# Patient Record
Sex: Female | Born: 1935 | Race: White | Hispanic: No | Smoking: Never smoker
Health system: Southern US, Community
[De-identification: ages and names within clinical notes are randomized; demographics above are authoritative.]

## PROBLEM LIST (undated history)

## (undated) DIAGNOSIS — M1990 Unspecified osteoarthritis, unspecified site: Secondary | ICD-10-CM

## (undated) DIAGNOSIS — K219 Gastro-esophageal reflux disease without esophagitis: Secondary | ICD-10-CM

## (undated) DIAGNOSIS — H3530 Unspecified macular degeneration: Secondary | ICD-10-CM

## (undated) DIAGNOSIS — G709 Myoneural disorder, unspecified: Secondary | ICD-10-CM

## (undated) DIAGNOSIS — N816 Rectocele: Secondary | ICD-10-CM

## (undated) DIAGNOSIS — R011 Cardiac murmur, unspecified: Secondary | ICD-10-CM

## (undated) DIAGNOSIS — N814 Uterovaginal prolapse, unspecified: Secondary | ICD-10-CM

## (undated) DIAGNOSIS — N3010 Interstitial cystitis (chronic) without hematuria: Secondary | ICD-10-CM

## (undated) DIAGNOSIS — M419 Scoliosis, unspecified: Secondary | ICD-10-CM

## (undated) DIAGNOSIS — IMO0002 Reserved for concepts with insufficient information to code with codable children: Secondary | ICD-10-CM

## (undated) DIAGNOSIS — E7800 Pure hypercholesterolemia, unspecified: Secondary | ICD-10-CM

## (undated) DIAGNOSIS — Z96641 Presence of right artificial hip joint: Secondary | ICD-10-CM

## (undated) DIAGNOSIS — I2510 Atherosclerotic heart disease of native coronary artery without angina pectoris: Secondary | ICD-10-CM

## (undated) DIAGNOSIS — E079 Disorder of thyroid, unspecified: Secondary | ICD-10-CM

## (undated) DIAGNOSIS — H35039 Hypertensive retinopathy, unspecified eye: Secondary | ICD-10-CM

## (undated) DIAGNOSIS — R001 Bradycardia, unspecified: Secondary | ICD-10-CM

## (undated) DIAGNOSIS — E039 Hypothyroidism, unspecified: Secondary | ICD-10-CM

## (undated) DIAGNOSIS — I214 Non-ST elevation (NSTEMI) myocardial infarction: Secondary | ICD-10-CM

## (undated) HISTORY — DX: Gastro-esophageal reflux disease without esophagitis: K21.9

## (undated) HISTORY — DX: Hypertensive retinopathy, unspecified eye: H35.039

## (undated) HISTORY — DX: Pure hypercholesterolemia, unspecified: E78.00

## (undated) HISTORY — PX: EYE SURGERY: SHX253

## (undated) HISTORY — DX: Disorder of thyroid, unspecified: E07.9

## (undated) HISTORY — DX: Non-ST elevation (NSTEMI) myocardial infarction: I21.4

## (undated) HISTORY — DX: Interstitial cystitis (chronic) without hematuria: N30.10

## (undated) HISTORY — DX: Uterovaginal prolapse, unspecified: N81.4

## (undated) HISTORY — DX: Unspecified macular degeneration: H35.30

## (undated) HISTORY — DX: Rectocele: N81.6

## (undated) HISTORY — DX: Presence of right artificial hip joint: Z96.641

## (undated) HISTORY — DX: Unspecified osteoarthritis, unspecified site: M19.90

## (undated) HISTORY — DX: Atherosclerotic heart disease of native coronary artery without angina pectoris: I25.10

## (undated) HISTORY — DX: Bradycardia, unspecified: R00.1

## (undated) HISTORY — DX: Scoliosis, unspecified: M41.9

## (undated) HISTORY — DX: Reserved for concepts with insufficient information to code with codable children: IMO0002

---

## 1998-10-22 ENCOUNTER — Other Ambulatory Visit: Admission: RE | Admit: 1998-10-22 | Discharge: 1998-10-22 | Payer: Self-pay | Admitting: Obstetrics and Gynecology

## 1999-10-22 ENCOUNTER — Other Ambulatory Visit: Admission: RE | Admit: 1999-10-22 | Discharge: 1999-10-22 | Payer: Self-pay | Admitting: Obstetrics and Gynecology

## 2000-04-04 ENCOUNTER — Emergency Department (HOSPITAL_COMMUNITY): Admission: EM | Admit: 2000-04-04 | Discharge: 2000-04-04 | Payer: Self-pay | Admitting: Emergency Medicine

## 2000-04-06 ENCOUNTER — Other Ambulatory Visit: Admission: RE | Admit: 2000-04-06 | Discharge: 2000-04-06 | Payer: Self-pay | Admitting: Obstetrics and Gynecology

## 2000-04-15 ENCOUNTER — Emergency Department (HOSPITAL_COMMUNITY): Admission: EM | Admit: 2000-04-15 | Discharge: 2000-04-15 | Payer: Self-pay | Admitting: Emergency Medicine

## 2000-11-04 ENCOUNTER — Other Ambulatory Visit: Admission: RE | Admit: 2000-11-04 | Discharge: 2000-11-04 | Payer: Self-pay | Admitting: Obstetrics and Gynecology

## 2001-06-23 ENCOUNTER — Encounter: Admission: RE | Admit: 2001-06-23 | Discharge: 2001-07-15 | Payer: Self-pay | Admitting: Internal Medicine

## 2001-11-04 ENCOUNTER — Other Ambulatory Visit: Admission: RE | Admit: 2001-11-04 | Discharge: 2001-11-04 | Payer: Self-pay | Admitting: Obstetrics and Gynecology

## 2001-11-07 ENCOUNTER — Other Ambulatory Visit: Admission: RE | Admit: 2001-11-07 | Discharge: 2001-11-07 | Payer: Self-pay | Admitting: Obstetrics and Gynecology

## 2001-11-08 ENCOUNTER — Encounter: Payer: Self-pay | Admitting: Family Medicine

## 2002-12-11 ENCOUNTER — Other Ambulatory Visit: Admission: RE | Admit: 2002-12-11 | Discharge: 2002-12-11 | Payer: Self-pay | Admitting: Obstetrics and Gynecology

## 2003-06-13 ENCOUNTER — Encounter: Admission: RE | Admit: 2003-06-13 | Discharge: 2003-06-13 | Payer: Self-pay | Admitting: Family Medicine

## 2003-07-23 ENCOUNTER — Encounter: Payer: Self-pay | Admitting: Internal Medicine

## 2004-10-31 ENCOUNTER — Ambulatory Visit: Payer: Self-pay | Admitting: Internal Medicine

## 2005-01-03 ENCOUNTER — Emergency Department (HOSPITAL_COMMUNITY): Admission: EM | Admit: 2005-01-03 | Discharge: 2005-01-03 | Payer: Self-pay | Admitting: *Deleted

## 2005-03-12 ENCOUNTER — Other Ambulatory Visit: Admission: RE | Admit: 2005-03-12 | Discharge: 2005-03-12 | Payer: Self-pay | Admitting: Obstetrics and Gynecology

## 2005-05-18 ENCOUNTER — Ambulatory Visit: Payer: Self-pay | Admitting: Internal Medicine

## 2005-06-01 ENCOUNTER — Ambulatory Visit: Payer: Self-pay | Admitting: Pulmonary Disease

## 2005-06-02 ENCOUNTER — Ambulatory Visit: Payer: Self-pay | Admitting: Pulmonary Disease

## 2005-09-02 ENCOUNTER — Ambulatory Visit: Payer: Self-pay | Admitting: Pulmonary Disease

## 2005-12-31 ENCOUNTER — Ambulatory Visit: Payer: Self-pay | Admitting: Internal Medicine

## 2006-01-28 ENCOUNTER — Encounter: Payer: Self-pay | Admitting: Internal Medicine

## 2006-04-15 ENCOUNTER — Ambulatory Visit: Payer: Self-pay | Admitting: Gastroenterology

## 2006-04-26 ENCOUNTER — Encounter: Payer: Self-pay | Admitting: Internal Medicine

## 2006-04-26 ENCOUNTER — Ambulatory Visit: Payer: Self-pay | Admitting: Gastroenterology

## 2006-05-03 ENCOUNTER — Ambulatory Visit: Payer: Self-pay | Admitting: Internal Medicine

## 2006-05-05 ENCOUNTER — Other Ambulatory Visit: Admission: RE | Admit: 2006-05-05 | Discharge: 2006-05-05 | Payer: Self-pay | Admitting: Obstetrics and Gynecology

## 2006-06-09 ENCOUNTER — Ambulatory Visit: Payer: Self-pay | Admitting: Internal Medicine

## 2007-01-14 ENCOUNTER — Encounter: Payer: Self-pay | Admitting: Internal Medicine

## 2007-01-19 ENCOUNTER — Ambulatory Visit: Payer: Self-pay | Admitting: Internal Medicine

## 2007-01-19 LAB — CONVERTED CEMR LAB: TSH: 2.35 microintl units/mL (ref 0.35–5.50)

## 2007-01-24 ENCOUNTER — Encounter: Payer: Self-pay | Admitting: Internal Medicine

## 2007-05-04 ENCOUNTER — Ambulatory Visit: Payer: Self-pay | Admitting: Internal Medicine

## 2007-05-04 DIAGNOSIS — L258 Unspecified contact dermatitis due to other agents: Secondary | ICD-10-CM

## 2007-06-08 DIAGNOSIS — E039 Hypothyroidism, unspecified: Secondary | ICD-10-CM | POA: Insufficient documentation

## 2007-06-08 DIAGNOSIS — I059 Rheumatic mitral valve disease, unspecified: Secondary | ICD-10-CM | POA: Insufficient documentation

## 2007-08-11 ENCOUNTER — Ambulatory Visit: Payer: Self-pay | Admitting: Internal Medicine

## 2007-08-11 DIAGNOSIS — R51 Headache: Secondary | ICD-10-CM

## 2007-08-11 DIAGNOSIS — R519 Headache, unspecified: Secondary | ICD-10-CM | POA: Insufficient documentation

## 2007-08-11 DIAGNOSIS — J309 Allergic rhinitis, unspecified: Secondary | ICD-10-CM | POA: Insufficient documentation

## 2007-08-11 DIAGNOSIS — I10 Essential (primary) hypertension: Secondary | ICD-10-CM | POA: Insufficient documentation

## 2007-08-11 LAB — CONVERTED CEMR LAB
CRP, High Sensitivity: 2 (ref 0.00–5.00)
TSH: 2.02 microintl units/mL (ref 0.35–5.50)

## 2007-09-13 ENCOUNTER — Ambulatory Visit: Payer: Self-pay | Admitting: Internal Medicine

## 2007-11-14 ENCOUNTER — Ambulatory Visit: Payer: Self-pay | Admitting: Internal Medicine

## 2007-12-19 ENCOUNTER — Telehealth: Payer: Self-pay | Admitting: Internal Medicine

## 2008-02-10 ENCOUNTER — Telehealth: Payer: Self-pay | Admitting: Internal Medicine

## 2008-03-15 ENCOUNTER — Ambulatory Visit: Payer: Self-pay | Admitting: Internal Medicine

## 2008-03-15 LAB — CONVERTED CEMR LAB
ALT: 15 units/L (ref 0–35)
AST: 19 units/L (ref 0–37)
Albumin: 3.7 g/dL (ref 3.5–5.2)
Alkaline Phosphatase: 67 units/L (ref 39–117)
BUN: 16 mg/dL (ref 6–23)
Basophils Absolute: 0 10*3/uL (ref 0.0–0.1)
Basophils Relative: 0.6 % (ref 0.0–3.0)
Bilirubin, Direct: 0.1 mg/dL (ref 0.0–0.3)
CO2: 32 meq/L (ref 19–32)
Calcium: 8.9 mg/dL (ref 8.4–10.5)
Chloride: 107 meq/L (ref 96–112)
Cholesterol: 197 mg/dL (ref 0–200)
Creatinine, Ser: 0.7 mg/dL (ref 0.4–1.2)
Eosinophils Absolute: 0.1 10*3/uL (ref 0.0–0.7)
Eosinophils Relative: 1.6 % (ref 0.0–5.0)
GFR calc Af Amer: 106 mL/min
GFR calc non Af Amer: 88 mL/min
Glucose, Bld: 94 mg/dL (ref 70–99)
HCT: 42.1 % (ref 36.0–46.0)
HDL: 44.9 mg/dL (ref >39.0)
Hemoglobin: 14.3 g/dL (ref 12.0–15.0)
LDL Cholesterol: 123 mg/dL — ABNORMAL HIGH (ref 0–99)
Lymphocytes Relative: 24.4 % (ref 12.0–46.0)
MCHC: 34 g/dL (ref 30.0–36.0)
MCV: 91.2 fL (ref 78.0–100.0)
Monocytes Absolute: 0.4 10*3/uL (ref 0.1–1.0)
Monocytes Relative: 9.4 % (ref 3.0–12.0)
Neutro Abs: 2.4 10*3/uL (ref 1.4–7.7)
Neutrophils Relative %: 64 % (ref 43.0–77.0)
Platelets: 247 10*3/uL (ref 150–400)
Potassium: 3.8 meq/L (ref 3.5–5.1)
RBC: 4.62 M/uL (ref 3.87–5.11)
RDW: 11.8 % (ref 11.5–14.6)
Sodium: 143 meq/L (ref 135–145)
TSH: 1.27 microintl units/mL (ref 0.35–5.50)
Total Bilirubin: 0.7 mg/dL (ref 0.3–1.2)
Total CHOL/HDL Ratio: 4.4
Total Protein: 6.6 g/dL (ref 6.0–8.3)
Triglycerides: 144 mg/dL (ref 0–149)
VLDL: 29 mg/dL (ref 0–40)
WBC: 3.9 10*3/uL — ABNORMAL LOW (ref 4.5–10.5)

## 2008-04-03 ENCOUNTER — Telehealth: Payer: Self-pay | Admitting: Internal Medicine

## 2008-04-04 ENCOUNTER — Ambulatory Visit: Payer: Self-pay | Admitting: Internal Medicine

## 2008-04-04 DIAGNOSIS — M19049 Primary osteoarthritis, unspecified hand: Secondary | ICD-10-CM | POA: Insufficient documentation

## 2008-04-19 ENCOUNTER — Telehealth: Payer: Self-pay | Admitting: Internal Medicine

## 2008-05-02 ENCOUNTER — Encounter: Payer: Self-pay | Admitting: Internal Medicine

## 2008-05-09 ENCOUNTER — Ambulatory Visit: Payer: Self-pay | Admitting: Obstetrics and Gynecology

## 2008-05-09 ENCOUNTER — Other Ambulatory Visit: Admission: RE | Admit: 2008-05-09 | Discharge: 2008-05-09 | Payer: Self-pay | Admitting: Obstetrics and Gynecology

## 2008-05-09 ENCOUNTER — Encounter: Payer: Self-pay | Admitting: Obstetrics and Gynecology

## 2008-06-06 ENCOUNTER — Ambulatory Visit: Payer: Self-pay | Admitting: Obstetrics and Gynecology

## 2008-06-12 ENCOUNTER — Encounter: Payer: Self-pay | Admitting: Obstetrics and Gynecology

## 2008-06-12 ENCOUNTER — Ambulatory Visit: Payer: Self-pay | Admitting: Obstetrics and Gynecology

## 2008-06-12 ENCOUNTER — Ambulatory Visit (HOSPITAL_BASED_OUTPATIENT_CLINIC_OR_DEPARTMENT_OTHER): Admission: RE | Admit: 2008-06-12 | Discharge: 2008-06-13 | Payer: Self-pay | Admitting: Obstetrics and Gynecology

## 2008-06-26 ENCOUNTER — Ambulatory Visit: Payer: Self-pay | Admitting: Obstetrics and Gynecology

## 2008-07-23 ENCOUNTER — Ambulatory Visit: Payer: Self-pay | Admitting: Obstetrics and Gynecology

## 2008-10-25 ENCOUNTER — Ambulatory Visit: Payer: Self-pay | Admitting: Internal Medicine

## 2008-10-25 DIAGNOSIS — J321 Chronic frontal sinusitis: Secondary | ICD-10-CM

## 2008-10-25 LAB — CONVERTED CEMR LAB
BUN: 17 mg/dL (ref 6–23)
Basophils Absolute: 0 10*3/uL (ref 0.0–0.1)
Basophils Relative: 0.9 % (ref 0.0–3.0)
CO2: 31 meq/L (ref 19–32)
Calcium: 9.4 mg/dL (ref 8.4–10.5)
Chloride: 106 meq/L (ref 96–112)
Creatinine, Ser: 0.6 mg/dL (ref 0.4–1.2)
Eosinophils Absolute: 0.1 10*3/uL (ref 0.0–0.7)
Eosinophils Relative: 1.7 % (ref 0.0–5.0)
GFR calc non Af Amer: 104.57 mL/min (ref >60)
Glucose, Bld: 99 mg/dL (ref 70–99)
HCT: 41.7 % (ref 36.0–46.0)
Hemoglobin: 14.2 g/dL (ref 12.0–15.0)
Lymphocytes Relative: 23.2 % (ref 12.0–46.0)
Lymphs Abs: 0.9 10*3/uL (ref 0.7–4.0)
MCHC: 34.1 g/dL (ref 30.0–36.0)
MCV: 91 fL (ref 78.0–100.0)
Monocytes Absolute: 0.3 10*3/uL (ref 0.1–1.0)
Monocytes Relative: 8.4 % (ref 3.0–12.0)
Neutro Abs: 2.6 10*3/uL (ref 1.4–7.7)
Neutrophils Relative %: 65.8 % (ref 43.0–77.0)
Platelets: 227 10*3/uL (ref 150.0–400.0)
Potassium: 4.5 meq/L (ref 3.5–5.1)
RBC: 4.58 M/uL (ref 3.87–5.11)
RDW: 12.3 % (ref 11.5–14.6)
Sodium: 142 meq/L (ref 135–145)
TSH: 1.69 microintl units/mL (ref 0.35–5.50)
WBC: 3.9 10*3/uL — ABNORMAL LOW (ref 4.5–10.5)

## 2008-10-26 ENCOUNTER — Ambulatory Visit: Payer: Self-pay | Admitting: Cardiology

## 2009-01-09 ENCOUNTER — Ambulatory Visit: Payer: Self-pay | Admitting: Obstetrics and Gynecology

## 2009-01-23 ENCOUNTER — Ambulatory Visit: Payer: Self-pay | Admitting: Internal Medicine

## 2009-01-24 ENCOUNTER — Ambulatory Visit: Payer: Self-pay | Admitting: Internal Medicine

## 2009-01-25 ENCOUNTER — Telehealth: Payer: Self-pay | Admitting: Internal Medicine

## 2009-02-10 ENCOUNTER — Emergency Department (HOSPITAL_COMMUNITY): Admission: EM | Admit: 2009-02-10 | Discharge: 2009-02-11 | Payer: Self-pay | Admitting: Emergency Medicine

## 2009-02-11 ENCOUNTER — Telehealth: Payer: Self-pay | Admitting: Internal Medicine

## 2009-03-21 ENCOUNTER — Ambulatory Visit: Payer: Self-pay | Admitting: Women's Health

## 2009-05-14 ENCOUNTER — Ambulatory Visit: Payer: Self-pay | Admitting: Obstetrics and Gynecology

## 2009-07-23 ENCOUNTER — Ambulatory Visit: Payer: Self-pay | Admitting: Internal Medicine

## 2009-07-30 ENCOUNTER — Encounter: Admission: RE | Admit: 2009-07-30 | Discharge: 2009-07-30 | Payer: Self-pay | Admitting: Internal Medicine

## 2009-08-03 HISTORY — PX: OOPHORECTOMY: SHX86

## 2009-08-03 HISTORY — PX: BLADDER SUSPENSION: SHX72

## 2009-08-03 HISTORY — PX: VAGINAL HYSTERECTOMY: SUR661

## 2009-08-20 ENCOUNTER — Telehealth: Payer: Self-pay | Admitting: Internal Medicine

## 2009-08-30 ENCOUNTER — Encounter: Payer: Self-pay | Admitting: Internal Medicine

## 2010-01-10 ENCOUNTER — Encounter: Payer: Self-pay | Admitting: Internal Medicine

## 2010-03-20 ENCOUNTER — Observation Stay (HOSPITAL_COMMUNITY): Admission: EM | Admit: 2010-03-20 | Discharge: 2010-03-21 | Payer: Self-pay | Admitting: Emergency Medicine

## 2010-03-20 ENCOUNTER — Encounter (INDEPENDENT_AMBULATORY_CARE_PROVIDER_SITE_OTHER): Payer: Self-pay | Admitting: Internal Medicine

## 2010-04-02 ENCOUNTER — Telehealth (INDEPENDENT_AMBULATORY_CARE_PROVIDER_SITE_OTHER): Payer: Self-pay | Admitting: *Deleted

## 2010-04-02 ENCOUNTER — Encounter: Payer: Self-pay | Admitting: Internal Medicine

## 2010-04-03 ENCOUNTER — Ambulatory Visit: Payer: Self-pay

## 2010-04-03 ENCOUNTER — Encounter: Payer: Self-pay | Admitting: Cardiology

## 2010-04-03 ENCOUNTER — Ambulatory Visit (HOSPITAL_COMMUNITY): Admission: RE | Admit: 2010-04-03 | Discharge: 2010-04-03 | Payer: Self-pay | Admitting: Cardiology

## 2010-04-03 ENCOUNTER — Ambulatory Visit: Payer: Self-pay | Admitting: Cardiology

## 2010-04-15 ENCOUNTER — Encounter: Payer: Self-pay | Admitting: Internal Medicine

## 2010-04-18 ENCOUNTER — Ambulatory Visit: Payer: Self-pay

## 2010-04-18 ENCOUNTER — Ambulatory Visit: Payer: Self-pay | Admitting: Cardiology

## 2010-04-18 DIAGNOSIS — R9431 Abnormal electrocardiogram [ECG] [EKG]: Secondary | ICD-10-CM | POA: Insufficient documentation

## 2010-04-18 LAB — CONVERTED CEMR LAB
BUN: 16 mg/dL (ref 6–23)
Basophils Absolute: 0 10*3/uL (ref 0.0–0.1)
Basophils Relative: 0 % (ref 0–1)
CO2: 27 meq/L (ref 19–32)
Calcium: 9.1 mg/dL (ref 8.4–10.5)
Chloride: 104 meq/L (ref 96–112)
Creatinine, Ser: 0.67 mg/dL (ref 0.40–1.20)
Eosinophils Absolute: 0.1 10*3/uL (ref 0.0–0.7)
Eosinophils Relative: 1 % (ref 0–5)
Glucose, Bld: 137 mg/dL — ABNORMAL HIGH (ref 70–99)
HCT: 39.5 % (ref 36.0–46.0)
Hemoglobin: 13.1 g/dL (ref 12.0–15.0)
INR: 0.88 (ref <1.50)
Lymphocytes Relative: 24 % (ref 12–46)
Lymphs Abs: 1.1 10*3/uL (ref 0.7–4.0)
MCHC: 33.2 g/dL (ref 30.0–36.0)
MCV: 91.9 fL (ref 78.0–100.0)
Monocytes Absolute: 0.3 10*3/uL (ref 0.1–1.0)
Monocytes Relative: 8 % (ref 3–12)
Neutro Abs: 3 10*3/uL (ref 1.7–7.7)
Neutrophils Relative %: 67 % (ref 43–77)
Platelets: 245 10*3/uL (ref 150–400)
Potassium: 4.2 meq/L (ref 3.5–5.3)
Prothrombin Time: 12.1 s (ref 11.6–15.2)
RBC: 4.3 M/uL (ref 3.87–5.11)
RDW: 12.2 % (ref 11.5–15.5)
Sodium: 138 meq/L (ref 135–145)
WBC: 4.5 10*3/uL (ref 4.0–10.5)
aPTT: 29 s (ref 24–37)

## 2010-04-21 ENCOUNTER — Ambulatory Visit: Payer: Self-pay | Admitting: Cardiology

## 2010-04-21 ENCOUNTER — Inpatient Hospital Stay (HOSPITAL_BASED_OUTPATIENT_CLINIC_OR_DEPARTMENT_OTHER): Admission: RE | Admit: 2010-04-21 | Discharge: 2010-04-21 | Payer: Self-pay | Admitting: Cardiology

## 2010-04-21 HISTORY — PX: CORONARY ANGIOPLASTY WITH STENT PLACEMENT: SHX49

## 2010-04-25 ENCOUNTER — Ambulatory Visit: Payer: Self-pay | Admitting: Cardiology

## 2010-04-25 ENCOUNTER — Ambulatory Visit (HOSPITAL_COMMUNITY): Admission: RE | Admit: 2010-04-25 | Discharge: 2010-04-26 | Payer: Self-pay | Admitting: Cardiology

## 2010-04-26 ENCOUNTER — Encounter: Payer: Self-pay | Admitting: Cardiology

## 2010-04-26 ENCOUNTER — Ambulatory Visit: Payer: Self-pay | Admitting: Vascular Surgery

## 2010-04-29 ENCOUNTER — Telehealth: Payer: Self-pay | Admitting: Cardiology

## 2010-05-01 ENCOUNTER — Telehealth: Payer: Self-pay | Admitting: Cardiology

## 2010-05-01 DIAGNOSIS — M79609 Pain in unspecified limb: Secondary | ICD-10-CM | POA: Insufficient documentation

## 2010-05-02 ENCOUNTER — Encounter: Payer: Self-pay | Admitting: Cardiology

## 2010-05-02 ENCOUNTER — Ambulatory Visit: Payer: Self-pay

## 2010-05-05 ENCOUNTER — Encounter: Admission: RE | Admit: 2010-05-05 | Discharge: 2010-05-05 | Payer: Self-pay | Admitting: Cardiology

## 2010-05-09 ENCOUNTER — Ambulatory Visit: Payer: Self-pay | Admitting: Cardiology

## 2010-05-12 ENCOUNTER — Ambulatory Visit: Payer: Self-pay | Admitting: Cardiology

## 2010-05-13 ENCOUNTER — Telehealth: Payer: Self-pay | Admitting: Cardiology

## 2010-05-15 ENCOUNTER — Encounter (HOSPITAL_COMMUNITY)
Admission: RE | Admit: 2010-05-15 | Discharge: 2010-08-13 | Payer: Self-pay | Source: Home / Self Care | Attending: Cardiology | Admitting: Cardiology

## 2010-05-15 LAB — CONVERTED CEMR LAB
ALT: 26 units/L (ref 0–35)
AST: 25 units/L (ref 0–37)
Albumin: 3.8 g/dL (ref 3.5–5.2)
Alkaline Phosphatase: 73 units/L (ref 39–117)
Bilirubin, Direct: 0.1 mg/dL (ref 0.0–0.3)
Cholesterol: 137 mg/dL (ref 0–200)
HDL: 36.4 mg/dL — ABNORMAL LOW (ref >39.00)
LDL Cholesterol: 74 mg/dL (ref 0–99)
Total Bilirubin: 0.5 mg/dL (ref 0.3–1.2)
Total CHOL/HDL Ratio: 4
Total Protein: 6.2 g/dL (ref 6.0–8.3)
Triglycerides: 133 mg/dL (ref 0.0–149.0)
VLDL: 26.6 mg/dL (ref 0.0–40.0)

## 2010-05-23 ENCOUNTER — Encounter: Payer: Self-pay | Admitting: Cardiology

## 2010-05-24 ENCOUNTER — Encounter: Payer: Self-pay | Admitting: Cardiology

## 2010-05-26 ENCOUNTER — Ambulatory Visit: Payer: Self-pay | Admitting: Cardiology

## 2010-05-26 ENCOUNTER — Encounter: Payer: Self-pay | Admitting: Cardiology

## 2010-06-03 ENCOUNTER — Telehealth: Payer: Self-pay | Admitting: Internal Medicine

## 2010-06-17 ENCOUNTER — Other Ambulatory Visit: Admission: RE | Admit: 2010-06-17 | Discharge: 2010-06-17 | Payer: Self-pay | Admitting: Obstetrics and Gynecology

## 2010-06-17 ENCOUNTER — Ambulatory Visit: Payer: Self-pay | Admitting: Obstetrics and Gynecology

## 2010-06-23 ENCOUNTER — Telehealth: Payer: Self-pay | Admitting: Internal Medicine

## 2010-06-23 ENCOUNTER — Ambulatory Visit: Payer: Self-pay | Admitting: Internal Medicine

## 2010-06-23 ENCOUNTER — Ambulatory Visit: Payer: Self-pay | Admitting: Cardiology

## 2010-07-01 ENCOUNTER — Ambulatory Visit: Payer: Self-pay | Admitting: Internal Medicine

## 2010-07-01 LAB — CONVERTED CEMR LAB
Calcium, Total (PTH): 9 mg/dL (ref 8.4–10.5)
HDL goal, serum: 40 mg/dL
PTH: 46 pg/mL (ref 14.0–72.0)
T3, Free: 2.8 pg/mL (ref 2.3–4.2)
T4, Total: 8.7 ug/dL (ref 5.0–12.5)
Vit D, 25-Hydroxy: 45 ng/mL (ref 30–89)

## 2010-07-04 LAB — CONVERTED CEMR LAB: TSH: 1.04 microintl units/mL (ref 0.35–5.50)

## 2010-07-07 LAB — CONVERTED CEMR LAB
ALT: 17 units/L (ref 0–35)
AST: 24 units/L (ref 0–37)
Albumin: 4 g/dL (ref 3.5–5.2)
Alkaline Phosphatase: 67 units/L (ref 39–117)
Bilirubin, Direct: 0.1 mg/dL (ref 0.0–0.3)
Calcium: 9.1 mg/dL (ref 8.4–10.5)
Cholesterol: 140 mg/dL (ref 0–200)
HDL: 46.7 mg/dL (ref >39.00)
LDL Cholesterol: 72 mg/dL (ref 0–99)
Total Bilirubin: 0.5 mg/dL (ref 0.3–1.2)
Total CHOL/HDL Ratio: 3
Total Protein: 6.1 g/dL (ref 6.0–8.3)
Triglycerides: 107 mg/dL (ref 0.0–149.0)
VLDL: 21.4 mg/dL (ref 0.0–40.0)

## 2010-07-09 ENCOUNTER — Encounter: Payer: Self-pay | Admitting: Cardiology

## 2010-07-15 ENCOUNTER — Ambulatory Visit: Payer: Self-pay | Admitting: Internal Medicine

## 2010-08-12 ENCOUNTER — Ambulatory Visit
Admission: RE | Admit: 2010-08-12 | Discharge: 2010-08-12 | Payer: Self-pay | Source: Home / Self Care | Attending: Cardiology | Admitting: Cardiology

## 2010-08-14 ENCOUNTER — Encounter (HOSPITAL_COMMUNITY)
Admission: RE | Admit: 2010-08-14 | Discharge: 2010-09-02 | Payer: Self-pay | Source: Home / Self Care | Attending: Cardiology | Admitting: Cardiology

## 2010-08-26 ENCOUNTER — Telehealth: Payer: Self-pay | Admitting: Internal Medicine

## 2010-08-29 ENCOUNTER — Ambulatory Visit (INDEPENDENT_AMBULATORY_CARE_PROVIDER_SITE_OTHER)
Admission: RE | Admit: 2010-08-29 | Discharge: 2010-08-29 | Payer: Self-pay | Source: Home / Self Care | Attending: Internal Medicine | Admitting: Internal Medicine

## 2010-08-29 DIAGNOSIS — I10 Essential (primary) hypertension: Secondary | ICD-10-CM

## 2010-09-03 ENCOUNTER — Encounter (HOSPITAL_COMMUNITY): Payer: Medicare Other | Attending: Cardiology

## 2010-09-03 DIAGNOSIS — I209 Angina pectoris, unspecified: Secondary | ICD-10-CM | POA: Insufficient documentation

## 2010-09-03 DIAGNOSIS — E039 Hypothyroidism, unspecified: Secondary | ICD-10-CM | POA: Insufficient documentation

## 2010-09-03 DIAGNOSIS — Z7982 Long term (current) use of aspirin: Secondary | ICD-10-CM | POA: Insufficient documentation

## 2010-09-03 DIAGNOSIS — Z9861 Coronary angioplasty status: Secondary | ICD-10-CM | POA: Insufficient documentation

## 2010-09-03 DIAGNOSIS — Z5189 Encounter for other specified aftercare: Secondary | ICD-10-CM | POA: Insufficient documentation

## 2010-09-03 DIAGNOSIS — I2510 Atherosclerotic heart disease of native coronary artery without angina pectoris: Secondary | ICD-10-CM | POA: Insufficient documentation

## 2010-09-04 NOTE — Assessment & Plan Note (Signed)
Summary: per check out/sf   Visit Type:  Follow-up Primary Provider:  Stacie Glaze MD  CC:  CAD.  History of Present Illness: The patient presents for  followup of her coronary disease. Since I last I last saw her she has had no new cardiovascular complaints. She denies chest pressure, neck or arm discomfort. She has had no palpitations, presyncope or syncope. She has had no PND or orthopnea. She continues to participate in cardiac rehabilitation.  She did have one episode of a sharp discomfort one day after being active all day long and drinking heavily caffeinated beverage. She dictated nitroglycerin but she's had no recurrence of this.  Current Medications (verified): 1)  Synthroid 50 Mcg  Tabs (Levothyroxine Sodium) .Marland Kitchen.. 1 Once Daily 2)  Aspirin 81 Mg  Tabs (Aspirin) .Marland Kitchen.. 1 By Mouth Daily 3)  Eq Saline Nasal Spray 0.65 % Soln (Saline) .... 4-5 Times Per Day 4)  Hydrocodone-Acetaminophen 7.5-500 Mg Tabs (Hydrocodone-Acetaminophen) .... Take 1 Tab Every 4-6 Hours As Necessary For Pain 5)  Plavix 75 Mg Tabs (Clopidogrel Bisulfate) .Marland Kitchen.. 1 By Mouth Daily 6)  Simvastatin 40 Mg Tabs (Simvastatin) .Marland Kitchen.. 1 By Mouth Daily  Allergies (verified): No Known Drug Allergies  Past History:  Past Medical History: Reviewed history from 05/26/2010 and no changes required. Allergic rhinitis Hypertension (She reports it is borderline and it is not currently treated) Hypothyroidism  CAD (April 25, 2010, and underwent successful PCI and stenting of the LAD with placement of a 2.5 x 18 mm Promus drug-eluting stent.)  Past Surgical History: Reviewed history from 04/18/2010 and no changes required. Hysterectomy.  Pelvic floor repair for cystocele and rectocele.      Review of Systems       As stated in the HPI and negative for all other systems.   Vital Signs:  Patient profile:   76 year old female Height:      65 inches Weight:      128 pounds BMI:     21.38 Pulse rate:   64 /  minute Resp:     16 per minute BP sitting:   149 / 69  (right arm)  Vitals Entered By: Marrion Coy, CNA (August 12, 2010 10:10 AM)  Physical Exam  General:  Well developed, well nourished, in no acute distress. Head:  normocephalic and atraumatic Mouth:  Teeth, gums and palate normal. Oral mucosa normal. Neck:  Neck supple, no JVD. No masses, thyromegaly or abnormal cervical nodes. Chest Wall:  no deformities or breast masses noted Lungs:  Clear bilaterally to auscultation and percussion. Abdomen:  soft and non-tender.   Msk:  Lordosis Extremities:  No clubbing or cyanosis. Neurologic:  Alert and oriented x 3. Skin:  Intact without lesions or rashes. Cervical Nodes:  no significant adenopathy Inguinal Nodes:  no significant adenopathy Psych:  Normal affect.   Detailed Cardiovascular Exam  Neck    Carotids: Carotids full and equal bilaterally without bruits.      Neck Veins: Normal, no JVD.    Heart    Inspection: no deformities or lifts noted.      Palpation: normal PMI with no thrills palpable.      Auscultation: regular rate and rhythm, S1, S2 without murmurs, rubs, gallops, or clicks.    Vascular    Abdominal Aorta: no palpable masses, pulsations, or audible bruits.      Femoral Pulses: normal femoral pulses bilaterally.      Pedal Pulses: normal pedal pulses bilaterally.  Radial Pulses: normal radial pulses bilaterally.      Peripheral Circulation: no clubbing, cyanosis, or edema noted with normal capillary refill.     Impression & Recommendations:  Problem # 1:  CAD, NATIVE VESSEL (ICD-414.01) The patient is having no new cardiovascular complaints. She is participating aggressively and risk reduction. No change in therapy is indicated.  Problem # 2:  MIXED HYPERLIPIDEMIA (ICD-272.2) She had excellent lipid profile in November and she will continue on the meds as listed.  Problem # 3:  HYPERTENSION (ICD-401.9) Her blood pressure is very slightly elevated  but this is unusual. She will continue the meds as listed.  Patient Instructions: 1)  Your physician recommends that you schedule a follow-up appointment in: 1 YEAR WITH DR [facility] 2)  Your physician recommends that you continue on your current medications as directed. Please refer to the Current Medication list given to you today.

## 2010-09-04 NOTE — Progress Notes (Signed)
Summary: Nuc Pre-Procedure  Phone Note Outgoing Call Call back at [facility] Phone [phone]   Call placed by: Antionette Char RN,  April 02, 2010 3:01 PM Call placed to: Patient Reason for Call: Confirm/change Appt Summary of Call: Reviewed information on [employer] Information Sheet (see scanned document for further details).  Spoke with patient's husband.     Nuclear Med Background Indications for Stress Test: Evaluation for Ischemia, Post Hospital  Indications Comments: 03/21/10 Hospital- CP, R/o MI  History: Abnormal EKG, Myocardial Perfusion Study  History Comments: 03 MPS-Normal  Symptoms: Chest Pressure, SOB    Nuclear Pre-Procedure Cardiac Risk Factors: Hypertension Height (in): 65

## 2010-09-04 NOTE — Assessment & Plan Note (Signed)
Summary: REVIEW LABWORK, F/U ON THYROID // RS   Vital Signs:  Patient profile:   76 year old female Height:      65 inches Weight:      127 pounds BMI:     21.21 Temp:     98.2 degrees F oral Pulse rate:   68 / minute Resp:     14 per minute BP sitting:   134 / 80  (left arm)  Vitals Entered By: Willy Eddy, LPN (July 01, 2010 2:31 PM) CC: wants thyroid checked, Hypertension Management, Lipid Management Is Patient Diabetic? No   Primary Care Provider:  Stacie Glaze MD  CC:  wants thyroid checked, Hypertension Management, and Lipid Management.  History of Present Illness: at all cholesterol goals never smoked but has second hand smoke from husband for years blood presure was controlled due thyroid check, monitering of HTN  Hypertension History:      She denies headache, chest pain, palpitations, dyspnea with exertion, orthopnea, PND, peripheral edema, visual symptoms, neurologic problems, syncope, and side effects from treatment.        Positive major cardiovascular risk factors include female age 66 years old or older, hyperlipidemia, and hypertension.  Negative major cardiovascular risk factors include non-tobacco-user status.        Positive history for target organ damage include ASHD (either angina/prior MI/prior CABG).  Further assessment for target organ damage reveals no history of cardiac end-organ damage (CHF/LVH), stroke/TIA, peripheral vascular disease, renal insufficiency, or hypertensive retinopathy.    Lipid Management History:      Positive NCEP/ATP III risk factors include female age 12 years old or older, hypertension, and ASHD (either angina/prior MI/prior CABG).  Negative NCEP/ATP III risk factors include non-tobacco-user status, no prior stroke/TIA, no peripheral vascular disease, and no history of aortic aneurysm.      Preventive Screening-Counseling & Management  Alcohol-Tobacco     Smoking Status: never     Tobacco Counseling: not  indicated; no tobacco use  Problems Prior to Update: 1)  Osteoporosis  (ICD-733.00) 2)  Mixed Hyperlipidemia  (ICD-272.2) 3)  Cad, Native Vessel  (ICD-414.01) 4)  Arm Pain  (ICD-729.5) 5)  Abnormal Stress Electrocardiogram  (ICD-794.31) 6)  Back Pain, Lumbosacral, Chronic  (ICD-724.5) 7)  Chronic Frontal Sinusitis  (ICD-473.1) 8)  Loc Osteoarthros Not Spec Whether Prim/sec Hand  (ICD-715.34) 9)  Preventive Health Care  (ICD-V70.0) 10)  Headache  (ICD-784.0) 11)  Hypertension  (ICD-401.9) 12)  Allergic Rhinitis  (ICD-477.9) 13)  Hypothyroidism  (ICD-244.9) 14)  Mitral Valve Prolapse  (ICD-424.0) 15)  Dermatitis, Contact, Nec  (ICD-692.89)  Current Problems (verified): 1)  Mixed Hyperlipidemia  (ICD-272.2) 2)  Cad, Native Vessel  (ICD-414.01) 3)  Arm Pain  (ICD-729.5) 4)  Abnormal Stress Electrocardiogram  (ICD-794.31) 5)  Back Pain, Lumbosacral, Chronic  (ICD-724.5) 6)  Chronic Frontal Sinusitis  (ICD-473.1) 7)  Loc Osteoarthros Not Spec Whether Prim/sec Hand  (ICD-715.34) 8)  Preventive Health Care  (ICD-V70.0) 9)  Headache  (ICD-784.0) 10)  Hypertension  (ICD-401.9) 11)  Allergic Rhinitis  (ICD-477.9) 12)  Hypothyroidism  (ICD-244.9) 13)  Mitral Valve Prolapse  (ICD-424.0) 14)  Dermatitis, Contact, Nec  (ICD-692.89)  Medications Prior to Update: 1)  Synthroid 50 Mcg  Tabs (Levothyroxine Sodium) .Marland Kitchen.. 1 Once Daily 2)  Advil 200 Mg  Tabs (Ibuprofen) 3)  Buffered Aspirin 325 Mg  Tabs (Aspirin Buf(Cacarb-Mgcarb-Mgo)) .Marland Kitchen.. 1 By Mouth Daily 4)  Eq Saline Nasal Spray 0.65 % Soln (Saline) .... 4-5 Times Per Day  5)  Hydrocodone-Acetaminophen 7.5-500 Mg Tabs (Hydrocodone-Acetaminophen) .... Take 1 Tab Every 4-6 Hours As Necessary For Pain 6)  Plavix 75 Mg Tabs (Clopidogrel Bisulfate) .Marland Kitchen.. 1 By Mouth Daily 7)  Simvastatin 40 Mg Tabs (Simvastatin) .Marland Kitchen.. 1 By Mouth Daily  Current Medications (verified): 1)  Synthroid 50 Mcg  Tabs (Levothyroxine Sodium) .Marland Kitchen.. 1 Once Daily 2)  Advil 200  Mg  Tabs (Ibuprofen) 3)  Buffered Aspirin 325 Mg  Tabs (Aspirin Buf(Cacarb-Mgcarb-Mgo)) .Marland Kitchen.. 1 By Mouth Daily 4)  Eq Saline Nasal Spray 0.65 % Soln (Saline) .... 4-5 Times Per Day 5)  Hydrocodone-Acetaminophen 7.5-500 Mg Tabs (Hydrocodone-Acetaminophen) .... Take 1 Tab Every 4-6 Hours As Necessary For Pain 6)  Plavix 75 Mg Tabs (Clopidogrel Bisulfate) .Marland Kitchen.. 1 By Mouth Daily 7)  Simvastatin 40 Mg Tabs (Simvastatin) .Marland Kitchen.. 1 By Mouth Daily  Allergies (verified): No Known Drug Allergies  Past History:  Family History: Last updated: 04/22/10 Mother died at the age of 58 from arrhythmia.   She had a sister with stenting in her 96s.  Social History: Last updated: 04-22-2010 Married with 2 children Never Smoked  Risk Factors: Smoking Status: never (07/01/2010)  Past medical, surgical, family and social histories (including risk factors) reviewed, and no changes noted (except as noted below).  Past Medical History: Reviewed history from 05/26/2010 and no changes required. Allergic rhinitis Hypertension (She reports it is borderline and it is not currently treated) Hypothyroidism  CAD (April 25, 2010, and underwent successful PCI and stenting of the LAD with placement of a 2.5 x 18 mm Promus drug-eluting stent.)  Past Surgical History: Reviewed history from April 22, 2010 and no changes required. Hysterectomy.  Pelvic floor repair for cystocele and rectocele.      Family History: Reviewed history from 2010/04/22 and no changes required. Mother died at the age of 60 from arrhythmia.   She had a sister with stenting in her 69s.  Social History: Reviewed history from April 22, 2010 and no changes required. Married with 2 children Never Smoked  Review of Systems  The patient denies anorexia, fever, weight loss, weight gain, vision loss, decreased hearing, hoarseness, chest pain, syncope, dyspnea on exertion, peripheral edema, prolonged cough, headaches, hemoptysis, abdominal pain,  melena, hematochezia, severe indigestion/heartburn, hematuria, incontinence, genital sores, muscle weakness, suspicious skin lesions, transient blindness, difficulty walking, depression, unusual weight change, abnormal bleeding, enlarged lymph nodes, angioedema, and breast masses.    Physical Exam  General:  Well-developed,well-nourished,in no acute distress; alert,appropriate and cooperative throughout examination Head:  Normocephalic and atraumatic without obvious abnormalities. No apparent alopecia or balding. Eyes:  pupils equal and pupils round.   Ears:  R ear normal and L ear normal.   Neck:  No deformities, masses, or tenderness noted. Lungs:  normal respiratory effort.   Heart:  normal rate and regular rhythm.   Abdomen:  soft and non-tender.   Msk:  lumbar lordosis and SI joint tenderness.   Neurologic:  alert & oriented X3, gait normal, and DTRs symmetrical and normal.     Impression & Recommendations:  Problem # 1:  OSTEOPOROSIS (ICD-733.00) Assessment Unchanged  Orders: T-Parathyroid Hormone, Intact w/ Calcium (04540-98119) T-Vitamin D (25-Hydroxy) (14782-95621) Specimen Handling (30865)  Discussed medication use, applications of heat or ice, and exercises.   Problem # 2:  LOC OSTEOARTHROS NOT SPEC WHETHER PRIM/SEC HAND (ICD-715.34) Assessment: Deteriorated  persitnat hand pain Her updated medication list for this problem includes:    Advil 200 Mg Tabs (Ibuprofen)    Buffered Aspirin 325 Mg Tabs (Aspirin buf(cacarb-mgcarb-mgo)) .Marland KitchenMarland KitchenMarland KitchenMarland Kitchen 1  by mouth daily    Hydrocodone-acetaminophen 7.5-500 Mg Tabs (Hydrocodone-acetaminophen) .Marland Kitchen... Take 1 tab every 4-6 hours as necessary for pain  Discussed use of medications, application of heat or cold, and exercises.   Problem # 3:  HYPERTENSION (ICD-401.9)  BP today: 134/80 Prior BP: 122/82 (05/26/2010)  10 Yr Risk Heart Disease: N/A Prior 10 Yr Risk Heart Disease: 17 % (04/18/2010)  Labs Reviewed: K+: 4.2  (04/18/2010) Creat: : 0.67 (04/18/2010)   Chol: 140 (06/23/2010)   HDL: 46.70 (06/23/2010)   LDL: 72 (06/23/2010)   TG: 107.0 (06/23/2010)  Problem # 4:  ALLERGIC RHINITIS (ICD-477.9)  BP today: 134/80 Prior BP: 122/82 (05/26/2010)  10 Yr Risk Heart Disease: N/A Prior 10 Yr Risk Heart Disease: 17 % (04/18/2010)  Labs Reviewed: K+: 4.2 (04/18/2010) Creat: : 0.67 (04/18/2010)   Chol: 140 (06/23/2010)   HDL: 46.70 (06/23/2010)   LDL: 72 (06/23/2010)   TG: 107.0 (06/23/2010)  Her updated medication list for this problem includes:    Eq Saline Nasal Spray 0.65 % Soln (Saline) .Marland KitchenMarland KitchenMarland KitchenMarland Kitchen 4-5 times per day  Discussed use of allergy medications and environmental measures.   Complete Medication List: 1)  Synthroid 50 Mcg Tabs (Levothyroxine sodium) .Marland Kitchen.. 1 once daily 2)  Advil 200 Mg Tabs (Ibuprofen) 3)  Buffered Aspirin 325 Mg Tabs (Aspirin buf(cacarb-mgcarb-mgo)) .Marland Kitchen.. 1 by mouth daily 4)  Eq Saline Nasal Spray 0.65 % Soln (Saline) .... 4-5 times per day 5)  Hydrocodone-acetaminophen 7.5-500 Mg Tabs (Hydrocodone-acetaminophen) .... Take 1 tab every 4-6 hours as necessary for pain 6)  Plavix 75 Mg Tabs (Clopidogrel bisulfate) .Marland Kitchen.. 1 by mouth daily 7)  Simvastatin 40 Mg Tabs (Simvastatin) .Marland Kitchen.. 1 by mouth daily  Other Orders: Venipuncture (16109) T-T3, Free [phone]) T-T4, Thyroxine; Total [phone]) TLB-TSH (Thyroid Stimulating Hormone) (84443-TSH)  Hypertension Assessment/Plan:      The patient's hypertensive risk group is category C: Target organ damage and/or diabetes.  Today's blood pressure is 134/80.  Her blood pressure goal is < 140/90.  Lipid Assessment/Plan:      Based on NCEP/ATP III, the patient's risk factor category is "history of coronary disease, peripheral vascular disease, cerebrovascular disease, or aortic aneurysm".  The patient's lipid goals are as follows: Total cholesterol goal is 200; LDL cholesterol goal is 100; HDL cholesterol goal is 40; Triglyceride goal is  150.  Her LDL cholesterol goal has been met.    Patient Instructions: 1)  Please schedule a follow-up appointment in 6 months. 2)  welness exam 3)  if the prolia is cost prohibitive then we recommend reclast   Orders Added: 1)  Venipuncture [13086] 2)  T-T3, Free [57846-96295] 3)  T-T4, Thyroxine; Total [84436-23265] 4)  T-Parathyroid Hormone, Intact w/ Calcium [28413-24401] 5)  T-Vitamin D (25-Hydroxy) [02725-36644] 6)  Est. Patient Level IV [03474] 7)  Specimen Handling [99000] 8)  TLB-TSH (Thyroid Stimulating Hormone) [25956-LOV]

## 2010-09-04 NOTE — Progress Notes (Signed)
Summary: TEST RESULTS  Phone Note Call from Patient Call back at Home Phone [phone]   Caller: Patient Reason for Call: Talk to Nurse, Lab or Test Results Summary of Call: PT WANTS TO KNOW BLOOD TEST RESULTS Initial call taken by: Roe Coombs,  May 13, 2010 3:40 PM  Follow-up for Phone Call        caled pt and left message on voicemail - we have the results of her l/l back but Dr Antoine Poche needs to review them.  He is not in the office today to do so.  Instructed pt I will call back once they have been reviewed. Follow-up by: Charolotte Capuchin, RN,  May 13, 2010 4:46 PM

## 2010-09-04 NOTE — Miscellaneous (Signed)
Summary: Appointment Canceled  Appointment status changed to canceled by [employer] on 04/02/2010 3:52 PM.  Cancellation Comments --------------------- crs/dx:chest pain/wt:125/ins:aetna mcr/jml  Appointment Information ----------------------- Appt Type:  CARDIOLOGY NUCLEAR TESTING      Date:  Thursday, April 03, 2010      Time:  11:45 AM for 15 min   Urgency:  Routine   Made By:  Pearson Grippe  To Visit:  [id number]    Reason:  crs/dx:chest pain/wt:125/ins:aetna mcr/jml  Appt Comments ------------- -- 04/02/10 15:52: ([facility]) CANCELED -- crs/dx:chest pain/wt:125/ins:aetna mcr/jml -- 03/21/10 9:34: ([facility]) BOOKED -- Routine CARDIOLOGY NUCLEAR TESTING at 04/03/2010 11:45 AM for 15 min crs/dx:chest pain/wt:125/ins:aetna mcr/jml

## 2010-09-04 NOTE — Consult Note (Signed)
Summary: [facility]  [facility]   Imported By: Maryln Gottron 09/18/2009 11:30:24  _____________________________________________________________________  External Attachment:    Type:   Image     Comment:   External Document

## 2010-09-04 NOTE — Progress Notes (Signed)
Summary: wants additional labs ordered  Phone Note Call from Patient Call back at Home Phone [phone]   Caller: Patient----triage vm Summary of Call: she had labs done at [facility] this morning.  She wants to add to that lab her thyroid and Serium calcium tests. Initial call taken by: Warnell Forester,  June 23, 2010 9:12 AM  Follow-up for Phone Call        added tsh and calcium given to nana to be added on- pt informed doesnt have to come today for labs- will call her if we need to get any more  Follow-up by: Willy Eddy, LPN,  June 23, 2010 9:29 AM

## 2010-09-04 NOTE — Assessment & Plan Note (Signed)
Summary: Z6X   Primary Provider:  Stacie Glaze MD   History of Present Illness: Kaitlyn Parks is 76 years old and came in today for followup on arm pain thought possibly related to a recent catheterization procedure. She underwent catheterization for exertional angina by Dr. Antoine Poche and was found to have a tight ostial LAD lesion which was stented with a drug-eluting stent. She later developed pain in her wrist and pain in her arm. She had a duplex of her radial artery which was okay and she had an x-ray of her neck which showed significant cervical spine disease. We got her symptoms may be related to cervical nerve root compression.  She has had no symptoms with her heart and she is scheduled into rehabilitation shortly.  Current Medications (verified): 1)  Synthroid 50 Mcg  Tabs (Levothyroxine Sodium) .Marland Kitchen.. 1 Once Daily 2)  Advil 200 Mg  Tabs (Ibuprofen) 3)  Buffered Aspirin 325 Mg  Tabs (Aspirin Buf(Cacarb-Mgcarb-Mgo)) .Marland Kitchen.. 1 By Mouth Daily 4)  Eq Saline Nasal Spray 0.65 % Soln (Saline) .... 4-5 Times Per Day 5)  Hydrocodone-Acetaminophen 7.5-500 Mg Tabs (Hydrocodone-Acetaminophen) .... Take 1 Tab Every 4-6 Hours As Necessary For Pain 6)  Plavix 75 Mg Tabs (Clopidogrel Bisulfate) .Marland Kitchen.. 1 By Mouth Daily 7)  Simvastatin 40 Mg Tabs (Simvastatin) .Marland Kitchen.. 1 By Mouth Daily  Allergies (verified): No Known Drug Allergies  Past History:  Past Medical History: Reviewed history from 04/18/2010 and no changes required. Allergic rhinitis Hypertension (She reports it is borderline and it is not currently treated) Hypothyroidism   Review of Systems       ROS is negative except as outlined in HPI.   Vital Signs:  Patient profile:   76 year old female Height:      65 inches Weight:      126 pounds BMI:     21.04 Pulse rate:   68 / minute Resp:     16 per minute BP sitting:   160 / 85  (left arm)  Vitals Entered By: Marrion Coy, CNA (May 09, 2010 9:36 AM)  Physical Exam  Additional  Exam:  Gen. Well-nourished, in no distress   Neck: No JVD, thyroid not enlarged, no carotid bruits Lungs: No tachypnea, clear without rales, rhonchi or wheezes Cardiovascular: Rhythm regular, PMI not displaced,  heart sounds  normal, no murmurs or gallops, no peripheral edema, pulses normal in all 4 extremities. Abdomen: BS normal, abdomen soft and non-tender without masses or organomegaly, no hepatosplenomegaly. MS: No deformities, no cyanosis or clubbing   Neuro:  No focal sns   Skin:  no lesions    Impression & Recommendations:  Problem # 1:  ARM PAIN (ICD-729.5) The pain at the radial side has resolved. Her radial pulses good. I think her arm pain is related to cervical nerve root irritation from cervical disc disease. She is still having some symptoms at night from this although they are much better. Dr. Lovell Sheehan has arranged for her to have an MRI.  Problem # 2:  HYPERTENSION (ICD-401.9) Or blood pressure is elevated today. She will take her blood pressure readings at home and if they are over 130/80 we asked her to consult with Dr. Lovell Sheehan about therapy. Her updated medication list for this problem includes:    Buffered Aspirin 325 Mg Tabs (Aspirin buf(cacarb-mgcarb-mgo)) .Marland Kitchen... 1 by mouth daily  Problem # 3:  CAD, NATIVE VESSEL (ICD-414.01) She had a DESt to the ostium of the LAD on September 11. She's had  no recurrent chest pain and is Elijah Birk appears stable. I emphasized the importance of interrupted Plavix. Her updated medication list for this problem includes:    Buffered Aspirin 325 Mg Tabs (Aspirin buf(cacarb-mgcarb-mgo)) .Marland Kitchen... 1 by mouth daily    Plavix 75 Mg Tabs (Clopidogrel bisulfate) .Marland Kitchen... 1 by mouth daily  Patient Instructions: 1)  Your physician recommends that you return for FASTING lab work prior to your next appointment with Dr. Antoine Poche on 10/24: lipid/liver (272.2) 2)  Please monitor you blood pressure readings at home. A goal for your blood pressure is 130/80 or  below. Please notify Dr. Antoine Poche or Dr. Lovell Sheehan if your readings are consistently higher than this.  3)  Your physician recommends that you continue on your current medications as directed. Please refer to the Current Medication list given to you today.

## 2010-09-04 NOTE — Progress Notes (Signed)
Summary: refill meds.......severe back pain.  Phone Note Call from Patient   Caller: Patient Call For: Stacie Glaze MD Reason for Call: Acute Illness Summary of Call: Pt needs a refill on Medrol Dose Pack and Methocarbamol. [place]  [phone] Initial call taken by: Lynann Beaver CMA,  August 20, 2009 1:24 PM  Follow-up for Phone Call        willl refill robaxin - she is only taking at bedtime ,so I suggested may to take 1/2 two times a day and 1 at bedtime- has had 3 rounds of medrol dose pack and if surgery is indicated ,may not need to be on so much medrol Follow-up by: Willy Eddy, LPN,  August 20, 2009 2:46 PM    Prescriptions: METHOCARBAMOL 500 MG TABS (METHOCARBAMOL) one by mouth q HS  #30 x 1   Entered by:   Willy Eddy, LPN   Authorized by:   Stacie Glaze MD   Signed by:   Willy Eddy, LPN on 45/40/9811   Method used:   Electronically to        [facility]* (retail)       [address]       [place], [place]  [id number]       Ph: [phone]       Fax: [phone]   RxID:   [id number]

## 2010-09-04 NOTE — Medication Information (Signed)
Summary: Physician's Orders   Physician's Orders   Imported By: Roderic Ovens 05/14/2010 11:44:00  _____________________________________________________________________  External Attachment:    Type:   Image     Comment:   External Document

## 2010-09-04 NOTE — Letter (Signed)
Summary: [facility]  [facility]   Imported By: Maryln Gottron 01/16/2010 09:04:47  _____________________________________________________________________  External Attachment:    Type:   Image     Comment:   External Document

## 2010-09-04 NOTE — Assessment & Plan Note (Signed)
Summary: np6/cp/f/u from stress test & echo/jml   Visit Type:  Initial Consult Primary Provider:  Stacie Glaze MD  CC:  Abnormal ETT.  History of Present Illness: The patient was hospitalized recently for evaluation of atypical chest pain. She ruled out for myocardial infarction and came today for an exercise treadmill test. We had not seen her in consultation. The stress test is revealed below. She reached her target heart rate very quickly in Bruce protocol. She did develop horizontal ST depression in the inferolateral leads as described. This was in early stage II. We terminated exercise after 4 minutes and 15 seconds. She did have some throat discomfort this resolved immediately after stopping exercise. Her EKG went back to baseline. Her blood pressure response was reasonable with a peak of 175/81. She reached 142 beats per minute which was 95% of predicted. In retrospect the patient has had some throat discomfort with activity such as carrying a heavy pale of water in her garden. She might notice it climbing an incline. She's never had any resting symptoms. She doesn't have any shortness of breath, associated nausea vomiting or diaphoresis. She doesn't have any substernal chest discomfort or radiation down her arms. This was not like the pain that prompted the hospitalization which was an abdominal discomfort that started after she woke up and had something to drink. She's not had any of this discomfort since then.  Allergies: No Known Drug Allergies  Past History:  Past Medical History: Allergic rhinitis Hypertension (She reports it is borderline and it is not currently treated) Hypothyroidism   Past Surgical History: Hysterectomy.  Pelvic floor repair for cystocele and rectocele.      Family History: Mother died at the age of 25 from arrhythmia.   She had a sister with stenting in her 84s.  Social History: Married with 2 children Never Smoked  Review of Systems   Positive for rhinitis. Otherwise as stated in the history of present illness negative for all other systems.  Physical Exam  General:  Well developed, well nourished, in no acute distress. Head:  normocephalic and atraumatic Eyes:  PERRLA/EOM intact; conjunctiva and lids normal. Mouth:  Teeth, gums and palate normal. Oral mucosa normal. Neck:  Neck supple, no JVD. No masses, thyromegaly or abnormal cervical nodes. Chest Wall:  no deformities or breast masses noted Lungs:  Clear bilaterally to auscultation and percussion. Abdomen:  Bowel sounds positive; abdomen soft and non-tender without masses, organomegaly, or hernias noted. No hepatosplenomegaly. Msk:  Back normal, normal gait. Muscle strength and tone normal. Extremities:  No clubbing or cyanosis. Neurologic:  Alert and oriented x 3. Skin:  Intact without lesions or rashes. Cervical Nodes:  no significant adenopathy Inguinal Nodes:  no significant adenopathy Psych:  Normal affect.   Detailed Cardiovascular Exam  Neck    Carotids: Carotids full and equal bilaterally without bruits.      Neck Veins: Normal, no JVD.    Heart    Inspection: no deformities or lifts noted.      Palpation: normal PMI with no thrills palpable.      Auscultation: regular rate and rhythm, S1, S2 without murmurs, rubs, gallops, or clicks.    Vascular    Abdominal Aorta: no palpable masses, pulsations, or audible bruits.      Femoral Pulses: normal femoral pulses bilaterally.      Pedal Pulses: normal pedal pulses bilaterally.      Radial Pulses: normal radial pulses bilaterally.      Peripheral Circulation:  no clubbing, cyanosis, or edema noted with normal capillary refill.     EKG  Procedure date:  04/18/2010  Findings:      Sinus rhythm, rate 71, axis within normal limits, intervals within normal limits, ventricular hypertrophy by voltage criteria, poor anterior R-wave progression  Impression & Recommendations:  Problem # 1:  ABNORMAL  STRESS ELECTROCARDIOGRAM (ICD-794.31) The patient had an abnormal stress test. Sounds like she might be having exertional angina. I think the posttest probability of obstructive coronary disease is high. Therefore, she should have cardiac catheterization. She is currently pain free and is not having any resting symptoms. She had resolution of her EKG changes completely. She will come for cardiac catheterization Monday morning. Should take her aspirin. She knows if she has any resting chest discomfort she should present to the emergency room. Orders: T-Basic Metabolic Panel [id number]) T-CBC No Diff ([id number]) T-PTT ([id number]) T-Protime, Auto ([id number])  Problem # 2:  HYPERTENSION (ICD-401.9) Her blood pressure was slightly elevated today but she says it's well-controlled at home. I suspect that side and she knows and has suggested a blood pressure diary. Certainly if we find coronary disease we will treat her with beta blockers and very aggressive with risk reduction.  Problem # 3:  PREVENTIVE HEALTH CARE (ICD-V70.0) I did review her cholesterol with an LDL of 120 and HDL of 47. She is currently not on therapy for this. Certainly the goals will be adjusted if she is found to have coronary disease. I will review this at the catheterization.  Patient Instructions: 1)  Your physician recommends that you schedule a follow-up appointment after cath 2)  Your physician recommends that you continue on your current medications as directed. Please refer to the Current Medication list given to you today.

## 2010-09-04 NOTE — Progress Notes (Signed)
Summary: pt had cath and now is having pain in right arm  Phone Note Call from Patient Call back at Home Phone [phone]   Caller: pt Reason for Call: Talk to Nurse Summary of Call: pt had a cath recently and is not having shooting pain down right arm to the point she can not sleep. Initial call taken by: Faythe Ghee,  April 29, 2010 8:09 AM  Follow-up for Phone Call        I talked with pt by telephone--she states since she left the hospital on Saturday 04/26/10 she has had pain in her right arm--at time of cath 04/21/10 pt states Dr Juanda Chance attempted right radial approach  but he was unable to access the right radial artery--pt states she is having tingling in her right arm from her fingertips to her shoulder with some bruising and swelling--she does state she has some numbness between her thumb and first finger on her right hand-this has been almost constant since Saturday but she does get some relief from Aleve--I will  review with Dr Tia Masker, RN, BSN  April 29, 2010 9:17 AM   reviewed with Dr Michael Boston to come to office for Allen's   test today  at 12:30 Luana Shu   Additional Follow-up for Phone Call Additional follow up Details #1::        pt to office for Allen's  test on right arm and hand--Allen's  test performed by Tawnya Crook and it was negative(pt had flushing  in her fingers after the ulnar artery was released)--pt has good radial and brachial pulse in right arm and hand--Dr Hochrien looked at pt's  right hand and arm and talked with pt--pt will continue to use Aleve as needed for pain right now--B/P 114/68 taken at pt's request--appt made for pt with Dr Antoine Poche 05/14/10--pt knows to call if symptoms do not improve Luana Shu

## 2010-09-04 NOTE — Letter (Signed)
Summary: [employer] at [employer]. [address]   [place], [place] [address]   Phone: [phone]  Fax: [phone]         July 09, 2010 MRN: [id number]   Kaitlyn Parks [address] [place], [place]  [address]   Dear Ms. Andrey Campanile,  We have reviewed your cholesterol results.  They are as follows:     Total Cholesterol:    140 (Desirable: less than 200)       HDL  Cholesterol:     46.70  (Desirable: greater than 40 for men and 50 for women)       LDL Cholesterol:       72  (Desirable: less than 100 for low risk and less than 70 for moderate to high risk)       Triglycerides:       107.0  (Desirable: less than 150)  Our recommendations include:  No changes, continue current treatment.  Call our office at the number listed above if you have any questions.  Lowering your LDL cholesterol is important, but it is only one of a large number of "risk factors" that may indicate that you are at risk for heart disease, stroke or other complications of hardening of the arteries.  Other risk factors include:   A.  Cigarette Smoking* B.  High Blood Pressure* C.  Obesity* D.   Low HDL Cholesterol (see yours above)* E.   Diabetes Mellitus (higher risk if your is uncontrolled) F.  Family history of premature heart disease G.  Previous history of stroke or cardiovascular disease    *These are risk factors YOU HAVE CONTROL OVER.  For more information, visit .        There is now evidence that lowering the TOTAL CHOLESTEROL AND LDL CHOLESTEROL can reduce the risk of heart disease.  The American Heart Association recommends the following guidelines for the treatment of elevated cholesterol:  1.  If there is now current heart disease and less than two risk factors, TOTAL CHOLESTEROL should be less than 200 and LDL CHOLESTEROL should be less than 100. 2.  If there is current heart disease or two or more risk factors, TOTAL CHOLESTEROL should be less than 200  and LDL CHOLESTEROL should be less than 70.  A diet low in cholesterol, saturated fat, and calories is the cornerstone of treatment for elevated cholesterol.  Cessation of smoking and exercise are also important in the management of elevated cholesterol and preventing vascular disease.  Studies have shown that 30 to 60 minutes of physical activity most days can help lower blood pressure, lower cholesterol, and keep your weight at a healthy level.  Drug therapy is used when cholesterol levels do not respond to therapeutic lifestyle changes (smoking cessation, diet, and exercise) and remains unacceptably high.  If medication is started, it is important to have you levels checked periodically to evaluate the need for further treatment options.      Thank you,     Sander Nephew, RN for Dr Rollene Rotunda [facility] Team

## 2010-09-04 NOTE — Assessment & Plan Note (Signed)
Summary: [facility]   Visit Type:  Follow-up Primary Provider:  Stacie Glaze MD  CC:  CAD.  History of Present Illness: The patient presents for followup after hospitalization and percutaneous revascularization of an LAD lesion. She had problems with right arm pain following this but ultrasonography demonstrated no abnormalities. This has resolved. She has done one session of cardiac rehabilitation. With this she has had no chest pressure, neck or arm discomfort. She has had no palpitations, presyncope or syncope. She has had no shortness of breath, PND or orthopnea. Of note she did bruise her left arm and has discomfort related to this.  Current Medications (verified): 1)  Synthroid 50 Mcg  Tabs (Levothyroxine Sodium) .Marland Kitchen.. 1 Once Daily 2)  Advil 200 Mg  Tabs (Ibuprofen) 3)  Buffered Aspirin 325 Mg  Tabs (Aspirin Buf(Cacarb-Mgcarb-Mgo)) .Marland Kitchen.. 1 By Mouth Daily 4)  Eq Saline Nasal Spray 0.65 % Soln (Saline) .... 4-5 Times Per Day 5)  Hydrocodone-Acetaminophen 7.5-500 Mg Tabs (Hydrocodone-Acetaminophen) .... Take 1 Tab Every 4-6 Hours As Necessary For Pain 6)  Plavix 75 Mg Tabs (Clopidogrel Bisulfate) .Marland Kitchen.. 1 By Mouth Daily 7)  Simvastatin 40 Mg Tabs (Simvastatin) .Marland Kitchen.. 1 By Mouth Daily  Allergies (verified): No Known Drug Allergies  Past History:  Past Medical History: Allergic rhinitis Hypertension (She reports it is borderline and it is not currently treated) Hypothyroidism  CAD (April 25, 2010, and underwent successful PCI and stenting of the LAD with placement of a 2.5 x 18 mm Promus drug-eluting stent.)  Vital Signs:  Patient profile:   76 year old female Height:      65 inches Weight:      126 pounds BMI:     21.04 Pulse rate:   69 / minute Resp:     16 per minute BP sitting:   122 / 82  (right arm)  Vitals Entered By: Marrion Coy, CNA (May 26, 2010 3:21 PM)  Physical Exam  General:  Well developed, well nourished, in no acute distress. Head:   normocephalic and atraumatic Eyes:  PERRLA/EOM intact; conjunctiva and lids normal. Mouth:  Teeth, gums and palate normal. Oral mucosa normal. Neck:  Neck supple, no JVD. No masses, thyromegaly or abnormal cervical nodes. Chest Wall:  no deformities or breast masses noted Lungs:  Clear bilaterally to auscultation and percussion. Abdomen:  Bowel sounds positive; abdomen soft and non-tender without masses, organomegaly, or hernias noted. No hepatosplenomegaly. Msk:  Back normal, normal gait. Muscle strength and tone normal. Extremities:  left arm bruising or slight hematoma, otherwise unremarkable Neurologic:  Alert and oriented x 3. Skin:  Intact without lesions or rashes. Cervical Nodes:  no significant adenopathy Axillary Nodes:  no significant adenopathy Inguinal Nodes:  no significant adenopathy Psych:  Normal affect.   Detailed Cardiovascular Exam  Neck    Carotids: Carotids full and equal bilaterally without bruits.      Neck Veins: Normal, no JVD.    Heart    Inspection: no deformities or lifts noted.      Palpation: normal PMI with no thrills palpable.      Auscultation: regular rate and rhythm, S1, S2 without murmurs, rubs, gallops, or clicks.    Vascular    Abdominal Aorta: no palpable masses, pulsations, or audible bruits.      Femoral Pulses: normal femoral pulses bilaterally.      Pedal Pulses: normal pedal pulses bilaterally.      Radial Pulses: normal radial pulses bilaterally.      Peripheral Circulation:  no clubbing, cyanosis, or edema noted with normal capillary refill.     Impression & Recommendations:  Problem # 1:  CAD, NATIVE VESSEL (ICD-414.01) She is doing well with this. Further cardiovascular testing is suggested. She will continue with risk reduction. Orders: EKG w/ Interpretation (93000)  Problem # 2:  MIXED HYPERLIPIDEMIA (ICD-272.2) I will check a lipid profile in one weeks with a goal LDL less than 100 HDL greater then 50.  Problem # 3:   HYPERTENSION (ICD-401.9) Her blood pressure is controlled. She will continue the meds as listed.  Patient Instructions: 1)  Your physician recommends that you schedule a follow-up appointment in: 3 months with Dr Antoine Poche 2)  Your physician recommends that you return for a FASTING lipid and liver profile: in 4 weeks 272.4  v58.69 3)  Your physician recommends that you continue on your current medications as directed. Please refer to the Current Medication list given to you today.

## 2010-09-04 NOTE — Progress Notes (Signed)
Summary: Pt req to get Prolia Inj.   Phone Note Call from Patient Call back at Home Phone [phone]   Caller: Patient Reason for Call: Acute Illness Summary of Call: Pt called and is req to get Prolia inj and wanted to know if this med was available and when she could sch?  Initial call taken by: Lucy Antigua,  August 26, 2010 2:40 PM  Follow-up for Phone Call        prolia ordered and ov given for friday at 9:30 Follow-up by: Willy Eddy, LPN,  August 28, 2010 8:28 AM

## 2010-09-04 NOTE — Miscellaneous (Signed)
Summary: Orders Update  Clinical Lists Changes  Orders: Added new Test order of TLB-TSH (Thyroid Stimulating Hormone) ([id number]) - Signed 

## 2010-09-04 NOTE — Progress Notes (Signed)
Summary: please advise pt  Phone Note Call from Patient Call back at Home Phone [phone]   Caller: Patient--triage vm Reason for Call: Talk to Doctor Summary of Call: MRI was ordered on her neck. her condition has approved. She would like to just do some movements and not do the MRI at this time. requesting to have thyroid labs. please return call. Initial call taken by: Warnell Forester,  June 03, 2010 3:15 PM  Follow-up for Phone Call        tell pt we will cancel mri and she can have tsh (244.9) but she needs office visit with dr Lovell Sheehan in about 1 -2 weeks after tsh Follow-up by: Willy Eddy, LPN,  June 03, 2010 3:24 PM  Additional Follow-up for Phone Call Additional follow up Details #1::        lmoam to return call. Additional Follow-up by: Warnell Forester,  June 03, 2010 3:35 PM    Additional Follow-up for Phone Call Additional follow up Details #2::    Pt called in and scheduled appt for labwork (tsh) on 11/21  and  f/u with Dr Lovell Sheehan on  11/29.  Follow-up by: Debbra Riding,  June 04, 2010 11:09 AM

## 2010-09-04 NOTE — Letter (Signed)
Summary: Cardiac Catheterization Instructions- [facility]  [facility], Main Office  [address]   [place], [place] [address]   Phone: [phone]  Fax: [phone]     04/18/2010 MRN: [id number]  Kaitlyn Parks [address] [place], [place]  [address]  Dear Ms. STEHR,   You are scheduled for a Cardiac Catheterization on Monday April 21, 2010 with Dr. Rollene Rotunda Please arrive to the 1st floor of the Heart and Vascular Center at [facility] at 6:30 am  on the day of your procedure. Please do not arrive before 6:30 a.m. Call the Heart and Vascular Center at [phone] if you are unable to make your appointmnet. The Code to get into the parking garage under the building is 0020. Take the elevators to the 1st floor. You must have someone to drive you home. Someone must be with you for the first 24 hours after you arrive home. Please wear clothes that are easy to get on and off and wear slip-on shoes. Do not eat or drink after midnight except water with your medications that morning. Bring all your medications and current insurance cards with you.   ___ You may take ALL of your medications with water that morning. ________________________________________________________________________________________________________________________________  ___ DO NOT take ANY medications before your procedure.   The usual length of stay after your procedure is 2 to 3 hours. This can vary.  If you have any questions, please call the office at the number listed above.   Charolotte Capuchin, RN for Dr Rollene Rotunda

## 2010-09-04 NOTE — Miscellaneous (Signed)
Summary: [facility]   [facility]   Imported By: Roderic Ovens 06/16/2010 14:57:33  _____________________________________________________________________  External Attachment:    Type:   Image     Comment:   External Document

## 2010-09-04 NOTE — Miscellaneous (Signed)
Summary: Orders Update  Clinical Lists Changes  Problems: Added new problem of ARM PAIN (ICD-729.5) Orders: Added new Test order of Arterial Duplex Upper Extremity (Arterial Duplex Up ) - Signed

## 2010-09-04 NOTE — Progress Notes (Signed)
Summary: pt having very bad pain in right arm.  Phone Note Call from Patient Call back at Home Phone [phone]   Caller: pt Reason for Call: Talk to Nurse Summary of Call: pt is having very bad arm pain that started after cath was done. She has already called about this but it is still causing a problume. She can not sleep or function. She states that it is much worse then the pain that she was having in her chest that lead to the cath. Pt has been taking aleve to go to bed but is back up again at 2 am.  Initial call taken by: Faythe Ghee,  May 01, 2010 10:58 AM  Follow-up for Phone Call        Spoke with pt. Patient states she is having a  horrific pain on right arm. Pain score now is a "6" from the tip of her fingers  with tingling sensation  to the shoulder. Pt. states during the night the pain is about a "11".  Pt. states she is unable to open her right hand. She has to force it open. Pt. had called  with  the same problem on 04/29/10 spoke with Katina Dung RN. a Allen's test was done by [facility]. Pt. states the pain is worse now. Pt. would like to have a test to find out if she has nerve damages. Pt. is taken Alive for pain. Ollen Gross, RN, BSN  May 01, 2010 11:28 AM   Additional Follow-up for Phone Call Additional follow up Details #1::        Dr Antoine Poche has ordered an arterial ultrasound to observe the blood flow in the radial artery where stick was done for the cath. pt scheduled for a ultrasound tomorrow at 12N.  Pt states that she is still in quite a bit of pain and she is not sleeping because of the discomfort.  She doesn't want an rx for her pain at this time but will let us know if she has another bad night. Additional Follow-up by: Charolotte Capuchin, RN,  May 01, 2010 5:18 PM

## 2010-09-04 NOTE — Letter (Signed)
Summary: [facility]  [facility]   Imported By: Maryln Gottron 04/22/2010 10:40:42  _____________________________________________________________________  External Attachment:    Type:   Image     Comment:   External Document

## 2010-09-04 NOTE — Miscellaneous (Signed)
Clinical Lists Changes  Observations: Added new observation of LEA DUPLEX: NO evidence of right upper extremity arterial occlusive disease, pseudoaneurysm, or AV fistula. (05/02/2010 10:34) Added new observation of CARDCATHFIND: RESULTS:  Initially stenosis in the proximal LAD was 90% in its most severe point, but there was disease all the way from the ostium to the bifurcation with a first diagonal branch.  Following stenting, this improved to 0%.   CONCLUSION:  Successful IVUS guided PCI of the lesion in the proximal LAD using a Promus drug-eluting stent with improvement in center narrowing from 90% to 0%.   DISPOSITION:  The patient returned to post angio room for further observation. (04/26/2010 10:35) Added new observation of CARDCATHFIND: CONCLUSION:  Coronary artery disease with 80% narrowing in the proximal left anterior descending, no significant obstruction in the circumflex artery, 30% narrowing in the mid right coronary artery, and normal left ventricular function.   RECOMMENDATIONS:  I believe that the LAD lesion does appear to be significant and I think this is likely flow-limiting.  Combined with exercise-induced throat pain and abnormal stress test, she does have ischemia related to this lesion and we have recommended percutaneous coronary intervention. We will plan to do this later this week.  The intervention may office some technical challenges in finding a good view to lay out the ostium of the LAD.  It appears like the stent will have to go all the way back to the ostium.   (04/21/2010 10:35) Added new observation of ETTFINDING:    Quality of ETT:   diagnostic    ETT Interpretation:   abnormal-evidence of ST depression consistent with ischemia    Comments:     The patient had a positive exercise test with inferior and lateral ST depression and throat discomfort in early Stage II    Recommendations:   Cardiac cath.   (04/18/2010 10:33)      Exercise Stress  Test  Procedure date:  04/18/2010  Findings:         Quality of ETT:   diagnostic    ETT Interpretation:   abnormal-evidence of ST depression consistent with ischemia    Comments:     The patient had a positive exercise test with inferior and lateral ST depression and throat discomfort in early Stage II    Recommendations:   Cardiac cath.    Arterial Doppler  Procedure date:  05/02/2010  Findings:      NO evidence of right upper extremity arterial occlusive disease, pseudoaneurysm, or AV fistula.  Cardiac Cath  Procedure date:  04/26/2010  Findings:      RESULTS:  Initially stenosis in the proximal LAD was 90% in its most severe point, but there was disease all the way from the ostium to the bifurcation with a first diagonal branch.  Following stenting, this improved to 0%.   CONCLUSION:  Successful IVUS guided PCI of the lesion in the proximal LAD using a Promus drug-eluting stent with improvement in center narrowing from 90% to 0%.   DISPOSITION:  The patient returned to post angio room for further observation.  Cardiac Cath  Procedure date:  04/21/2010  Findings:      CONCLUSION:  Coronary artery disease with 80% narrowing in the proximal left anterior descending, no significant obstruction in the circumflex artery, 30% narrowing in the mid right coronary artery, and normal left ventricular function.   RECOMMENDATIONS:  I believe that the LAD lesion does appear to be significant and I think this is likely flow-limiting.  Combined with exercise-induced throat pain and abnormal stress test, she does have ischemia related to this lesion and we have recommended percutaneous coronary intervention. We will plan to do this later this week.  The intervention may office some technical challenges in finding a good view to lay out the ostium of the LAD.  It appears like the stent will have to go all the way back to the ostium.

## 2010-09-05 ENCOUNTER — Encounter (HOSPITAL_COMMUNITY): Payer: Medicare Other

## 2010-09-08 ENCOUNTER — Encounter (HOSPITAL_COMMUNITY): Payer: Medicare Other

## 2010-09-10 ENCOUNTER — Encounter (HOSPITAL_COMMUNITY): Payer: Medicare Other

## 2010-09-12 ENCOUNTER — Encounter (HOSPITAL_COMMUNITY): Payer: Medicare Other

## 2010-09-15 ENCOUNTER — Encounter (HOSPITAL_COMMUNITY): Payer: Medicare Other

## 2010-09-17 ENCOUNTER — Encounter (HOSPITAL_COMMUNITY): Payer: Medicare Other

## 2010-09-18 NOTE — Assessment & Plan Note (Signed)
Summary: prolia injection/bmw   Vital Signs:  Patient profile:   76 year old female Height:      65 inches Weight:      125 pounds BMI:     20.88 Temp:     98.2 degrees F oral Pulse rate:   68 / minute Resp:     14 per minute BP sitting:   124 / 80  (left arm)  Vitals Entered By: Willy Eddy, LPN (August 29, 2010 10:24 AM) CC: DISCUSS PROLIA INJECTIONS, Hypertension Management Is Patient Diabetic? No   Primary Care Provider:  Stacie Glaze MD  CC:  DISCUSS PROLIA INJECTIONS and Hypertension Management.  History of Present Illness: has been through CV rehabwith good blood pressure and pulses... feels well. Need to keep up with three times a week exercize. Here to discuss prolia.  Hypertension History:      She denies headache, chest pain, palpitations, dyspnea with exertion, orthopnea, PND, peripheral edema, visual symptoms, neurologic problems, syncope, and side effects from treatment.        Positive major cardiovascular risk factors include female age 106 years old or older, hyperlipidemia, and hypertension.  Negative major cardiovascular risk factors include non-tobacco-user status.        Positive history for target organ damage include ASHD (either angina/prior MI/prior CABG).  Further assessment for target organ damage reveals no history of cardiac end-organ damage (CHF/LVH), stroke/TIA, peripheral vascular disease, renal insufficiency, or hypertensive retinopathy.     Preventive Screening-Counseling & Management  Alcohol-Tobacco     Smoking Status: never     Tobacco Counseling: not indicated; no tobacco use  Problems Prior to Update: 1)  Osteoporosis  (ICD-733.00) 2)  Mixed Hyperlipidemia  (ICD-272.2) 3)  Cad, Native Vessel  (ICD-414.01) 4)  Arm Pain  (ICD-729.5) 5)  Abnormal Stress Electrocardiogram  (ICD-794.31) 6)  Back Pain, Lumbosacral, Chronic  (ICD-724.5) 7)  Chronic Frontal Sinusitis  (ICD-473.1) 8)  Loc Osteoarthros Not Spec Whether Prim/sec Hand   (ICD-715.34) 9)  Preventive Health Care  (ICD-V70.0) 10)  Headache  (ICD-784.0) 11)  Hypertension  (ICD-401.9) 12)  Allergic Rhinitis  (ICD-477.9) 13)  Hypothyroidism  (ICD-244.9) 14)  Mitral Valve Prolapse  (ICD-424.0) 15)  Dermatitis, Contact, Nec  (ICD-692.89)  Current Problems (verified): 1)  Osteoporosis  (ICD-733.00) 2)  Mixed Hyperlipidemia  (ICD-272.2) 3)  Cad, Native Vessel  (ICD-414.01) 4)  Arm Pain  (ICD-729.5) 5)  Abnormal Stress Electrocardiogram  (ICD-794.31) 6)  Back Pain, Lumbosacral, Chronic  (ICD-724.5) 7)  Chronic Frontal Sinusitis  (ICD-473.1) 8)  Loc Osteoarthros Not Spec Whether Prim/sec Hand  (ICD-715.34) 9)  Preventive Health Care  (ICD-V70.0) 10)  Headache  (ICD-784.0) 11)  Hypertension  (ICD-401.9) 12)  Allergic Rhinitis  (ICD-477.9) 13)  Hypothyroidism  (ICD-244.9) 14)  Mitral Valve Prolapse  (ICD-424.0) 15)  Dermatitis, Contact, Nec  (ICD-692.89)  Medications Prior to Update: 1)  Synthroid 50 Mcg  Tabs (Levothyroxine Sodium) .Marland Kitchen.. 1 Once Daily 2)  Aspirin 81 Mg  Tabs (Aspirin) .Marland Kitchen.. 1 By Mouth Daily 3)  Eq Saline Nasal Spray 0.65 % Soln (Saline) .... 4-5 Times Per Day 4)  Hydrocodone-Acetaminophen 7.5-500 Mg Tabs (Hydrocodone-Acetaminophen) .... Take 1 Tab Every 4-6 Hours As Necessary For Pain 5)  Plavix 75 Mg Tabs (Clopidogrel Bisulfate) .Marland Kitchen.. 1 By Mouth Daily 6)  Simvastatin 40 Mg Tabs (Simvastatin) .Marland Kitchen.. 1 By Mouth Daily  Current Medications (verified): 1)  Synthroid 50 Mcg  Tabs (Levothyroxine Sodium) .Marland Kitchen.. 1 Once Daily 2)  Aspirin 81 Mg  Tabs (  Aspirin) .Marland Kitchen.. 1 By Mouth Daily 3)  Eq Saline Nasal Spray 0.65 % Soln (Saline) .... 4-5 Times Per Day 4)  Hydrocodone-Acetaminophen 7.5-500 Mg Tabs (Hydrocodone-Acetaminophen) .... Take 1 Tab Every 4-6 Hours As Necessary For Pain 5)  Plavix 75 Mg Tabs (Clopidogrel Bisulfate) .Marland Kitchen.. 1 By Mouth Daily 6)  Simvastatin 40 Mg Tabs (Simvastatin) .Marland Kitchen.. 1 By Mouth Daily  Allergies (verified): No Known Drug  Allergies  Past History:  Family History: Last updated: 04/29/10 Mother died at the age of 54 from arrhythmia.   She had a sister with stenting in her 4s.  Social History: Last updated: 04/29/10 Married with 2 children Never Smoked  Risk Factors: Smoking Status: never (08/29/2010)  Past medical, surgical, family and social histories (including risk factors) reviewed, and no changes noted (except as noted below).  Past Medical History: Reviewed history from 05/26/2010 and no changes required. Allergic rhinitis Hypertension (She reports it is borderline and it is not currently treated) Hypothyroidism  CAD (April 25, 2010, and underwent successful PCI and stenting of the LAD with placement of a 2.5 x 18 mm Promus drug-eluting stent.)  Past Surgical History: Reviewed history from 04/29/10 and no changes required. Hysterectomy.  Pelvic floor repair for cystocele and rectocele.      Family History: Reviewed history from 04-29-2010 and no changes required. Mother died at the age of 80 from arrhythmia.   She had a sister with stenting in her 93s.  Social History: Reviewed history from Apr 29, 2010 and no changes required. Married with 2 children Never Smoked  Review of Systems  The patient denies anorexia, fever, weight loss, weight gain, vision loss, decreased hearing, hoarseness, chest pain, syncope, dyspnea on exertion, peripheral edema, prolonged cough, headaches, hemoptysis, abdominal pain, melena, hematochezia, severe indigestion/heartburn, hematuria, incontinence, genital sores, muscle weakness, suspicious skin lesions, transient blindness, difficulty walking, depression, unusual weight change, abnormal bleeding, enlarged lymph nodes, angioedema, and breast masses.    Physical Exam  Head:  Normocephalic and atraumatic without obvious abnormalities. No apparent alopecia or balding. Eyes:  pupils equal and pupils round.   Ears:  R ear normal and L ear normal.    Nose:  no external deformity and no nasal discharge.   Neck:  No deformities, masses, or tenderness noted. Lungs:  normal respiratory effort.   Heart:  normal rate and regular rhythm.     Impression & Recommendations:  Problem # 1:  OSTEOPOROSIS (ICD-733.00)  Discussed medication use, applications of heat or ice, and exercises.   Orders: Prolia 60mg  (J3590) Admin of Therapeutic Inj  intramuscular or subcutaneous (16109)  Problem # 2:  HYPERTENSION (ICD-401.9) Assessment: Improved  BP today: 124/80 Prior BP: 149/69 (08/12/2010)  Prior 10 Yr Risk Heart Disease: N/A (07/01/2010)  Labs Reviewed: K+: 4.2 (04-29-2010) Creat: : 0.67 (Apr 29, 2010)   Chol: 140 (06/23/2010)   HDL: 46.70 (06/23/2010)   LDL: 72 (06/23/2010)   TG: 107.0 (06/23/2010)  Complete Medication List: 1)  Synthroid 50 Mcg Tabs (Levothyroxine sodium) .Marland Kitchen.. 1 once daily 2)  Aspirin 81 Mg Tabs (Aspirin) .Marland Kitchen.. 1 by mouth daily 3)  Eq Saline Nasal Spray 0.65 % Soln (Saline) .... 4-5 times per day 4)  Hydrocodone-acetaminophen 7.5-500 Mg Tabs (Hydrocodone-acetaminophen) .... Take 1 tab every 4-6 hours as necessary for pain 5)  Plavix 75 Mg Tabs (Clopidogrel bisulfate) .Marland Kitchen.. 1 by mouth daily 6)  Simvastatin 40 Mg Tabs (Simvastatin) .Marland Kitchen.. 1 by mouth daily  Hypertension Assessment/Plan:      The patient's hypertensive risk group is category C: Target  organ damage and/or diabetes.  Today's blood pressure is 124/80.  Her blood pressure goal is < 140/90.  Patient Instructions: 1)  Please schedule a follow-up appointment in 6 months. for second prolia and CPX   Medication Administration  Injection # 1:    Medication: Prolia 60mg     Diagnosis: OSTEOPOROSIS (ICD-733.00)    Route: SQ    Site: L deltoid    Exp Date: 09/03/2011    Lot #: [id number]    Mfr: [id number]    Patient tolerated injection without complications    Given by: Willy Eddy, LPN (August 29, 2010 12:16 PM)  Orders Added: 1)  Prolia 60mg  [J3590] 2)   Admin of Therapeutic Inj  intramuscular or subcutaneous [96372] 3)  Est. Patient Level III [11914]

## 2010-09-19 ENCOUNTER — Encounter (HOSPITAL_COMMUNITY): Payer: Medicare Other

## 2010-09-22 ENCOUNTER — Encounter (HOSPITAL_COMMUNITY): Payer: Medicare Other

## 2010-09-24 ENCOUNTER — Encounter (HOSPITAL_COMMUNITY): Payer: Medicare Other

## 2010-09-26 ENCOUNTER — Encounter (HOSPITAL_COMMUNITY): Payer: Medicare Other

## 2010-09-29 ENCOUNTER — Encounter (HOSPITAL_COMMUNITY): Payer: Medicare Other

## 2010-10-01 ENCOUNTER — Encounter (HOSPITAL_COMMUNITY): Payer: Medicare Other

## 2010-10-03 ENCOUNTER — Encounter (HOSPITAL_COMMUNITY): Payer: 59

## 2010-10-06 ENCOUNTER — Encounter (HOSPITAL_COMMUNITY): Payer: 59

## 2010-10-08 ENCOUNTER — Encounter (HOSPITAL_COMMUNITY): Payer: 59

## 2010-10-10 ENCOUNTER — Encounter (HOSPITAL_COMMUNITY): Payer: 59

## 2010-10-13 ENCOUNTER — Encounter (HOSPITAL_COMMUNITY): Payer: 59

## 2010-10-15 ENCOUNTER — Encounter (HOSPITAL_COMMUNITY): Payer: 59

## 2010-10-16 LAB — CBC
HCT: 38.4 % (ref 36.0–46.0)
Hemoglobin: 12.6 g/dL (ref 12.0–15.0)
MCH: 30.3 pg (ref 26.0–34.0)
MCV: 91.9 fL (ref 78.0–100.0)
MCV: 91.9 fL (ref 78.0–100.0)
Platelets: 202 10*3/uL (ref 150–400)
RBC: 4.06 MIL/uL (ref 3.87–5.11)
RBC: 4.18 MIL/uL (ref 3.87–5.11)
RDW: 12.3 % (ref 11.5–15.5)
WBC: 3.8 10*3/uL — ABNORMAL LOW (ref 4.0–10.5)
WBC: 4.3 10*3/uL (ref 4.0–10.5)

## 2010-10-16 LAB — BASIC METABOLIC PANEL
BUN: 19 mg/dL (ref 6–23)
CO2: 26 mEq/L (ref 19–32)
Chloride: 105 mEq/L (ref 96–112)
Chloride: 107 mEq/L (ref 96–112)
GFR calc Af Amer: >60 mL/min (ref >60)
GFR calc Af Amer: >60 mL/min (ref >60)
Potassium: 3.9 mEq/L (ref 3.5–5.1)
Potassium: 4.7 mEq/L (ref 3.5–5.1)
Sodium: 139 mEq/L (ref 135–145)
Sodium: 139 mEq/L (ref 135–145)

## 2010-10-16 LAB — PROTIME-INR: INR: 0.89 (ref 0.00–1.49)

## 2010-10-16 LAB — PLATELET INHIBITION P2Y12: P2Y12 % Inhibition: 61 %

## 2010-10-17 ENCOUNTER — Encounter (HOSPITAL_COMMUNITY): Payer: 59

## 2010-10-17 LAB — LIPID PANEL
HDL: 47 mg/dL (ref >39)
Triglycerides: 97 mg/dL (ref <150)
VLDL: 19 mg/dL (ref 0–40)

## 2010-10-17 LAB — COMPREHENSIVE METABOLIC PANEL
ALT: 13 U/L (ref 0–35)
Albumin: 3.3 g/dL — ABNORMAL LOW (ref 3.5–5.2)
Alkaline Phosphatase: 63 U/L (ref 39–117)
Alkaline Phosphatase: 68 U/L (ref 39–117)
BUN: 17 mg/dL (ref 6–23)
CO2: 29 mEq/L (ref 19–32)
CO2: 30 mEq/L (ref 19–32)
Calcium: 8.9 mg/dL (ref 8.4–10.5)
Chloride: 104 mEq/L (ref 96–112)
Chloride: 106 mEq/L (ref 96–112)
Creatinine, Ser: 0.79 mg/dL (ref 0.4–1.2)
GFR calc Af Amer: >60 mL/min (ref >60)
GFR calc non Af Amer: >60 mL/min (ref >60)
Glucose, Bld: 113 mg/dL — ABNORMAL HIGH (ref 70–99)
Potassium: 3.8 mEq/L (ref 3.5–5.1)
Sodium: 140 mEq/L (ref 135–145)
Sodium: 141 mEq/L (ref 135–145)
Total Bilirubin: 0.5 mg/dL (ref 0.3–1.2)
Total Protein: 6.1 g/dL (ref 6.0–8.3)

## 2010-10-17 LAB — CBC
HCT: 39.5 % (ref 36.0–46.0)
Hemoglobin: 12.9 g/dL (ref 12.0–15.0)
Hemoglobin: 13.2 g/dL (ref 12.0–15.0)
MCV: 90.2 fL (ref 78.0–100.0)
RBC: 4.33 MIL/uL (ref 3.87–5.11)
WBC: 4.3 10*3/uL (ref 4.0–10.5)

## 2010-10-17 LAB — POCT CARDIAC MARKERS

## 2010-10-17 LAB — TROPONIN I: Troponin I: 0.01 ng/mL (ref 0.00–0.06)

## 2010-10-17 LAB — HEMOGLOBIN A1C: Hgb A1c MFr Bld: 5.4 % (ref <5.7)

## 2010-10-17 LAB — CARDIAC PANEL(CRET KIN+CKTOT+MB+TROPI)
CK, MB: 1.7 ng/mL (ref 0.3–4.0)
CK, MB: 1.9 ng/mL (ref 0.3–4.0)
Total CK: 67 U/L (ref 7–177)
Total CK: 71 U/L (ref 7–177)

## 2010-10-17 LAB — DIFFERENTIAL
Basophils Absolute: 0 10*3/uL (ref 0.0–0.1)
Basophils Relative: 1 % (ref 0–1)
Basophils Relative: 1 % (ref 0–1)
Eosinophils Absolute: 0.2 10*3/uL (ref 0.0–0.7)
Lymphocytes Relative: 33 % (ref 12–46)
Lymphs Abs: 1.3 10*3/uL (ref 0.7–4.0)
Monocytes Absolute: 0.5 10*3/uL (ref 0.1–1.0)
Monocytes Relative: 11 % (ref 3–12)
Monocytes Relative: 9 % (ref 3–12)
Neutro Abs: 2 10*3/uL (ref 1.7–7.7)
Neutrophils Relative %: 50 % (ref 43–77)
Neutrophils Relative %: 51 % (ref 43–77)

## 2010-10-17 LAB — T4, FREE: Free T4: 1 ng/dL (ref 0.80–1.80)

## 2010-10-17 LAB — T3, FREE: T3, Free: 2.3 pg/mL (ref 2.3–4.2)

## 2010-10-17 LAB — MAGNESIUM: Magnesium: 2.3 mg/dL (ref 1.5–2.5)

## 2010-10-17 LAB — CK TOTAL AND CKMB (NOT AT ARMC): Relative Index: INVALID (ref 0.0–2.5)

## 2010-10-20 ENCOUNTER — Encounter (HOSPITAL_COMMUNITY): Payer: 59

## 2010-10-22 ENCOUNTER — Other Ambulatory Visit: Payer: Self-pay | Admitting: Cardiology

## 2010-10-22 ENCOUNTER — Encounter (HOSPITAL_COMMUNITY): Payer: 59

## 2010-10-24 ENCOUNTER — Encounter (HOSPITAL_COMMUNITY): Payer: 59

## 2010-10-27 ENCOUNTER — Encounter (HOSPITAL_COMMUNITY): Payer: 59

## 2010-10-28 ENCOUNTER — Other Ambulatory Visit: Payer: Self-pay

## 2010-10-28 DIAGNOSIS — E782 Mixed hyperlipidemia: Secondary | ICD-10-CM

## 2010-10-28 MED ORDER — SIMVASTATIN 40 MG PO TABS: 40.0000 mg | ORAL_TABLET | Freq: Every day | ORAL | Status: DC

## 2010-10-29 ENCOUNTER — Encounter (HOSPITAL_COMMUNITY): Payer: 59

## 2010-10-31 ENCOUNTER — Encounter (HOSPITAL_COMMUNITY): Payer: 59

## 2010-11-03 ENCOUNTER — Encounter (HOSPITAL_COMMUNITY): Payer: 59

## 2010-11-05 ENCOUNTER — Encounter (HOSPITAL_COMMUNITY): Payer: 59

## 2010-11-07 ENCOUNTER — Encounter (HOSPITAL_COMMUNITY): Payer: 59

## 2010-11-09 LAB — DIFFERENTIAL
Basophils Relative: 0 % (ref 0–1)
Eosinophils Absolute: 0 10*3/uL (ref 0.0–0.7)
Lymphs Abs: 1.2 10*3/uL (ref 0.7–4.0)
Monocytes Absolute: 0.5 10*3/uL (ref 0.1–1.0)
Monocytes Relative: 8 % (ref 3–12)
Neutrophils Relative %: 73 % (ref 43–77)

## 2010-11-09 LAB — URINALYSIS, ROUTINE W REFLEX MICROSCOPIC
Glucose, UA: NEGATIVE mg/dL
Hgb urine dipstick: NEGATIVE
Specific Gravity, Urine: 1.005 (ref 1.005–1.030)

## 2010-11-09 LAB — POCT I-STAT, CHEM 8
BUN: 15 mg/dL (ref 6–23)
Creatinine, Ser: 0.8 mg/dL (ref 0.4–1.2)
Hemoglobin: 14.6 g/dL (ref 12.0–15.0)
Potassium: 3.7 mEq/L (ref 3.5–5.1)
Sodium: 136 mEq/L (ref 135–145)

## 2010-11-09 LAB — CBC
Hemoglobin: 13.9 g/dL (ref 12.0–15.0)
MCHC: 33.6 g/dL (ref 30.0–36.0)
MCV: 90.7 fL (ref 78.0–100.0)
RBC: 4.58 MIL/uL (ref 3.87–5.11)
WBC: 6.3 10*3/uL (ref 4.0–10.5)

## 2010-11-09 LAB — POCT CARDIAC MARKERS
CKMB, poc: 1.3 ng/mL (ref 1.0–8.0)
Myoglobin, poc: 68.2 ng/mL (ref 12–200)
Myoglobin, poc: 82.4 ng/mL (ref 12–200)

## 2010-11-10 ENCOUNTER — Encounter (HOSPITAL_COMMUNITY): Payer: 59

## 2010-11-12 ENCOUNTER — Encounter (HOSPITAL_COMMUNITY): Payer: 59

## 2010-11-14 ENCOUNTER — Encounter (HOSPITAL_COMMUNITY): Payer: 59

## 2010-11-17 ENCOUNTER — Encounter (HOSPITAL_COMMUNITY): Payer: 59

## 2010-11-19 ENCOUNTER — Encounter (HOSPITAL_COMMUNITY): Payer: 59

## 2010-11-21 ENCOUNTER — Encounter (HOSPITAL_COMMUNITY): Payer: 59

## 2010-11-24 ENCOUNTER — Encounter (HOSPITAL_COMMUNITY): Payer: 59

## 2010-12-16 NOTE — Op Note (Signed)
NAME:  MARJARIE, IRION                ACCOUNT NO.:  [id number]   MEDICAL RECORD NO.:  [id number]          PATIENT TYPE:  AMB   LOCATION:  [facility]                         FACILITY:  [facility]   PHYSICIAN:  Excell Seltzer. [name], M.D.    DATE OF BIRTH:  06/15/1935   DATE OF PROCEDURE:  06/12/2008  DATE OF DISCHARGE:  06/13/2008                               OPERATIVE REPORT   PROCEDURE:  SPARC sling.   POSTOPERATIVE DIAGNOSIS:  Stress incontinence.   POSTOPERATIVE DIAGNOSIS:  Stress incontinence.   SURGEON:  Excell Seltzer. [name], M.D.   ASSISTANT:  Rande Brunt. Eda Paschal, M.D.   ANESTHESIA:  General.   DRAINS:  Foley.   COMPLICATIONS:  None.   INDICATIONS:  Ms. Hostetter is a 76 year old white female with stress  incontinence, who is to undergo a sling in conjunction with a  hysterectomy and anterior repair.   FINDINGS/PROCEDURE:  The patient had been given a general anesthetic,  placed in the lithotomy position, and given antibiotic, per Dr.  Eda Paschal.  She had undergone a laparoscopic-assisted vaginal  hysterectomy, and the anterior vaginal vault had been opened for a  cystocele repair.  At this point, I came into the procedure and placed a  SPARC sling.  Two small incisions were made just above the pubis, 1-2 cm  to the right, and 1-2 cm to the left of midline.  The [facility] trocars were  passed through these incisions beneath the pubis, hugging the underside  of the pubis. We then brought out into the vaginal incision under finger  guidance, a Foley was placed in the urethra to aid identification and to  allow avoidance of the trocars.  Once both trocars had been passed,  cystoscopy was performed, and no evidence of bladder wall injury was  identified.  Blue urine was noted in the bladder following indigo  carmine injection by Dr. Eda Paschal.  Once the trocar position had been  confirmed, the [facility] mesh was snapped to the trocars and pulled into the  proper position.  Repeat cystoscopy once again  revealed no evidence of  bladder wall injury.  The sheet was removed once the sling had been  appropriately tensioned, and the abdominal ends were trimmed, allowing  them to fall back into the subcutaneous tissue.   The Foley catheter then reinserted, and the tensin of the graft was  checked one more time.  Dr. Eda Paschal then completed the cystocele  repair and closed the vaginal vault.  The inferior abdominal wall  incisions were closed with  Dermabond.  A vaginal pack was placed.  Foley was placed to straight  drains.  The patient was taken down from the lithotomy position.  Her  anesthetic was reversed.  She was moved to the recovery room in stable  condition.  There were no complications during my portion of the  procedure.      Excell Seltzer. [name], M.D.  Electronically Signed     JJW/MEDQ  D:  06/13/2008  T:  06/13/2008  Job:  [id number]

## 2010-12-16 NOTE — Op Note (Signed)
NAME:  Kaitlyn Parks, Kaitlyn Parks                ACCOUNT NO.:  [id number]   MEDICAL RECORD NO.:  [id number]          PATIENT TYPE:  AMB   LOCATION:  [facility]                         FACILITY[facility]   PHYSICIAN:  Daniel L. Gottsegen, M.D.DATE OF BIRTH:  10/15/1934   DATE OF PROCEDURE:  06/12/2008  DATE OF DISCHARGE:                               OPERATIVE REPORT   PREOPERATIVE DIAGNOSES:  1. Uterine prolapse.  2. Cystocele.  3. Rectocele.  4. Urinary stress incontinence.   POSTOPERATIVE DIAGNOSES:  1. Uterine prolapse.  2. Cystocele.  3. Slight suggestion of her rectocele.  4. Urinary stress incontinence.   OPERATIONS:  1. Laparoscopic-assisted vaginal hysterectomy.  2. Bilateral salpingo-oophorectomy.  3. Anterior repair.  4. SPARC retropubic sling procedure.   SURGEONS:  1. Daniel L. Eda Paschal, M.D.  2. Excell Seltzer. [name], M.D.   FIRST ASSISTANT:  Juan H. Lily Peer, M.D.   FINDINGS AT SURGERY:  The patient had second-degree uterine prolapse,  3rd-degree cystocele, patient had an extremely small vaginal opening and  depth and although there might be a slight suggestion of weakness of the  posterior wall with a slight rectocele, once the other parts of the  repair was done, it was minimal and it was not felt that surgery would  do anything other than make her vagina so small that intercourse would  be uncomfortable.  She did have fairly poor sphincter tone.   INDICATIONS:  The patient is a 76 year old female who had presented to  the office with symptomatic cystocele.  Urodynamics had shown that she  did have stress incontinence with a low leak point pressure.  She also  had symptoms of uterine prolapse.  It was elected to bring her to the  operating room for correction of this, the patient has considered this  over a long period of time and her symptoms have continued to worsen.  She will undergo a laparoscopic-assisted vaginal hysterectomy, bilateral  salpingo-oophorectomy, anterior and  posterior repair and a sling  procedure.   PROCEDURE:  After adequate general endotracheal anesthesia, the patient  was placed in the dorsal supine position, prepped and draped in the  usual sterile manner.  Foley catheter was inserted into her bladder and  a Hulka catheter was inserted into her uterus.  Because we wanted to  remove her ovaries and because of her age, we were concerned that they  would be exceedingly lateral and difficult to get vaginally.  We elected  to laparoscope her first using an OptiVu and a 10-mm straight scope.  The perineal cavity was entered without trauma.  Pneumoperitoneum  created.  Two 5 mm ports were placed in the pelvis and the right and  left lower quadrant.  Extreme difficulty was obtained trying to get a  good picture.  There was moisture on the scope.  It was difficult  focusing it.  The scope appeared to be dirty.  We went through 3  different laparoscopes and 2 cameras before we finally found a  combination that allowed [place] to see properly.  At this point, the right  ovary which was exceedingly lateral and  small was elevated.  The ureter  was identified.  The tripolar instrument was used and the IP ligament  was bipolared and cut.  This was extended until the ovaries were  completely free from the broad ligament and the round ligaments were  also sacrificed using bipolar.  Attention was turned to the left side.  Once again, the ovaries were small and lateral.  It was the surgeon and  first assist's opinion that this probably could not have been done  vaginally because of the most lateral position of her ovaries.  The  ureter was identified on the left.  The IP ligament was bipolared and  cut.  The broad ligament was separated from the ovaries bipolared and  cut as was the round ligament.  Attention was next turned vaginally.  The surgeons regloved.  A 1:200,000 solution of epinephrine and 50%  Xylocaine was injected around the cervix.  A 360-degree  incision was  made around the bladder and the cervix and the posterior lip.  The  posterior peritoneum and the vesicouterine fold of peritoneum were  sharply dissected free and both the vesicouterine fold of peritoneum and  the posterior peritoneum were entered with sharp dissection.  The  uterosacral ligaments were clamped, cut and suture ligated to the vault  laterally for good vault support.  The balance of the broad ligament  including the uterine arteries were clamped, cut and suture ligated as  well.  Suture material for this part of procedure was #1 chromic catgut.  Uterus, ovaries and fallopian tubes were delivered and sent to pathology  for tissue diagnosis.  The vaginal cuff was whip stitched to the  posterior peritoneum with a running locking 0 Vicryl.  Attention was  next turned to the anterior repair.  The anterior vaginal mucosa was  undermined from the top of the cuff all the way to the urethra.  At this  point, Dr. [name] was called to do the sling.  While we were waiting for  him to come, she was relaparoscoped after changing gloves and there was  absolutely no bleeding.  Copious irrigation was done again with sterile  saline and all cul-de-sac fluid was removed.  The subumbilical fascial  incision was closed with 0 Vicryl and the 3 skin incisions were closed  with 3-0 Monocryl.  Dr. [name] did his [facility] which is dictated under  separate operative note.  The cystocele was then reduced with  approximately 8 sutures of 2-0 Vicryl.  It was noted that her fascia was  exceedingly thin and in spite of the use of estrogen, did not appear to  be of the highest quality.  Redundant vaginal mucosa was trimmed away  and then the anterior vaginal mucosa was closed, also picking up the  fascia both to eliminate dead space and to reinforce the repair.  The  cuff was then closed with figure-of-eights of 0 chromic.  A Moschcowitz-  type enterocele suture was placed which completely brought  the  uterosacral ligaments together. Two sponge, needle and instrument counts  were correct.  The patient had been given some IV carmine to be sure  that her ureters were patent and they were now draining clear urine as  the blue dye had cleared.  Dr. Lily Peer and Dr. Eda Paschal carefully  looked at the posterior wall.  The rectocele really was almost  nonexistent.  She had an extremely small vaginal canal and we both had  concerns that if we tried to repair the  slight weakness, it would make  intercourse very difficult for her at age 61, although her sphincter was  weak.  We also had some concern that if we did an inverted V incision  and tried to pull her sphincters tighter, we would also reproduce the  same issues with dyspareunia, so we elected to stop at this point and  just see if she has problems postoperatively and obviously that could be  addressed later.  Blood loss for the entire procedure was 150 mL with  none replaced.  The patient tolerated the procedure well and left the  operating room in satisfactory condition.      Daniel L. Eda Paschal, M.D.  Electronically Signed     DLG/MEDQ  D:  06/12/2008  T:  06/12/2008  Job:  [id number]

## 2010-12-24 ENCOUNTER — Other Ambulatory Visit: Payer: Self-pay | Admitting: Internal Medicine

## 2011-02-06 ENCOUNTER — Ambulatory Visit (INDEPENDENT_AMBULATORY_CARE_PROVIDER_SITE_OTHER): Payer: 59 | Admitting: Internal Medicine

## 2011-02-06 DIAGNOSIS — M810 Age-related osteoporosis without current pathological fracture: Secondary | ICD-10-CM

## 2011-02-06 MED ORDER — DENOSUMAB 60 MG/ML ~~LOC~~ SOLN
60.0000 mg | Freq: Once | SUBCUTANEOUS | Status: AC
  Administered 2011-02-06: 60 mg via SUBCUTANEOUS

## 2011-03-24 ENCOUNTER — Ambulatory Visit: Payer: Self-pay | Admitting: Internal Medicine

## 2011-04-13 ENCOUNTER — Encounter: Payer: Self-pay | Admitting: Internal Medicine

## 2011-04-13 ENCOUNTER — Ambulatory Visit (INDEPENDENT_AMBULATORY_CARE_PROVIDER_SITE_OTHER): Payer: 59 | Admitting: Internal Medicine

## 2011-04-13 VITALS — BP 132/80 | Temp 98.1°F | Ht 64.0 in | Wt 128.0 lb

## 2011-04-13 DIAGNOSIS — T887XXA Unspecified adverse effect of drug or medicament, initial encounter: Secondary | ICD-10-CM

## 2011-04-13 DIAGNOSIS — E039 Hypothyroidism, unspecified: Secondary | ICD-10-CM

## 2011-04-13 DIAGNOSIS — E785 Hyperlipidemia, unspecified: Secondary | ICD-10-CM

## 2011-04-14 LAB — LIPID PANEL
Cholesterol: 141 mg/dL (ref 0–200)
HDL: 53.3 mg/dL (ref >39.00)
Total CHOL/HDL Ratio: 3
Triglycerides: 185 mg/dL — ABNORMAL HIGH (ref 0.0–149.0)

## 2011-04-14 LAB — T3, FREE: T3, Free: 2.3 pg/mL (ref 2.3–4.2)

## 2011-04-28 ENCOUNTER — Telehealth: Payer: Self-pay | Admitting: *Deleted

## 2011-04-28 NOTE — Telephone Encounter (Signed)
Pt informed - all normal except for elevated triglycerides- told to watch carbohydrates and sweets, but all other lipids were great!

## 2011-04-28 NOTE — Telephone Encounter (Signed)
Pt would like lab results.  

## 2011-05-03 ENCOUNTER — Encounter: Payer: Self-pay | Admitting: Internal Medicine

## 2011-05-03 NOTE — Progress Notes (Signed)
  Subjective:    Patient ID: Kaitlyn Parks, female    DOB: 1934/08/12, 76 y.o.   MRN: [id number]  HPI  Patient is a 76 year old white female who presents for followup of her postoperative visit on hyperlipidemia and hypertension with a history of coronary artery disease followed by cardiology.  She denies any current chest pain she does state that she has a sense of weakness and has not been exercising.  She denies any temperature intolerance she has noted mild dry skin her nails appear to be normal and she has noticed thinning of her hair  Review of Systems  Constitutional: Negative for activity change, appetite change and fatigue.  HENT: Negative for ear pain, congestion, neck pain, postnasal drip and sinus pressure.   Eyes: Negative for redness and visual disturbance.  Respiratory: Negative for cough, shortness of breath and wheezing.   Gastrointestinal: Negative for abdominal pain and abdominal distention.  Genitourinary: Negative for dysuria, frequency and menstrual problem.  Musculoskeletal: Negative for myalgias, joint swelling and arthralgias.  Skin: Negative for rash and wound.  Neurological: Negative for dizziness, weakness and headaches.  Hematological: Negative for adenopathy. Does not bruise/bleed easily.  Psychiatric/Behavioral: Negative for sleep disturbance and decreased concentration.    Objective:   Physical Exam  Vitals reviewed. Constitutional: She is oriented to person, place, and time. She appears well-developed and well-nourished. No distress.  HENT:  Head: Normocephalic and atraumatic.  Right Ear: External ear normal.  Left Ear: External ear normal.  Nose: Nose normal.  Mouth/Throat: Oropharynx is clear and moist.  Eyes: Conjunctivae and EOM are normal. Pupils are equal, round, and reactive to light.  Neck: Normal range of motion. Neck supple. No JVD present. No tracheal deviation present. No thyromegaly present.  Cardiovascular: Normal rate, regular  rhythm, normal heart sounds and intact distal pulses.   No murmur heard. Pulmonary/Chest: Effort normal and breath sounds normal. She has no wheezes. She exhibits no tenderness.  Abdominal: Soft. Bowel sounds are normal.  Musculoskeletal: Normal range of motion. She exhibits no edema and no tenderness.  Lymphadenopathy:    She has no cervical adenopathy.  Neurological: She is alert and oriented to person, place, and time. She has normal reflexes. No cranial nerve deficit.  Skin: Skin is warm and dry. She is not diaphoretic.  Psychiatric: She has a normal mood and affect. Her behavior is normal.          Assessment & Plan:  Monitoring TSH T4 free and T3 free today and adjusting her medication based upon the laboratory results she does have some signs and symptoms that indicate that she may have some mild to moderate progressive hypothyroidism. We'll monitor lipid profile given her CAD history her target LDL C. is 70 She is on Plavix and an aspirin and will monitor CBC with differential

## 2011-07-02 ENCOUNTER — Encounter: Payer: Self-pay | Admitting: Obstetrics and Gynecology

## 2011-07-24 ENCOUNTER — Encounter: Payer: Self-pay | Admitting: Gynecology

## 2011-07-24 DIAGNOSIS — N816 Rectocele: Secondary | ICD-10-CM | POA: Insufficient documentation

## 2011-07-24 DIAGNOSIS — IMO0002 Reserved for concepts with insufficient information to code with codable children: Secondary | ICD-10-CM | POA: Insufficient documentation

## 2011-07-24 DIAGNOSIS — R32 Unspecified urinary incontinence: Secondary | ICD-10-CM | POA: Insufficient documentation

## 2011-07-24 DIAGNOSIS — N814 Uterovaginal prolapse, unspecified: Secondary | ICD-10-CM | POA: Insufficient documentation

## 2011-08-05 ENCOUNTER — Ambulatory Visit (INDEPENDENT_AMBULATORY_CARE_PROVIDER_SITE_OTHER): Payer: Medicare Other | Admitting: Obstetrics and Gynecology

## 2011-08-05 ENCOUNTER — Encounter: Payer: Self-pay | Admitting: Obstetrics and Gynecology

## 2011-08-05 VITALS — BP 124/74 | Ht 64.0 in | Wt 126.0 lb

## 2011-08-05 DIAGNOSIS — E7800 Pure hypercholesterolemia, unspecified: Secondary | ICD-10-CM | POA: Insufficient documentation

## 2011-08-05 DIAGNOSIS — M810 Age-related osteoporosis without current pathological fracture: Secondary | ICD-10-CM

## 2011-08-05 DIAGNOSIS — N393 Stress incontinence (female) (male): Secondary | ICD-10-CM

## 2011-08-05 DIAGNOSIS — E079 Disorder of thyroid, unspecified: Secondary | ICD-10-CM | POA: Insufficient documentation

## 2011-08-05 DIAGNOSIS — N390 Urinary tract infection, site not specified: Secondary | ICD-10-CM

## 2011-08-05 DIAGNOSIS — N952 Postmenopausal atrophic vaginitis: Secondary | ICD-10-CM

## 2011-08-05 NOTE — Progress Notes (Signed)
Patient came back to see me today for further followup. The first thing we discussed is her osteoporosis. She has had 2 injections now of Prolia. She has had no side effects from them. She takes calcium and vitamin D. She has had no fractures. She is due for followup bone density. She is up-to-date on mammograms. Her symptomatic pelvic relaxation has been much better since her surgery. She still has some residual stress incontinence in spite of having a sling done as well. It is not offen. It seems to be triggered by certain foods such as caffeine and wine. She does use estrogen for her vaginal dryness with good results. She is having no vaginal bleeding. She is having no pelvic pain. She does get frequent urinary tract infections. She was just recently treated. We will recheck her urine today. She currently does not have dysuria, frequency, or urgency.  ROS: 12 system review done. See Pertinent positives above. Other positives include hypothyroidism, hypertension, hyperlipidemia, degenerative disease of L4 and L5,osteoarthritis,and mitral valve prolapse.  HEENT: Within normal limits.  Kennon Portela present. Neck: No masses. Supraclavicular lymph nodes: Not enlarged. Breasts: Examined in both sitting and lying position. Symmetrical without skin changes or masses. Abdomen: Soft no masses guarding or rebound. No hernias. Pelvic: External within normal limits. BUS within normal limits. Vaginal examination shows good estrogen effect, no cystocele enterocele or rectocele. Cervix and uterus absent. Adnexa within normal limits. Rectovaginal confirmatory. Extremities within normal limits.  Assessment: #1. Osteoporosis #2. Urinary stress incontinence #3. Atrophic vaginitis #4. Urinary tract infections  Plan: Continue Prolia. Continue yearly mammograms. Continue vaginal estrogen. Discussed other treatment for stress incontinence with Dr. [name].

## 2011-08-06 ENCOUNTER — Encounter: Payer: Self-pay | Admitting: *Deleted

## 2011-08-06 NOTE — Progress Notes (Signed)
Patient ID: Kaitlyn Parks, female   DOB: 1935/05/11, 77 y.o.   MRN: [id number] Pt called stating she needs order for bone density at [facility], pt was told that she should contact [facility] and they will fax order to office.

## 2011-08-10 ENCOUNTER — Ambulatory Visit: Payer: Medicare Other | Admitting: Cardiology

## 2011-08-13 ENCOUNTER — Ambulatory Visit (INDEPENDENT_AMBULATORY_CARE_PROVIDER_SITE_OTHER): Payer: Medicare Other | Admitting: Internal Medicine

## 2011-08-13 DIAGNOSIS — M810 Age-related osteoporosis without current pathological fracture: Secondary | ICD-10-CM

## 2011-08-13 MED ORDER — DENOSUMAB 60 MG/ML ~~LOC~~ SOLN
60.0000 mg | Freq: Once | SUBCUTANEOUS | Status: AC
  Administered 2011-08-13: 60 mg via SUBCUTANEOUS

## 2011-08-19 ENCOUNTER — Telehealth: Payer: Self-pay | Admitting: *Deleted

## 2011-08-19 NOTE — Telephone Encounter (Signed)
Per dr Lovell Sheehan- she needs to wait until sx are gone

## 2011-08-19 NOTE — Telephone Encounter (Signed)
Notified pt. 

## 2011-08-19 NOTE — Telephone Encounter (Signed)
Pt states she is having stomach cramps, headache, and nausea.  No fever. Is asking if she can have a flu shot?

## 2011-08-20 ENCOUNTER — Ambulatory Visit (INDEPENDENT_AMBULATORY_CARE_PROVIDER_SITE_OTHER): Payer: Medicare Other | Admitting: Cardiology

## 2011-08-20 ENCOUNTER — Encounter: Payer: Self-pay | Admitting: Cardiology

## 2011-08-20 VITALS — BP 135/80 | HR 63 | Ht 64.0 in | Wt 128.0 lb

## 2011-08-20 DIAGNOSIS — E7800 Pure hypercholesterolemia, unspecified: Secondary | ICD-10-CM

## 2011-08-20 DIAGNOSIS — I2510 Atherosclerotic heart disease of native coronary artery without angina pectoris: Secondary | ICD-10-CM

## 2011-08-20 DIAGNOSIS — I10 Essential (primary) hypertension: Secondary | ICD-10-CM

## 2011-08-20 NOTE — Progress Notes (Signed)
   HPI The patient presents for one year follow up.  Since I last saw her she has done well.  The patient denies any new symptoms such as chest discomfort, neck or arm discomfort. There has been no new shortness of breath, PND or orthopnea. There have been no reported palpitations, presyncope or syncope.  She is active but not exercising.  No Known Allergies  Current Outpatient Prescriptions  Medication Sig Dispense Refill  . aspirin 81 MG tablet Take 81 mg by mouth daily.        Marland Kitchen ESTRADIOL [facility] vaginally.        Marland Kitchen PLAVIX 75 MG tablet TAKE 1 TABLET ONCE DAILY WITH FOOD.  30 each  6  . simvastatin (ZOCOR) 40 MG tablet Take 1 tablet (40 mg total) by mouth at bedtime.  90 tablet  3  . SYNTHROID 50 MCG tablet TAKE 1 TABLET EACH DAY.  90 each  3    Past Medical History  Diagnosis Date  . CAD (coronary artery disease)   . Cystocele   . Rectocele   . Urinary incontinence     USI  . Osteoporosis   . Uterine prolapse   . Elevated cholesterol   . Thyroid disease     Hypothyroid  . Scoliosis     Past Surgical History  Procedure Date  . Vaginal hysterectomy     LAVH BSO  . Oophorectomy     BSO  . Bladder suspension   . Coronary angioplasty with stent placement     ROS:  Back pain.  Otherwise as stated in the HPI and negative for all other systems.  PHYSICAL EXAM BP 135/80  Pulse 63  Ht 5\' 4"  (1.626 m)  Wt 128 lb (58.06 kg)  BMI 21.97 kg/m2 GENERAL:  Well appearing HEENT:  Pupils equal round and reactive, fundi not visualized, oral mucosa unremarkable NECK:  No jugular venous distention, waveform within normal limits, carotid upstroke brisk and symmetric, no bruits, no thyromegaly LYMPHATICS:  No cervical, inguinal adenopathy LUNGS:  Clear to auscultation bilaterally BACK:  No CVA tenderness CHEST:  Unremarkable HEART:  PMI not displaced or sustained,S1 and S2 within normal limits, no S3, no S4, no clicks, no rubs, no murmurs ABD:  Flat, positive bowel sounds normal in  frequency in pitch, no bruits, no rebound, no guarding, no midline pulsatile mass, no hepatomegaly, no splenomegaly EXT:  2 plus pulses throughout, no edema, no cyanosis no clubbing SKIN:  No rashes no nodules NEURO:  Cranial nerves II through XII grossly intact, motor grossly intact throughout PSYCH:  Cognitively intact, oriented to person place and time  EKG:  Sinus rhythm, rate 63, axis within normal limits, intervals within normal limits, no acute ST-T wave changes.   ASSESSMENT AND PLAN

## 2011-08-20 NOTE — Assessment & Plan Note (Signed)
The blood pressure is at target. No change in medications is indicated. We will continue with therapeutic lifestyle changes (TLC).  

## 2011-08-20 NOTE — Patient Instructions (Signed)
Continue current medications as listed.  Follow up in 1 year with Dr Hochrein.  You will receive a letter in the mail 2 months before you are due.  Please call us when you receive this letter to schedule your follow up appointment.  

## 2011-08-20 NOTE — Assessment & Plan Note (Signed)
Her lipid profile was excellent.  She will continue the meds as listed.

## 2011-08-20 NOTE — Assessment & Plan Note (Signed)
The patient has no new sypmtoms.  No further cardiovascular testing is indicated.  We will continue with aggressive risk reduction.  She can stop her Plavix

## 2011-09-20 ENCOUNTER — Other Ambulatory Visit: Payer: Self-pay

## 2011-09-20 ENCOUNTER — Encounter (HOSPITAL_COMMUNITY): Payer: Self-pay

## 2011-09-20 ENCOUNTER — Emergency Department (HOSPITAL_COMMUNITY)
Admission: EM | Admit: 2011-09-20 | Discharge: 2011-09-20 | Disposition: A | Discharge status: Home / Self Care | Payer: Medicare Other | Source: Home / Self Care | Attending: Emergency Medicine | Admitting: Emergency Medicine

## 2011-09-20 ENCOUNTER — Emergency Department (INDEPENDENT_AMBULATORY_CARE_PROVIDER_SITE_OTHER): Payer: Medicare Other

## 2011-09-20 DIAGNOSIS — M546 Pain in thoracic spine: Secondary | ICD-10-CM

## 2011-09-20 MED ORDER — HYDROCODONE-ACETAMINOPHEN 5-325 MG PO TABS: 1.0000 | ORAL_TABLET | Freq: Four times a day (QID) | ORAL | Status: AC | PRN

## 2011-09-20 NOTE — ED Notes (Signed)
Started having dizziness today.  Her blood pressure is elevated today, and the pt. states it has never been this high.  Has had a cold past week.  Took cold medicine last week.

## 2011-09-20 NOTE — ED Provider Notes (Addendum)
History     CSN: [id number]  Arrival date & time 09/20/11  1645   First MD Initiated Contact with Patient 09/20/11 1702      Chief Complaint  Patient presents with  . Dizziness    Started having dizziness today.  Her blood pressure is elevated today, and the pt. states it has never been this high.  Has had a cold past week.  Took cold medicine last week.      (Consider location/radiation/quality/duration/timing/severity/associated sxs/prior treatment) HPI Comments: Patient presents to urgent care complaining of feeling dizzy today feels better now also wondering why she still coughing after 2 weeks. No fevers no shortness of breath been taking Tylenol up congestion has improved.  Patient fell about 3 days ago she slipped left side had been sore on her left upper back. Patient continues to cough mainly at night. No fevers and no shortness of breath no further symptoms.  Patient also describes in the morning felt somewhat dizzy and took her blood pressure monitor blood pressure was 160s over 90s was concern decided to come in for recheck of. Patient denies any headaches paresthesias or sudden weakness of any extremities no nausea no vomiting no visual changes  The history is provided by the patient and the spouse.    Past Medical History  Diagnosis Date  . CAD (coronary artery disease)   . Cystocele   . Rectocele   . Urinary incontinence     USI  . Osteoporosis   . Uterine prolapse   . Elevated cholesterol   . Thyroid disease     Hypothyroid  . Scoliosis     Past Surgical History  Procedure Date  . Vaginal hysterectomy     LAVH BSO  . Oophorectomy     BSO  . Bladder suspension   . Coronary angioplasty with stent placement     Family History  Problem Relation Age of Onset  . Hypertension Mother   . Heart disease Mother   . Heart disease Father   . Heart disease Sister   . Colon cancer Paternal Aunt   . Breast cancer Paternal Aunt   . Diabetes Sister   . Breast  cancer Cousin     Maternal 1st cousins    History  Substance Use Topics  . Smoking status: Never Smoker   . Smokeless tobacco: Never Used  . Alcohol Use: 1.2 oz/week    2 Glasses of wine per week    OB History    Grav Para Term Preterm Abortions TAB SAB Ect Mult Living   2 2 2       2       Review of Systems  Constitutional: Negative for chills, appetite change and fatigue.  HENT: Positive for congestion. Negative for ear pain.   Respiratory: Positive for cough. Negative for shortness of breath.   Cardiovascular: Negative for chest pain.  Neurological: Positive for dizziness. Negative for light-headedness and headaches.    Allergies  Review of patient's allergies indicates no known allergies.  Home Medications   Current Outpatient Rx  Name Route Sig Dispense Refill  . ASPIRIN 81 MG PO TABS Oral Take 81 mg by mouth daily.      Marland Kitchen ESTRADIOL [facility] vaginally.      Marland Kitchen SIMVASTATIN 40 MG PO TABS Oral Take 1 tablet (40 mg total) by mouth at bedtime. 90 tablet 3  . SYNTHROID 50 MCG PO TABS  TAKE 1 TABLET EACH DAY. 90 each 3  . HYDROCODONE-ACETAMINOPHEN  5-325 MG PO TABS Oral Take 1 tablet by mouth every 6 (six) hours as needed for pain. 10 tablet 0    BP 173/77  Pulse 76  Temp(Src) 98.4 F (36.9 C) (Oral)  Resp 20  SpO2 100%  Physical Exam  Nursing note and vitals reviewed. Constitutional: She appears well-developed and well-nourished. No distress.  HENT:  Head: Normocephalic.  Eyes: Conjunctivae are normal.  Neck: Neck supple. No JVD present. No thyromegaly present.  Cardiovascular: Normal rate and regular rhythm.  Exam reveals no gallop and no friction rub.   No murmur heard. Pulmonary/Chest: Effort normal. No respiratory distress. She has no decreased breath sounds. She has no wheezes. She has no rales.   She exhibits no tenderness.  Lymphadenopathy:    She has no cervical adenopathy.  Skin: Skin is warm. No abrasion, no bruising, no ecchymosis and no  rash noted. No erythema.       ED Course  Procedures (including critical care time)  Labs Reviewed - No data to display Dg Chest 2 View  09/20/2011  *RADIOLOGY REPORT*  Clinical Data: Dizziness.  Productive cough for 2 weeks.  CHEST - 2 VIEW  Comparison: 03/20/2010.  Findings: Emphysema with bilateral pleural apical scarring. Cardiopericardial silhouette within normal limits.  No airspace disease.  No effusion.  Scoliosis is unchanged.  IMPRESSION: Emphysema without acute cardiopulmonary disease.  Original Report Authenticated By: Andreas Newport, M.D.     1. Thoracic back pain       MDM  Patient most symptomatic complaining of sudden onset of dizziness no results 100 coexistent elevated blood pressure also with left-sided posterior upper back pain reproducible with physical maneuvers. Ongoing cough has paresis, cold symptoms.. Patient looks comfortable EKG and x-rays were unremarkable for acute conditions such as coronary events pneumothorax pneumomediastinum.        Jimmie Molly, MD 09/20/11 2007  Jimmie Molly, MD 09/20/11 2007

## 2011-09-29 ENCOUNTER — Telehealth: Payer: Self-pay | Admitting: *Deleted

## 2011-09-29 MED ORDER — AZITHROMYCIN 250 MG PO TABS: ORAL_TABLET | ORAL | Status: AC

## 2011-09-29 NOTE — Telephone Encounter (Signed)
Pt.notified

## 2011-09-29 NOTE — Telephone Encounter (Signed)
Per dr jenkins- may have a zpack 

## 2011-09-29 NOTE — Telephone Encounter (Signed)
Pt. Has had a sore throat for 3 weeks.  It started with URI, and cough, but that is better.  She went to Urgent Care once and they only gave her pain meds, which she did not get it.  She would like to have an antibiotic, and does not want to see anyone if they are not going to give her one.  No fever.

## 2011-12-21 ENCOUNTER — Other Ambulatory Visit: Payer: Self-pay | Admitting: Internal Medicine

## 2012-01-07 ENCOUNTER — Other Ambulatory Visit: Payer: Self-pay | Admitting: Cardiology

## 2012-01-07 NOTE — Telephone Encounter (Signed)
..   Requested Prescriptions   Pending Prescriptions Disp Refills  . simvastatin (ZOCOR) 40 MG tablet [Pharmacy Med Name: SIMVASTATIN 40 MG TABLET] 90 tablet 3    Sig: TAKE ONE TABLET AT BEDTIME.

## 2012-01-26 ENCOUNTER — Ambulatory Visit (INDEPENDENT_AMBULATORY_CARE_PROVIDER_SITE_OTHER): Payer: Medicare Other | Admitting: Cardiology

## 2012-01-26 ENCOUNTER — Encounter: Payer: Self-pay | Admitting: Cardiology

## 2012-01-26 VITALS — BP 142/70 | HR 62 | Ht 64.0 in | Wt 127.0 lb

## 2012-01-26 DIAGNOSIS — R079 Chest pain, unspecified: Secondary | ICD-10-CM

## 2012-01-26 DIAGNOSIS — I10 Essential (primary) hypertension: Secondary | ICD-10-CM

## 2012-01-26 DIAGNOSIS — I2510 Atherosclerotic heart disease of native coronary artery without angina pectoris: Secondary | ICD-10-CM

## 2012-01-26 NOTE — Patient Instructions (Addendum)
The current medical regimen is effective;  continue present plan and medications.  Your physician has requested that you have an exercise tolerance test. For further information please visit [email or web address]. Please also follow instruction sheet, as given.   

## 2012-01-26 NOTE — Assessment & Plan Note (Signed)
Her blood pressures very slightly elevated. However, this is unusual for her. No change in therapy is indicated.

## 2012-01-26 NOTE — Progress Notes (Signed)
   HPI The patient presents for followup up of chest pain.  She called to be added to the schedule because she had some chest discomfort about 8 days ago. This happened after she does water aerobics and then golf outside for 15 holes. It was very warm day. Later that evening she had some chest heaviness.  This was somewhat mild. He was somewhat similar to previous angina but not nearly as intense. It happened for possibly an hour before going away on its own. There were no associated symptoms.  No his no nausea or vomiting. She had no diaphoresis. She since not had any of this discomfort and she's been back to doing some of her exercise. She was concerned because she[place] getting [place].  No Known Allergies  Current Outpatient Prescriptions  Medication Sig Dispense Refill  . aspirin 81 MG tablet Take 81 mg by mouth daily.        Marland Kitchen ESTRADIOL [facility] vaginally as needed.       . simvastatin (ZOCOR) 40 MG tablet TAKE ONE TABLET AT BEDTIME.  90 tablet  3  . SYNTHROID 50 MCG tablet TAKE 1 TABLET EACH DAY.  90 each  1    Past Medical History  Diagnosis Date  . CAD (coronary artery disease)   . Cystocele   . Rectocele   . Urinary incontinence     USI  . Osteoporosis   . Uterine prolapse   . Elevated cholesterol   . Thyroid disease     Hypothyroid  . Scoliosis     Past Surgical History  Procedure Date  . Vaginal hysterectomy     LAVH BSO  . Oophorectomy     BSO  . Bladder suspension   . Coronary angioplasty with stent placement     ROS:  Back pain.  Otherwise as stated in the HPI and negative for all other systems.  PHYSICAL EXAM BP 142/70  Pulse 62  Ht 5\' 4"  (1.626 m)  Wt 127 lb (57.607 kg)  BMI 21.80 kg/m2 GENERAL:  Well appearing HEENT:  Pupils equal round and reactive, fundi not visualized, oral mucosa unremarkable NECK:  No jugular venous distention, waveform within normal limits, carotid upstroke brisk and symmetric, no bruits, no  thyromegaly LYMPHATICS:  No cervical, inguinal adenopathy LUNGS:  Clear to auscultation bilaterally BACK:  No CVA tenderness CHEST:  Unremarkable HEART:  PMI not displaced or sustained,S1 and S2 within normal limits, no S3, no S4, no clicks, no rubs, no murmurs ABD:  Flat, positive bowel sounds normal in frequency in pitch, no bruits, no rebound, no guarding, no midline pulsatile mass, no hepatomegaly, no splenomegaly EXT:  2 plus pulses throughout, no edema, no cyanosis no clubbing   EKG:  Sinus rhythm, rate 63, axis within normal limits, intervals within normal limits, no acute ST-T wave changes.   ASSESSMENT AND PLAN

## 2012-01-26 NOTE — Assessment & Plan Note (Signed)
Her pain was somewhat atypical. However, I would like to perform an exercise treadmill test prior to her travel to make sure there are no high-risk findings.

## 2012-01-29 ENCOUNTER — Encounter: Payer: Self-pay | Admitting: Physician Assistant

## 2012-01-29 ENCOUNTER — Ambulatory Visit (INDEPENDENT_AMBULATORY_CARE_PROVIDER_SITE_OTHER): Payer: Medicare Other | Admitting: Physician Assistant

## 2012-01-29 DIAGNOSIS — R079 Chest pain, unspecified: Secondary | ICD-10-CM

## 2012-01-29 NOTE — Procedures (Signed)
Exercise Treadmill Test  Pre-Exercise Testing Evaluation Rhythm: normal sinus  Rate: 69   PR:  .19 QRS:  .09  QT:  .43 QTc: .46     Test  Exercise Tolerance Test Ordering MD: Angelina Sheriff, MD  Interpreting MD: Tereso Newcomer PA-C  Unique Test No: 1  Treadmill:  1  Indication for ETT: chest pain - rule out ischemia  Contraindication to ETT: No   Stress Modality: exercise - treadmill  Cardiac Imaging Performed: non   Protocol: standard Bruce - maximal  Max BP:  213/74  Max MPHR (bpm):  146 85% MPR (bpm):  124  MPHR obtained (bpm):  139 % MPHR obtained:  94%  Reached 85% MPHR (min:sec):  3:45 Total Exercise Time (min-sec):  6:00  Workload in METS:  7.0 Borg Scale: 15  Reason ETT Terminated:  patient's desire to stop    ST Segment Analysis At Rest: normal ST segments - no evidence of significant ST depression With Exercise: non-specific ST changes  Other Information Arrhythmia:  No Angina during ETT:  absent (0) Quality of ETT:  diagnostic  ETT Interpretation:  normal - no evidence of ischemia by ST analysis  Comments: Fair exercise tolerance. No chest pain. Normal BP response to exercise. No ST-T changes to suggest ischemia.   Recommendations: Follow up with Dr. Rollene Rotunda as directed. Tereso Newcomer, PA-C  3:24 PM 01/29/2012

## 2012-02-22 ENCOUNTER — Ambulatory Visit (INDEPENDENT_AMBULATORY_CARE_PROVIDER_SITE_OTHER): Payer: Medicare Other | Admitting: Internal Medicine

## 2012-02-22 DIAGNOSIS — M810 Age-related osteoporosis without current pathological fracture: Secondary | ICD-10-CM

## 2012-02-22 MED ORDER — DENOSUMAB 60 MG/ML ~~LOC~~ SOLN
60.0000 mg | Freq: Once | SUBCUTANEOUS | Status: AC
  Administered 2012-02-22: 60 mg via SUBCUTANEOUS

## 2012-03-31 ENCOUNTER — Encounter: Payer: Self-pay | Admitting: Cardiology

## 2012-03-31 ENCOUNTER — Ambulatory Visit (INDEPENDENT_AMBULATORY_CARE_PROVIDER_SITE_OTHER): Payer: Medicare Other | Admitting: Cardiology

## 2012-03-31 VITALS — BP 125/70 | HR 67 | Ht 64.0 in | Wt 126.1 lb

## 2012-03-31 DIAGNOSIS — I2510 Atherosclerotic heart disease of native coronary artery without angina pectoris: Secondary | ICD-10-CM

## 2012-03-31 DIAGNOSIS — E7800 Pure hypercholesterolemia, unspecified: Secondary | ICD-10-CM

## 2012-03-31 MED ORDER — PRAVASTATIN SODIUM 80 MG PO TABS: 80.0000 mg | ORAL_TABLET | Freq: Every evening | ORAL | Status: DC

## 2012-03-31 NOTE — Patient Instructions (Addendum)
Please start Pravastatin 80 mg a day Continue all other medications as listed.  Please have blood work in 8 weeks (fasting lipid profile)  Follow up in 6 months with Dr Antoine Poche.  You will receive a letter in the mail 2 months before you are due.  Please call us when you receive this letter to schedule your follow up appointment.

## 2012-03-31 NOTE — Progress Notes (Signed)
   HPI The patient presents for followup up of CAD.  When I last saw her she had some atypical chest and this was followed with a treadmill test which was normal. Following this she went on a cruise. She forgot to take her simvastatin and noticed that she had none of her usual symptoms consistent with interstitial cystitis. When she came home she went back on the medicine and her symptoms returned. She came off with resolution again and recurrence when she started it yet again. She comes today to discuss switching to a different medication. She's actually otherwise done well. She's not been having any chest pressure, neck or arm discomfort. She's not been having any palpitations, presyncope or syncope. She's had no PND or orthopnea.   No Known Allergies  Current Outpatient Prescriptions  Medication Sig Dispense Refill  . aspirin 81 MG tablet Take 81 mg by mouth daily.        Marland Kitchen ESTRADIOL [facility] vaginally as needed.       Marland Kitchen SYNTHROID 50 MCG tablet TAKE 1 TABLET EACH DAY.  90 each  1    Past Medical History  Diagnosis Date  . CAD (coronary artery disease)     LAD stenting of a 90% lesion 2000 the left  . Cystocele   . Rectocele   . Urinary incontinence     USI  . Osteoporosis   . Uterine prolapse   . Elevated cholesterol   . Thyroid disease     Hypothyroid  . Scoliosis     Past Surgical History  Procedure Date  . Vaginal hysterectomy     LAVH BSO  . Oophorectomy     BSO  . Bladder suspension   . Coronary angioplasty with stent placement     ROS:  As stated in the HPI and negative for all other systems.  PHYSICAL EXAM BP 125/70  Pulse 67  Ht 5\' 4"  (1.626 m)  Wt 126 lb 1.9 oz (57.208 kg)  BMI 21.65 kg/m2 GENERAL:  Well appearing HEENT:  Pupils equal round and reactive, fundi not visualized, oral mucosa unremarkable NECK:  No jugular venous distention, waveform within normal limits, carotid upstroke brisk and symmetric, no bruits, no thyromegaly LYMPHATICS:  No cervical,  inguinal adenopathy LUNGS:  Clear to auscultation bilaterally BACK:  No CVA tenderness CHEST:  Unremarkable HEART:  PMI not displaced or sustained,S1 and S2 within normal limits, no S3, no S4, no clicks, no rubs, no murmurs ABD:  Flat, positive bowel sounds normal in frequency in pitch, no bruits, no rebound, no guarding, no midline pulsatile mass, no hepatomegaly, no splenomegaly EXT:  2 plus pulses throughout, no edema, no cyanosis no clubbing   ASSESSMENT AND PLAN   CAD, NATIVE VESSEL -  The patient has had no new symptoms. Her treadmill test was negative. No change in therapy is indicated.  HYPERTENSION -  The blood pressure is at target. No change in medications is indicated. We will continue with therapeutic lifestyle changes (TLC).  Hyperlipidemia - I will that the simvastatin was related to her symptoms. She should stay off of this medication and she agrees to try pravastatin 80 mg. Check a lipid profile in 8 weeks.

## 2012-05-25 ENCOUNTER — Other Ambulatory Visit (INDEPENDENT_AMBULATORY_CARE_PROVIDER_SITE_OTHER): Payer: Medicare Other

## 2012-05-25 DIAGNOSIS — E7800 Pure hypercholesterolemia, unspecified: Secondary | ICD-10-CM

## 2012-05-25 LAB — LIPID PANEL
HDL: 49.4 mg/dL (ref >39.00)
VLDL: 13.4 mg/dL (ref 0.0–40.0)

## 2012-05-25 LAB — LDL CHOLESTEROL, DIRECT: Direct LDL: 140.6 mg/dL

## 2012-05-30 ENCOUNTER — Telehealth: Payer: Self-pay | Admitting: Cardiology

## 2012-05-30 DIAGNOSIS — I2510 Atherosclerotic heart disease of native coronary artery without angina pectoris: Secondary | ICD-10-CM

## 2012-05-30 DIAGNOSIS — E7800 Pure hypercholesterolemia, unspecified: Secondary | ICD-10-CM

## 2012-05-30 MED ORDER — ROSUVASTATIN CALCIUM 20 MG PO TABS: 20.0000 mg | ORAL_TABLET | Freq: Every day | ORAL | Status: DC

## 2012-05-30 NOTE — Telephone Encounter (Signed)
Spoke with pt. Pt states she has not been able to tolerate pravachol or zocor due to problems with cystitis. She agreed to begin crestor 20mg  daily. She will return for fasting lipid/liver profile 07/25/12.

## 2012-05-30 NOTE — Telephone Encounter (Signed)
Pt calling for results of cholesterol form blood test last week

## 2012-06-04 ENCOUNTER — Other Ambulatory Visit: Payer: Self-pay | Admitting: Internal Medicine

## 2012-06-22 ENCOUNTER — Telehealth: Payer: Self-pay | Admitting: Internal Medicine

## 2012-06-22 NOTE — Telephone Encounter (Signed)
Caller: Kaitlyn Parks/Patient; Phone: [phone]; Reason for Call: Patient and her husband are patients of Dr.  Lovell Sheehan.  They are interested in getting immunized for the shingles.  She called their insurance-[employer] and the person she spoke to told her that what she was asking about was not specific enough for them to give her cost.  She would like someone to call her back regarding the cost of the vaccine or their co-pay.  Call back number is [phone]

## 2012-06-22 NOTE — Telephone Encounter (Signed)
Returned call to pt, spoke with her husband and advised shingles vaccine coverage available under part D prescription coverage.  Ckecked coverage via [employer] and the current cost for pt is $40.  Advised this can and may change between now and the time the patient comes in for vaccine.

## 2012-07-05 ENCOUNTER — Encounter: Payer: Self-pay | Admitting: Obstetrics and Gynecology

## 2012-07-25 ENCOUNTER — Other Ambulatory Visit (INDEPENDENT_AMBULATORY_CARE_PROVIDER_SITE_OTHER): Payer: Medicare Other

## 2012-07-25 DIAGNOSIS — E7800 Pure hypercholesterolemia, unspecified: Secondary | ICD-10-CM

## 2012-07-25 DIAGNOSIS — I2510 Atherosclerotic heart disease of native coronary artery without angina pectoris: Secondary | ICD-10-CM

## 2012-07-25 LAB — HEPATIC FUNCTION PANEL
ALT: 17 U/L (ref 0–35)
Bilirubin, Direct: 0 mg/dL (ref 0.0–0.3)
Total Bilirubin: 0.4 mg/dL (ref 0.3–1.2)

## 2012-07-25 LAB — LIPID PANEL
Cholesterol: 127 mg/dL (ref 0–200)
LDL Cholesterol: 57 mg/dL (ref 0–99)
Triglycerides: 57 mg/dL (ref 0.0–149.0)

## 2012-08-01 ENCOUNTER — Encounter: Payer: Self-pay | Admitting: *Deleted

## 2012-08-10 ENCOUNTER — Other Ambulatory Visit: Payer: Self-pay | Admitting: *Deleted

## 2012-08-10 ENCOUNTER — Ambulatory Visit (INDEPENDENT_AMBULATORY_CARE_PROVIDER_SITE_OTHER): Payer: Medicare Other | Admitting: Women's Health

## 2012-08-10 ENCOUNTER — Encounter: Payer: Self-pay | Admitting: Women's Health

## 2012-08-10 VITALS — BP 126/80 | Ht 63.0 in | Wt 127.0 lb

## 2012-08-10 DIAGNOSIS — N952 Postmenopausal atrophic vaginitis: Secondary | ICD-10-CM

## 2012-08-10 NOTE — Patient Instructions (Addendum)
Pnuemovac, and zostavac vaccines Vit D 2000 daily Health Recommendations for Postmenopausal Women Based on the Results of the Women's Health Initiative [facility]) and Other Studies The WHI is a major 15-year research program to address the most common causes of death, disability and poor quality of life in postmenopausal women. Some of these causes are heart disease, cancer, bone loss (osteoporosis) and others. Taking into account all of the findings from [facility] and other studies, here are bottom-line health recommendations for women: CARDIOVASCULAR DISEASE Heart Disease: A heart attack is a medical emergency. Know the signs and symptoms of a heart attack. Hormone therapy should not be used to prevent heart disease. In women with heart disease, hormone therapy should not be used to prevent further disease. Hormone therapy increases the risk of blood clots. Below are things women can do to reduce their risk for heart disease.   Do not smoke. If you smoke, quit. Women who smoke are 2 to 6 times more likely to suffer a heart attack than non-smoking women.  Aim for a healthy weight. Being overweight causes many preventable deaths. Eat a healthy and balanced diet and drink an adequate amount of liquids.  Get moving. Make a commitment to be more physically active. Aim for 30 minutes of activity on most, if not all days of the week.  Eat for heart health. Choose a diet that is low in saturated fat, trans fat, and cholesterol. Include whole grains, vegetables, and fruits. Read the labels on the food container before buying it.  Know your numbers. Ask your caregiver to check your blood pressure, cholesterol (total, HDL, LDL, triglycerides) and blood glucose. Work with your caregiver to improve any numbers that are not normal.  High blood pressure. Limit or stop your table salt intake (try salt substitute and food seasonings), avoid salty foods and drinks. Read the labels on the food container before buying it.  Avoid becoming overweight by eating well and exercising. STROKE  Stroke is a medical emergency. Stroke can be the result of a blood clot in the blood vessel in the brain or by a brain hemorrhage (bleeding). Know the signs and symptoms of a stroke. To lower the risk of developing a stroke:  Avoid fatty foods.  Quit smoking.  Control your diabetes, blood pressure, and irregular heart rate. THROMBOPHLIBITIS (BLOOD CLOT) OF THE LEG  Hormone treatment is a big cause of developing blood clots in the leg. Becoming overweight and leading a stationary lifestyle also may contribute to developing blood clots. Controlling your diet and exercising will help lower the risk of developing blood clots. CANCER SCREENING  Breast Cancer: Women should take steps to reduce their risk of breast cancer. This includes having regular mammograms, monthly self breast exams and regular breast exams by your caregiver. Have a mammogram every one to two years if you are 63 to 76 years old. Have a mammogram annually if you are 56 years old or older depending on your risk factors. Women who are high risk for breast cancer may need more frequent mammograms. There are tests available (testing the genes in your body) if you have family history of breast cancer called BRCA 1 and 2. These tests can help determine the risks of developing breast cancer.  Intestinal or Stomach Cancer: Women should talk to their caregiver about when to start screening, what tests and how often they should be done, and the benefits and risks of doing these tests. Tests to consider are a rectal exam, fecal occult blood,  sigmoidoscopy, colononoscoby, barium enema and upper GI series of the stomach. Depending on the age, you may want to get a medical and family history of colon cancer. Women who are high risk may need to be screened at an earlier age and more often.  Cervical Cancer: A Pap test of the cervix should be done every year and every 3 years when there  has been three straight years of a normal Pap test. Women with an abnormal Pap test should be screened more often or have a cervical biopsy depending on your caregiver's recommendation.  Uterine Cancer: If you have vaginal bleeding after you are in the menopause, it should be evaluated by your caregiver.  Ovarian cancer: There are no reliable tests available to screen for ovarian cancer at this time except for yearly pelvic exams.  Lung Cancer: Yearly chest X-rays can detect lung cancer and should be done on high risk women, such as cigarette smokers and women with chronic lung disease (emphysemia).  Skin Cancer: A complete body skin exam should be done at your yearly examination. Avoid overexposure to the sun and ultraviolet light lamps. Use a strong sun block cream when in the sun. All of these things are important in lowering the risk of skin cancer. MENOPAUSE Menopause Symptoms: Hormone therapy products are effective for treating symptoms associated with menopause:  Moderate to severe hot flashes.  Night sweats.  Mood swings.  Headaches.  Tiredness.  Loss of sex drive.  Insomnia.  Other symptoms. However, hormone therapy products carry serious risks, especially in older women. Women who use or are thinking about using estrogen or estrogen with progestin treatments should discuss that with their caregiver. Your caregiver will know if the benefits outweigh the risks. The Food and Drug Administration (FDA) has concluded that hormone therapy should be used only at the lowest doses and for the shortest amount of time to reach treatment goals. It is not known at what doses there may be less risk of serious side effects. There are other treatments such as herbal medication (not controlled or regulated by the FDA), group therapy, counseling and acupuncture that may be helpful. OSTEOPOROSIS Protecting Against Bone Loss and Preventing Fracture: If hormone therapy is used for prevention of bone  loss (osteoporosis), the risks for bone loss must outweigh the risk of the therapy. Women considering taking hormone therapy for bone loss should ask their health care providers about other medications (fosamax and boniva) that are considered safe and effective for preventing bone loss and bone fractures. To guard against bone loss or fractures, it is recommended that women should take at least 1000-1500 mg of calcium and 400-800 IU of vitamin D daily in divided doses. Smoking and excessive alcohol intake increases the risk of osteoporosis. Eat foods rich in calcium and vitamin D and do weight bearing exercises several times a week as your caregiver suggests. DIABETES Diabetes Melitus: Women with Type I or Type 2 diabetes should keep their diabetes in control with diet, exercise and medication. Avoid too many sweets, starchy and fatty foods. Being overweight can affect your diabetes. COGNITION AND MEMORY Cognition and Memory: Menopausal hormone therapy is not recommended for the prevention of cognitive disorders such as Alzheimer's disease or memory loss. WHI found that women treated with hormone therapy have a greater risk of developing dementia.  DEPRESSION  Depression may occur at any age, but is common in elderly women. The reasons may be because of physical, medical, social (loneliness), financial and/or economic problems and needs.  Becoming involved with church, volunteer or social groups, seeking treatment for any physical or medical problems is recommended. Also, look into getting professional advice for any economic or financial problems. ACCIDENTS  Accidents are common and can be serious in the elderly woman. Prepare your house to prevent accidents. Eliminate throw rugs, use hip protectors, place hand bars in the bath, shower and toilet areas. Avoid wearing high heel shoes and walking on wet, snowy and icy areas. Stop driving if you have vision, hearing problems or are unsteady with you movements  and reflexes. RHEUMATOID ARTHRITIS Rheumatoid arthritis causes pain, swelling and stiffness of your bone joints. It can limit many of your activities. Over-the-counter medications may help, but prescription medications may be necessary. Talk with your caregiver about this. Exercise (walking, water aerobics), good posture, using splints on painful joints, warm baths or applying warm compresses to stiff joints and cold compresses to painful joints may be helpful. Smoking and excessive drinking may worsen the symptoms of arthritis. Seek help from a physical therapist if the arthritis is becoming a problem with your daily activities. IMMUNIZATIONS  Several immunizations are important to have during your senior years, including:   Tetanus and a diptheria shot booster every 10 years.  Influenza every year before the flu season begins.  Pneumonia vaccine.  Shingles vaccine.  Others as indicated (example: H1N1 vaccine). Document Released: 09/11/2005 Document Revised: 10/12/2011 Document Reviewed: 05/07/2008 [facility] Patient Information 2013 [place], [place].

## 2012-08-10 NOTE — Progress Notes (Signed)
Kaitlyn Parks February 14, 1935 [id number]    History:    The patient presents for breast and pelvic exam. History of  LAVH with BSO with sling (Dr. [name]) and anterior repair for uterine prolapse with cystocele/rectocele.  History of normal Paps and mammograms. Colonoscopy normal approximately 10 years ago done at [facility]. Has had the Pneumovax vaccine approximately 10 years ago and does not think she had zostavac. DEXA, osteoporosis managed at primary care with Prolia. History of coronary artery disease diagnosed in 2011 and had angioplasty. Dr. Caprice Red cardiologist at [facility]. Hyperthyroid/hypercholesterolemia labs and meds Dr. Lovell Sheehan.    Past medical history, past surgical history, family history and social history were all reviewed and documented in the EPIC chart. Kaitlyn Parks, plays golf. 2 daughters 1 lives in [place] and one in [place], 3 grandchildren, all doing well.   Exam:  Filed Vitals:   08/10/12 1452  BP: 126/80    General appearance:  Normal Head/Neck:  Normal, without cervical or supraclavicular adenopathy. Thyroid:  Symmetrical, normal in size, without palpable masses or nodularity. Respiratory  Effort:  Normal  Auscultation:  Clear without wheezing or rhonchi Cardiovascular  Auscultation:  Regular rate, without rubs, murmurs or gallops  Edema/varicosities:  Not grossly evident Abdominal  Soft,nontender, without masses, guarding or rebound.  Liver/spleen:  No organomegaly noted  Hernia:  None appreciated  Skin  Inspection:  Grossly normal  Palpation:  Grossly normal Neurologic/psychiatric  Orientation:  Normal with appropriate conversation.  Mood/affect:  Normal  Genitourinary    Breasts: Examined lying and sitting.     Right: Without masses, retractions, discharge or axillary adenopathy.     Left: Without masses, retractions, discharge or axillary adenopathy.   Inguinal/mons:  Normal without inguinal adenopathy  External genitalia:   Normal  BUS/Urethra/Skene's glands:  Normal  Bladder:  Normal  Vagina:  Slightly atrophic/ good pelvic support  Cervix: Absent Uterus: Absent  Adnexa/parametria:     Rt: Without masses or tenderness.   Lt: Without masses or tenderness.  Anus and perineum: Normal  Digital rectal exam: Normal sphincter tone without palpated masses or tenderness  Assessment/Plan:  78 y.o. M. WF G2 P2 for breast and pelvic exam with no complaints.  Coronary artery disease-cardiologist Dr Caprice Red  Hypothyroid/hypercholesteremia/osteoporosis-primary care labs and meds TAH  BSO with sling and anterior repair/ 2009 with good relief of urinary stress incontinence symptoms  Plan: SBE's, continue annual mammogram, calcium rich diet, vitamin D 2000 daily encouraged. Home safety and fall prevention discussed. Will update vaccines as needed at primary care, repeat colonoscopy this year. Continue healthy lifestyle. Vaginal lubricants as needed.   Harrington Challenger [facility], 3:39 PM 08/10/2012

## 2012-08-15 ENCOUNTER — Ambulatory Visit: Payer: Medicare Other | Admitting: Internal Medicine

## 2012-08-22 ENCOUNTER — Ambulatory Visit (INDEPENDENT_AMBULATORY_CARE_PROVIDER_SITE_OTHER): Payer: Medicare Other | Admitting: Cardiology

## 2012-08-22 ENCOUNTER — Encounter: Payer: Self-pay | Admitting: Obstetrics and Gynecology

## 2012-08-22 ENCOUNTER — Encounter: Payer: Self-pay | Admitting: Cardiology

## 2012-08-22 VITALS — BP 128/70 | HR 73 | Ht 63.0 in

## 2012-08-22 DIAGNOSIS — I2510 Atherosclerotic heart disease of native coronary artery without angina pectoris: Secondary | ICD-10-CM

## 2012-08-22 DIAGNOSIS — I059 Rheumatic mitral valve disease, unspecified: Secondary | ICD-10-CM

## 2012-08-22 DIAGNOSIS — I10 Essential (primary) hypertension: Secondary | ICD-10-CM

## 2012-08-22 NOTE — Patient Instructions (Addendum)
The current medical regimen is effective;  continue present plan and medications.  Follow up in 1 year with Dr Hochrein.  You will receive a letter in the mail 2 months before you are due.  Please call us when you receive this letter to schedule your follow up appointment.  

## 2012-08-22 NOTE — Progress Notes (Signed)
HPI The patient presents for followup up of CAD.  Since last saw her she has had no cardiovascular symptoms. She traveled on a cruise and did quite a bit of walking. With this she denies any symptoms such as chest pressure, neck or arm discomfort. She has had no palpitations, presyncope or syncope. She has had no weight gain or edema. She seems to tolerate the lower dose of Crestor. She reduced the dose herself. She had some symptoms of cystitis on a higher dose.  Allergies  Allergen Reactions  . Pravastatin Sodium Other (See Comments)    cystitis  . Zocor (Simvastatin) Other (See Comments)    cystitis    Current Outpatient Prescriptions  Medication Sig Dispense Refill  . aspirin 81 MG tablet Take 81 mg by mouth daily.        . Calcium Carbonate-Vitamin D (CALCIUM + D PO) Take by mouth daily.      Marland Kitchen denosumab (PROLIA) 60 MG/ML SOLN injection Inject 60 mg into the skin every 6 (six) months. Administer in upper arm, thigh, or abdomen      . ESTRADIOL VA Place vaginally as needed.       . Multiple Vitamins-Calcium (ONE-A-DAY WOMENS PO) Take by mouth daily.      . Omega-3 Fatty Acids (OMEGA 3 PO) Take 2 capsules by mouth daily.       . rosuvastatin (CRESTOR) 10 MG tablet Take 10 mg by mouth daily.      Marland Kitchen SYNTHROID 50 MCG tablet TAKE 1 TABLET EACH DAY.  90 tablet  3    Past Medical History  Diagnosis Date  . CAD (coronary artery disease)     LAD stenting of a 90% lesion 2000 the left  . Cystocele   . Rectocele   . Urinary incontinence     USI  . Osteoporosis   . Uterine prolapse   . Elevated cholesterol   . Thyroid disease     Hypothyroid  . Scoliosis     Past Surgical History  Procedure Date  . Vaginal hysterectomy     LAVH BSO  . Oophorectomy     BSO  . Bladder suspension   . Coronary angioplasty with stent placement     ROS:  As stated in the HPI and negative for all other systems.  PHYSICAL EXAM BP 128/70  Pulse 73  Ht 5\' 3"  (1.6 m) GENERAL:  Well  appearing NECK:  No jugular venous distention, waveform within normal limits, carotid upstroke brisk and symmetric, no bruits, no thyromegaly LUNGS:  Clear to auscultation bilaterally BACK:  No CVA tenderness CHEST:  Unremarkable HEART:  PMI not displaced or sustained,S1 and S2 within normal limits, no S3, no S4, no clicks, no rubs, no murmurs ABD:  Flat, positive bowel sounds normal in frequency in pitch, no bruits, no rebound, no guarding, no midline pulsatile mass, no hepatomegaly, no splenomegaly EXT:  2 plus pulses throughout, no edema, no cyanosis no clubbing  EKG:  Sinus rhythm, rate 73, ALT of criteria for left ventricular hypertrophy, no acute ST-T wave changes.  08/22/2012  ASSESSMENT AND PLAN   CAD, NATIVE VESSEL -  The patient has had no new symptoms. She has had no symptoms since her last stress test. No further cardiovascular testing is suggested.  HYPERTENSION -  The blood pressure is at target. No change in medications is indicated. We will continue with therapeutic lifestyle changes (TLC).  Hyperlipidemia - She's tolerating the current low dose of Crestor with excellent  lipids. I think it is reasonable that she stay on this dose. No change in therapy is indicated.

## 2012-08-30 ENCOUNTER — Ambulatory Visit (INDEPENDENT_AMBULATORY_CARE_PROVIDER_SITE_OTHER): Payer: Medicare Other | Admitting: *Deleted

## 2012-08-30 DIAGNOSIS — M810 Age-related osteoporosis without current pathological fracture: Secondary | ICD-10-CM

## 2012-08-30 MED ORDER — DENOSUMAB 60 MG/ML ~~LOC~~ SOLN
60.0000 mg | Freq: Once | SUBCUTANEOUS | Status: AC
  Administered 2012-08-30: 60 mg via SUBCUTANEOUS

## 2012-12-27 ENCOUNTER — Other Ambulatory Visit: Payer: Medicare Other

## 2012-12-28 ENCOUNTER — Other Ambulatory Visit (INDEPENDENT_AMBULATORY_CARE_PROVIDER_SITE_OTHER): Payer: Medicare Other

## 2012-12-28 DIAGNOSIS — E785 Hyperlipidemia, unspecified: Secondary | ICD-10-CM

## 2012-12-28 DIAGNOSIS — Z0000 Encounter for general adult medical examination without abnormal findings: Secondary | ICD-10-CM

## 2012-12-28 LAB — POCT URINALYSIS DIPSTICK
Bilirubin, UA: NEGATIVE
Glucose, UA: NEGATIVE
Ketones, UA: NEGATIVE
Spec Grav, UA: 1.015

## 2012-12-28 LAB — BASIC METABOLIC PANEL
BUN: 15 mg/dL (ref 6–23)
CO2: 29 mEq/L (ref 19–32)
Chloride: 107 mEq/L (ref 96–112)
Glucose, Bld: 90 mg/dL (ref 70–99)
Potassium: 3.8 mEq/L (ref 3.5–5.1)

## 2012-12-28 LAB — LIPID PANEL
Cholesterol: 151 mg/dL (ref 0–200)
HDL: 63.9 mg/dL (ref >39.00)
LDL Cholesterol: 76 mg/dL (ref 0–99)
Triglycerides: 57 mg/dL (ref 0.0–149.0)
VLDL: 11.4 mg/dL (ref 0.0–40.0)

## 2012-12-28 LAB — CBC WITH DIFFERENTIAL/PLATELET
Basophils Absolute: 0 10*3/uL (ref 0.0–0.1)
Eosinophils Absolute: 0.1 10*3/uL (ref 0.0–0.7)
Lymphocytes Relative: 21.9 % (ref 12.0–46.0)
MCHC: 33.7 g/dL (ref 30.0–36.0)
Neutrophils Relative %: 65.6 % (ref 43.0–77.0)
RBC: 4.45 Mil/uL (ref 3.87–5.11)
RDW: 13 % (ref 11.5–14.6)

## 2012-12-28 LAB — HEPATIC FUNCTION PANEL
Alkaline Phosphatase: 40 U/L (ref 39–117)
Bilirubin, Direct: 0 mg/dL (ref 0.0–0.3)

## 2013-01-02 ENCOUNTER — Ambulatory Visit (INDEPENDENT_AMBULATORY_CARE_PROVIDER_SITE_OTHER): Payer: Medicare Other | Admitting: Internal Medicine

## 2013-01-02 ENCOUNTER — Encounter: Payer: Self-pay | Admitting: Internal Medicine

## 2013-01-02 VITALS — BP 120/78 | HR 72 | Temp 98.1°F | Resp 16 | Ht 63.0 in | Wt 130.0 lb

## 2013-01-02 DIAGNOSIS — E785 Hyperlipidemia, unspecified: Secondary | ICD-10-CM

## 2013-01-02 DIAGNOSIS — Z0000 Encounter for general adult medical examination without abnormal findings: Secondary | ICD-10-CM

## 2013-01-02 MED ORDER — ROSUVASTATIN CALCIUM 20 MG PO TABS: 20.0000 mg | ORAL_TABLET | ORAL | Status: DC

## 2013-01-02 NOTE — Progress Notes (Signed)
Subjective:    Patient ID: Kaitlyn Parks, female    DOB: April 21, 1935, 78 y.o.   MRN: [id number]  HPI  Reviewed the note from Cardiology and she has been stable on lipids, HTN and CAD. Changed to crestor with good results but she notes that the statins may cause renal/ baldder irritation.  Review of Systems  Constitutional: Negative for activity change, appetite change and fatigue.  HENT: Negative for ear pain, congestion, neck pain, postnasal drip and sinus pressure.   Eyes: Negative for redness and visual disturbance.  Respiratory: Negative for cough, shortness of breath and wheezing.   Gastrointestinal: Negative for abdominal pain and abdominal distention.  Genitourinary: Negative for dysuria, frequency and menstrual problem.  Musculoskeletal: Negative for myalgias, joint swelling and arthralgias.  Skin: Negative for rash and wound.  Neurological: Negative for dizziness, weakness and headaches.  Hematological: Negative for adenopathy. Does not bruise/bleed easily.  Psychiatric/Behavioral: Negative for sleep disturbance and decreased concentration.   Past Medical History  Diagnosis Date  . CAD (coronary artery disease)     LAD stenting of a 90% lesion 2000 the left  . Cystocele   . Rectocele   . Urinary incontinence     USI  . Osteoporosis   . Uterine prolapse   . Elevated cholesterol   . Thyroid disease     Hypothyroid  . Scoliosis     History   Social History  . Marital Status: Married    Spouse Name: N/A    Number of Children: N/A  . Years of Education: N/A   Occupational History  . Not on file.   Social History Main Topics  . Smoking status: Never Smoker   . Smokeless tobacco: Never Used  . Alcohol Use: 0.0 oz/week     Comment: Rare  . Drug Use: Not on file  . Sexually Active: No   Other Topics Concern  . Not on file   Social History Narrative  . No narrative on file    Past Surgical History  Procedure Laterality Date  . Vaginal hysterectomy      LAVH BSO  . Oophorectomy      BSO  . Bladder suspension    . Coronary angioplasty with stent placement      Family History  Problem Relation Age of Onset  . Hypertension Mother   . Heart disease Mother   . Heart disease Father   . Heart disease Sister   . Colon cancer Paternal Aunt   . Breast cancer Paternal Aunt     Age 33's  . Diabetes Sister   . Breast cancer Cousin     Maternal 1st cousins-Age 56's  . Leukemia Paternal Aunt     Allergies  Allergen Reactions  . Pravastatin Sodium Other (See Comments)    cystitis  . Zocor (Simvastatin) Other (See Comments)    cystitis    Current Outpatient Prescriptions on File Prior to Visit  Medication Sig Dispense Refill  . aspirin 81 MG tablet Take 81 mg by mouth daily.        Marland Kitchen denosumab (PROLIA) 60 MG/ML SOLN injection Inject 60 mg into the skin every 6 (six) months. Administer in upper arm, thigh, or abdomen      . ESTRADIOL VA Place vaginally as needed.       . Omega-3 Fatty Acids (OMEGA 3 PO) Take 2 capsules by mouth daily.       Marland Kitchen SYNTHROID 50 MCG tablet TAKE 1 TABLET EACH DAY.  90 tablet  3  . Calcium Carbonate-Vitamin D (CALCIUM + D PO) Take by mouth daily.      . Multiple Vitamins-Calcium (ONE-A-DAY WOMENS PO) Take by mouth daily.       No current facility-administered medications on file prior to visit.    BP 120/78  Pulse 72  Temp(Src) 98.1 F (36.7 C)  Resp 16  Ht 5\' 3"  (1.6 m)  Wt 130 lb (58.968 kg)  BMI 23.03 kg/m2       Objective:   Physical Exam  Constitutional: She is oriented to person, place, and time. She appears well-developed and well-nourished. No distress.  HENT:  Head: Normocephalic and atraumatic.  Right Ear: External ear normal.  Left Ear: External ear normal.  Nose: Nose normal.  Mouth/Throat: Oropharynx is clear and moist.  Eyes: Conjunctivae and EOM are normal. Pupils are equal, round, and reactive to light.  Neck: Normal range of motion. Neck supple. No JVD present. No tracheal  deviation present. No thyromegaly present.  Cardiovascular: Normal rate, regular rhythm and intact distal pulses.   Murmur heard. Pulmonary/Chest: Effort normal and breath sounds normal. She has no wheezes. She exhibits no tenderness.  Abdominal: Soft. Bowel sounds are normal.  Musculoskeletal: Normal range of motion. She exhibits no edema and no tenderness.  Lymphadenopathy:    She has no cervical adenopathy.  Neurological: She is alert and oriented to person, place, and time. She has normal reflexes. No cranial nerve deficit.  Skin: Skin is warm and dry. She is not diaphoretic.  Psychiatric: She has a normal mood and affect. Her behavior is normal.          Assessment & Plan:  Discussion of pulse crestor due to the renal/ urinary side effects ( she admits that she skips doses due to the side effects) 20 mg Monday and Friday   This is a routine physical examination for this healthy  Female. Reviewed all health maintenance protocols including mammography colonoscopy bone density and reviewed appropriate screening labs. Her immunization history was reviewed as well as her current medications and allergies refills of her chronic medications were given and the plan for yearly health maintenance was discussed all orders and referrals were made as appropriate.

## 2013-01-02 NOTE — Patient Instructions (Addendum)
Change the crestor to twice a week to limit side effects

## 2013-01-24 ENCOUNTER — Emergency Department (HOSPITAL_COMMUNITY): Payer: Medicare Other

## 2013-01-24 ENCOUNTER — Telehealth: Payer: Self-pay | Admitting: Cardiology

## 2013-01-24 ENCOUNTER — Encounter (HOSPITAL_COMMUNITY): Payer: Self-pay | Admitting: *Deleted

## 2013-01-24 ENCOUNTER — Emergency Department (HOSPITAL_COMMUNITY)
Admission: EM | Admit: 2013-01-24 | Discharge: 2013-01-24 | Disposition: A | Discharge status: Home / Self Care | Payer: Medicare Other | Attending: Emergency Medicine | Admitting: Emergency Medicine

## 2013-01-24 DIAGNOSIS — Z90710 Acquired absence of both cervix and uterus: Secondary | ICD-10-CM | POA: Insufficient documentation

## 2013-01-24 DIAGNOSIS — R209 Unspecified disturbances of skin sensation: Secondary | ICD-10-CM | POA: Insufficient documentation

## 2013-01-24 DIAGNOSIS — M549 Dorsalgia, unspecified: Secondary | ICD-10-CM

## 2013-01-24 DIAGNOSIS — I2510 Atherosclerotic heart disease of native coronary artery without angina pectoris: Secondary | ICD-10-CM | POA: Insufficient documentation

## 2013-01-24 DIAGNOSIS — Z9861 Coronary angioplasty status: Secondary | ICD-10-CM | POA: Insufficient documentation

## 2013-01-24 DIAGNOSIS — Z8742 Personal history of other diseases of the female genital tract: Secondary | ICD-10-CM | POA: Insufficient documentation

## 2013-01-24 DIAGNOSIS — Y929 Unspecified place or not applicable: Secondary | ICD-10-CM | POA: Insufficient documentation

## 2013-01-24 DIAGNOSIS — X58XXXA Exposure to other specified factors, initial encounter: Secondary | ICD-10-CM | POA: Insufficient documentation

## 2013-01-24 DIAGNOSIS — S239XXA Sprain of unspecified parts of thorax, initial encounter: Secondary | ICD-10-CM | POA: Insufficient documentation

## 2013-01-24 DIAGNOSIS — Y9301 Activity, walking, marching and hiking: Secondary | ICD-10-CM | POA: Insufficient documentation

## 2013-01-24 DIAGNOSIS — E039 Hypothyroidism, unspecified: Secondary | ICD-10-CM | POA: Insufficient documentation

## 2013-01-24 DIAGNOSIS — Z79899 Other long term (current) drug therapy: Secondary | ICD-10-CM | POA: Insufficient documentation

## 2013-01-24 DIAGNOSIS — S39012A Strain of muscle, fascia and tendon of lower back, initial encounter: Secondary | ICD-10-CM

## 2013-01-24 DIAGNOSIS — Z8739 Personal history of other diseases of the musculoskeletal system and connective tissue: Secondary | ICD-10-CM | POA: Insufficient documentation

## 2013-01-24 DIAGNOSIS — Z7982 Long term (current) use of aspirin: Secondary | ICD-10-CM | POA: Insufficient documentation

## 2013-01-24 LAB — BASIC METABOLIC PANEL
Chloride: 102 mEq/L (ref 96–112)
GFR calc Af Amer: >90 mL/min (ref >90)
GFR calc non Af Amer: 83 mL/min — ABNORMAL LOW (ref >90)
Glucose, Bld: 83 mg/dL (ref 70–99)
Potassium: 4.2 mEq/L (ref 3.5–5.1)
Sodium: 140 mEq/L (ref 135–145)

## 2013-01-24 LAB — CBC
HCT: 40.6 % (ref 36.0–46.0)
Hemoglobin: 13.6 g/dL (ref 12.0–15.0)
MCHC: 33.5 g/dL (ref 30.0–36.0)
WBC: 3.9 10*3/uL — ABNORMAL LOW (ref 4.0–10.5)

## 2013-01-24 LAB — TROPONIN I: Troponin I: <0.3 ng/mL (ref <0.30)

## 2013-01-24 LAB — URINALYSIS, DIPSTICK ONLY
Hgb urine dipstick: NEGATIVE
Nitrite: NEGATIVE
Specific Gravity, Urine: 1.013 (ref 1.005–1.030)
Urobilinogen, UA: 0.2 mg/dL (ref 0.0–1.0)
pH: 7.5 (ref 5.0–8.0)

## 2013-01-24 LAB — POCT I-STAT TROPONIN I

## 2013-01-24 MED ORDER — KETOROLAC TROMETHAMINE 30 MG/ML IJ SOLN
15.0000 mg | Freq: Once | INTRAMUSCULAR | Status: AC
  Administered 2013-01-24: 15 mg via INTRAMUSCULAR
  Filled 2013-01-24: qty 1

## 2013-01-24 NOTE — ED Notes (Signed)
Patient transported to X-ray 

## 2013-01-24 NOTE — ED Provider Notes (Signed)
History    CSN: [id number] Arrival date & time 01/24/13  7829  First MD Initiated Contact with Patient 01/24/13 1055     Chief Complaint  Patient presents with  . Arm Pain  . Back Pain  . Chest Pain   (Consider location/radiation/quality/duration/timing/severity/associated sxs/prior Treatment) Patient is a 78 y.o. female presenting with back pain.  Back Pain Location:  Thoracic spine Quality:  Aching and shooting Radiates to: L arm. Pain severity:  Severe Pain is:  Worse during the night Onset quality:  Unable to specify Duration:  4 hours Timing:  Constant Progression:  Waxing and waning Chronicity:  New Context: not emotional stress, not falling, not MVA, not physical stress, not recent illness, not recent injury and not twisting   Relieved by:  Nothing Worsened by:  Nothing tried Associated symptoms: tingling (L arm)   Associated symptoms: no abdominal pain, no bladder incontinence, no bowel incontinence, no chest pain, no dysuria, no fever, no numbness, no paresthesias, no pelvic pain and no perianal numbness   Risk factors comment:  CAD  Past Medical History  Diagnosis Date  . CAD (coronary artery disease)     LAD stenting of a 90% lesion 2000 the left  . Cystocele   . Rectocele   . Urinary incontinence     USI  . Osteoporosis   . Uterine prolapse   . Elevated cholesterol   . Thyroid disease     Hypothyroid  . Scoliosis    Past Surgical History  Procedure Laterality Date  . Vaginal hysterectomy      LAVH BSO  . Oophorectomy      BSO  . Bladder suspension    . Coronary angioplasty with stent placement     Family History  Problem Relation Age of Onset  . Hypertension Mother   . Heart disease Mother   . Heart disease Father   . Heart disease Sister   . Colon cancer Paternal Aunt   . Breast cancer Paternal Aunt     Age 38's  . Diabetes Sister   . Breast cancer Cousin     Maternal 1st cousins-Age 94's  . Leukemia Paternal Aunt    History    Substance Use Topics  . Smoking status: Never Smoker   . Smokeless tobacco: Never Used  . Alcohol Use: 0.0 oz/week     Comment: Rare   OB History   Grav Para Term Preterm Abortions TAB SAB Ect Mult Living   2 2 2       2      Review of Systems  Constitutional: Negative for fever and chills.  HENT: Negative for congestion, sore throat and rhinorrhea.   Eyes: Negative for photophobia and visual disturbance.  Respiratory: Negative for cough and shortness of breath.   Cardiovascular: Negative for chest pain and leg swelling.  Gastrointestinal: Negative for nausea, vomiting, abdominal pain, diarrhea, constipation and bowel incontinence.  Endocrine: Negative for polyphagia and polyuria.  Genitourinary: Negative for bladder incontinence, dysuria, flank pain, vaginal bleeding, vaginal discharge, enuresis and pelvic pain.  Musculoskeletal: Positive for back pain. Negative for gait problem.  Skin: Negative for color change and rash.  Neurological: Positive for tingling (L arm). Negative for dizziness, syncope, light-headedness, numbness and paresthesias.  Hematological: Negative for adenopathy. Does not bruise/bleed easily.  All other systems reviewed and are negative.    Allergies  Pravastatin sodium and Zocor  Home Medications   Current Outpatient Rx  Name  Route  Sig  Dispense  Refill  .  aspirin 81 MG tablet   Oral   Take 81 mg by mouth daily.           Marland Kitchen aspirin EC 325 MG tablet   Oral   Take 650 mg by mouth once.         . Calcium Carbonate-Vitamin D (CALCIUM + D PO)   Oral   Take by mouth daily.         Marland Kitchen denosumab (PROLIA) 60 MG/ML SOLN injection   Subcutaneous   Inject 60 mg into the skin every 6 (six) months. Administer in upper arm, thigh, or abdomen         . levothyroxine (SYNTHROID, LEVOTHROID) 50 MCG tablet   Oral   Take 50 mcg by mouth daily before breakfast.         . Multiple Vitamins-Calcium (ONE-A-DAY WOMENS PO)   Oral   Take 1 tablet by  mouth daily.          . Omega-3 Fatty Acids (OMEGA 3 PO)   Oral   Take 2 capsules by mouth daily.          . rosuvastatin (CRESTOR) 20 MG tablet   Oral   Take 20 mg by mouth every other day.          BP 147/67  Pulse 56  Temp(Src) 98.6 F (37 C) (Oral)  Resp 13  Ht 5\' 4"  (1.626 m)  Wt 130 lb (58.968 kg)  BMI 22.3 kg/m2  SpO2 98% Physical Exam  Vitals reviewed. Constitutional: She is oriented to person, place, and time. She appears well-developed and well-nourished.  HENT:  Head: Normocephalic and atraumatic.  Right Ear: External ear normal.  Left Ear: External ear normal.  Eyes: Conjunctivae and EOM are normal. Pupils are equal, round, and reactive to light.  Neck: Normal range of motion. Neck supple.  Cardiovascular: Normal rate, regular rhythm, normal heart sounds and intact distal pulses.   Pulmonary/Chest: Effort normal and breath sounds normal.  Abdominal: Soft. Bowel sounds are normal. There is no tenderness.  Musculoskeletal: Normal range of motion.       Cervical back: Normal.       Thoracic back: She exhibits tenderness and pain. She exhibits no bony tenderness, no edema, no deformity and no spasm.       Lumbar back: Normal.       Back:  Neurological: She is alert and oriented to person, place, and time.  Skin: Skin is warm and dry.    ED Course  Procedures (including critical care time) Labs Reviewed  CBC - Abnormal; Notable for the following:    WBC 3.9 (*)    All other components within normal limits  BASIC METABOLIC PANEL - Abnormal; Notable for the following:    GFR calc non Af Amer 83 (*)    All other components within normal limits  URINALYSIS, DIPSTICK ONLY - Abnormal; Notable for the following:    Leukocytes, UA TRACE (*)    All other components within normal limits  TROPONIN I  POCT I-STAT TROPONIN I   Dg Chest 2 View  01/24/2013   *RADIOLOGY REPORT*  Clinical Data: Arm pain.  Back pain.  Chest pain.  CHEST - 2 VIEW  Comparison:  09/20/2011.  Findings: Emphysema.  Bilateral pleural apical scarring is chronic and appears unchanged compared to prior exam.  Chronic bronchitic change.  The there is no airspace disease.  No pleural effusion. Thoracic spondylosis.  Mild S-shaped thoracolumbar scoliosis.  IMPRESSION: Emphysema without acute  cardiopulmonary disease.   Original Report Authenticated By: Andreas Newport, M.D.   Dg Thoracic Spine 2 View  01/24/2013   *RADIOLOGY REPORT*  Clinical Data: Upper back and left arm pain.  THORACIC SPINE - 2 VIEW  Comparison: Two-view chest x-ray 09/20/2011.  Findings: 12 rib-bearing thoracic type vertebral bodies are present.  The vertebral body heights and alignment are maintained. Chronic endplate sclerotic changes are most evident in the mid thoracic spine, particularly at T9-10 leftward curvature of the thoracic spine is present at L3-4.  There is rightward curvature centered at L1.  IMPRESSION:  1.  Scoliosis and chronic end plate changes. 2.  No acute abnormality.   Original Report Authenticated By: Marin Roberts, M.D.   Dg Lumbar Spine 2-3 Views  01/24/2013   *RADIOLOGY REPORT*  Clinical Data: Back pain.  LUMBAR SPINE - 2-3 VIEW  Comparison: Lumbar spine radiographs 08/30/2009.  Findings: Right curvature of lumbar spine is centered at L1-2. Five non-rib bearing lumbar type to bodies are present.  Leftward curvature is evident at L3-4 with asymmetric right-sided sclerotic changes at L3-4.  There is chronic loss of height at L5-S1. Moderate facet hypertrophy is present in the lower lumbar spine. The AP alignment is anatomic.  Vertebral body heights are maintained.  IMPRESSION: Scoliosis and advanced degenerative changes, particularly on the right at L3-4 and L4-5.  No acute abnormality.  No significant compression fracture.   Original Report Authenticated By: Marin Roberts, M.D.   1. Back pain   2. Back strain, initial encounter      Date: 01/24/2013  Rate: 59  Rhythm: sinus  bradycardia  QRS Axis: right  Intervals: normal  ST/T Wave abnormalities: normal  Conduction Disutrbances:none  Narrative Interpretation: Sinus bradycardia  Old EKG Reviewed: unchanged    MDM  78 y.o. female  with pertinent PMH of CAD presents with paraspinal back pain x 2 days.  Pain usually awakens her from sleep around 3-4am, however is not associated with nausea, chest pain, dyspnea, or any other symptoms.  She specifically states it is not like prior pain from CAD which prompted stenting.  Physical exam as above with some paraspinal tenderness.  XR as above with emphysema, no acute fracture of spine.  Pt in mild pain during time of exam which fits with prior history.  Delta troponin and labwork negative.  Doubt ACS given history, however do think that it is important that pt fu with cardiology, discussed this with her and she agreed to fu, voiced understanding of precautions for any worsening pain, dyspnea, or change in symptoms.  Also doubt Aortic aneurysm, disection, PNA, PTX, PE, Sepsis, or other emergent pathology.  During dc instruction pt stated pain recurred, and on my exam pt now has point tenderness and reproducibility of pain, which is reassuring for acute pathology.  Given toradol for her symptoms.    Labs and imaging as above reviewed by myself and attending,Dr. Patria Mane, with whom case was discussed.   1. Back pain   2. Back strain, initial encounter            Noel Gerold, MD 01/24/13 1410

## 2013-01-24 NOTE — ED Notes (Signed)
C/o intermittent episodes sharp left scapula pain since Sunday. States pain radiates into left shoulder & arm, also has tingling & numbness in lower arm & fingers. States has daily episodes which she is awoken from sleep with pain but then pain gradually goes away. States awoken from sleep today at 0330 but pain continues. Denies CP, n/v, SOB, diaphoresis. States does water exercises 2x week, went Monday after pain subsided.  Pain worsens with mvmt, area tender to palpation.

## 2013-01-24 NOTE — ED Notes (Signed)
To ED for eval after waking this am with left upper back pain, high blood pressure, and left arm pain for the past 2 mornings. Took 2 325mg  ASA. Pain remains in arm. No CP now. Hx of stents. States she also has mid upper back pain that radiates through to chest, but without cp. Appears in nad

## 2013-01-24 NOTE — Telephone Encounter (Signed)
New problem  Pt states she had a stent placed. She said she is in severe pain that starts from her back and goes to chest. She said it is also going down her left arm into her fingers. She said it started at 3 am. She said it started Sunday but it is getting worse, she took an asiprin and it got a little better. She said she had some pain yesterday morning but it went away a little until this morning. She the pain is about an 8.

## 2013-01-24 NOTE — Telephone Encounter (Signed)
Pt states has been having episodes of chest pain for the last 3 days. The pain starts  as sharp in the  Back, then travels to the left front chest to the left arm down to the fingers and tingles . The pain today started at 3:00 AM the pain score then was a " 10" pt took 2 Aspirin 325 mg the pain score is a "7" . Pt's BP is 183/88. Pt has a history of stent placement in the left anterior descending in 2012,  and she was told that she has a blockage in another artery.  Dr. Jens Som for the [facility] recommends for pt that, if she thinks is her heart, she needs to go to the ER. Pt verbalized understanding. Daun Peacock  aware that   pt is going to the ER.

## 2013-01-26 ENCOUNTER — Telehealth: Payer: Self-pay | Admitting: Internal Medicine

## 2013-01-26 ENCOUNTER — Ambulatory Visit (INDEPENDENT_AMBULATORY_CARE_PROVIDER_SITE_OTHER): Payer: Medicare Other | Admitting: Family Medicine

## 2013-01-26 ENCOUNTER — Encounter: Payer: Self-pay | Admitting: Family Medicine

## 2013-01-26 VITALS — BP 122/78 | HR 70 | Temp 98.0°F | Wt 130.0 lb

## 2013-01-26 DIAGNOSIS — M546 Pain in thoracic spine: Secondary | ICD-10-CM

## 2013-01-26 MED ORDER — HYDROCODONE-ACETAMINOPHEN 5-325 MG PO TABS: 1.0000 | ORAL_TABLET | Freq: Four times a day (QID) | ORAL | Status: DC | PRN

## 2013-01-26 NOTE — ED Provider Notes (Signed)
I saw and evaluated the patient, reviewed the resident's note and I agree with the findings and plan. I personally evaluated the ECG and agree with the interpretation of the resident  This is likely musculoskeletal back pain.  Doubt ACS.  Troponin x2 is negative.  EKG without significant changes  Lyanne Co, MD 01/26/13 2359

## 2013-01-26 NOTE — Progress Notes (Signed)
  Subjective:    Patient ID: Kaitlyn Parks, female    DOB: Sep 26, 1934, 78 y.o.   MRN: [id number]  HPI Here for upper back pain that started on the morning of 01-24-13. She woke up early that morning in bed with severe sharp pain in the left upper back that radiated down the left arm. She found that the pain is worse when lying down and is much better when sitting up or standing. No chest pain or SOB. She has been taking Aleve which helps somewhat. She has some intermittent tingling in the left arm and hand but no numbness or weakness. She has a hx of scoliosis and has chronic lower back pain. She is usually quite active, she works out in her [employer], takes [employer], and plays golf. She wen to the ED on 01-24-13 and they ruled out any cardiac etiologies. She had Xrays of the thoracic and lumbar spines showing scoliosis and diffuse degenerative changes, but no vertebral fractures. She was given a Toradol shot but nothing to take by mouth.    Review of Systems  Constitutional: Negative.   Respiratory: Negative.   Cardiovascular: Negative.   Musculoskeletal: Positive for back pain.       Objective:   Physical Exam  Constitutional: She appears well-developed and well-nourished.  In mild pain   Cardiovascular: Normal rate, regular rhythm, normal heart sounds and intact distal pulses.   Pulmonary/Chest: Effort normal and breath sounds normal.  Musculoskeletal:  She is tender over the thoracic spine and especially over the left upper back between the spine and the scapula. The left shoulder is intact with full ROM.           Assessment & Plan:  Thoracic spine pain which is consistent with pinched nerves, probably from either bone spurs or a herniated disc. Add Vicodin to the Aleve for pain control. Refer to PT. Use moist heat. Recheck prn

## 2013-01-26 NOTE — Telephone Encounter (Signed)
Patient Information:  Caller Name: Kaitlyn Parks  Phone: [phone]  Patient: Kaitlyn Parks, Kaitlyn Parks  Gender: Female  DOB: [birth year]  Age: 78 Years  PCP: Darryll Capers (Adults only)  Office Follow Up:  Does the office need to follow up with this patient?: No  Instructions For The Office: N/A  RN Note:  pt rates her pain as mild now but at 0330 her pain is severe  Symptoms  Reason For Call & Symptoms: pt began having severe pain in her back and radiating down her arm.  Pt was seen in the ER to rule out cardiac issues.  Pt states she is not sleeping  Reviewed Health History In EMR: Yes  Reviewed Medications In EMR: Yes  Reviewed Allergies In EMR: Yes  Reviewed Surgeries / Procedures: Yes  Date of Onset of Symptoms: 01/22/2013  Treatments Tried: Aleve  Treatments Tried Worked: No  Guideline(s) Used:  Back Pain  Disposition Per Guideline:   See Today or Tomorrow in Office  Reason For Disposition Reached:   Patient wants to be seen  Advice Given:  N/A  Patient Will Follow Care Advice:  YES  Appointment Scheduled:  01/26/2013 11:00:00 Appointment Scheduled Provider:  Gershon Crane (Family Practice)  (hospital follow up - 30 minutes appt)

## 2013-01-31 ENCOUNTER — Ambulatory Visit: Payer: Medicare Other | Attending: Family Medicine

## 2013-01-31 DIAGNOSIS — IMO0001 Reserved for inherently not codable concepts without codable children: Secondary | ICD-10-CM | POA: Insufficient documentation

## 2013-01-31 DIAGNOSIS — M546 Pain in thoracic spine: Secondary | ICD-10-CM | POA: Insufficient documentation

## 2013-01-31 DIAGNOSIS — R5381 Other malaise: Secondary | ICD-10-CM | POA: Insufficient documentation

## 2013-02-07 ENCOUNTER — Ambulatory Visit: Payer: Medicare Other | Admitting: Physical Therapy

## 2013-02-09 ENCOUNTER — Ambulatory Visit: Payer: Medicare Other | Admitting: Physical Therapy

## 2013-02-14 ENCOUNTER — Encounter: Payer: Medicare Other | Admitting: Physical Therapy

## 2013-02-16 ENCOUNTER — Encounter: Payer: Medicare Other | Admitting: Physical Therapy

## 2013-02-21 ENCOUNTER — Ambulatory Visit: Payer: Medicare Other | Admitting: Physical Therapy

## 2013-02-28 ENCOUNTER — Ambulatory Visit (INDEPENDENT_AMBULATORY_CARE_PROVIDER_SITE_OTHER): Payer: Medicare Other | Admitting: *Deleted

## 2013-02-28 ENCOUNTER — Ambulatory Visit: Payer: Medicare Other | Admitting: Physical Therapy

## 2013-02-28 DIAGNOSIS — M810 Age-related osteoporosis without current pathological fracture: Secondary | ICD-10-CM

## 2013-02-28 MED ORDER — DENOSUMAB 60 MG/ML ~~LOC~~ SOLN
60.0000 mg | Freq: Once | SUBCUTANEOUS | Status: AC
  Administered 2013-02-28: 60 mg via SUBCUTANEOUS

## 2013-03-02 ENCOUNTER — Encounter: Payer: Medicare Other | Admitting: Physical Therapy

## 2013-03-09 ENCOUNTER — Ambulatory Visit: Payer: Medicare Other

## 2013-04-24 ENCOUNTER — Ambulatory Visit (INDEPENDENT_AMBULATORY_CARE_PROVIDER_SITE_OTHER): Payer: Medicare Other

## 2013-04-24 DIAGNOSIS — Z23 Encounter for immunization: Secondary | ICD-10-CM

## 2013-04-26 ENCOUNTER — Other Ambulatory Visit: Payer: Self-pay | Admitting: *Deleted

## 2013-06-06 ENCOUNTER — Other Ambulatory Visit: Payer: Self-pay | Admitting: Internal Medicine

## 2013-07-05 ENCOUNTER — Ambulatory Visit: Payer: Medicare Other | Admitting: Internal Medicine

## 2013-08-03 HISTORY — PX: CATARACT EXTRACTION: SUR2

## 2013-08-04 ENCOUNTER — Telehealth: Payer: Self-pay | Admitting: Cardiology

## 2013-08-04 NOTE — Telephone Encounter (Signed)
New message     Cardiac rehab faxed form for orders for pt to participate in maintenance program---they have not received it.

## 2013-08-04 NOTE — Telephone Encounter (Signed)
Pt was told that I will forward note to Dr/Nurse and they will address it next week, pt agreed to plan.

## 2013-08-07 NOTE — Telephone Encounter (Signed)
Resend rehab orders.

## 2013-08-10 ENCOUNTER — Encounter: Payer: Self-pay | Admitting: Cardiology

## 2013-08-10 ENCOUNTER — Ambulatory Visit (INDEPENDENT_AMBULATORY_CARE_PROVIDER_SITE_OTHER): Payer: Medicare Other | Admitting: Cardiology

## 2013-08-10 VITALS — BP 120/80 | HR 62 | Ht 64.0 in | Wt 133.0 lb

## 2013-08-10 DIAGNOSIS — I2510 Atherosclerotic heart disease of native coronary artery without angina pectoris: Secondary | ICD-10-CM

## 2013-08-10 DIAGNOSIS — E7800 Pure hypercholesterolemia, unspecified: Secondary | ICD-10-CM

## 2013-08-10 DIAGNOSIS — R9431 Abnormal electrocardiogram [ECG] [EKG]: Secondary | ICD-10-CM

## 2013-08-10 NOTE — Patient Instructions (Signed)
The current medical regimen is effective;  continue present plan and medications.  Please return for fasting lipid profile.  Follow up in 1 year with Dr Antoine PocheHochrein.  You will receive a letter in the mail 2 months before you are due.  Please call us when you receive this letter to schedule your follow up appointment.

## 2013-08-10 NOTE — Progress Notes (Signed)
HPI The patient presents for followup up of CAD.  Since last saw her she has had no cardiovascular symptoms. She traveled on a cruise and did quite a bit of walking. With this she denies any symptoms such as chest pressure, neck or arm discomfort. She has had no palpitations, presyncope or syncope. She has had no weight gain or edema. She has been under some stress caring for her husband who has had a stroke. He also saw me in the office today. She wants to participate in cardiac rehabilitation with him. He had a stent recently as well.  Allergies  Allergen Reactions  . Pravastatin Sodium Other (See Comments)    cystitis  . Zocor [Simvastatin] Other (See Comments)    cystitis    Current Outpatient Prescriptions  Medication Sig Dispense Refill  . aspirin 81 MG tablet Take 81 mg by mouth daily.        . Calcium Carbonate-Vitamin D (CALCIUM + D PO) Take by mouth daily.      Marland Kitchen. denosumab (PROLIA) 60 MG/ML SOLN injection Inject 60 mg into the skin every 6 (six) months. Administer in upper arm, thigh, or abdomen      . HYDROcodone-acetaminophen (NORCO/VICODIN) 5-325 MG per tablet Take 1 tablet by mouth every 6 (six) hours as needed for pain.  60 tablet  0  . Multiple Vitamins-Calcium (ONE-A-DAY WOMENS PO) Take 1 tablet by mouth daily.       . Omega-3 Fatty Acids (OMEGA 3 PO) Take 2 capsules by mouth daily.       . rosuvastatin (CRESTOR) 20 MG tablet Take 20 mg by mouth every other day. 5 mg daily currently      . SYNTHROID 50 MCG tablet TAKE 1 TABLET EACH DAY.  90 tablet  3   No current facility-administered medications for this visit.    Past Medical History  Diagnosis Date  . CAD (coronary artery disease)     LAD stenting of a 90% lesion 2000 the left  . Cystocele   . Rectocele   . Urinary incontinence     USI  . Osteoporosis   . Uterine prolapse   . Elevated cholesterol   . Thyroid disease     Hypothyroid  . Scoliosis     Past Surgical History  Procedure Laterality Date  .  Vaginal hysterectomy      LAVH BSO  . Oophorectomy      BSO  . Bladder suspension    . Coronary angioplasty with stent placement      ROS:  As stated in the HPI and negative for all other systems.  PHYSICAL EXAM BP 120/80  Pulse 62  Ht 5\' 4"  (1.626 m)  Wt 133 lb (60.328 kg)  BMI 22.82 kg/m2 GENERAL:  Well appearing NECK:  No jugular venous distention, waveform within normal limits, carotid upstroke brisk and symmetric, no bruits, no thyromegaly LUNGS:  Clear to auscultation bilaterally BACK:  No CVA tenderness CHEST:  Unremarkable HEART:  PMI not displaced or sustained,S1 and S2 within normal limits, no S3, no S4, no clicks, no rubs, soft apical early systolic murmur nonradiating, no diastolicmurmurs ABD:  Flat, positive bowel sounds normal in frequency in pitch, no bruits, no rebound, no guarding, no midline pulsatile mass, no hepatomegaly, no splenomegaly EXT:  2 plus pulses throughout, no edema, no cyanosis no clubbing  EKG:  Sinus rhythm, rate 62, no acute ST-T wave changes.  08/10/2013  ASSESSMENT AND PLAN   CAD, NATIVE VESSEL -  The patient has had no new symptoms. She has had no symptoms since her last stress test. No further cardiovascular testing is suggested.  She wants to participate in cardiac rehabilitation and I have signed his paperwork.  HYPERTENSION -  The blood pressure is at target. No change in medications is indicated. We will continue with therapeutic lifestyle changes (TLC).  Hyperlipidemia - She is on a lower dose of Crestor because a higher dose caused her to have some bladder problems. On this lower dose I will check a lipid profile.

## 2013-08-10 NOTE — Telephone Encounter (Signed)
Paperwork was again completed and given to MR.  A copy of the paperwork was also given to the pt and she took it with her.

## 2013-08-11 ENCOUNTER — Encounter (HOSPITAL_COMMUNITY)
Admission: RE | Admit: 2013-08-11 | Discharge: 2013-08-11 | Disposition: A | Discharge status: Home / Self Care | Payer: Self-pay | Source: Ambulatory Visit | Attending: Cardiology | Admitting: Cardiology

## 2013-08-11 DIAGNOSIS — Z5189 Encounter for other specified aftercare: Secondary | ICD-10-CM | POA: Insufficient documentation

## 2013-08-11 DIAGNOSIS — Z7982 Long term (current) use of aspirin: Secondary | ICD-10-CM | POA: Insufficient documentation

## 2013-08-11 DIAGNOSIS — I209 Angina pectoris, unspecified: Secondary | ICD-10-CM | POA: Insufficient documentation

## 2013-08-11 DIAGNOSIS — E039 Hypothyroidism, unspecified: Secondary | ICD-10-CM | POA: Insufficient documentation

## 2013-08-11 DIAGNOSIS — I2510 Atherosclerotic heart disease of native coronary artery without angina pectoris: Secondary | ICD-10-CM | POA: Insufficient documentation

## 2013-08-11 DIAGNOSIS — Z9861 Coronary angioplasty status: Secondary | ICD-10-CM | POA: Insufficient documentation

## 2013-08-11 NOTE — Progress Notes (Signed)
Oriented patient to cardiac rehab maintenance program. Pt tolerated low intensity exercise without c/o.

## 2013-08-14 ENCOUNTER — Encounter (HOSPITAL_COMMUNITY): Payer: Medicare Other

## 2013-08-16 ENCOUNTER — Encounter (HOSPITAL_COMMUNITY): Payer: Medicare Other

## 2013-08-17 ENCOUNTER — Encounter: Payer: Self-pay | Admitting: Internal Medicine

## 2013-08-18 ENCOUNTER — Encounter (HOSPITAL_COMMUNITY)
Admission: RE | Admit: 2013-08-18 | Discharge: 2013-08-18 | Disposition: A | Discharge status: Home / Self Care | Payer: Self-pay | Source: Ambulatory Visit | Attending: Cardiology | Admitting: Cardiology

## 2013-08-21 ENCOUNTER — Encounter (HOSPITAL_COMMUNITY): Payer: Medicare Other

## 2013-08-22 ENCOUNTER — Other Ambulatory Visit (INDEPENDENT_AMBULATORY_CARE_PROVIDER_SITE_OTHER): Payer: Medicare Other

## 2013-08-22 DIAGNOSIS — E7800 Pure hypercholesterolemia, unspecified: Secondary | ICD-10-CM

## 2013-08-22 LAB — LIPID PANEL
CHOL/HDL RATIO: 3
Cholesterol: 142 mg/dL (ref 0–200)
HDL: 55.4 mg/dL (ref >39.00)
LDL CALC: 69 mg/dL (ref 0–99)
Triglycerides: 87 mg/dL (ref 0.0–149.0)
VLDL: 17.4 mg/dL (ref 0.0–40.0)

## 2013-08-23 ENCOUNTER — Encounter (HOSPITAL_COMMUNITY)
Admission: RE | Admit: 2013-08-23 | Discharge: 2013-08-23 | Disposition: A | Discharge status: Home / Self Care | Payer: Self-pay | Source: Ambulatory Visit | Attending: Cardiology | Admitting: Cardiology

## 2013-08-25 ENCOUNTER — Encounter (HOSPITAL_COMMUNITY)
Admission: RE | Admit: 2013-08-25 | Discharge: 2013-08-25 | Disposition: A | Discharge status: Home / Self Care | Payer: Self-pay | Source: Ambulatory Visit | Attending: Cardiology | Admitting: Cardiology

## 2013-08-28 ENCOUNTER — Encounter (HOSPITAL_COMMUNITY)
Admission: RE | Admit: 2013-08-28 | Discharge: 2013-08-28 | Disposition: A | Discharge status: Home / Self Care | Payer: Self-pay | Source: Ambulatory Visit | Attending: Cardiology | Admitting: Cardiology

## 2013-08-29 ENCOUNTER — Encounter: Payer: Self-pay | Admitting: *Deleted

## 2013-08-30 ENCOUNTER — Encounter (HOSPITAL_COMMUNITY)
Admission: RE | Admit: 2013-08-30 | Discharge: 2013-08-30 | Disposition: A | Discharge status: Home / Self Care | Payer: Self-pay | Source: Ambulatory Visit | Attending: Cardiology | Admitting: Cardiology

## 2013-09-01 ENCOUNTER — Encounter (HOSPITAL_COMMUNITY)
Admission: RE | Admit: 2013-09-01 | Discharge: 2013-09-01 | Disposition: A | Discharge status: Home / Self Care | Payer: Self-pay | Source: Ambulatory Visit | Attending: Cardiology | Admitting: Cardiology

## 2013-09-01 ENCOUNTER — Telehealth: Payer: Self-pay | Admitting: *Deleted

## 2013-09-01 NOTE — Telephone Encounter (Signed)
Aurea GraffJoan from cardiac rehab called regarding pt. She states pt a Dr. Jenene Slickerhochrein's is an maintenance program in cardiac rehab , and she is not connected to an EKG machine, pt was connected to an EKG for irregular heart beat. The obtained strip reads Junctional rhythm with frequent PAC's Aurea GraffJoan will fax the strip.

## 2013-09-01 NOTE — Progress Notes (Signed)
Pt pulse irregular at cardiac rehab maintenance program today.  Pt asymptomatic.  Telemetry strip revealed junctional rhythm, frequent PAC.  Strips faxed Nivda, triage nurse for MD review.

## 2013-09-04 ENCOUNTER — Encounter (HOSPITAL_COMMUNITY): Payer: Medicare Other

## 2013-09-04 DIAGNOSIS — I2510 Atherosclerotic heart disease of native coronary artery without angina pectoris: Secondary | ICD-10-CM | POA: Insufficient documentation

## 2013-09-04 DIAGNOSIS — I209 Angina pectoris, unspecified: Secondary | ICD-10-CM | POA: Insufficient documentation

## 2013-09-04 DIAGNOSIS — Z9861 Coronary angioplasty status: Secondary | ICD-10-CM | POA: Insufficient documentation

## 2013-09-04 DIAGNOSIS — Z7982 Long term (current) use of aspirin: Secondary | ICD-10-CM | POA: Insufficient documentation

## 2013-09-04 DIAGNOSIS — E039 Hypothyroidism, unspecified: Secondary | ICD-10-CM | POA: Insufficient documentation

## 2013-09-04 DIAGNOSIS — Z5189 Encounter for other specified aftercare: Secondary | ICD-10-CM | POA: Insufficient documentation

## 2013-09-04 NOTE — Telephone Encounter (Signed)
Original message was received on 1/30 EKG strip was received today 2/2. Pt was having frequent PVC's, in  Junctional rhythm on Friday when pt was in rehab.  Pt  Was  Asymptomatic at that time. Last EKG done on 08/10/13 pt was in normal sinus rhythm rate 62 beats/minute. Kaitlyn Parks would like for MD to advice. CARDIAC rehab Strips and the last EKG were placed in MD'S BOX TODAY 09/04/13.

## 2013-09-06 ENCOUNTER — Encounter (HOSPITAL_COMMUNITY)
Admission: RE | Admit: 2013-09-06 | Discharge: 2013-09-06 | Disposition: A | Discharge status: Home / Self Care | Payer: Self-pay | Source: Ambulatory Visit | Attending: Cardiology | Admitting: Cardiology

## 2013-09-07 NOTE — Telephone Encounter (Signed)
Dr Antoine PocheHochrein reviewed and requested no changes be made in her treatment at this time.

## 2013-09-08 ENCOUNTER — Encounter (HOSPITAL_COMMUNITY): Payer: Medicare Other

## 2013-09-11 ENCOUNTER — Encounter (HOSPITAL_COMMUNITY)
Admission: RE | Admit: 2013-09-11 | Discharge: 2013-09-11 | Disposition: A | Discharge status: Home / Self Care | Payer: Self-pay | Source: Ambulatory Visit | Attending: Cardiology | Admitting: Cardiology

## 2013-09-13 ENCOUNTER — Encounter (HOSPITAL_COMMUNITY)
Admission: RE | Admit: 2013-09-13 | Discharge: 2013-09-13 | Disposition: A | Discharge status: Home / Self Care | Payer: Self-pay | Source: Ambulatory Visit | Attending: Cardiology | Admitting: Cardiology

## 2013-09-15 ENCOUNTER — Encounter (HOSPITAL_COMMUNITY)
Admission: RE | Admit: 2013-09-15 | Discharge: 2013-09-15 | Disposition: A | Discharge status: Home / Self Care | Payer: Self-pay | Source: Ambulatory Visit | Attending: Cardiology | Admitting: Cardiology

## 2013-09-18 ENCOUNTER — Encounter (HOSPITAL_COMMUNITY)
Admission: RE | Admit: 2013-09-18 | Discharge: 2013-09-18 | Disposition: A | Discharge status: Home / Self Care | Payer: Self-pay | Source: Ambulatory Visit | Attending: Cardiology | Admitting: Cardiology

## 2013-09-20 ENCOUNTER — Encounter (HOSPITAL_COMMUNITY)
Admission: RE | Admit: 2013-09-20 | Discharge: 2013-09-20 | Disposition: A | Discharge status: Home / Self Care | Payer: Self-pay | Source: Ambulatory Visit | Attending: Cardiology | Admitting: Cardiology

## 2013-09-22 ENCOUNTER — Encounter (HOSPITAL_COMMUNITY)
Admission: RE | Admit: 2013-09-22 | Discharge: 2013-09-22 | Disposition: A | Discharge status: Home / Self Care | Payer: Self-pay | Source: Ambulatory Visit | Attending: Cardiology | Admitting: Cardiology

## 2013-09-25 ENCOUNTER — Encounter (HOSPITAL_COMMUNITY)
Admission: RE | Admit: 2013-09-25 | Discharge: 2013-09-25 | Disposition: A | Discharge status: Home / Self Care | Payer: Self-pay | Source: Ambulatory Visit | Attending: Cardiology | Admitting: Cardiology

## 2013-09-27 ENCOUNTER — Encounter (HOSPITAL_COMMUNITY)
Admission: RE | Admit: 2013-09-27 | Discharge: 2013-09-27 | Disposition: A | Discharge status: Home / Self Care | Payer: Self-pay | Source: Ambulatory Visit | Attending: Cardiology | Admitting: Cardiology

## 2013-09-27 ENCOUNTER — Encounter: Payer: Self-pay | Admitting: Cardiology

## 2013-09-28 ENCOUNTER — Ambulatory Visit: Payer: Medicare Other | Admitting: *Deleted

## 2013-09-29 ENCOUNTER — Encounter (HOSPITAL_COMMUNITY)
Admission: RE | Admit: 2013-09-29 | Discharge: 2013-09-29 | Disposition: A | Discharge status: Home / Self Care | Payer: Self-pay | Source: Ambulatory Visit | Attending: Cardiology | Admitting: Cardiology

## 2013-10-02 ENCOUNTER — Encounter (HOSPITAL_COMMUNITY): Payer: Medicare Other

## 2013-10-02 DIAGNOSIS — Z5189 Encounter for other specified aftercare: Secondary | ICD-10-CM | POA: Insufficient documentation

## 2013-10-02 DIAGNOSIS — Z9861 Coronary angioplasty status: Secondary | ICD-10-CM | POA: Insufficient documentation

## 2013-10-02 DIAGNOSIS — E039 Hypothyroidism, unspecified: Secondary | ICD-10-CM | POA: Insufficient documentation

## 2013-10-02 DIAGNOSIS — I2510 Atherosclerotic heart disease of native coronary artery without angina pectoris: Secondary | ICD-10-CM | POA: Insufficient documentation

## 2013-10-02 DIAGNOSIS — Z7982 Long term (current) use of aspirin: Secondary | ICD-10-CM | POA: Insufficient documentation

## 2013-10-02 DIAGNOSIS — I209 Angina pectoris, unspecified: Secondary | ICD-10-CM | POA: Insufficient documentation

## 2013-10-04 ENCOUNTER — Encounter (HOSPITAL_COMMUNITY): Payer: Medicare Other

## 2013-10-06 ENCOUNTER — Encounter (HOSPITAL_COMMUNITY)
Admission: RE | Admit: 2013-10-06 | Discharge: 2013-10-06 | Disposition: A | Discharge status: Home / Self Care | Payer: Self-pay | Source: Ambulatory Visit | Attending: Cardiology | Admitting: Cardiology

## 2013-10-09 ENCOUNTER — Encounter (HOSPITAL_COMMUNITY)
Admission: RE | Admit: 2013-10-09 | Discharge: 2013-10-09 | Disposition: A | Discharge status: Home / Self Care | Payer: Self-pay | Source: Ambulatory Visit | Attending: Cardiology | Admitting: Cardiology

## 2013-10-11 ENCOUNTER — Encounter (HOSPITAL_COMMUNITY)
Admission: RE | Admit: 2013-10-11 | Discharge: 2013-10-11 | Disposition: A | Discharge status: Home / Self Care | Payer: Self-pay | Source: Ambulatory Visit | Attending: Cardiology | Admitting: Cardiology

## 2013-10-13 ENCOUNTER — Encounter (HOSPITAL_COMMUNITY)
Admission: RE | Admit: 2013-10-13 | Discharge: 2013-10-13 | Disposition: A | Discharge status: Home / Self Care | Payer: Self-pay | Source: Ambulatory Visit | Attending: Cardiology | Admitting: Cardiology

## 2013-10-16 ENCOUNTER — Encounter (HOSPITAL_COMMUNITY): Payer: Medicare Other

## 2013-10-18 ENCOUNTER — Encounter (HOSPITAL_COMMUNITY): Payer: Medicare Other

## 2013-10-20 ENCOUNTER — Encounter (HOSPITAL_COMMUNITY)
Admission: RE | Admit: 2013-10-20 | Discharge: 2013-10-20 | Disposition: A | Discharge status: Home / Self Care | Payer: Self-pay | Source: Ambulatory Visit | Attending: Cardiology | Admitting: Cardiology

## 2013-10-23 ENCOUNTER — Ambulatory Visit: Payer: Medicare Other | Admitting: *Deleted

## 2013-10-23 ENCOUNTER — Encounter (HOSPITAL_COMMUNITY)
Admission: RE | Admit: 2013-10-23 | Discharge: 2013-10-23 | Disposition: A | Discharge status: Home / Self Care | Payer: Self-pay | Source: Ambulatory Visit | Attending: Cardiology | Admitting: Cardiology

## 2013-10-25 ENCOUNTER — Ambulatory Visit (INDEPENDENT_AMBULATORY_CARE_PROVIDER_SITE_OTHER): Payer: Medicare Other | Admitting: Family

## 2013-10-25 ENCOUNTER — Encounter: Payer: Self-pay | Admitting: Family

## 2013-10-25 ENCOUNTER — Telehealth: Payer: Self-pay | Admitting: Internal Medicine

## 2013-10-25 ENCOUNTER — Encounter (HOSPITAL_COMMUNITY): Payer: Medicare Other

## 2013-10-25 VITALS — BP 122/74 | HR 77 | Temp 98.3°F | Wt 135.0 lb

## 2013-10-25 DIAGNOSIS — R059 Cough, unspecified: Secondary | ICD-10-CM

## 2013-10-25 DIAGNOSIS — M810 Age-related osteoporosis without current pathological fracture: Secondary | ICD-10-CM

## 2013-10-25 DIAGNOSIS — R05 Cough: Secondary | ICD-10-CM

## 2013-10-25 DIAGNOSIS — J069 Acute upper respiratory infection, unspecified: Secondary | ICD-10-CM

## 2013-10-25 MED ORDER — DENOSUMAB 60 MG/ML ~~LOC~~ SOLN
60.0000 mg | Freq: Once | SUBCUTANEOUS | Status: AC
  Administered 2013-10-25: 60 mg via SUBCUTANEOUS

## 2013-10-25 NOTE — Telephone Encounter (Signed)
Pt saw padonda today, and was told her rx for prednisone would be called in today. Pt states pharmacy has not received it yet and pt states she does not drive at night so wants to make sure it still will get taken care of today.

## 2013-10-25 NOTE — Patient Instructions (Signed)

## 2013-10-25 NOTE — Progress Notes (Signed)
Pre visit review using our clinic review tool, if applicable. No additional management support is needed unless otherwise documented below in the visit note. 

## 2013-10-25 NOTE — Progress Notes (Signed)
Subjective:    Patient ID: Kaitlyn Parks, female    DOB: 06-Feb-1935, 79 y.o.   MRN: [id number]  HPI 79 year old white female, nonsmoker is in today with complaints of cough, sore throat, congestion x5 days. Reports coughing nonproductive. Has been taking Tylenol and Coricidin without much relief.   Review of Systems  Constitutional: Negative.   HENT: Positive for congestion, sneezing and sore throat.   Respiratory: Positive for cough.   Cardiovascular: Negative.   Musculoskeletal: Negative.   Skin: Negative.   Allergic/Immunologic: Negative.   Neurological: Negative.   Psychiatric/Behavioral: Negative.    Past Medical History  Diagnosis Date  . CAD (coronary artery disease)     LAD stenting of a 90% lesion 2000 the left  . Cystocele   . Rectocele   . Urinary incontinence     USI  . Osteoporosis   . Uterine prolapse   . Elevated cholesterol   . Thyroid disease     Hypothyroid  . Scoliosis     History   Social History  . Marital Status: Married    Spouse Name: N/A    Number of Children: N/A  . Years of Education: N/A   Occupational History  . Not on file.   Social History Main Topics  . Smoking status: Never Smoker   . Smokeless tobacco: Never Used  . Alcohol Use: 0.0 oz/week     Comment: Rare  . Drug Use: No  . Sexual Activity: No   Other Topics Concern  . Not on file   Social History Narrative  . No narrative on file    Past Surgical History  Procedure Laterality Date  . Vaginal hysterectomy      LAVH BSO  . Oophorectomy      BSO  . Bladder suspension    . Coronary angioplasty with stent placement      Family History  Problem Relation Age of Onset  . Hypertension Mother   . Heart disease Mother   . Heart disease Father   . Heart disease Sister   . Colon cancer Paternal Aunt   . Breast cancer Paternal Aunt     Age 77's  . Diabetes Sister   . Breast cancer Cousin     Maternal 1st cousins-Age 77's  . Leukemia Paternal Aunt      Allergies  Allergen Reactions  . Pravastatin Sodium Other (See Comments)    cystitis  . Zocor [Simvastatin] Other (See Comments)    cystitis    Current Outpatient Prescriptions on File Prior to Visit  Medication Sig Dispense Refill  . aspirin 81 MG tablet Take 81 mg by mouth daily.        . Calcium Carbonate-Vitamin D (CALCIUM + D PO) Take by mouth daily.      Marland Kitchen. denosumab (PROLIA) 60 MG/ML SOLN injection Inject 60 mg into the skin every 6 (six) months. Administer in upper arm, thigh, or abdomen      . HYDROcodone-acetaminophen (NORCO/VICODIN) 5-325 MG per tablet Take 1 tablet by mouth every 6 (six) hours as needed for pain.  60 tablet  0  . Multiple Vitamins-Calcium (ONE-A-DAY WOMENS PO) Take 1 tablet by mouth daily.       . Omega-3 Fatty Acids (OMEGA 3 PO) Take 2 capsules by mouth daily.       . rosuvastatin (CRESTOR) 5 MG tablet Take 5 mg by mouth daily.      Marland Kitchen. SYNTHROID 50 MCG tablet TAKE 1 TABLET EACH DAY.  90  tablet  3   No current facility-administered medications on file prior to visit.    BP 122/74  Pulse 77  Temp(Src) 98.3 F (36.8 C) (Oral)  Wt 135 lb (61.236 kg)  SpO2 98%chart    Objective:   Physical Exam  Constitutional: She is oriented to person, place, and time. She appears well-developed and well-nourished.  HENT:  Right Ear: External ear normal.  Left Ear: External ear normal.  Nose: Nose normal.  Mouth/Throat: Oropharynx is clear and moist.  Neck: Normal range of motion. Neck supple.  Cardiovascular: Normal rate, regular rhythm and normal heart sounds.   Musculoskeletal: Normal range of motion.  Neurological: She is alert and oriented to person, place, and time.  Skin: Skin is warm and dry.  Psychiatric: She has a normal mood and affect.          Assessment & Plan:  Vineta was seen today for nasal congestion and sore throat.  Diagnoses and associated orders for this visit:  Acute upper respiratory infections of unspecified  site  Osteoporosis - denosumab (PROLIA) injection 60 mg; Inject 60 mg into the skin once.   : The office if any questions or concerns. Recheck as scheduled and as needed.

## 2013-10-26 ENCOUNTER — Telehealth: Payer: Self-pay

## 2013-10-26 MED ORDER — METHYLPREDNISOLONE 4 MG PO KIT: PACK | ORAL | Status: AC

## 2013-10-26 MED ORDER — HYDROCODONE-ACETAMINOPHEN 5-325 MG PO TABS: 1.0000 | ORAL_TABLET | Freq: Four times a day (QID) | ORAL | Status: DC | PRN

## 2013-10-26 NOTE — Telephone Encounter (Signed)
pls advise

## 2013-10-26 NOTE — Telephone Encounter (Signed)
Pt has prescription.

## 2013-10-26 NOTE — Telephone Encounter (Signed)
Sent!

## 2013-10-26 NOTE — Telephone Encounter (Signed)
Pt states her pharm is [facility]. Following up on med request

## 2013-10-26 NOTE — Telephone Encounter (Signed)
Vicodin ordered in error

## 2013-10-27 ENCOUNTER — Encounter (HOSPITAL_COMMUNITY)
Admission: RE | Admit: 2013-10-27 | Discharge: 2013-10-27 | Disposition: A | Discharge status: Home / Self Care | Payer: Self-pay | Source: Ambulatory Visit | Attending: Cardiology | Admitting: Cardiology

## 2013-10-30 ENCOUNTER — Encounter (HOSPITAL_COMMUNITY)
Admission: RE | Admit: 2013-10-30 | Discharge: 2013-10-30 | Disposition: A | Discharge status: Home / Self Care | Payer: Self-pay | Source: Ambulatory Visit | Attending: Cardiology | Admitting: Cardiology

## 2013-10-30 ENCOUNTER — Encounter: Payer: Self-pay | Admitting: Cardiology

## 2013-11-01 ENCOUNTER — Telehealth: Payer: Self-pay | Admitting: Cardiology

## 2013-11-01 ENCOUNTER — Encounter (HOSPITAL_COMMUNITY): Payer: Medicare Other

## 2013-11-01 NOTE — Telephone Encounter (Signed)
Spoke with pt who reports that last Saturday am (3/28) when it was cold outside she experienced an intense pain in her back under her left shoulder blade that took her breath away.  She denies any SOB other than with the initial pain, no N/V, diaphoresis or radiation. She walked to her car and drove herself home. This pain lasted about 3 to 4 mins and resolved on its own.  She did not have her SL Ntg with her.  She has had some very slight "residual back pain" but she has not tried anything to relieve it.  She reports having a upper respiratory infection and was seen on 3/25 for and treated with prednisone.  States she has had a recent cough.  Advised to use Ntg if back reoccurs and call 911 to be taken to ED for further evaluation if back pain not relieved by 3 rd SL Ntg.  She states understanding.  She has an appt scheduled.

## 2013-11-01 NOTE — Telephone Encounter (Signed)
New Message  Pt called states that she is having pain in her back that is intense. She believes that it was due to being out in the cold.. Pt states that she has another partially blocked arteries and Pain in left shoulder blade. Gave next available appt with NP Norma FredricksonLori Gerhardt 11/24/2013. Please call pt to discuss

## 2013-11-03 ENCOUNTER — Encounter (HOSPITAL_COMMUNITY): Payer: Medicare Other

## 2013-11-06 ENCOUNTER — Encounter (HOSPITAL_COMMUNITY): Payer: Medicare Other

## 2013-11-07 ENCOUNTER — Telehealth: Payer: Self-pay | Admitting: Cardiology

## 2013-11-07 MED ORDER — ROSUVASTATIN CALCIUM 10 MG PO TABS: 5.0000 mg | ORAL_TABLET | Freq: Every day | ORAL | Status: DC

## 2013-11-07 NOTE — Telephone Encounter (Signed)
New message     Pt want a refill of crestor 5mg .  Want 10mg  (so she can cut them in half) presc called in to [facility]----she got the original presc august 2013 and have had samples from our office and Dr York RamJenkin's office (PCP) for many many refills. She said her chart may not reflect this

## 2013-11-08 ENCOUNTER — Encounter (HOSPITAL_COMMUNITY): Payer: Medicare Other

## 2013-11-10 ENCOUNTER — Encounter (HOSPITAL_COMMUNITY): Payer: Medicare Other

## 2013-11-13 ENCOUNTER — Encounter (HOSPITAL_COMMUNITY): Payer: Medicare Other

## 2013-11-15 ENCOUNTER — Encounter (HOSPITAL_COMMUNITY): Payer: Medicare Other

## 2013-11-17 ENCOUNTER — Encounter: Payer: Self-pay | Admitting: Internal Medicine

## 2013-11-17 ENCOUNTER — Ambulatory Visit (INDEPENDENT_AMBULATORY_CARE_PROVIDER_SITE_OTHER): Payer: Medicare Other | Admitting: Internal Medicine

## 2013-11-17 ENCOUNTER — Encounter (HOSPITAL_COMMUNITY): Payer: Medicare Other

## 2013-11-17 VITALS — BP 130/80 | HR 80 | Temp 98.4°F | Wt 132.0 lb

## 2013-11-17 DIAGNOSIS — Z1211 Encounter for screening for malignant neoplasm of colon: Secondary | ICD-10-CM

## 2013-11-17 DIAGNOSIS — M810 Age-related osteoporosis without current pathological fracture: Secondary | ICD-10-CM

## 2013-11-17 DIAGNOSIS — E785 Hyperlipidemia, unspecified: Secondary | ICD-10-CM

## 2013-11-17 DIAGNOSIS — E039 Hypothyroidism, unspecified: Secondary | ICD-10-CM

## 2013-11-17 NOTE — Progress Notes (Signed)
Subjective:    Patient ID: Kaitlyn Parks, female    DOB: 01-06-1935, 79 y.o.   MRN: [id number]  HPI On crestor and thyroid replacement Usually has CPX in June and will schedule  Had in Lipid january   Review of Systems  Constitutional: Negative for activity change, appetite change and fatigue.  HENT: Negative for congestion, ear pain, postnasal drip and sinus pressure.   Eyes: Negative for redness and visual disturbance.  Respiratory: Negative for cough, shortness of breath and wheezing.   Gastrointestinal: Negative for abdominal pain and abdominal distention.  Genitourinary: Negative for dysuria, frequency and menstrual problem.  Musculoskeletal: Negative for arthralgias, joint swelling, myalgias and neck pain.  Skin: Negative for rash and wound.  Neurological: Negative for dizziness, weakness and headaches.  Hematological: Negative for adenopathy. Does not bruise/bleed easily.  Psychiatric/Behavioral: Negative for sleep disturbance and decreased concentration.   Past Medical History  Diagnosis Date  . CAD (coronary artery disease)     LAD stenting of a 90% lesion 2000 the left  . Cystocele   . Rectocele   . Urinary incontinence     USI  . Osteoporosis   . Uterine prolapse   . Elevated cholesterol   . Thyroid disease     Hypothyroid  . Scoliosis     History   Social History  . Marital Status: Married    Spouse Name: N/A    Number of Children: N/A  . Years of Education: N/A   Occupational History  . Not on file.   Social History Main Topics  . Smoking status: Never Smoker   . Smokeless tobacco: Never Used  . Alcohol Use: 0.0 oz/week     Comment: Rare  . Drug Use: No  . Sexual Activity: No   Other Topics Concern  . Not on file   Social History Narrative  . No narrative on file    Past Surgical History  Procedure Laterality Date  . Vaginal hysterectomy      LAVH BSO  . Oophorectomy      BSO  . Bladder suspension    . Coronary angioplasty with  stent placement      Family History  Problem Relation Age of Onset  . Hypertension Mother   . Heart disease Mother   . Heart disease Father   . Heart disease Sister   . Colon cancer Paternal Aunt   . Breast cancer Paternal Aunt     Age 760's  . Diabetes Sister   . Breast cancer Cousin     Maternal 1st cousins-Age 77's  . Leukemia Paternal Aunt     Allergies  Allergen Reactions  . Pravastatin Sodium Other (See Comments)    cystitis  . Zocor [Simvastatin] Other (See Comments)    cystitis    Current Outpatient Prescriptions on File Prior to Visit  Medication Sig Dispense Refill  . aspirin 81 MG tablet Take 81 mg by mouth daily.        . Calcium Carbonate-Vitamin D (CALCIUM + D PO) Take by mouth daily.      Marland Kitchen. denosumab (PROLIA) 60 MG/ML SOLN injection Inject 60 mg into the skin every 6 (six) months. Administer in upper arm, thigh, or abdomen      . Multiple Vitamins-Calcium (ONE-A-DAY WOMENS PO) Take 1 tablet by mouth daily.       . Omega-3 Fatty Acids (OMEGA 3 PO) Take 2 capsules by mouth daily.       . rosuvastatin (CRESTOR) 10 MG  tablet Take 0.5 tablets (5 mg total) by mouth daily.  30 tablet  6  . SYNTHROID 50 MCG tablet TAKE 1 TABLET EACH DAY.  90 tablet  3   No current facility-administered medications on file prior to visit.    BP 130/80  Pulse 80  Temp(Src) 98.4 F (36.9 C) (Oral)  Wt 132 lb (59.875 kg)       Objective:   Physical Exam  Constitutional: She is oriented to person, place, and time. She appears well-developed and well-nourished.  HENT:  Head: Normocephalic and atraumatic.  Eyes: Conjunctivae are normal. Pupils are equal, round, and reactive to light.  Neck: Normal range of motion. Neck supple.  Cardiovascular: Regular rhythm.   Pulmonary/Chest: Effort normal and breath sounds normal.  Abdominal: Soft. Bowel sounds are normal.  Neurological: She is oriented to person, place, and time.          Assessment & Plan:  Last bone density seems  to have been 2009 and is on prolia  Cardiology check in Jan with lipids  Stool cards due to being 77 vs colon  As she is a "young [age]" i would recommend colon  Refer for colon  Set up CPX with padonda  Set up bone density  Husband  Had a CVA and MI and has prognosis issues

## 2013-11-17 NOTE — Patient Instructions (Signed)
The patient is instructed to continue all medications as prescribed. Schedule followup with check out clerk upon leaving the clinic  

## 2013-11-17 NOTE — Progress Notes (Signed)
Pre visit review using our clinic review tool, if applicable. No additional management support is needed unless otherwise documented below in the visit note. 

## 2013-11-20 ENCOUNTER — Encounter: Payer: Self-pay | Admitting: Internal Medicine

## 2013-11-20 ENCOUNTER — Encounter (HOSPITAL_COMMUNITY): Payer: Medicare Other

## 2013-11-20 ENCOUNTER — Ambulatory Visit: Payer: Medicare Other | Admitting: Nurse Practitioner

## 2013-11-22 ENCOUNTER — Encounter (HOSPITAL_COMMUNITY): Payer: Medicare Other

## 2013-11-24 ENCOUNTER — Ambulatory Visit: Payer: Medicare Other | Admitting: Nurse Practitioner

## 2013-11-24 ENCOUNTER — Encounter (HOSPITAL_COMMUNITY): Payer: Medicare Other

## 2013-11-27 ENCOUNTER — Encounter (HOSPITAL_COMMUNITY): Payer: Medicare Other

## 2013-11-27 ENCOUNTER — Telehealth: Payer: Self-pay | Admitting: Internal Medicine

## 2013-11-27 NOTE — Telephone Encounter (Signed)
Order was faxed to [facility]

## 2013-11-27 NOTE — Telephone Encounter (Signed)
Pt has appt [facility] for bone density test on 12-06-13. Please fax order to [phone]. Pt has [facility] complete aarp

## 2013-11-29 ENCOUNTER — Encounter (HOSPITAL_COMMUNITY): Payer: Medicare Other

## 2013-12-29 ENCOUNTER — Encounter: Payer: Self-pay | Admitting: Internal Medicine

## 2014-01-01 ENCOUNTER — Ambulatory Visit (AMBULATORY_SURGERY_CENTER): Payer: Medicare Other | Admitting: *Deleted

## 2014-01-01 ENCOUNTER — Telehealth: Payer: Self-pay | Admitting: *Deleted

## 2014-01-01 VITALS — Ht 64.0 in | Wt 131.8 lb

## 2014-01-01 DIAGNOSIS — Z1211 Encounter for screening for malignant neoplasm of colon: Secondary | ICD-10-CM

## 2014-01-01 MED ORDER — MOVIPREP 100 G PO SOLR: ORAL | Status: DC

## 2014-01-01 NOTE — Progress Notes (Signed)
No allergies to eggs or soy. No problems with anesthesia.  Pt given Emmi instructions for colonoscopy  No oxygen use  No diet drug use  

## 2014-01-01 NOTE — Telephone Encounter (Signed)
Dr [name]: pt is 79 years old and is scheduled for direct colonoscopy 01/15/14.  Last colonoscopy 2007 with Dr Jarold Motto.  Normal colonoscopy.  Report is in EPIC.  Pt is here for PV today; she says she thought her colonoscopy was more than 10 years ago; that is why she scheduled.  She has hx of colon cancer in paternal aunt at age 9.  Pt is not having any GI issues.  She is interested in Cologuard vs. screening colonoscopy. Is she due for recall colon now and does she need OV to discuss Cologuard with you?  Thanks, Olegario Messier

## 2014-01-01 NOTE — Telephone Encounter (Signed)
Pt notified that recall colon due in 2017. Recall entered into computer.  She may want to make OV at that time to discuss cologuard.  Colonoscopy scheduled for 01/15/14 cancelled

## 2014-01-01 NOTE — Telephone Encounter (Signed)
She can in 2 years, when it will have been 10 years from her previous normal colonoscopy.

## 2014-01-12 ENCOUNTER — Ambulatory Visit (INDEPENDENT_AMBULATORY_CARE_PROVIDER_SITE_OTHER): Payer: Medicare Other | Admitting: Emergency Medicine

## 2014-01-12 ENCOUNTER — Ambulatory Visit (INDEPENDENT_AMBULATORY_CARE_PROVIDER_SITE_OTHER): Payer: Medicare Other

## 2014-01-12 VITALS — BP 126/78 | HR 65 | Temp 98.0°F | Resp 17 | Ht 64.0 in | Wt 134.0 lb

## 2014-01-12 DIAGNOSIS — M25561 Pain in right knee: Secondary | ICD-10-CM

## 2014-01-12 DIAGNOSIS — M549 Dorsalgia, unspecified: Secondary | ICD-10-CM

## 2014-01-12 DIAGNOSIS — M25569 Pain in unspecified knee: Secondary | ICD-10-CM

## 2014-01-12 MED ORDER — HYDROCODONE-ACETAMINOPHEN 5-325 MG PO TABS: 1.0000 | ORAL_TABLET | Freq: Four times a day (QID) | ORAL | Status: DC | PRN

## 2014-01-12 NOTE — Progress Notes (Addendum)
Subjective:  This chart was scribed for Kaitlyn ChrisSteven Georganna Maxson, MD by Elveria Risingimelie Horne, Medial Scribe. This patient was seen in room 9 and the patient's care was started at 5:43 PM.    Patient ID: Kaitlyn Parks, female    DOB: 02/09/1935, 79 y.o.   MRN: [id number]  HPI  HPI Comments: Kaitlyn Parks is a 79 y.o. female with history of chronic back pain and scoliosis who presents to the [facility] complaining of worsening, sharp back pain. Patient reports that she was last treated for her back pain one year ago. Patient reports having a spur between the 4th and 5th vertebra. Patient reports that the pain radiates to her right hip and is exacerbated with movement and certain positions. Patient reports numbness in both feet with moving in certain positions. Patient also mentions presentation of new right knee pain. Patient has treated pain with remaining Hydrocodone 5 mg last night, with relief. Patient has taken Aleve today, but reports no relief. Patient denies bladder and bowel incontinence. Patient exercises regularly: walking, swimming/water aerobics, and yoga.   Imaging performed 06/14: scoliosis, advaned degenerative changes at L3 and L4 found.   Patient Active Problem List   Diagnosis Date Noted  . Elevated cholesterol   . Thyroid disease   . Cystocele   . Rectocele   . Urinary incontinence   . Osteoporosis   . Uterine prolapse   . OSTEOPOROSIS 07/01/2010  . MIXED HYPERLIPIDEMIA 05/12/2010  . CAD, NATIVE VESSEL 05/09/2010  . ARM PAIN 05/01/2010  . ABNORMAL STRESS ELECTROCARDIOGRAM 04/18/2010  . BACK PAIN, LUMBOSACRAL, CHRONIC 07/23/2009  . CHRONIC FRONTAL SINUSITIS 10/25/2008  . LOC OSTEOARTHROS NOT SPEC WHETHER PRIM/SEC HAND 04/04/2008  . HYPERTENSION 08/11/2007  . ALLERGIC RHINITIS 08/11/2007  . HEADACHE 08/11/2007  . HYPOTHYROIDISM 06/08/2007  . MITRAL VALVE PROLAPSE 06/08/2007  . DERMATITIS, CONTACT, NEC 05/04/2007   Past Medical History  Diagnosis Date  . CAD  (coronary artery disease)     LAD stenting of a 90% lesion 2000 the left  . Cystocele   . Rectocele   . Urinary incontinence     USI  . Osteoporosis   . Uterine prolapse   . Elevated cholesterol   . Thyroid disease     Hypothyroid  . Scoliosis   . Arthritis    Past Surgical History  Procedure Laterality Date  . Vaginal hysterectomy  2011    LAVH BSO  . Oophorectomy  2011    BSO  . Bladder suspension  2011  . Coronary angioplasty with stent placement  2012   Allergies  Allergen Reactions  . Pravastatin Sodium Other (See Comments)    cystitis  . Zocor [Simvastatin] Other (See Comments)    cystitis   Prior to Admission medications   Medication Sig Start Date End Date Taking? Authorizing Provider  aspirin 81 MG tablet Take 81 mg by mouth daily.     Yes Historical Provider, MD  Calcium Carbonate-Vitamin D (CALCIUM + D PO) Take by mouth daily.   Yes Historical Provider, MD  denosumab (PROLIA) 60 MG/ML SOLN injection Inject 60 mg into the skin every 6 (six) months. Administer in upper arm, thigh, or abdomen   Yes Historical Provider, MD  MOVIPREP 100 G SOLR moviprep as directed. No substitutions 01/01/14  Yes Hilarie FredricksonJohn N Perry, MD  Multiple Vitamins-Calcium (ONE-A-DAY WOMENS PO) Take 1 tablet by mouth daily.    Yes Historical Provider, MD  Multiple Vitamins-Minerals (EYE VITAMINS PO) Take by mouth daily.  Yes Historical Provider, MD  Omega-3 Fatty Acids (OMEGA 3 PO) Take 2 capsules by mouth daily.    Yes Historical Provider, MD  rosuvastatin (CRESTOR) 10 MG tablet Take 0.5 tablets (5 mg total) by mouth daily. 11/07/13  Yes Rollene RotundaJames Hochrein, MD  SYNTHROID 50 MCG tablet TAKE 1 TABLET EACH DAY. 06/06/13  Yes Stacie GlazeJohn E Jenkins, MD   History   Social History  . Marital Status: Married    Spouse Name: N/A    Number of Children: N/A  . Years of Education: N/A   Occupational History  . Not on file.   Social History Main Topics  . Smoking status: Never Smoker   . Smokeless tobacco: Never  Used  . Alcohol Use: 0.0 oz/week     Comment: Rare  . Drug Use: No  . Sexual Activity: No   Other Topics Concern  . Not on file   Social History Narrative  . No narrative on file      Review of Systems  Constitutional: Negative for fever.  Musculoskeletal: Positive for arthralgias and back pain.       Objective:   Physical Exam  CONSTITUTIONAL: Well developed/well nourished HEAD: Normocephalic/atraumatic EYES: EOMI/PERRL ENMT: Mucous membranes moist NECK: supple no meningeal signs SPINE:entire spine nontender; scoliotic curve, tender over L5, S1 and L4, L5;  CV: S1/S2 noted, no murmurs/rubs/gallops noted LUNGS: Lungs are clear to auscultation bilaterally, no apparent distress ABDOMEN: soft, nontender, no rebound or guarding GU: no cva tenderness NEURO: Pt is awake/alert, moves all extremitiesx4 EXTREMITIES: pulses normal, full ROM; knee reflexes 2+, left ankle 1+, right ankle none  SKIN: warm, color normal PSYCH: no abnormalities of mood noted  Filed Vitals:   01/12/14 1726  BP: 126/78  Pulse: 65  Temp: 98 F (36.7 C)  Resp: 17   [facility] reading (PRIMARY) by  Dr.Remy Dia there mild degenerative changes of the knee. Meds ordered this encounter  Medications  . HYDROcodone-acetaminophen (NORCO) 5-325 MG per tablet    Sig: Take 1 tablet by mouth every 6 (six) hours as needed.    Dispense:  16 tablet    Refill:  0       Assessment & Plan:  We'll treat with Aleve 1-2 twice a day she was given a limited number of hydrocodone for pain. She will contact her PCP on Monday to see about getting reestablished with physical therapy. I suspect she had some mild injury to the medial cartilage of the right knee during yoga class.I personally performed the services described in this documentation, which was scribed in my presence. The recorded information has been reviewed and is accurate.

## 2014-01-12 NOTE — Patient Instructions (Signed)
Back Pain, Adult Low back pain is very common. About 1 in 5 people have back pain.The cause of low back pain is rarely dangerous. The pain often gets better over time.About half of people with a sudden onset of back pain feel better in just 2 weeks. About 8 in 10 people feel better by 6 weeks.  CAUSES Some common causes of back pain include:  Strain of the muscles or ligaments supporting the spine.  Wear and tear (degeneration) of the spinal discs.  Arthritis.  Direct injury to the back. DIAGNOSIS Most of the time, the direct cause of low back pain is not known.However, back pain can be treated effectively even when the exact cause of the pain is unknown.Answering your caregiver's questions about your overall health and symptoms is one of the most accurate ways to make sure the cause of your pain is not dangerous. If your caregiver needs more information, he or she may order lab work or imaging tests (X-rays or MRIs).However, even if imaging tests show changes in your back, this usually does not require surgery. HOME CARE INSTRUCTIONS For many people, back pain returns.Since low back pain is rarely dangerous, it is often a condition that people can learn to manageon their own.   Remain active. It is stressful on the back to sit or stand in one place. Do not sit, drive, or stand in one place for more than 30 minutes at a time. Take short walks on level surfaces as soon as pain allows.Try to increase the length of time you walk each day.  Do not stay in bed.Resting more than 1 or 2 days can delay your recovery.  Do not avoid exercise or work.Your body is made to move.It is not dangerous to be active, even though your back may hurt.Your back will likely heal faster if you return to being active before your pain is gone.  Pay attention to your body when you bend and lift. Many people have less discomfortwhen lifting if they bend their knees, keep the load close to their bodies,and  avoid twisting. Often, the most comfortable positions are those that put less stress on your recovering back.  Find a comfortable position to sleep. Use a firm mattress and lie on your side with your knees slightly bent. If you lie on your back, put a pillow under your knees.  Only take over-the-counter or prescription medicines as directed by your caregiver. Over-the-counter medicines to reduce pain and inflammation are often the most helpful.Your caregiver may prescribe muscle relaxant drugs.These medicines help dull your pain so you can more quickly return to your normal activities and healthy exercise.  Put ice on the injured area.  Put ice in a plastic bag.  Place a towel between your skin and the bag.  Leave the ice on for 15-20 minutes, 03-04 times a day for the first 2 to 3 days. After that, ice and heat may be alternated to reduce pain and spasms.  Ask your caregiver about trying back exercises and gentle massage. This may be of some benefit.  Avoid feeling anxious or stressed.Stress increases muscle tension and can worsen back pain.It is important to recognize when you are anxious or stressed and learn ways to manage it.Exercise is a great option. SEEK MEDICAL CARE IF:  You have pain that is not relieved with rest or medicine.  You have pain that does not improve in 1 week.  You have new symptoms.  You are generally not feeling well. SEEK   IMMEDIATE MEDICAL CARE IF:   You have pain that radiates from your back into your legs.  You develop new bowel or bladder control problems.  You have unusual weakness or numbness in your arms or legs.  You develop nausea or vomiting.  You develop abdominal pain.  You feel faint. Document Released: 07/20/2005 Document Revised: 01/19/2012 Document Reviewed: 12/08/2010 [employer] Patient Information 2014 [employer].  

## 2014-01-15 ENCOUNTER — Encounter: Payer: Medicare Other | Admitting: Internal Medicine

## 2014-02-16 ENCOUNTER — Encounter: Payer: Medicare Other | Admitting: Family

## 2014-02-19 ENCOUNTER — Encounter: Payer: Self-pay | Admitting: Family

## 2014-02-19 ENCOUNTER — Telehealth: Payer: Self-pay | Admitting: Internal Medicine

## 2014-02-19 ENCOUNTER — Ambulatory Visit (INDEPENDENT_AMBULATORY_CARE_PROVIDER_SITE_OTHER): Payer: Medicare Other | Admitting: Family

## 2014-02-19 VITALS — BP 124/82 | HR 64 | Temp 97.7°F | Ht 64.25 in | Wt 134.0 lb

## 2014-02-19 DIAGNOSIS — Z23 Encounter for immunization: Secondary | ICD-10-CM

## 2014-02-19 DIAGNOSIS — E785 Hyperlipidemia, unspecified: Secondary | ICD-10-CM

## 2014-02-19 DIAGNOSIS — I10 Essential (primary) hypertension: Secondary | ICD-10-CM

## 2014-02-19 DIAGNOSIS — E039 Hypothyroidism, unspecified: Secondary | ICD-10-CM

## 2014-02-19 DIAGNOSIS — Z0000 Encounter for general adult medical examination without abnormal findings: Secondary | ICD-10-CM

## 2014-02-19 DIAGNOSIS — J301 Allergic rhinitis due to pollen: Secondary | ICD-10-CM

## 2014-02-19 LAB — POCT URINALYSIS DIPSTICK
Bilirubin, UA: NEGATIVE
Blood, UA: NEGATIVE
Glucose, UA: NEGATIVE
Ketones, UA: NEGATIVE
NITRITE UA: NEGATIVE
PH UA: 7.5
Spec Grav, UA: 1.02
Urobilinogen, UA: 0.2

## 2014-02-19 LAB — CBC WITH DIFFERENTIAL/PLATELET
BASOS PCT: 0.4 % (ref 0.0–3.0)
Basophils Absolute: 0 10*3/uL (ref 0.0–0.1)
EOS PCT: 5.4 % — AB (ref 0.0–5.0)
Eosinophils Absolute: 0.2 10*3/uL (ref 0.0–0.7)
HCT: 41.7 % (ref 36.0–46.0)
HEMOGLOBIN: 13.8 g/dL (ref 12.0–15.0)
LYMPHS PCT: 23 % (ref 12.0–46.0)
Lymphs Abs: 1 10*3/uL (ref 0.7–4.0)
MCHC: 33 g/dL (ref 30.0–36.0)
MCV: 91.6 fl (ref 78.0–100.0)
MONOS PCT: 11.5 % (ref 3.0–12.0)
Monocytes Absolute: 0.5 10*3/uL (ref 0.1–1.0)
NEUTROS ABS: 2.7 10*3/uL (ref 1.4–7.7)
NEUTROS PCT: 59.7 % (ref 43.0–77.0)
Platelets: 248 10*3/uL (ref 150.0–400.0)
RBC: 4.55 Mil/uL (ref 3.87–5.11)
RDW: 13.2 % (ref 11.5–15.5)
WBC: 4.5 10*3/uL (ref 4.0–10.5)

## 2014-02-19 LAB — HEPATIC FUNCTION PANEL
ALT: 33 U/L (ref 0–35)
AST: 32 U/L (ref 0–37)
Albumin: 3.8 g/dL (ref 3.5–5.2)
Alkaline Phosphatase: 47 U/L (ref 39–117)
Bilirubin, Direct: 0.1 mg/dL (ref 0.0–0.3)
TOTAL PROTEIN: 6.9 g/dL (ref 6.0–8.3)
Total Bilirubin: 0.4 mg/dL (ref 0.2–1.2)

## 2014-02-19 LAB — BASIC METABOLIC PANEL
BUN: 19 mg/dL (ref 6–23)
CHLORIDE: 105 meq/L (ref 96–112)
CO2: 29 meq/L (ref 19–32)
CREATININE: 0.7 mg/dL (ref 0.4–1.2)
Calcium: 8.9 mg/dL (ref 8.4–10.5)
GFR: 83.5 mL/min (ref >60.00)
GLUCOSE: 97 mg/dL (ref 70–99)
POTASSIUM: 3.8 meq/L (ref 3.5–5.1)
Sodium: 140 mEq/L (ref 135–145)

## 2014-02-19 LAB — TSH: TSH: 3.32 u[IU]/mL (ref 0.35–4.50)

## 2014-02-19 MED ORDER — FLUTICASONE PROPIONATE 50 MCG/ACT NA SUSP: 2.0000 | Freq: Every day | NASAL | Status: DC

## 2014-02-19 NOTE — Patient Instructions (Addendum)
Continue to exercise daily, at least 30 minutes per day, 5x per week. Eat healthy diet with lots of healthy fruits and vegetables. Avoid foods high in saturated fats and sodium.  Allergic Rhinitis Allergic rhinitis is when the mucous membranes in the nose respond to allergens. Allergens are particles in the air that cause your body to have an allergic reaction. This causes you to release allergic antibodies. Through a chain of events, these eventually cause you to release histamine into the blood stream. Although meant to protect the body, it is this release of histamine that causes your discomfort, such as frequent sneezing, congestion, and an itchy, runny nose.  CAUSES  Seasonal allergic rhinitis (hay fever) is caused by pollen allergens that may come from grasses, trees, and weeds. Year-round allergic rhinitis (perennial allergic rhinitis) is caused by allergens such as house dust mites, pet dander, and mold spores.  SYMPTOMS   Nasal stuffiness (congestion).  Itchy, runny nose with sneezing and tearing of the eyes. DIAGNOSIS  Your health care provider can help you determine the allergen or allergens that trigger your symptoms. If you and your health care provider are unable to determine the allergen, skin or blood testing may be used. TREATMENT  Allergic rhinitis does not have a cure, but it can be controlled by:  Medicines and allergy shots (immunotherapy).  Avoiding the allergen. Hay fever may often be treated with antihistamines in pill or nasal spray forms. Antihistamines block the effects of histamine. There are over-the-counter medicines that may help with nasal congestion and swelling around the eyes. Check with your health care provider before taking or giving this medicine.  If avoiding the allergen or the medicine prescribed do not work, there are many new medicines your health care provider can prescribe. Stronger medicine may be used if initial measures are ineffective.  Desensitizing injections can be used if medicine and avoidance does not work. Desensitization is when a patient is given ongoing shots until the body becomes less sensitive to the allergen. Make sure you follow up with your health care provider if problems continue. HOME CARE INSTRUCTIONS It is not possible to completely avoid allergens, but you can reduce your symptoms by taking steps to limit your exposure to them. It helps to know exactly what you are allergic to so that you can avoid your specific triggers. SEEK MEDICAL CARE IF:   You have a fever.  You develop a cough that does not stop easily (persistent).  You have shortness of breath.  You start wheezing.  Symptoms interfere with normal daily activities. Document Released: 04/14/2001 Document Revised: 07/25/2013 Document Reviewed: 03/27/2013 [facility] Patient Information 2015 [place], [place]. This information is not intended to replace advice given to you by your health care provider. Make sure you discuss any questions you have with your health care provider.

## 2014-02-19 NOTE — Telephone Encounter (Signed)
Relevant patient education assigned to patient using Emmi. ° °

## 2014-02-19 NOTE — Progress Notes (Signed)
Pre visit review using our clinic review tool, if applicable. No additional management support is needed unless otherwise documented below in the visit note. 

## 2014-02-19 NOTE — Progress Notes (Signed)
Subjective:    Patient ID: Kaitlyn Parks, female    DOB: 1934/09/14, 79 y.o.   MRN: [id number]  HPI  79 y.o. White female presents today for a routine physical. I reviewed all health maintenance protocols including mammography, colonoscopy, bone density. No referrals were needed. Age and diagnosis  appropriate screening labs were ordered. Her immunization history was reviewed and appropriate vaccinations were ordered. Her current medications and allergies were reviewed and needed refills of her chronic medications were ordered. The plan for yearly health maintenance was discussed all orders and referrals were made as appropriate. Pt had a chief complaint of front sinus pressure that began on July 10ths after a trip to [place]. Pt describes pressure that radiates to her head. She denies nasal discharge, denies fever, chills, fatigue and change in appetite. Pt states she has taken over the counter medications such as mucinex and tylenol but they have not helped.     Review of Systems  Constitutional: Negative.  Negative for fever, chills, diaphoresis and fatigue.  HENT: Positive for postnasal drip, sinus pressure and sore throat.   Eyes: Negative.   Respiratory: Negative.   Cardiovascular: Negative.   Gastrointestinal: Negative.   Endocrine: Negative.   Genitourinary: Positive for dysuria.       Acknowledges chronic dysuria.   Musculoskeletal: Positive for arthralgias and gait problem.       Acknowledges L3 and L4 hip problem. Is followed at [facility] center and receives physical therapy.   Skin: Negative.   Allergic/Immunologic: Negative.   Neurological: Positive for headaches.  Hematological: Negative.   Psychiatric/Behavioral: Negative.    Past Medical History  Diagnosis Date  . CAD (coronary artery disease)     LAD stenting of a 90% lesion 2000 the left  . Cystocele   . Rectocele   . Urinary incontinence     USI  . Osteoporosis   . Uterine prolapse   . Elevated cholesterol    . Thyroid disease     Hypothyroid  . Scoliosis   . Arthritis     History   Social History  . Marital Status: Married    Spouse Name: N/A    Number of Children: N/A  . Years of Education: N/A   Occupational History  . Not on file.   Social History Main Topics  . Smoking status: Never Smoker   . Smokeless tobacco: Never Used  . Alcohol Use: 0.0 oz/week     Comment: Rare  . Drug Use: No  . Sexual Activity: No   Other Topics Concern  . Not on file   Social History Narrative  . No narrative on file    Past Surgical History  Procedure Laterality Date  . Vaginal hysterectomy  2011    LAVH BSO  . Oophorectomy  2011    BSO  . Bladder suspension  2011  . Coronary angioplasty with stent placement  2012    Family History  Problem Relation Age of Onset  . Hypertension Mother   . Heart disease Mother   . Heart disease Father   . Heart disease Sister   . Colon cancer Paternal Aunt 81  . Breast cancer Paternal Aunt     Age 66's  . Diabetes Sister   . Breast cancer Cousin     Maternal 1st cousins-Age 52's  . Leukemia Paternal Aunt     Allergies  Allergen Reactions  . Pravastatin Sodium Other (See Comments)    cystitis  . Zocor [Simvastatin] Other (  See Comments)    cystitis    Current Outpatient Prescriptions on File Prior to Visit  Medication Sig Dispense Refill  . aspirin 81 MG tablet Take 81 mg by mouth daily.        . Calcium Carbonate-Vitamin D (CALCIUM + D PO) Take by mouth daily.      Marland Kitchen denosumab (PROLIA) 60 MG/ML SOLN injection Inject 60 mg into the skin every 6 (six) months. Administer in upper arm, thigh, or abdomen      . HYDROcodone-acetaminophen (NORCO) 5-325 MG per tablet Take 1 tablet by mouth every 6 (six) hours as needed.  16 tablet  0  . MOVIPREP 100 G SOLR moviprep as directed. No substitutions  1 kit  0  . Multiple Vitamins-Calcium (ONE-A-DAY WOMENS PO) Take 1 tablet by mouth daily.       . Multiple Vitamins-Minerals (EYE VITAMINS PO) Take  by mouth daily.      . Omega-3 Fatty Acids (OMEGA 3 PO) Take 2 capsules by mouth daily.       . rosuvastatin (CRESTOR) 10 MG tablet Take 0.5 tablets (5 mg total) by mouth daily.  30 tablet  6  . SYNTHROID 50 MCG tablet TAKE 1 TABLET EACH DAY.  90 tablet  3   No current facility-administered medications on file prior to visit.    BP 124/82  Pulse 64  Temp(Src) 97.7 F (36.5 C) (Oral)  Ht 5' 4.25" (1.632 m)  Wt 134 lb (60.782 kg)  BMI 22.82 kg/m2chart    Objective:   Physical Exam  Constitutional: She is oriented to person, place, and time. She appears well-developed and well-nourished. She is active.  HENT:  Head: Normocephalic.  Right Ear: Tympanic membrane, external ear and ear canal normal.  Left Ear: Tympanic membrane, external ear and ear canal normal.  Nose: Right sinus exhibits no maxillary sinus tenderness and no frontal sinus tenderness. Left sinus exhibits no maxillary sinus tenderness and no frontal sinus tenderness.  Mouth/Throat: Uvula is midline, oropharynx is clear and moist and mucous membranes are normal.  Eyes: Conjunctivae, EOM and lids are normal. Pupils are equal, round, and reactive to light.  Neck: Trachea normal, normal range of motion and full passive range of motion without pain. Neck supple.  Cardiovascular: Normal rate, regular rhythm, normal heart sounds, intact distal pulses and normal pulses.   Pulmonary/Chest: Effort normal and breath sounds normal.  Abdominal: Soft. Normal appearance and bowel sounds are normal.  Musculoskeletal: Normal range of motion.  Neurological: She is alert and oriented to person, place, and time. She has normal strength and normal reflexes. No cranial nerve deficit or sensory deficit.  Skin: Skin is warm, dry and intact.  Psychiatric: Her speech is normal and behavior is normal. Judgment and thought content normal. Cognition and memory are normal.          Assessment & Plan:  [name] was seen today for annual  exam.  Diagnoses and associated orders for this visit:  Unspecified essential hypertension - CBC with Differential - Basic Metabolic Panel - Hepatic Function Panel - POC Urinalysis Dipstick  Other and unspecified hyperlipidemia - Basic Metabolic Panel - Hepatic Function Panel - Cancel: Lipid Panel - POC Urinalysis Dipstick  Unspecified hypothyroidism - TSH  Need for prophylactic vaccination against Streptococcus pneumoniae (pneumococcus) - Pneumococcal conjugate vaccine 13-valent  Allergic rhinitis due to pollen  Other Orders - fluticasone (FLONASE) 50 MCG/ACT nasal spray; Place 2 sprays into both nostrils daily.   Education: Continue exercising 30 minutes per  day at least 5x per week. Eat diet high in fruits and vegetables and avoid foods high in saturated fats and sodium. Medications discussed in detail.

## 2014-04-10 ENCOUNTER — Encounter: Payer: Self-pay | Admitting: Family Medicine

## 2014-04-10 ENCOUNTER — Ambulatory Visit (INDEPENDENT_AMBULATORY_CARE_PROVIDER_SITE_OTHER): Payer: Medicare Other | Admitting: Family Medicine

## 2014-04-10 VITALS — BP 118/64 | HR 58 | Temp 97.9°F | Ht 64.25 in | Wt 137.0 lb

## 2014-04-10 DIAGNOSIS — L259 Unspecified contact dermatitis, unspecified cause: Secondary | ICD-10-CM

## 2014-04-10 DIAGNOSIS — L309 Dermatitis, unspecified: Secondary | ICD-10-CM

## 2014-04-10 MED ORDER — TRIAMCINOLONE ACETONIDE 0.1 % EX CREA: 1.0000 "application " | TOPICAL_CREAM | Freq: Two times a day (BID) | CUTANEOUS | Status: DC

## 2014-04-10 NOTE — Progress Notes (Signed)
No chief complaint on file.   HPI:  Acute visit for:  1) Rash: -started yesterday -after using jergens natural glow sun tanner and then playing golf, she is allergic to poison ivy -she also has been eating a lot of new foods - lots of muscadine grapes -has tried anti -itch cream -itchy rash on arms, legs and trunk -no hx of allergies or rash - except she is very sensitive and gets rash in the sun -denies: insect or tick bites, malaise, fevers, chills, HA, SOB  ROS: See pertinent positives and negatives per HPI.  Past Medical History  Diagnosis Date  . CAD (coronary artery disease)     LAD stenting of a 90% lesion 2000 the left  . Cystocele   . Rectocele   . Urinary incontinence     USI  . Osteoporosis   . Uterine prolapse   . Elevated cholesterol   . Thyroid disease     Hypothyroid  . Scoliosis   . Arthritis     Past Surgical History  Procedure Laterality Date  . Vaginal hysterectomy  2011    LAVH BSO  . Oophorectomy  2011    BSO  . Bladder suspension  2011  . Coronary angioplasty with stent placement  2012    Family History  Problem Relation Age of Onset  . Hypertension Mother   . Heart disease Mother   . Heart disease Father   . Heart disease Sister   . Colon cancer Paternal Aunt 55  . Breast cancer Paternal Aunt     Age 28's  . Diabetes Sister   . Breast cancer Cousin     Maternal 1st cousins-Age 2's  . Leukemia Paternal Aunt     History   Social History  . Marital Status: Married    Spouse Name: N/A    Number of Children: N/A  . Years of Education: N/A   Social History Main Topics  . Smoking status: Never Smoker   . Smokeless tobacco: Never Used  . Alcohol Use: 0.0 oz/week     Comment: Rare  . Drug Use: No  . Sexual Activity: No   Other Topics Concern  . None   Social History Narrative  . None    Current outpatient prescriptions:aspirin 81 MG tablet, Take 81 mg by mouth daily.  , Disp: , Rfl: ;  Calcium Carbonate-Vitamin D  (CALCIUM + D PO), Take by mouth daily., Disp: , Rfl: ;  denosumab (PROLIA) 60 MG/ML SOLN injection, Inject 60 mg into the skin every 6 (six) months. Administer in upper arm, thigh, or abdomen, Disp: , Rfl:  HYDROcodone-acetaminophen (NORCO) 5-325 MG per tablet, Take 1 tablet by mouth every 6 (six) hours as needed., Disp: 16 tablet, Rfl: 0;  Multiple Vitamins-Calcium (ONE-A-DAY WOMENS PO), Take 1 tablet by mouth daily. , Disp: , Rfl: ;  Multiple Vitamins-Minerals (EYE VITAMINS PO), Take by mouth daily., Disp: , Rfl: ;  Omega-3 Fatty Acids (OMEGA 3 PO), Take 2 capsules by mouth daily. , Disp: , Rfl:  rosuvastatin (CRESTOR) 10 MG tablet, Take 0.5 tablets (5 mg total) by mouth daily., Disp: 30 tablet, Rfl: 6;  SYNTHROID 50 MCG tablet, TAKE 1 TABLET EACH DAY., Disp: 90 tablet, Rfl: 3;  triamcinolone cream (KENALOG) 0.1 %, Apply 1 application topically 2 (two) times daily., Disp: 30 g, Rfl: 0  EXAM:  Filed Vitals:   04/10/14 1108  BP: 118/64  Pulse: 58  Temp: 97.9 F (36.6 C)    Body mass index  is 23.33 kg/(m^2).  GENERAL: vitals reviewed and listed above, alert, oriented, appears well hydrated and in no acute distress  HEENT: atraumatic, conjunttiva clear, no obvious abnormalities on inspection of external nose and ears  NECK: no obvious masses on inspection  LUNGS: clear to auscultation bilaterally, no wheezes, rales or rhonchi, good air movement  CV: HRRR, no peripheral edema  SKIN: erythematous maculopapular rash on the arms, legs, trunck  MS: moves all extremities without noticeable abnormality  PSYCH: pleasant and cooperative, no obvious depression or anxiety  ASSESSMENT AND PLAN:  Discussed the following assessment and plan:  Dermatitis - Plan: triamcinolone cream (KENALOG) 0.1 %  -appearance of contact dermatitis -discussed other potential etiologies and treatment -she opted for topical steroid and antihistamine with close follow up and return precautions discussed,  avoidance of new products, hypoallergenic regimen, allergy testing  -Patient advised to return or notify a doctor immediately if symptoms worsen or persist or new concerns arise.  Patient Instructions  -avoid the sun lotion or any new product - stick to hypoallergenic products for now  -zyrtec or claritin daily  -follow up immediatly if worsening or new symptoms       KIM, [name].

## 2014-04-10 NOTE — Patient Instructions (Signed)
-  avoid the sun lotion or any new product - stick to hypoallergenic products for now  -zyrtec or claritin daily  -follow up immediatly if worsening or new symptoms

## 2014-04-10 NOTE — Progress Notes (Signed)
Pre visit review using our clinic review tool, if applicable. No additional management support is needed unless otherwise documented below in the visit note. 

## 2014-06-04 ENCOUNTER — Encounter: Payer: Self-pay | Admitting: Family Medicine

## 2014-06-11 ENCOUNTER — Telehealth: Payer: Self-pay | Admitting: Internal Medicine

## 2014-06-11 NOTE — Telephone Encounter (Signed)
[facility] [place] - [address] Is requesting re-fill on SYNTHROID 50 MCG tablet

## 2014-06-12 MED ORDER — LEVOTHYROXINE SODIUM 50 MCG PO TABS: ORAL_TABLET | ORAL | Status: DC

## 2014-06-12 NOTE — Telephone Encounter (Signed)
3 mth supply sent in, pt is going to need to establish with a new provider.  Please schedule

## 2014-06-12 NOTE — Telephone Encounter (Signed)
Pt is sch to transfer to Dr. Selena BattenKim in August 2016 and I scheduled her a med-check appt 08/06/14.

## 2014-07-19 LAB — HM MAMMOGRAPHY

## 2014-08-06 ENCOUNTER — Ambulatory Visit (INDEPENDENT_AMBULATORY_CARE_PROVIDER_SITE_OTHER): Payer: Medicare Other | Admitting: *Deleted

## 2014-08-06 ENCOUNTER — Encounter: Payer: Self-pay | Admitting: Family Medicine

## 2014-08-06 ENCOUNTER — Ambulatory Visit (INDEPENDENT_AMBULATORY_CARE_PROVIDER_SITE_OTHER): Payer: Medicare Other | Admitting: Family Medicine

## 2014-08-06 VITALS — BP 128/78 | HR 73 | Temp 97.3°F | Ht 63.0 in | Wt 132.8 lb

## 2014-08-06 DIAGNOSIS — M810 Age-related osteoporosis without current pathological fracture: Secondary | ICD-10-CM | POA: Diagnosis not present

## 2014-08-06 DIAGNOSIS — E039 Hypothyroidism, unspecified: Secondary | ICD-10-CM

## 2014-08-06 DIAGNOSIS — Z23 Encounter for immunization: Secondary | ICD-10-CM | POA: Diagnosis not present

## 2014-08-06 DIAGNOSIS — E782 Mixed hyperlipidemia: Secondary | ICD-10-CM | POA: Diagnosis not present

## 2014-08-06 NOTE — Progress Notes (Signed)
Pre visit review using our clinic review tool, if applicable. No additional management support is needed unless otherwise documented below in the visit note. 

## 2014-08-06 NOTE — Progress Notes (Signed)
HPI:   Med Check: Scheduled for N[facility]  Hypothyroid: -meds: levothyroxine -last TSH reviewed and ok -denies: hot/cold intol, palpitations, sig weight changes  CAD/HLD: -meds: asa, crestor -denies: CP, SOB, DOE, swelling -hx stent in 2011  Osteoporosis: -meds: prolia -wants to cont prolia - referral to endocrine -reports hx sig osteoporosis for a long time -some weight bearing exercise -takes one a day -no hx fx, menses 15, early menopause in mid 40s, hysterectomy in 2009 complete  ROS: See pertinent positives and negatives per HPI.  Past Medical History  Diagnosis Date  . CAD (coronary artery disease)     LAD stenting of a 90% lesion 2000 the left  . Cystocele   . Rectocele   . Urinary incontinence     USI  . Osteoporosis   . Uterine prolapse   . Elevated cholesterol   . Thyroid disease     Hypothyroid  . Scoliosis   . Arthritis     Past Surgical History  Procedure Laterality Date  . Vaginal hysterectomy  2011    LAVH BSO  . Oophorectomy  2011    BSO  . Bladder suspension  2011  . Coronary angioplasty with stent placement  2012    Family History  Problem Relation Age of Onset  . Hypertension Mother   . Heart disease Mother   . Heart disease Father   . Heart disease Sister   . Colon cancer Paternal Aunt 65  . Breast cancer Paternal Aunt     Age 76's  . Diabetes Sister   . Breast cancer Cousin     Maternal 1st cousins-Age 1's  . Leukemia Paternal Aunt     History   Social History  . Marital Status: Married    Spouse Name: N/A    Number of Children: N/A  . Years of Education: N/A   Social History Main Topics  . Smoking status: Never Smoker   . Smokeless tobacco: Never Used  . Alcohol Use: 0.0 oz/week     Comment: Rare  . Drug Use: No  . Sexual Activity: No   Other Topics Concern  . None   Social History Narrative    Current outpatient prescriptions: aspirin 81 MG tablet, Take 81 mg by mouth daily.  , Disp: , Rfl: ;  Calcium  Carbonate-Vitamin D (CALCIUM + D PO), Take by mouth daily., Disp: , Rfl: ;  denosumab (PROLIA) 60 MG/ML SOLN injection, Inject 60 mg into the skin every 6 (six) months. Administer in upper arm, thigh, or abdomen, Disp: , Rfl:  HYDROcodone-acetaminophen (NORCO) 5-325 MG per tablet, Take 1 tablet by mouth every 6 (six) hours as needed., Disp: 16 tablet, Rfl: 0;  levothyroxine (SYNTHROID) 50 MCG tablet, TAKE 1 TABLET EACH DAY., Disp: 90 tablet, Rfl: 0;  Multiple Vitamins-Calcium (ONE-A-DAY WOMENS PO), Take 1 tablet by mouth daily. , Disp: , Rfl: ;  Multiple Vitamins-Minerals (EYE VITAMINS PO), Take by mouth daily., Disp: , Rfl:  Omega-3 Fatty Acids (OMEGA 3 PO), Take 2 capsules by mouth daily. , Disp: , Rfl: ;  rosuvastatin (CRESTOR) 10 MG tablet, Take 0.5 tablets (5 mg total) by mouth daily., Disp: 30 tablet, Rfl: 6;  triamcinolone cream (KENALOG) 0.1 %, Apply 1 application topically 2 (two) times daily., Disp: 30 g, Rfl: 0  EXAM:  Filed Vitals:   08/06/14 1306  BP: 128/78  Pulse: 73  Temp: 97.3 F (36.3 C)    Body mass index is 23.53 kg/(m^2).  GENERAL: vitals reviewed and listed  above, alert, oriented, appears well hydrated and in no acute distress  HEENT: atraumatic, conjunttiva clear, no obvious abnormalities on inspection of external nose and ears  NECK: no obvious masses on inspection  LUNGS: clear to auscultation bilaterally, no wheezes, rales or rhonchi, good air movement  CV: HRRR, no peripheral edema  MS: moves all extremities without noticeable abnormality  PSYCH: pleasant and cooperative, no obvious depression or anxiety  ASSESSMENT AND PLAN:  Discussed the following assessment and plan:  Hypothyroidism, unspecified hypothyroidism type - Plan: TSH  Mixed hyperlipidemia - Plan: Lipid Panel  Osteoporosis - Plan: Ambulatory referral to Endocrinology  -Patient advised to return or notify a doctor immediately if symptoms worsen or persist or new concerns arise.  Patient  Instructions  BEFORE YOU LEAVE: -schedule fasting lab appointment -flu shot  We recommend the following healthy lifestyle measures: - eat a healthy diet consisting of lots of vegetables, fruits, beans, nuts, seeds, healthy meats such as white chicken and fish and whole grains.  - avoid fried foods, fast food, processed foods, sodas, red meet and other fattening foods.  - get a least 150 minutes of aerobic exercise per week.       Kaitlyn Basque R.

## 2014-08-06 NOTE — Patient Instructions (Signed)
BEFORE YOU LEAVE: -schedule fasting lab appointment -flu shot  We recommend the following healthy lifestyle measures: - eat a healthy diet consisting of lots of vegetables, fruits, beans, nuts, seeds, healthy meats such as white chicken and fish and whole grains.  - avoid fried foods, fast food, processed foods, sodas, red meet and other fattening foods.  - get a least 150 minutes of aerobic exercise per week.

## 2014-08-15 ENCOUNTER — Other Ambulatory Visit (INDEPENDENT_AMBULATORY_CARE_PROVIDER_SITE_OTHER): Payer: Medicare Other

## 2014-08-15 DIAGNOSIS — E039 Hypothyroidism, unspecified: Secondary | ICD-10-CM

## 2014-08-15 DIAGNOSIS — E782 Mixed hyperlipidemia: Secondary | ICD-10-CM

## 2014-08-15 LAB — LIPID PANEL
Cholesterol: 168 mg/dL (ref 0–200)
HDL: 55.1 mg/dL (ref >39.00)
LDL CALC: 87 mg/dL (ref 0–99)
NonHDL: 112.9
TRIGLYCERIDES: 132 mg/dL (ref 0.0–149.0)
Total CHOL/HDL Ratio: 3
VLDL: 26.4 mg/dL (ref 0.0–40.0)

## 2014-08-15 LAB — TSH: TSH: 2.12 u[IU]/mL (ref 0.35–4.50)

## 2014-09-10 ENCOUNTER — Ambulatory Visit (INDEPENDENT_AMBULATORY_CARE_PROVIDER_SITE_OTHER): Payer: Medicare Other | Admitting: Internal Medicine

## 2014-09-10 ENCOUNTER — Telehealth: Payer: Self-pay | Admitting: *Deleted

## 2014-09-10 ENCOUNTER — Encounter: Payer: Self-pay | Admitting: Internal Medicine

## 2014-09-10 VITALS — BP 116/68 | HR 63 | Temp 97.4°F | Resp 12 | Ht 63.5 in | Wt 134.8 lb

## 2014-09-10 DIAGNOSIS — M810 Age-related osteoporosis without current pathological fracture: Secondary | ICD-10-CM

## 2014-09-10 LAB — BASIC METABOLIC PANEL
BUN: 24 mg/dL — AB (ref 6–23)
CO2: 31 meq/L (ref 19–32)
Calcium: 9.5 mg/dL (ref 8.4–10.5)
Chloride: 103 mEq/L (ref 96–112)
Creatinine, Ser: 0.77 mg/dL (ref 0.40–1.20)
GFR: 77.17 mL/min (ref >60.00)
GLUCOSE: 92 mg/dL (ref 70–99)
POTASSIUM: 4.4 meq/L (ref 3.5–5.1)
Sodium: 138 mEq/L (ref 135–145)

## 2014-09-10 LAB — VITAMIN D 25 HYDROXY (VIT D DEFICIENCY, FRACTURES): VITD: 35.71 ng/mL (ref 30.00–100.00)

## 2014-09-10 NOTE — Progress Notes (Signed)
Patient ID: Kaitlyn Parks, female   DOB: [birth year], 80 y.o.   MRN: [id number]   HPI  Kaitlyn Parks is a 80 y.o.-year-old female, referred by her PCP, Dr. Selena BattenKim, for management of osteoporosis.  Pt was dx with OP in ~2006. She denies fractures or falls. No dizziness/vertigo/orthostasis.  I reviewed pt's DEXA scans: Date L1-L4 T score FN T score 33% distal Radius  12/13/2013 uninterpretable 2/2 severe scoliosis RFN: - 2.1 LFN: -1.8 - 2.9  Her BMD significantly increased since 2009, as mentioned in the last DEXA report, but the other reports not available for review.  She has been on the following OP treatments:  - Fosamax - "years" ago - Prolia - started 3 years ago >> then stopped ~ 1 year ago (had 4 doses). Husband had a AMI and stroke end of 2014 >> a lot of stress.  No h/o vitamin D deficiency. No recent vit D level available for review.  Pt is on 1 calcium tab a day and 1 MVI a day She also eats dairy (yoghurt, cheese) and some green, leafy, vegetables - 3x a week.   No weight bearing exercises. She walks; golf - summer; yoga.   She does not take high vitamin A doses.  No h/o hyper/hypocalcemia. No h/o hyperparathyroidism. No h/o kidney stones. Lab Results  Component Value Date   PTH 46.0 07/01/2010   CALCIUM 8.9 02/19/2014   CALCIUM 9.1 01/24/2013   CALCIUM 8.8 12/28/2012   CALCIUM 9.0 07/01/2010   CALCIUM 9.1 06/23/2010   CALCIUM 8.8 04/26/2010   CALCIUM 8.9 04/25/2010   CALCIUM 9.1 04/18/2010   CALCIUM 8.9 03/21/2010   CALCIUM 8.9 03/20/2010   No h/o thyrotoxicosis. Has hypothyroidism  - for 25 years. Reviewed TSH recent levels:  Lab Results  Component Value Date   TSH 2.12 08/15/2014   TSH 3.32 02/19/2014   TSH 2.01 12/28/2012   TSH 0.60 04/13/2011   TSH 1.04 07/01/2010   No h/o CKD. Last BUN/Cr: Lab Results  Component Value Date   BUN 19 02/19/2014   CREATININE 0.7 02/19/2014   Menopause was at 80 y/o.   Pt does not have a FH of osteoporosis or  fracture.  I reviewed her chart and she also has a history of CAD - stent to LAD in 2011.   ROS: Constitutional: + weight gain, no fatigue, no subjective hyperthermia/hypothermia, + nocturia Eyes: no blurry vision, no xerophthalmia ENT: no sore throat, no nodules palpated in throat, no dysphagia/odynophagia, no hoarseness Cardiovascular: no CP/SOB/palpitations/leg swelling Respiratory: no cough/SOB Gastrointestinal: no N/V/D/C Musculoskeletal: + both: muscle/joint aches Skin: no rashes Neurological: no tremors/numbness/tingling/dizziness Psychiatric: no depression/anxiety  Past Medical History  Diagnosis Date  . CAD (coronary artery disease)     LAD stenting of a 90% lesion 2000 the left  . Cystocele   . Rectocele   . Urinary incontinence     USI  . Osteoporosis   . Uterine prolapse   . Elevated cholesterol   . Thyroid disease     Hypothyroid  . Scoliosis   . Arthritis    Past Surgical History  Procedure Laterality Date  . Vaginal hysterectomy  2011    LAVH BSO  . Oophorectomy  2011    BSO  . Bladder suspension  2011  . Coronary angioplasty with stent placement  2012   History   Social History  . Marital Status: Married    Spouse Name: N/A    Number of Children: 2 daughters  Occupational History  . retired.   Social History Main Topics  . Smoking status: Never Smoker   . Smokeless tobacco: Never Used  . Alcohol Use: Wine, 1-2x a week     Comment: Rare  . Drug Use: No   Current Outpatient Prescriptions on File Prior to Visit  Medication Sig Dispense Refill  . aspirin 81 MG tablet Take 81 mg by mouth daily.      . Calcium Carbonate-Vitamin D (CALCIUM + D PO) Take by mouth daily.    Marland Kitchen denosumab (PROLIA) 60 MG/ML SOLN injection Inject 60 mg into the skin every 6 (six) months. Administer in upper arm, thigh, or abdomen    . HYDROcodone-acetaminophen (NORCO) 5-325 MG per tablet Take 1 tablet by mouth every 6 (six) hours as needed. 16 tablet 0  .  levothyroxine (SYNTHROID) 50 MCG tablet TAKE 1 TABLET EACH DAY. 90 tablet 0  . Multiple Vitamins-Calcium (ONE-A-DAY WOMENS PO) Take 1 tablet by mouth daily.     . Multiple Vitamins-Minerals (EYE VITAMINS PO) Take by mouth daily.    . Omega-3 Fatty Acids (OMEGA 3 PO) Take 2 capsules by mouth daily.     . rosuvastatin (CRESTOR) 10 MG tablet Take 0.5 tablets (5 mg total) by mouth daily. 30 tablet 6  . triamcinolone cream (KENALOG) 0.1 % Apply 1 application topically 2 (two) times daily. (Patient not taking: Reported on 09/10/2014) 30 g 0   No current facility-administered medications on file prior to visit.   Allergies  Allergen Reactions  . Pravastatin Sodium Other (See Comments)    cystitis  . Zocor [Simvastatin] Other (See Comments)    cystitis   Family History  Problem Relation Age of Onset  . Hypertension Mother   . Heart disease Mother   . Heart disease Father   . Heart disease Sister   . Colon cancer Paternal Aunt 67  . Breast cancer Paternal Aunt     Age 14's  . Diabetes Sister   . Breast cancer Cousin     Maternal 1st cousins-Age 67's  . Leukemia Paternal Aunt    PE: BP 116/68 mmHg  Pulse 63  Temp(Src) 97.4 F (36.3 C) (Oral)  Resp 12  Ht 5' 3.5" (1.613 m)  Wt 134 lb 12.8 oz (61.145 kg)  BMI 23.50 kg/m2  SpO2 97% Wt Readings from Last 3 Encounters:  09/10/14 134 lb 12.8 oz (61.145 kg)  08/06/14 132 lb 12.8 oz (60.238 kg)  04/10/14 137 lb (62.143 kg)   Constitutional: normal weight, in NAD. No kyphosis. Eyes: PERRLA, EOMI, no exophthalmos ENT: moist mucous membranes, no thyromegaly, no cervical lymphadenopathy Cardiovascular: RRR, No MRG Respiratory: CTA B Gastrointestinal: abdomen soft, NT, ND, BS+ Musculoskeletal: no deformities, strength intact in all 4 Skin: moist, warm, no rashes Neurological: no tremor with outstretched hands, DTR normal in all 4  Assessment: 1. Osteoporosis  Plan: 1. Osteoporosis - likely postmenopausal  - Discussed about  increased risk of fracture, depending on the T score, greatly increased when the T score is lower than -2.5, but it is actually a continuum and -2.5 should not be regarded as an absolute threshold. We reviewed her DEXA report and images together, and I explained that based on the T scores, she has an increased risk for fractures. We could not analyze her spine BMD 2/2 severe scoliosis, but we can use the UD radius as a surrogate (BMD was -2.9 >> osteoporosis range) - We discussed about the different medication classes, benefits and side effects (including  atypical fractures and ONJ - no dental workup in progress or planned). She had 2 years of Prolia and tolerated it well. Would like to continue. We discussed that, per the latest studies, there is continuous increased BMD over 6 years of Prolia use. - we will check her vit D and BMP and then start a PA for Prolia if all normal. - we reviewed her dietary and supplemental calcium and vitamin D intake, which I believe are adequate.  I advised her to continue her supplementation for now - given her specific instructions about food sources for these - see pt instructions  - advised to try to maintain a BMI of ~24 - discussed fall precautions   - given handout from [facility] Re: weight bearing exercises - advised to do this every day or at least 5/7 days. Discussed about specific exercises to try. - will check a new DEXA scan in 1-2 years after starting Prolia - explained that after she finishes Prolia, we would need to continue with a Bisphosphonate to avoid BMD abrupt loss- will see pt back in a year  - time spent with the patient: 1 hour, of which >50% was spent in obtaining information about her osteoporosis, reviewing her previous labs, evaluations, and treatments, counseling her about her condition (please see the discussed topics above), and developing a plan to further investigate and treat it; she had a number of questions which I  addressed.  Office Visit on 09/10/2014  Component Date Value Ref Range Status  . Sodium 09/10/2014 138  135 - 145 mEq/L Final  . Potassium 09/10/2014 4.4  3.5 - 5.1 mEq/L Final  . Chloride 09/10/2014 103  96 - 112 mEq/L Final  . CO2 09/10/2014 31  19 - 32 mEq/L Final  . Glucose, Bld 09/10/2014 92  70 - 99 mg/dL Final  . BUN 16/05/9603 24* 6 - 23 mg/dL Final  . Creatinine, Ser 09/10/2014 0.77  0.40 - 1.20 mg/dL Final  . Calcium 54/04/8118 9.5  8.4 - 10.5 mg/dL Final  . GFR 14/78/2956 77.17  >60.00 mL/min Final  . VITD 09/10/2014 35.71  30.00 - 100.00 ng/mL Final   Labs normal >> will start Prolia PA form.

## 2014-09-10 NOTE — Patient Instructions (Signed)
Please stop at the lab.  Please continue the calcium and multivitamin as you are taking them now.  How Can I Prevent Falls? Men and women with osteoporosis need to take care not to fall down. Falls can break bones. Some reasons people fall are: Poor vision  Poor balance  Certain diseases that affect how you walk  Some types of medicine, such as sleeping pills.  Some tips to help prevent falls outdoors are: Use a cane or walker  Wear rubber-soled shoes so you don't slip  Walk on grass when sidewalks are slippery  In winter, put salt or kitty litter on icy sidewalks.  Some ways to help prevent falls indoors are: Keep rooms free of clutter, especially on floors  Use plastic or carpet runners on slippery floors  Wear low-heeled shoes that provide good support  Do not walk in socks, stockings, or slippers  Be sure carpets and area rugs have skid-proof backs or are tacked to the floor  Be sure stairs are well lit and have rails on both sides  Put grab bars on bathroom walls near tub, shower, and toilet  Use a rubber bath mat in the shower or tub  Keep a flashlight next to your bed  Use a sturdy step stool with a handrail and wide steps  Add more lights in rooms (and night lights) Buy a cordless phone to keep with you so that you don't have to rush to the phone       when it rings and so that you can call for help if you fall.   (adapted from http://www.niams.[employer]essTraining.at)  Dietary sources of calcium and vitamin D:  Calcium content (mg) - http://www.niams.https://www.gonzalez.org/  Fortified oatmeal, 1 packet 350  Sardines, canned in oil, with edible bones, 3 oz. 324  Cheddar cheese, 1 oz. shredded 306  Milk, nonfat, 1 cup 302  Milkshake, 1 cup 300  Yogurt, plain, low-fat, 1 cup 300  Soybeans, cooked, 1 cup 261  Tofu, firm, with calcium,  cup 204  Orange juice, fortified with calcium, 6 oz. 200-260 (varies)  Salmon,  canned, with edible bones, 3 oz. 181  Pudding, instant, made with 2% milk,  cup 153  Baked beans, 1 cup 142  Cottage cheese, 1% milk fat, 1 cup 138  Spaghetti, lasagna, 1 cup 125  Frozen yogurt, vanilla, soft-serve,  cup 103  Ready-to-eat cereal, fortified with calcium, 1 cup 100-1,000 (varies)  Cheese pizza, 1 slice 100  Fortified waffles, 2 100  Turnip greens, boiled,  cup 99  Broccoli, raw, 1 cup 90  Ice cream, vanilla,  cup 85  Soy or rice milk, fortified with calcium, 1 cup 80-500 (varies)   Vitamin D content (International Units, IU) - https://www.ars.usda.gov Cod liver oil, 1 tablespoon 1,360  Swordfish, cooked, 3 oz 566  Salmon (sockeye), cooked, 3 oz 447  Tuna fish, canned in water, drained, 3 oz 154  Orange juice fortified with vitamin D, 1 cup (check product labels, as amount of added vitamin D varies) 137  Milk, nonfat, reduced fat, and whole, vitamin D-fortified, 1 cup 115-124  Yogurt, fortified with 20% of the daily value for vitamin D, 6 oz 80  Margarine, fortified, 1 tablespoon 60  Sardines, canned in oil, drained, 2 sardines 46  Liver, beef, cooked, 3 oz 42  Egg, 1 large (vitamin D is found in yolk) 41  Ready-to-eat cereal, fortified with 10% of the daily value for vitamin D, 0.75-1 cup  40  Cheese, Swiss, 1 oz 6  Exercise for Strong Bones (from [place]) There are two types of exercises that are important for building and maintaining bone density:  weight-bearing and muscle-strengthening exercises. Weight-bearing Exercises These exercises include activities that make you move against gravity while staying upright. Weight-bearing exercises can be high-impact or low-impact. High-impact weight-bearing exercises help build bones and keep them strong. If you have broken a bone due to osteoporosis or are at risk of breaking a bone, you may need to avoid high-impact exercises. If you're not sure, you should check with your healthcare  provider. Examples of high-impact weight-bearing exercises are: . Dancing . Doing high-impact aerobics . Hiking . Jogging/running . Jumping Rope . Stair climbing . Tennis Low-impact weight-bearing exercises can also help keep bones strong and are a safe alternative if you cannot do high-impact exercises. Examples of low-impact weight-bearing exercises are: . Using elliptical training machines . Doing low-impact aerobics . Using stair-step machines . Fast walking on a treadmill or outside Muscle-Strengthening Exercises These exercises include activities where you move your body, a weight or some other resistance against gravity. They are also known as resistance exercises and include: . Lifting weights . Using elastic exercise bands . Using weight machines . Lifting your own body weight . Functional movements, such as standing and rising up on your toes Yoga and Pilates can also improve strength, balance and flexibility. However, certain positions may not be safe for people with osteoporosis or those at increased risk of broken bones. For example, exercises that have you bend forward may increase the chance of breaking a bone in the spine. A physical therapist should be able to help you learn which exercises are safe and appropriate for you. Non-Impact Exercises Non-impact exercises can help you to improve balance, posture and how well you move in everyday activities. These exercises can also help to increase muscle strength and decrease the risk of falls and broken bones. Some of these exercises include: . Balance exercises that strengthen your legs and test your balance, such as Tai Chi, can decrease your risk of falls. . Posture exercises that improve your posture and reduce rounded or "sloping" shoulders can help you decrease the chance of breaking a bone, especially in the spine. . Functional exercises that improve how well you move can help you with everyday activities and decrease  your chance of falling and breaking a bone. For example, if you have trouble getting up from a chair or climbing stairs, you should do these activities as exercises. A physical therapist can teach you balance, posture and functional exercises. Starting a New Exercise Program If you haven't exercised regularly for a while, check with your healthcare provider before beginning a new exercise program-particularly if you have health problems such as heart disease, diabetes or high blood pressure. If you're at high risk of breaking a bone, you should work with a physical therapist to develop a safe exercise program. Once you have your healthcare provider's approval, start slowly. If you've already broken bones in the spine because of osteoporosis, be very careful to avoid activities that require reaching down, bending forward, rapid twisting motions, heavy lifting and those that increase your chance of a fall. As you get started, your muscles may feel sore for a day or two after you exercise. If soreness lasts longer, you may be working too hard and need to ease up. Exercises should be done in a pain-free range of motion. How Much Exercise Do You Need? Weight-bearing exercises 30 minutes on most days  of the week. Do a 30-minutesession or multiple sessions spread out throughout the day. The benefits to your bones are the same.   Muscle-strengthening exercises Two to three days per week. If you don't have much time for strengthening/resistance training, do small amounts at a time. You can do just one body part each day. For example do arms one day, legs the next and trunk the next. You can also spread these exercises out during your normal day.  Balance, posture and functional exercises Every day or as often as needed. You may want to focus on one area more than the others. If you have fallen or lose your balance, spend time doing balance exercises. If you are getting rounded shoulders, work more on posture  exercises. If you have trouble climbing stairs or getting up from the couch, do more functional exercises. You can also perform these exercises at one time or spread them during your day. Work with a phyiscal therapist to learn the right exercises for you.

## 2014-09-10 NOTE — Telephone Encounter (Signed)
Kaitlyn Parks, will you initiate a PA for Prolia for this pt? Thank you in advance!

## 2014-09-11 NOTE — Telephone Encounter (Signed)
Thank you, Rose.  

## 2014-09-11 NOTE — Telephone Encounter (Signed)
I have electronically submitted pt's info for Prolia insurance verification and will notify you once I have a response. Thank you. °

## 2014-09-13 ENCOUNTER — Other Ambulatory Visit: Payer: Self-pay

## 2014-09-13 ENCOUNTER — Other Ambulatory Visit: Payer: Self-pay | Admitting: Family Medicine

## 2014-09-13 NOTE — Telephone Encounter (Signed)
I have rec'd pt's Prolia insurance verification. Whether an OV is billed or not pt will have an estimated responsibility of a $25 co-pay. I have sent summary of benefits to be scanned into Ms. Mccullers's chart. Please let me know if you have any questions. Thank you.

## 2014-09-13 NOTE — Telephone Encounter (Signed)
Rx request for synthroid 50 mcg tablet-Take 1 tablet each day #90  Pharm:  [facility]  Pls advise.  Pt will establish with Dr. Selena BattenKim on 8.1.2016

## 2014-09-14 ENCOUNTER — Telehealth: Payer: Self-pay

## 2014-09-14 NOTE — Telephone Encounter (Signed)
Pt scheduled for prolia

## 2014-09-14 NOTE — Telephone Encounter (Signed)
Pt informed of prolia copay. Kaitlyn MilletMegan has scheduled her for her injection.

## 2014-09-19 ENCOUNTER — Ambulatory Visit (INDEPENDENT_AMBULATORY_CARE_PROVIDER_SITE_OTHER): Payer: Medicare Other | Admitting: *Deleted

## 2014-09-19 ENCOUNTER — Other Ambulatory Visit (INDEPENDENT_AMBULATORY_CARE_PROVIDER_SITE_OTHER): Payer: Medicare Other | Admitting: *Deleted

## 2014-09-19 DIAGNOSIS — M810 Age-related osteoporosis without current pathological fracture: Secondary | ICD-10-CM

## 2014-09-19 MED ORDER — DENOSUMAB 60 MG/ML ~~LOC~~ SOLN
60.0000 mg | Freq: Once | SUBCUTANEOUS | Status: AC
  Administered 2014-09-19: 60 mg via SUBCUTANEOUS

## 2014-10-11 ENCOUNTER — Telehealth: Payer: Self-pay | Admitting: Internal Medicine

## 2014-10-11 NOTE — Telephone Encounter (Signed)
Patient informed and states she will call Dr Hochrein's office.

## 2014-10-11 NOTE — Telephone Encounter (Signed)
On ROC looks like due for f/u with her cardiologist anyways. Would advise she have them eval at her appt.

## 2014-10-11 NOTE — Telephone Encounter (Signed)
Home health NP detected a right carotid bruit. Pt is a symptomatic but NP wanted you to know.

## 2014-10-17 ENCOUNTER — Telehealth: Payer: Self-pay | Admitting: *Deleted

## 2014-10-17 NOTE — Telephone Encounter (Signed)
Dr Kim--FYI-Patient called and left a message stating she has an appt with Dr Antoine PocheHochrein on 4/14 and will discuss the finding by the PT (carotid bruit).

## 2014-11-16 ENCOUNTER — Ambulatory Visit (INDEPENDENT_AMBULATORY_CARE_PROVIDER_SITE_OTHER): Payer: Medicare Other | Admitting: Cardiology

## 2014-11-16 ENCOUNTER — Encounter: Payer: Self-pay | Admitting: Cardiology

## 2014-11-16 VITALS — BP 154/80 | HR 67 | Ht 64.0 in | Wt 131.6 lb

## 2014-11-16 DIAGNOSIS — I10 Essential (primary) hypertension: Secondary | ICD-10-CM | POA: Diagnosis not present

## 2014-11-16 DIAGNOSIS — R0989 Other specified symptoms and signs involving the circulatory and respiratory systems: Secondary | ICD-10-CM

## 2014-11-16 NOTE — Patient Instructions (Signed)
Your physician wants you to follow-up in: 1 Year You will receive a reminder letter in the mail two months in advance. If you don't receive a letter, please call our office to schedule the follow-up appointment.  Your physician has requested that you have a carotid duplex. This test is an ultrasound of the carotid arteries in your neck. It looks at blood flow through these arteries that supply the brain with blood. Allow one hour for this exam. There are no restrictions or special instructions.    

## 2014-11-16 NOTE — Progress Notes (Signed)
HPI The patient presents for followup up of CAD.  Since last saw her she has had no cardiovascular symptoms. She did go golfing yesterday 18 holes. She walks about 7 of those. She has none of the symptoms that she had previously. She denies any chest pressure, neck or arm discomfort. She's had no palpitations, presyncope or syncope. She has no PND or orthopnea. Of note she was told by a home health nurse recently that she had a right carotid bruit.   Allergies  Allergen Reactions  . Pravastatin Sodium Other (See Comments)    cystitis  . Zocor [Simvastatin] Other (See Comments)    cystitis    Current Outpatient Prescriptions  Medication Sig Dispense Refill  . aspirin 81 MG tablet Take 81 mg by mouth daily.      . Calcium Carbonate-Vitamin D (CALCIUM + D PO) Take by mouth daily.    Marland Kitchen. denosumab (PROLIA) 60 MG/ML SOLN injection Inject 60 mg into the skin every 6 (six) months. Administer in upper arm, thigh, or abdomen    . HYDROcodone-acetaminophen (NORCO) 5-325 MG per tablet Take 1 tablet by mouth every 6 (six) hours as needed. 16 tablet 0  . Multiple Vitamins-Calcium (ONE-A-DAY WOMENS PO) Take 1 tablet by mouth daily.     . Multiple Vitamins-Minerals (EYE VITAMINS PO) Take by mouth daily.    . Omega-3 Fatty Acids (OMEGA 3 PO) Take 2 capsules by mouth daily.     . rosuvastatin (CRESTOR) 10 MG tablet Take 0.5 tablets (5 mg total) by mouth daily. 30 tablet 6  . SYNTHROID 50 MCG tablet TAKE 1 TABLET EACH DAY. 90 tablet 1  . triamcinolone cream (KENALOG) 0.1 % Apply 1 application topically 2 (two) times daily. 30 g 0   No current facility-administered medications for this visit.    Past Medical History  Diagnosis Date  . CAD (coronary artery disease)     LAD stenting of a 90% lesion 2000 the left  . Cystocele   . Rectocele   . Urinary incontinence     USI  . Osteoporosis   . Uterine prolapse   . Elevated cholesterol   . Thyroid disease     Hypothyroid  . Scoliosis   . Arthritis      Past Surgical History  Procedure Laterality Date  . Vaginal hysterectomy  2011    LAVH BSO  . Oophorectomy  2011    BSO  . Bladder suspension  2011  . Coronary angioplasty with stent placement  2012    ROS:  As stated in the HPI and negative for all other systems.  PHYSICAL EXAM BP 154/80 mmHg  Pulse 67  Ht 5\' 4"  (1.626 m)  Wt 131 lb 9.6 oz (59.693 kg)  BMI 22.58 kg/m2 GENERAL:  Well appearing NECK:  No jugular venous distention, waveform within normal limits, carotid upstroke brisk and symmetric, right bruits, no thyromegaly LUNGS:  Clear to auscultation bilaterally BACK:  No CVA tenderness CHEST:  Unremarkable HEART:  PMI not displaced or sustained,S1 and S2 within normal limits, no S3, no S4, no clicks, no rubs, soft apical early systolic murmur nonradiating, no diastolicmurmurs ABD:  Flat, positive bowel sounds normal in frequency in pitch, no bruits, no rebound, no guarding, no midline pulsatile mass, no hepatomegaly, no splenomegaly EXT:  2 plus pulses throughout, no edema, no cyanosis no clubbing  EKG:  Sinus rhythm, rate 67, no acute ST-T wave changes.  11/16/2014  ASSESSMENT AND PLAN   CAD, NATIVE VESSEL -  The patient has had no new symptoms. She has had no symptoms since her last stress test. No further cardiovascular testing is suggested.    HYPERTENSION -  The blood pressure is slightly elevated today but I reviewed many previous readings and this is unusual.. No change in medications is indicated. We will continue with therapeutic lifestyle changes (TLC).  Hyperlipidemia - He tolerates a low dose of Crestor. Her LDL is 87 with an HDL of 55.1 in January. She will remain on the meds as listed.  BRUIT - I will check a carotid Doppler.

## 2014-12-12 ENCOUNTER — Encounter (HOSPITAL_COMMUNITY): Payer: Medicare Other

## 2014-12-12 ENCOUNTER — Ambulatory Visit (HOSPITAL_COMMUNITY)
Admission: RE | Admit: 2014-12-12 | Discharge: 2014-12-12 | Disposition: A | Discharge status: Home / Self Care | Payer: Medicare Other | Source: Ambulatory Visit | Attending: Cardiology | Admitting: Cardiology

## 2014-12-12 ENCOUNTER — Other Ambulatory Visit (HOSPITAL_COMMUNITY): Payer: Self-pay | Admitting: Specialist

## 2014-12-12 DIAGNOSIS — M79661 Pain in right lower leg: Secondary | ICD-10-CM

## 2014-12-12 DIAGNOSIS — M7989 Other specified soft tissue disorders: Principal | ICD-10-CM

## 2014-12-12 DIAGNOSIS — R0989 Other specified symptoms and signs involving the circulatory and respiratory systems: Secondary | ICD-10-CM | POA: Insufficient documentation

## 2014-12-19 ENCOUNTER — Telehealth: Payer: Self-pay | Admitting: Cardiology

## 2014-12-19 NOTE — Telephone Encounter (Signed)
Pt would like her test results from 12-12-14 please.

## 2015-01-01 ENCOUNTER — Telehealth: Payer: Self-pay | Admitting: Cardiology

## 2015-01-01 NOTE — Telephone Encounter (Signed)
Mrs. Kaitlyn Parks is calling to get her results of her carotoid doppler in which she had on May 16,2016 or there about. Please call  Thanks

## 2015-02-11 DIAGNOSIS — N289 Disorder of kidney and ureter, unspecified: Secondary | ICD-10-CM | POA: Insufficient documentation

## 2015-02-11 DIAGNOSIS — I2510 Atherosclerotic heart disease of native coronary artery without angina pectoris: Secondary | ICD-10-CM | POA: Insufficient documentation

## 2015-02-11 DIAGNOSIS — Z9861 Coronary angioplasty status: Secondary | ICD-10-CM

## 2015-02-12 DIAGNOSIS — J029 Acute pharyngitis, unspecified: Secondary | ICD-10-CM | POA: Insufficient documentation

## 2015-02-12 DIAGNOSIS — R300 Dysuria: Secondary | ICD-10-CM | POA: Insufficient documentation

## 2015-03-04 ENCOUNTER — Encounter: Payer: Self-pay | Admitting: Family Medicine

## 2015-03-04 ENCOUNTER — Ambulatory Visit (INDEPENDENT_AMBULATORY_CARE_PROVIDER_SITE_OTHER): Payer: Medicare Other | Admitting: Family Medicine

## 2015-03-04 VITALS — BP 130/80 | HR 66 | Temp 97.6°F | Ht 64.0 in | Wt 130.5 lb

## 2015-03-04 DIAGNOSIS — K219 Gastro-esophageal reflux disease without esophagitis: Secondary | ICD-10-CM | POA: Diagnosis not present

## 2015-03-04 DIAGNOSIS — E038 Other specified hypothyroidism: Secondary | ICD-10-CM

## 2015-03-04 DIAGNOSIS — I2510 Atherosclerotic heart disease of native coronary artery without angina pectoris: Secondary | ICD-10-CM | POA: Diagnosis not present

## 2015-03-04 DIAGNOSIS — E782 Mixed hyperlipidemia: Secondary | ICD-10-CM

## 2015-03-04 DIAGNOSIS — M810 Age-related osteoporosis without current pathological fracture: Secondary | ICD-10-CM

## 2015-03-04 NOTE — Patient Instructions (Addendum)
BEFORE YOU LEAVE: -Ronnald Collum, please obtain records from outside hospital and return pt papers to patient - Schedule your annual Medicare wellness visit in 6 months  Please follow up with Dr. Elvera Lennox in February in 2017  Please follow up with your cardiologist if any chest pain, shortness of breath or heart concerns  We recommend the following healthy lifestyle measures: - eat a healthy diet consisting of lots of vegetables, fruits, beans, nuts, seeds, healthy meats such as white chicken and fish and whole grains.  - avoid fried foods, fast food, processed foods, sodas, red meet and other fattening foods.  - get a least 150 minutes of aerobic exercise per week.    Food Choices for Gastroesophageal Reflux Disease When you have gastroesophageal reflux disease (GERD), the foods you eat and your eating habits are very important. Choosing the right foods can help ease the discomfort of GERD. WHAT GENERAL GUIDELINES DO I NEED TO FOLLOW?  Choose fruits, vegetables, whole grains, low-fat dairy products, and low-fat meat, fish, and poultry.  Limit fats such as oils, salad dressings, butter, nuts, and avocado.  Keep a food diary to identify foods that cause symptoms.  Avoid foods that cause reflux. These may be different for different people.  Eat frequent small meals instead of three large meals each day.  Eat your meals slowly, in a relaxed setting.  Limit fried foods.  Cook foods using methods other than frying.  Avoid drinking alcohol.  Avoid drinking large amounts of liquids with your meals.  Avoid bending over or lying down until 2-3 hours after eating. WHAT FOODS ARE NOT RECOMMENDED? The following are some foods and drinks that may worsen your symptoms: Vegetables Tomatoes. Tomato juice. Tomato and spaghetti sauce. Chili peppers. Onion and garlic. Horseradish. Fruits Oranges, grapefruit, and lemon (fruit and juice). Meats High-fat meats, fish, and poultry. This includes hot  dogs, ribs, ham, sausage, salami, and bacon. Dairy Whole milk and chocolate milk. Sour cream. Cream. Butter. Ice cream. Cream cheese.  Beverages Coffee and tea, with or without caffeine. Carbonated beverages or energy drinks. Condiments Hot sauce. Barbecue sauce.  Sweets/Desserts Chocolate and cocoa. Donuts. Peppermint and spearmint. Fats and Oils High-fat foods, including [place] fries and potato chips. Other Vinegar. Strong spices, such as black pepper, white pepper, red pepper, cayenne, curry powder, cloves, ginger, and chili powder. The items listed above may not be a complete list of foods and beverages to avoid. Contact your dietitian for more information. Document Released: 07/20/2005 Document Revised: 07/25/2013 Document Reviewed: 05/24/2013 [facility] Patient Information 2015 [place], [place]. This information is not intended to replace advice given to you by your health care provider. Make sure you discuss any questions you have with your health care provider.

## 2015-03-04 NOTE — Progress Notes (Signed)
HPI:  Kaitlyn Parks is here to establish care.  HM: -colon: repors she had, done in 2007 and told follow up prn - not indicated  Has the following chronic problems that require follow up and concerns today:  CAD/HLD: -s/p stent in LAD 2012 -sees Dr. Antoine Poche yearly -meds: asa, crestor 10mg , fish oil -denies: CP, DOE, SOB, swelling, palpitations -no chest pain or symptoms with exercise (water aerobics)  Osteoporosis: -sees Dr. Elvera Lennox -on prolia  Hypothyroidism: -takes synthroid daily -reports stable for some time -reports checks yearly  GERD: -had eval in OSH for UTI and CP on July 11th, 2016 at [facility] - has cardiac eval and stress test and was told was normal -taking prevacid daily for 6 weeks -denies: weight loss, abd pain, nausea, vomiting since -does have occ symptoms when lying down  Dysuria: -sees Dr. Burby Singer in urology for this  ROS negative for unless reported above: fevers, unintentional weight loss, hearing or vision loss, chest pain, palpitations, struggling to breath, hemoptysis, melena, hematochezia, hematuria, falls, loc, si, thoughts of self harm  Past Medical History  Diagnosis Date  . CAD (coronary artery disease)     LAD stenting of a 90% lesion 2012  . Cystocele   . Rectocele   . Urinary incontinence     USI  . Osteoporosis   . Uterine prolapse   . Elevated cholesterol   . Thyroid disease     Hypothyroid  . Scoliosis   . Arthritis     DDD, scoliosis, sees Dr. Lovell Sheehan for this, uses norco very rarely for pain  . GERD (gastroesophageal reflux disease)     treated at ER while out of town for chest pain and diagnosed with GERD  . Interstitial cystitis     sees Dr. [name]  . GERD (gastroesophageal reflux disease)     Past Surgical History  Procedure Laterality Date  . Vaginal hysterectomy  2011    LAVH BSO  . Oophorectomy  2011    BSO  . Bladder suspension  2011  . Coronary angioplasty with stent placement   2012    Family History  Problem Relation Age of Onset  . Hypertension Mother   . Heart disease Mother   . Heart disease Father   . Heart disease Sister   . Colon cancer Paternal Aunt 44  . Breast cancer Paternal Aunt     Age 34's  . Diabetes Sister   . Breast cancer Cousin     Maternal 1st cousins-Age 62's  . Leukemia Paternal Aunt     History   Social History  . Marital Status: Married    Spouse Name: N/A  . Number of Children: N/A  . Years of Education: N/A   Social History Main Topics  . Smoking status: Never Smoker   . Smokeless tobacco: Never Used  . Alcohol Use: 0.0 oz/week     Comment: Rare  . Drug Use: No  . Sexual Activity: No   Other Topics Concern  . None   Social History Narrative   Work or School:  none      Home Situation: lives with husband      Spiritual Beliefs: [employer]      Lifestyle: regular exercise (yoga, water aerobics); diet is healthy           Current outpatient prescriptions:  .  aspirin 81 MG tablet, Take 81 mg by mouth daily.  , Disp: , Rfl:  .  Calcium Carbonate-Vitamin  D (CALCIUM + D PO), Take by mouth daily., Disp: , Rfl:  .  denosumab (PROLIA) 60 MG/ML SOLN injection, Inject 60 mg into the skin every 6 (six) months. Administer in upper arm, thigh, or abdomen, Disp: , Rfl:  .  HYDROcodone-acetaminophen (NORCO) 5-325 MG per tablet, Take 1 tablet by mouth every 6 (six) hours as needed., Disp: 16 tablet, Rfl: 0 .  lansoprazole (PREVACID) 30 MG capsule, Take 30 mg by mouth daily. , Disp: , Rfl:  .  Multiple Vitamins-Calcium (ONE-A-DAY WOMENS PO), Take 1 tablet by mouth daily. , Disp: , Rfl:  .  Multiple Vitamins-Minerals (EYE VITAMINS PO), Take by mouth daily., Disp: , Rfl:  .  Omega-3 Fatty Acids (OMEGA 3 PO), Take 2 capsules by mouth daily. , Disp: , Rfl:  .  rosuvastatin (CRESTOR) 10 MG tablet, Take 0.5 tablets (5 mg total) by mouth daily., Disp: 30 tablet, Rfl: 6 .  SYNTHROID 50 MCG tablet, TAKE 1 TABLET EACH DAY., Disp: 90  tablet, Rfl: 1 .  triamcinolone cream (KENALOG) 0.1 %, Apply 1 application topically 2 (two) times daily., Disp: 30 g, Rfl: 0  EXAM:  Filed Vitals:   03/04/15 1113  BP: 130/80  Pulse: 66  Temp: 97.6 F (36.4 C)    Body mass index is 22.39 kg/(m^2).  GENERAL: vitals reviewed and listed above, alert, oriented, appears well hydrated and in no acute distress  HEENT: atraumatic, conjunttiva clear, no obvious abnormalities on inspection of external nose and ears  NECK: no obvious masses on inspection  LUNGS: clear to auscultation bilaterally, no wheezes, rales or rhonchi, good air movement  CV: HRRR, no peripheral edema  MS: moves all extremities without noticeable abnormality  PSYCH: pleasant and cooperative, no obvious depression or anxiety  ASSESSMENT AND PLAN:  Discussed the following assessment and plan:  Gastroesophageal reflux disease, esophagitis presence not specified -cont full dose PPI for 2 more weeks, then OTC PPI for 2 weeks, dietary changes, follow up if symptoms persist -advised assistant to obtain records from OSH  Other specified hypothyroidism -stable, she declined recheck today, wants to do at physical  Mixed hyperlipidemia -stable  Atherosclerosis of native coronary artery of native heart without angina pectoris -sees cardiologist, no symptoms  Osteoporosis -sees Dr. Elvera Lennox  -We reviewed the PMH, PSH, FH, SH, Meds and Allergies. -We provided refills for any medications we will prescribe as needed. -We addressed current concerns per orders and patient instructions. -We have asked for records for pertinent exams, studies, vaccines and notes from previous providers. -We have advised patient to follow up per instructions below.   -Patient advised to return or notify a doctor immediately if symptoms worsen or persist or new concerns arise.  Patient Instructions  BEFORE YOU LEAVE: -Ronnald Collum, please obtain records from outside hospital and return pt  papers to patient - Schedule your annual Medicare wellness visit in 6 months  Please follow up with Dr. Elvera Lennox in February in 2017  Please follow up with your cardiologist if any chest pain, shortness of breath or heart concerns  We recommend the following healthy lifestyle measures: - eat a healthy diet consisting of lots of vegetables, fruits, beans, nuts, seeds, healthy meats such as white chicken and fish and whole grains.  - avoid fried foods, fast food, processed foods, sodas, red meet and other fattening foods.  - get a least 150 minutes of aerobic exercise per week.    Food Choices for Gastroesophageal Reflux Disease When you have gastroesophageal reflux disease (GERD),  the foods you eat and your eating habits are very important. Choosing the right foods can help ease the discomfort of GERD. WHAT GENERAL GUIDELINES DO I NEED TO FOLLOW?  Choose fruits, vegetables, whole grains, low-fat dairy products, and low-fat meat, fish, and poultry.  Limit fats such as oils, salad dressings, butter, nuts, and avocado.  Keep a food diary to identify foods that cause symptoms.  Avoid foods that cause reflux. These may be different for different people.  Eat frequent small meals instead of three large meals each day.  Eat your meals slowly, in a relaxed setting.  Limit fried foods.  Cook foods using methods other than frying.  Avoid drinking alcohol.  Avoid drinking large amounts of liquids with your meals.  Avoid bending over or lying down until 2-3 hours after eating. WHAT FOODS ARE NOT RECOMMENDED? The following are some foods and drinks that may worsen your symptoms: Vegetables Tomatoes. Tomato juice. Tomato and spaghetti sauce. Chili peppers. Onion and garlic. Horseradish. Fruits Oranges, grapefruit, and lemon (fruit and juice). Meats High-fat meats, fish, and poultry. This includes hot dogs, ribs, ham, sausage, salami, and bacon. Dairy Whole milk and chocolate milk. Sour  cream. Cream. Butter. Ice cream. Cream cheese.  Beverages Coffee and tea, with or without caffeine. Carbonated beverages or energy drinks. Condiments Hot sauce. Barbecue sauce.  Sweets/Desserts Chocolate and cocoa. Donuts. Peppermint and spearmint. Fats and Oils High-fat foods, including [place] fries and potato chips. Other Vinegar. Strong spices, such as black pepper, white pepper, red pepper, cayenne, curry powder, cloves, ginger, and chili powder. The items listed above may not be a complete list of foods and beverages to avoid. Contact your dietitian for more information. Document Released: 07/20/2005 Document Revised: 07/25/2013 Document Reviewed: 05/24/2013 [facility] Patient Information 2015 [place], [place]. This information is not intended to replace advice given to you by your health care provider. Make sure you discuss any questions you have with your health care provider.      Kriste Basque R.

## 2015-03-04 NOTE — Progress Notes (Signed)
Pre visit review using our clinic review tool, if applicable. No additional management support is needed unless otherwise documented below in the visit note. 

## 2015-03-13 ENCOUNTER — Other Ambulatory Visit: Payer: Self-pay | Admitting: Family Medicine

## 2015-04-02 ENCOUNTER — Other Ambulatory Visit: Payer: Self-pay | Admitting: Cardiology

## 2015-04-05 ENCOUNTER — Telehealth: Payer: Self-pay | Admitting: *Deleted

## 2015-04-05 ENCOUNTER — Other Ambulatory Visit: Payer: Self-pay | Admitting: *Deleted

## 2015-04-05 NOTE — Telephone Encounter (Signed)
It's time for pt's Prolia Inj. Can you please do a PA for her? Thank you.

## 2015-04-09 NOTE — Telephone Encounter (Signed)
I have electronically submitted pt's info for Prolia insurance verification and will notify you once I have a response. Thank you. °

## 2015-04-15 NOTE — Telephone Encounter (Signed)
I have rec'd pt's insurance verification for Prolia.  Whether an OV is billed or not pt will have an estimated responsibility of a $25 co-pay which will cover Prolia, the admin, and OV (if billed).  Please make pt aware this is an estimate and we will not know and exact amt until insurances have paid.  I have sent a copy of the summary of benefits to be scanned into pt's chart.  If you have any questions, please let me know.   Thank you.

## 2015-04-18 ENCOUNTER — Telehealth: Payer: Self-pay | Admitting: *Deleted

## 2015-04-18 NOTE — Telephone Encounter (Signed)
Called pt and lvm advising her to that the approval for her Prolia inj came in and her copay is $25. Advised her to call and make an appt.

## 2015-04-24 ENCOUNTER — Telehealth: Payer: Self-pay | Admitting: Internal Medicine

## 2015-04-24 NOTE — Telephone Encounter (Signed)
Patient is calling to schedule her Prolia shot

## 2015-04-26 NOTE — Telephone Encounter (Signed)
Patient is returning your call.  

## 2015-04-26 NOTE — Telephone Encounter (Signed)
Returned pt's call. Scheduled Prolia inj for Wed, Sept 28th.

## 2015-05-01 ENCOUNTER — Other Ambulatory Visit (INDEPENDENT_AMBULATORY_CARE_PROVIDER_SITE_OTHER): Payer: Medicare Other | Admitting: *Deleted

## 2015-05-01 ENCOUNTER — Ambulatory Visit (INDEPENDENT_AMBULATORY_CARE_PROVIDER_SITE_OTHER): Payer: Medicare Other | Admitting: *Deleted

## 2015-05-01 DIAGNOSIS — M810 Age-related osteoporosis without current pathological fracture: Secondary | ICD-10-CM

## 2015-05-01 MED ORDER — DENOSUMAB 60 MG/ML ~~LOC~~ SOLN
60.0000 mg | Freq: Once | SUBCUTANEOUS | Status: AC
  Administered 2015-05-01: 60 mg via SUBCUTANEOUS

## 2015-08-06 LAB — HM MAMMOGRAPHY

## 2015-08-29 ENCOUNTER — Encounter: Payer: Self-pay | Admitting: Family Medicine

## 2015-09-12 ENCOUNTER — Other Ambulatory Visit: Payer: Self-pay | Admitting: Family Medicine

## 2015-10-09 ENCOUNTER — Ambulatory Visit (INDEPENDENT_AMBULATORY_CARE_PROVIDER_SITE_OTHER): Payer: Medicare Other | Admitting: Family Medicine

## 2015-10-09 ENCOUNTER — Encounter: Payer: Self-pay | Admitting: Family Medicine

## 2015-10-09 VITALS — BP 100/72 | HR 79 | Temp 98.4°F | Ht 64.0 in | Wt 131.0 lb

## 2015-10-09 DIAGNOSIS — J329 Chronic sinusitis, unspecified: Secondary | ICD-10-CM | POA: Diagnosis not present

## 2015-10-09 MED ORDER — FLUTICASONE PROPIONATE 50 MCG/ACT NA SUSP: 2.0000 | Freq: Every day | NASAL | Status: DC

## 2015-10-09 NOTE — Progress Notes (Signed)
HPI:  Kaitlyn Parks is here for an acute visit. Sinus congestion: -started: 2 days ago -symptoms: Clear nasal congestion, bilateral maxillary sinus discomfort, sore throat, postnasal drip, sneezing, itchy eyes, cough -denies:fever, body aches, SOB, NVD, tooth pain -has tried: Took Claritin and Flonase once -sick contacts/travel/risks: no reported flu, strep or tick exposure -Hx of: allergies ROS: See pertinent positives and negatives per HPI.  Past Medical History  Diagnosis Date  . CAD (coronary artery disease)     LAD stenting of a 90% lesion 2012  . Cystocele   . Rectocele   . Urinary incontinence     USI  . Osteoporosis   . Uterine prolapse   . Elevated cholesterol   . Thyroid disease     Hypothyroid  . Scoliosis   . Arthritis     DDD, scoliosis, sees Dr. Lovell Sheehan for this, uses norco very rarely for pain  . Interstitial cystitis     sees Dr. [name]  . GERD (gastroesophageal reflux disease)     dx in work up 2016 for atypical CP at OSH    Past Surgical History  Procedure Laterality Date  . Vaginal hysterectomy  2011    LAVH BSO  . Oophorectomy  2011    BSO  . Bladder suspension  2011  . Coronary angioplasty with stent placement  2012    Family History  Problem Relation Age of Onset  . Hypertension Mother   . Heart disease Mother   . Heart disease Father   . Heart disease Sister   . Colon cancer Paternal Aunt 20  . Breast cancer Paternal Aunt     Age 37's  . Diabetes Sister   . Breast cancer Cousin     Maternal 1st cousins-Age 67's  . Leukemia Paternal Aunt     Social History   Social History  . Marital Status: Married    Spouse Name: N/A  . Number of Children: N/A  . Years of Education: N/A   Social History Main Topics  . Smoking status: Never Smoker   . Smokeless tobacco: Never Used  . Alcohol Use: 0.0 oz/week     Comment: Rare  . Drug Use: No  . Sexual Activity: No   Other Topics Concern  . None   Social History Narrative   Work  or School:  none      Home Situation: lives with husband      Spiritual Beliefs: [employer]      Lifestyle: regular exercise (yoga, water aerobics); diet is healthy           Current outpatient prescriptions:  .  aspirin 81 MG tablet, Take 81 mg by mouth daily.  , Disp: , Rfl:  .  Calcium Carbonate-Vitamin D (CALCIUM + D PO), Take by mouth daily., Disp: , Rfl:  .  denosumab (PROLIA) 60 MG/ML SOLN injection, Inject 60 mg into the skin every 6 (six) months. Administer in upper arm, thigh, or abdomen, Disp: , Rfl:  .  HYDROcodone-acetaminophen (NORCO) 5-325 MG per tablet, Take 1 tablet by mouth every 6 (six) hours as needed., Disp: 16 tablet, Rfl: 0 .  lansoprazole (PREVACID) 30 MG capsule, Take 30 mg by mouth daily. , Disp: , Rfl:  .  Multiple Vitamins-Calcium (ONE-A-DAY WOMENS PO), Take 1 tablet by mouth daily. , Disp: , Rfl:  .  Multiple Vitamins-Minerals (EYE VITAMINS PO), Take by mouth daily., Disp: , Rfl:  .  Omega-3 Fatty Acids (OMEGA 3 PO), Take 2 capsules by  mouth daily. , Disp: , Rfl:  .  rosuvastatin (CRESTOR) 10 MG tablet, Take 0.5 tablets (5 mg total) by mouth daily., Disp: 30 tablet, Rfl: 0 .  SYNTHROID 50 MCG tablet, TAKE 1 TABLET EACH DAY., Disp: 90 tablet, Rfl: 1 .  triamcinolone cream (KENALOG) 0.1 %, Apply 1 application topically 2 (two) times daily., Disp: 30 g, Rfl: 0 .  fluticasone (FLONASE) 50 MCG/ACT nasal spray, Place 2 sprays into both nostrils daily., Disp: 16 g, Rfl: 6  EXAM:  Filed Vitals:   10/09/15 1115  BP: 100/72  Pulse: 79  Temp: 98.4 F (36.9 C)    Body mass index is 22.47 kg/(m^2).  GENERAL: vitals reviewed and listed above, alert, oriented, appears well hydrated and in no acute distress  HEENT: atraumatic, conjunttiva clear, no obvious abnormalities on inspection of external nose and ears, normal appearance of ear canals and TMs, clear nasal congestion, mild post oropharyngeal erythema with PND, no tonsillar edema or exudate, no sinus  TTP  NECK: no obvious masses on inspection  LUNGS: clear to auscultation bilaterally, no wheezes, rales or rhonchi, good air movement  CV: HRRR, no peripheral edema  MS: moves all extremities without noticeable abnormality  PSYCH: pleasant and cooperative, no obvious depression or anxiety  ASSESSMENT AND PLAN:  Discussed the following assessment and plan:  Rhinosinusitis  -given HPI and exam findings today, a serious infection or illness is unlikely. We discussed potential etiologies, with VURI for allergic rhinitis being most likely, and advised supportive care, antihistamine, INS, short course nasal decongestant and monitoring. We discussed treatment side effects, likely course, antibiotic misuse, transmission, and signs of developing a serious illness. -of course, we advised to return or notify a doctor immediately if symptoms worsen or persist or new concerns arise.    Patient Instructions  Please start Afrin nasal spray twice daily for 5 days. Then stop. Can get this medication over-the-counter.  Take Claritin once daily.  Use Flonase 2 sprays each nostril daily for 21 days.  May use Tylenol or ibuprofen as needed. Do not take more than instructed.  Follow-up if symptoms worsen, new concerns develop or you have persistent sinus symptoms in one week.     Kriste BasqueKIM, Marabella Popiel R.

## 2015-10-09 NOTE — Progress Notes (Signed)
Pre visit review using our clinic review tool, if applicable. No additional management support is needed unless otherwise documented below in the visit note. 

## 2015-10-09 NOTE — Patient Instructions (Signed)
Please start Afrin nasal spray twice daily for 5 days. Then stop. Can get this medication over-the-counter.  Take Claritin once daily.  Use Flonase 2 sprays each nostril daily for 21 days.  May use Tylenol or ibuprofen as needed. Do not take more than instructed.  Follow-up if symptoms worsen, new concerns develop or you have persistent sinus symptoms in one week.

## 2015-11-04 ENCOUNTER — Other Ambulatory Visit: Payer: Self-pay | Admitting: Cardiology

## 2015-11-05 ENCOUNTER — Telehealth: Payer: Self-pay | Admitting: *Deleted

## 2015-11-05 NOTE — Telephone Encounter (Signed)
Rose, can you initiate a PA for a Prolia inj? This is overdue, my fault. Please try to get this going as soon as you can. Thank you so much!! 

## 2015-11-11 NOTE — Telephone Encounter (Signed)
I have electronically submitted pt's info for Prolia insurance verification and will notify you once I have a response. Thank you. °

## 2015-11-19 NOTE — Telephone Encounter (Signed)
I have rec'd Kaitlyn Parks's Engineer, manufacturing systemsinsurance verification for Prolia.  Whether an OV is billed or not, she will have an estimated responsibility of $25.  Please make pt aware this is an estimate and we will not know an exact amt until insurance(s) has/have paid.  I have sent a copy of the summary of benefits to be scanned into pt's chart.    Once pt recs injection, please let me know actual injection date so I can update the Prolia portal.  If you have any questions, please let me know.

## 2015-11-20 NOTE — Telephone Encounter (Signed)
Called pt and advised her per Rose's note. Pt is scheduled for her Prolia inj on 11/27/15 at 10:00 am.

## 2015-11-27 ENCOUNTER — Other Ambulatory Visit (INDEPENDENT_AMBULATORY_CARE_PROVIDER_SITE_OTHER): Payer: Medicare Other | Admitting: *Deleted

## 2015-11-27 ENCOUNTER — Ambulatory Visit (INDEPENDENT_AMBULATORY_CARE_PROVIDER_SITE_OTHER): Payer: Medicare Other | Admitting: *Deleted

## 2015-11-27 DIAGNOSIS — M810 Age-related osteoporosis without current pathological fracture: Secondary | ICD-10-CM

## 2015-11-27 MED ORDER — DENOSUMAB 60 MG/ML ~~LOC~~ SOLN
60.0000 mg | Freq: Once | SUBCUTANEOUS | Status: AC
  Administered 2015-11-27: 60 mg via SUBCUTANEOUS

## 2015-12-03 ENCOUNTER — Encounter: Payer: Self-pay | Admitting: Cardiology

## 2015-12-03 ENCOUNTER — Ambulatory Visit (INDEPENDENT_AMBULATORY_CARE_PROVIDER_SITE_OTHER): Payer: Medicare Other | Admitting: Cardiology

## 2015-12-03 VITALS — BP 124/70 | HR 70 | Ht 64.0 in | Wt 131.4 lb

## 2015-12-03 DIAGNOSIS — E782 Mixed hyperlipidemia: Secondary | ICD-10-CM | POA: Diagnosis not present

## 2015-12-03 DIAGNOSIS — Z79899 Other long term (current) drug therapy: Secondary | ICD-10-CM

## 2015-12-03 DIAGNOSIS — I2510 Atherosclerotic heart disease of native coronary artery without angina pectoris: Secondary | ICD-10-CM | POA: Diagnosis not present

## 2015-12-03 NOTE — Patient Instructions (Signed)
Your physician wants you to follow-up in: 1 Year. You will receive a reminder letter in the mail two months in advance. If you don't receive a letter, please call our office to schedule the follow-up appointment.  

## 2015-12-03 NOTE — Addendum Note (Signed)
Addended by: Barrie DunkerHOMAS, Dymon Summerhill N on: 12/03/2015 05:16 PM   Modules accepted: Orders

## 2015-12-03 NOTE — Progress Notes (Signed)
HPI The patient presents for followup up of CAD.  Since last saw her she has had no cardiovascular symptoms. She did go golfing today 18 holes. She walks about 7 of those. She has none of the symptoms that she had previously. She denies any chest pressure, neck or arm discomfort. She's had no palpitations, presyncope or syncope. She has no PND or orthopnea. Of note she was in a hospital in [place] in the summer of last year.  Ultimately was found to have a urinary tract infection was ruled out for an MI. I was able to review these records. She had a stress echocardiogram which was unremarkable. Since then she has done well. She is bothered somewhat by hip pain.  Allergies  Allergen Reactions  . Pravastatin Sodium Other (See Comments)    cystitis  . Zocor [Simvastatin] Other (See Comments)    cystitis    Current Outpatient Prescriptions  Medication Sig Dispense Refill  . aspirin 81 MG tablet Take 81 mg by mouth daily.      . Calcium Carbonate-Vitamin D (CALCIUM + D PO) Take by mouth daily.    . CRESTOR 10 MG tablet TAKE (1/2) TABLET DAILY. 30 tablet 0  . denosumab (PROLIA) 60 MG/ML SOLN injection Inject 60 mg into the skin every 6 (six) months. Administer in upper arm, thigh, or abdomen    . fluticasone (FLONASE) 50 MCG/ACT nasal spray Place 2 sprays into both nostrils daily. 16 g 6  . HYDROcodone-acetaminophen (NORCO) 5-325 MG per tablet Take 1 tablet by mouth every 6 (six) hours as needed. 16 tablet 0  . lansoprazole (PREVACID) 30 MG capsule Take 30 mg by mouth daily.     . Multiple Vitamins-Calcium (ONE-A-DAY WOMENS PO) Take 1 tablet by mouth daily.     . Multiple Vitamins-Minerals (EYE VITAMINS PO) Take by mouth daily.    . Omega-3 Fatty Acids (OMEGA 3 PO) Take 2 capsules by mouth daily.     Marland Kitchen SYNTHROID 50 MCG tablet TAKE 1 TABLET EACH DAY. 90 tablet 1  . triamcinolone cream (KENALOG) 0.1 % Apply 1 application topically 2 (two) times daily. 30 g 0   No current facility-administered  medications for this visit.    Past Medical History  Diagnosis Date  . CAD (coronary artery disease)     LAD stenting of a 90% lesion 2012  . Cystocele   . Rectocele   . Urinary incontinence     USI  . Osteoporosis   . Uterine prolapse   . Elevated cholesterol   . Thyroid disease     Hypothyroid  . Scoliosis   . Arthritis     DDD, scoliosis, sees Dr. Lovell Sheehan for this, uses norco very rarely for pain  . Interstitial cystitis     sees Dr. [name]  . GERD (gastroesophageal reflux disease)     dx in work up 2016 for atypical CP at OSH    Past Surgical History  Procedure Laterality Date  . Vaginal hysterectomy  2011    LAVH BSO  . Oophorectomy  2011    BSO  . Bladder suspension  2011  . Coronary angioplasty with stent placement  2012    ROS:  As stated in the HPI and negative for all other systems.  PHYSICAL EXAM BP 124/70 mmHg  Pulse 70  Ht  (1.626 m)  Wt 131 lb 6 oz (59.591 kg)  BMI 22.54 kg/m2 GENERAL:  Well appearing NECK:  No jugular venous distention, waveform within normal  limits, carotid upstroke brisk and symmetric, right bruits, no thyromegaly LUNGS:  Clear to auscultation bilaterally BACK:  No CVA tenderness CHEST:  Unremarkable HEART:  PMI not displaced or sustained,S1 and S2 within normal limits, no S3, no S4, no clicks, no rubs, soft apical early systolic murmur nonradiating, no diastolicmurmurs ABD:  Flat, positive bowel sounds normal in frequency in pitch, no bruits, no rebound, no guarding, no midline pulsatile mass, no hepatomegaly, no splenomegaly EXT:  2 plus pulses throughout, no edema, no cyanosis no clubbing  EKG:  Sinus rhythm, rate 74, no acute ST-T wave changes.  12/03/2015  ASSESSMENT AND PLAN   CAD, NATIVE VESSEL -  The patient has had no new symptoms. She has had no symptoms since [place] stress [place]. No further cardiovascular testing is suggested.    [facility] Records reviewed)  HYPERTENSION -   The blood pressure is at target today.  No change in therapy is indicated.   Hyperlipidemia - I will check a fasting lipid profile and liver enzymes.  She has not had one since last year.

## 2015-12-04 ENCOUNTER — Telehealth: Payer: Self-pay | Admitting: Cardiology

## 2015-12-04 NOTE — Telephone Encounter (Signed)
Left message - that a message was not left in regards to a call to her.left message that if the person needed her they will call back

## 2015-12-04 NOTE — Telephone Encounter (Signed)
New message    Pt states she was in office and she [facility]   She is returning call

## 2015-12-14 LAB — HEPATIC FUNCTION PANEL
ALK PHOS: 44 U/L (ref 33–130)
ALT: 13 U/L (ref 6–29)
AST: 17 U/L (ref 10–35)
Albumin: 4.1 g/dL (ref 3.6–5.1)
Bilirubin, Direct: 0.1 mg/dL (ref <=0.2)
Indirect Bilirubin: 0.4 mg/dL (ref 0.2–1.2)
Total Bilirubin: 0.5 mg/dL (ref 0.2–1.2)
Total Protein: 6.3 g/dL (ref 6.1–8.1)

## 2015-12-14 LAB — LIPID PANEL
CHOL/HDL RATIO: 3.1 ratio (ref <=5.0)
Cholesterol: 170 mg/dL (ref 125–200)
HDL: 55 mg/dL (ref >=46)
LDL Cholesterol: 101 mg/dL (ref <130)
TRIGLYCERIDES: 72 mg/dL (ref <150)
VLDL: 14 mg/dL (ref <30)

## 2015-12-18 ENCOUNTER — Telehealth: Payer: Self-pay | Admitting: Cardiology

## 2015-12-18 NOTE — Telephone Encounter (Signed)
Will forward to Nya T CMA for Dr Antoine PocheHochrein so she can release comments in mychart

## 2015-12-18 NOTE — Telephone Encounter (Signed)
Done

## 2015-12-18 NOTE — Telephone Encounter (Signed)
New message      Returning a call to the nurse to get lab results;  However, pt is going to be in and out today.  Please send results to mychart and she will call if she has any questions

## 2016-01-02 ENCOUNTER — Ambulatory Visit (INDEPENDENT_AMBULATORY_CARE_PROVIDER_SITE_OTHER): Payer: Medicare Other | Admitting: Family Medicine

## 2016-01-02 ENCOUNTER — Encounter: Payer: Self-pay | Admitting: Family Medicine

## 2016-01-02 VITALS — BP 132/70 | HR 78 | Temp 97.7°F | Ht 64.0 in | Wt 129.9 lb

## 2016-01-02 DIAGNOSIS — M542 Cervicalgia: Secondary | ICD-10-CM

## 2016-01-02 MED ORDER — TIZANIDINE HCL 2 MG PO TABS: 2.0000 mg | ORAL_TABLET | Freq: Every evening | ORAL | Status: DC | PRN

## 2016-01-02 NOTE — Patient Instructions (Addendum)
BEFORE YOU LEAVE: -neck spasm exercises -schedule follow up in 4 weeks  Avoid yoga poses that over extend or over flex the neck or apply pressure or strain  Can use the muscle relaxer at night.  Heat for 15 minutes twice daily.  Tylenol 500-1000mg  up to 3 times daily for pain  Topical menthol (tiger balm, icy hot, etc) and capsaicin product   Do the exercises 4 days per week  Follow up sooner if needed

## 2016-01-02 NOTE — Progress Notes (Signed)
Pre visit review using our clinic review tool, if applicable. No additional management support is needed unless otherwise documented below in the visit note. 

## 2016-01-02 NOTE — Progress Notes (Signed)
HPI:  Acute visit for:  Neck pain: -started during new yoga movement with hyperextension 1 week ago -R neck and upper shoulder -now improving significantly -sharp and searing pain, constant, mild now -massage, aleve and expired hydrocodone helped, now not needing to take anything -denies: radiation to extremities, weakness, numbness, fevers, malaise  ROS: See pertinent positives and negatives per HPI.  Past Medical History  Diagnosis Date  . CAD (coronary artery disease)     LAD stenting of a 90% lesion 2012  . Cystocele   . Rectocele   . Urinary incontinence     USI  . Osteoporosis   . Uterine prolapse   . Elevated cholesterol   . Thyroid disease     Hypothyroid  . Scoliosis   . Arthritis     DDD, scoliosis, sees Dr. Lovell Parks for this, uses norco very rarely for pain  . Interstitial cystitis     sees Dr. Annabell Parks  . GERD (gastroesophageal reflux disease)     dx in work up 2016 for atypical CP at OSH    Past Surgical History  Procedure Laterality Date  . Vaginal hysterectomy  2011    LAVH BSO  . Oophorectomy  2011    BSO  . Bladder suspension  2011  . Coronary angioplasty with stent placement  2012    Family History  Problem Relation Age of Onset  . Hypertension Mother   . Heart disease Mother   . Heart disease Father   . Heart disease Sister   . Colon cancer Paternal Aunt 52  . Breast cancer Paternal Aunt     Age 28's  . Diabetes Sister   . Breast cancer Cousin     Maternal 1st cousins-Age 40's  . Leukemia Paternal Aunt     Social History   Social History  . Marital Status: Married    Spouse Name: N/A  . Number of Children: N/A  . Years of Education: N/A   Social History Main Topics  . Smoking status: Never Smoker   . Smokeless tobacco: Never Used  . Alcohol Use: 0.0 oz/week     Comment: Rare  . Drug Use: No  . Sexual Activity: No   Other Topics Concern  . None   Social History Narrative   Work or School:  none      Home Situation:  lives with husband      Spiritual Beliefs: [employer]      Lifestyle: regular exercise (yoga, water aerobics); diet is healthy           Current outpatient prescriptions:  .  aspirin 81 MG tablet, Take 81 mg by mouth daily.  , Disp: , Rfl:  .  Calcium Carbonate-Vitamin D (CALCIUM + D PO), Take by mouth daily., Disp: , Rfl:  .  CRESTOR 10 MG tablet, TAKE (1/2) TABLET DAILY., Disp: 30 tablet, Rfl: 0 .  denosumab (PROLIA) 60 MG/ML SOLN injection, Inject 60 mg into the skin every 6 (six) months. Administer in upper arm, thigh, or abdomen, Disp: , Rfl:  .  Multiple Vitamins-Calcium (ONE-A-DAY WOMENS PO), Take 1 tablet by mouth daily. , Disp: , Rfl:  .  Multiple Vitamins-Minerals (EYE VITAMINS PO), Take by mouth daily., Disp: , Rfl:  .  Omega-3 Fatty Acids (OMEGA 3 PO), Take 2 capsules by mouth daily. , Disp: , Rfl:  .  SYNTHROID 50 MCG tablet, TAKE 1 TABLET EACH DAY., Disp: 90 tablet, Rfl: 1 .  triamcinolone cream (KENALOG) 0.1 %, Apply 1 application  topically 2 (two) times daily., Disp: 30 g, Rfl: 0 .  tiZANidine (ZANAFLEX) 2 MG tablet, Take 1 tablet (2 mg total) by mouth at bedtime as needed for muscle spasms., Disp: 20 tablet, Rfl: 0  EXAM:  Filed Vitals:   01/02/16 0848  BP: 132/70  Pulse: 78  Temp: 97.7 F (36.5 C)    Body mass index is 22.29 kg/(m^2).  GENERAL: vitals reviewed and listed above, alert, oriented, appears well hydrated and in no acute distress  HEENT: atraumatic, conjunttiva clear, no obvious abnormalities on inspection of external nose and ears  NECK: no obvious masses on inspection  LUNGS: clear to auscultation bilaterally, no wheezes, rales or rhonchi, good air movement  CV: HRRR, no peripheral edema  MS: moves all extremities without noticeable abnormality; normal inspection of the neck, has scoliosis of the back, mild TTP in the R  Levator scapula, neg spurling, normal ROM head and neck, normal strength, DTRs and sensitivity to light touch in bilat upper  extremities, normal radial pulses  PSYCH: pleasant and cooperative, no obvious depression or anxiety  ASSESSMENT AND PLAN:  Discussed the following assessment and plan:  Neck pain - Plan: tiZANidine (ZANAFLEX) 2 MG tablet  -we discussed possible serious and likely etiologies, workup and treatment, treatment risks and return precautions - suspect muscle strain -after this discussion, Kaitlyn Parks opted for HEP, muscle relaxer, analgesics as needed, heat and follow up as needed -follow up advised in 4 weeks or sooner as needed -of course, we advised Kaitlyn Parks  to return or notify a doctor immediately if symptoms worsen or persist or new concerns arise.  .  -Patient advised to return or notify a doctor immediately if symptoms worsen or persist or new concerns arise.  Patient Instructions  BEFORE YOU LEAVE: -neck spasm exercises -schedule follow up in 4 weeks  Can use the muscle relaxer at night.  Heat for 15 minutes twice daily.  Tylenol 500-1000mg  up to 3 times daily for pain  Topical menthol (tiger balm, icy hot, etc) and capsaicin product   Do the exercises 4 days per week  Follow up sooner if needed       Kaitlyn Parks R.

## 2016-01-09 ENCOUNTER — Encounter: Payer: Self-pay | Admitting: Family Medicine

## 2016-01-24 ENCOUNTER — Other Ambulatory Visit: Payer: Self-pay | Admitting: *Deleted

## 2016-01-24 DIAGNOSIS — E785 Hyperlipidemia, unspecified: Secondary | ICD-10-CM

## 2016-01-24 DIAGNOSIS — Z79899 Other long term (current) drug therapy: Secondary | ICD-10-CM

## 2016-01-29 ENCOUNTER — Other Ambulatory Visit: Payer: Self-pay | Admitting: Cardiology

## 2016-01-29 LAB — HEPATIC FUNCTION PANEL
ALBUMIN: 4.1 g/dL (ref 3.6–5.1)
ALT: 16 U/L (ref 6–29)
AST: 18 U/L (ref 10–35)
Alkaline Phosphatase: 50 U/L (ref 33–130)
BILIRUBIN TOTAL: 0.4 mg/dL (ref 0.2–1.2)
Bilirubin, Direct: 0.1 mg/dL (ref <=0.2)
Indirect Bilirubin: 0.3 mg/dL (ref 0.2–1.2)
Total Protein: 6.4 g/dL (ref 6.1–8.1)

## 2016-01-29 LAB — LIPID PANEL
CHOL/HDL RATIO: 3 ratio (ref <=5.0)
Cholesterol: 167 mg/dL (ref 125–200)
HDL: 56 mg/dL (ref >=46)
LDL CALC: 91 mg/dL (ref <130)
Triglycerides: 102 mg/dL (ref <150)
VLDL: 20 mg/dL (ref <30)

## 2016-01-30 ENCOUNTER — Ambulatory Visit: Payer: Medicare Other | Admitting: Family Medicine

## 2016-02-01 ENCOUNTER — Other Ambulatory Visit: Payer: Self-pay | Admitting: Cardiology

## 2016-02-03 ENCOUNTER — Telehealth: Payer: Self-pay | Admitting: Cardiology

## 2016-02-03 NOTE — Telephone Encounter (Signed)
Pt already pick up nitroglycerin

## 2016-02-03 NOTE — Telephone Encounter (Signed)
spoke with pt, pt already pick up rx for nitro

## 2016-02-03 NOTE — Telephone Encounter (Signed)
Kaitlyn Parks is returning your call , Please call   Thanks

## 2016-02-10 ENCOUNTER — Other Ambulatory Visit: Payer: Self-pay | Admitting: Cardiology

## 2016-02-10 NOTE — Telephone Encounter (Signed)
REFILL 

## 2016-02-24 ENCOUNTER — Encounter: Payer: Self-pay | Admitting: Internal Medicine

## 2016-02-25 ENCOUNTER — Encounter: Payer: Self-pay | Admitting: Family Medicine

## 2016-02-25 ENCOUNTER — Ambulatory Visit (INDEPENDENT_AMBULATORY_CARE_PROVIDER_SITE_OTHER): Payer: Medicare Other | Admitting: Family Medicine

## 2016-02-25 VITALS — BP 128/62 | HR 55 | Temp 97.9°F | Ht 64.0 in | Wt 131.2 lb

## 2016-02-25 DIAGNOSIS — M5431 Sciatica, right side: Secondary | ICD-10-CM

## 2016-02-25 NOTE — Progress Notes (Signed)
Pre visit review using our clinic review tool, if applicable. No additional management support is needed unless otherwise documented below in the visit note. 

## 2016-02-25 NOTE — Patient Instructions (Signed)
BEFORE YOU LEAVE: -follow up: 1 month with me or your back specialist -sciatica exercises  Aleve per instructions as needed for pain  Home exercises 4 days per week  Follow up with me or your back specialist if not resolved over the next 2-4 weeks or sooner if any worsening.

## 2016-02-25 NOTE — Progress Notes (Signed)
HPI:   Kaitlyn Parks is a very pleasant 81 year old with a history of degenerative disc disease and scoliosis here for an acute visit for right low back pain with radicular symptoms. She reports she has a history of intermittent similar symptoms in the past, sees Dr. Lovell Sheehan for this. However she does not wish to have surgical or procedural types of interventions. This flare started about 2 weeks ago. The pain is moderate to severe at times, sharp, and is located in the right low back and with certain activities radiates down the right buttock and right lateral leg. She denies fevers, malaise, weakness, numbness, bowel or bladder dysfunction. She had some exercises at home and has been doing various yoga poses for this. Aleve helps. She also has taken a few hydrocodone that she had at home. She reports she has had significant improvement over the last few weeks. Still has mild symptoms. She went in to make sure that the exercises she is using are appropriate.   ROS: See pertinent positives and negatives per HPI.  Past Medical History:  Diagnosis Date  . Arthritis    DDD, scoliosis, sees Dr. Lovell Sheehan for this, uses norco very rarely for pain  . CAD (coronary artery disease)    LAD stenting of a 90% lesion 2012  . Cystocele   . Elevated cholesterol   . GERD (gastroesophageal reflux disease)    dx in work up 2016 for atypical CP at OSH  . Interstitial cystitis    sees Dr. [name]  . Osteoporosis   . Rectocele   . Scoliosis   . Thyroid disease    Hypothyroid  . Urinary incontinence    USI  . Uterine prolapse     Past Surgical History:  Procedure Laterality Date  . BLADDER SUSPENSION  2011  . CORONARY ANGIOPLASTY WITH STENT PLACEMENT  2012  . OOPHORECTOMY  2011   BSO  . VAGINAL HYSTERECTOMY  2011   LAVH BSO    Family History  Problem Relation Age of Onset  . Hypertension Mother   . Heart disease Mother   . Heart disease Father   . Heart disease Sister   . Colon cancer Paternal  Aunt 4  . Breast cancer Paternal Aunt     Age 15's  . Diabetes Sister   . Breast cancer Cousin     Maternal 1st cousins-Age 47's  . Leukemia Paternal Aunt     Social History   Social History  . Marital status: Married    Spouse name: N/A  . Number of children: N/A  . Years of education: N/A   Social History Main Topics  . Smoking status: Never Smoker  . Smokeless tobacco: Never Used  . Alcohol use 0.0 oz/week     Comment: Rare  . Drug use: No  . Sexual activity: No   Other Topics Concern  . None   Social History Narrative   Work or School:  none      Home Situation: lives with husband      Spiritual Beliefs: [employer]      Lifestyle: regular exercise (yoga, water aerobics); diet is healthy           Current Outpatient Prescriptions:  .  aspirin 81 MG tablet, Take 81 mg by mouth daily.  , Disp: , Rfl:  .  Calcium Carbonate-Vitamin D (CALCIUM + D PO), Take by mouth daily., Disp: , Rfl:  .  CRESTOR 10 MG tablet, TAKE (1/2) TABLET DAILY., Disp: 30 tablet,  Rfl: 0 .  denosumab (PROLIA) 60 MG/ML SOLN injection, Inject 60 mg into the skin every 6 (six) months. Administer in upper arm, thigh, or abdomen, Disp: , Rfl:  .  Multiple Vitamins-Calcium (ONE-A-DAY WOMENS PO), Take 1 tablet by mouth daily. , Disp: , Rfl:  .  Multiple Vitamins-Minerals (EYE VITAMINS PO), Take by mouth daily., Disp: , Rfl:  .  nitroGLYCERIN (NITROSTAT) 0.4 MG SL tablet, DISSOLVE 1 TABLET UNDER TONGUE IF NEEDED FOR CHEST PAIN. MAY REPEAT IN 5 MINUTES FOR 3 DOSES., Disp: 25 tablet, Rfl: 8 .  Omega-3 Fatty Acids (OMEGA 3 PO), Take 2 capsules by mouth daily. , Disp: , Rfl:  .  SYNTHROID 50 MCG tablet, TAKE 1 TABLET EACH DAY., Disp: 90 tablet, Rfl: 1 .  triamcinolone cream (KENALOG) 0.1 %, Apply 1 application topically 2 (two) times daily., Disp: 30 g, Rfl: 0  EXAM:  Vitals:   02/25/16 1646  BP: 128/62  Pulse: (!) 55  Temp: 97.9 F (36.6 C)    Body mass index is 22.52 kg/m.  GENERAL: vitals  reviewed and listed above, alert, oriented, appears well hydrated and in no acute distress  HEENT: atraumatic, conjunttiva clear, no obvious abnormalities on inspection of external nose and ears  NECK: no obvious masses on inspection  LUNGS: clear to auscultation bilaterally, no wheezes, rales or rhonchi, good air movement  CV: HRRR, no peripheral edema  MS: moves all extremities without noticeable abnormality Normal Gait Normal inspection of back, no obvious scoliosis or leg length descrepancy No bony TTP Soft tissue TTP at: R lower lumbar paraspinal muscles -/+ tests: neg trendelenburg,-facet loading, -SLRT, -CLRT, -FABER, -FADIR Normal muscle strength, sensation to light touch and DTRs in LEs bilaterally  PSYCH: pleasant and cooperative, no obvious depression or anxiety  ASSESSMENT AND PLAN:  Discussed the following assessment and plan:  Sciatica associated with disorder of lumbosacral spine, right  On ROC she has had plain films and MRI confirming significant degenerative disease of the lumbosacral spine. Sure symptoms are radicular in nature. She is not having any weakness or numbness and her neuro exam is negative for focal deficits. She is adamantly against procedural or surgical interventions. Given her symptoms are improving we opted to continue a more gentle home exercise program, provided today, and advised close follow-up to ensure further resolution of symptoms. She is to seek care immediately if she develops worsening of symptoms, weakness, numbness or bowel or bladder incontinence. Discussed obtaining a repeat MRI, however she would like to hold off on this for now. Also discussed oral prednisone, but she opted to hold on this ulcer since her symptoms are resolving. -Patient advised to return or notify a doctor immediately if symptoms worsen or persist or new concerns arise.  Patient Instructions  BEFORE YOU LEAVE: -follow up: 1 month with me or your back  specialist -sciatica exercises  Aleve per instructions as needed for pain  Home exercises 4 days per week  Follow up with me or your back specialist if not resolved over the next 2-4 weeks or sooner if any worsening.    Kriste Basque R., DO

## 2016-03-11 ENCOUNTER — Other Ambulatory Visit: Payer: Self-pay | Admitting: Family Medicine

## 2016-05-25 ENCOUNTER — Encounter: Payer: Self-pay | Admitting: Family Medicine

## 2016-05-29 ENCOUNTER — Other Ambulatory Visit: Payer: Self-pay | Admitting: Family Medicine

## 2016-06-01 ENCOUNTER — Other Ambulatory Visit: Payer: Self-pay | Admitting: Family Medicine

## 2016-06-15 ENCOUNTER — Encounter: Payer: Self-pay | Admitting: Internal Medicine

## 2016-06-19 ENCOUNTER — Other Ambulatory Visit: Payer: Self-pay | Admitting: Cardiology

## 2016-07-10 ENCOUNTER — Telehealth: Payer: Self-pay | Admitting: Internal Medicine

## 2016-07-10 NOTE — Telephone Encounter (Signed)
For Prolia

## 2016-07-10 NOTE — Telephone Encounter (Signed)
Pt's benefits have been checked there is a $25 copay. Pt is scheduled for 1:30 pm on 12/12

## 2016-07-14 ENCOUNTER — Ambulatory Visit (INDEPENDENT_AMBULATORY_CARE_PROVIDER_SITE_OTHER): Payer: Medicare Other

## 2016-07-14 DIAGNOSIS — M8000XA Age-related osteoporosis with current pathological fracture, unspecified site, initial encounter for fracture: Secondary | ICD-10-CM | POA: Diagnosis not present

## 2016-07-14 MED ORDER — DENOSUMAB 60 MG/ML ~~LOC~~ SOLN
60.0000 mg | Freq: Once | SUBCUTANEOUS | Status: AC
  Administered 2016-07-14: 60 mg via SUBCUTANEOUS

## 2016-08-11 ENCOUNTER — Ambulatory Visit (INDEPENDENT_AMBULATORY_CARE_PROVIDER_SITE_OTHER): Payer: Medicare Other

## 2016-08-11 ENCOUNTER — Ambulatory Visit: Payer: Medicare Other | Admitting: Internal Medicine

## 2016-08-11 VITALS — BP 140/80 | HR 96 | Ht 64.0 in | Wt 132.4 lb

## 2016-08-11 DIAGNOSIS — Z0000 Encounter for general adult medical examination without abnormal findings: Secondary | ICD-10-CM | POA: Diagnosis not present

## 2016-08-11 NOTE — Progress Notes (Addendum)
Subjective:   Kaitlyn Parks is a 82 y.o. female who presents for Medicare Annual (Subsequent) preventive examination.  The Patient was informed that the wellness visit is to identify future health risk and educate and initiate measures that can reduce risk for increased disease through the lifespan.    NO ROS; Medicare Wellness Visit Has scoliosis under treatment  Describes health as good, fair or great?  Good w the exception of recent sciatic which has slowed her down.   The following written screening schedule of preventive measures were reviewed with assessment and plan made per below and patient instructions:  Smoking history reviewed/ never smoked  Use of Smokeless tobacco no Second Hand Smoke status; No Smokers in the home   RISK FACTORS Regular exercise- educated regarding active lifestyle according to personal preferences/ hdl 55;  Recommendation per NIH reviewed and given options  Diet; incorporates fruits and vegetables; tries to eat healthy Barriers noted and given weight loss strategies as indicated   BMI WNL   Fall risk: presented mobile; get up and go WNL  Mobility of Functional changes this year? Some as she is not exercising as she did 6 months ago; Undecided in regard fup for sciatica   Safety at home and  Community reviewed; Plans to stay in the same home; 2 level [place] good friends around them   Depression Screen PhQ 2: negative  Activities of Daily Living - See functional screen   Cognitive testing; Ad8 score; 0 or less than 2  MMSE deferred or completed if AD8 + 2 issues  Advanced Directives reviewed for completion; discussion with MD as well as supportive resources as needed; has been completed by an attorney   Patient Care Team: Terressa Koyanagi, DO as PCP - General (Family Medicine) Rollene Rotunda, MD as Consulting Physician (Cardiology) Carlus Pavlov, MD as Consulting Physician (Internal Medicine) Tressie Stalker,  MD as Consulting Physician (Neurosurgery) Bjorn Pippin, MD as Attending Physician (Urology)    Preventives screens reviewed Colonoscopy / rec'd a letter from GI and will ask for the cologuard GI has outreached so will defer to GI  Mammogram Due; 08/2016; to schedule this soon Dexa/ osteoporosis; on prolia x 2 times per year; followed by endo Taking calcium and Vit d and exercise  Pap waived due to age  GYN; no  Pap waived    Immunization History  Administered Date(s) Administered  . Influenza Whole 08/11/2007  . Influenza, High Dose Seasonal PF 08/06/2014  . Influenza,inj,Quad PF,36+ Mos 04/24/2013  . Influenza-Unspecified 06/03/2016  . Pneumococcal Conjugate-13 02/19/2014  . Pneumococcal Polysaccharide-23 09/13/2007  . Td 11/04/1995, 09/13/2007   Required Immunizations needed today  Screening test up to date or reviewed for plan of completion Health Maintenance Due  Topic Date Due  . MAMMOGRAM  08/05/2016   Mammogram Due to be scheduled - the patient will fup   Cardiac Risk Factors include: advanced age (>81men, >68 women);dyslipidemia;family history of premature cardiovascular disease;hypertension     Objective:     Vitals: BP 140/80   Pulse 96   Ht 5\' 4"  (1.626 m)   Wt 132 lb 7 oz (60.1 kg)   SpO2 98%   BMI 22.73 kg/m   Body mass index is 22.73 kg/m.   Tobacco History  Smoking Status  . Never Smoker  Smokeless Tobacco  . Never Used     Counseling given: Yes   Past Medical History:  Diagnosis Date  . Arthritis  DDD, scoliosis, sees Dr. Lovell SheehanJenkins for this, uses norco very rarely for pain  . CAD (coronary artery disease)    LAD stenting of a 90% lesion 2012  . Cystocele   . Elevated cholesterol   . GERD (gastroesophageal reflux disease)    dx in work up 2016 for atypical CP at OSH  . Interstitial cystitis    sees Dr. Annabell HowellsWrenn  . Osteoporosis   . Rectocele   . Scoliosis   . Thyroid disease    Hypothyroid  . Urinary incontinence    USI  .  Uterine prolapse    Past Surgical History:  Procedure Laterality Date  . BLADDER SUSPENSION  2011  . CORONARY ANGIOPLASTY WITH STENT PLACEMENT  2012  . OOPHORECTOMY  2011   BSO  . VAGINAL HYSTERECTOMY  2011   LA[facility]H BSO   Family History  Problem Relation Age of Onset  . Hypertension Mother   . Heart disease Mother   . Heart disease Father   . Heart disease Sister   . Colon cancer Paternal Aunt [id number]  . Breast cancer Paternal Aunt     Age 80's  . Diabetes Sister   . Breast cancer Cousin     Maternal 1st cousins-Age 650's  . Leukemia Paternal Aunt    History  Sexual Activity  . Sexual activity: No    Outpatient Encounter Prescriptions as of 08/11/2016  Medication Sig  . aspirin 81 MG tablet Take 81 mg by mouth daily.    . Calcium Carbonate-Vitamin D (CALCIUM + D PO) Take by mouth daily.  . CRESTOR 10 MG tablet TAKE (1/2) TABLET DAILY.  Marland Kitchen. denosumab (PROLIA) 60 MG/ML SOLN injection Inject 60 mg into the skin every 6 (six) months. Administer in upper arm, thigh, or abdomen  . Multiple Vitamins-Calcium (ONE-A-DAY WOMENS PO) Take 1 tablet by mouth daily.   . Multiple Vitamins-Minerals (EYE VITAMINS PO) Take by mouth daily.  . nitroGLYCERIN (NITROSTAT) 0.4 MG SL tablet DISSOLVE 1 TABLET UNDER TONGUE IF NEEDED FOR CHEST PAIN. MAY REPEAT IN 5 MINUTES FOR 3 DOSES.  . Omega-3 Fatty Acids (OMEGA 3 PO) Take 2 capsules by mouth daily.   Marland Kitchen. SYNTHROID 50 MCG tablet TAKE 1 TABLET EACH DAY.  Marland Kitchen. triamcinolone cream (KENALOG) 0.1 % Apply 1 application topically 2 (two) times daily.   No facility-administered encounter medications on file as of 08/11/2016.     Activities of Daily Living In your present state of health, do you have any difficulty performing the following activities: 08/11/2016  Hearing? N  Vision? N  Difficulty concentrating or making decisions? (No Data)  Walking or climbing stairs? N  Dressing or bathing? N  Doing errands, shopping? N  Preparing Food and eating ? N  Using the  Toilet? N  In the past six months, have you accidently leaked urine? N  Do you have problems with loss of bowel control? N  Managing your Medications? N  Managing your Finances? N  Housekeeping or managing your Housekeeping? N  Some recent data might be hidden    Patient Care Team: Terressa KoyanagiHannah R Kim, DO as PCP - General (Family Medicine) Rollene RotundaJames Hochrein, MD as Consulting Physician (Cardiology) Carlus Pavlovristina Gherghe, MD as Consulting Physician (Internal Medicine) Tressie StalkerJeffrey Jenkins, MD as Consulting Physician (Neurosurgery) Bjorn PippinJohn Wrenn, MD as Attending Physician (Urology)    Assessment:   Exercise Activities and Dietary recommendations Current Exercise Habits: Home exercise routine, Type of exercise: walking, Time (Minutes): 45, Frequency (Times/Week): (S) 4, Weekly Exercise (Minutes/Week): 180, Intensity: Moderate  Goals    . Exercise 150 minutes per week (moderate activity)          Walk more consistently Will walk 1st thing in the am       Fall Risk Fall Risk  08/11/2016 11/17/2013  Falls in the past year? No No   Depression Screen PHQ 2/9 Scores 08/11/2016 11/17/2013  PHQ - 2 Score 0 0     Hearing Screening   125Hz  250Hz  500Hz  1000Hz  2000Hz  3000Hz  4000Hz  6000Hz  8000Hz   Right ear:       100    Left ear:       100    Vision Screening Comments: See Dr. Elmer Picker; oct of every year    Cognitive Function MMSE - Mini Mental State Exam 08/11/2016  Not completed: (No Data)    no issues; manages finances and schedules  no loss of function;     Immunization History  Administered Date(s) Administered  . Influenza Whole 08/11/2007  . Influenza, High Dose Seasonal PF 08/06/2014  . Influenza,inj,Quad PF,36+ Mos 04/24/2013  . Influenza-Unspecified 06/03/2016  . Pneumococcal Conjugate-13 02/19/2014  . Pneumococcal Polysaccharide-23 09/13/2007  . Td 11/04/1995, 09/13/2007   Screening Tests Health Maintenance  Topic Date Due  . MAMMOGRAM  08/05/2016  . TETANUS/TDAP  09/12/2017  . INFLUENZA  VACCINE  Completed  . DEXA SCAN  Completed  . ZOSTAVAX  Addressed  . PNA vac Low Risk Adult  Completed      Plan:   Due Mammogram and will schedule soon  Has not decided about fup with spine doctor; is still hoping exercise will work although when on a cruise this past year, her pain stopped.   GI has outreached for colonoscopy repeat; (not in overdue screen)  The patient plans to fup but will agree to the cologuard, as a neighbor had this. Will defer to GI   During the course of the visit the patient was educated and counseled about the following appropriate screening and preventive services:   Vaccines to include Pneumoccal, Influenza, Hepatitis B, Td, Zostavax, HCV  Electrocardiogram  Cardiovascular Disease  Colorectal cancer screening  Bone density screening  Diabetes screening  Glaucoma screening  Mammography/PAP  Nutrition counseling   Patient Instructions (the written plan) was given to the patient.   Shilah Hefel, RN  08/11/2016  Terressa Koyanagi., DO  Addendum: this patient is seen by Dr. Elmer Picker for treatment of early MD; taking vitamins; cataract fup as well q 6 months; overall vision is good

## 2016-08-11 NOTE — Patient Instructions (Addendum)
[name] , Thank you for taking time to come for your Medicare Wellness Visit. I appreciate your ongoing commitment to your health goals. Please review the following plan we discussed and let me know if I can assist you in the future.   Will have mammogram soon!  These are the goals we discussed: Goals    . Exercise 150 minutes per week (moderate activity)          Walk more consistently Will walk 1st thing in the am        This is a list of the screening recommended for you and due dates:  Health Maintenance  Topic Date Due  . Mammogram  08/05/2016  . Tetanus Vaccine  09/12/2017  . Flu Shot  Completed  . DEXA scan (bone density measurement)  Completed  . Shingles Vaccine  Addressed  . Pneumonia vaccines  Completed      Fall Prevention in the Home Introduction Falls can cause injuries. They can happen to people of all ages. There are many things you can do to make your home safe and to help prevent falls. What can I do on the outside of my home?  Regularly fix the edges of walkways and driveways and fix any cracks.  Remove anything that might make you trip as you walk through a door, such as a raised step or threshold.  Trim any bushes or trees on the path to your home.  Use bright outdoor lighting.  Clear any walking paths of anything that might make someone trip, such as rocks or tools.  Regularly check to see if handrails are loose or broken. Make sure that both sides of any steps have handrails.  Any raised decks and porches should have guardrails on the edges.  Have any leaves, snow, or ice cleared regularly.  Use sand or salt on walking paths during winter.  Clean up any spills in your garage right away. This includes oil or grease spills. What can I do in the bathroom?  Use night lights.  Install grab bars by the toilet and in the tub and shower. Do not use towel bars as grab bars.  Use non-skid mats or decals in the tub or shower.  If you need to  sit down in the shower, use a plastic, non-slip stool.  Keep the floor dry. Clean up any water that spills on the floor as soon as it happens.  Remove soap buildup in the tub or shower regularly.  Attach bath mats securely with double-sided non-slip rug tape.  Do not have throw rugs and other things on the floor that can make you trip. What can I do in the bedroom?  Use night lights.  Make sure that you have a light by your bed that is easy to reach.  Do not use any sheets or blankets that are too big for your bed. They should not hang down onto the floor.  Have a firm chair that has side arms. You can use this for support while you get dressed.  Do not have throw rugs and other things on the floor that can make you trip. What can I do in the kitchen?  Clean up any spills right away.  Avoid walking on wet floors.  Keep items that you use a lot in easy-to-reach places.  If you need to reach something above you, use a strong step stool that has a grab bar.  Keep electrical cords out of the way.  Do not  use floor polish or wax that makes floors slippery. If you must use wax, use non-skid floor wax.  Do not have throw rugs and other things on the floor that can make you trip. What can I do with my stairs?  Do not leave any items on the stairs.  Make sure that there are handrails on both sides of the stairs and use them. Fix handrails that are broken or loose. Make sure that handrails are as long as the stairways.  Check any carpeting to make sure that it is firmly attached to the stairs. Fix any carpet that is loose or worn.  Avoid having throw rugs at the top or bottom of the stairs. If you do have throw rugs, attach them to the floor with carpet tape.  Make sure that you have a light switch at the top of the stairs and the bottom of the stairs. If you do not have them, ask someone to add them for you. What else can I do to help prevent falls?  Wear shoes that:  Do not  have high heels.  Have rubber bottoms.  Are comfortable and fit you well.  Are closed at the toe. Do not wear sandals.  If you use a stepladder:  Make sure that it is fully opened. Do not climb a closed stepladder.  Make sure that both sides of the stepladder are locked into place.  Ask someone to hold it for you, if possible.  Clearly mark and make sure that you can see:  Any grab bars or handrails.  First and last steps.  Where the edge of each step is.  Use tools that help you move around (mobility aids) if they are needed. These include:  Canes.  Walkers.  Scooters.  Crutches.  Turn on the lights when you go into a dark area. Replace any light bulbs as soon as they burn out.  Set up your furniture so you have a clear path. Avoid moving your furniture around.  If any of your floors are uneven, fix them.  If there are any pets around you, be aware of where they are.  Review your medicines with your doctor. Some medicines can make you feel dizzy. This can increase your chance of falling. Ask your doctor what other things that you can do to help prevent falls. This information is not intended to replace advice given to you by your health care provider. Make sure you discuss any questions you have with your health care provider. Document Released: 05/16/2009 Document Revised: 12/26/2015 Document Reviewed: 08/24/2014  2017 [employer]  Health Maintenance, Female Introduction Adopting a healthy lifestyle and getting preventive care can go a long way to promote health and wellness. Talk with your health care provider about what schedule of regular examinations is right for you. This is a good chance for you to check in with your provider about disease prevention and staying healthy. In between checkups, there are plenty of things you can do on your own. Experts have done a lot of research about which lifestyle changes and preventive measures are most likely to keep you  healthy. Ask your health care provider for more information. Weight and diet Eat a healthy diet  Be sure to include plenty of vegetables, fruits, low-fat dairy products, and lean protein.  Do not eat a lot of foods high in solid fats, added sugars, or salt.  Get regular exercise. This is one of the most important things you can do for your  health.  Most adults should exercise for at least 150 minutes each week. The exercise should increase your heart rate and make you sweat (moderate-intensity exercise).  Most adults should also do strengthening exercises at least twice a week. This is in addition to the moderate-intensity exercise. Maintain a healthy weight  Body mass index (BMI) is a measurement that can be used to identify possible weight problems. It estimates body fat based on height and weight. Your health care provider can help determine your BMI and help you achieve or maintain a healthy weight.  For females 25 years of age and older:  A BMI below 18.5 is considered underweight.  A BMI of 18.5 to 24.9 is normal.  A BMI of 25 to 29.9 is considered overweight.  A BMI of 30 and above is considered obese. Watch levels of cholesterol and blood lipids  You should start having your blood tested for lipids and cholesterol at 80 years of age, then have this test every 5 years.  You may need to have your cholesterol levels checked more often if:  Your lipid or cholesterol levels are high.  You are older than 80 years of age.  You are at high risk for heart disease. Cancer screening Lung Cancer  Lung cancer screening is recommended for adults 21-6 years old who are at high risk for lung cancer because of a history of smoking.  A yearly low-dose CT scan of the lungs is recommended for people who:  Currently smoke.  Have quit within the past 15 years.  Have at least a 30-pack-year history of smoking. A pack year is smoking an average of one pack of cigarettes a day for 1  year.  Yearly screening should continue until it has been 15 years since you quit.  Yearly screening should stop if you develop a health problem that would prevent you from having lung cancer treatment. Breast Cancer  Practice breast self-awareness. This means understanding how your breasts normally appear and feel.  It also means doing regular breast self-exams. Let your health care provider know about any changes, no matter how small.  If you are in your 20s or 30s, you should have a clinical breast exam (CBE) by a health care provider every 1-3 years as part of a regular health exam.  If you are 13 or older, have a CBE every year. Also consider having a breast X-ray (mammogram) every year.  If you have a family history of breast cancer, talk to your health care provider about genetic screening.  If you are at high risk for breast cancer, talk to your health care provider about having an MRI and a mammogram every year.  Breast cancer gene (BRCA) assessment is recommended for women who have family members with BRCA-related cancers. BRCA-related cancers include:  Breast.  Ovarian.  Tubal.  Peritoneal cancers.  Results of the assessment will determine the need for genetic counseling and BRCA1 and BRCA2 testing. Cervical Cancer  Your health care provider may recommend that you be screened regularly for cancer of the pelvic organs (ovaries, uterus, and vagina). This screening involves a pelvic examination, including checking for microscopic changes to the surface of your cervix (Pap test). You may be encouraged to have this screening done every 3 years, beginning at age 55.  For women ages 20-65, health care providers may recommend pelvic exams and Pap testing every 3 years, or they may recommend the Pap and pelvic exam, combined with testing for human papilloma virus (HPV),  every 5 years. Some types of HPV increase your risk of cervical cancer. Testing for HPV may also be done on women  of any age with unclear Pap test results.  Other health care providers may not recommend any screening for nonpregnant women who are considered low risk for pelvic cancer and who do not have symptoms. Ask your health care provider if a screening pelvic exam is right for you.  If you have had past treatment for cervical cancer or a condition that could lead to cancer, you need Pap tests and screening for cancer for at least 20 years after your treatment. If Pap tests have been discontinued, your risk factors (such as having a new sexual partner) need to be reassessed to determine if screening should resume. Some women have medical problems that increase the chance of getting cervical cancer. In these cases, your health care provider may recommend more frequent screening and Pap tests. Colorectal Cancer  This type of cancer can be detected and often prevented.  Routine colorectal cancer screening usually begins at 80 years of age and continues through 80 years of age.  Your health care provider may recommend screening at an earlier age if you have risk factors for colon cancer.  Your health care provider may also recommend using home test kits to check for hidden blood in the stool.  A small camera at the end of a tube can be used to examine your colon directly (sigmoidoscopy or colonoscopy). This is done to check for the earliest forms of colorectal cancer.  Routine screening usually begins at age 49.  Direct examination of the colon should be repeated every 5-10 years through 80 years of age. However, you may need to be screened more often if early forms of precancerous polyps or small growths are found. Skin Cancer  Check your skin from head to toe regularly.  Tell your health care provider about any new moles or changes in moles, especially if there is a change in a mole's shape or color.  Also tell your health care provider if you have a mole that is larger than the size of a pencil  eraser.  Always use sunscreen. Apply sunscreen liberally and repeatedly throughout the day.  Protect yourself by wearing long sleeves, pants, a wide-brimmed hat, and sunglasses whenever you are outside. Heart disease, diabetes, and high blood pressure  High blood pressure causes heart disease and increases the risk of stroke. High blood pressure is more likely to develop in:  People who have blood pressure in the high end of the normal range (130-139/85-89 mm Hg).  People who are overweight or obese.  People who are African American.  If you are 44-95 years of age, have your blood pressure checked every 3-5 years. If you are 59 years of age or older, have your blood pressure checked every year. You should have your blood pressure measured twice-once when you are at a hospital or clinic, and once when you are not at a hospital or clinic. Record the average of the two measurements. To check your blood pressure when you are not at a hospital or clinic, you can use:  An automated blood pressure machine at a pharmacy.  A home blood pressure monitor.  If you are between 43 years and 27 years old, ask your health care provider if you should take aspirin to prevent strokes.  Have regular diabetes screenings. This involves taking a blood sample to check your fasting blood sugar level.  If  you are at a normal weight and have a low risk for diabetes, have this test once every three years after 80 years of age.  If you are overweight and have a high risk for diabetes, consider being tested at a younger age or more often. Preventing infection Hepatitis B  If you have a higher risk for hepatitis B, you should be screened for this virus. You are considered at high risk for hepatitis B if:  You were born in a country where hepatitis B is common. Ask your health care provider which countries are considered high risk.  Your parents were born in a high-risk country, and you have not been immunized  against hepatitis B (hepatitis B vaccine).  You have HIV or AIDS.  You use needles to inject street drugs.  You live with someone who has hepatitis B.  You have had sex with someone who has hepatitis B.  You get hemodialysis treatment.  You take certain medicines for conditions, including cancer, organ transplantation, and autoimmune conditions. Hepatitis C  Blood testing is recommended for:  Everyone born from [birth year] through 1963.  Anyone with known risk factors for hepatitis C. Sexually transmitted infections (STIs)  You should be screened for sexually transmitted infections (STIs) including gonorrhea and chlamydia if:  You are sexually active and are younger than 80 years of age.  You are older than 80 years of age and your health care provider tells you that you are at risk for this type of infection.  Your sexual activity has changed since you were last screened and you are at an increased risk for chlamydia or gonorrhea. Ask your health care provider if you are at risk.  If you do not have HIV, but are at risk, it may be recommended that you take a prescription medicine daily to prevent HIV infection. This is called pre-exposure prophylaxis (PrEP). You are considered at risk if:  You are sexually active and do not regularly use condoms or know the HIV status of your partner(s).  You take drugs by injection.  You are sexually active with a partner who has HIV. Talk with your health care provider about whether you are at high risk of being infected with HIV. If you choose to begin PrEP, you should first be tested for HIV. You should then be tested every 3 months for as long as you are taking PrEP. Pregnancy  If you are premenopausal and you may become pregnant, ask your health care provider about preconception counseling.  If you may become pregnant, take 400 to 800 micrograms (mcg) of folic acid every day.  If you want to prevent pregnancy, talk to your health care  provider about birth control (contraception). Osteoporosis and menopause  Osteoporosis is a disease in which the bones lose minerals and strength with aging. This can result in serious bone fractures. Your risk for osteoporosis can be identified using a bone density scan.  If you are 54 years of age or older, or if you are at risk for osteoporosis and fractures, ask your health care provider if you should be screened.  Ask your health care provider whether you should take a calcium or vitamin D supplement to lower your risk for osteoporosis.  Menopause may have certain physical symptoms and risks.  Hormone replacement therapy may reduce some of these symptoms and risks. Talk to your health care provider about whether hormone replacement therapy is right for you. Follow these instructions at home:  Schedule regular health, dental,  and eye exams.  Stay current with your immunizations.  Do not use any tobacco products including cigarettes, chewing tobacco, or electronic cigarettes.  If you are pregnant, do not drink alcohol.  If you are breastfeeding, limit how much and how often you drink alcohol.  Limit alcohol intake to no more than 1 drink per day for nonpregnant women. One drink equals 12 ounces of beer, 5 ounces of wine, or 1 ounces of hard liquor.  Do not use street drugs.  Do not share needles.  Ask your health care provider for help if you need support or information about quitting drugs.  Tell your health care provider if you often feel depressed.  Tell your health care provider if you have ever been abused or do not feel safe at home. This information is not intended to replace advice given to you by your health care provider. Make sure you discuss any questions you have with your health care provider. Document Released: 02/02/2011 Document Revised: 12/26/2015 Document Reviewed: 04/23/2015  2017 [employer]

## 2016-08-17 LAB — HM MAMMOGRAPHY

## 2016-08-18 ENCOUNTER — Ambulatory Visit (INDEPENDENT_AMBULATORY_CARE_PROVIDER_SITE_OTHER): Payer: Medicare Other | Admitting: Physician Assistant

## 2016-08-18 ENCOUNTER — Encounter: Payer: Self-pay | Admitting: Family Medicine

## 2016-08-18 ENCOUNTER — Encounter: Payer: Self-pay | Admitting: Physician Assistant

## 2016-08-18 VITALS — BP 124/70 | HR 68 | Ht 64.0 in | Wt 131.5 lb

## 2016-08-18 DIAGNOSIS — Z1211 Encounter for screening for malignant neoplasm of colon: Secondary | ICD-10-CM

## 2016-08-18 NOTE — Patient Instructions (Signed)
We have sent your demographic and insurance information to [facility]. They should contact you within the next week regarding your Cologuard (colon cancer screening) test. If you have not heard from them within the next week, please call our office at [phone]. 

## 2016-08-18 NOTE — Progress Notes (Signed)
Assessment and plans noted. High quality colonoscopy 10 years ago. At [age] could consider repeat screening strategies versus no additional screening. Patient has opted for Cologuard

## 2016-08-18 NOTE — Progress Notes (Addendum)
Chief Complaint: Consult for a screening colonoscopy  HPI:  Kaitlyn Parks is a 82 year old Caucasian female with a past medical history of CAD, GERD, interstitial cystitis, osteoporosis and thyroid disease, who was referred to me by Terressa Koyanagi, DO for consult of a screening colonoscopy. Per our records patient was referred to Dr. [name] in 2015.   Patient's last colonoscopy was completed on 04/26/06 by Dr. Jarold Motto and at that time was completely normal. Repeat was recommended in 10 years.   Today, the patient presents to clinic and tells me that she has no GI complaints. She would like to proceed with Cologuard testing as she has responsibilities at home of taking care of her husband who had a stroke 3 years ago and cannot function by himself. She tells me that the idea of having a colonoscopy is slightly worrying to her due to the fact that she would have to arrange care for her husband, etc, and she would like to avoid this if possible.   Patient denies fever, chills, blood in her stool, melena, change in bowel habits, weight loss, fatigue, anorexia, nausea, vomiting, heartburn, reflux or symptoms that awaken her at night.   Past Medical History:  Diagnosis Date  . Arthritis    DDD, scoliosis, sees Dr. Lovell Sheehan for this, uses norco very rarely for pain  . CAD (coronary artery disease)    LAD stenting of a 90% lesion 2012  . Cystocele   . Elevated cholesterol   . GERD (gastroesophageal reflux disease)    dx in work up 2016 for atypical CP at OSH  . Interstitial cystitis    sees Dr. [name]  . Osteoporosis   . Rectocele   . Scoliosis   . Thyroid disease    Hypothyroid  . Urinary incontinence    USI  . Uterine prolapse     Past Surgical History:  Procedure Laterality Date  . BLADDER SUSPENSION  2011  . CORONARY ANGIOPLASTY WITH STENT PLACEMENT  2012  . OOPHORECTOMY  2011   BSO  . VAGINAL HYSTERECTOMY  2011   LA[facility]H BSO    Current Outpatient Prescriptions  Medication Sig  Dispense Refill  . aspirin 81 MG tablet Take 81 mg by mouth daily.      . Calcium Carbonate-Vitamin D (CALCIUM + D PO) Take by mouth daily.    . CRESTOR 10 MG tablet TAKE (1/2) TABLET DAILY. 30 tablet 0  . denosumab (PROLIA) 60 MG/ML SOLN injection Inject 60 mg into the skin every 6 (six) months. Administer in upper arm, thigh, or abdomen    . Multiple Vitamins-Calcium (ONE-A-DAY WOMENS PO) Take 1 tablet by mouth daily.     . Multiple Vitamins-Minerals (EYE VITAMINS PO) Take by mouth daily.    . nitroGLYCERIN (NITROSTAT) 0.4 MG SL tablet DISSOLVE 1 TABLET UNDER TONGUE IF NEEDED FOR CHEST PAIN. MAY REPEAT IN 5 MINUTES FOR 3 DOSES. 25 tablet 8  . Omega-3 Fatty Acids (OMEGA 3 PO) Take 2 capsules by mouth daily.     Marland Kitchen SYNTHROID 50 MCG tablet TAKE 1 TABLET EACH DAY. 90 tablet 0  . triamcinolone cream (KENALOG) 0.1 % Apply 1 application topically 2 (two) times daily. 30 g 0   No current facility-administered medications for this visit.     Allergies as of 08/18/2016 - Review Complete 08/18/2016  Allergen Reaction Noted  . Pravastatin sodium Other (See Comments) 05/30/2012  . Zocor [simvastatin] Other (See Comments) 05/30/2012    Family History  Problem Relation Age of  Onset  . Hypertension Mother   . Heart disease Mother   . Heart disease Father   . Heart disease Sister   . Colon cancer Paternal Aunt 62  . Breast cancer Paternal Aunt     Age 42's  . Diabetes Sister   . Breast cancer Cousin     Maternal 1st cousins-Age 41's  . Leukemia Paternal Aunt     Social History   Social History  . Marital status: Married    Spouse name: N/A  . Number of children: N/A  . Years of education: N/A   Occupational History  . Not on file.   Social History Main Topics  . Smoking status: Never Smoker  . Smokeless tobacco: Never Used  . Alcohol use 0.0 oz/week     Comment: Rare  . Drug use: No  . Sexual activity: No   Other Topics Concern  . Not on file   Social History Narrative    Work or School:  none      Home Situation: lives with husband      Spiritual Beliefs: [employer]      Lifestyle: regular exercise (yoga, water aerobics); diet is healthy          Review of Systems:     Constitutional: No weight loss, fever, chills, weakness or fatigue HEENT: Eyes: No change in vision               Ears, Nose, Throat:  No change in hearing Skin: No rash Cardiovascular: Positive heart murmur No chest pain, chest pressure or palpitations   Respiratory: No SOB Gastrointestinal: See HPI and otherwise negative Genitourinary: No dysuria or change in urinary frequency Neurological: No headache Musculoskeletal: Positive for back pain and arthritis Hematologic: No bleeding    Physical Exam:  Vital signs: BP 124/70   Pulse 68   Ht 5\' 4"  (1.626 m)   Wt 131 lb 8 oz (59.6 kg)   BMI 22.57 kg/m    Constitutional:   Pleasant Caucasian female appears to be in NAD, Well developed, Well nourished, alert and cooperative Head:  Normocephalic and atraumatic. Eyes:   PEERL, EOMI. No icterus. Conjunctiva pink. Ears:  Normal auditory acuity. Neck:  Supple Throat: Oral cavity and pharynx without inflammation, swelling or lesion.  Respiratory: Respirations even and unlabored. Lungs clear to auscultation bilaterally.   No wheezes, crackles, or rhonchi.  Cardiovascular: Normal S1, S2. No MRG. Regular rate and rhythm. No peripheral edema, cyanosis or pallor.  Gastrointestinal:  Soft, nondistended, nontender. No rebound or guarding. Normal bowel sounds. No appreciable masses or hepatomegaly. Rectal:  Not performed.  Msk:  Symmetrical without gross deformities. Without edema, no deformity or joint abnormality.  Neurologic:  Alert and  oriented x4;  grossly normal neurologically.  Skin:   Dry and intact without significant lesions or rashes. Psychiatric: Demonstrates good judgement and reason without abnormal affect or behaviors.  No recent labs or imaging.  Assessment: 1. Screening  for colorectal cancer: Last colonoscopy was in 2007, recommendations were for repeat in 10 years, previous colonoscopy was completely normal, patient would like to proceed with Cologuard at this time  Plan: 1. Discussed the pros and cons of Cologuard with the patient. She is a good candidate for this test as she has no history of polyps previously and has responsibilities at home that make it hard for her to have a colonoscopy.  2. Ordered Cologuard testing, pending results, will make further recommendations. Patient is aware. 3. Patient to follow in  clinic as needed in the future or per recommendations after results of above testing. If needs colonoscopy it is [facility]  Kaitlyn MeekerJennifer Falesha Schommer, PA-C [facility] 08/18/2016, 10:00 AM  Cc: Terressa KoyanagiKim, [name], DO   Addendum: Cologuard returned negative 09/04/16-no need for further surveillance colonoscopies. JLL 09/11/16 1407

## 2016-09-02 ENCOUNTER — Other Ambulatory Visit: Payer: Self-pay | Admitting: Family Medicine

## 2016-09-03 ENCOUNTER — Telehealth: Payer: Self-pay | Admitting: Physician Assistant

## 2016-09-03 NOTE — Telephone Encounter (Signed)
This was a call to confirm we had no questions regarding the cologuard process.  Kaitlyn MeekerJennifer Parks is a new provider for Cologuard.

## 2016-09-11 ENCOUNTER — Other Ambulatory Visit: Payer: Self-pay

## 2016-09-11 ENCOUNTER — Telehealth: Payer: Self-pay | Admitting: Physician Assistant

## 2016-09-11 ENCOUNTER — Telehealth: Payer: Self-pay | Admitting: Family Medicine

## 2016-09-11 LAB — COLOGUARD: COLOGUARD: NEGATIVE

## 2016-09-11 NOTE — Telephone Encounter (Signed)
I called the pt and advised that she call Dr Lamar SprinklesPerry's office at [phone] for results as we do not have these here and she agreed.

## 2016-09-11 NOTE — Telephone Encounter (Signed)
Pt had cologuard test through Dr Marina GoodellPerry office, sent in 08/31/16. Would like to know if Dr Selena BattenKim will give her results since she is her dr.

## 2016-09-11 NOTE — Telephone Encounter (Signed)
Pt advised that the cologuard results was received today and will be added to the system for [facility] to review.  I will call her with any further recommendations.

## 2016-11-26 ENCOUNTER — Telehealth: Payer: Self-pay | Admitting: *Deleted

## 2016-11-26 ENCOUNTER — Ambulatory Visit (INDEPENDENT_AMBULATORY_CARE_PROVIDER_SITE_OTHER): Payer: Medicare Other | Admitting: Family Medicine

## 2016-11-26 ENCOUNTER — Encounter: Payer: Self-pay | Admitting: Family Medicine

## 2016-11-26 VITALS — BP 122/80 | HR 69 | Temp 97.8°F | Ht 64.0 in | Wt 135.4 lb

## 2016-11-26 DIAGNOSIS — J329 Chronic sinusitis, unspecified: Secondary | ICD-10-CM

## 2016-11-26 DIAGNOSIS — J310 Chronic rhinitis: Secondary | ICD-10-CM

## 2016-11-26 MED ORDER — DOXYCYCLINE HYCLATE 100 MG PO TABS: 100.0000 mg | ORAL_TABLET | Freq: Two times a day (BID) | ORAL | 0 refills | Status: DC

## 2016-11-26 NOTE — Patient Instructions (Addendum)
BEFORE YOU LEAVE: -follow up: with Dr. Selena Batten in 3 months - will check labs then  Try the claritin daily. If not improving take the antibiotic (doxycycline).  I hope you are feeling better soon! Follow up sooner if worsening, new concerns or you are not improving with treatment.

## 2016-11-26 NOTE — Progress Notes (Signed)
Pre visit review using our clinic review tool, if applicable. No additional management support is needed unless otherwise documented below in the visit note. 

## 2016-11-26 NOTE — Progress Notes (Signed)
HPI:  Acute visit for sinus congestion -started: 3 weeks ago, not improving -symptoms:nasal congestion, sore throat, cough, PNd - now with some max sinus pain intermittently and thick yellow nasal congestions -denies:fever, SOB, NVD, body aches, rash -has tried: nothing -sick contacts/travel/risks: no reported flu, strep or tick exposure -Hx of: allergies sometimes in the spring  ROS: See pertinent positives and negatives per HPI.  Past Medical History:  Diagnosis Date  . Arthritis    DDD, scoliosis, sees Dr. Lovell Sheehan for this, uses norco very rarely for pain  . CAD (coronary artery disease)    LAD stenting of a 90% lesion 2012  . Cystocele   . Elevated cholesterol   . GERD (gastroesophageal reflux disease)    dx in work up 2016 for atypical CP at OSH  . Interstitial cystitis    sees Dr. [name]  . Osteoporosis   . Rectocele   . Scoliosis   . Thyroid disease    Hypothyroid  . Urinary incontinence    USI  . Uterine prolapse     Past Surgical History:  Procedure Laterality Date  . BLADDER SUSPENSION  2011  . CORONARY ANGIOPLASTY WITH STENT PLACEMENT  2012  . OOPHORECTOMY  2011   BSO  . VAGINAL HYSTERECTOMY  2011   [facility] BSO    Family History  Problem Relation Age of Onset  . Hypertension Mother   . Heart disease Mother   . Heart disease Father   . Heart disease Sister   . Colon cancer Paternal Aunt 76  . Breast cancer Paternal Aunt     Age 38's  . Diabetes Sister   . Endometrial cancer Sister   . Breast cancer Cousin     Maternal 1st cousins-Age 72's  . Leukemia Paternal Aunt     Social History   Social History  . Marital status: Married    Spouse name: N/A  . Number of children: N/A  . Years of education: N/A   Social History Main Topics  . Smoking status: Never Smoker  . Smokeless tobacco: Never Used  . Alcohol use 0.0 oz/week     Comment: Rare  . Drug use: No  . Sexual activity: No   Other Topics Concern  . None   Social History  Narrative   Work or School:  none      Home Situation: lives with husband      Spiritual Beliefs: Methodist      Lifestyle: regular exercise (yoga, water aerobics); diet is healthy           Current Outpatient Prescriptions:  .  aspirin 81 MG tablet, Take 81 mg by mouth daily.  , Disp: , Rfl:  .  Calcium Carbonate-Vitamin D (CALCIUM + D PO), Take by mouth daily., Disp: , Rfl:  .  CRESTOR 10 MG tablet, TAKE (1/2) TABLET DAILY., Disp: 30 tablet, Rfl: 0 .  denosumab (PROLIA) 60 MG/ML SOLN injection, Inject 60 mg into the skin every 6 (six) months. Administer in upper arm, thigh, or abdomen, Disp: , Rfl:  .  Multiple Vitamins-Calcium (ONE-A-DAY WOMENS PO), Take 1 tablet by mouth daily. , Disp: , Rfl:  .  Multiple Vitamins-Minerals (EYE VITAMINS PO), Take by mouth daily., Disp: , Rfl:  .  nitroGLYCERIN (NITROSTAT) 0.4 MG SL tablet, DISSOLVE 1 TABLET UNDER TONGUE IF NEEDED FOR CHEST PAIN. MAY REPEAT IN 5 MINUTES FOR 3 DOSES., Disp: 25 tablet, Rfl: 8 .  Omega-3 Fatty Acids (OMEGA 3 PO), Take 2 capsules by  mouth daily. , Disp: , Rfl:  .  SYNTHROID 50 MCG tablet, TAKE 1 TABLET EACH DAY., Disp: 90 tablet, Rfl: 1 .  triamcinolone cream (KENALOG) 0.1 %, Apply 1 application topically 2 (two) times daily., Disp: 30 g, Rfl: 0 .  doxycycline (VIBRA-TABS) 100 MG tablet, Take 1 tablet (100 mg total) by mouth 2 (two) times daily., Disp: 14 tablet, Rfl: 0  EXAM:  Vitals:   11/26/16 1317  BP: 122/80  Pulse: 69  Temp: 97.8 F (36.6 C)    Body mass index is 23.24 kg/m.  GENERAL: vitals reviewed and listed above, alert, oriented, appears well hydrated and in no acute distress  HEENT: atraumatic, conjunttiva clear, no obvious abnormalities on inspection of external nose and ears, normal appearance of ear canals and TMs, thick nasal congestion, mild post oropharyngeal erythema with PND, no tonsillar edema or exudate, no sinus TTP  NECK: no obvious masses on inspection  LUNGS: clear to auscultation  bilaterally, no wheezes, rales or rhonchi, good air movement  CV: HRRR, no peripheral edema  MS: moves all extremities without noticeable abnormality  PSYCH: pleasant and cooperative, no obvious depression or anxiety  ASSESSMENT AND PLAN:  Discussed the following assessment and plan:  Rhinosinusitis  -given HPI and exam findings today, a serious infection or illness is unlikely. We discussed potential etiologies, with AR, potentially with developing sinusitis being most likely. Opted to try claritin for a few days, but abx if worsening or not improving We discussed treatment side effects, likely course, return precautions and signs of developing a serious illness. -of course, we advised to return or notify a doctor immediately if symptoms worsen or persist or new concerns arise.    Patient Instructions  BEFORE YOU LEAVE: -follow up: with Dr. Selena Batten in 3 months - will check labs then  Try the claritin daily. If not improving take the antibiotic (doxycycline).  I hope you are feeling better soon! Follow up sooner if worsening, new concerns or you are not improving with treatment.      Kriste Basque R., DO

## 2016-11-26 NOTE — Telephone Encounter (Signed)
Information has been submitted to pts insurance for verification of benefits. Awaiting response for coverage  

## 2016-11-27 NOTE — Telephone Encounter (Signed)
Verification of benefits have been processed and an approval has been received for pts prolia injection. Pts estimated cost are appx $50. This is only an estimate and cannot be confirmed until benefits are paid. Please advise pt and schedule if needed. If scheduled, once the injection is received, pls contact me back with the date it was received so that I am able to update prolia folder. thanks   Injection to be scheduled AFTER 01/13/2017

## 2016-11-30 ENCOUNTER — Telehealth: Payer: Self-pay

## 2016-11-30 NOTE — Telephone Encounter (Signed)
Called advised of Prolia information. Patient scheduled her appointment for 01/14/17, will notify when she receives her injection.

## 2016-11-30 NOTE — Telephone Encounter (Signed)
Called advised of Prolia information. Patient scheduled her appointment for 01/14/17, will notify when she receives her injection.  

## 2016-12-01 ENCOUNTER — Other Ambulatory Visit: Payer: Self-pay | Admitting: Cardiology

## 2016-12-01 NOTE — Telephone Encounter (Signed)
Rx request sent to pharmacy.  

## 2017-01-14 ENCOUNTER — Ambulatory Visit (INDEPENDENT_AMBULATORY_CARE_PROVIDER_SITE_OTHER): Payer: Medicare Other

## 2017-01-14 DIAGNOSIS — M810 Age-related osteoporosis without current pathological fracture: Secondary | ICD-10-CM

## 2017-01-14 MED ORDER — DENOSUMAB 60 MG/ML ~~LOC~~ SOLN
60.0000 mg | Freq: Once | SUBCUTANEOUS | Status: AC
  Administered 2017-01-14: 60 mg via SUBCUTANEOUS

## 2017-01-22 ENCOUNTER — Other Ambulatory Visit: Payer: Self-pay | Admitting: Cardiology

## 2017-02-02 ENCOUNTER — Encounter: Payer: Self-pay | Admitting: Cardiology

## 2017-02-02 ENCOUNTER — Ambulatory Visit (INDEPENDENT_AMBULATORY_CARE_PROVIDER_SITE_OTHER): Payer: Medicare Other | Admitting: Cardiology

## 2017-02-02 VITALS — BP 100/56 | HR 65 | Ht 65.0 in | Wt 132.0 lb

## 2017-02-02 DIAGNOSIS — I10 Essential (primary) hypertension: Secondary | ICD-10-CM | POA: Diagnosis not present

## 2017-02-02 DIAGNOSIS — E782 Mixed hyperlipidemia: Secondary | ICD-10-CM | POA: Diagnosis not present

## 2017-02-02 DIAGNOSIS — I2510 Atherosclerotic heart disease of native coronary artery without angina pectoris: Secondary | ICD-10-CM

## 2017-02-02 NOTE — Progress Notes (Signed)
HPI The patient presents for followup up of CAD.  Since last saw her she has done well.  The patient denies any new symptoms such as chest discomfort, neck or arm discomfort. There has been no new shortness of breath, PND or orthopnea. There have been no reported palpitations, presyncope or syncope.  She is unable to to golf now because of hip pain.  However, she is doing water aerobics sometimes.    Allergies  Allergen Reactions  . Pravastatin Sodium Other (See Comments)    cystitis  . Zocor [Simvastatin] Other (See Comments)    cystitis    Current Outpatient Prescriptions  Medication Sig Dispense Refill  . aspirin 81 MG tablet Take 81 mg by mouth daily.      . Calcium Carbonate-Vitamin D (CALCIUM + D PO) Take by mouth daily.    Marland Kitchen. denosumab (PROLIA) 60 MG/ML SOLN injection Inject 60 mg into the skin every 6 (six) months. Administer in upper arm, thigh, or abdomen    . Multiple Vitamins-Calcium (ONE-A-DAY WOMENS PO) Take 1 tablet by mouth daily.     . Multiple Vitamins-Minerals (EYE VITAMINS PO) Take by mouth daily.    . nitroGLYCERIN (NITROSTAT) 0.4 MG SL tablet DISSOLVE 1 TABLET UNDER TONGUE IF NEEDED FOR CHEST PAIN. MAY REPEAT IN 5 MINUTES FOR 3 DOSES. 25 tablet 8  . Omega-3 Fatty Acids (OMEGA 3 PO) Take 2 capsules by mouth daily.     . rosuvastatin (CRESTOR) 10 MG tablet TAKE (1/2) TABLET DAILY. 15 tablet 0  . SYNTHROID 50 MCG tablet TAKE 1 TABLET EACH DAY. 90 tablet 1  . triamcinolone cream (KENALOG) 0.1 % Apply 1 application topically 2 (two) times daily. 30 g 0   No current facility-administered medications for this visit.     Past Medical History:  Diagnosis Date  . Arthritis    DDD, scoliosis, sees Dr. Lovell SheehanJenkins for this, uses norco very rarely for pain  . CAD (coronary artery disease)    LAD stenting of a 90% lesion 2012  . Cystocele   . Elevated cholesterol   . GERD (gastroesophageal reflux disease)    dx in work up 2016 for atypical CP at OSH  . Interstitial  cystitis    sees Dr. Annabell HowellsWrenn  . Osteoporosis   . Rectocele   . Scoliosis   . Thyroid disease    Hypothyroid  . Urinary incontinence    USI  . Uterine prolapse     Past Surgical History:  Procedure Laterality Date  . BLADDER SUSPENSION  2011  . CORONARY ANGIOPLASTY WITH STENT PLACEMENT  2012  . OOPHORECTOMY  2011   BSO  . VAGINAL HYSTERECTOMY  2011   [facility] BSO    ROS:   Decreased sleep.  Otherwise as stated in the HPI and negative for all other systems.  PHYSICAL EXAM BP (!) 100/56   Pulse 65   Ht 5\' 5"  (1.651 m)   Wt 132 lb (59.9 kg)   BMI 21.97 kg/m   GENERAL:  Well appearing NECK:  No jugular venous distention, waveform within normal limits, carotid upstroke brisk and symmetric, no bruits, no thyromegaly LUNGS:  Clear to auscultation bilaterally CHEST:  Unremarkable HEART:  PMI not displaced or sustained,S1 and S2 within normal limits, no S3, no S4, no clicks, no rubs, soft apical non radiating systolic murmur, no diastolic murmurs ABD:  Flat, positive bowel sounds normal in frequency in pitch, no bruits, no rebound, no guarding, no midline pulsatile mass, no hepatomegaly, no  splenomegaly EXT:  2 plus pulses throughout, no edema, no cyanosis no clubbing   EKG:  Sinus rhythm, rate 65 , no acute ST-T wave changes.  02/03/2017   Lab Results  Component Value Date   CHOL 167 01/29/2016   TRIG 102 01/29/2016   HDL 56 01/29/2016   LDLCALC 91 01/29/2016   LDLDIRECT 140.6 05/25/2012     ASSESSMENT AND PLAN   CAD, NATIVE VESSEL -  The patient has no new symptoms since stress testing in 2016.  No further cardiovascular testing is indicated.  We will continue with aggressive risk reduction and meds as listed.  HYPERTENSION -  The blood pressure is low today.  No change in therapy is indicated.    Hyperlipidemia - I will have her come back for a lipid profile.

## 2017-02-02 NOTE — Patient Instructions (Signed)
Dr Hochrein recommends that you continue on your current medications as directed. Please refer to the Current Medication list given to you today.  Your physician recommends that you return for lab work at your earliest convenience - FASTING.  Dr Hochrein recommends that you schedule a follow-up appointment in 12 months. You will receive a reminder letter in the mail two months in advance. If you don't receive a letter, please call our office to schedule the follow-up appointment.  If you need a refill on your cardiac medications before your next appointment, please call your pharmacy. 

## 2017-02-03 ENCOUNTER — Encounter: Payer: Self-pay | Admitting: Cardiology

## 2017-02-05 LAB — HEPATIC FUNCTION PANEL
ALT: 14 IU/L (ref 0–32)
AST: 19 IU/L (ref 0–40)
Albumin: 4.4 g/dL (ref 3.5–4.8)
Alkaline Phosphatase: 54 IU/L (ref 39–117)
BILIRUBIN TOTAL: 0.3 mg/dL (ref 0.0–1.2)
BILIRUBIN, DIRECT: 0.11 mg/dL (ref 0.00–0.40)
Total Protein: 6.7 g/dL (ref 6.0–8.5)

## 2017-02-05 LAB — LIPID PANEL
CHOLESTEROL TOTAL: 160 mg/dL (ref 100–199)
Chol/HDL Ratio: 3.1 ratio (ref 0.0–4.4)
HDL: 52 mg/dL (ref >39)
LDL Calculated: 77 mg/dL (ref 0–99)
Triglycerides: 154 mg/dL — ABNORMAL HIGH (ref 0–149)
VLDL CHOLESTEROL CAL: 31 mg/dL (ref 5–40)

## 2017-02-25 ENCOUNTER — Ambulatory Visit: Payer: Medicare Other | Admitting: Family Medicine

## 2017-02-25 ENCOUNTER — Other Ambulatory Visit: Payer: Self-pay | Admitting: Family Medicine

## 2017-03-24 ENCOUNTER — Other Ambulatory Visit: Payer: Self-pay | Admitting: Cardiology

## 2017-03-24 NOTE — Telephone Encounter (Signed)
Rx(s) sent to pharmacy electronically.  

## 2017-04-12 ENCOUNTER — Telehealth: Payer: Self-pay | Admitting: Cardiology

## 2017-04-12 NOTE — Telephone Encounter (Signed)
Patient called and notified she is cleared for surgery by MD Clearance note routed via EPIC to Dr. Sandi RavelingSwinteck/Laurie Faust

## 2017-04-12 NOTE — Telephone Encounter (Signed)
The patient is not going for a high risk surgery.   Per modified Nedra HaiLee criteria her risk estimated rate of myocardial infarction, pulmonary embolism, ventricular fibrillation, cardiac arrest or complete heart block is 0.9%.  Therefore, based on ACC/AHA guidelines, the patient would be at acceptable risk for the planned procedure without further cardiovascular testing.

## 2017-04-12 NOTE — Telephone Encounter (Signed)
[facility] for: 1. Type of surgery: right total hip replacement  2. Date of surgery: TBD 3. Surgeon: Dr. Arlys JohnBrian Swinteck 4. Medications that need to be held & how long: none specified - on ASA 5. Fax and/or Phone: (f) [phone]  (p) [phone] -- attn: Halford DecampLaurie Faust

## 2017-04-22 ENCOUNTER — Encounter: Payer: Self-pay | Admitting: Family Medicine

## 2017-04-30 ENCOUNTER — Encounter: Payer: Self-pay | Admitting: Family Medicine

## 2017-04-30 ENCOUNTER — Ambulatory Visit (INDEPENDENT_AMBULATORY_CARE_PROVIDER_SITE_OTHER): Payer: Medicare Other | Admitting: Family Medicine

## 2017-04-30 ENCOUNTER — Encounter: Payer: Self-pay | Admitting: *Deleted

## 2017-04-30 VITALS — BP 102/62 | HR 81 | Temp 97.8°F | Ht 65.0 in | Wt 130.2 lb

## 2017-04-30 DIAGNOSIS — Z01818 Encounter for other preprocedural examination: Secondary | ICD-10-CM

## 2017-04-30 DIAGNOSIS — E038 Other specified hypothyroidism: Secondary | ICD-10-CM | POA: Diagnosis not present

## 2017-04-30 DIAGNOSIS — Z23 Encounter for immunization: Secondary | ICD-10-CM | POA: Diagnosis not present

## 2017-04-30 LAB — BASIC METABOLIC PANEL
BUN: 23 mg/dL (ref 6–23)
CALCIUM: 9.5 mg/dL (ref 8.4–10.5)
CO2: 27 mEq/L (ref 19–32)
CREATININE: 0.76 mg/dL (ref 0.40–1.20)
Chloride: 103 mEq/L (ref 96–112)
GFR: 77.81 mL/min (ref >60.00)
Glucose, Bld: 105 mg/dL — ABNORMAL HIGH (ref 70–99)
Potassium: 4.3 mEq/L (ref 3.5–5.1)
SODIUM: 138 meq/L (ref 135–145)

## 2017-04-30 LAB — CBC
HCT: 41.9 % (ref 36.0–46.0)
Hemoglobin: 13.8 g/dL (ref 12.0–15.0)
MCHC: 33 g/dL (ref 30.0–36.0)
MCV: 92 fl (ref 78.0–100.0)
PLATELETS: 269 10*3/uL (ref 150.0–400.0)
RBC: 4.55 Mil/uL (ref 3.87–5.11)
RDW: 13.4 % (ref 11.5–15.5)
WBC: 4.6 10*3/uL (ref 4.0–10.5)

## 2017-04-30 LAB — TSH: TSH: 1.31 u[IU]/mL (ref 0.35–4.50)

## 2017-04-30 NOTE — Patient Instructions (Addendum)
BEFORE YOU LEAVE: -flu shot -labs  We have ordered labs or studies at this visit. It can take up to 1-2 weeks for results and processing. IF results require follow up or explanation, we will call you with instructions. Clinically stable results will be released to your [facility]. If you have not heard from [place] or cannot find your results in [facility] in 2 weeks please contact our office at [phone].  If you are not yet signed up for [facility], please consider signing up.  We will send preoperative recommendations to your surgeon's office.  Eat 3 healthy meals per day. Get regular exercise. Continue to seek help for grieving as needed.

## 2017-04-30 NOTE — Progress Notes (Signed)
No chief complaint on file.   HPI:  Patient is seen for optimization of general medical care prior to surgery. Surgery type: R hip replacement Date of surgery: TBD with Dr. Lyla Glassing  Kidney disease? No Prior surgeries/Issues following anesthesia? See past surgical hx Hx MI, heart arrythmia, CHF, angina or stroke? Hx CAD - sees Dr. Percival Spanish for managment Epilepsy or Seizures? none Arthritis or problems with neck or jaw? None per pt Thyroid disease? Hypothyroid on replacement Liver disease? none Asthma, COPD or chronic lung disease? none Diabetes? none (Needs to be evaluated by anesthesia if yes to these questions.)  Other: Poor nutrition, Frail or other: no  METS:  ?Can take care of self, such as eat, dress, or use the toilet (1 MET). yes ?Can walk up a flight of steps or a hill (4 METs).yes ?Can do heavy work around the house such as scrubbing floors or lifting or moving heavy furniture (between 4 and 10 METs). yes ?Can participate in strenuous sports such as swimming, singles tennis, football, basketball, and skiing (>10 METs) unknown - did water aerobic all summer . AHA Risks: Major predictors that require intensive management and may lead to delay in or cancellation of the operative procedure unless emergent: NONE  . Unstable coronary syndromes including unstable or severe angina or recent MI  . Decompensated heart failure including NYHA functional class IV or worsening or new-onset HF  . Significant arrhythmias including high grade AV block, symptomatic ventricular arrhythmias, supraventricular arrhythmias with ventricular rate >100 bpm at rest, symptomatic bradycardia, and newly recognized ventricular tachycardia  . Severe heart valve disease including severe aortic stenosis or symptomatic mitral stenosis   Other clinical predictors that warrant careful assessment of current status: See PMH - cardiologist was notified for cardiac clearance  . History of ischemic heart  disease . History of cerebrovascular disease  . History of compensated heart failure or prior heart failure  . Diabetes mellitus  . Renal insufficiency  Type of surgery and Risk: 1) High risk (reported risk of cardiac death or nonfatal myocardial infarction [MI] often greater than 5 percent):  Marland Kitchen Aortic and other major vascular surgery  . Peripheral artery surgery   2)Intermediate risk (reported risk of cardiac death or nonfatal MI generally 1 to 5 percent):  Marland Kitchen Carotid endarterectomy  . Head and neck surgery  . Intraperitoneal and intrathoracic surgery  . Orthopedic surgery  . Prostate surgery   3)Low risk (reported risk of cardiac death or nonfatal MI generally less than 1 percent):  Marland Kitchen Ambulatory surgery  . Endoscopic procedures  . Superficial procedure  . Cataract surgery  . Breast surgery  Medications that need to be addressed prior to surgery: None Discontinue acei/arbs/non-statin lipid lowering drugs day of surgery ASA stop 7 days before or discuss with cardiology if CV risks, other anticoagulants discuss with cardiology.  ROS: See pertinent positives and negatives per HPI. 11 point ROS negative except where noted.  Past Medical History:  Diagnosis Date  . Arthritis    DDD, scoliosis, sees Dr. Arnoldo Morale for this, uses norco very rarely for pain  . CAD (coronary artery disease)    LAD stenting of a 90% lesion 2012  . Cystocele   . Elevated cholesterol   . GERD (gastroesophageal reflux disease)    dx in work up 2016 for atypical CP at OSH  . Interstitial cystitis    sees Dr. Jeffie Pollock  . Osteoporosis   . Rectocele   . Scoliosis   . Thyroid disease  Hypothyroid  . Urinary incontinence    USI  . Uterine prolapse     Past Surgical History:  Procedure Laterality Date  . BLADDER SUSPENSION  2011  . CORONARY ANGIOPLASTY WITH STENT PLACEMENT  2012  . OOPHORECTOMY  2011   BSO  . VAGINAL HYSTERECTOMY  2011   [facility] BSO    Family History  Problem Relation Age of Onset   . Hypertension Mother   . Heart disease Mother   . Heart disease Father   . Heart disease Sister   . Colon cancer Paternal Aunt 14  . Breast cancer Paternal Aunt        Age 73's  . Diabetes Sister   . Endometrial cancer Sister   . Breast cancer Cousin        Maternal 1st cousins-Age 60's  . Leukemia Paternal Aunt     Social History   Social History  . Marital status: Married    Spouse name: N/A  . Number of children: N/A  . Years of education: N/A   Social History Main Topics  . Smoking status: Never Smoker  . Smokeless tobacco: Never Used  . Alcohol use 0.0 oz/week     Comment: Rare  . Drug use: No  . Sexual activity: No   Other Topics Concern  . None   Social History Narrative   Work or School:  none      Home Situation: lives with husband      Spiritual Beliefs: [employer]      Lifestyle: regular exercise (yoga, water aerobics); diet is healthy           Current Outpatient Prescriptions:  .  aspirin 81 MG tablet, Take 81 mg by mouth daily.  , Disp: , Rfl:  .  Calcium Carbonate-Vitamin D (CALCIUM + D PO), Take by mouth daily., Disp: , Rfl:  .  denosumab (PROLIA) 60 MG/ML SOLN injection, Inject 60 mg into the skin every 6 (six) months. Administer in upper arm, thigh, or abdomen, Disp: , Rfl:  .  Multiple Vitamins-Calcium (ONE-A-DAY WOMENS PO), Take 1 tablet by mouth daily. , Disp: , Rfl:  .  Multiple Vitamins-Minerals (EYE VITAMINS PO), Take by mouth daily., Disp: , Rfl:  .  nitroGLYCERIN (NITROSTAT) 0.4 MG SL tablet, DISSOLVE 1 TABLET UNDER TONGUE IF NEEDED FOR CHEST PAIN. MAY REPEAT IN 5 MINUTES FOR 3 DOSES., Disp: 25 tablet, Rfl: 8 .  Omega-3 Fatty Acids (OMEGA 3 PO), Take 2 capsules by mouth daily. , Disp: , Rfl:  .  rosuvastatin (CRESTOR) 10 MG tablet, TAKE (1/2) TABLET DAILY., Disp: 30 tablet, Rfl: 4 .  SYNTHROID 50 MCG tablet, TAKE 1 TABLET EACH DAY., Disp: 90 tablet, Rfl: 0 .  triamcinolone cream (KENALOG) 0.1 %, Apply 1 application topically 2 (two)  times daily., Disp: 30 g, Rfl: 0  EXAM:  Vitals:   04/30/17 1259  BP: 102/62  Pulse: 81  Temp: 97.8 F (36.6 C)    Body mass index is 21.67 kg/m.  GENERAL: vitals reviewed and listed above, alert, oriented, appears well hydrated and in no acute distress  HEENT: atraumatic, conjunttiva clear, no obvious abnormalities on inspection of external nose and ears  NECK: no obvious masses on inspection, no carotid bruits  LUNGS: clear to auscultation bilaterally, no wheezes, rales or rhonchi, good air movement  CV: HRRR, no peripheral edema, no JVD, BP normal range, normal radial pulses  MS: moves all extremities without noticeable abnormality  PSYCH: pleasant and cooperative, no obvious depression  or anxiety  ASSESSMENT AND PLAN:  Discussed the following assessment and plan: More than 50% of over 40  minutes spent in total in caring for this patient was spent face-to-face with the patient, counseling and/or coordinating care.   Preop examination - Plan: Basic metabolic panel, CBC  Other specified hypothyroidism - Plan: TSH  Need for immunization against influenza - Plan: Flu vaccine HIGH DOSE PF (Fluzone High dose)  Assessment: -Risk factors/comorbidities: CAD, Hypothyroidism -Surgery Risks:intermediate -age, nutritional status, fraility: good nutritional status, age >49, no fraility -functional capacity: > 4 METs wethout symptoms  Recommendations for optimizing general medical care prior to surgery: -advised patient to discuss specific risks morbidity and mortality of surgery with surgeon, CV risks discussed with patient - defer final clearance to surgeon -discussed with patient that all surgeries no matter how minor have risks and relayed information regarding CV risks per cardiology note in EPIC -her cardiologist was contacted for clearance per chart and per his notes "patient would be at acceptable risk for the planned procedure without further cardiovascular  testing" -defer to surgeon and cardiology recs regarding medication perioperatively -advised patient will defer to surgeon for post-op DVT prophylaxis and post op care -advised 3 healthy meals per day with plenty of vegetables and protein, regular exercise as much as is able and treatment of any mental health concerns as needed -we will check CBC, BMP and TSH today and will forward results to surgeon's office  -advised assistant to send letter with these recommendation for pre-op optimization of general medical care prior to surgery to surgeon office  -Patient advised to return or notify a doctor immediately if symptoms worsen or persist or new concerns arise.  Patient Instructions  BEFORE YOU LEAVE: -flu shot -labs  We have ordered labs or studies at this visit. It can take up to 1-2 weeks for results and processing. IF results require follow up or explanation, we will call you with instructions. Clinically stable results will be released to your [facility]. If you have not heard from [place] or cannot find your results in [facility] in 2 weeks please contact our office at [phone].  If you are not yet signed up for [facility], please consider signing up.  We will send preoperative recommendations to your surgeon's office.  Eat 3 healthy meals per day. Get regular exercise. Continue to seek help for grieving as needed.    Terressa Koyanagi   Letter to Surgeon:  Dear Dr. Linna Caprice:  The following patient, Kaitlyn Parks, DOB: 09/07/1934, was seen in our office on the following date, 04/30/17, for optimization of medical care prior to planned hip surgery. Thank you for your kind care. Please see below for a summary and the recommendations we advised.  Assessment: -Risk factors/comorbidities: CAD, Hypothyroidism -Surgery Risks:intermediate -age, nutritional status, fraility: good nutritional status, age >69, no fraility -functional capacity: > 4 METs wethout symptoms  Recommendations for  optimizing general medical care prior to surgery: -advised patient to discuss specific risks regarding morbidity and mortality of surgery with you, CV risks per cardiologist (see note in EPIC)  -discussed with patient that all surgeries no matter how minor have risks and relayed information regarding CV risks per cardiology note in EPIC -her cardiologist was contacted for clearance per chart review and per his notes "patient would be at acceptable risk for the planned procedure without further cardiovascular testing" -defer to you and cardiology regarding her management of her medications perioperatively -advised patient we will defer to you for post-op DVT  prophylaxis and post op care -advised 3 healthy meals per day with plenty of vegetables and protein, regular exercise as much as is able and treatment of any mental health concerns as needed -advised patient be evaluated by anesthesiology prior to surgery given her PMH -we will check CBC, BMP and TSH today and will forward the results to your office   Please feel free to contact us if you have any questions or concerns.  Sincerely,    Colin Benton, DO

## 2017-05-04 ENCOUNTER — Ambulatory Visit: Payer: Self-pay | Admitting: Orthopedic Surgery

## 2017-05-18 ENCOUNTER — Ambulatory Visit: Payer: Self-pay | Admitting: Orthopedic Surgery

## 2017-05-18 NOTE — H&P (View-Only) (Signed)
TOTAL HIP ADMISSION H&P  Patient is admitted for right total hip arthroplasty.  Subjective:  Chief Complaint: right hip pain  HPI: Kaitlyn Parks, 82 y.o. female, has a history of pain and functional disability in the right hip(s) due to arthritis and patient has failed non-surgical conservative treatments for greater than 12 weeks to include NSAID's and/or analgesics, flexibility and strengthening excercises, use of assistive devices and activity modification.  Onset of symptoms was gradual starting 1 years ago with gradually worsening course since that time.The patient noted no past surgery on the right hip(s).  Patient currently rates pain in the right hip at 10 out of 10 with activity. Patient has night pain, worsening of pain with activity and weight bearing, pain that interfers with activities of daily living, pain with passive range of motion and crepitus. Patient has evidence of subchondral cysts, subchondral sclerosis, periarticular osteophytes and joint space narrowing by imaging studies. This condition presents safety issues increasing the risk of falls. There is no current active infection.  Patient Active Problem List   Diagnosis Date Noted  . MIXED HYPERLIPIDEMIA 05/12/2010  . CAD, NATIVE VESSEL 05/09/2010  . BACK PAIN, LUMBOSACRAL, CHRONIC 07/23/2009  . Hypothyroidism 06/08/2007  . MITRAL VALVE PROLAPSE 06/08/2007   Past Medical History:  Diagnosis Date  . Arthritis    DDD, scoliosis, sees Dr. Lovell Sheehan for this, uses norco very rarely for pain  . CAD (coronary artery disease)    LAD stenting of a 90% lesion 2012  . Cystocele   . Elevated cholesterol   . GERD (gastroesophageal reflux disease)    dx in work up 2016 for atypical CP at OSH  . Interstitial cystitis    sees Dr. [name]  . Osteoporosis   . Rectocele   . Scoliosis   . Thyroid disease    Hypothyroid  . Urinary incontinence    USI  . Uterine prolapse     Past Surgical History:  Procedure Laterality Date  .  BLADDER SUSPENSION  2011  . CORONARY ANGIOPLASTY WITH STENT PLACEMENT  2012  . OOPHORECTOMY  2011   BSO  . VAGINAL HYSTERECTOMY  2011   [facility] BSO    Current Outpatient Prescriptions  Medication Sig Dispense Refill Last Dose  . aspirin 81 MG tablet Take 81 mg by mouth daily.     Taking  . Calcium Carbonate-Vitamin D (CALCIUM + D PO) Take by mouth daily.   Taking  . denosumab (PROLIA) 60 MG/ML SOLN injection Inject 60 mg into the skin every 6 (six) months. Administer in upper arm, thigh, or abdomen   Taking  . Multiple Vitamins-Calcium (ONE-A-DAY WOMENS PO) Take 1 tablet by mouth daily.    Taking  . Multiple Vitamins-Minerals (EYE VITAMINS PO) Take by mouth daily.   Taking  . nitroGLYCERIN (NITROSTAT) 0.4 MG SL tablet DISSOLVE 1 TABLET UNDER TONGUE IF NEEDED FOR CHEST PAIN. MAY REPEAT IN 5 MINUTES FOR 3 DOSES. 25 tablet 8 Taking  . Omega-3 Fatty Acids (OMEGA 3 PO) Take 2 capsules by mouth daily.    Taking  . rosuvastatin (CRESTOR) 10 MG tablet TAKE (1/2) TABLET DAILY. 30 tablet 4 Taking  . SYNTHROID 50 MCG tablet TAKE 1 TABLET EACH DAY. 90 tablet 0 Taking  . triamcinolone cream (KENALOG) 0.1 % Apply 1 application topically 2 (two) times daily. 30 g 0 Taking   No current facility-administered medications for this visit.    Allergies  Allergen Reactions  . Pravastatin Sodium Other (See Comments)    cystitis  .  Zocor [Simvastatin] Other (See Comments)    cystitis    Social History  Substance Use Topics  . Smoking status: Never Smoker  . Smokeless tobacco: Never Used  . Alcohol use 0.0 oz/week     Comment: Rare    Family History  Problem Relation Age of Onset  . Hypertension Mother   . Heart disease Mother   . Heart disease Father   . Heart disease Sister   . Colon cancer Paternal Aunt 75  . Breast cancer Paternal Aunt        Age 60's  . Diabetes Sister   . Endometrial cancer Sister   . Breast cancer Cousin        Maternal 1st cousins-Age 50's  . Leukemia Paternal Aunt       Review of Systems  Constitutional: Negative.   HENT: Negative.   Eyes: Negative.   Respiratory: Negative.   Cardiovascular: Negative.   Gastrointestinal: Negative.   Genitourinary: Negative.   Musculoskeletal: Positive for back pain.  Skin: Negative.   Neurological: Negative.   Endo/Heme/Allergies: Negative.   Psychiatric/Behavioral: Negative.     Objective:  Physical Exam  Vitals reviewed. Constitutional: She is oriented to person, place, and time. She appears well-developed and well-nourished.  HENT:  Head: Normocephalic and atraumatic.  Eyes: Pupils are equal, round, and reactive to light. Conjunctivae and EOM are normal.  Neck: Normal range of motion. Neck supple.  Cardiovascular: Normal rate, regular rhythm and intact distal pulses.   Respiratory: Effort normal. No respiratory distress.  GI: Soft. She exhibits no distension.  Genitourinary:  Genitourinary Comments: deferred  Musculoskeletal:       Right hip: She exhibits decreased range of motion, decreased strength and bony tenderness.  Neurological: She is alert and oriented to person, place, and time. She has normal reflexes.  Skin: Skin is warm and dry.  Psychiatric: She has a normal mood and affect. Her behavior is normal. Judgment and thought content normal.    Vital signs in last 24 hours: @VSRANGES@  Labs:   Estimated body mass index is 21.67 kg/m as calculated from the following:   Height as of 04/30/17: 5' 5" (1.651 m).   Weight as of 04/30/17: 59.1 kg (130 lb 3.2 oz).   Imaging Review Plain radiographs demonstrate severe degenerative joint disease of the right hip(s). The bone quality appears to be adequate for age and reported activity level.  Assessment/Plan:  End stage arthritis, right hip(s)  The patient history, physical examination, clinical judgement of the provider and imaging studies are consistent with end stage degenerative joint disease of the right hip(s) and total hip arthroplasty  is deemed medically necessary. The treatment options including medical management, injection therapy, arthroscopy and arthroplasty were discussed at length. The risks and benefits of total hip arthroplasty were presented and reviewed. The risks due to aseptic loosening, infection, stiffness, dislocation/subluxation,  thromboembolic complications and other imponderables were discussed.  The patient acknowledged the explanation, agreed to proceed with the plan and consent was signed. Patient is being admitted for inpatient treatment for surgery, pain control, PT, OT, prophylactic antibiotics, VTE prophylaxis, progressive ambulation and ADL's and discharge planning.The patient is planning to be discharged home with HEP. 

## 2017-05-18 NOTE — H&P (Signed)
TOTAL HIP ADMISSION H&P  Patient is admitted for right total hip arthroplasty.  Subjective:  Chief Complaint: right hip pain  HPI: Kaitlyn Parks, 82 y.o. female, has a history of pain and functional disability in the right hip(s) due to arthritis and patient has failed non-surgical conservative treatments for greater than 12 weeks to include NSAID's and/or analgesics, flexibility and strengthening excercises, use of assistive devices and activity modification.  Onset of symptoms was gradual starting 1 years ago with gradually worsening course since that time.The patient noted no past surgery on the right hip(s).  Patient currently rates pain in the right hip at 10 out of 10 with activity. Patient has night pain, worsening of pain with activity and weight bearing, pain that interfers with activities of daily living, pain with passive range of motion and crepitus. Patient has evidence of subchondral cysts, subchondral sclerosis, periarticular osteophytes and joint space narrowing by imaging studies. This condition presents safety issues increasing the risk of falls. There is no current active infection.  Patient Active Problem List   Diagnosis Date Noted  . MIXED HYPERLIPIDEMIA 05/12/2010  . CAD, NATIVE VESSEL 05/09/2010  . BACK PAIN, LUMBOSACRAL, CHRONIC 07/23/2009  . Hypothyroidism 06/08/2007  . MITRAL VALVE PROLAPSE 06/08/2007   Past Medical History:  Diagnosis Date  . Arthritis    DDD, scoliosis, sees Dr. Lovell Sheehan for this, uses norco very rarely for pain  . CAD (coronary artery disease)    LAD stenting of a 90% lesion 2012  . Cystocele   . Elevated cholesterol   . GERD (gastroesophageal reflux disease)    dx in work up 2016 for atypical CP at OSH  . Interstitial cystitis    sees Dr. [name]  . Osteoporosis   . Rectocele   . Scoliosis   . Thyroid disease    Hypothyroid  . Urinary incontinence    USI  . Uterine prolapse     Past Surgical History:  Procedure Laterality Date  .  BLADDER SUSPENSION  2011  . CORONARY ANGIOPLASTY WITH STENT PLACEMENT  2012  . OOPHORECTOMY  2011   BSO  . VAGINAL HYSTERECTOMY  2011   [facility] BSO    Current Outpatient Prescriptions  Medication Sig Dispense Refill Last Dose  . aspirin 81 MG tablet Take 81 mg by mouth daily.     Taking  . Calcium Carbonate-Vitamin D (CALCIUM + D PO) Take by mouth daily.   Taking  . denosumab (PROLIA) 60 MG/ML SOLN injection Inject 60 mg into the skin every 6 (six) months. Administer in upper arm, thigh, or abdomen   Taking  . Multiple Vitamins-Calcium (ONE-A-DAY WOMENS PO) Take 1 tablet by mouth daily.    Taking  . Multiple Vitamins-Minerals (EYE VITAMINS PO) Take by mouth daily.   Taking  . nitroGLYCERIN (NITROSTAT) 0.4 MG SL tablet DISSOLVE 1 TABLET UNDER TONGUE IF NEEDED FOR CHEST PAIN. MAY REPEAT IN 5 MINUTES FOR 3 DOSES. 25 tablet 8 Taking  . Omega-3 Fatty Acids (OMEGA 3 PO) Take 2 capsules by mouth daily.    Taking  . rosuvastatin (CRESTOR) 10 MG tablet TAKE (1/2) TABLET DAILY. 30 tablet 4 Taking  . SYNTHROID 50 MCG tablet TAKE 1 TABLET EACH DAY. 90 tablet 0 Taking  . triamcinolone cream (KENALOG) 0.1 % Apply 1 application topically 2 (two) times daily. 30 g 0 Taking   No current facility-administered medications for this visit.    Allergies  Allergen Reactions  . Pravastatin Sodium Other (See Comments)    cystitis  .  Zocor [Simvastatin] Other (See Comments)    cystitis    Social History  Substance Use Topics  . Smoking status: Never Smoker  . Smokeless tobacco: Never Used  . Alcohol use 0.0 oz/week     Comment: Rare    Family History  Problem Relation Age of Onset  . Hypertension Mother   . Heart disease Mother   . Heart disease Father   . Heart disease Sister   . Colon cancer Paternal Aunt 16  . Breast cancer Paternal Aunt        Age 31's  . Diabetes Sister   . Endometrial cancer Sister   . Breast cancer Cousin        Maternal 1st cousins-Age 36's  . Leukemia Paternal Aunt       Review of Systems  Constitutional: Negative.   HENT: Negative.   Eyes: Negative.   Respiratory: Negative.   Cardiovascular: Negative.   Gastrointestinal: Negative.   Genitourinary: Negative.   Musculoskeletal: Positive for back pain.  Skin: Negative.   Neurological: Negative.   Endo/Heme/Allergies: Negative.   Psychiatric/Behavioral: Negative.     Objective:  Physical Exam  Vitals reviewed. Constitutional: She is oriented to person, place, and time. She appears well-developed and well-nourished.  HENT:  Head: Normocephalic and atraumatic.  Eyes: Pupils are equal, round, and reactive to light. Conjunctivae and EOM are normal.  Neck: Normal range of motion. Neck supple.  Cardiovascular: Normal rate, regular rhythm and intact distal pulses.   Respiratory: Effort normal. No respiratory distress.  GI: Soft. She exhibits no distension.  Genitourinary:  Genitourinary Comments: deferred  Musculoskeletal:       Right hip: She exhibits decreased range of motion, decreased strength and bony tenderness.  Neurological: She is alert and oriented to person, place, and time. She has normal reflexes.  Skin: Skin is warm and dry.  Psychiatric: She has a normal mood and affect. Her behavior is normal. Judgment and thought content normal.    Vital signs in last 24 hours: @  Labs:   Estimated body mass index is 21.67 kg/m as calculated from the following:   Height as of 04/30/17:  (1.651 m).   Weight as of 04/30/17: 59.1 kg (130 lb 3.2 oz).   Imaging Review Plain radiographs demonstrate severe degenerative joint disease of the right hip(s). The bone quality appears to be adequate for age and reported activity level.  Assessment/Plan:  End stage arthritis, right hip(s)  The patient history, physical examination, clinical judgement of the provider and imaging studies are consistent with end stage degenerative joint disease of the right hip(s) and total hip arthroplasty  is deemed medically necessary. The treatment options including medical management, injection therapy, arthroscopy and arthroplasty were discussed at length. The risks and benefits of total hip arthroplasty were presented and reviewed. The risks due to aseptic loosening, infection, stiffness, dislocation/subluxation,  thromboembolic complications and other imponderables were discussed.  The patient acknowledged the explanation, agreed to proceed with the plan and consent was signed. Patient is being admitted for inpatient treatment for surgery, pain control, PT, OT, prophylactic antibiotics, VTE prophylaxis, progressive ambulation and ADL's and discharge planning.The patient is planning to be discharged home with HEP.

## 2017-05-27 ENCOUNTER — Other Ambulatory Visit: Payer: Self-pay | Admitting: Family Medicine

## 2017-06-02 NOTE — Progress Notes (Addendum)
Clearance Dr. Selena BattenKim 04/30/17 chart Clearance Cr. Hochrein 04/12/17 epic note ovn 04/30/17 Dr. Selena BattenKim chart  EKG 02/02/17 epic  LOV note Dr. Antoine PocheHochrein 11/16/14 epic Stress echo report 02/12/15 on chart

## 2017-06-02 NOTE — Patient Instructions (Signed)
CELA NEWCOM  06/02/2017   Your procedure is scheduled on: 06/10/17  Report to [facility] Main  Entrance Take [place]  elevators to 3rd floor to  Short Stay Center at     1130 AM.    Call this number if you have problems the morning of surgery [phone]   Remember: ONLY 1 PERSON MAY GO WITH YOU TO SHORT STAY TO GET  READY MORNING OF YOUR SURGERY.  Do not eat food or drink liquids :After Midnight.     Take these medicines the morning of surgery with A SIP OF WATER: synthroid, eyedrops as usual                                You may not have any metal on your body including hair pins and              piercings  Do not wear jewelry, make-up, lotions, powders or perfumes, deodorant             Do not wear nail polish.  Do not shave  48 hours prior to surgery.     Do not bring valuables to the hospital. [facility] IS NOT             RESPONSIBLE   FOR VALUABLES.  Contacts, dentures or bridgework may not be worn into surgery.  Leave suitcase in the car. After surgery it may be brought to your room.               Please read over the following fact sheets you were given: _____________________________________________________________________           [facility] - Preparing for Surgery Before surgery, you can play an important role.  Because skin is not sterile, your skin needs to be as free of germs as possible.  You can reduce the number of germs on your skin by washing with CHG (chlorahexidine gluconate) soap before surgery.  CHG is an antiseptic cleaner which kills germs and bonds with the skin to continue killing germs even after washing. Please DO NOT use if you have an allergy to CHG or antibacterial soaps.  If your skin becomes reddened/irritated stop using the CHG and inform your nurse when you arrive at Short Stay. Do not shave (including legs and underarms) for at least 48 hours prior to the first CHG shower.  You may shave your face/neck. Please  follow these instructions carefully:  1.  Shower with CHG Soap the night before surgery and the  morning of Surgery.  2.  If you choose to wash your hair, wash your hair first as usual with your  normal  shampoo.  3.  After you shampoo, rinse your hair and body thoroughly to remove the  shampoo.                           4.  Use CHG as you would any other liquid soap.  You can apply chg directly  to the skin and wash                       Gently with a scrungie or clean washcloth.  5.  Apply the CHG Soap to your body ONLY FROM THE NECK DOWN.   Do not use  on face/ open                           Wound or open sores. Avoid contact with eyes, ears mouth and genitals (private parts).                       Wash face,  Genitals (private parts) with your normal soap.             6.  Wash thoroughly, paying special attention to the area where your surgery  will be performed.  7.  Thoroughly rinse your body with warm water from the neck down.  8.  DO NOT shower/wash with your normal soap after using and rinsing off  the CHG Soap.                9.  Pat yourself dry with a clean towel.            10.  Wear clean pajamas.            11.  Place clean sheets on your bed the night of your first shower and do not  sleep with pets. Day of Surgery : Do not apply any lotions/deodorants the morning of surgery.  Please wear clean clothes to the hospital/surgery center.  FAILURE TO FOLLOW THESE INSTRUCTIONS MAY RESULT IN THE CANCELLATION OF YOUR SURGERY PATIENT SIGNATURE_________________________________  NURSE SIGNATURE__________________________________  ________________________________________________________________________   Adam Phenix  An incentive spirometer is a tool that can help keep your lungs clear and active. This tool measures how well you are filling your lungs with each breath. Taking long deep breaths may help reverse or decrease the chance of developing breathing (pulmonary) problems  (especially infection) following:  A long period of time when you are unable to move or be active. BEFORE THE PROCEDURE   If the spirometer includes an indicator to show your best effort, your nurse or respiratory therapist will set it to a desired goal.  If possible, sit up straight or lean slightly forward. Try not to slouch.  Hold the incentive spirometer in an upright position. INSTRUCTIONS FOR USE  1. Sit on the edge of your bed if possible, or sit up as far as you can in bed or on a chair. 2. Hold the incentive spirometer in an upright position. 3. Breathe out normally. 4. Place the mouthpiece in your mouth and seal your lips tightly around it. 5. Breathe in slowly and as deeply as possible, raising the piston or the ball toward the top of the column. 6. Hold your breath for 3-5 seconds or for as long as possible. Allow the piston or ball to fall to the bottom of the column. 7. Remove the mouthpiece from your mouth and breathe out normally. 8. Rest for a few seconds and repeat Steps 1 through 7 at least 10 times every 1-2 hours when you are awake. Take your time and take a few normal breaths between deep breaths. 9. The spirometer may include an indicator to show your best effort. Use the indicator as a goal to work toward during each repetition. 10. After each set of 10 deep breaths, practice coughing to be sure your lungs are clear. If you have an incision (the cut made at the time of surgery), support your incision when coughing by placing a pillow or rolled up towels firmly against it. Once you are able to get out of bed, walk around  indoors and cough well. You may stop using the incentive spirometer when instructed by your caregiver.  RISKS AND COMPLICATIONS  Take your time so you do not get dizzy or light-headed.  If you are in pain, you may need to take or ask for pain medication before doing incentive spirometry. It is harder to take a deep breath if you are having  pain. AFTER USE  Rest and breathe slowly and easily.  It can be helpful to keep track of a log of your progress. Your caregiver can provide you with a simple table to help with this. If you are using the spirometer at home, follow these instructions: SEEK MEDICAL CARE IF:   You are having difficultly using the spirometer.  You have trouble using the spirometer as often as instructed.  Your pain medication is not giving enough relief while using the spirometer.  You develop fever of 100.5 F (38.1 C) or higher. SEEK IMMEDIATE MEDICAL CARE IF:   You cough up bloody sputum that had not been present before.  You develop fever of 102 F (38.9 C) or greater.  You develop worsening pain at or near the incision site. MAKE SURE YOU:   Understand these instructions.  Will watch your condition.  Will get help right away if you are not doing well or get worse. Document Released: 11/30/2006 Document Revised: 10/12/2011 Document Reviewed: 01/31/2007 ExitCare Patient Information 2014 [employer], [place].   ________________________________________________________________________  WHAT IS A BLOOD TRANSFUSION? Blood Transfusion Information  A transfusion is the replacement of blood or some of its parts. Blood is made up of multiple cells which provide different functions.  Red blood cells carry oxygen and are used for blood loss replacement.  White blood cells fight against infection.  Platelets control bleeding.  Plasma helps clot blood.  Other blood products are available for specialized needs, such as hemophilia or other clotting disorders. BEFORE THE TRANSFUSION  Who gives blood for transfusions?   Healthy volunteers who are fully evaluated to make sure their blood is safe. This is blood bank blood. Transfusion therapy is the safest it has ever been in the practice of medicine. Before blood is taken from a donor, a complete history is taken to make sure that person has no history  of diseases nor engages in risky social behavior (examples are intravenous drug use or sexual activity with multiple partners). The donor's travel history is screened to minimize risk of transmitting infections, such as malaria. The donated blood is tested for signs of infectious diseases, such as HIV and hepatitis. The blood is then tested to be sure it is compatible with you in order to minimize the chance of a transfusion reaction. If you or a relative donates blood, this is often done in anticipation of surgery and is not appropriate for emergency situations. It takes many days to process the donated blood. RISKS AND COMPLICATIONS Although transfusion therapy is very safe and saves many lives, the main dangers of transfusion include:   Getting an infectious disease.  Developing a transfusion reaction. This is an allergic reaction to something in the blood you were given. Every precaution is taken to prevent this. The decision to have a blood transfusion has been considered carefully by your caregiver before blood is given. Blood is not given unless the benefits outweigh the risks. AFTER THE TRANSFUSION  Right after receiving a blood transfusion, you will usually feel much better and more energetic. This is especially true if your red blood cells have gotten  low (anemic). The transfusion raises the level of the red blood cells which carry oxygen, and this usually causes an energy increase.  The nurse administering the transfusion will monitor you carefully for complications. HOME CARE INSTRUCTIONS  No special instructions are needed after a transfusion. You may find your energy is better. Speak with your caregiver about any limitations on activity for underlying diseases you may have. SEEK MEDICAL CARE IF:   Your condition is not improving after your transfusion.  You develop redness or irritation at the intravenous (IV) site. SEEK IMMEDIATE MEDICAL CARE IF:  Any of the following symptoms  occur over the next 12 hours:  Shaking chills.  You have a temperature by mouth above 102 F (38.9 C), not controlled by medicine.  Chest, back, or muscle pain.  People around you feel you are not acting correctly or are confused.  Shortness of breath or difficulty breathing.  Dizziness and fainting.  You get a rash or develop hives.  You have a decrease in urine output.  Your urine turns a dark color or changes to pink, red, or brown. Any of the following symptoms occur over the next 10 days:  You have a temperature by mouth above 102 F (38.9 C), not controlled by medicine.  Shortness of breath.  Weakness after normal activity.  The white part of the eye turns yellow (jaundice).  You have a decrease in the amount of urine or are urinating less often.  Your urine turns a dark color or changes to pink, red, or brown. Document Released: 07/17/2000 Document Revised: 10/12/2011 Document Reviewed: 03/05/2008 [facility] Patient Information 2014 [place], [place].  _______________________________________________________________________

## 2017-06-04 ENCOUNTER — Encounter (HOSPITAL_COMMUNITY)
Admission: RE | Admit: 2017-06-04 | Discharge: 2017-06-04 | Disposition: A | Discharge status: Home / Self Care | Payer: Medicare Other | Source: Ambulatory Visit | Attending: Orthopedic Surgery | Admitting: Orthopedic Surgery

## 2017-06-04 ENCOUNTER — Encounter (HOSPITAL_COMMUNITY): Payer: Self-pay

## 2017-06-04 DIAGNOSIS — Z01812 Encounter for preprocedural laboratory examination: Secondary | ICD-10-CM | POA: Insufficient documentation

## 2017-06-04 HISTORY — DX: Hypothyroidism, unspecified: E03.9

## 2017-06-04 HISTORY — DX: Cardiac murmur, unspecified: R01.1

## 2017-06-04 LAB — SURGICAL PCR SCREEN
MRSA, PCR: NEGATIVE
Staphylococcus aureus: POSITIVE — AB

## 2017-06-04 LAB — BASIC METABOLIC PANEL
Anion gap: 7 (ref 5–15)
BUN: 19 mg/dL (ref 6–20)
CHLORIDE: 107 mmol/L (ref 101–111)
CO2: 24 mmol/L (ref 22–32)
CREATININE: 1.03 mg/dL — AB (ref 0.44–1.00)
Calcium: 8.9 mg/dL (ref 8.9–10.3)
GFR calc Af Amer: 58 mL/min — ABNORMAL LOW (ref >60)
GFR calc non Af Amer: 50 mL/min — ABNORMAL LOW (ref >60)
Glucose, Bld: 114 mg/dL — ABNORMAL HIGH (ref 65–99)
POTASSIUM: 4.6 mmol/L (ref 3.5–5.1)
SODIUM: 138 mmol/L (ref 135–145)

## 2017-06-04 LAB — CBC
HEMATOCRIT: 40.6 % (ref 36.0–46.0)
Hemoglobin: 13.3 g/dL (ref 12.0–15.0)
MCH: 29.8 pg (ref 26.0–34.0)
MCHC: 32.8 g/dL (ref 30.0–36.0)
MCV: 91 fL (ref 78.0–100.0)
Platelets: 257 10*3/uL (ref 150–400)
RBC: 4.46 MIL/uL (ref 3.87–5.11)
RDW: 12.6 % (ref 11.5–15.5)
WBC: 4.6 10*3/uL (ref 4.0–10.5)

## 2017-06-04 LAB — ABO/RH: ABO/RH(D): AB POS

## 2017-06-07 NOTE — Progress Notes (Signed)
06-04-17 PCR result, sent to Dr. Linna CapriceSwinteck for review.

## 2017-06-10 ENCOUNTER — Inpatient Hospital Stay (HOSPITAL_COMMUNITY): Payer: Medicare Other

## 2017-06-10 ENCOUNTER — Encounter (HOSPITAL_COMMUNITY)
Admission: RE | Disposition: A | Discharge status: Home / Self Care | Payer: Self-pay | Source: Ambulatory Visit | Attending: Orthopedic Surgery

## 2017-06-10 ENCOUNTER — Inpatient Hospital Stay (HOSPITAL_COMMUNITY): Payer: Medicare Other | Admitting: Anesthesiology

## 2017-06-10 ENCOUNTER — Other Ambulatory Visit: Payer: Self-pay

## 2017-06-10 ENCOUNTER — Encounter (HOSPITAL_COMMUNITY): Payer: Self-pay | Admitting: Emergency Medicine

## 2017-06-10 ENCOUNTER — Inpatient Hospital Stay (HOSPITAL_COMMUNITY)
Admission: RE | Admit: 2017-06-10 | Discharge: 2017-06-11 | DRG: 470 | Disposition: A | Discharge status: Home / Self Care | Payer: Medicare Other | Source: Ambulatory Visit | Attending: Orthopedic Surgery | Admitting: Orthopedic Surgery

## 2017-06-10 DIAGNOSIS — M1611 Unilateral primary osteoarthritis, right hip: Principal | ICD-10-CM | POA: Diagnosis present

## 2017-06-10 DIAGNOSIS — Z888 Allergy status to other drugs, medicaments and biological substances status: Secondary | ICD-10-CM

## 2017-06-10 DIAGNOSIS — Z79890 Hormone replacement therapy (postmenopausal): Secondary | ICD-10-CM

## 2017-06-10 DIAGNOSIS — I341 Nonrheumatic mitral (valve) prolapse: Secondary | ICD-10-CM | POA: Diagnosis present

## 2017-06-10 DIAGNOSIS — E782 Mixed hyperlipidemia: Secondary | ICD-10-CM | POA: Diagnosis present

## 2017-06-10 DIAGNOSIS — Z955 Presence of coronary angioplasty implant and graft: Secondary | ICD-10-CM

## 2017-06-10 DIAGNOSIS — K219 Gastro-esophageal reflux disease without esophagitis: Secondary | ICD-10-CM | POA: Diagnosis present

## 2017-06-10 DIAGNOSIS — M25551 Pain in right hip: Secondary | ICD-10-CM | POA: Diagnosis present

## 2017-06-10 DIAGNOSIS — E7800 Pure hypercholesterolemia, unspecified: Secondary | ICD-10-CM | POA: Diagnosis present

## 2017-06-10 DIAGNOSIS — E039 Hypothyroidism, unspecified: Secondary | ICD-10-CM | POA: Diagnosis present

## 2017-06-10 DIAGNOSIS — M419 Scoliosis, unspecified: Secondary | ICD-10-CM | POA: Diagnosis present

## 2017-06-10 DIAGNOSIS — Z09 Encounter for follow-up examination after completed treatment for conditions other than malignant neoplasm: Secondary | ICD-10-CM

## 2017-06-10 DIAGNOSIS — Z7982 Long term (current) use of aspirin: Secondary | ICD-10-CM

## 2017-06-10 DIAGNOSIS — I2510 Atherosclerotic heart disease of native coronary artery without angina pectoris: Secondary | ICD-10-CM | POA: Diagnosis present

## 2017-06-10 DIAGNOSIS — N3010 Interstitial cystitis (chronic) without hematuria: Secondary | ICD-10-CM | POA: Diagnosis present

## 2017-06-10 DIAGNOSIS — M810 Age-related osteoporosis without current pathological fracture: Secondary | ICD-10-CM | POA: Diagnosis present

## 2017-06-10 HISTORY — PX: TOTAL HIP ARTHROPLASTY: SHX124

## 2017-06-10 LAB — TYPE AND SCREEN
ABO/RH(D): AB POS
ANTIBODY SCREEN: NEGATIVE

## 2017-06-10 SURGERY — ARTHROPLASTY, HIP, TOTAL, ANTERIOR APPROACH
Anesthesia: Spinal | Site: Hip | Laterality: Right

## 2017-06-10 MED ORDER — BUPIVACAINE HCL (PF) 0.5 % IJ SOLN
INTRAMUSCULAR | Status: AC
  Filled 2017-06-10: qty 30

## 2017-06-10 MED ORDER — ROSUVASTATIN CALCIUM 5 MG PO TABS
5.0000 mg | ORAL_TABLET | Freq: Every day | ORAL | Status: DC
  Administered 2017-06-10: 5 mg via ORAL
  Filled 2017-06-10: qty 1

## 2017-06-10 MED ORDER — ONDANSETRON HCL 4 MG/2ML IJ SOLN: 4.0000 mg | Freq: Once | INTRAMUSCULAR | Status: DC | PRN

## 2017-06-10 MED ORDER — ONDANSETRON HCL 4 MG/2ML IJ SOLN
INTRAMUSCULAR | Status: AC
  Filled 2017-06-10: qty 2

## 2017-06-10 MED ORDER — MIDAZOLAM HCL 2 MG/2ML IJ SOLN
INTRAMUSCULAR | Status: AC
Start: 2017-06-10 — End: 2017-06-10
  Filled 2017-06-10: qty 2

## 2017-06-10 MED ORDER — CYCLOSPORINE 0.05 % OP EMUL
1.0000 [drp] | Freq: Two times a day (BID) | OPHTHALMIC | Status: DC
  Administered 2017-06-10 – 2017-06-11 (×2): 1 [drp] via OPHTHALMIC
  Filled 2017-06-10 (×2): qty 1

## 2017-06-10 MED ORDER — ACETAMINOPHEN 325 MG PO TABS: 650.0000 mg | ORAL_TABLET | ORAL | Status: DC | PRN

## 2017-06-10 MED ORDER — ACETAMINOPHEN 650 MG RE SUPP: 650.0000 mg | RECTAL | Status: DC | PRN

## 2017-06-10 MED ORDER — PHENOL 1.4 % MT LIQD: 1.0000 | OROMUCOSAL | Status: DC | PRN

## 2017-06-10 MED ORDER — DEXTROSE 5 % IV SOLN
500.0000 mg | Freq: Four times a day (QID) | INTRAVENOUS | Status: DC | PRN
  Filled 2017-06-10: qty 5

## 2017-06-10 MED ORDER — KETOROLAC TROMETHAMINE 30 MG/ML IJ SOLN
INTRAMUSCULAR | Status: DC | PRN
  Administered 2017-06-10: 30 mg

## 2017-06-10 MED ORDER — TRANEXAMIC ACID 1000 MG/10ML IV SOLN
1000.0000 mg | Freq: Once | INTRAVENOUS | Status: AC
  Administered 2017-06-10: 1000 mg via INTRAVENOUS
  Filled 2017-06-10: qty 10

## 2017-06-10 MED ORDER — SENNA 8.6 MG PO TABS: 2.0000 | ORAL_TABLET | Freq: Every day | ORAL | Status: DC

## 2017-06-10 MED ORDER — LEVOTHYROXINE SODIUM 50 MCG PO TABS
50.0000 ug | ORAL_TABLET | Freq: Every day | ORAL | Status: DC
  Administered 2017-06-11: 06:00:00 50 ug via ORAL
  Filled 2017-06-10: qty 1

## 2017-06-10 MED ORDER — POVIDONE-IODINE 10 % EX SWAB
2.0000 "application " | Freq: Once | CUTANEOUS | Status: AC
  Administered 2017-06-10: 2 via TOPICAL

## 2017-06-10 MED ORDER — SODIUM CHLORIDE 0.9 % IV SOLN: INTRAVENOUS | Status: DC

## 2017-06-10 MED ORDER — NITROGLYCERIN 0.4 MG SL SUBL: 0.4000 mg | SUBLINGUAL_TABLET | SUBLINGUAL | Status: DC | PRN

## 2017-06-10 MED ORDER — ISOPROPYL ALCOHOL 70 % SOLN
Status: DC | PRN
  Administered 2017-06-10: 1 via TOPICAL

## 2017-06-10 MED ORDER — DEXAMETHASONE SODIUM PHOSPHATE 10 MG/ML IJ SOLN: 10.0000 mg | Freq: Once | INTRAMUSCULAR | Status: DC

## 2017-06-10 MED ORDER — SODIUM CHLORIDE 0.9 % IV SOLN
INTRAVENOUS | Status: DC
  Administered 2017-06-10 – 2017-06-11 (×2): via INTRAVENOUS

## 2017-06-10 MED ORDER — LACTATED RINGERS IV SOLN
INTRAVENOUS | Status: DC
  Administered 2017-06-10 (×2): via INTRAVENOUS

## 2017-06-10 MED ORDER — ONDANSETRON HCL 4 MG/2ML IJ SOLN
INTRAMUSCULAR | Status: DC | PRN
  Administered 2017-06-10: 4 mg via INTRAVENOUS

## 2017-06-10 MED ORDER — DOCUSATE SODIUM 100 MG PO CAPS
100.0000 mg | ORAL_CAPSULE | Freq: Two times a day (BID) | ORAL | Status: DC
  Administered 2017-06-10 – 2017-06-11 (×2): 100 mg via ORAL
  Filled 2017-06-10 (×2): qty 1

## 2017-06-10 MED ORDER — KETOROLAC TROMETHAMINE 15 MG/ML IJ SOLN
7.5000 mg | Freq: Four times a day (QID) | INTRAMUSCULAR | Status: DC
  Administered 2017-06-10 – 2017-06-11 (×3): 7.5 mg via INTRAVENOUS
  Filled 2017-06-10 (×4): qty 1

## 2017-06-10 MED ORDER — METOCLOPRAMIDE HCL 5 MG/ML IJ SOLN: 5.0000 mg | Freq: Three times a day (TID) | INTRAMUSCULAR | Status: DC | PRN

## 2017-06-10 MED ORDER — DIPHENHYDRAMINE HCL 12.5 MG/5ML PO ELIX: 12.5000 mg | ORAL_SOLUTION | ORAL | Status: DC | PRN

## 2017-06-10 MED ORDER — BUPIVACAINE-EPINEPHRINE 0.25% -1:200000 IJ SOLN
INTRAMUSCULAR | Status: DC | PRN
  Administered 2017-06-10: 30 mL

## 2017-06-10 MED ORDER — ALUM & MAG HYDROXIDE-SIMETH 200-200-20 MG/5ML PO SUSP: 30.0000 mL | ORAL | Status: DC | PRN

## 2017-06-10 MED ORDER — EPHEDRINE SULFATE 50 MG/ML IJ SOLN
INTRAMUSCULAR | Status: DC | PRN
  Administered 2017-06-10 (×2): 10 mg via INTRAVENOUS

## 2017-06-10 MED ORDER — TRANEXAMIC ACID 1000 MG/10ML IV SOLN
1000.0000 mg | INTRAVENOUS | Status: AC
  Administered 2017-06-10: 1000 mg via INTRAVENOUS
  Filled 2017-06-10: qty 1100

## 2017-06-10 MED ORDER — SODIUM CHLORIDE 0.9 % IJ SOLN
INTRAMUSCULAR | Status: DC | PRN
Start: 2017-06-10 — End: 2017-06-10
  Administered 2017-06-10: 30 mL

## 2017-06-10 MED ORDER — BUPIVACAINE HCL (PF) 0.5 % IJ SOLN
INTRAMUSCULAR | Status: DC | PRN
  Administered 2017-06-10: 3 mL via INTRATHECAL

## 2017-06-10 MED ORDER — CEFAZOLIN SODIUM-DEXTROSE 1-4 GM/50ML-% IV SOLN
1.0000 g | Freq: Four times a day (QID) | INTRAVENOUS | Status: AC
  Administered 2017-06-10 (×2): 1 g via INTRAVENOUS
  Filled 2017-06-10 (×2): qty 50

## 2017-06-10 MED ORDER — CHLORHEXIDINE GLUCONATE 4 % EX LIQD
60.0000 mL | Freq: Once | CUTANEOUS | Status: DC
Start: 2017-06-10 — End: 2017-06-10

## 2017-06-10 MED ORDER — ONDANSETRON HCL 4 MG/2ML IJ SOLN: 4.0000 mg | Freq: Four times a day (QID) | INTRAMUSCULAR | Status: DC | PRN

## 2017-06-10 MED ORDER — EPHEDRINE 5 MG/ML INJ
INTRAVENOUS | Status: AC
  Filled 2017-06-10: qty 10

## 2017-06-10 MED ORDER — DEXAMETHASONE SODIUM PHOSPHATE 10 MG/ML IJ SOLN
INTRAMUSCULAR | Status: DC | PRN
  Administered 2017-06-10: 10 mg via INTRAVENOUS

## 2017-06-10 MED ORDER — CEFAZOLIN SODIUM-DEXTROSE 2-4 GM/100ML-% IV SOLN
2.0000 g | INTRAVENOUS | Status: AC
  Administered 2017-06-10: 2 g via INTRAVENOUS
  Filled 2017-06-10: qty 100

## 2017-06-10 MED ORDER — WATER FOR IRRIGATION, STERILE IR SOLN
Status: DC | PRN
  Administered 2017-06-10: 2000 mL

## 2017-06-10 MED ORDER — SODIUM CHLORIDE 0.9 % IR SOLN
Status: DC | PRN
  Administered 2017-06-10: 4000 mL

## 2017-06-10 MED ORDER — LIDOCAINE 2% (20 MG/ML) 5 ML SYRINGE
INTRAMUSCULAR | Status: AC
Start: 2017-06-10 — End: 2017-06-10
  Filled 2017-06-10: qty 5

## 2017-06-10 MED ORDER — POLYETHYLENE GLYCOL 3350 17 G PO PACK: 17.0000 g | PACK | Freq: Every day | ORAL | Status: DC | PRN

## 2017-06-10 MED ORDER — METHOCARBAMOL 500 MG PO TABS
500.0000 mg | ORAL_TABLET | Freq: Four times a day (QID) | ORAL | Status: DC | PRN
  Administered 2017-06-10 – 2017-06-11 (×3): 500 mg via ORAL
  Filled 2017-06-10 (×3): qty 1

## 2017-06-10 MED ORDER — HYDROMORPHONE HCL 1 MG/ML IJ SOLN: 0.2500 mg | INTRAMUSCULAR | Status: DC | PRN

## 2017-06-10 MED ORDER — ASPIRIN 81 MG PO CHEW
81.0000 mg | CHEWABLE_TABLET | Freq: Two times a day (BID) | ORAL | Status: DC
  Administered 2017-06-10 – 2017-06-11 (×2): 81 mg via ORAL
  Filled 2017-06-10 (×2): qty 1

## 2017-06-10 MED ORDER — MENTHOL 3 MG MT LOZG: 1.0000 | LOZENGE | OROMUCOSAL | Status: DC | PRN

## 2017-06-10 MED ORDER — PROPOFOL 10 MG/ML IV BOLUS
INTRAVENOUS | Status: DC | PRN
  Administered 2017-06-10 (×2): 10 mg via INTRAVENOUS

## 2017-06-10 MED ORDER — KETOTIFEN FUMARATE 0.025 % OP SOLN
1.0000 [drp] | Freq: Four times a day (QID) | OPHTHALMIC | Status: DC
  Administered 2017-06-10 – 2017-06-11 (×3): 1 [drp] via OPHTHALMIC
  Filled 2017-06-10: qty 5

## 2017-06-10 MED ORDER — PROPOFOL 500 MG/50ML IV EMUL
INTRAVENOUS | Status: DC | PRN
  Administered 2017-06-10: 25 ug/kg/min via INTRAVENOUS

## 2017-06-10 MED ORDER — METOCLOPRAMIDE HCL 5 MG PO TABS: 5.0000 mg | ORAL_TABLET | Freq: Three times a day (TID) | ORAL | Status: DC | PRN

## 2017-06-10 MED ORDER — KETOROLAC TROMETHAMINE 30 MG/ML IJ SOLN
INTRAMUSCULAR | Status: AC
  Filled 2017-06-10: qty 1

## 2017-06-10 MED ORDER — ONDANSETRON HCL 4 MG PO TABS: 4.0000 mg | ORAL_TABLET | Freq: Four times a day (QID) | ORAL | Status: DC | PRN

## 2017-06-10 MED ORDER — DEXAMETHASONE SODIUM PHOSPHATE 10 MG/ML IJ SOLN
INTRAMUSCULAR | Status: AC
  Filled 2017-06-10: qty 1

## 2017-06-10 MED ORDER — SODIUM CHLORIDE 0.9 % IJ SOLN
INTRAMUSCULAR | Status: AC
  Filled 2017-06-10: qty 50

## 2017-06-10 MED ORDER — ISOPROPYL ALCOHOL 70 % SOLN
Status: AC
  Filled 2017-06-10: qty 480

## 2017-06-10 MED ORDER — MIDAZOLAM HCL 2 MG/2ML IJ SOLN
INTRAMUSCULAR | Status: DC | PRN
  Administered 2017-06-10 (×2): 0.5 mg via INTRAVENOUS
  Administered 2017-06-10: 1 mg via INTRAVENOUS

## 2017-06-10 MED ORDER — MEPERIDINE HCL 50 MG/ML IJ SOLN: 6.2500 mg | INTRAMUSCULAR | Status: DC | PRN

## 2017-06-10 MED ORDER — BUPIVACAINE-EPINEPHRINE (PF) 0.25% -1:200000 IJ SOLN
INTRAMUSCULAR | Status: AC
  Filled 2017-06-10: qty 30

## 2017-06-10 MED ORDER — CHLORHEXIDINE GLUCONATE 4 % EX LIQD: 60.0000 mL | Freq: Once | CUTANEOUS | Status: DC

## 2017-06-10 MED ORDER — ACETAMINOPHEN 10 MG/ML IV SOLN
1000.0000 mg | INTRAVENOUS | Status: AC
  Administered 2017-06-10: 1000 mg via INTRAVENOUS
  Filled 2017-06-10: qty 100

## 2017-06-10 MED ORDER — HYDROCODONE-ACETAMINOPHEN 5-325 MG PO TABS
1.0000 | ORAL_TABLET | ORAL | Status: DC | PRN
  Administered 2017-06-10 (×2): 1 via ORAL
  Filled 2017-06-10 (×2): qty 1

## 2017-06-10 MED ORDER — HYDROCODONE-ACETAMINOPHEN 5-325 MG PO TABS
2.0000 | ORAL_TABLET | ORAL | Status: DC | PRN
  Administered 2017-06-11 (×2): 2 via ORAL
  Filled 2017-06-10 (×3): qty 2

## 2017-06-10 MED ORDER — HYDROMORPHONE HCL 1 MG/ML IJ SOLN
0.5000 mg | INTRAMUSCULAR | Status: DC | PRN
  Administered 2017-06-10: 22:00:00 1 mg via INTRAVENOUS
  Filled 2017-06-10: qty 1

## 2017-06-10 SURGICAL SUPPLY — 43 items
BAG DECANTER FOR FLEXI CONT (MISCELLANEOUS) IMPLANT
BAG ZIPLOCK 12X15 (MISCELLANEOUS) IMPLANT
CAPT HIP TOTAL 2 ×3 IMPLANT
CHLORAPREP W/TINT 26ML (MISCELLANEOUS) ×3 IMPLANT
CLOTH BEACON ORANGE TIMEOUT ST (SAFETY) IMPLANT
COVER PERINEAL POST (MISCELLANEOUS) ×3 IMPLANT
COVER SURGICAL LIGHT HANDLE (MISCELLANEOUS) ×3 IMPLANT
DECANTER SPIKE VIAL GLASS SM (MISCELLANEOUS) ×3 IMPLANT
DERMABOND ADVANCED (GAUZE/BANDAGES/DRESSINGS) ×4
DERMABOND ADVANCED .7 DNX12 (GAUZE/BANDAGES/DRESSINGS) ×2 IMPLANT
DRAPE SHEET LG 3/4 BI-LAMINATE (DRAPES) ×9 IMPLANT
DRAPE STERI IOBAN 125X83 (DRAPES) ×3 IMPLANT
DRAPE U-SHAPE 47X51 STRL (DRAPES) ×6 IMPLANT
DRSG AQUACEL AG ADV 3.5X10 (GAUZE/BANDAGES/DRESSINGS) ×3 IMPLANT
ELECT PENCIL ROCKER SW 15FT (MISCELLANEOUS) ×3 IMPLANT
ELECT REM PT RETURN 15FT ADLT (MISCELLANEOUS) ×3 IMPLANT
GAUZE SPONGE 4X4 12PLY STRL (GAUZE/BANDAGES/DRESSINGS) ×3 IMPLANT
GLOVE BIO SURGEON STRL SZ8.5 (GLOVE) ×6 IMPLANT
GLOVE BIOGEL PI IND STRL 8.5 (GLOVE) ×1 IMPLANT
GLOVE BIOGEL PI INDICATOR 8.5 (GLOVE) ×2
GOWN SPEC L3 XXLG W/TWL (GOWN DISPOSABLE) ×3 IMPLANT
HANDPIECE INTERPULSE COAX TIP (DISPOSABLE) ×2
HOLDER FOLEY CATH W/STRAP (MISCELLANEOUS) ×3 IMPLANT
HOOD PEEL AWAY FLYTE STAYCOOL (MISCELLANEOUS) ×6 IMPLANT
MARKER SKIN DUAL TIP RULER LAB (MISCELLANEOUS) ×3 IMPLANT
NEEDLE SPNL 18GX3.5 QUINCKE PK (NEEDLE) ×3 IMPLANT
PACK ANTERIOR HIP CUSTOM (KITS) ×3 IMPLANT
SAW OSC TIP CART 19.5X105X1.3 (SAW) ×3 IMPLANT
SEALER BIPOLAR AQUA 6.0 (INSTRUMENTS) ×3 IMPLANT
SET HNDPC FAN SPRY TIP SCT (DISPOSABLE) ×1 IMPLANT
SUT ETHIBOND NAB CT1 #1 30IN (SUTURE) ×6 IMPLANT
SUT MNCRL AB 3-0 PS2 18 (SUTURE) ×3 IMPLANT
SUT MON AB 2-0 CT1 36 (SUTURE) ×6 IMPLANT
SUT STRATAFIX PDO 1 14 VIOLET (SUTURE) ×2
SUT STRATFX PDO 1 14 VIOLET (SUTURE) ×1
SUT VIC AB 2-0 CT1 27 (SUTURE) ×2
SUT VIC AB 2-0 CT1 TAPERPNT 27 (SUTURE) ×1 IMPLANT
SUTURE STRATFX PDO 1 14 VIOLET (SUTURE) ×1 IMPLANT
SYR 50ML LL SCALE MARK (SYRINGE) ×3 IMPLANT
TRAY FOLEY CATH 14FRSI W/METER (CATHETERS) ×3 IMPLANT
TRAY FOLEY W/METER SILVER 16FR (SET/KITS/TRAYS/PACK) IMPLANT
WATER STERILE IRR 1000ML POUR (IV SOLUTION) ×6 IMPLANT
YANKAUER SUCT BULB TIP 10FT TU (MISCELLANEOUS) ×3 IMPLANT

## 2017-06-10 NOTE — Anesthesia Preprocedure Evaluation (Signed)
Anesthesia Evaluation  Patient identified by MRN, date of birth, ID band Patient awake    Reviewed: Allergy & Precautions, NPO status , Patient's Chart, lab work & pertinent test results  Airway Mallampati: I  TM Distance: >3 FB Neck ROM: Full    Dental   Pulmonary    Pulmonary exam normal        Cardiovascular + CAD and + Cardiac Stents  Normal cardiovascular exam     Neuro/Psych    GI/Hepatic GERD  Medicated and Controlled,  Endo/Other    Renal/GU      Musculoskeletal   Abdominal   Peds  Hematology   Anesthesia Other Findings   Reproductive/Obstetrics                             Anesthesia Physical Anesthesia Plan  ASA: II  Anesthesia Plan: Spinal   Post-op Pain Management:    Induction: Intravenous  PONV Risk Score and Plan: 2 and Treatment may vary due to age or medical condition and Ondansetron  Airway Management Planned: Simple Face Mask  Additional Equipment:   Intra-op Plan:   Post-operative Plan:   Informed Consent: I have reviewed the patients History and Physical, chart, labs and discussed the procedure including the risks, benefits and alternatives for the proposed anesthesia with the patient or authorized representative who has indicated his/her understanding and acceptance.     Plan Discussed with: CRNA and Surgeon  Anesthesia Plan Comments:         Anesthesia Quick Evaluation

## 2017-06-10 NOTE — Op Note (Signed)
OPERATIVE REPORT  SURGEON: Samson FredericBrian Klare Criss, MD   ASSISTANT: Skip MayerBlair Roberts, PA-C.  PREOPERATIVE DIAGNOSIS: Right hip arthritis.   POSTOPERATIVE DIAGNOSIS: Right hip arthritis.   PROCEDURE: Right total hip arthroplasty, anterior approach.   IMPLANTS: DePuy Tri Lock stem, size 7, std offset. DePuy Pinnacle Cup, size 50 mm. DePuy Altrx liner, size 32 by 50 mm, +4 neutral. DePuy Biolox ceramic head ball, size 32 + 5 mm.  ANESTHESIA:  Spinal  ESTIMATED BLOOD LOSS:-250 mL    ANTIBIOTICS: 2 g Ancef.  DRAINS: None.  COMPLICATIONS: None.   CONDITION: PACU - hemodynamically stable.   BRIEF CLINICAL NOTE: Kaitlyn Parks is a 82 y.o. female with a long-standing history of Right hip arthritis. After failing conservative management, the patient was indicated for total hip arthroplasty. The risks, benefits, and alternatives to the procedure were explained, and the patient elected to proceed.  PROCEDURE IN DETAIL: Surgical site was marked by myself in the pre-op holding area. Once inside the operating room, spinal anesthesia was obtained, and a foley catheter was inserted. The patient was then positioned on the Hana table. All bony prominences were well padded. The hip was prepped and draped in the normal sterile surgical fashion. A time-out was called verifying side and site of surgery. The patient received IV antibiotics within 60 minutes of beginning the procedure.  The direct anterior approach to the hip was performed through the Hueter interval. Lateral femoral circumflex vessels were treated with the Auqumantys. The anterior capsule was exposed and an inverted T capsulotomy was made.The femoral neck cut was made to the level of the templated cut. A corkscrew was placed into the head and the head was removed. The femoral head was found to have eburnated bone. The head was passed to the back table and was measured.  Acetabular exposure was achieved, and the pulvinar and labrum  were excised. Sequential reaming of the acetabulum was then performed up to a size 49 mm reamer. A 50 mm cup was then opened and impacted into place at approximately 40 degrees of abduction and 20 degrees of anteversion. The final polyethylene liner was impacted into place and acetabular osteophytes were removed.   I then gained femoral exposure taking care to protect the abductors and greater trochanter. This was performed using standard external rotation, extension, and adduction. The capsule was peeled off the inner aspect of the greater trochanter, taking care to preserve the short external rotators. A cookie cutter was used to enter the femoral canal, and then the femoral canal finder was placed. Sequential broaching was performed up to a size 7. Calcar planer was used on the femoral neck remnant. I placed a std offset neck and a trial head ball. The hip was reduced. Leg lengths and offset were checked fluoroscopically. The hip was dislocated and trial components were removed. The final implants were placed, and the hip was reduced.  Fluoroscopy was used to confirm component position and leg lengths. At 90 degrees of external rotation and full extension, the hip was stable to an anterior directed force.  The wound was copiously irrigated with normal saline using pulse lavage. Marcaine solution was injected into the periarticular soft tissue. The wound was closed in layers using #1 Vicryl and V-Loc for the fascia, 2-0 Vicryl for the subcutaneous fat, 2-0 Monocryl for the deep dermal layer, 3-0 running Monocryl subcuticular stitch, and Dermabond for the skin. Once the glue was fully dried, an Aquacell Ag dressing was applied. The patient was transported to the recovery  room in stable condition. Sponge, needle, and instrument counts were correct at the end of the case x2. The patient tolerated the procedure well and there were no known complications.  Please note that a surgical assistant  was a medical necessity for this procedure to perform it in a safe and expeditious manner. Assistant was necessary to provide appropriate retraction of vital neurovascular structures, to prevent femoral fracture, and to allow for anatomic placement of the prosthesis.

## 2017-06-10 NOTE — Transfer of Care (Signed)
Immediate Anesthesia Transfer of Care Note  Patient: Kaitlyn Parks  Procedure(s) Performed: RIGHT TOTAL HIP ARTHROPLASTY ANTERIOR APPROACH (Right Hip)  Patient Location: PACU  Anesthesia Type:MAC and Spinal  Level of Consciousness: awake, alert , oriented and patient cooperative  Airway & Oxygen Therapy: Patient Spontanous Breathing and Patient connected to face mask oxygen  Post-op Assessment: Report given to RN and Post -op Vital signs reviewed and stable  Post vital signs: Reviewed and stable  Last Vitals:  Vitals:   06/10/17 0721  BP: (!) 164/82  Pulse: 78  Resp: 16  Temp: 36.5 C  SpO2: 99%    Last Pain: There were no vitals filed for this visit.    Patients Stated Pain Goal: 3 (15/17/61 6073)  Complications: No apparent anesthesia complications

## 2017-06-10 NOTE — Anesthesia Postprocedure Evaluation (Signed)
Anesthesia Post Note  Patient: Kaitlyn BaltimoreBetty M Parks  Procedure(s) Performed: RIGHT TOTAL HIP ARTHROPLASTY ANTERIOR APPROACH (Right Hip)     Patient location during evaluation: PACU Anesthesia Type: Spinal Level of consciousness: oriented and awake and alert Pain management: pain level controlled Vital Signs Assessment: post-procedure vital signs reviewed and stable Respiratory status: spontaneous breathing, respiratory function stable and patient connected to nasal cannula oxygen Cardiovascular status: blood pressure returned to baseline and stable Postop Assessment: no headache, no backache and no apparent nausea or vomiting Anesthetic complications: no    Last Vitals:  Vitals:   06/10/17 1500 06/10/17 1530  BP: (!) 149/97 (!) 154/69  Pulse: 87 69  Resp: (!) 23 20  Temp: (!) 36.4 C (!) 36.4 C  SpO2: 100% 97%    Last Pain:  Vitals:   06/10/17 1500  PainSc: 0-No pain                 Geraldean Walen DAVID

## 2017-06-10 NOTE — Discharge Instructions (Signed)
°Dr. Mkenzie Dotts °Joint Replacement Specialist °[facility] °[address] °[place], [place] [address] °[phone] ° ° °TOTAL HIP REPLACEMENT POSTOPERATIVE DIRECTIONS ° ° ° °Hip Rehabilitation, Guidelines Following Surgery  ° °WEIGHT BEARING °Weight bearing as tolerated with assist device (walker, cane, etc) as directed, use it as long as suggested by your surgeon or therapist, typically at least 4-6 weeks. ° °The results of a hip operation are greatly improved after range of motion and muscle strengthening exercises. Follow all safety measures which are given to protect your hip. If any of these exercises cause increased pain or swelling in your joint, decrease the amount until you are comfortable again. Then slowly increase the exercises. Call your caregiver if you have problems or questions.  ° °HOME CARE INSTRUCTIONS  °Most of the following instructions are designed to prevent the dislocation of your new hip.  °Remove items at home which could result in a fall. This includes throw rugs or furniture in walking pathways.  °Continue medications as instructed at time of discharge. °· You may have some home medications which will be placed on hold until you complete the course of blood thinner medication. °· You may start showering once you are discharged home. Do not remove your dressing. °Do not put on socks or shoes without following the instructions of your caregivers.   °Sit on chairs with arms. Use the chair arms to help push yourself up when arising.  °Arrange for the use of a toilet seat elevator so you are not sitting low.  °· Walk with walker as instructed.  °You may resume a sexual relationship in one month or when given the OK by your caregiver.  °Use walker as long as suggested by your caregivers.  °You may put full weight on your legs and walk as much as is comfortable. °Avoid periods of inactivity such as sitting longer than an hour when not asleep. This helps prevent  blood clots.  °You may return to work once you are cleared by your surgeon.  °Do not drive a car for 6 weeks or until released by your surgeon.  °Do not drive while taking narcotics.  °Wear elastic stockings for two weeks following surgery during the day but you may remove then at night.  °Make sure you keep all of your appointments after your operation with all of your doctors and caregivers. You should call the office at the above phone number and make an appointment for approximately two weeks after the date of your surgery. °Please pick up a stool softener and laxative for home use as long as you are requiring pain medications. °· ICE to the affected hip every three hours for 30 minutes at a time and then as needed for pain and swelling. Continue to use ice on the hip for pain and swelling from surgery. You may notice swelling that will progress down to the foot and ankle.  This is normal after surgery.  Elevate the leg when you are not up walking on it.   °It is important for you to complete the blood thinner medication as prescribed by your doctor. °· Continue to use the breathing machine which will help keep your temperature down.  It is common for your temperature to cycle up and down following surgery, especially at night when you are not up moving around and exerting yourself.  The breathing machine keeps your lungs expanded and your temperature down. ° °RANGE OF MOTION AND STRENGTHENING EXERCISES  °These exercises are   designed to help you keep full movement of your hip joint. Follow your caregiver's or physical therapist's instructions. Perform all exercises about fifteen times, three times per day or as directed. Exercise both hips, even if you have had only one joint replacement. These exercises can be done on a training (exercise) mat, on the floor, on a table or on a bed. Use whatever works the best and is most comfortable for you. Use music or television while you are exercising so that the exercises  are a pleasant break in your day. This will make your life better with the exercises acting as a break in routine you can look forward to.  °Lying on your back, slowly slide your foot toward your buttocks, raising your knee up off the floor. Then slowly slide your foot back down until your leg is straight again.  °Lying on your back spread your legs as far apart as you can without causing discomfort.  °Lying on your side, raise your upper leg and foot straight up from the floor as far as is comfortable. Slowly lower the leg and repeat.  °Lying on your back, tighten up the muscle in the front of your thigh (quadriceps muscles). You can do this by keeping your leg straight and trying to raise your heel off the floor. This helps strengthen the largest muscle supporting your knee.  °Lying on your back, tighten up the muscles of your buttocks both with the legs straight and with the knee bent at a comfortable angle while keeping your heel on the floor.  ° °SKILLED REHAB INSTRUCTIONS: °If the patient is transferred to a skilled rehab facility following release from the hospital, a list of the current medications will be sent to the facility for the patient to continue.  When discharged from the skilled rehab facility, please have the facility set up the patient's Home Health Physical Therapy prior to being released. Also, the skilled facility will be responsible for providing the patient with their medications at time of release from the facility to include their pain medication and their blood thinner medication. If the patient is still at the rehab facility at time of the two week follow up appointment, the skilled rehab facility will also need to assist the patient in arranging follow up appointment in our office and any transportation needs. ° °MAKE SURE YOU:  °Understand these instructions.  °Will watch your condition.  °Will get help right away if you are not doing well or get worse. ° °Pick up stool softner and  laxative for home use following surgery while on pain medications. °Do not remove your dressing. °The dressing is waterproof--it is OK to take showers. °Continue to use ice for pain and swelling after surgery. °Do not use any lotions or creams on the incision until instructed by your surgeon. °Total Hip Protocol. ° ° °

## 2017-06-10 NOTE — Anesthesia Procedure Notes (Signed)
Procedure Name: MAC Date/Time: 06/10/2017 10:08 AM Performed by: Dione Booze, CRNA Pre-anesthesia Checklist: Patient identified, Emergency Drugs available, Suction available and Patient being monitored Patient Re-evaluated:Patient Re-evaluated prior to induction Oxygen Delivery Method: Simple face mask Placement Confirmation: positive ETCO2

## 2017-06-10 NOTE — Plan of Care (Signed)
  Education: Knowledge of the prescribed therapeutic regimen will improve 06/10/2017 1942 - Progressing by William Daltonarpenter, Morena Mckissack L, RN   Pain Management: Pain level will decrease with appropriate interventions 06/10/2017 1942 - Progressing by William Daltonarpenter, Jennifer Holland L, RN   Activity: Ability to avoid complications of mobility impairment will improve 06/10/2017 1942 - Progressing by William Daltonarpenter, Revel Stellmach L, RN

## 2017-06-10 NOTE — Anesthesia Procedure Notes (Signed)
Spinal  Start time: 06/10/2017 10:02 AM End time: 06/10/2017 10:05 AM Staffing Anesthesiologist: Arta Brucessey, Xzavior Reinig, MD Performed: anesthesiologist  Preanesthetic Checklist Completed: patient identified, surgical consent, pre-op evaluation, timeout performed, IV checked, risks and benefits discussed and monitors and equipment checked Spinal Block Patient position: sitting Prep: DuraPrep Patient monitoring: heart rate, cardiac monitor, continuous pulse ox and blood pressure Approach: right paramedian Location: L3-4 Injection technique: single-shot Needle Needle type: Pencan  Needle gauge: 24 G Needle length: 9 cm Needle insertion depth: 6 cm

## 2017-06-10 NOTE — Interval H&P Note (Signed)
History and Physical Interval Note:  06/10/2017 9:55 AM  Kaitlyn Parks  has presented today for surgery, with the diagnosis of Degenerative joint disease right hip  The various methods of treatment have been discussed with the patient and family. After consideration of risks, benefits and other options for treatment, the patient has consented to  Procedure(s) with comments: RIGHT TOTAL HIP ARTHROPLASTY ANTERIOR APPROACH (Right) - Needs RNFA as a surgical intervention .  The patient's history has been reviewed, patient examined, no change in status, stable for surgery.  I have reviewed the patient's chart and labs.  Questions were answered to the patient's satisfaction.     Zakarie Sturdivant, Cloyde ReamsBrian James

## 2017-06-11 LAB — CBC
HEMATOCRIT: 33.3 % — AB (ref 36.0–46.0)
Hemoglobin: 11 g/dL — ABNORMAL LOW (ref 12.0–15.0)
MCH: 30.1 pg (ref 26.0–34.0)
MCHC: 33 g/dL (ref 30.0–36.0)
MCV: 91.2 fL (ref 78.0–100.0)
Platelets: 231 10*3/uL (ref 150–400)
RBC: 3.65 MIL/uL — ABNORMAL LOW (ref 3.87–5.11)
RDW: 12.5 % (ref 11.5–15.5)
WBC: 9.6 10*3/uL (ref 4.0–10.5)

## 2017-06-11 LAB — BASIC METABOLIC PANEL
Anion gap: 7 (ref 5–15)
BUN: 13 mg/dL (ref 6–20)
CALCIUM: 8 mg/dL — AB (ref 8.9–10.3)
CHLORIDE: 110 mmol/L (ref 101–111)
CO2: 24 mmol/L (ref 22–32)
CREATININE: 0.63 mg/dL (ref 0.44–1.00)
GFR calc Af Amer: >60 mL/min (ref >60)
GFR calc non Af Amer: >60 mL/min (ref >60)
GLUCOSE: 121 mg/dL — AB (ref 65–99)
Potassium: 3.9 mmol/L (ref 3.5–5.1)
Sodium: 141 mmol/L (ref 135–145)

## 2017-06-11 MED ORDER — ASPIRIN 81 MG PO CHEW: 81.0000 mg | CHEWABLE_TABLET | Freq: Two times a day (BID) | ORAL | 1 refills | Status: DC

## 2017-06-11 MED ORDER — HYDROCODONE-ACETAMINOPHEN 5-325 MG PO TABS: 1.0000 | ORAL_TABLET | Freq: Four times a day (QID) | ORAL | 0 refills | Status: DC | PRN

## 2017-06-11 MED ORDER — SENNA 8.6 MG PO TABS: 2.0000 | ORAL_TABLET | Freq: Every day | ORAL | 0 refills | Status: DC

## 2017-06-11 MED ORDER — DOCUSATE SODIUM 100 MG PO CAPS: 100.0000 mg | ORAL_CAPSULE | Freq: Two times a day (BID) | ORAL | 1 refills | Status: DC

## 2017-06-11 MED ORDER — ONDANSETRON HCL 4 MG PO TABS: 4.0000 mg | ORAL_TABLET | Freq: Four times a day (QID) | ORAL | 0 refills | Status: DC | PRN

## 2017-06-11 NOTE — Progress Notes (Signed)
   Subjective:  Patient reports pain as mild to moderate.  Had pain control issues o/n. Had I&O cath last night.  Objective:   VITALS:   Vitals:   06/10/17 1842 06/10/17 2104 06/11/17 0034 06/11/17 0410  BP: (!) 155/75 (!) 157/71 133/60 (!) 166/72  Pulse: 75 87 82 60  Resp: 16 15 17 16   Temp: 97.7 F (36.5 C) 98.3 F (36.8 C) (!) 97.5 F (36.4 C) 98.2 F (36.8 C)  TempSrc: Tympanic Oral Oral Oral  SpO2: 97% 97% 98% 100%  Weight:      Height:       NAD ABD soft Sensation intact distally Intact pulses distally Dorsiflexion/Plantar flexion intact Incision: dressing C/D/I Compartment soft   Lab Results  Component Value Date   WBC 9.6 06/11/2017   HGB 11.0 (L) 06/11/2017   HCT 33.3 (L) 06/11/2017   MCV 91.2 06/11/2017   PLT 231 06/11/2017   BMET    Component Value Date/Time   NA 141 06/11/2017 0500   K 3.9 06/11/2017 0500   CL 110 06/11/2017 0500   CO2 24 06/11/2017 0500   GLUCOSE 121 (H) 06/11/2017 0500   BUN 13 06/11/2017 0500   CREATININE 0.63 06/11/2017 0500   CALCIUM 8.0 (L) 06/11/2017 0500   CALCIUM 9.0 07/01/2010 2255   GFRNONAA >60 06/11/2017 0500   GFRAA >60 06/11/2017 0500     Assessment/Plan: 1 Day Post-Op   Principal Problem:   Osteoarthritis of right hip   WBAT with walker ASA, SCDs, TEDs PT/OT PO pain control Dispo: d/c home with HEP today vs tomorrow if pain controlled, needs to void   Tyshell Ramberg, Cloyde ReamsBrian James 06/11/2017, 8:01 AM   Samson FredericBrian Weston Kallman, MD Cell [phone]

## 2017-06-11 NOTE — Evaluation (Signed)
Physical Therapy Evaluation Patient Details Name: Kaitlyn BaltimoreBetty M Schweikert MRN: [id number] DOB: 1935/04/29 Today's Date: 06/11/2017   History of Present Illness  R DATHA  Clinical Impression  The patient reported dizziness, BP 125/71-pre, 126/59- post. Plans Dc today after PT. Pt admitted with above diagnosis. Pt currently with functional limitations due to the deficits listed below (see PT Problem List).  Pt will benefit from skilled PT to increase their independence and safety with mobility to allow discharge to the venue listed below.       Follow Up Recommendations No PT follow up    Equipment Recommendations  None recommended by PT    Recommendations for Other Services       Precautions / Restrictions Precautions Precautions: Fall      Mobility  Bed Mobility Overal bed mobility: Needs Assistance Bed Mobility: Supine to Sit     Supine to sit: Min guard     General bed mobility comments: cues for technique, gradually able tio help self to sitting  Transfers Overall transfer level: Needs assistance Equipment used: Rolling walker (2 wheeled) Transfers: Sit to/from Stand Sit to Stand: Min assist         General transfer comment: cues for hand and right leg, cues to try to flex at the hip  Ambulation/Gait Ambulation/Gait assistance: Min assist Ambulation Distance (Feet): 200 Feet Assistive device: Rolling walker (2 wheeled) Gait Pattern/deviations: Step-through pattern;Antalgic     General Gait Details: cues for sequence  Stairs            Wheelchair Mobility    Modified Rankin (Stroke Patients Only)       Balance                                             Pertinent Vitals/Pain Pain Assessment: 0-10 Pain Score: 6  Pain Location: right thigh Pain Descriptors / Indicators: Aching;Spasm;Tightness Pain Intervention(s): Premedicated before session;Repositioned;Ice applied;Monitored during session    Home Living Family/patient  expects to be discharged to:: Private residence Living Arrangements: Alone;Children Available Help at Discharge: Family Type of Home: House Home Access: Stairs to enter Entrance Stairs-Rails: None Entrance Stairs-Number of Steps: 2 Home Layout: Multi-level;1/2 bath on main level;Bed/bath upstairs Home Equipment: Walker - 2 wheels;Cane - single point Additional Comments: has moved bed to first floor    Prior Function Level of Independence: Independent               Hand Dominance        Extremity/Trunk Assessment   Upper Extremity Assessment Upper Extremity Assessment: Overall WFL for tasks assessed    Lower Extremity Assessment Lower Extremity Assessment: RLE deficits/detail RLE Deficits / Details: able to advance leg    Cervical / Trunk Assessment Cervical / Trunk Assessment: Other exceptions Cervical / Trunk Exceptions: scoliotic  Communication   Communication: No difficulties  Cognition Arousal/Alertness: Awake/alert Behavior During Therapy: WFL for tasks assessed/performed Overall Cognitive Status: Within Functional Limits for tasks assessed                                        General Comments      Exercises Total Joint Exercises Ankle Circles/Pumps: AROM;Both;10 reps Quad Sets: AROM;Both;10 reps Short Arc Quad: AROM;Right;10 reps Heel Slides: AAROM;Right;10 reps Hip ABduction/ADduction: AAROM;Right;10 reps   Assessment/Plan  PT Assessment Patient needs continued PT services  PT Problem List Decreased strength;Decreased range of motion;Decreased knowledge of use of DME;Decreased activity tolerance;Decreased safety awareness;Decreased knowledge of precautions;Decreased mobility;Pain       PT Treatment Interventions DME instruction;Gait training;Stair training;Functional mobility training;Patient/family education;Therapeutic activities;Therapeutic exercise    PT Goals (Current goals can be found in the Care Plan section)  Acute  Rehab PT Goals Patient Stated Goal: to  walk PT Goal Formulation: With patient/family Time For Goal Achievement: 06/13/17 Potential to Achieve Goals: Good    Frequency 7X/week   Barriers to discharge        Co-evaluation               AM-PAC PT "6 Clicks" Daily Activity  Outcome Measure Difficulty turning over in bed (including adjusting bedclothes, sheets and blankets)?: A Little Difficulty moving from lying on back to sitting on the side of the bed? : A Little Difficulty sitting down on and standing up from a chair with arms (e.g., wheelchair, bedside commode, etc,.)?: A Little Help needed moving to and from a bed to chair (including a wheelchair)?: A Little Help needed walking in hospital room?: A Little Help needed climbing 3-5 steps with a railing? : A Little 6 Click Score: 18    End of Session   Activity Tolerance: Patient tolerated treatment well Patient left: in chair;with call bell/phone within reach;with family/visitor present Nurse Communication: Mobility status PT Visit Diagnosis: Difficulty in walking, not elsewhere classified (R26.2);Pain Pain - Right/Left: Right    Time: [id number] PT Time Calculation (min) (ACUTE ONLY): 49 min   Charges:     PT Treatments $Gait Training: 8-22 mins $Self Care/Home Management: 8-22   PT G CodesBlanchard Kelch:       Jayzon Taras PT [phone]   Rada HayHill, Onell Mcmath Elizabeth 06/11/2017, 11:12 AM

## 2017-06-11 NOTE — Progress Notes (Signed)
Physical Therapy Treatment Patient Details Name: Kaitlyn BaltimoreBetty M Ashraf MRN: [id number] DOB: 07/13/1935 Today's Date: 06/11/2017    History of Present Illness R DATHA    PT Comments    Ready for Dc. Cautioned patient to maintain RW for a few days.   Follow Up Recommendations  No PT follow up     Equipment Recommendations  None recommended by PT    Recommendations for Other Services       Precautions / Restrictions Precautions Precautions: Fall Precaution Comments: multiple reminders to not walk away from RW.    Mobility  Bed Mobility Overal bed mobility: Needs Assistance Bed Mobility: Supine to Sit;Sit to Supine       Sit to supine: Supervision   General bed mobility comments: uswed leg lifter to self assist the  rle onto bed and off.  Transfers   Equipment used: Rolling walker (2 wheeled) Transfers: Sit to/from Stand Sit to Stand: Supervision         General transfer comment: cues for hand and right leg, cues to try to flex at the hip  Ambulation/Gait Ambulation/Gait assistance: Min guard Ambulation Distance (Feet): 50 Feet Assistive device: Rolling walker (2 wheeled) Gait Pattern/deviations: Step-to pattern;Step-through pattern;Antalgic     General Gait Details: cues for sequence, cues to maintain hands on RW, walks away from RW.   Stairs Stairs: Yes   Stair Management: Step to pattern;One rail Left;With walker;With cane   General stair comments: Practiced backward with RW, forward with cane and rail.  practiced 2 and 3  x 2 Daughter present to assist Wheelchair Mobility    Modified Rankin (Stroke Patients Only)       Balance                                            Cognition Arousal/Alertness: Awake/alert                                            Exercises Total Joint Exercises Long Arc Quad: AROM    General Comments        Pertinent Vitals/Pain Pain Score: 4  Pain Location: right thigh Pain  Descriptors / Indicators: Aching;Spasm;Tightness Pain Intervention(s): Monitored during session;Premedicated before session;Ice applied    Home Living                      Prior Function            PT Goals (current goals can now be found in the care plan section) Progress towards PT goals: Progressing toward goals    Frequency    7X/week      PT Plan      Co-evaluation              AM-PAC PT "6 Clicks" Daily Activity  Outcome Measure  Difficulty turning over in bed (including adjusting bedclothes, sheets and blankets)?: A Little Difficulty moving from lying on back to sitting on the side of the bed? : A Little Difficulty sitting down on and standing up from a chair with arms (e.g., wheelchair, bedside commode, etc,.)?: A Little Help needed moving to and from a bed to chair (including a wheelchair)?: A Little Help needed walking in hospital room?: A Little Help needed climbing 3-5 steps  with a railing? : A Little 6 Click Score: 18    End of Session   Activity Tolerance: Patient tolerated treatment well Patient left: in chair;with call bell/phone within reach;with family/visitor present Nurse Communication: Mobility status PT Visit Diagnosis: Difficulty in walking, not elsewhere classified (R26.2);Pain Pain - Right/Left: Right     Time: 1457-1520 PT Time Calculation (min) (ACUTE ONLY): 23 min  Charges:  $Gait Training: 23-37 mins                    G Codes:       }   Rada HayHill, Lajuanna Pompa Elizabeth 06/11/2017, 4:41 PM

## 2017-06-11 NOTE — Discharge Summary (Signed)
Physician Discharge Summary  Patient ID: Kaitlyn Parks MRN: [id number] DOB/AGE: Dec 25, 1934 82 y.o.  Admit date: 06/10/2017 Discharge date: 06/11/2017  Admission Diagnoses:  Osteoarthritis of right hip  Discharge Diagnoses:  Principal Problem:   Osteoarthritis of right hip   Past Medical History:  Diagnosis Date  . Arthritis    DDD, scoliosis, sees Dr. Lovell SheehanJenkins for this, uses norco very rarely for pain  . CAD (coronary artery disease)    LAD stenting of a 90% lesion 2012  . Cystocele   . Elevated cholesterol   . GERD (gastroesophageal reflux disease)    dx in work up 2016 for atypical CP at OSH   pt. denies  . Heart murmur   . Hypothyroidism   . Interstitial cystitis    sees Dr. Annabell HowellsWrenn  . Osteoporosis   . Rectocele   . Scoliosis   . Thyroid disease    Hypothyroid  . Urinary incontinence    USI  . Uterine prolapse     Surgeries: Procedure(s): RIGHT TOTAL HIP ARTHROPLASTY ANTERIOR APPROACH on 06/10/2017   Consultants (if any):   Discharged Condition: Improved  Hospital Course: Kaitlyn Parks is an 82 y.o. female who was admitted 06/10/2017 with a diagnosis of Osteoarthritis of right hip and went to the operating room on 06/10/2017 and underwent the above named procedures.    She was given perioperative antibiotics:  Anti-infectives (From admission, onward)   Start     Dose/Rate Route Frequency Ordered Stop   06/10/17 1800  ceFAZolin (ANCEF) IVPB 1 g/50 mL premix     1 g 100 mL/hr over 30 Minutes Intravenous Every 6 hours 06/10/17 1658 06/11/17 0025   06/10/17 0723  ceFAZolin (ANCEF) IVPB 2g/100 mL premix     2 g 200 mL/hr over 30 Minutes Intravenous On call to O.R. 06/10/17 [id number] 06/10/17 1035    .  She was given sequential compression devices, early ambulation, and ASA for DVT prophylaxis.  She benefited maximally from the hospital stay and there were no complications.    Recent vital signs:  Vitals:   06/11/17 0410 06/11/17 0942  BP: (!) 166/72 (!) 149/62   Pulse: 60 73  Resp: 16 16  Temp: 98.2 F (36.8 C) 97.9 F (36.6 C)  SpO2: 100% 95%    Recent laboratory studies:  Lab Results  Component Value Date   HGB 11.0 (L) 06/11/2017   HGB 13.3 06/04/2017   HGB 13.8 04/30/2017   Lab Results  Component Value Date   WBC 9.6 06/11/2017   PLT 231 06/11/2017   Lab Results  Component Value Date   INR 0.89 04/25/2010   Lab Results  Component Value Date   NA 141 06/11/2017   K 3.9 06/11/2017   CL 110 06/11/2017   CO2 24 06/11/2017   BUN 13 06/11/2017   CREATININE 0.63 06/11/2017   GLUCOSE 121 (H) 06/11/2017    Discharge Medications:   Allergies as of 06/11/2017      Reactions   Acyclovir And Related    Pravastatin Sodium Other (See Comments)   cystitis   Zocor [simvastatin] Other (See Comments)   cystitis      Medication List    STOP taking these medications   denosumab 60 MG/ML Soln injection Commonly known as:  PROLIA     TAKE these medications   aspirin 81 MG tablet Take 81 mg by mouth daily. What changed:  Another medication with the same name was added. Make sure you understand how and when  to take each.   aspirin 81 MG chewable tablet Chew 1 tablet (81 mg total) 2 (two) times daily by mouth. What changed:  You were already taking a medication with the same name, and this prescription was added. Make sure you understand how and when to take each.   cycloSPORINE 0.05 % ophthalmic emulsion Commonly known as:  RESTASIS Place 1 drop 2 (two) times daily into both eyes.   docusate sodium 100 MG capsule Commonly known as:  COLACE Take 1 capsule (100 mg total) 2 (two) times daily by mouth.   EYE VITAMINS PO Take 1 tablet by mouth daily.   HAIR/SKIN/NAILS PO Take 1 tablet by mouth daily.   HYDROcodone-acetaminophen 5-325 MG tablet Commonly known as:  NORCO/VICODIN Take 1-2 tablets every 6 (six) hours as needed by mouth for moderate pain.   ketotifen 0.025 % ophthalmic solution Commonly known as:   ZADITOR Place 1 drop into both eyes 4 (four) times daily.   LIDOCAINE EX Apply 1 patch topically daily as needed (pain).   naproxen sodium 220 MG tablet Commonly known as:  ALEVE Take 220 mg by mouth daily as needed (pain).   nitroGLYCERIN 0.4 MG SL tablet Commonly known as:  NITROSTAT DISSOLVE 1 TABLET UNDER TONGUE IF NEEDED FOR CHEST PAIN. MAY REPEAT IN 5 MINUTES FOR 3 DOSES.   ondansetron 4 MG tablet Commonly known as:  ZOFRAN Take 1 tablet (4 mg total) every 6 (six) hours as needed by mouth for nausea.   ONE-A-DAY WOMENS PO Take 1 tablet by mouth daily.   rosuvastatin 10 MG tablet Commonly known as:  CRESTOR TAKE (1/2) TABLET DAILY.   senna 8.6 MG Tabs tablet Commonly known as:  SENOKOT Take 2 tablets (17.2 mg total) at bedtime by mouth.   SYNTHROID 50 MCG tablet Generic drug:  levothyroxine TAKE 1 TABLET EACH DAY.   triamcinolone cream 0.1 % Commonly known as:  KENALOG Apply 1 application topically 2 (two) times daily. What changed:    when to take this  reasons to take this            Durable Medical Equipment  (From admission, onward)        Start     Ordered   06/10/17 1659  DME Walker rolling  Once    Question:  Patient needs a walker to treat with the following condition  Answer:  Status post total hip replacement, right   06/10/17 1658   06/10/17 1659  DME 3 n 1  Once     06/10/17 1658      Diagnostic Studies: Dg Pelvis Portable  Result Date: 06/10/2017 CLINICAL DATA:  Post op right total hip replacement EXAM: PORTABLE PELVIS 1-2 VIEWS COMPARISON:  06/10/2017 FINDINGS: Status post right hip arthroplasty. No evidence for dislocation or interval fracture. Postoperative soft tissue gas identified in the right hip. IMPRESSION: Status post right hip arthroplasty. No evidence for acute abnormality. Electronically Signed   By: Norva PavlovElizabeth  Brown M.D.   On: 06/10/2017 12:42   Dg C-arm 1-60 Min-no Report  Result Date: 06/10/2017 Fluoroscopy was  utilized by the requesting physician.  No radiographic interpretation.   Dg Hip Operative Unilat With Pelvis Right  Result Date: 06/10/2017 CLINICAL DATA:  Status post right hip joint prosthesis placement. EXAM: OPERATIVE right HIP (WITH PELVIS IF PERFORMED) 1 VIEWS TECHNIQUE: Fluoroscopic spot image(s) were submitted for interpretation post-operatively. COMPARISON:  Preoperative right hip series of November 11, 2016 FINDINGS: The patient has undergone right hip joint prosthesis placement.  Radiographic positioning of the prosthetic components is good. The interface with the native bone appears normal. No acute native bone abnormality is observed. IMPRESSION: No immediate postprocedure complication following right hip joint prosthesis placement. Electronically Signed   By: David  [place]and M.D.   On: 06/10/2017 12:48    Disposition: 01-Home or Self Care  Discharge Instructions    Call MD / Call 911   Complete by:  As directed    If you experience chest pain or shortness of breath, CALL 911 and be transported to the hospital emergency room.  If you develope a fever above 101 F, pus (white drainage) or increased drainage or redness at the wound, or calf pain, call your surgeon's office.   Constipation Prevention   Complete by:  As directed    Drink plenty of fluids.  Prune juice may be helpful.  You may use a stool softener, such as Colace (over the counter) 100 mg twice a day.  Use MiraLax (over the counter) for constipation as needed.   Diet - low sodium heart healthy   Complete by:  As directed    Driving restrictions   Complete by:  As directed    No driving for 2 weeks   Increase activity slowly as tolerated   Complete by:  As directed    Lifting restrictions   Complete by:  As directed    No lifting for 2 weeks   TED hose   Complete by:  As directed    Use stockings (TED hose) for 2 weeks on both leg(s).  You may remove them at night for sleeping.      Follow-up Information    Makia Bossi,  Arlys John, MD. Schedule an appointment as soon as possible for a visit in 2 weeks.   Specialty:  Orthopedic Surgery Why:  For wound re-check Contact information: [address] [place] [address] [phone]            Signed: Garnet Koyanagi 06/11/2017, 4:50 PM

## 2017-06-11 NOTE — Progress Notes (Signed)
06/11/17 0330 Nursing Patient still unable to void. Bladder scanned for 303cc urine. I and O cath at this time. Will continue to monitor patient.

## 2017-06-12 ENCOUNTER — Emergency Department (HOSPITAL_COMMUNITY): Payer: Medicare Other

## 2017-06-12 ENCOUNTER — Encounter (HOSPITAL_COMMUNITY): Payer: Self-pay | Admitting: Emergency Medicine

## 2017-06-12 ENCOUNTER — Other Ambulatory Visit: Payer: Self-pay

## 2017-06-12 ENCOUNTER — Inpatient Hospital Stay (HOSPITAL_COMMUNITY)
Admission: EM | Admit: 2017-06-12 | Discharge: 2017-06-14 | DRG: 282 | Disposition: A | Discharge status: Home / Self Care | Payer: Medicare Other | Attending: Family Medicine | Admitting: Family Medicine

## 2017-06-12 DIAGNOSIS — I340 Nonrheumatic mitral (valve) insufficiency: Secondary | ICD-10-CM | POA: Diagnosis not present

## 2017-06-12 DIAGNOSIS — Z7982 Long term (current) use of aspirin: Secondary | ICD-10-CM | POA: Diagnosis not present

## 2017-06-12 DIAGNOSIS — I214 Non-ST elevation (NSTEMI) myocardial infarction: Secondary | ICD-10-CM | POA: Diagnosis present

## 2017-06-12 DIAGNOSIS — K219 Gastro-esophageal reflux disease without esophagitis: Secondary | ICD-10-CM | POA: Diagnosis present

## 2017-06-12 DIAGNOSIS — M1990 Unspecified osteoarthritis, unspecified site: Secondary | ICD-10-CM | POA: Diagnosis present

## 2017-06-12 DIAGNOSIS — R6884 Jaw pain: Secondary | ICD-10-CM | POA: Diagnosis present

## 2017-06-12 DIAGNOSIS — I10 Essential (primary) hypertension: Secondary | ICD-10-CM | POA: Diagnosis present

## 2017-06-12 DIAGNOSIS — E785 Hyperlipidemia, unspecified: Secondary | ICD-10-CM | POA: Diagnosis present

## 2017-06-12 DIAGNOSIS — I081 Rheumatic disorders of both mitral and tricuspid valves: Secondary | ICD-10-CM | POA: Diagnosis present

## 2017-06-12 DIAGNOSIS — R748 Abnormal levels of other serum enzymes: Secondary | ICD-10-CM | POA: Diagnosis not present

## 2017-06-12 DIAGNOSIS — I25110 Atherosclerotic heart disease of native coronary artery with unstable angina pectoris: Secondary | ICD-10-CM | POA: Diagnosis present

## 2017-06-12 DIAGNOSIS — Z96641 Presence of right artificial hip joint: Secondary | ICD-10-CM | POA: Diagnosis present

## 2017-06-12 DIAGNOSIS — I2510 Atherosclerotic heart disease of native coronary artery without angina pectoris: Secondary | ICD-10-CM | POA: Diagnosis present

## 2017-06-12 DIAGNOSIS — R001 Bradycardia, unspecified: Secondary | ICD-10-CM | POA: Diagnosis not present

## 2017-06-12 DIAGNOSIS — Z888 Allergy status to other drugs, medicaments and biological substances status: Secondary | ICD-10-CM

## 2017-06-12 DIAGNOSIS — I200 Unstable angina: Secondary | ICD-10-CM

## 2017-06-12 DIAGNOSIS — D649 Anemia, unspecified: Secondary | ICD-10-CM | POA: Diagnosis present

## 2017-06-12 DIAGNOSIS — M419 Scoliosis, unspecified: Secondary | ICD-10-CM | POA: Diagnosis present

## 2017-06-12 DIAGNOSIS — Z79890 Hormone replacement therapy (postmenopausal): Secondary | ICD-10-CM

## 2017-06-12 DIAGNOSIS — Z955 Presence of coronary angioplasty implant and graft: Secondary | ICD-10-CM

## 2017-06-12 DIAGNOSIS — M542 Cervicalgia: Secondary | ICD-10-CM

## 2017-06-12 DIAGNOSIS — E039 Hypothyroidism, unspecified: Secondary | ICD-10-CM | POA: Diagnosis present

## 2017-06-12 DIAGNOSIS — M810 Age-related osteoporosis without current pathological fracture: Secondary | ICD-10-CM | POA: Diagnosis present

## 2017-06-12 HISTORY — DX: Presence of right artificial hip joint: Z96.641

## 2017-06-12 LAB — BASIC METABOLIC PANEL
ANION GAP: 5 (ref 5–15)
BUN: 17 mg/dL (ref 6–20)
CHLORIDE: 106 mmol/L (ref 101–111)
CO2: 25 mmol/L (ref 22–32)
Calcium: 8.4 mg/dL — ABNORMAL LOW (ref 8.9–10.3)
Creatinine, Ser: 0.68 mg/dL (ref 0.44–1.00)
GFR calc non Af Amer: >60 mL/min (ref >60)
Glucose, Bld: 110 mg/dL — ABNORMAL HIGH (ref 65–99)
POTASSIUM: 3.7 mmol/L (ref 3.5–5.1)
Sodium: 136 mmol/L (ref 135–145)

## 2017-06-12 LAB — TROPONIN I
TROPONIN I: 0.34 ng/mL — AB (ref <0.03)
TROPONIN I: 1.68 ng/mL — AB (ref <0.03)
TROPONIN I: 1.76 ng/mL — AB (ref <0.03)
Troponin I: 2.18 ng/mL (ref <0.03)

## 2017-06-12 LAB — I-STAT TROPONIN, ED: TROPONIN I, POC: 0.42 ng/mL — AB (ref 0.00–0.08)

## 2017-06-12 LAB — CBC
HCT: 34.5 % — ABNORMAL LOW (ref 36.0–46.0)
HEMOGLOBIN: 11.2 g/dL — AB (ref 12.0–15.0)
MCH: 29.5 pg (ref 26.0–34.0)
MCHC: 32.5 g/dL (ref 30.0–36.0)
MCV: 90.8 fL (ref 78.0–100.0)
Platelets: 200 10*3/uL (ref 150–400)
RBC: 3.8 MIL/uL — AB (ref 3.87–5.11)
RDW: 12.7 % (ref 11.5–15.5)
WBC: 9.2 10*3/uL (ref 4.0–10.5)

## 2017-06-12 MED ORDER — ROSUVASTATIN CALCIUM 10 MG PO TABS
10.0000 mg | ORAL_TABLET | Freq: Every day | ORAL | Status: DC
  Administered 2017-06-12 – 2017-06-13 (×2): 10 mg via ORAL
  Filled 2017-06-12 (×2): qty 1

## 2017-06-12 MED ORDER — ENOXAPARIN SODIUM 40 MG/0.4ML ~~LOC~~ SOLN
40.0000 mg | Freq: Every day | SUBCUTANEOUS | Status: DC
  Administered 2017-06-12 – 2017-06-13 (×2): 40 mg via SUBCUTANEOUS
  Filled 2017-06-12 (×3): qty 0.4

## 2017-06-12 MED ORDER — LEVOTHYROXINE SODIUM 50 MCG PO TABS
50.0000 ug | ORAL_TABLET | Freq: Every day | ORAL | Status: DC
  Administered 2017-06-12 – 2017-06-13 (×2): 50 ug via ORAL
  Filled 2017-06-12 (×2): qty 1

## 2017-06-12 MED ORDER — ASPIRIN 81 MG PO CHEW
81.0000 mg | CHEWABLE_TABLET | Freq: Two times a day (BID) | ORAL | Status: DC
  Administered 2017-06-12 – 2017-06-13 (×4): 81 mg via ORAL
  Filled 2017-06-12 (×4): qty 1

## 2017-06-12 MED ORDER — METOPROLOL TARTRATE 12.5 MG HALF TABLET
12.5000 mg | ORAL_TABLET | Freq: Two times a day (BID) | ORAL | Status: DC
  Administered 2017-06-12: 12.5 mg via ORAL
  Filled 2017-06-12: qty 1

## 2017-06-12 MED ORDER — SENNA 8.6 MG PO TABS
2.0000 | ORAL_TABLET | Freq: Every day | ORAL | Status: DC
  Administered 2017-06-12 – 2017-06-13 (×2): 17.2 mg via ORAL
  Filled 2017-06-12 (×2): qty 2

## 2017-06-12 MED ORDER — ONDANSETRON HCL 4 MG/2ML IJ SOLN
4.0000 mg | Freq: Four times a day (QID) | INTRAMUSCULAR | Status: DC | PRN
  Administered 2017-06-13: 4 mg via INTRAVENOUS
  Filled 2017-06-12: qty 2

## 2017-06-12 MED ORDER — IOPAMIDOL (ISOVUE-370) INJECTION 76%
INTRAVENOUS | Status: AC
  Administered 2017-06-12: 100 mL
  Filled 2017-06-12: qty 100

## 2017-06-12 MED ORDER — METHOCARBAMOL 1000 MG/10ML IJ SOLN
500.0000 mg | Freq: Four times a day (QID) | INTRAVENOUS | Status: DC | PRN
  Filled 2017-06-12: qty 5

## 2017-06-12 MED ORDER — ACETAMINOPHEN 325 MG PO TABS
650.0000 mg | ORAL_TABLET | ORAL | Status: DC | PRN
  Administered 2017-06-13: 650 mg via ORAL
  Filled 2017-06-12: qty 2

## 2017-06-12 MED ORDER — DOCUSATE SODIUM 100 MG PO CAPS
100.0000 mg | ORAL_CAPSULE | Freq: Two times a day (BID) | ORAL | Status: DC
  Administered 2017-06-12 – 2017-06-14 (×5): 100 mg via ORAL
  Filled 2017-06-12 (×5): qty 1

## 2017-06-12 MED ORDER — HYDROCODONE-ACETAMINOPHEN 5-325 MG PO TABS
1.0000 | ORAL_TABLET | Freq: Four times a day (QID) | ORAL | Status: DC | PRN
  Administered 2017-06-12: 1 via ORAL
  Administered 2017-06-12 (×2): 2 via ORAL
  Administered 2017-06-13: 1 via ORAL
  Administered 2017-06-14: 2 via ORAL
  Filled 2017-06-12: qty 2
  Filled 2017-06-12: qty 1
  Filled 2017-06-12 (×2): qty 2
  Filled 2017-06-12: qty 1
  Filled 2017-06-12: qty 2

## 2017-06-12 MED ORDER — MORPHINE SULFATE (PF) 4 MG/ML IV SOLN: 1.0000 mg | INTRAVENOUS | Status: DC | PRN

## 2017-06-12 MED ORDER — MORPHINE SULFATE (PF) 4 MG/ML IV SOLN
1.0000 mg | INTRAVENOUS | Status: DC | PRN
  Administered 2017-06-12 – 2017-06-13 (×3): 1 mg via INTRAVENOUS
  Filled 2017-06-12 (×3): qty 1

## 2017-06-12 MED ORDER — ROSUVASTATIN CALCIUM 5 MG PO TABS: 5.0000 mg | ORAL_TABLET | Freq: Every day | ORAL | Status: DC

## 2017-06-12 MED ORDER — NITROGLYCERIN 0.4 MG SL SUBL
0.4000 mg | SUBLINGUAL_TABLET | SUBLINGUAL | Status: AC | PRN
  Administered 2017-06-12 (×3): 0.4 mg via SUBLINGUAL
  Filled 2017-06-12: qty 1

## 2017-06-12 MED ORDER — NITROGLYCERIN IN D5W 200-5 MCG/ML-% IV SOLN
0.0000 ug/min | INTRAVENOUS | Status: DC
  Administered 2017-06-12: 5 ug/min via INTRAVENOUS
  Administered 2017-06-13: 30 ug/min via INTRAVENOUS
  Filled 2017-06-12 (×3): qty 250

## 2017-06-12 MED ORDER — CYCLOSPORINE 0.05 % OP EMUL
1.0000 [drp] | Freq: Two times a day (BID) | OPHTHALMIC | Status: DC
  Administered 2017-06-12 – 2017-06-14 (×5): 1 [drp] via OPHTHALMIC
  Filled 2017-06-12 (×6): qty 1

## 2017-06-12 MED ORDER — METHOCARBAMOL 500 MG PO TABS
500.0000 mg | ORAL_TABLET | Freq: Three times a day (TID) | ORAL | Status: DC
  Administered 2017-06-12 – 2017-06-14 (×6): 500 mg via ORAL
  Filled 2017-06-12 (×6): qty 1

## 2017-06-12 MED ORDER — METOPROLOL TARTRATE 25 MG PO TABS
25.0000 mg | ORAL_TABLET | Freq: Two times a day (BID) | ORAL | Status: DC
  Administered 2017-06-12 – 2017-06-14 (×3): 25 mg via ORAL
  Filled 2017-06-12 (×3): qty 1

## 2017-06-12 NOTE — ED Notes (Signed)
Patient transported to X-ray 

## 2017-06-12 NOTE — ED Notes (Signed)
Patient transported to CT 

## 2017-06-12 NOTE — ED Notes (Signed)
Admitting NP at bedside

## 2017-06-12 NOTE — Consult Note (Signed)
CARDIOLOGY CONSULT NOTE   Referring Physician: Dr. Elesa Massed Primary Physician:  Primary Cardiologist:Dr. Antoine Poche Reason for Consultation: Elevated troponin with R neck and jaw pain  HPI:  Patient is a relatively healthy 82 y/o caucasian female with a pmhx of CAD s/p LAD stent placement in 04/2010 (per patient), HTN, HLP, Hypothyroidism who comes in after a recent total right hip arthroplasty with sudden onset of R side neck pain with radiation of the pain to the R jaw. Pain started around 1 am this morning. Prior to the pain, patient was experiencing nausea which she attributes to the hydrocodone she took for the hip pain. Her neck and jaw pain continued to worsen hence she came to the ER with her daughter for further evaluation. On arrival she was given IV fentanyl which improved her pain. She had a CTA chest which was negative for PE, but troponin was found to be elevated at 0.42 and hence cardiology was consulted for further recommendations.   Patient denies any chest pain or shortness of breath currently. She also denies any dizziness or headaches. She cannot comment on any exertional symptoms as she was unable to perform much activity given the hip pain. She hasn't had any further testing done other the stress echo in 2016, which was negative. She currently is tired and dozing off, with minimal R sided neck or jaw pain. To be patient noted no benefit of discomfort with sublingual NTG.  Review of Systems:     Cardiac Review of Systems: {Y] = yes [ ]  = no  Chest Pain [    ]  Resting SOB [   ] Exertional SOB  [  ]  Orthopnea [  ]   Pedal Edema [   ]    Palpitations [  ] Syncope  [  ]   Presyncope [  ]  General Review of Systems: [Y] = yes [  ]=no Constitional: recent weight change [  ]; anorexia [ Y ]; fatigue [  ]; nausea [] ; night sweats [  ]; fever [  ]; or chills [  ];                                                                     Eyes : blurred vision [  ]; diplopia [   ]; vision  changes [  ];  Amaurosis fugax[  ]; Resp: cough [  ];  wheezing[  ];  hemoptysis[  ];  PND [  ];  GI:  gallstones[  ], vomiting[  ];  dysphagia[  ]; melena[  ];  hematochezia [  ]; heartburn[  ];   GU: kidney stones [  ]; hematuria[  ];   dysuria [  ];  nocturia[  ]; incontinence [  ];             Skin: rash, swelling[  ];, hair loss[  ];  peripheral edema[  ];  or itching[  ]; Musculosketetal: myalgias[  ];  joint swelling[  ];  joint erythema[  ];  joint pain[  ];  back pain[  ];  Heme/Lymph: bruising[  ];  bleeding[  ];  anemia[  ];  Neuro: TIA[  ];  headaches[  ];  stroke[  ];  vertigo[  ];  seizures[  ];   paresthesias[  ];  difficulty walking[  ];  Psych:depression[  ]; anxiety[  ];  Endocrine: diabetes[  ];  thyroid dysfunction[  ];  Other:  Past Medical History:  Diagnosis Date  . Arthritis    DDD, scoliosis, sees Dr. Lovell SheehanJenkins for this, uses norco very rarely for pain  . CAD (coronary artery disease)    LAD stenting of a 90% lesion 2012  . Cystocele   . Elevated cholesterol   . GERD (gastroesophageal reflux disease)    dx in work up 2016 for atypical CP at OSH   pt. denies  . Heart murmur   . Hypothyroidism   . Interstitial cystitis    sees Dr. Annabell HowellsWrenn  . Osteoporosis   . Rectocele   . Scoliosis   . Thyroid disease    Hypothyroid  . Urinary incontinence    USI  . Uterine prolapse      (Not in a hospital admission)     Infusions: . nitroGLYCERIN      Allergies  Allergen Reactions  . Acyclovir And Related Other (See Comments)    unknown  . Pravastatin Sodium Other (See Comments)    cystitis  . Zocor [Simvastatin] Other (See Comments)    cystitis    Social History   Socioeconomic History  . Marital status: Widowed    Spouse name: Not on file  . Number of children: Not on file  . Years of education: Not on file  . Highest education level: Not on file  Social Needs  . Financial resource strain: Not on file  . Food insecurity - worry: Not on file  .  Food insecurity - inability: Not on file  . Transportation needs - medical: Not on file  . Transportation needs - non-medical: Not on file  Occupational History  . Not on file  Tobacco Use  . Smoking status: Never Smoker  . Smokeless tobacco: Never Used  Substance and Sexual Activity  . Alcohol use: No    Alcohol/week: 0.0 oz    Comment: Rare  . Drug use: No  . Sexual activity: No    Birth control/protection: Surgical  Other Topics Concern  . Not on file  Social History Narrative   Work or School:  none      Home Situation: lives with husband      Spiritual Beliefs: [employer]      Lifestyle: regular exercise (yoga, water aerobics); diet is healthy       Family History  Problem Relation Age of Onset  . Hypertension Mother   . Heart disease Mother   . Heart disease Father   . Heart disease Sister   . Colon cancer Paternal Aunt 175  . Breast cancer Paternal Aunt        Age 80's  . Diabetes Sister   . Endometrial cancer Sister   . Breast cancer Cousin        Maternal 1st cousins-Age 350's  . Leukemia Paternal Aunt    PHYSICAL EXAM: Vitals:   06/12/17 0630 06/12/17 0645  BP: 130/64 126/60  Pulse: 73 78  Resp: 10 10  Temp:    SpO2: 97% 97%   No intake or output data in the 24 hours ending 06/12/17 0722  General:  Well appearing. No respiratory difficulty HEENT: normal Neck: supple. no JVD.  Cor: PMI nondisplaced. Regular rate & rhythm. Systolic ejection murmur 3/6 at the base. Lungs: clear Abdomen: soft, nontender, nondistended. No  hepatosplenomegaly. No bruits or masses. Good bowel sounds. Extremities: no cyanosis, clubbing, rash, edema. R posterior neck tenderness on palpation. Neuro: alert & oriented x 3, cranial nerves grossly intact. moves all 4 extremities w/o difficulty, but limited at the right hip. Affect pleasant.   ECG:  Results for orders placed or performed during the hospital encounter of 06/12/17 (from the past 24 hour(s))  Basic metabolic  panel     Status: Abnormal   Collection Time: 06/12/17  4:28 AM  Result Value Ref Range   Sodium 136 135 - 145 mmol/L   Potassium 3.7 3.5 - 5.1 mmol/L   Chloride 106 101 - 111 mmol/L   CO2 25 22 - 32 mmol/L   Glucose, Bld 110 (H) 65 - 99 mg/dL   BUN 17 6 - 20 mg/dL   Creatinine, Ser 1.610.68 0.44 - 1.00 mg/dL   Calcium 8.4 (L) 8.9 - 10.3 mg/dL   GFR calc non Af Amer >60 >60 mL/min   GFR calc Af Amer >60 >60 mL/min   Anion gap 5 5 - 15  CBC     Status: Abnormal   Collection Time: 06/12/17  4:28 AM  Result Value Ref Range   WBC 9.2 4.0 - 10.5 K/uL   RBC 3.80 (L) 3.87 - 5.11 MIL/uL   Hemoglobin 11.2 (L) 12.0 - 15.0 g/dL   HCT 09.634.5 (L) 04.536.0 - 40.946.0 %   MCV 90.8 78.0 - 100.0 fL   MCH 29.5 26.0 - 34.0 pg   MCHC 32.5 30.0 - 36.0 g/dL   RDW 81.112.7 91.411.5 - 78.215.5 %   Platelets 200 150 - 400 K/uL  Troponin I     Status: Abnormal   Collection Time: 06/12/17  4:28 AM  Result Value Ref Range   Troponin I 0.34 (HH) <0.03 ng/mL  I-stat troponin, ED     Status: Abnormal   Collection Time: 06/12/17  4:40 AM  Result Value Ref Range   Troponin i, poc 0.42 (HH) 0.00 - 0.08 ng/mL   Comment NOTIFIED PHYSICIAN    Comment 3           Dg Chest 2 View  Result Date: 06/12/2017 CLINICAL DATA:  Acute onset of jaw and neck pain. EXAM: CHEST  2 VIEW COMPARISON:  Chest radiograph performed 01/24/2013 FINDINGS: The lungs are hypoexpanded. Mild bibasilar atelectasis is noted. There is no evidence of pleural effusion or pneumothorax. The heart is normal in size; the mediastinal contour is within normal limits. No acute osseous abnormalities are seen. IMPRESSION: Lungs hypoexpanded, with mild bibasilar atelectasis. Electronically Signed   By: Roanna RaiderJeffery  Chang M.D.   On: 06/12/2017 05:06   Ct Angio Chest Pe W And/or Wo Contrast  Result Date: 06/12/2017 CLINICAL DATA:  Acute onset RIGHT jaw and neck pain, nausea at 0130 hours. History of hip surgery this than 1 week ago, scoliosis. EXAM: CT ANGIOGRAPHY CHEST WITH  CONTRAST TECHNIQUE: Multidetector CT imaging of the chest was performed using the standard protocol during bolus administration of intravenous contrast. Multiplanar CT image reconstructions and MIPs were obtained to evaluate the vascular anatomy. CONTRAST:  100mL ISOVUE-370 IOPAMIDOL (ISOVUE-370) INJECTION 76% COMPARISON:  Chest radiograph June 12, 2017 at 0447 hours FINDINGS: CARDIOVASCULAR: Adequate contrast opacification of the pulmonary artery's. Main pulmonary artery is not enlarged. No pulmonary arterial filling defects to the level of the subsegmental branches. Heart size is mildly enlarged, no right heart strain. No pericardial effusion. Thoracic aorta is normal course and caliber, mild calcific atherosclerosis aortic arch. MEDIASTINUM/NODES: No  lymphadenopathy by CT size criteria. LUNGS/PLEURA: Tracheobronchial tree is patent, no pneumothorax. No pleural effusions, focal consolidations, pulmonary nodules or masses. Dependent atelectasis. Bandlike atelectasis RIGHT lower lobe. UPPER ABDOMEN: Nonacute. 11 mm benign LEFT adrenal adenoma (0 Hounsfield units). MUSCULOSKELETAL: Nonacute. RIGHT breast coarse calcification. Upper thoracic levoscoliosis. Moderate degenerative change of the thoracic spine. Review of the MIP images confirms the above findings. IMPRESSION: 1. No acute pulmonary embolism. 2. Mild cardiomegaly.  Atelectasis. Aortic Atherosclerosis (ICD10-I70.0). Electronically Signed   By: Awilda Metro M.D.   On: 06/12/2017 06:10   Dg Pelvis Portable  Result Date: 06/10/2017 CLINICAL DATA:  Post op right total hip replacement EXAM: PORTABLE PELVIS 1-2 VIEWS COMPARISON:  06/10/2017 FINDINGS: Status post right hip arthroplasty. No evidence for dislocation or interval fracture. Postoperative soft tissue gas identified in the right hip. IMPRESSION: Status post right hip arthroplasty. No evidence for acute abnormality. Electronically Signed   By: Norva Pavlov M.D.   On: 06/10/2017 12:42    Dg C-arm 1-60 Min-no Report  Result Date: 06/10/2017 Fluoroscopy was utilized by the requesting physician.  No radiographic interpretation.   Dg Hip Operative Unilat With Pelvis Right  Result Date: 06/10/2017 CLINICAL DATA:  Status post right hip joint prosthesis placement. EXAM: OPERATIVE right HIP (WITH PELVIS IF PERFORMED) 1 VIEWS TECHNIQUE: Fluoroscopic spot image(s) were submitted for interpretation post-operatively. COMPARISON:  Preoperative right hip series of November 11, 2016 FINDINGS: The patient has undergone right hip joint prosthesis placement. Radiographic positioning of the prosthetic components is good. The interface with the native bone appears normal. No acute native bone abnormality is observed. IMPRESSION: No immediate postprocedure complication following right hip joint prosthesis placement. Electronically Signed   By: David  [place]and M.D.   On: 06/10/2017 12:48   ASSESSMENT:  Elevated troponin s/p R total hip arthroplasty with associated R neck pain R total hip arthroplasty on Thursday at [facility]. Anemia post op, 2 units below baseline. CAD s/p LAD stent placed in 2011 of Sep? Per patient.  HTN, HLP Hypothyroidism   PLAN/DISCUSSION:  Will continue with medical treatment. Initiate asa, lopressor 12.5mg , BID, high intensity statin therapy. Will hold on initiating heparin gtt for now unless troponin's increase further or wall motion abnormality is noted on echo. No EKG changes for an acute MI. EKG looks similar to her old one in 2014. This may be a troponin leak post surgery.  Will continue to trend troponin's and obtain a 2D echo.  Please keep NPO until further plan is determined after the 2nd set of trops have been obtained and an echo has been performed. This could be related to a post op event, but her presentation is atypical. Will exclude alternative causes for troponin elevation and exclude a DVT. If as mentioned above deemed to be a true NSTEMI will consider  repeat heart catheterization.  Patient had a stress echo in 2016 where she walked for 3 min 18 seconds and had no regional wall motion changes on the echo. Pre-op risk stratification was done by Dr. Antoine Poche on 01/2017 and no further testing was recommended prior to surgery as patient remained stable.   Initiate DVT prophylaxis.  Utilize ntg if pain returns and please page cardiology.   Halina Andreas Cardiology fellow

## 2017-06-12 NOTE — ED Triage Notes (Signed)
Pt BIB EMS from home. Pt reports sudden onset of R jaw, neck pain with nausea around 0130 this morning. Denies CP, SOB. Hx of stent placement (2012) and recent hip surgery (past Thursday). Pt took 325mg  ASA prior to EMS arrival. Given 100mcg Fentanyl and 4mg  Zofran by EMS en route.

## 2017-06-12 NOTE — Progress Notes (Signed)
Progress Note  Patient Name: Kaitlyn BaltimoreBetty M Aicher Date of Encounter: 06/12/2017  Primary Cardiologist: Dr. Antoine PocheHochrein  Subjective   The patient has had no further jaw pain since admission.    Inpatient Medications    Scheduled Meds: . aspirin  81 mg Oral BID  . cycloSPORINE  1 drop Both Eyes BID  . docusate sodium  100 mg Oral BID  . enoxaparin (LOVENOX) injection  40 mg Subcutaneous Daily  . levothyroxine  50 mcg Oral QAC breakfast  . metoprolol tartrate  12.5 mg Oral BID  . rosuvastatin  10 mg Oral q1800  . senna  2 tablet Oral QHS   Continuous Infusions: . methocarbamol (ROBAXIN)  IV    . nitroGLYCERIN 20 mcg/min (06/12/17 0814)   PRN Meds: acetaminophen, HYDROcodone-acetaminophen, methocarbamol (ROBAXIN)  IV, morphine injection, ondansetron (ZOFRAN) IV   Vital Signs    Vitals:   06/12/17 0815 06/12/17 0830 06/12/17 1000 06/12/17 1208  BP: (!) 122/59 111/62 137/77   Pulse: 71 69 75   Resp: 12 12 (!) 23   Temp:   98.3 F (36.8 C) 97.7 F (36.5 C)  TempSrc:   Oral Oral  SpO2: 96% 96% 98%   Weight:   125 lb 16 oz (57.2 kg)   Height:   5\' 4"  (1.626 m)     Intake/Output Summary (Last 24 hours) at 06/12/2017 1412 Last data filed at 06/12/2017 1100 Gross per 24 hour  Intake 18.25 ml  Output -  Net 18.25 ml   Filed Weights   06/12/17 0423 06/12/17 1000  Weight: 126 lb (57.2 kg) 125 lb 16 oz (57.2 kg)    Telemetry    NA - Personally Reviewed  ECG    NA - Personally Reviewed  Physical Exam   GEN: No acute distress.   Cardiac: RRR, no murmurs, rubs, or gallops.  Respiratory: Clear  to auscultation bilaterally. GI: Soft, nontender, non-distended  MS: No edema; No deformity.  Right hip bandaged.   Neuro:  Nonfocal  Psych: Normal affect   Labs    Chemistry Recent Labs  Lab 06/11/17 0500 06/12/17 0428  NA 141 136  K 3.9 3.7  CL 110 106  CO2 24 25  GLUCOSE 121* 110*  BUN 13 17  CREATININE 0.63 0.68  CALCIUM 8.0* 8.4*  GFRNONAA >60 >60  GFRAA  >60 >60  ANIONGAP 7 5     Hematology Recent Labs  Lab 06/11/17 0500 06/12/17 0428  WBC 9.6 9.2  RBC 3.65* 3.80*  HGB 11.0* 11.2*  HCT 33.3* 34.5*  MCV 91.2 90.8  MCH 30.1 29.5  MCHC 33.0 32.5  RDW 12.5 12.7  PLT 231 200    Cardiac Enzymes Recent Labs  Lab 06/12/17 0428 06/12/17 0912  TROPONINI 0.34* 1.68*    Recent Labs  Lab 06/12/17 0440  TROPIPOC 0.42*     BNPNo results for input(s): BNP, PROBNP in the last 168 hours.   DDimer No results for input(s): DDIMER in the last 168 hours.   Radiology    Dg Chest 2 View  Result Date: 06/12/2017 CLINICAL DATA:  Acute onset of jaw and neck pain. EXAM: CHEST  2 VIEW COMPARISON:  Chest radiograph performed 01/24/2013 FINDINGS: The lungs are hypoexpanded. Mild bibasilar atelectasis is noted. There is no evidence of pleural effusion or pneumothorax. The heart is normal in size; the mediastinal contour is within normal limits. No acute osseous abnormalities are seen. IMPRESSION: Lungs hypoexpanded, with mild bibasilar atelectasis. Electronically Signed   By: Leotis ShamesJeffery  Chang M.D.   On: 06/12/2017 05:06   Ct Angio Chest Pe W And/or Wo Contrast  Result Date: 06/12/2017 CLINICAL DATA:  Acute onset RIGHT jaw and neck pain, nausea at 0130 hours. History of hip surgery this than 1 week ago, scoliosis. EXAM: CT ANGIOGRAPHY CHEST WITH CONTRAST TECHNIQUE: Multidetector CT imaging of the chest was performed using the standard protocol during bolus administration of intravenous contrast. Multiplanar CT image reconstructions and MIPs were obtained to evaluate the vascular anatomy. CONTRAST:  100mL ISOVUE-370 IOPAMIDOL (ISOVUE-370) INJECTION 76% COMPARISON:  Chest radiograph June 12, 2017 at 0447 hours FINDINGS: CARDIOVASCULAR: Adequate contrast opacification of the pulmonary artery's. Main pulmonary artery is not enlarged. No pulmonary arterial filling defects to the level of the subsegmental branches. Heart size is mildly enlarged, no right  heart strain. No pericardial effusion. Thoracic aorta is normal course and caliber, mild calcific atherosclerosis aortic arch. MEDIASTINUM/NODES: No lymphadenopathy by CT size criteria. LUNGS/PLEURA: Tracheobronchial tree is patent, no pneumothorax. No pleural effusions, focal consolidations, pulmonary nodules or masses. Dependent atelectasis. Bandlike atelectasis RIGHT lower lobe. UPPER ABDOMEN: Nonacute. 11 mm benign LEFT adrenal adenoma (0 Hounsfield units). MUSCULOSKELETAL: Nonacute. RIGHT breast coarse calcification. Upper thoracic levoscoliosis. Moderate degenerative change of the thoracic spine. Review of the MIP images confirms the above findings. IMPRESSION: 1. No acute pulmonary embolism. 2. Mild cardiomegaly.  Atelectasis. Aortic Atherosclerosis (ICD10-I70.0). Electronically Signed   By: Awilda Metroourtnay  Bloomer M.D.   On: 06/12/2017 06:10    Cardiac Studies   ECHO pending.  Patient Profile     82 y.o. female presented with elevated troponin post hip surgery.  Had jaw pain.    No acute EKG changes  Assessment & Plan    NSTEMI:  She has a slight troponin elevation.  No ongoing symptoms and no acute EKG changes.  She would be high risk for heparin at this point.  Continue ASA and beta blocker .  Check echo.  I will increase the dose of the beta blocker.    Signed, Rollene RotundaJames Nehal Shives, MD  06/12/2017, 2:12 PM

## 2017-06-12 NOTE — H&P (Signed)
History and Physical    Kaitlyn Parks Ruperto ONG:[id number] DOB: Jan 01, 1935 DOA: 06/12/2017   PCP: Terressa KoyanagiKim, [name], DO   Attending physician: Mariea ClontsEmokpae  Patient coming from/Resides with: Private residence  Chief Complaint: Right jaw pain with nausea  HPI: Kaitlyn Parks Fitting is a 82 y.o. female with medical history significant for known CAD with prior stent to LAD in 2012, this lipidemia, hypothyroidism, osteoarthritis with recent right total hip arthroplasty on 11/8 and discharged from the hospital on 11/9.  Patient reports that around 1 AM she got up to go to the bathroom.  At 1:30 AM her family assisted her back to bed.  After going back to bed patient developed right jaw discomfort that apparently originated from the posterior occiput into the jaw and radiated down into the right neck.  This was associated with nausea but no shortness of breath, diaphoresis or chest pain.  Daughter did notice that the patient was pale and the patient apparently reported dizziness as well.  The patient attempted to go to sleep about 3 AM but her symptoms persisted so EMS was called to the home.  Patient was given 50 mcg of fentanyl with near resolution of symptoms.  Her initial EKG was unchanged but she did have mild elevation in troponin of 0.3 and 0.4 range.  She was given 3 sublingual nitroglycerin and because her pain was not resolved she was started on a nitroglycerin infusion.  She has been evaluated by the cardiology fellow who recommended continue evaluation for chest pain and possible unstable angina.  Because of recent hip surgery he opted to not pursue full dose anticoagulation.  Note, because of recent hip surgery CTA of the chest was ordered and a PE was ruled out.  ED Course:  Vital Signs: BP (!) 115/54   Pulse 69   Temp 98.2 F (36.8 C) (Oral)   Resp 11   Ht 5\' 4"  (1.626 Parks)   Wt 57.2 kg (126 lb)   SpO2 96%   BMI 21.63 kg/Parks  CTA chest: No PE PCXR; negative Lab data: Sodium 136, potassium 3.7,  chloride 106, CO2 25, glucose 110, BUN 17, creatinine 0.68, troponin 0 0.42, troponin 0 0.34, white count 9200 differential not obtained, hemoglobin 11.2, platelets 200,000 Medications and treatments: Sublingual nitroglycerin 0.4 mg x3, nitroglycerin infusion titrated  Review of Systems:  In addition to the HPI above,  No Fever-chills, myalgias or other constitutional symptoms No Headache, changes with Vision or hearing, new weakness, tingling, numbness in any extremity, dysarthria or word finding difficulty, gait disturbance or imbalance, tremors or seizure activity No problems swallowing food or Liquids, indigestion/reflux, choking or coughing while eating, abdominal pain with or after eating No Cough or Shortness of Breath, palpitations, orthopnea or DOE No Abdominal pain, N/V, melena,hematochezia, dark tarry stools, constipation No dysuria, malodorous urine, hematuria or flank pain No new skin rashes, lesions, masses  No recent unintentional weight gain or loss No polyuria, polydypsia or polyphagia   Past Medical History:  Diagnosis Date  . Arthritis    DDD, scoliosis, sees Dr. Lovell SheehanJenkins for this, uses norco very rarely for pain  . CAD (coronary artery disease)    LAD stenting of a 90% lesion 2012  . Cystocele   . Elevated cholesterol   . GERD (gastroesophageal reflux disease)    dx in work up 2016 for atypical CP at OSH   pt. denies  . Heart murmur   . Hypothyroidism   . Interstitial cystitis    sees  Dr. [name]  . Osteoporosis   . Rectocele   . Scoliosis   . Thyroid disease    Hypothyroid  . Urinary incontinence    USI  . Uterine prolapse     Past Surgical History:  Procedure Laterality Date  . BLADDER SUSPENSION  2011  . CORONARY ANGIOPLASTY WITH STENT PLACEMENT  2012  . OOPHORECTOMY  2011   BSO  . VAGINAL HYSTERECTOMY  2011   LAVH BSO    Social History   Socioeconomic History  . Marital status: Widowed    Spouse name: Not on file  . Number of children: Not  on file  . Years of education: Not on file  . Highest education level: Not on file  Social Needs  . Financial resource strain: Not on file  . Food insecurity - worry: Not on file  . Food insecurity - inability: Not on file  . Transportation needs - medical: Not on file  . Transportation needs - non-medical: Not on file  Occupational History  . Not on file  Tobacco Use  . Smoking status: Never Smoker  . Smokeless tobacco: Never Used  Substance and Sexual Activity  . Alcohol use: No    Alcohol/week: 0.0 oz    Comment: Rare  . Drug use: No  . Sexual activity: No    Birth control/protection: Surgical  Other Topics Concern  . Not on file  Social History Narrative   Work or School:  none      Home Situation: lives with husband      Spiritual Beliefs: [employer]      Lifestyle: regular exercise (yoga, water aerobics); diet is healthy       Mobility: Currently requiring a rolling walker postoperatively Work history: Not obtained   Allergies  Allergen Reactions  . Acyclovir And Related Other (See Comments)    unknown  . Pravastatin Sodium Other (See Comments)    cystitis  . Zocor [Simvastatin] Other (See Comments)    cystitis    Family History  Problem Relation Age of Onset  . Hypertension Mother   . Heart disease Mother   . Heart disease Father   . Heart disease Sister   . Colon cancer Paternal Aunt 34  . Breast cancer Paternal Aunt        Age 18's  . Diabetes Sister   . Endometrial cancer Sister   . Breast cancer Cousin        Maternal 1st cousins-Age 30's  . Leukemia Paternal Aunt      Prior to Admission medications   Medication Sig Start Date End Date Taking? Authorizing Provider  aspirin 81 MG chewable tablet Chew 1 tablet (81 mg total) 2 (two) times daily by mouth. 06/11/17  Yes Swinteck, Arlys John, MD  Biotin w/ Vitamins C & E (HAIR/SKIN/NAILS PO) Take 1 tablet by mouth daily.   Yes [provider]  cycloSPORINE (RESTASIS) 0.05 % ophthalmic  emulsion Place 1 drop 2 (two) times daily into both eyes.   Yes [provider]  docusate sodium (COLACE) 100 MG capsule Take 1 capsule (100 mg total) 2 (two) times daily by mouth. 06/11/17  Yes Swinteck, Arlys John, MD  HYDROcodone-acetaminophen (NORCO/VICODIN) 5-325 MG tablet Take 1-2 tablets every 6 (six) hours as needed by mouth for moderate pain. 06/11/17  Yes Swinteck, Arlys John, MD  ketotifen (ZADITOR) 0.025 % ophthalmic solution Place 1 drop 4 (four) times daily as needed into both eyes (allergies).    Yes [provider]  LIDOCAINE EX Apply  1 patch topically daily as needed (pain).   Yes [provider]  Multiple Vitamins-Calcium (ONE-A-DAY WOMENS PO) Take 1 tablet by mouth daily.    Yes [provider]  naproxen sodium (ALEVE) 220 MG tablet Take 220 mg by mouth daily as needed (pain).   Yes [provider]  nitroGLYCERIN (NITROSTAT) 0.4 MG SL tablet DISSOLVE 1 TABLET UNDER TONGUE IF NEEDED FOR CHEST PAIN. MAY REPEAT IN 5 MINUTES FOR 3 DOSES. 02/10/16  Yes Rollene RotundaHochrein, James, MD  ondansetron (ZOFRAN) 4 MG tablet Take 1 tablet (4 mg total) every 6 (six) hours as needed by mouth for nausea. 06/11/17  Yes Swinteck, Arlys JohnBrian, MD  senna (SENOKOT) 8.6 MG TABS tablet Take 2 tablets (17.2 mg total) at bedtime by mouth. 06/11/17  Yes Swinteck, Arlys JohnBrian, MD  SYNTHROID 50 MCG tablet TAKE 1 TABLET EACH DAY. 05/28/17  Yes Kriste BasqueKim, [name], DO  triamcinolone cream (KENALOG) 0.1 % Apply 1 application topically 2 (two) times daily. Patient taking differently: Apply 1 application topically daily as needed (rash).  04/10/14  Yes Kriste BasqueKim, [name], DO  rosuvastatin (CRESTOR) 10 MG tablet TAKE (1/2) TABLET DAILY. Patient not taking: Reported on 06/12/2017 03/24/17   Rollene RotundaHochrein, James, MD    Physical Exam: Vitals:   06/12/17 0645 06/12/17 0730 06/12/17 0731 06/12/17 0745  BP: 126/60 (!) 115/54    Pulse: 78 69    Resp: 10 11    Temp:      TempSrc:      SpO2: 97% 96% 97% 96%  Weight:        Height:          Constitutional: NAD, calm, uncomfortable 2/2 ongoing mild posterior neck/right jaw discomfort and postoperative right hip discomfort Eyes: PERRL, lids and conjunctivae normal ENMT: Mucous membranes are moist. Posterior pharynx clear of any exudate or lesions.Normal age-appropriate dentition.  Neck: normal, supple, no masses, no thyromegaly-pain in right neck and jaw all reproducible with palpation over posterior occiput and posterior neck cervical spine level Respiratory: clear to auscultation bilaterally, no wheezing, no crackles. Normal respiratory effort. No accessory muscle use.  No tenderness of anterior chest wall. Cardiovascular: Regular rate and rhythm, no murmurs / rubs / gallops. No extremity edema. 2+ pedal pulses. No carotid bruits.  Abdomen: no tenderness, no masses palpated. No hepatosplenomegaly. Bowel sounds positive.  Musculoskeletal: no clubbing / cyanosis. No joint deformity upper and lower extremities. Good ROM, no contractures. Normal muscle tone.  Skin: no rashes, lesions, ulcers. No induration Neurologic: CN 2-12 grossly intact. Sensation intact, DTR normal. Strength 5/5 x all 4 extremities.  Psychiatric: Normal judgment and insight. Alert and oriented x 3. Normal mood.    Labs on Admission: I have personally reviewed following labs and imaging studies  CBC: Recent Labs  Lab 06/11/17 0500 06/12/17 0428  WBC 9.6 9.2  HGB 11.0* 11.2*  HCT 33.3* 34.5*  MCV 91.2 90.8  PLT 231 200   Basic Metabolic Panel: Recent Labs  Lab 06/11/17 0500 06/12/17 0428  NA 141 136  K 3.9 3.7  CL 110 106  CO2 24 25  GLUCOSE 121* 110*  BUN 13 17  CREATININE 0.63 0.68  CALCIUM 8.0* 8.4*   GFR: Estimated Creatinine Clearance: 48.4 mL/min (by C-G formula based on SCr of 0.68 mg/dL). Liver Function Tests: No results for input(s): AST, ALT, ALKPHOS, BILITOT, PROT, ALBUMIN in the last 168 hours. No results for input(s): LIPASE, AMYLASE in the last 168  hours. No results for input(s): AMMONIA in the last 168  hours. Coagulation Profile: No results for input(s): INR, PROTIME in the last 168 hours. Cardiac Enzymes: Recent Labs  Lab 06/12/17 0428  TROPONINI 0.34*   BNP (last 3 results) No results for input(s): PROBNP in the last 8760 hours. HbA1C: No results for input(s): HGBA1C in the last 72 hours. CBG: No results for input(s): GLUCAP in the last 168 hours. Lipid Profile: No results for input(s): CHOL, HDL, LDLCALC, TRIG, CHOLHDL, LDLDIRECT in the last 72 hours. Thyroid Function Tests: No results for input(s): TSH, T4TOTAL, FREET4, T3FREE, THYROIDAB in the last 72 hours. Anemia Panel: No results for input(s): VITAMINB12, FOLATE, FERRITIN, TIBC, IRON, RETICCTPCT in the last 72 hours. Urine analysis:    Component Value Date/Time   COLORURINE STRAW (A) 02/10/2009 2014   APPEARANCEUR CLEAR 02/10/2009 2014   LABSPEC 1.013 01/24/2013 1150   PHURINE 7.5 01/24/2013 1150   GLUCOSEU NEGATIVE 01/24/2013 1150   HGBUR NEGATIVE 01/24/2013 1150   BILIRUBINUR n 02/19/2014 1112   KETONESUR NEGATIVE 01/24/2013 1150   PROTEINUR trace 02/19/2014 1112   PROTEINUR NEGATIVE 01/24/2013 1150   UROBILINOGEN 0.2 02/19/2014 1112   UROBILINOGEN 0.2 01/24/2013 1150   NITRITE n 02/19/2014 1112   NITRITE NEGATIVE 01/24/2013 1150   LEUKOCYTESUR Trace 02/19/2014 1112   Sepsis Labs: @LABRCNTIP (procalcitonin:4,lacticidven:4) ) Recent Results (from the past 240 hour(s))  Surgical pcr screen     Status: Abnormal   Collection Time: 06/04/17  1:15 PM  Result Value Ref Range Status   MRSA, PCR NEGATIVE NEGATIVE Final   Staphylococcus aureus POSITIVE (A) NEGATIVE Final    Comment: (NOTE) The Xpert SA Assay (FDA approved for NASAL specimens in patients 67 years of age and older), is one component of a comprehensive surveillance program. It is not intended to diagnose infection nor to guide or monitor treatment.      Radiological Exams on Admission: Dg  Chest 2 View  Result Date: 06/12/2017 CLINICAL DATA:  Acute onset of jaw and neck pain. EXAM: CHEST  2 VIEW COMPARISON:  Chest radiograph performed 01/24/2013 FINDINGS: The lungs are hypoexpanded. Mild bibasilar atelectasis is noted. There is no evidence of pleural effusion or pneumothorax. The heart is normal in size; the mediastinal contour is within normal limits. No acute osseous abnormalities are seen. IMPRESSION: Lungs hypoexpanded, with mild bibasilar atelectasis. Electronically Signed   By: Roanna Raider Parks.D.   On: 06/12/2017 05:06   Ct Angio Chest Pe W And/or Wo Contrast  Result Date: 06/12/2017 CLINICAL DATA:  Acute onset RIGHT jaw and neck pain, nausea at 0130 hours. History of hip surgery this than 1 week ago, scoliosis. EXAM: CT ANGIOGRAPHY CHEST WITH CONTRAST TECHNIQUE: Multidetector CT imaging of the chest was performed using the standard protocol during bolus administration of intravenous contrast. Multiplanar CT image reconstructions and MIPs were obtained to evaluate the vascular anatomy. CONTRAST:  ISOVUE-370 IOPAMIDOL (ISOVUE-370) INJECTION 76% COMPARISON:  Chest radiograph June 12, 2017 at 0447 hours FINDINGS: CARDIOVASCULAR: Adequate contrast opacification of the pulmonary artery's. Main pulmonary artery is not enlarged. No pulmonary arterial filling defects to the level of the subsegmental branches. Heart size is mildly enlarged, no right heart strain. No pericardial effusion. Thoracic aorta is normal course and caliber, mild calcific atherosclerosis aortic arch. MEDIASTINUM/NODES: No lymphadenopathy by CT size criteria. LUNGS/PLEURA: Tracheobronchial tree is patent, no pneumothorax. No pleural effusions, focal consolidations, pulmonary nodules or masses. Dependent atelectasis. Bandlike atelectasis RIGHT lower lobe. UPPER ABDOMEN: Nonacute. 11 mm benign LEFT adrenal adenoma (0 Hounsfield units). MUSCULOSKELETAL: Nonacute. RIGHT breast coarse calcification. Upper  thoracic  levoscoliosis. Moderate degenerative change of the thoracic spine. Review of the MIP images confirms the above findings. IMPRESSION: 1. No acute pulmonary embolism. 2. Mild cardiomegaly.  Atelectasis. Aortic Atherosclerosis (ICD10-I70.0). Electronically Signed   By: Awilda Metro Parks.D.   On: 06/12/2017 06:10   Dg Pelvis Portable  Result Date: 06/10/2017 CLINICAL DATA:  Post op right total hip replacement EXAM: PORTABLE PELVIS 1-2 VIEWS COMPARISON:  06/10/2017 FINDINGS: Status post right hip arthroplasty. No evidence for dislocation or interval fracture. Postoperative soft tissue gas identified in the right hip. IMPRESSION: Status post right hip arthroplasty. No evidence for acute abnormality. Electronically Signed   By: Norva Pavlov Parks.D.   On: 06/10/2017 12:42   Dg C-arm 1-60 Min-no Report  Result Date: 06/10/2017 Fluoroscopy was utilized by the requesting physician.  No radiographic interpretation.   Dg Hip Operative Unilat With Pelvis Right  Result Date: 06/10/2017 CLINICAL DATA:  Status post right hip joint prosthesis placement. EXAM: OPERATIVE right HIP (WITH PELVIS IF PERFORMED) 1 VIEWS TECHNIQUE: Fluoroscopic spot image(s) were submitted for interpretation post-operatively. COMPARISON:  Preoperative right hip series of November 11, 2016 FINDINGS: The patient has undergone right hip joint prosthesis placement. Radiographic positioning of the prosthetic components is good. The interface with the native bone appears normal. No acute native bone abnormality is observed. IMPRESSION: No immediate postprocedure complication following right hip joint prosthesis placement. Electronically Signed   By: David  [place] Parks.D.   On: 06/10/2017 12:48    EKG: (Independently reviewed) sinus rhythm with a ventricular rate of 71 bpm, QTC 449 ms, Q waves in the inferior/lateral leads which are unchanged-no acute ischemic changes, early repolarization as well in the inferior lateral leads, normal R wave  rotation  Assessment/Plan Principal Problem:   Unstable angina/known CAD (coronary artery disease) -Presents with several hours duration right jaw/neck pain associated with dizziness, nausea and paleness -current symptoms are not typical of her previous cardiac symptoms in 2012 which consisted of chest pressure -Of note the right jaw pain initially began as occipital pain that is currently reproducible with palpation -Current symptoms seem atypical but given underlying history we will pursue cardiac ischemic evaluation -Cycle troponin; current troponin trend is elevated but flat and likely more reflective of recent surgical procedure ** 2nd troponin has risen to 1.68 so more suspicious that current sx's possibly ischemic in etiology- await cardiology input -Continue IV nitroglycerin with current pain reports at 1/10 -With recent hip surgery cardiology fellow opted not to pursue full dose anticoagulation -IV morphine prn and/or oxygen prn chest pain -Continue preadmission low-dose aspirin -Not on beta-blocker prior to admission and current blood pressure somewhat suboptimal -HEART Score for Major Cardiac Event History: Slightly suspicious (0 points) ECG: Nonspecific repolarisation disturbance/LBTB/PM (1 point) Age: 54 years or older (2 points) Risk factors: At least 3 risk factors or history of atherosclerotic disease (2 points) Troponin: 1 to 3 times normal limit (1 point) Total: 6 points. 13% risk of MACE.  Active Problems:   HLD (hyperlipidemia) -Continue Crestor    S/P hip replacement, right -Patient reports inadequate pain control which has resulted in limited mobility since discharge -PT evaluate to determine if eligible for home health PT -Continue preadmission Vicodin -Add IV morphine for pain control -Consider Lidoderm patch directly to surgical site and/or the ketorolac patch -Ice pack to surgical site -TED hose -Consider utilization of systemic NSAIDs although need to be  short-term given underlying CAD    GERD (gastroesophageal reflux disease) -Not on H2 blocker or PPI  prior to admission    Hypothyroidism, adult -Continue Synthroid -Recent TSH normal      DVT prophylaxis: Lovenox Code Status: Full Family Communication: Daughter Disposition Plan: Home Consults called: Cardiology/[facility]    ELLIS,ALLISON L. ANP-BC [facility] Pager [phone]   If 7PM-7AM, please contact night-coverage [email or web address] Password [email or web address]  06/12/2017, 8:02 AM

## 2017-06-12 NOTE — ED Notes (Signed)
Attempted to call report

## 2017-06-12 NOTE — ED Provider Notes (Signed)
TIME SEEN: 5:11 AM  CHIEF COMPLAINT: Neck pain  HPI: Patient is an 82 year old female with history of previous CAD with a stent to the LAD in 2012, hyperlipidemia, hypothyroidism who presents to the emergency department with EMS after she started having right-sided neck that radiated into her right jaw that started around 1 AM.  She was awake at that time.  She denies any known aggravating or alleviating.  She is unable to describe this pain.  She took 4 aspirin at home and was given fentanyl in route with EMS and pain is improved but has not resolved.  Denies chest pain or chest discomfort.  No shortness of breath.  Did feel nauseated and lightheaded.  No she just had a right total hip arthroplasty by Dr. Veda CanningSwintek [facility] ago [facility].  She states that she did not have similar symptoms when she had to have her LAD stent.  States her last stress test was normal in 2016 in [place].  No history of PE or DVT.  No headache.  No numbness, tingling or focal weakness.  She states she feels weak all over.  ROS: See HPI Constitutional: no fever  Eyes: no drainage  ENT: no runny nose   Cardiovascular:  no chest pain  Resp: no SOB  GI: no vomiting GU: no dysuria Integumentary: no rash  Allergy: no hives  Musculoskeletal: no leg swelling  Neurological: no slurred speech ROS otherwise negative  PAST MEDICAL HISTORY/PAST SURGICAL HISTORY:  Past Medical History:  Diagnosis Date  . Arthritis    DDD, scoliosis, sees Dr. Lovell SheehanJenkins for this, uses norco very rarely for pain  . CAD (coronary artery disease)    LAD stenting of a 90% lesion 2012  . Cystocele   . Elevated cholesterol   . GERD (gastroesophageal reflux disease)    dx in work up 2016 for atypical CP at OSH   pt. denies  . Heart murmur   . Hypothyroidism   . Interstitial cystitis    sees Dr. Annabell HowellsWrenn  . Osteoporosis   . Rectocele   . Scoliosis   . Thyroid disease    Hypothyroid  . Urinary incontinence    USI  . Uterine  prolapse     MEDICATIONS:  Prior to Admission medications   Medication Sig Start Date End Date Taking? Authorizing Provider  aspirin 81 MG chewable tablet Chew 1 tablet (81 mg total) 2 (two) times daily by mouth. 06/11/17   Swinteck, Arlys JohnBrian, MD  aspirin 81 MG tablet Take 81 mg by mouth daily.      [provider]  Biotin w/ Vitamins C & E (HAIR/SKIN/NAILS PO) Take 1 tablet by mouth daily.    [provider]  cycloSPORINE (RESTASIS) 0.05 % ophthalmic emulsion Place 1 drop 2 (two) times daily into both eyes.    [provider]  docusate sodium (COLACE) 100 MG capsule Take 1 capsule (100 mg total) 2 (two) times daily by mouth. 06/11/17   Swinteck, Arlys JohnBrian, MD  HYDROcodone-acetaminophen (NORCO/VICODIN) 5-325 MG tablet Take 1-2 tablets every 6 (six) hours as needed by mouth for moderate pain. 06/11/17   Swinteck, Arlys JohnBrian, MD  ketotifen (ZADITOR) 0.025 % ophthalmic solution Place 1 drop into both eyes 4 (four) times daily.    [provider]  LIDOCAINE EX Apply 1 patch topically daily as needed (pain).    [provider]  Multiple Vitamins-Calcium (ONE-A-DAY WOMENS PO) Take 1 tablet by mouth daily.     [provider]  Multiple Vitamins-Minerals (EYE VITAMINS PO) Take 1 tablet by mouth daily.     [provider]  naproxen sodium (ALEVE) 220 MG tablet Take 220 mg by mouth daily as needed (pain).    [provider]  nitroGLYCERIN (NITROSTAT) 0.4 MG SL tablet DISSOLVE 1 TABLET UNDER TONGUE IF NEEDED FOR CHEST PAIN. MAY REPEAT IN 5 MINUTES FOR 3 DOSES. 02/10/16   Rollene Rotunda, MD  ondansetron (ZOFRAN) 4 MG tablet Take 1 tablet (4 mg total) every 6 (six) hours as needed by mouth for nausea. 06/11/17   Swinteck, Arlys John, MD  rosuvastatin (CRESTOR) 10 MG tablet TAKE (1/2) TABLET DAILY. 03/24/17   Rollene Rotunda, MD  senna (SENOKOT) 8.6 MG TABS tablet Take 2 tablets (17.2 mg total) at bedtime by mouth. 06/11/17   Swinteck, Arlys John, MD  SYNTHROID 50  MCG tablet TAKE 1 TABLET EACH DAY. 05/28/17   Terressa Koyanagi, DO  triamcinolone cream (KENALOG) 0.1 % Apply 1 application topically 2 (two) times daily. Patient taking differently: Apply 1 application topically daily as needed (rash).  04/10/14   Terressa Koyanagi, DO    ALLERGIES:  Allergies  Allergen Reactions  . Acyclovir And Related   . Pravastatin Sodium Other (See Comments)    cystitis  . Zocor [Simvastatin] Other (See Comments)    cystitis    SOCIAL HISTORY:  Social History   Tobacco Use  . Smoking status: Never Smoker  . Smokeless tobacco: Never Used  Substance Use Topics  . Alcohol use: No    Alcohol/week: 0.0 oz    Comment: Rare    FAMILY HISTORY: Family History  Problem Relation Age of Onset  . Hypertension Mother   . Heart disease Mother   . Heart disease Father   . Heart disease Sister   . Colon cancer Paternal Aunt 70  . Breast cancer Paternal Aunt        Age 46's  . Diabetes Sister   . Endometrial cancer Sister   . Breast cancer Cousin        Maternal 1st cousins-Age 70's  . Leukemia Paternal Aunt     EXAM: BP (!) 174/66 (BP Location: Right Arm)   Pulse 73   Temp 98.2 F (36.8 C) (Oral)   Resp 10   Ht 5\' 4"  (1.626 m)   Wt 57.2 kg (126 lb)   SpO2 95%   BMI 21.63 kg/m  CONSTITUTIONAL: Alert and oriented and responds appropriately to questions.  Elderly HEAD: Normocephalic EYES: Conjunctivae clear, pupils appear equal, EOMI ENT: normal nose; moist mucous membranes NECK: Supple, no meningismus, no nuchal rigidity, no LAD  CARD: RRR; S1 and S2 appreciated; no murmurs, no clicks, no rubs, no gallops RESP: Normal chest excursion without splinting or tachypnea; breath sounds clear and equal bilaterally; no wheezes, no rhonchi, no rales, no hypoxia or respiratory distress, speaking full sentences ABD/GI: Normal bowel sounds; non-distended; soft, non-tender, no rebound, no guarding, no peritoneal signs, no hepatosplenomegaly BACK:  The back appears normal  and is non-tender to palpation, there is no CVA tenderness EXT: Incision covered to the right lateral hip with some surrounding warmth but no erythema no drainage or bleeding.  There is small amount of swelling in this area.  Appropriate decreased range of motion in the right hip.  Otherwise normal ROM in all joints; otherwise extremities are non-tender to palpation; no edema; normal capillary refill; no cyanosis, no calf tenderness or swelling, 2+ DP pulses bilaterally    SKIN: Normal color for age and  race; warm; no rash NEURO: Moves all extremities equally PSYCH: The patient's mood and manner are appropriate. Grooming and personal hygiene are appropriate.  MEDICAL DECISION MAKING: Patient here with complaints of right neck pain that radiated into her right jaw that started around 1 AM.  Pain has improved but not resolved.  She did take 4 aspirin herself at home.  Given fentanyl with EMS in route.  We will give her nitroglycerin here.  EKG shows no ischemic abnormality but her I stat troponin is positive.  She is at risk for PE so we will obtain a CT of her chest to rule out that this is the cause of her elevated troponin.  Her chest x-ray is clear.  She will need admission.  ED PROGRESS: 6:14 AM  Repeat labs troponin is positive at 0.34.  CT scan shows no pulmonary embolus.  She has mild cardiomegaly.  No volume overload.  Will discuss with cardiology for admission for NSTEMI.  6:22 AM D/w cardiology fellow Dr. Hildred Alaminevineni.  He will see patient in the emergency department.  Appreciate his help in his prompt response.  He will see patient before deciding if we should start heparin.   6:30 AM  On my reevaluation patient started having right-sided neck pain again.  Pain was completely gone after 3 nitroglycerin tablets.  Will start nitroglycerin drip.  Repeat EKG shows no new changes.   7:20 AM  D/w cardiology fellow who has seen patient in consult.  He agrees with nitroglycerin drip but would like to  hold heparin at this time.  Feel symptoms are very atypical and I agree.  Cardiology will follow along and consult for serial troponins and echocardiogram but would like medicine admission.  Her PCP is Dr. Selena BattenKim with [facility].  7:36 AM Discussed patient's case with hospitalist, Junious SilkAllison Ellis NP.  I have recommended admission and patient (and family if present) agree with this plan. Admitting physician will place admission orders.   I reviewed all nursing notes, vitals, pertinent previous records, EKGs, lab and urine results, imaging (as available).     EKG Interpretation  Date/Time:  Saturday June 12 2017 04:23:40 EST Ventricular Rate:  71 PR Interval:    QRS Duration: 100 QT Interval:  413 QTC Calculation: 449 R Axis:   66 Text Interpretation:  Sinus rhythm No significant change since last tracing Q waves in lateral leads are old Reconfirmed by Ajah Vanhoose, Baxter HireKristen [phone]([id number]) on 06/12/2017 5:30:14 AM        EKG Interpretation  Date/Time:  Saturday June 12 2017 05:21:43 EST Ventricular Rate:  63 PR Interval:    QRS Duration: 100 QT Interval:  441 QTC Calculation: 452 R Axis:   63 Text Interpretation:  Sinus rhythm Q waves in inferior and lateral leads are old compared to previous No significant change since last tracing Confirmed by Delynda Sepulveda, Baxter HireKristen [phone]([id number]) on 06/12/2017 5:33:30 AM        EKG Interpretation  Date/Time:  Saturday June 12 2017 06:26:43 EST Ventricular Rate:  71 PR Interval:    QRS Duration: 97 QT Interval:  430 QTC Calculation: 468 R Axis:   54 Text Interpretation:  Sinus rhythm Inferior infarct, age indeterminate No significant change since last tracing Confirmed by Kirill Chatterjee, Baxter HireKristen [phone]([id number]) on 06/12/2017 6:40:03 AM          Kenan Moodie, Layla MawKristen N, DO 06/12/17 [id number]

## 2017-06-12 NOTE — ED Notes (Signed)
Cards at bedside

## 2017-06-13 ENCOUNTER — Inpatient Hospital Stay (HOSPITAL_COMMUNITY): Payer: Medicare Other

## 2017-06-13 DIAGNOSIS — I340 Nonrheumatic mitral (valve) insufficiency: Secondary | ICD-10-CM

## 2017-06-13 LAB — ECHOCARDIOGRAM COMPLETE
HEIGHTINCHES: 64 in
Weight: 2015.99 oz

## 2017-06-13 MED ORDER — SODIUM CHLORIDE 0.9% FLUSH: 3.0000 mL | INTRAVENOUS | Status: DC | PRN

## 2017-06-13 MED ORDER — SODIUM CHLORIDE 0.9% FLUSH
3.0000 mL | Freq: Two times a day (BID) | INTRAVENOUS | Status: DC
  Administered 2017-06-13: 3 mL via INTRAVENOUS

## 2017-06-13 MED ORDER — CYCLOBENZAPRINE HCL 10 MG PO TABS
5.0000 mg | ORAL_TABLET | Freq: Once | ORAL | Status: AC
  Administered 2017-06-13: 5 mg via ORAL
  Filled 2017-06-13: qty 1

## 2017-06-13 MED ORDER — SODIUM CHLORIDE 0.9 % WEIGHT BASED INFUSION
1.0000 mL/kg/h | INTRAVENOUS | Status: DC
  Administered 2017-06-14: 1 mL/kg/h via INTRAVENOUS

## 2017-06-13 MED ORDER — ASPIRIN 81 MG PO CHEW
81.0000 mg | CHEWABLE_TABLET | ORAL | Status: AC
  Administered 2017-06-14: 81 mg via ORAL
  Filled 2017-06-13: qty 1

## 2017-06-13 MED ORDER — SODIUM CHLORIDE 0.9 % IV SOLN: 250.0000 mL | INTRAVENOUS | Status: DC | PRN

## 2017-06-13 MED ORDER — SODIUM CHLORIDE 0.9 % WEIGHT BASED INFUSION
3.0000 mL/kg/h | INTRAVENOUS | Status: DC
  Administered 2017-06-14: 3 mL/kg/h via INTRAVENOUS

## 2017-06-13 NOTE — H&P (View-Only) (Signed)
Progress Note  Patient Name: Kaitlyn Parks Date of Encounter: 06/13/2017  Primary Cardiologist: Dr. Antoine PocheHochrein  Subjective   Facial pain last night.  Also complaining of hip pain. No pain at current  Inpatient Medications    Scheduled Meds: . aspirin  81 mg Oral BID  . cycloSPORINE  1 drop Both Eyes BID  . docusate sodium  100 mg Oral BID  . enoxaparin (LOVENOX) injection  40 mg Subcutaneous Daily  . levothyroxine  50 mcg Oral QAC breakfast  . methocarbamol  500 mg Oral TID  . metoprolol tartrate  25 mg Oral BID  . rosuvastatin  10 mg Oral q1800  . senna  2 tablet Oral QHS   Continuous Infusions: . nitroGLYCERIN 30 mcg/min (06/13/17 0508)   PRN Meds: acetaminophen, HYDROcodone-acetaminophen, morphine injection, ondansetron (ZOFRAN) IV   Vital Signs    Vitals:   06/13/17 0312 06/13/17 0400 06/13/17 0509 06/13/17 0800  BP: 137/67  132/76 124/65  Pulse: 68 67  76  Resp: 17 16  14   Temp: 98.7 F (37.1 C)   98.4 F (36.9 C)  TempSrc: Oral   Oral  SpO2: 96% 95%  97%  Weight:      Height:        Intake/Output Summary (Last 24 hours) at 06/13/2017 0853 Last data filed at 06/13/2017 0819 Gross per 24 hour  Intake 360.25 ml  Output 675 ml  Net -314.75 ml   Filed Weights   06/12/17 0423 06/12/17 1000  Weight: 126 lb (57.2 kg) 125 lb 16 oz (57.2 kg)    Telemetry    NSR, brief run of SVT - Personally Reviewed  ECG    NA - Personally Reviewed  Physical Exam   GEN: No acute distress.   Neck: No  JVD Cardiac: RRR, NO murmurs, rubs, or gallops.  Respiratory: Clear  to auscultation bilaterally. GI: Soft, nontender, non-distended  MS: No  edema; No deformity.  Right hip bandage clear and dry.  Neuro:  Nonfocal  Psych: Normal affect   Labs    Chemistry Recent Labs  Lab 06/11/17 0500 06/12/17 0428  NA 141 136  K 3.9 3.7  CL 110 106  CO2 24 25  GLUCOSE 121* 110*  BUN 13 17  CREATININE 0.63 0.68  CALCIUM 8.0* 8.4*  GFRNONAA >60 >60  GFRAA >60  >60  ANIONGAP 7 5     Hematology Recent Labs  Lab 06/11/17 0500 06/12/17 0428  WBC 9.6 9.2  RBC 3.65* 3.80*  HGB 11.0* 11.2*  HCT 33.3* 34.5*  MCV 91.2 90.8  MCH 30.1 29.5  MCHC 33.0 32.5  RDW 12.5 12.7  PLT 231 200    Cardiac Enzymes Recent Labs  Lab 06/12/17 0428 06/12/17 0912 06/12/17 1307 06/12/17 1856  TROPONINI 0.34* 1.68* 2.18* 1.76*    Recent Labs  Lab 06/12/17 0440  TROPIPOC 0.42*     BNPNo results for input(s): BNP, PROBNP in the last 168 hours.   DDimer No results for input(s): DDIMER in the last 168 hours.   Radiology    Dg Chest 2 View  Result Date: 06/12/2017 CLINICAL DATA:  Acute onset of jaw and neck pain. EXAM: CHEST  2 VIEW COMPARISON:  Chest radiograph performed 01/24/2013 FINDINGS: The lungs are hypoexpanded. Mild bibasilar atelectasis is noted. There is no evidence of pleural effusion or pneumothorax. The heart is normal in size; the mediastinal contour is within normal limits. No acute osseous abnormalities are seen. IMPRESSION: Lungs hypoexpanded, with mild bibasilar atelectasis.  Electronically Signed   By: Jeffery  Chang M.D.   On: 06/12/2017 05:06   Ct Angio Chest Pe W And/or Wo Contrast  Result Date: 06/12/2017 CLINICAL DATA:  Acute onset RIGHT jaw and neck pain, nausea at 0130 hours. History of hip surgery this than 1 week ago, scoliosis. EXAM: CT ANGIOGRAPHY CHEST WITH CONTRAST TECHNIQUE: Multidetector CT imaging of the chest was performed using the standard protocol during bolus administration of intravenous contrast. Multiplanar CT image reconstructions and MIPs were obtained to evaluate the vascular anatomy. CONTRAST:  100mL ISOVUE-370 IOPAMIDOL (ISOVUE-370) INJECTION 76% COMPARISON:  Chest radiograph June 12, 2017 at 0447 hours FINDINGS: CARDIOVASCULAR: Adequate contrast opacification of the pulmonary artery's. Main pulmonary artery is not enlarged. No pulmonary arterial filling defects to the level of the subsegmental branches.  Heart size is mildly enlarged, no right heart strain. No pericardial effusion. Thoracic aorta is normal course and caliber, mild calcific atherosclerosis aortic arch. MEDIASTINUM/NODES: No lymphadenopathy by CT size criteria. LUNGS/PLEURA: Tracheobronchial tree is patent, no pneumothorax. No pleural effusions, focal consolidations, pulmonary nodules or masses. Dependent atelectasis. Bandlike atelectasis RIGHT lower lobe. UPPER ABDOMEN: Nonacute. 11 mm benign LEFT adrenal adenoma (0 Hounsfield units). MUSCULOSKELETAL: Nonacute. RIGHT breast coarse calcification. Upper thoracic levoscoliosis. Moderate degenerative change of the thoracic spine. Review of the MIP images confirms the above findings. IMPRESSION: 1. No acute pulmonary embolism. 2. Mild cardiomegaly.  Atelectasis. Aortic Atherosclerosis (ICD10-I70.0). Electronically Signed   By: Courtnay  Bloomer M.D.   On: 06/12/2017 06:10    Cardiac Studies   ECHO PENDING.    Patient Profile     82 y.o. female  With elevated troponin post hip surgery.  Had jaw pain.    No acute EKG changes   Assessment & Plan    NSTEMI:  Trop peaked but recurrent pain last night.  Needs cardiac cath.  The patient understands that risks included but are not limited to stroke (1 in 1000), death (1 in 1000), kidney failure [usually temporary] (1 in 500), bleeding (1 in 200), allergic reaction [possibly serious] (1 in 200).  The patient understands and agrees to proceed.   Avoiding systemic heparin with recent surgery.   However, we will talk to surgery to discuss using IV therapy as needed with her procedure and DAPT if she needs intervention.  For now continue DVT Lovenox, ASA, beta blocker.   Echo is being done.  I will repeat an EKG this morning.    Signed, Aybree Lanyon, MD  06/13/2017, 8:53 AM   

## 2017-06-13 NOTE — Progress Notes (Signed)
PROGRESS NOTE    Kaitlyn Parks  UJW:[id number] DOB: 12-21-1934 DOA: 06/12/2017 PCP: Terressa KoyanagiKim, [name], DO   Brief Narrative: 82 y.o. female  With elevated troponin/NSTEMI post hip surgery.  Assessment & Plan:   Principal Problem:   Unstable angina [facility](HCC) - Cardiology managing, unable to place on heparin gtt due to recent operation.  - Pt on nitroglycerin  Active Problems:   CAD (coronary artery disease) - pt on aspirin and crestor. Per cards patient will need heart cath    GERD (gastroesophageal reflux disease) - Stable    HLD (hyperlipidemia) - pt on statin    S/P hip replacement, right   Hypothyroidism, adult - stable on synthroid  DVT prophylaxis: Lovenox Code Status: Full Family Communication: none at bedside Disposition Plan: pending    Consultants:   Cardiology   Procedures: none   Antimicrobials: None   Subjective: Pt has no new complaints  Objective: Vitals:   06/13/17 0400 06/13/17 0509 06/13/17 0800 06/13/17 1100  BP:  132/76 124/65 (!) 110/44  Pulse: 67  76 (!) 39  Resp: 16  14 16   Temp:   98.4 F (36.9 C)   TempSrc:   Oral   SpO2: 95%  97% 98%  Weight:      Height:        Intake/Output Summary (Last 24 hours) at 06/13/2017 1130 Last data filed at 06/13/2017 1007 Gross per 24 hour  Intake 633.65 ml  Output 675 ml  Net -41.35 ml   Filed Weights   06/12/17 0423 06/12/17 1000  Weight: 57.2 kg (126 lb) 57.2 kg (125 lb 16 oz)    Examination:  General exam: Appears calm and comfortable, in nad. Respiratory system: Clear to auscultation. Respiratory effort normal. Cardiovascular system: S1 & S2 heard, RRR. No JVD, murmurs, rubs Gastrointestinal system: Abdomen is nondistended, soft and nontender. No organomegaly or masses felt. Normal bowel sounds heard. Central nervous system: Alert and oriented. No focal neurological deficits. Extremities: warm + pulses Skin: No rashes, lesions or ulcers, on limited exam. Psychiatry: Judgement  and insight appear normal. Mood & affect appropriate.     Data Reviewed: I have personally reviewed following labs and imaging studies  CBC: Recent Labs  Lab 06/11/17 0500 06/12/17 0428  WBC 9.6 9.2  HGB 11.0* 11.2*  HCT 33.3* 34.5*  MCV 91.2 90.8  PLT 231 200   Basic Metabolic Panel: Recent Labs  Lab 06/11/17 0500 06/12/17 0428  NA 141 136  K 3.9 3.7  CL 110 106  CO2 24 25  GLUCOSE 121* 110*  BUN 13 17  CREATININE 0.63 0.68  CALCIUM 8.0* 8.4*   GFR: Estimated Creatinine Clearance: 48.4 mL/min (by C-G formula based on SCr of 0.68 mg/dL). Liver Function Tests: No results for input(s): AST, ALT, ALKPHOS, BILITOT, PROT, ALBUMIN in the last 168 hours. No results for input(s): LIPASE, AMYLASE in the last 168 hours. No results for input(s): AMMONIA in the last 168 hours. Coagulation Profile: No results for input(s): INR, PROTIME in the last 168 hours. Cardiac Enzymes: Recent Labs  Lab 06/12/17 0428 06/12/17 0912 06/12/17 1307 06/12/17 1856  TROPONINI 0.34* 1.68* 2.18* 1.76*   BNP (last 3 results) No results for input(s): PROBNP in the last 8760 hours. HbA1C: No results for input(s): HGBA1C in the last 72 hours. CBG: No results for input(s): GLUCAP in the last 168 hours. Lipid Profile: No results for input(s): CHOL, HDL, LDLCALC, TRIG, CHOLHDL, LDLDIRECT in the last 72 hours. Thyroid Function Tests: No results  for input(s): TSH, T4TOTAL, FREET4, T3FREE, THYROIDAB in the last 72 hours. Anemia Panel: No results for input(s): VITAMINB12, FOLATE, FERRITIN, TIBC, IRON, RETICCTPCT in the last 72 hours. Sepsis Labs: No results for input(s): PROCALCITON, LATICACIDVEN in the last 168 hours.  Recent Results (from the past 240 hour(s))  Surgical pcr screen     Status: Abnormal   Collection Time: 06/04/17  1:15 PM  Result Value Ref Range Status   MRSA, PCR NEGATIVE NEGATIVE Final   Staphylococcus aureus POSITIVE (A) NEGATIVE Final    Comment: (NOTE) The Xpert SA  Assay (FDA approved for NASAL specimens in patients 80 years of age and older), is one component of a comprehensive surveillance program. It is not intended to diagnose infection nor to guide or monitor treatment.      Radiology Studies: Dg Chest 2 View  Result Date: 06/12/2017 CLINICAL DATA:  Acute onset of jaw and neck pain. EXAM: CHEST  2 VIEW COMPARISON:  Chest radiograph performed 01/24/2013 FINDINGS: The lungs are hypoexpanded. Mild bibasilar atelectasis is noted. There is no evidence of pleural effusion or pneumothorax. The heart is normal in size; the mediastinal contour is within normal limits. No acute osseous abnormalities are seen. IMPRESSION: Lungs hypoexpanded, with mild bibasilar atelectasis. Electronically Signed   By: Roanna RaiderJeffery  Chang M.D.   On: 06/12/2017 05:06   Ct Angio Chest Pe W And/or Wo Contrast  Result Date: 06/12/2017 CLINICAL DATA:  Acute onset RIGHT jaw and neck pain, nausea at 0130 hours. History of hip surgery this than 1 week ago, scoliosis. EXAM: CT ANGIOGRAPHY CHEST WITH CONTRAST TECHNIQUE: Multidetector CT imaging of the chest was performed using the standard protocol during bolus administration of intravenous contrast. Multiplanar CT image reconstructions and MIPs were obtained to evaluate the vascular anatomy. CONTRAST:  100mL ISOVUE-370 IOPAMIDOL (ISOVUE-370) INJECTION 76% COMPARISON:  Chest radiograph June 12, 2017 at 0447 hours FINDINGS: CARDIOVASCULAR: Adequate contrast opacification of the pulmonary artery's. Main pulmonary artery is not enlarged. No pulmonary arterial filling defects to the level of the subsegmental branches. Heart size is mildly enlarged, no right heart strain. No pericardial effusion. Thoracic aorta is normal course and caliber, mild calcific atherosclerosis aortic arch. MEDIASTINUM/NODES: No lymphadenopathy by CT size criteria. LUNGS/PLEURA: Tracheobronchial tree is patent, no pneumothorax. No pleural effusions, focal consolidations,  pulmonary nodules or masses. Dependent atelectasis. Bandlike atelectasis RIGHT lower lobe. UPPER ABDOMEN: Nonacute. 11 mm benign LEFT adrenal adenoma (0 Hounsfield units). MUSCULOSKELETAL: Nonacute. RIGHT breast coarse calcification. Upper thoracic levoscoliosis. Moderate degenerative change of the thoracic spine. Review of the MIP images confirms the above findings. IMPRESSION: 1. No acute pulmonary embolism. 2. Mild cardiomegaly.  Atelectasis. Aortic Atherosclerosis (ICD10-I70.0). Electronically Signed   By: Awilda Metroourtnay  Bloomer M.D.   On: 06/12/2017 06:10   Scheduled Meds: . aspirin  81 mg Oral BID  . cycloSPORINE  1 drop Both Eyes BID  . docusate sodium  100 mg Oral BID  . enoxaparin (LOVENOX) injection  40 mg Subcutaneous Daily  . levothyroxine  50 mcg Oral QAC breakfast  . methocarbamol  500 mg Oral TID  . metoprolol tartrate  25 mg Oral BID  . rosuvastatin  10 mg Oral q1800  . senna  2 tablet Oral QHS   Continuous Infusions: . nitroGLYCERIN 30 mcg/min (06/13/17 0508)     LOS: 1 day    Time spent: > 35 minutes  Penny PiaVEGA, Michelle Wnek, MD [facility] Pager [phone]  If 7PM-7AM, please contact night-coverage [email or web address] Password [email or web address] 06/13/2017, 11:30 AM

## 2017-06-13 NOTE — Progress Notes (Signed)
Patient had 2 episodes of bradycardia with HR in 30s, irregular, returned to 50s . Patient states she feels "slightly dizzy", BP stable at 125/60. Cardiology notified, RN advised to monitor closely.  Patient's HR now in 70s, BP 129/64. Will continue to monitor.

## 2017-06-13 NOTE — Progress Notes (Signed)
  Echocardiogram 2D Echocardiogram has been performed.  Celene SkeenVijay  Navia Lindahl 06/13/2017, 9:03 AM

## 2017-06-13 NOTE — Evaluation (Signed)
Physical Therapy Evaluation Patient Details Name: Kaitlyn Parks MRN: [id number] DOB: 06/18/1935 Today's Date: 06/13/2017   History of Present Illness  Patient is an 82 yo female admitted 06/12/17 with Rt jaw pain and nausea.  Patient with elevated troponin, and NSTEMI.  Troponin on downward trend.  Patient for cardiac cath 06/14/17.     PMH:  [facility], s/p Rt direct anterior THA on 06/10/17, and d/c from hospital 06/11/17.   CAD, HLD, arthritis, scoliosis  Clinical Impression  Patient presents with problems listed below.  Will benefit from acute PT to maximize functional independence prior to discharge.  PT session limited today due to patient's pain.  Recommend HHPT at discharge for continued therapy.  Patient for cardiac cath tomorrow (06/14/17).  Will return after cath and continue PT as appropriate for patient.    Follow Up Recommendations Home health PT;Supervision for mobility/OOB    Equipment Recommendations  3in1 (PT)    Recommendations for Other Services       Precautions / Restrictions Precautions Precautions: Fall Restrictions Weight Bearing Restrictions: Yes RLE Weight Bearing: Weight bearing as tolerated      Mobility  Bed Mobility               General bed mobility comments: Patient declined any mobility due to significant pain.  Transfers                    Ambulation/Gait                Stairs            Wheelchair Mobility    Modified Rankin (Stroke Patients Only)       Balance                                             Pertinent Vitals/Pain Pain Assessment: 0-10 Pain Score: 8  Pain Location: Rt hip and thigh Pain Descriptors / Indicators: Aching;Sore;Spasm;Tender Pain Intervention(s): Limited activity within patient's tolerance;Monitored during session;Premedicated before session;Repositioned;Ice applied    Home Living Family/patient expects to be discharged to:: Private  residence Living Arrangements: Alone Available Help at Discharge: Family Type of Home: House Home Access: Stairs to enter Entrance Stairs-Rails: None Entrance Stairs-Number of Steps: 2 Home Layout: Multi-level;Able to live on main level with bedroom/bathroom Home Equipment: Dan HumphreysWalker - 2 wheels;Cane - single point Additional Comments: has moved bed to first floor    Prior Function Level of Independence: Independent               Hand Dominance        Extremity/Trunk Assessment   Upper Extremity Assessment Upper Extremity Assessment: Overall WFL for tasks assessed    Lower Extremity Assessment Lower Extremity Assessment: RLE deficits/detail RLE Deficits / Details: Decreased hip ROM and strength post-op with increased pain.    Cervical / Trunk Assessment Cervical / Trunk Assessment: Other exceptions Cervical / Trunk Exceptions: scoliotic  Communication   Communication: No difficulties  Cognition Arousal/Alertness: Awake/alert Behavior During Therapy: WFL for tasks assessed/performed Overall Cognitive Status: Within Functional Limits for tasks assessed                                        General Comments      Exercises Total Joint  Exercises Ankle Circles/Pumps: AROM;Both;10 reps;Supine Quad Sets: AROM;Both;10 reps;Supine Gluteal Sets: AROM;Left;10 reps;Supine(1/2 bridges) Short Arc Quad: AROM;Both;10 reps;Supine Heel Slides: AROM;Both;10 reps;Supine(AAROM on RLE) Hip ABduction/ADduction: AROM;Both;10 reps;Supine Bridges: AROM;Left;10 reps;Supine   Assessment/Plan    PT Assessment Patient needs continued PT services  PT Problem List Decreased strength;Decreased range of motion;Decreased knowledge of use of DME;Decreased activity tolerance;Decreased safety awareness;Decreased knowledge of precautions;Decreased mobility;Pain       PT Treatment Interventions DME instruction;Gait training;Functional mobility training;Patient/family  education;Therapeutic activities;Therapeutic exercise    PT Goals (Current goals can be found in the Care Plan section)  Acute Rehab PT Goals Patient Stated Goal: decrease pain PT Goal Formulation: With patient/family Time For Goal Achievement: 06/20/17 Potential to Achieve Goals: Good    Frequency Min 5X/week   Barriers to discharge        Co-evaluation               AM-PAC PT "6 Clicks" Daily Activity  Outcome Measure Difficulty turning over in bed (including adjusting bedclothes, sheets and blankets)?: Unable Difficulty moving from lying on back to sitting on the side of the bed? : Unable Difficulty sitting down on and standing up from a chair with arms (e.g., wheelchair, bedside commode, etc,.)?: Unable Help needed moving to and from a bed to chair (including a wheelchair)?: A Lot Help needed walking in hospital room?: A Lot Help needed climbing 3-5 steps with a railing? : A Lot 6 Click Score: 9    End of Session   Activity Tolerance: Patient tolerated treatment well;Patient limited by pain Patient left: in bed;with call bell/phone within reach;with family/visitor present   PT Visit Diagnosis: Difficulty in walking, not elsewhere classified (R26.2);Pain Pain - Right/Left: Right Pain - part of body: Hip;Leg    Time: 1540-1556 PT Time Calculation (min) (ACUTE ONLY): 16 min   Charges:   PT Evaluation $PT Eval Moderate Complexity: 1 Mod     PT G Codes:        Durenda HurtSusan H. Renaldo Fiddleravis, PT, [facility] Acute Rehab Services Pager [phone]   Vena AustriaSusan H Jesiah Yerby 06/13/2017, 10:06 PM

## 2017-06-13 NOTE — Progress Notes (Signed)
Progress Note  Patient Name: Patsy BaltimoreBetty M Kumagai Date of Encounter: 06/13/2017  Primary Cardiologist: Dr. Antoine PocheHochrein  Subjective   Facial pain last night.  Also complaining of hip pain. No pain at current  Inpatient Medications    Scheduled Meds: . aspirin  81 mg Oral BID  . cycloSPORINE  1 drop Both Eyes BID  . docusate sodium  100 mg Oral BID  . enoxaparin (LOVENOX) injection  40 mg Subcutaneous Daily  . levothyroxine  50 mcg Oral QAC breakfast  . methocarbamol  500 mg Oral TID  . metoprolol tartrate  25 mg Oral BID  . rosuvastatin  10 mg Oral q1800  . senna  2 tablet Oral QHS   Continuous Infusions: . nitroGLYCERIN 30 mcg/min (06/13/17 0508)   PRN Meds: acetaminophen, HYDROcodone-acetaminophen, morphine injection, ondansetron (ZOFRAN) IV   Vital Signs    Vitals:   06/13/17 0312 06/13/17 0400 06/13/17 0509 06/13/17 0800  BP: 137/67  132/76 124/65  Pulse: 68 67  76  Resp: 17 16  14   Temp: 98.7 F (37.1 C)   98.4 F (36.9 C)  TempSrc: Oral   Oral  SpO2: 96% 95%  97%  Weight:      Height:        Intake/Output Summary (Last 24 hours) at 06/13/2017 0853 Last data filed at 06/13/2017 0819 Gross per 24 hour  Intake 360.25 ml  Output 675 ml  Net -314.75 ml   Filed Weights   06/12/17 0423 06/12/17 1000  Weight: 126 lb (57.2 kg) 125 lb 16 oz (57.2 kg)    Telemetry    NSR, brief run of SVT - Personally Reviewed  ECG    NA - Personally Reviewed  Physical Exam   GEN: No acute distress.   Neck: No  JVD Cardiac: RRR, NO murmurs, rubs, or gallops.  Respiratory: Clear  to auscultation bilaterally. GI: Soft, nontender, non-distended  MS: No  edema; No deformity.  Right hip bandage clear and dry.  Neuro:  Nonfocal  Psych: Normal affect   Labs    Chemistry Recent Labs  Lab 06/11/17 0500 06/12/17 0428  NA 141 136  K 3.9 3.7  CL 110 106  CO2 24 25  GLUCOSE 121* 110*  BUN 13 17  CREATININE 0.63 0.68  CALCIUM 8.0* 8.4*  GFRNONAA >60 >60  GFRAA >60  >60  ANIONGAP 7 5     Hematology Recent Labs  Lab 06/11/17 0500 06/12/17 0428  WBC 9.6 9.2  RBC 3.65* 3.80*  HGB 11.0* 11.2*  HCT 33.3* 34.5*  MCV 91.2 90.8  MCH 30.1 29.5  MCHC 33.0 32.5  RDW 12.5 12.7  PLT 231 200    Cardiac Enzymes Recent Labs  Lab 06/12/17 0428 06/12/17 0912 06/12/17 1307 06/12/17 1856  TROPONINI 0.34* 1.68* 2.18* 1.76*    Recent Labs  Lab 06/12/17 0440  TROPIPOC 0.42*     BNPNo results for input(s): BNP, PROBNP in the last 168 hours.   DDimer No results for input(s): DDIMER in the last 168 hours.   Radiology    Dg Chest 2 View  Result Date: 06/12/2017 CLINICAL DATA:  Acute onset of jaw and neck pain. EXAM: CHEST  2 VIEW COMPARISON:  Chest radiograph performed 01/24/2013 FINDINGS: The lungs are hypoexpanded. Mild bibasilar atelectasis is noted. There is no evidence of pleural effusion or pneumothorax. The heart is normal in size; the mediastinal contour is within normal limits. No acute osseous abnormalities are seen. IMPRESSION: Lungs hypoexpanded, with mild bibasilar atelectasis.  Electronically Signed   By: Roanna RaiderJeffery  Chang M.D.   On: 06/12/2017 05:06   Ct Angio Chest Pe W And/or Wo Contrast  Result Date: 06/12/2017 CLINICAL DATA:  Acute onset RIGHT jaw and neck pain, nausea at 0130 hours. History of hip surgery this than 1 week ago, scoliosis. EXAM: CT ANGIOGRAPHY CHEST WITH CONTRAST TECHNIQUE: Multidetector CT imaging of the chest was performed using the standard protocol during bolus administration of intravenous contrast. Multiplanar CT image reconstructions and MIPs were obtained to evaluate the vascular anatomy. CONTRAST:  100mL ISOVUE-370 IOPAMIDOL (ISOVUE-370) INJECTION 76% COMPARISON:  Chest radiograph June 12, 2017 at 0447 hours FINDINGS: CARDIOVASCULAR: Adequate contrast opacification of the pulmonary artery's. Main pulmonary artery is not enlarged. No pulmonary arterial filling defects to the level of the subsegmental branches.  Heart size is mildly enlarged, no right heart strain. No pericardial effusion. Thoracic aorta is normal course and caliber, mild calcific atherosclerosis aortic arch. MEDIASTINUM/NODES: No lymphadenopathy by CT size criteria. LUNGS/PLEURA: Tracheobronchial tree is patent, no pneumothorax. No pleural effusions, focal consolidations, pulmonary nodules or masses. Dependent atelectasis. Bandlike atelectasis RIGHT lower lobe. UPPER ABDOMEN: Nonacute. 11 mm benign LEFT adrenal adenoma (0 Hounsfield units). MUSCULOSKELETAL: Nonacute. RIGHT breast coarse calcification. Upper thoracic levoscoliosis. Moderate degenerative change of the thoracic spine. Review of the MIP images confirms the above findings. IMPRESSION: 1. No acute pulmonary embolism. 2. Mild cardiomegaly.  Atelectasis. Aortic Atherosclerosis (ICD10-I70.0). Electronically Signed   By: Awilda Metroourtnay  Bloomer M.D.   On: 06/12/2017 06:10    Cardiac Studies   ECHO PENDING.    Patient Profile     82 y.o. female  With elevated troponin post hip surgery.  Had jaw pain.    No acute EKG changes   Assessment & Plan    NSTEMI:  Trop peaked but recurrent pain last night.  Needs cardiac cath.  The patient understands that risks included but are not limited to stroke (1 in 1000), death (1 in 1000), kidney failure [usually temporary] (1 in 500), bleeding (1 in 200), allergic reaction [possibly serious] (1 in 200).  The patient understands and agrees to proceed.   Avoiding systemic heparin with recent surgery.   However, we will talk to surgery to discuss using IV therapy as needed with her procedure and DAPT if she needs intervention.  For now continue DVT Lovenox, ASA, beta blocker.   Echo is being done.  I will repeat an EKG this morning.    Signed, Rollene RotundaJames Christianne Zacher, MD  06/13/2017, 8:53 AM

## 2017-06-13 NOTE — Progress Notes (Addendum)
Patient called RN to C/O right facial pain "This pain is exactly what brought me to the hospital." Also, my hip is killing me."  Increased Nito gtt from 20 mcg to 30 mcg via pump and added NS at 125 to the line. Patient stated her pain was a 9 on the right side of her face. Patient was given Morphine 1mg  and Vicodin 1 tab for pain. Patient then decided she wanted to try to get out of bed to chair. Patient screamed she was not able to get up her pain was so bad. Patient laid back in bed and she relaxed. Nitro still at 30mg  and she stated her facial pain has eased off. O2 applied [place] at 3L VS remain stable and patient resting quietly in bed. Will continue to monitor closely.

## 2017-06-14 ENCOUNTER — Encounter (HOSPITAL_COMMUNITY)
Admission: EM | Disposition: A | Discharge status: Home / Self Care | Payer: Self-pay | Source: Home / Self Care | Attending: Family Medicine

## 2017-06-14 ENCOUNTER — Encounter (HOSPITAL_COMMUNITY): Payer: Self-pay | Admitting: Physician Assistant

## 2017-06-14 DIAGNOSIS — I214 Non-ST elevation (NSTEMI) myocardial infarction: Principal | ICD-10-CM

## 2017-06-14 DIAGNOSIS — I25110 Atherosclerotic heart disease of native coronary artery with unstable angina pectoris: Secondary | ICD-10-CM

## 2017-06-14 DIAGNOSIS — R001 Bradycardia, unspecified: Secondary | ICD-10-CM

## 2017-06-14 HISTORY — PX: LEFT HEART CATH AND CORONARY ANGIOGRAPHY: CATH118249

## 2017-06-14 LAB — PROTIME-INR
INR: 0.95
Prothrombin Time: 12.5 seconds (ref 11.4–15.2)

## 2017-06-14 SURGERY — LEFT HEART CATH AND CORONARY ANGIOGRAPHY
Anesthesia: LOCAL

## 2017-06-14 MED ORDER — HEPARIN SODIUM (PORCINE) 1000 UNIT/ML IJ SOLN
INTRAMUSCULAR | Status: DC | PRN
  Administered 2017-06-14: 3000 [IU] via INTRAVENOUS

## 2017-06-14 MED ORDER — HEPARIN (PORCINE) IN NACL 2-0.9 UNIT/ML-% IJ SOLN
INTRAMUSCULAR | Status: DC | PRN
Start: 2017-06-14 — End: 2017-06-14
  Administered 2017-06-14: 1000 mL

## 2017-06-14 MED ORDER — VERAPAMIL HCL 2.5 MG/ML IV SOLN
INTRA_ARTERIAL | Status: DC | PRN
  Administered 2017-06-14: 15 mL via INTRA_ARTERIAL

## 2017-06-14 MED ORDER — FENTANYL CITRATE (PF) 100 MCG/2ML IJ SOLN
INTRAMUSCULAR | Status: AC
  Filled 2017-06-14: qty 2

## 2017-06-14 MED ORDER — MORPHINE SULFATE (PF) 2 MG/ML IV SOLN: 2.0000 mg | INTRAVENOUS | Status: DC | PRN

## 2017-06-14 MED ORDER — FENTANYL CITRATE (PF) 100 MCG/2ML IJ SOLN
INTRAMUSCULAR | Status: DC | PRN
  Administered 2017-06-14: 25 ug via INTRAVENOUS

## 2017-06-14 MED ORDER — HEPARIN (PORCINE) IN NACL 2-0.9 UNIT/ML-% IJ SOLN
INTRAMUSCULAR | Status: AC
  Filled 2017-06-14: qty 1000

## 2017-06-14 MED ORDER — HEPARIN SODIUM (PORCINE) 1000 UNIT/ML IJ SOLN
INTRAMUSCULAR | Status: AC
  Filled 2017-06-14: qty 1

## 2017-06-14 MED ORDER — MIDAZOLAM HCL 2 MG/2ML IJ SOLN
INTRAMUSCULAR | Status: AC
  Filled 2017-06-14: qty 2

## 2017-06-14 MED ORDER — ACETAMINOPHEN 325 MG PO TABS: 650.0000 mg | ORAL_TABLET | ORAL | Status: DC | PRN

## 2017-06-14 MED ORDER — IOPAMIDOL (ISOVUE-370) INJECTION 76%
INTRAVENOUS | Status: DC | PRN
  Administered 2017-06-14: 45 mL via INTRA_ARTERIAL

## 2017-06-14 MED ORDER — SODIUM CHLORIDE 0.9 % IV SOLN: 250.0000 mL | INTRAVENOUS | Status: DC | PRN

## 2017-06-14 MED ORDER — SODIUM CHLORIDE 0.9% FLUSH: 3.0000 mL | INTRAVENOUS | Status: DC | PRN

## 2017-06-14 MED ORDER — ASPIRIN 81 MG PO CHEW: 81.0000 mg | CHEWABLE_TABLET | Freq: Every day | ORAL | Status: DC

## 2017-06-14 MED ORDER — METOPROLOL TARTRATE 12.5 MG HALF TABLET: 12.5000 mg | ORAL_TABLET | Freq: Two times a day (BID) | ORAL | Status: DC

## 2017-06-14 MED ORDER — MORPHINE SULFATE (PF) 10 MG/ML IV SOLN: 2.0000 mg | INTRAVENOUS | Status: DC | PRN

## 2017-06-14 MED ORDER — SODIUM CHLORIDE 0.9 % IV SOLN
INTRAVENOUS | Status: AC
  Administered 2017-06-14: 09:00:00 via INTRAVENOUS

## 2017-06-14 MED ORDER — VERAPAMIL HCL 2.5 MG/ML IV SOLN
INTRAVENOUS | Status: AC
  Filled 2017-06-14: qty 2

## 2017-06-14 MED ORDER — ISOSORBIDE MONONITRATE ER 60 MG PO TB24
60.0000 mg | ORAL_TABLET | Freq: Every day | ORAL | Status: DC
  Administered 2017-06-14: 60 mg via ORAL
  Filled 2017-06-14: qty 1

## 2017-06-14 MED ORDER — LIDOCAINE HCL (PF) 1 % IJ SOLN
INTRAMUSCULAR | Status: AC
  Filled 2017-06-14: qty 30

## 2017-06-14 MED ORDER — ONDANSETRON HCL 4 MG/2ML IJ SOLN: 4.0000 mg | Freq: Four times a day (QID) | INTRAMUSCULAR | Status: DC | PRN

## 2017-06-14 MED ORDER — SODIUM CHLORIDE 0.9% FLUSH: 3.0000 mL | Freq: Two times a day (BID) | INTRAVENOUS | Status: DC

## 2017-06-14 MED ORDER — LIDOCAINE HCL (PF) 1 % IJ SOLN
INTRAMUSCULAR | Status: DC | PRN
  Administered 2017-06-14: 2 mL via SUBCUTANEOUS

## 2017-06-14 MED ORDER — MIDAZOLAM HCL 2 MG/2ML IJ SOLN
INTRAMUSCULAR | Status: DC | PRN
  Administered 2017-06-14: 1 mg via INTRAVENOUS

## 2017-06-14 MED ORDER — ISOSORBIDE MONONITRATE ER 60 MG PO TB24: 60.0000 mg | ORAL_TABLET | Freq: Every day | ORAL | 11 refills | Status: DC

## 2017-06-14 MED ORDER — NITROGLYCERIN 1 MG/10 ML FOR IR/CATH LAB
INTRA_ARTERIAL | Status: AC
  Filled 2017-06-14: qty 10

## 2017-06-14 MED ORDER — METOPROLOL TARTRATE 25 MG PO TABS: 12.5000 mg | ORAL_TABLET | Freq: Two times a day (BID) | ORAL | 11 refills | Status: DC

## 2017-06-14 SURGICAL SUPPLY — 13 items
CATH INFINITI 5FR ANG PIGTAIL (CATHETERS) ×2 IMPLANT
CATH INFINITI JR4 5F (CATHETERS) IMPLANT
CATH OPTITORQUE TIG 4.0 5F (CATHETERS) ×2 IMPLANT
DEVICE RAD COMP TR BAND LRG (VASCULAR PRODUCTS) IMPLANT
DEVICE RAD TR BAND REGULAR (VASCULAR PRODUCTS) ×2 IMPLANT
GLIDESHEATH SLEND A-KIT 6F 22G (SHEATH) ×2 IMPLANT
GUIDEWIRE INQWIRE 1.5J.035X260 (WIRE) ×1 IMPLANT
INQWIRE 1.5J .035X260CM (WIRE) ×2
KIT HEART LEFT (KITS) ×2 IMPLANT
PACK CARDIAC CATHETERIZATION (CUSTOM PROCEDURE TRAY) ×2 IMPLANT
TRANSDUCER W/STOPCOCK (MISCELLANEOUS) ×2 IMPLANT
TUBING CIL FLEX 10 FLL-RA (TUBING) ×2 IMPLANT
WIRE HI TORQ VERSACORE-J 145CM (WIRE) ×2 IMPLANT

## 2017-06-14 NOTE — Care Management Note (Addendum)
Case Management Note  Patient Details  Name: Kaitlyn Parks MRN: [id number] Date of Birth: March 28, 1935  Subjective/Objective:   Pt admitted with NSTEMI          Action/Plan:   PTA independent from home - daughter will stay with pt at discharge.  [facility] and [facility].  per daughter - Pt to transport home via private vehicle driven by daughter.  CM requested orders   Expected Discharge Date:  06/15/17               Expected Discharge Plan:  Home w Home Health Services  In-House Referral:     Discharge planning Services  CM Consult  Post Acute Care Choice:    Choice offered to:     DME Arranged:  3-N-1 DME Agency:  [facility]  [facility]  [facility]  Status of Service:  Completed, signed off  If discussed at Long Length of Stay Meetings, dates discussed:    Additional Comments:  [name], [name], RN 06/14/2017, 10:28 AM

## 2017-06-14 NOTE — Progress Notes (Signed)
PROGRESS NOTE    Kaitlyn Parks  ZOX:[id number] DOB: 1935-05-10 DOA: 06/12/2017 PCP: Terressa KoyanagiKim, [name], DO   Brief Narrative: 82 y.o. female  With elevated troponin/NSTEMI post hip surgery.  Assessment & Plan:   Principal Problem:   Unstable angina [facility](HCC) - Cardiology managing, pt is s/p cardiac cath.  Active Problems:   CAD (coronary artery disease) - pt on aspirin and crestor. Per cards patient will need heart cath    GERD (gastroesophageal reflux disease) - Stable    HLD (hyperlipidemia) - pt on statin    S/P hip replacement, right   Hypothyroidism, adult - stable on synthroid  DVT prophylaxis: Lovenox Code Status: Full Family Communication: none at bedside Disposition Plan: pending    Consultants:   Cardiology   Procedures: none   Antimicrobials: None   Subjective: Pt has no chest pain  Objective: Vitals:   06/14/17 0812 06/14/17 0817 06/14/17 0840 06/14/17 1110  BP: (!) 176/92 (!) 174/87 (!) 174/69 (!) 141/60  Pulse: 86 84 79   Resp: 11 10 12    Temp:   98.8 F (37.1 C)   TempSrc:   Oral   SpO2: 94% 95% 96%   Weight:      Height:        Intake/Output Summary (Last 24 hours) at 06/14/2017 1114 Last data filed at 06/14/2017 0653 Gross per 24 hour  Intake 786.9 ml  Output 2250 ml  Net -1463.1 ml   Filed Weights   06/12/17 0423 06/12/17 1000 06/13/17 1806  Weight: 57.2 kg (126 lb) 57.2 kg (125 lb 16 oz) 62 kg (136 lb 11 oz)    Examination:  General exam: Pt in nad, Appears calm and comfortable Respiratory system: Clear to auscultation. Respiratory effort normal. Cardiovascular system: S1 & S2 heard, RRR. No JVD, murmurs, rubs Gastrointestinal system: Abdomen is nondistended, soft and nontender. No organomegaly or masses felt. Normal bowel sounds heard. Central nervous system: Alert and oriented. No focal neurological deficits. Extremities: warm + pulses Skin: No rashes, lesions or ulcers, on limited exam. Bandage over left  wrist Psychiatry: Judgement and insight appear normal. Mood & affect appropriate.     Data Reviewed: I have personally reviewed following labs and imaging studies  CBC: Recent Labs  Lab 06/11/17 0500 06/12/17 0428  WBC 9.6 9.2  HGB 11.0* 11.2*  HCT 33.3* 34.5*  MCV 91.2 90.8  PLT 231 200   Basic Metabolic Panel: Recent Labs  Lab 06/11/17 0500 06/12/17 0428  NA 141 136  K 3.9 3.7  CL 110 106  CO2 24 25  GLUCOSE 121* 110*  BUN 13 17  CREATININE 0.63 0.68  CALCIUM 8.0* 8.4*   GFR: Estimated Creatinine Clearance: 48.4 mL/min (by C-G formula based on SCr of 0.68 mg/dL). Liver Function Tests: No results for input(s): AST, ALT, ALKPHOS, BILITOT, PROT, ALBUMIN in the last 168 hours. No results for input(s): LIPASE, AMYLASE in the last 168 hours. No results for input(s): AMMONIA in the last 168 hours. Coagulation Profile: Recent Labs  Lab 06/14/17 0530  INR 0.95   Cardiac Enzymes: Recent Labs  Lab 06/12/17 0428 06/12/17 0912 06/12/17 1307 06/12/17 1856  TROPONINI 0.34* 1.68* 2.18* 1.76*   BNP (last 3 results) No results for input(s): PROBNP in the last 8760 hours. HbA1C: No results for input(s): HGBA1C in the last 72 hours. CBG: No results for input(s): GLUCAP in the last 168 hours. Lipid Profile: No results for input(s): CHOL, HDL, LDLCALC, TRIG, CHOLHDL, LDLDIRECT in the last 72 hours.  Thyroid Function Tests: No results for input(s): TSH, T4TOTAL, FREET4, T3FREE, THYROIDAB in the last 72 hours. Anemia Panel: No results for input(s): VITAMINB12, FOLATE, FERRITIN, TIBC, IRON, RETICCTPCT in the last 72 hours. Sepsis Labs: No results for input(s): PROCALCITON, LATICACIDVEN in the last 168 hours.  Recent Results (from the past 240 hour(s))  Surgical pcr screen     Status: Abnormal   Collection Time: 06/04/17  1:15 PM  Result Value Ref Range Status   MRSA, PCR NEGATIVE NEGATIVE Final   Staphylococcus aureus POSITIVE (A) NEGATIVE Final    Comment:  (NOTE) The Xpert SA Assay (FDA approved for NASAL specimens in patients 80 years of age and older), is one component of a comprehensive surveillance program. It is not intended to diagnose infection nor to guide or monitor treatment.      Radiology Studies: No results found. Scheduled Meds: . aspirin  81 mg Oral Daily  . cycloSPORINE  1 drop Both Eyes BID  . docusate sodium  100 mg Oral BID  . levothyroxine  50 mcg Oral QAC breakfast  . methocarbamol  500 mg Oral TID  . metoprolol tartrate  25 mg Oral BID  . rosuvastatin  10 mg Oral q1800  . senna  2 tablet Oral QHS  . sodium chloride flush  3 mL Intravenous Q12H   Continuous Infusions: . sodium chloride Stopped (06/14/17 0927)  . sodium chloride    . nitroGLYCERIN Stopped (06/14/17 0825)     LOS: 2 days    Time spent: > 35 minutes  Penny PiaVEGA, Cayson Kalb, MD [facility] Pager [phone]  If 7PM-7AM, please contact night-coverage [email or web address] Password [email or web address] 06/14/2017, 11:14 AM

## 2017-06-14 NOTE — Progress Notes (Signed)
Patient complaining of feeling lightheaded, took vital signs HR 50 BP 141/60. Patient had a heart c ath this am with Dr. Allyson SabalBerry. Patient currently resting tin the bed with daughter at bedside. Will c all and notify Dr. Cena BentonVega.

## 2017-06-14 NOTE — Discharge Summary (Signed)
Discharge Summary    Patient ID: Kaitlyn Parks,  MRN: [id number], DOB/AGE: 1934/09/16 82 y.o.  Admit date: 06/12/2017 Discharge date: 06/14/2017  Primary Care Provider: Terressa KoyanagiKim, [name] Primary Cardiologist: Dr. Antoine PocheHochrein  Discharge Diagnoses    Principal Problem:   NSTEMI (non-ST elevated myocardial infarction) [facility](HCC) Active Problems:   Unstable angina (HCC)   CAD (coronary artery disease)   GERD (gastroesophageal reflux disease)   HLD (hyperlipidemia)   S/P hip replacement, right   Hypothyroidism, adult   Allergies Allergies  Allergen Reactions  . Acyclovir And Related Other (See Comments)    unknown  . Pravastatin Sodium Other (See Comments)    cystitis  . Zocor [Simvastatin] Other (See Comments)    cystitis    Diagnostic Studies/Procedures    Cath 11/12 Left Anterior Descending  Ost LAD to Prox LAD lesion 0% stenosed  Previously placed Ost LAD to Prox LAD stent (unknown type) is widely patent.  Prox LAD lesion 30% stenosed  Prox LAD lesion is 30% stenosed.  First Diagonal Branch  Ost 1st Diag lesion 95% stenosed  IMPRESSION: Kaitlyn Parks has a widely patent proximal LAD stent with a 90-95% ostial first diagonal branch stenosis and a small to medium-sized vessel. Her other coronary arteries are free of disease. I do not think the diagonal requires intervention. I recommend continued medical therapy.  Diagnostic Diagram          Echo 11/11  - Left ventricle: The cavity size was normal. Wall thickness was increased in a pattern of mild LVH. Systolic function was normal. The estimated ejection fraction was in the range of 60% to 65%. Wall motion was normal; there were no regional wall motion abnormalities. Doppler parameters are consistent with abnormal left ventricular relaxation (grade 1 diastolic dysfunction). - Aortic valve: There was trivial regurgitation. - Mitral valve: There was mild regurgitation. - Right atrium: Central venous pressure  (est): 3 mm Hg. - Tricuspid valve: There was mild regurgitation. - Pulmonary arteries: PA peak pressure: 30 mm Hg (S). - Pericardium, extracardiac: There was no pericardial effusion.  _____________   History of Present Illness     82 y/o caucasian female with a pmhx of CAD s/p DES LAD 04/2010 w/ nl EF, HTN, HLP, Hypothyroidism, admission for R THR 11/08-11/04/2017, who was admitted 06/12/2017 w/ R jaw and neck pain associated w/ nausea, similar to her previous pre-PCI pain.  Hospital Course     Consultants: IM>>Cardiology   Kaitlyn Parks was admitted and treated medically with Lovenox, aspirin, beta-blocker and statin.  She had a CT angiogram of the chest because of her recent surgery to make sure there was no PE.  It was negative.  She had no further jaw pain or neck pain after admission.  Her cardiac enzymes were cycled after admission and were elevated, indicating a non-STEMI.  Cardiac catheterization was recommended and the patient was agreeable to this.  Cardiac catheterization results are above.  The culprit vessel is a small diagonal with 95% stenosis.  Dr. Allyson SabalBerry reviewed the images and felt medical therapy was the best option.  She is already on aspirin, statin and beta-blocker.  The statin cannot be increased to high dose because of history of problems with statin intolerance.  She is tolerating low-dose Crestor at 5 mg/day, continue this.  Beta-blocker was started but cannot being increased because of heart rate limitations.  She was started on Imdur.  Post cath, she was ambulating without chest pain or shortness of breath.  No further inpatient workup is indicated and she is considered stable for discharge, to follow-up as an outpatient.  Because her hip replacement was 4 days ago, she is not being referred to cardiac rehab at this time, discuss in follow-up. _____________  Discharge Vitals Blood pressure (!) 133/53, pulse (!) 52, temperature 98.8 F (37.1 C), temperature source  Oral, resp. rate 12, height 5\' 4"  (1.626 m), weight 136 lb 11 oz (62 kg), SpO2 96 %.  Filed Weights   06/12/17 0423 06/12/17 1000 06/13/17 1806  Weight: 126 lb (57.2 kg) 125 lb 16 oz (57.2 kg) 136 lb 11 oz (62 kg)    Labs & Radiologic Studies    CBC Recent Labs    06/12/17 0428  WBC 9.2  HGB 11.2*  HCT 34.5*  MCV 90.8  PLT 200   Basic Metabolic Panel Recent Labs    16/10/96 0428  NA 136  K 3.7  CL 106  CO2 25  GLUCOSE 110*  BUN 17  CREATININE 0.68  CALCIUM 8.4*   Cardiac Enzymes Recent Labs    06/12/17 0912 06/12/17 1307 06/12/17 1856  TROPONINI 1.68* 2.18* 1.76*   ____________  Dg Chest 2 View  Result Date: 06/12/2017 CLINICAL DATA:  Acute onset of jaw and neck pain. EXAM: CHEST  2 VIEW COMPARISON:  Chest radiograph performed 01/24/2013 FINDINGS: The lungs are hypoexpanded. Mild bibasilar atelectasis is noted. There is no evidence of pleural effusion or pneumothorax. The heart is normal in size; the mediastinal contour is within normal limits. No acute osseous abnormalities are seen. IMPRESSION: Lungs hypoexpanded, with mild bibasilar atelectasis. Electronically Signed   By: Roanna Raider M.D.   On: 06/12/2017 05:06   Ct Angio Chest Pe W And/or Wo Contrast  Result Date: 06/12/2017 CLINICAL DATA:  Acute onset RIGHT jaw and neck pain, nausea at 0130 hours. History of hip surgery this than 1 week ago, scoliosis. EXAM: CT ANGIOGRAPHY CHEST WITH CONTRAST TECHNIQUE: Multidetector CT imaging of the chest was performed using the standard protocol during bolus administration of intravenous contrast. Multiplanar CT image reconstructions and MIPs were obtained to evaluate the vascular anatomy. CONTRAST:  ISOVUE-370 IOPAMIDOL (ISOVUE-370) INJECTION 76% COMPARISON:  Chest radiograph June 12, 2017 at 0447 hours FINDINGS: CARDIOVASCULAR: Adequate contrast opacification of the pulmonary artery's. Main pulmonary artery is not enlarged. No pulmonary arterial filling defects  to the level of the subsegmental branches. Heart size is mildly enlarged, no right heart strain. No pericardial effusion. Thoracic aorta is normal course and caliber, mild calcific atherosclerosis aortic arch. MEDIASTINUM/NODES: No lymphadenopathy by CT size criteria. LUNGS/PLEURA: Tracheobronchial tree is patent, no pneumothorax. No pleural effusions, focal consolidations, pulmonary nodules or masses. Dependent atelectasis. Bandlike atelectasis RIGHT lower lobe. UPPER ABDOMEN: Nonacute. 11 mm benign LEFT adrenal adenoma (0 Hounsfield units). MUSCULOSKELETAL: Nonacute. RIGHT breast coarse calcification. Upper thoracic levoscoliosis. Moderate degenerative change of the thoracic spine. Review of the MIP images confirms the above findings. IMPRESSION: 1. No acute pulmonary embolism. 2. Mild cardiomegaly.  Atelectasis. Aortic Atherosclerosis (ICD10-I70.0). Electronically Signed   By: Awilda Metro M.D.   On: 06/12/2017 06:10    Disposition   Pt is being discharged home today in improved condition.  Follow-up Plans & Appointments    Follow-up Information    Azalee Course, [place] Follow up on 06/23/2017.   Specialties:  Cardiology, Radiology Why:  Please arrive at 1:45 pm for a 2:00 pm appt. Contact information: [address] [place] [address] [phone]  Discharge Instructions    Diet - low sodium heart healthy   Complete by:  As directed    Increase activity slowly   Complete by:  As directed       Discharge Medications   Current Discharge Medication List    START taking these medications   Details  isosorbide mononitrate (IMDUR) 60 MG 24 hr tablet Take 1 tablet (60 mg total) daily by mouth. Qty: 30 tablet, Refills: 11    metoprolol tartrate (LOPRESSOR) 25 MG tablet Take 0.5 tablets (12.5 mg total) 2 (two) times daily by mouth. Qty: 60 tablet, Refills: 11      CONTINUE these medications which have NOT CHANGED   Details  aspirin 81 MG chewable tablet  Chew 1 tablet (81 mg total) 2 (two) times daily by mouth. Qty: 60 tablet, Refills: 1    Biotin w/ Vitamins C & E (HAIR/SKIN/NAILS PO) Take 1 tablet by mouth daily.    cycloSPORINE (RESTASIS) 0.05 % ophthalmic emulsion Place 1 drop 2 (two) times daily into both eyes.    docusate sodium (COLACE) 100 MG capsule Take 1 capsule (100 mg total) 2 (two) times daily by mouth. Qty: 60 capsule, Refills: 1    HYDROcodone-acetaminophen (NORCO/VICODIN) 5-325 MG tablet Take 1-2 tablets every 6 (six) hours as needed by mouth for moderate pain. Qty: 60 tablet, Refills: 0    ketotifen (ZADITOR) 0.025 % ophthalmic solution Place 1 drop 4 (four) times daily as needed into both eyes (allergies).     LIDOCAINE EX Apply 1 patch topically daily as needed (pain).    Multiple Vitamins-Calcium (ONE-A-DAY WOMENS PO) Take 1 tablet by mouth daily.     naproxen sodium (ALEVE) 220 MG tablet Take 220 mg by mouth daily as needed (pain).    nitroGLYCERIN (NITROSTAT) 0.4 MG SL tablet DISSOLVE 1 TABLET UNDER TONGUE IF NEEDED FOR CHEST PAIN. MAY REPEAT IN 5 MINUTES FOR 3 DOSES. Qty: 25 tablet, Refills: 8    ondansetron (ZOFRAN) 4 MG tablet Take 1 tablet (4 mg total) every 6 (six) hours as needed by mouth for nausea. Qty: 20 tablet, Refills: 0    senna (SENOKOT) 8.6 MG TABS tablet Take 2 tablets (17.2 mg total) at bedtime by mouth. Qty: 120 each, Refills: 0    SYNTHROID 50 MCG tablet TAKE 1 TABLET EACH DAY. Qty: 90 tablet, Refills: 1    triamcinolone cream (KENALOG) 0.1 % Apply 1 application topically 2 (two) times daily. Qty: 30 g, Refills: 0   Associated Diagnoses: Dermatitis    rosuvastatin (CRESTOR) 10 MG tablet TAKE (1/2) TABLET DAILY. Qty: 30 tablet, Refills: 4         Aspirin prescribed at discharge?  Yes High Intensity Statin Prescribed? (Lipitor 40-80mg  or Crestor 20-40mg ): No: Intolerance Beta Blocker Prescribed? Yes For EF <40%, was ACEI/ARB Prescribed? No: N/A ADP Receptor Inhibitor Prescribed?  (i.e. Plavix etc.-Includes Medically Managed Patients): No: Recent hip replacement For EF <40%, Aldosterone Inhibitor Prescribed? No: N/A Was EF assessed during THIS hospitalization? Yes Was Cardiac Rehab II ordered? (Included Medically managed Patients): No: Recent hip replacement   Outstanding Labs/Studies   None  Duration of Discharge Encounter   Greater than 30 minutes including physician time.  Melida QuitterSigned, Barrett, Rhonda NP 06/14/2017, 3:24 PM

## 2017-06-14 NOTE — Interval H&P Note (Signed)
Cath Lab Visit (complete for each Cath Lab visit)  Clinical Evaluation Leading to the Procedure:   ACS: Yes.    Non-ACS:    Anginal Classification: CCS III  Anti-ischemic medical therapy: Minimal Therapy (1 class of medications)  Non-Invasive Test Results: No non-invasive testing performed  Prior CABG: No previous CABG      History and Physical Interval Note:  06/14/2017 7:49 AM  Kaitlyn Parks  has presented today for surgery, with the diagnosis of unstable angina  The various methods of treatment have been discussed with the patient and family. After consideration of risks, benefits and other options for treatment, the patient has consented to  Procedure(s): LEFT HEART CATH AND CORONARY ANGIOGRAPHY (N/A) as a surgical intervention .  The patient's history has been reviewed, patient examined, no change in status, stable for surgery.  I have reviewed the patient's chart and labs.  Questions were answered to the patient's satisfaction.     Nanetta BattyBerry, Eros Montour

## 2017-06-14 NOTE — Progress Notes (Signed)
Progress Note  Patient Name: Kaitlyn Parks Date of Encounter: 06/14/2017  Primary Cardiologist: Dr. Antoine PocheHochrein  Subjective   Facial pain last night.  Also complaining of hip pain. No pain at current  Inpatient Medications    Scheduled Meds: . aspirin  81 mg Oral Daily  . cycloSPORINE  1 drop Both Eyes BID  . docusate sodium  100 mg Oral BID  . levothyroxine  50 mcg Oral QAC breakfast  . methocarbamol  500 mg Oral TID  . metoprolol tartrate  25 mg Oral BID  . rosuvastatin  10 mg Oral q1800  . senna  2 tablet Oral QHS  . sodium chloride flush  3 mL Intravenous Q12H   Continuous Infusions: . sodium chloride Stopped (06/14/17 0927)  . sodium chloride    . nitroGLYCERIN Stopped (06/14/17 0825)   PRN Meds: sodium chloride, acetaminophen, HYDROcodone-acetaminophen, morphine injection, ondansetron (ZOFRAN) IV, sodium chloride flush   Vital Signs    Vitals:   06/14/17 0812 06/14/17 0817 06/14/17 0840 06/14/17 1110  BP: (!) 176/92 (!) 174/87 (!) 174/69 (!) 141/60  Pulse: 86 84 79   Resp: 11 10 12    Temp:   98.8 F (37.1 C)   TempSrc:   Oral   SpO2: 94% 95% 96%   Weight:      Height:        Intake/Output Summary (Last 24 hours) at 06/14/2017 1146 Last data filed at 06/14/2017 0653 Gross per 24 hour  Intake 786.9 ml  Output 2250 ml  Net -1463.1 ml   Filed Weights   06/12/17 0423 06/12/17 1000 06/13/17 1806  Weight: 126 lb (57.2 kg) 125 lb 16 oz (57.2 kg) 136 lb 11 oz (62 kg)    Telemetry    NSR, brief run of SVT - Personally Reviewed  ECG    NA - Personally Reviewed  Physical Exam   GEN: No  acute distress.   Neck: No  JVD Cardiac: RRR, no murmurs, rubs, or gallops.  Respiratory: Clear to auscultation bilaterally. GI: Soft, nontender, non-distended, normal bowel sounds  MS:  Right hip bandage dry, right radial cath site OK. No edema Neuro:   Nonfocal  Psych: Oriented and appropriate   Labs    Chemistry Recent Labs  Lab 06/11/17 0500  06/12/17 0428  NA 141 136  K 3.9 3.7  CL 110 106  CO2 24 25  GLUCOSE 121* 110*  BUN 13 17  CREATININE 0.63 0.68  CALCIUM 8.0* 8.4*  GFRNONAA >60 >60  GFRAA >60 >60  ANIONGAP 7 5     Hematology Recent Labs  Lab 06/11/17 0500 06/12/17 0428  WBC 9.6 9.2  RBC 3.65* 3.80*  HGB 11.0* 11.2*  HCT 33.3* 34.5*  MCV 91.2 90.8  MCH 30.1 29.5  MCHC 33.0 32.5  RDW 12.5 12.7  PLT 231 200    Cardiac Enzymes Recent Labs  Lab 06/12/17 0428 06/12/17 0912 06/12/17 1307 06/12/17 1856  TROPONINI 0.34* 1.68* 2.18* 1.76*    Recent Labs  Lab 06/12/17 0440  TROPIPOC 0.42*     BNPNo results for input(s): BNP, PROBNP in the last 168 hours.   DDimer No results for input(s): DDIMER in the last 168 hours.   Radiology    No results found.  Cardiac Studies   Cath 11/12 Left Anterior Descending  Ost LAD to Prox LAD lesion 0% stenosed  Previously placed Ost LAD to Prox LAD stent (unknown type) is widely patent.  Prox LAD lesion 30% stenosed  Prox  LAD lesion is 30% stenosed.  First Diagonal Branch  Ost 1st Diag lesion 95% stenosed   Echo 11/11  - Left ventricle: The cavity size was normal. Wall thickness was   increased in a pattern of mild LVH. Systolic function was normal.   The estimated ejection fraction was in the range of 60% to 65%.   Wall motion was normal; there were no regional wall motion   abnormalities. Doppler parameters are consistent with abnormal   left ventricular relaxation (grade 1 diastolic dysfunction). - Aortic valve: There was trivial regurgitation. - Mitral valve: There was mild regurgitation. - Right atrium: Central venous pressure (est): 3 mm Hg. - Tricuspid valve: There was mild regurgitation. - Pulmonary arteries: PA peak pressure: 30 mm Hg (S). - Pericardium, extracardiac: There was no pericardial effusion.  Patient Profile     82 y.o. female  With elevated troponin post hip surgery.  Had jaw pain.    No acute EKG changes   Assessment &  Plan    NSTEMI:  Results as above.  Ostial diag lesion would be difficult to treat percutaneously.  Suggest continued medical management.  I will add Imdur 60 mg.  We talked about PRN SLNTG as well.  I will have to reduce the beta blocker as she has had some symptomatic bradycardia.   OK to go home from our standpoint.  I have asked for follow up (21st in our office per her request.)    BRADYCARDIA:  Change beta blocker as above.   Signed, Rollene RotundaJames Jaquelyne Firkus, MD  06/14/2017, 11:46 AM

## 2017-06-16 ENCOUNTER — Emergency Department (HOSPITAL_COMMUNITY)
Admission: EM | Admit: 2017-06-16 | Discharge: 2017-06-16 | Disposition: A | Discharge status: Home / Self Care | Payer: Medicare Other | Attending: Emergency Medicine | Admitting: Emergency Medicine

## 2017-06-16 ENCOUNTER — Ambulatory Visit (HOSPITAL_BASED_OUTPATIENT_CLINIC_OR_DEPARTMENT_OTHER)
Admission: RE | Admit: 2017-06-16 | Discharge: 2017-06-16 | Disposition: A | Discharge status: Home / Self Care | Payer: Medicare Other | Source: Ambulatory Visit | Attending: Emergency Medicine | Admitting: Emergency Medicine

## 2017-06-16 ENCOUNTER — Encounter (HOSPITAL_COMMUNITY): Payer: Self-pay | Admitting: Emergency Medicine

## 2017-06-16 ENCOUNTER — Other Ambulatory Visit: Payer: Self-pay

## 2017-06-16 DIAGNOSIS — Z9889 Other specified postprocedural states: Secondary | ICD-10-CM | POA: Insufficient documentation

## 2017-06-16 DIAGNOSIS — Z955 Presence of coronary angioplasty implant and graft: Secondary | ICD-10-CM | POA: Insufficient documentation

## 2017-06-16 DIAGNOSIS — M7918 Myalgia, other site: Secondary | ICD-10-CM

## 2017-06-16 DIAGNOSIS — Z7982 Long term (current) use of aspirin: Secondary | ICD-10-CM | POA: Diagnosis not present

## 2017-06-16 DIAGNOSIS — M79661 Pain in right lower leg: Secondary | ICD-10-CM | POA: Insufficient documentation

## 2017-06-16 DIAGNOSIS — Z96641 Presence of right artificial hip joint: Secondary | ICD-10-CM | POA: Diagnosis not present

## 2017-06-16 DIAGNOSIS — I2510 Atherosclerotic heart disease of native coronary artery without angina pectoris: Secondary | ICD-10-CM | POA: Diagnosis not present

## 2017-06-16 DIAGNOSIS — Z79899 Other long term (current) drug therapy: Secondary | ICD-10-CM | POA: Diagnosis not present

## 2017-06-16 DIAGNOSIS — E039 Hypothyroidism, unspecified: Secondary | ICD-10-CM | POA: Insufficient documentation

## 2017-06-16 DIAGNOSIS — M7989 Other specified soft tissue disorders: Secondary | ICD-10-CM

## 2017-06-16 MED ORDER — ENOXAPARIN SODIUM 60 MG/0.6ML ~~LOC~~ SOLN
1.0000 mg/kg | Freq: Once | SUBCUTANEOUS | Status: AC
  Administered 2017-06-16: 05:00:00 60 mg via SUBCUTANEOUS
  Filled 2017-06-16: qty 0.6

## 2017-06-16 MED ORDER — ENOXAPARIN SODIUM 100 MG/ML ~~LOC~~ SOLN: 1.5000 mg/kg | Freq: Once | SUBCUTANEOUS | Status: DC

## 2017-06-16 MED ORDER — KETOROLAC TROMETHAMINE 30 MG/ML IJ SOLN
30.0000 mg | Freq: Once | INTRAMUSCULAR | Status: AC
  Administered 2017-06-16: 30 mg via INTRAMUSCULAR
  Filled 2017-06-16: qty 1

## 2017-06-16 NOTE — Discharge Instructions (Signed)
·   Start Miralax for constipation

## 2017-06-16 NOTE — ED Provider Notes (Signed)
[facility]-EMERGENCY DEPT Provider Note   CSN: [id number] Arrival date & time: 06/16/17  0140     History   Chief Complaint Chief Complaint  Patient presents with  . Calf Pain    HPI Kaitlyn Parks is a 82 y.o. female.  The history is provided by the patient.  Leg Pain   This is a new problem. The current episode started 3 to 5 hours ago. The problem occurs constantly. The problem has not changed since onset.Pain location: right calf. The pain is at a severity of 8/10. The pain is severe. Pertinent negatives include no numbness, no stiffness, no tingling and no itching. The symptoms are aggravated by activity. Treatments tried: alleve. The treatment provided no relief. History of extremity trauma: had a hip replacement on 11/8. Family history is significant for no gout.    Past Medical History:  Diagnosis Date  . Arthritis    DDD, scoliosis, sees Dr. Lovell SheehanJenkins for this, uses norco very rarely for pain  . CAD (coronary artery disease)    LAD stenting of a 90% lesion 2012  . Cystocele   . Elevated cholesterol   . GERD (gastroesophageal reflux disease)    dx in work up 2016 for atypical CP at OSH   pt. denies  . Heart murmur   . Hypothyroidism   . Interstitial cystitis    sees Dr. Annabell HowellsWrenn  . Osteoporosis   . Rectocele   . Scoliosis   . Thyroid disease    Hypothyroid  . Urinary incontinence    USI  . Uterine prolapse     Patient Active Problem List   Diagnosis Date Noted  . NSTEMI (non-ST elevated myocardial infarction) (HCC)   . Unstable angina (HCC) 06/12/2017  . CAD (coronary artery disease) 06/12/2017  . GERD (gastroesophageal reflux disease) 06/12/2017  . HLD (hyperlipidemia) 06/12/2017  . 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 Hypothyroidism, adult 06/12/2017  . Osteoarthritis of right hip 06/10/2017  . MIXED HYPERLIPIDEMIA 05/12/2010  . CAD, NATIVE VESSEL 05/09/2010  . BACK PAIN, LUMBOSACRAL, CHRONIC 07/23/2009  . Hypothyroidism  06/08/2007  . MITRAL VALVE PROLAPSE 06/08/2007    Past Surgical History:  Procedure Laterality Date  . BLADDER SUSPENSION  2011  . CORONARY ANGIOPLASTY WITH STENT PLACEMENT  04/21/2010   LAD 80%, RCA 30%, nl EF, s/p DES LAD  . OOPHORECTOMY  2011   BSO  . VAGINAL HYSTERECTOMY  2011   [facility] BSO    OB History    Gravida Para Term Preterm AB Living   2 2 2     2    SAB TAB Ectopic Multiple Live Births                   Home Medications    Prior to Admission medications   Medication Sig Start Date End Date Taking? Authorizing Provider  aspirin 81 MG chewable tablet Chew 1 tablet (81 mg total) 2 (two) times daily by mouth. 06/11/17  Yes Swinteck, Arlys JohnBrian, MD  Biotin w/ Vitamins C & E (HAIR/SKIN/NAILS PO) Take 1 tablet by mouth daily.   Yes [provider]  cycloSPORINE (RESTASIS) 0.05 % ophthalmic emulsion Place 1 drop 2 (two) times daily into both eyes.   Yes [provider]  docusate sodium (COLACE) 100 MG capsule Take 1 capsule (100 mg total) 2 (two) times daily by mouth. 06/11/17  Yes Swinteck, Arlys JohnBrian, MD  HYDROcodone-acetaminophen (NORCO/VICODIN) 5-325 MG tablet Take 1-2 tablets every 6 (six) hours as needed by  mouth for moderate pain. 06/11/17  Yes Swinteck, Arlys JohnBrian, MD  isosorbide mononitrate (IMDUR) 60 MG 24 hr tablet Take 1 tablet (60 mg total) daily by mouth. 06/15/17  Yes Barrett, Joline Salthonda G, PA-C  ketotifen (ZADITOR) 0.025 % ophthalmic solution Place 1 drop 4 (four) times daily as needed into both eyes (allergies).    Yes [provider]  LIDOCAINE EX Apply 1 patch topically daily as needed (pain).   Yes [provider]  metoprolol tartrate (LOPRESSOR) 25 MG tablet Take 0.5 tablets (12.5 mg total) 2 (two) times daily by mouth. 06/14/17  Yes Barrett, Rhonda G, PA-C  Multiple Vitamins-Calcium (ONE-A-DAY WOMENS PO) Take 1 tablet by mouth daily.    Yes [provider]  naproxen sodium (ALEVE) 220 MG tablet Take 220 mg by mouth daily as needed  (pain).   Yes [provider]  nitroGLYCERIN (NITROSTAT) 0.4 MG SL tablet DISSOLVE 1 TABLET UNDER TONGUE IF NEEDED FOR CHEST PAIN. MAY REPEAT IN 5 MINUTES FOR 3 DOSES. 02/10/16  Yes Rollene RotundaHochrein, James, MD  ondansetron (ZOFRAN) 4 MG tablet Take 1 tablet (4 mg total) every 6 (six) hours as needed by mouth for nausea. 06/11/17  Yes Swinteck, Arlys JohnBrian, MD  rosuvastatin (CRESTOR) 10 MG tablet TAKE (1/2) TABLET DAILY. 03/24/17  Yes Rollene RotundaHochrein, James, MD  senna (SENOKOT) 8.6 MG TABS tablet Take 2 tablets (17.2 mg total) at bedtime by mouth. 06/11/17  Yes Swinteck, Arlys JohnBrian, MD  SYNTHROID 50 MCG tablet TAKE 1 TABLET EACH DAY. 05/28/17  Yes Kriste BasqueKim, [name], DO  triamcinolone cream (KENALOG) 0.1 % Apply 1 application topically 2 (two) times daily. Patient taking differently: Apply 1 application topically daily as needed (rash).  04/10/14  Yes Terressa KoyanagiKim, [name], DO    Family History Family History  Problem Relation Age of Onset  . Hypertension Mother   . Heart disease Mother   . Heart disease Father   . Heart disease Sister   . Colon cancer Paternal Aunt 1675  . Breast cancer Paternal Aunt        Age 80's  . Diabetes Sister   . Endometrial cancer Sister   . Breast cancer Cousin        Maternal 1st cousins-Age 80's  . Leukemia Paternal Aunt     Social History Social History   Tobacco Use  . Smoking status: Never Smoker  . Smokeless tobacco: Never Used  Substance Use Topics  . Alcohol use: No    Alcohol/week: 0.0 oz    Comment: Rare  . Drug use: No     Allergies   Acyclovir and related; Pravastatin sodium; and Zocor [simvastatin]   Review of Systems Review of Systems  Constitutional: Negative for fever.  Respiratory: Negative for shortness of breath.   Cardiovascular: Positive for leg swelling. Negative for chest pain and palpitations.  Musculoskeletal: Positive for joint swelling and myalgias. Negative for stiffness.  Skin: Negative for itching.  Neurological: Negative for tingling and  numbness.  All other systems reviewed and are negative.    Physical Exam Updated Vital Signs BP (!) 161/80 (BP Location: Right Arm)   Pulse 69   Temp 98.1 F (36.7 C) (Oral)   Resp 13   SpO2 98%   Physical Exam  Constitutional: She is oriented to person, place, and time. She appears well-developed and well-nourished. No distress.  HENT:  Head: Normocephalic and atraumatic.  Nose: Nose normal.  Mouth/Throat: No oropharyngeal exudate.  Eyes: Conjunctivae are normal. Pupils are equal, round, and reactive to light.  Neck: Neck supple.  Cardiovascular: Normal rate, regular rhythm, normal heart sounds and intact distal pulses.  Pulmonary/Chest: Effort normal and breath sounds normal. No stridor. She has no wheezes. She has no rales.  Abdominal: Soft. Bowel sounds are normal. She exhibits no mass. There is no tenderness. There is no rebound and no guarding.  Musculoskeletal: Normal range of motion.  Negative Homan Sign of the right calf ankle and calf are larger on the right 3+ Dorsalis Pedis   Neurological: She is alert and oriented to person, place, and time. She displays normal reflexes.  Skin: Skin is warm and dry. Capillary refill takes less than 2 seconds.  Psychiatric: She has a normal mood and affect.     ED Treatments / Results   Vitals:   06/16/17 0155 06/16/17 0347  BP: (!) 161/74 (!) 161/80  Pulse: 67 68  Resp: 11 12  Temp: 98.1 F (36.7 C)   SpO2: 94% 97%    Radiology No results found.  Procedures Procedures (including critical care time)   435 am case d/w Dr. Shon Baton of orthopedics, can give one dose of lovenox.  Obtain doppler he will communicate with Dr. [name], please have patient call office in am.    Medications Ordered in ED  Medications  ketorolac (TORADOL) 30 MG/ML injection 30 mg (30 mg Intramuscular Given 06/16/17 0424)  enoxaparin (LOVENOX) injection 60 mg (60 mg Subcutaneous Given 06/16/17 0517)   Set up for outpatient Doppler in  am    Final Clinical Impressions(s) / ED Diagnoses    All questions answered to the patient's satisfaction.   Strict return precautions for swelling or the lips or tongue, chest pain, dyspnea on exertion, new weakness or numbness changes in vision or speech, fevers, weakness persistent pain, Inability to tolerate liquids or food, changes in voice cough, altered mental status or any concerns. No signs of systemic illness or infection. The patient is nontoxic-appearing on exam and vital signs are within normal limits.    I have reviewed the triage vital signs and the nursing notes. Pertinent labs &imaging results that were available during my care of the patient were reviewed by me and considered in my medical decision making (see chart for details).  After history, exam, and medical workup I feel the patient has been appropriately medically screened and is safe for discharge home. Pertinent diagnoses were discussed with the patient. Patient was given return precautions.   Edell Mesenbrink, MD 06/16/17 [phone]

## 2017-06-16 NOTE — ED Notes (Signed)
ED Provider at bedside. 

## 2017-06-16 NOTE — Progress Notes (Signed)
Preliminary results by tech - Right Lower Ext. Venous Duplex Completed. Negative for deep and superficial vein thrombosis. Claron Rosencrans, BS, RDMS, RVT  

## 2017-06-16 NOTE — ED Triage Notes (Signed)
Brought in by EMS from home with c/o right calf pain, onset yesterday.  Pt reports progressively worsening right calf pain which also gets worse with minimal movement.  Pt has had a recent right hip surgery.

## 2017-06-23 ENCOUNTER — Ambulatory Visit: Payer: Medicare Other | Admitting: Physician Assistant

## 2017-06-23 ENCOUNTER — Encounter: Payer: Self-pay | Admitting: Physician Assistant

## 2017-06-23 VITALS — BP 138/60 | HR 68 | Ht 65.0 in | Wt 132.0 lb

## 2017-06-23 DIAGNOSIS — I2510 Atherosclerotic heart disease of native coronary artery without angina pectoris: Secondary | ICD-10-CM

## 2017-06-23 DIAGNOSIS — E039 Hypothyroidism, unspecified: Secondary | ICD-10-CM

## 2017-06-23 DIAGNOSIS — E785 Hyperlipidemia, unspecified: Secondary | ICD-10-CM

## 2017-06-23 DIAGNOSIS — I10 Essential (primary) hypertension: Secondary | ICD-10-CM | POA: Diagnosis not present

## 2017-06-23 NOTE — Patient Instructions (Signed)
Medication Instructions:  Your physician recommends that you continue on your current medications as directed. Please refer to the Current Medication list given to you today.  Labwork: None   Testing/Procedures: None   Follow-Up: Your physician recommends that you schedule a follow-up appointment in: 3 months with Dr Hochrein.  Any Other Special Instructions Will Be Listed Below (If Applicable). If you need a refill on your cardiac medications before your next appointment, please call your pharmacy.  

## 2017-06-23 NOTE — Progress Notes (Signed)
Cardiology Office Note    Date:  06/25/2017   ID:  Kaitlyn Parks 1935-06-09, MRN [id number]  PCP:  Terressa Koyanagi, DO  Cardiologist:  Dr. Antoine Poche  Chief Complaint  Patient presents with  . Follow-up    seen for Dr. Antoine Poche.     History of Present Illness:  Kaitlyn Parks is a 82 y.o. female with PMH of hypothyroidism, HTN, HLD and CAD. She had a PCI of LAD in 2012. She recently underwent total right hip arthroplasty. She presented to the hospital on 06/12/2017 with sudden onset of right-sided neck pain with radiation to the right jaw. CTA of the chest was negative for PE however troponin was elevated at 0.42. Echocardiogram obtained on 06/13/2017 showed EF 60-65%, mild LVH, grade 1 DD, mild MR, mild TR, peak PA pressure 30 mmHg. Cardiac catheterization performed on 06/14/2017 showed 95% ostial D1 lesion, widely patent ostial LAD stent, 30% proximal LAD stenosis. Medical therapy was recommended. Imdur 60 mg daily was added to her medical regimen. Beta blocker was initially increased and later reduced due to symptomatic bradycardia. Since discharge, patient did go back to the ED again on 06/16/2017 with chest pain. No further workup was done. A lower extremity venous Doppler obtained on the same day was negative.  She presents today for cardiology office visit. She denies any further chest discomfort or shortness breath. She is still having trouble moving around due to the recent hip surgery. She also has some pain near the surgical area. She has one out of 6 murmur on physical exam consistent with mitral regurgitation. This is quite stable on recent echocardiogram. She is doing well on the current dose of Imdur. Her blood pressure is mildly elevated today, however normally it runs borderline. I think her blood pressure elevation is due to pain. She can follow-up with Dr. Antoine Poche in 3 month. Previous lipid panel reviewed from July. Cholesterol panel showed mildly elevated LDL and  triglyceride, however she had cystitis on higher dose of statin in the past.    Past Medical History:  Diagnosis Date  . Arthritis    DDD, scoliosis, sees Dr. Lovell Sheehan for this, uses norco very rarely for pain  . CAD (coronary artery disease)    LAD stenting of a 90% lesion 2012  . Cystocele   . Elevated cholesterol   . GERD (gastroesophageal reflux disease)    dx in work up 2016 for atypical CP at OSH   pt. denies  . Heart murmur   . Hypothyroidism   . Interstitial cystitis    sees Dr. [name]  . Osteoporosis   . Rectocele   . Scoliosis   . Thyroid disease    Hypothyroid  . Urinary incontinence    USI  . Uterine prolapse     Past Surgical History:  Procedure Laterality Date  . BLADDER SUSPENSION  2011  . CORONARY ANGIOPLASTY WITH STENT PLACEMENT  04/21/2010   LAD 80%, RCA 30%, nl EF, s/p DES LAD  . LEFT HEART CATH AND CORONARY ANGIOGRAPHY N/A 06/14/2017   Procedure: LEFT HEART CATH AND CORONARY ANGIOGRAPHY;  Surgeon: Runell Gess, MD;  Location: [facility] INVASIVE [facility] LAB;  Service: Cardiovascular;  Laterality: N/A;  . OOPHORECTOMY  2011   BSO  . TOTAL HIP ARTHROPLASTY Right 06/10/2017   Procedure: RIGHT TOTAL HIP ARTHROPLASTY ANTERIOR APPROACH;  Surgeon: Samson Frederic, MD;  Location: [facility];  Service: Orthopedics;  Laterality: Right;  Needs RNFA  . VAGINAL HYSTERECTOMY  2011  [facility] BSO    Current Medications: Outpatient Medications Prior to Visit  Medication Sig Dispense Refill  . aspirin 81 MG chewable tablet Chew 1 tablet (81 mg total) 2 (two) times daily by mouth. 60 tablet 1  . Biotin w/ Vitamins C & E (HAIR/SKIN/NAILS PO) Take 1 tablet by mouth daily.    . cycloSPORINE (RESTASIS) 0.05 % ophthalmic emulsion Place 1 drop 2 (two) times daily into both eyes.    Marland Kitchen docusate sodium (COLACE) 100 MG capsule Take 1 capsule (100 mg total) 2 (two) times daily by mouth. 60 capsule 1  . HYDROcodone-acetaminophen (NORCO/VICODIN) 5-325 MG tablet Take 1-2 tablets every 6 (six)  hours as needed by mouth for moderate pain. 60 tablet 0  . isosorbide mononitrate (IMDUR) 60 MG 24 hr tablet Take 1 tablet (60 mg total) daily by mouth. 30 tablet 11  . ketotifen (ZADITOR) 0.025 % ophthalmic solution Place 1 drop 4 (four) times daily as needed into both eyes (allergies).     Marland Kitchen LIDOCAINE EX Apply 1 patch topically daily as needed (pain).    . metoprolol tartrate (LOPRESSOR) 25 MG tablet Take 0.5 tablets (12.5 mg total) 2 (two) times daily by mouth. 60 tablet 11  . Multiple Vitamins-Calcium (ONE-A-DAY WOMENS PO) Take 1 tablet by mouth daily.     . naproxen sodium (ALEVE) 220 MG tablet Take 220 mg by mouth daily as needed (pain).    . nitroGLYCERIN (NITROSTAT) 0.4 MG SL tablet DISSOLVE 1 TABLET UNDER TONGUE IF NEEDED FOR CHEST PAIN. MAY REPEAT IN 5 MINUTES FOR 3 DOSES. 25 tablet 8  . ondansetron (ZOFRAN) 4 MG tablet Take 1 tablet (4 mg total) every 6 (six) hours as needed by mouth for nausea. 20 tablet 0  . rosuvastatin (CRESTOR) 10 MG tablet TAKE (1/2) TABLET DAILY. 30 tablet 4  . senna (SENOKOT) 8.6 MG TABS tablet Take 2 tablets (17.2 mg total) at bedtime by mouth. 120 each 0  . SYNTHROID 50 MCG tablet TAKE 1 TABLET EACH DAY. 90 tablet 1  . triamcinolone cream (KENALOG) 0.1 % Apply 1 application topically 2 (two) times daily. (Patient taking differently: Apply 1 application topically daily as needed (rash). ) 30 g 0   No facility-administered medications prior to visit.      Allergies:   Acyclovir and related; Pravastatin sodium; and Zocor [simvastatin]   Social History   Socioeconomic History  . Marital status: Widowed    Spouse name: None  . Number of children: None  . Years of education: None  . Highest education level: None  Social Needs  . Financial resource strain: None  . Food insecurity - worry: None  . Food insecurity - inability: None  . Transportation needs - medical: None  . Transportation needs - non-medical: None  Occupational History  . None  Tobacco  Use  . Smoking status: Never Smoker  . Smokeless tobacco: Never Used  Substance and Sexual Activity  . Alcohol use: No    Alcohol/week: 0.0 oz    Comment: Rare  . Drug use: No  . Sexual activity: No    Birth control/protection: Surgical  Other Topics Concern  . None  Social History Narrative   Work or School:  none      Home Situation: lives with husband      Spiritual Beliefs: [employer]      Lifestyle: regular exercise (yoga, water aerobics); diet is healthy        Family History:  The patient's family history includes Breast  cancer in her cousin and paternal aunt; Colon cancer (age of onset: 3975) in her paternal aunt; Diabetes in her sister; Endometrial cancer in her sister; Heart disease in her father, mother, and sister; Hypertension in her mother; Leukemia in her paternal aunt.   ROS:   Please see the history of present illness.    ROS All other systems reviewed and are negative.   PHYSICAL EXAM:   VS:  BP 138/60   Pulse 68   Ht 5\' 5"  (1.651 m)   Wt 132 lb (59.9 kg)   BMI 21.97 kg/m    GEN: Well nourished, well developed, in no acute distress  HEENT: normal  Neck: no JVD, carotid bruits, or masses Cardiac: RRR; no murmurs, rubs, or gallops,no edema  Respiratory:  clear to auscultation bilaterally, normal work of breathing GI: soft, nontender, nondistended, + BS MS: no deformity or atrophy  Skin: warm and dry, no rash Neuro:  Alert and Oriented x 3, Strength and sensation are intact Psych: euthymic mood, full affect  Wt Readings from Last 3 Encounters:  06/23/17 132 lb (59.9 kg)  06/13/17 136 lb 11 oz (62 kg)  06/10/17 126 lb (57.2 kg)      Studies/Labs Reviewed:   EKG:  EKG is not ordered today.   Recent Labs: 02/05/2017: ALT 14 04/30/2017: TSH 1.31 06/12/2017: BUN 17; Creatinine, Ser 0.68; Hemoglobin 11.2; Platelets 200; Potassium 3.7; Sodium 136   Lipid Panel    Component Value Date/Time   CHOL 160 02/05/2017 0834   TRIG 154 (H) 02/05/2017 0834     HDL 52 02/05/2017 0834   CHOLHDL 3.1 02/05/2017 0834   CHOLHDL 3.0 01/29/2016 0811   VLDL 20 01/29/2016 0811   LDLCALC 77 02/05/2017 0834   LDLDIRECT 140.6 05/25/2012 0855    Additional studies/ records that were reviewed today include:   Echo 06/13/2017 LV EF: 60% -   65%  Study Conclusions  - Left ventricle: The cavity size was normal. Wall thickness was   increased in a pattern of mild LVH. Systolic function was normal.   The estimated ejection fraction was in the range of 60% to 65%.   Wall motion was normal; there were no regional wall motion   abnormalities. Doppler parameters are consistent with abnormal   left ventricular relaxation (grade 1 diastolic dysfunction). - Aortic valve: There was trivial regurgitation. - Mitral valve: There was mild regurgitation. - Right atrium: Central venous pressure (est): 3 mm Hg. - Tricuspid valve: There was mild regurgitation. - Pulmonary arteries: PA peak pressure: 30 mm Hg (S). - Pericardium, extracardiac: There was no pericardial effusion.  Impressions:  - Mild LVH with LVEF 60-65% and grade 1 diastolic dysfunction. Mild   mitral regurgitation. Trivial aortic regurgitation. Mild   tricuspid regurgitation with estimated PASP 30 mmHg.    Cath 06/14/2017 Conclusion     Ost 1st Diag lesion is 95% stenosed.  Previously placed Ost LAD to Prox LAD stent (unknown type) is widely patent.  Prox LAD lesion is 30% stenosed.   IMPRESSION: Kaitlyn Parks has a widely patent proximal LAD stent with a 90-95% ostial first diagonal branch stenosis and a small to medium-sized vessel. Her other coronary arteries are free of disease. I do not think the diagonal requires intervention. I recommend continued medical therapy. The sheath was removed and a small TR band was placed on the right wrist to achieve hemostasis. The patient left the lab in stable condition.   LE venous doppler 06/16/2017 Final Interpretation Right: There  is no  evidence of deep vein thrombosis in the lower extremity.There is no evidence of superficial venous thrombosis. Left: No evidence of common femoral vein obstruction.  ASSESSMENT:    1. Coronary artery disease involving native coronary artery of native heart without angina pectoris   2. Hyperlipidemia, unspecified hyperlipidemia type   3. Essential hypertension   4. Hypothyroidism, unspecified type      PLAN:  In order of problems listed above:  1. CAD: Recently underwent cardiac catheterization which showed 95% ostial diagonal lesion, widely patent stent in the LAD. She denies any recent chest discomfort since discharge. She is on aspirin, Imdur, metoprolol, and Crestor. Metoprolol was unable to be up titrated due to significant bradycardia on higher dose.  2. HTN: Blood pressure stable  3. HLD: Continue Crestor, last lipid panel obtained in July 2018 showed total cholesterol 160, triglycerides 154, HDL 52, LDL 77. She is unable to tolerate higher dose of statin due to side effect of cystitis.  4. Hypothyroidism: Continue Synthroid. Managed by primary care provider.   Medication Adjustments/Labs and Tests Ordered: Current medicines are reviewed at length with the patient today.  Concerns regarding medicines are outlined above.  Medication changes, Labs and Tests ordered today are listed in the Patient Instructions below. Patient Instructions  Medication Instructions:  Your physician recommends that you continue on your current medications as directed. Please refer to the Current Medication list given to you today.  Labwork: None   Testing/Procedures: None   Follow-Up: Your physician recommends that you schedule a follow-up appointment in: 3 months with Dr Antoine PocheHochrein  Any Other Special Instructions Will Be Listed Below (If Applicable).   If you need a refill on your cardiac medications before your next appointment, please call your pharmacy.     Ramond DialSigned, Shalyn Koral, [place]    06/25/2017 12:43 PM    [facility] [address], [place], [place]  [address] Phone: [phone]; Fax: [phone]

## 2017-06-25 ENCOUNTER — Encounter: Payer: Self-pay | Admitting: Physician Assistant

## 2017-07-05 ENCOUNTER — Telehealth: Payer: Self-pay | Admitting: *Deleted

## 2017-07-05 NOTE — Telephone Encounter (Signed)
Called patient to follow-up. She has an appointment with her opthalmologist today. No further needs at this time.

## 2017-07-05 NOTE — Telephone Encounter (Signed)
Patient Name: Kaitlyn Parks Meister Gender: Female DOB: 09/28/1934 Age: 89 Y 3 M 7 D Return Phone Number: [phone] (Primary), [phone] (Secondary) Address: [place]:  [place] [id number] Client [facility] Night - Client Client Site [facility] - Night Physician Kriste BasqueKim, Hannah- MD Contact Type Call Who Is Calling Patient / Member / Family / Caregiver Call Type Triage / Clinical Relationship To Patient Self Return Phone Number [phone] (Primary) Chief Complaint Eye Pus Or Discharge Reason for Call Symptomatic / Request for Health Information Initial Comment Caller Kaitlyn Parks Mckelvy is recovering from hip and heart issues and can not drive. Pt believes they have pink eye Translation No Nurse Assessment Nurse: Cockrum, RN, Margaret Date/Time (Eastern Time): 07/03/2017 8:56:17 AM Confirm and document reason for call. If symptomatic, describe symptoms. ---Caller states that she is recovering from hip and heart issues and she cant drive. She is having redness in her right eye and yellow hard crusty in the corner of the eye. Denies fever. Symptoms started last night. Does the patient have any new or worsening symptoms? ---Yes Will a triage be completed? ---Yes Related visit to physician within the last 2 weeks? ---No Does the PT have any chronic conditions? (i.e. diabetes, asthma, etc.) ---Yes List chronic conditions. ---heart disease, Is this a behavioral health or substance abuse call? ---No Guidelines Guideline Title Affirmed Question Affirmed Notes Nurse Date/Time (Eastern Time) Eye - Red Without Pus [1] Red eye AND [2] no blurred vision AND [3] minimal or no pain Cockrum, RN, Claris CheMargaret 07/03/2017 8:59:36 AM Disp. Time Lamount Cohen(Eastern Time) Disposition Final User 07/03/2017 9:06:12 AM Home Care Yes Cockrum, RN, Ruel FavorsMargaret Caller Disagree/Comply Comply PLEASE NOTE: All timestamps contained within this report are represented as [place]  Time. CONFIDENTIALTY NOTICE: This fax transmission is intended only for the addressee. It contains information that is legally privileged, confidential or otherwise protected from use or disclosure. If you are not the intended recipient, you are strictly prohibited from reviewing, disclosing, copying using or disseminating any of this information or taking any action in reliance on or regarding this information. If you have received this fax in error, please notify us immediately by telephone so that we can arrange for its return to us. Phone: [phone], Toll-Free: [phone], Fax: [phone] Page: 2 of 2 Call Id: [id number] Caller Understands Yes PreDisposition Go to Urgent Care/Walk-In Clinic Care Advice Given Per Guideline HOME CARE: You should be able to treat this at home. REASSURANCE AND EDUCATION: * You have told me that you have had no worsening of your vision and that there is no or only mild discomfort. This is reassuring. * One cause of minor eye redness is pink-eye (viral conjunctivitis). People with pinkeye may often note mild eye irritation and a water discharge. Often it affects both eyes. Colds can also cause a small amount of mucus to collect in the inner corner of the eye. It generally isn't serious. EYELID CLEANSING: * Cleanse your eyelids with warm water and a clean cotton ball at least every 2 hours while awake. * This usually will keep a bacterial infection from occurring. ARTIFICIAL TEARS: * Artificial tears often make red eyes feel better. Use 1 drop per eye three times a day. Use them after cleansing the eyelids. * Antibiotic and vasoconstrictor eyedrops do not help viral eye infections. CONTAGIOUSNESS: Pink-eye is contagious. Try not to touch your eyes. Wash your hands frequently. Do not share towels. EXPECTED COURSE: Pinkeye with a cold usually lasts about 7 days. CALL BACK IF: *  Yellow or green pus/discharge from eye occurs * Blurred vision occurs * Redness lasts  over 7 days * You become worse. CARE ADVICE given per Eye - Red without Pus (Adult) guideline.

## 2017-07-12 ENCOUNTER — Emergency Department (HOSPITAL_COMMUNITY): Payer: Medicare Other

## 2017-07-12 ENCOUNTER — Emergency Department (HOSPITAL_COMMUNITY)
Admission: EM | Admit: 2017-07-12 | Discharge: 2017-07-12 | Disposition: A | Discharge status: Home / Self Care | Payer: Medicare Other | Attending: Emergency Medicine | Admitting: Emergency Medicine

## 2017-07-12 ENCOUNTER — Encounter (HOSPITAL_COMMUNITY): Payer: Self-pay | Admitting: Emergency Medicine

## 2017-07-12 DIAGNOSIS — R1013 Epigastric pain: Secondary | ICD-10-CM | POA: Diagnosis not present

## 2017-07-12 DIAGNOSIS — R109 Unspecified abdominal pain: Secondary | ICD-10-CM | POA: Diagnosis present

## 2017-07-12 DIAGNOSIS — I2510 Atherosclerotic heart disease of native coronary artery without angina pectoris: Secondary | ICD-10-CM | POA: Diagnosis not present

## 2017-07-12 DIAGNOSIS — E039 Hypothyroidism, unspecified: Secondary | ICD-10-CM | POA: Diagnosis not present

## 2017-07-12 DIAGNOSIS — Z7982 Long term (current) use of aspirin: Secondary | ICD-10-CM | POA: Insufficient documentation

## 2017-07-12 DIAGNOSIS — Z96641 Presence of right artificial hip joint: Secondary | ICD-10-CM | POA: Insufficient documentation

## 2017-07-12 DIAGNOSIS — Z79899 Other long term (current) drug therapy: Secondary | ICD-10-CM | POA: Insufficient documentation

## 2017-07-12 LAB — BASIC METABOLIC PANEL
ANION GAP: 7 (ref 5–15)
BUN: 16 mg/dL (ref 6–20)
CALCIUM: 9 mg/dL (ref 8.9–10.3)
CO2: 27 mmol/L (ref 22–32)
CREATININE: 0.76 mg/dL (ref 0.44–1.00)
Chloride: 104 mmol/L (ref 101–111)
GFR calc Af Amer: >60 mL/min (ref >60)
GLUCOSE: 106 mg/dL — AB (ref 65–99)
Potassium: 3.5 mmol/L (ref 3.5–5.1)
Sodium: 138 mmol/L (ref 135–145)

## 2017-07-12 LAB — HEPATIC FUNCTION PANEL
ALK PHOS: 64 U/L (ref 38–126)
ALT: 12 U/L — AB (ref 14–54)
AST: 18 U/L (ref 15–41)
Albumin: 3.5 g/dL (ref 3.5–5.0)
TOTAL PROTEIN: 6.2 g/dL — AB (ref 6.5–8.1)
Total Bilirubin: 0.2 mg/dL — ABNORMAL LOW (ref 0.3–1.2)

## 2017-07-12 LAB — CBC
HCT: 34.9 % — ABNORMAL LOW (ref 36.0–46.0)
HEMOGLOBIN: 11.1 g/dL — AB (ref 12.0–15.0)
MCH: 29.4 pg (ref 26.0–34.0)
MCHC: 31.8 g/dL (ref 30.0–36.0)
MCV: 92.6 fL (ref 78.0–100.0)
PLATELETS: 260 10*3/uL (ref 150–400)
RBC: 3.77 MIL/uL — ABNORMAL LOW (ref 3.87–5.11)
RDW: 13 % (ref 11.5–15.5)
WBC: 5.3 10*3/uL (ref 4.0–10.5)

## 2017-07-12 LAB — I-STAT CHEM 8, ED
BUN: 17 mg/dL (ref 6–20)
CALCIUM ION: 1.23 mmol/L (ref 1.15–1.40)
CREATININE: 0.7 mg/dL (ref 0.44–1.00)
Chloride: 102 mmol/L (ref 101–111)
GLUCOSE: 105 mg/dL — AB (ref 65–99)
HCT: 34 % — ABNORMAL LOW (ref 36.0–46.0)
HEMOGLOBIN: 11.6 g/dL — AB (ref 12.0–15.0)
POTASSIUM: 3.5 mmol/L (ref 3.5–5.1)
Sodium: 141 mmol/L (ref 135–145)
TCO2: 26 mmol/L (ref 22–32)

## 2017-07-12 LAB — I-STAT TROPONIN, ED: TROPONIN I, POC: 0.01 ng/mL (ref 0.00–0.08)

## 2017-07-12 LAB — LIPASE, BLOOD: LIPASE: 23 U/L (ref 11–51)

## 2017-07-12 MED ORDER — MORPHINE SULFATE (PF) 4 MG/ML IV SOLN
2.0000 mg | Freq: Once | INTRAVENOUS | Status: DC
  Filled 2017-07-12: qty 1

## 2017-07-12 MED ORDER — SODIUM CHLORIDE 0.9 % IV BOLUS (SEPSIS)
1000.0000 mL | Freq: Once | INTRAVENOUS | Status: AC
  Administered 2017-07-12: 1000 mL via INTRAVENOUS

## 2017-07-12 MED ORDER — ONDANSETRON HCL 4 MG/2ML IJ SOLN
4.0000 mg | Freq: Once | INTRAMUSCULAR | Status: AC
  Administered 2017-07-12: 4 mg via INTRAVENOUS
  Filled 2017-07-12: qty 2

## 2017-07-12 MED ORDER — IOPAMIDOL (ISOVUE-300) INJECTION 61%
INTRAVENOUS | Status: AC
  Administered 2017-07-12: 100 mL
  Filled 2017-07-12: qty 100

## 2017-07-12 MED ORDER — ALUM & MAG HYDROXIDE-SIMETH 200-200-20 MG/5ML PO SUSP
15.0000 mL | Freq: Once | ORAL | Status: AC
  Administered 2017-07-12: 15 mL via ORAL
  Filled 2017-07-12: qty 30

## 2017-07-12 NOTE — ED Triage Notes (Signed)
Patient arrives from home with complaint of chest pain and RUQ abd pain. Given 4x NTG and 324mg  ASA PTA. Some relief of chest pain with reduction from 10 to 6 in pain. Hx of prior MI. Denies history of similar abdominal pain.

## 2017-07-12 NOTE — ED Provider Notes (Signed)
[facility] EMERGENCY DEPARTMENT Provider Note   CSN: [id number] Arrival date & time: 07/12/17  0156     History   Chief Complaint Chief Complaint  Patient presents with  . Chest Pain  . Abdominal Pain    HPI Kaitlyn Parks is a 82 y.o. female.  82 yo F with a cc of abdominal pain.  Sharp and shooting, goes from the left upper quadrant to the back.  Nothing seems to make it better or worse.  Denies fevers or vomiting.  Denies sob.  Thinks feels different than prior heart attack pain.    The history is provided by the patient.  Chest Pain   Associated symptoms include abdominal pain. Pertinent negatives include no dizziness, no fever, no headaches, no nausea, no palpitations, no shortness of breath and no vomiting.  Abdominal Pain   This is a new problem. The current episode started 6 to 12 hours ago. The problem occurs constantly. The problem has not changed since onset.The pain is located in the epigastric region. The quality of the pain is sharp and shooting. The pain is at a severity of 10/10. The pain is severe. Pertinent negatives include fever, nausea, vomiting, dysuria, headaches, arthralgias and myalgias. Nothing aggravates the symptoms. Nothing relieves the symptoms.    Past Medical History:  Diagnosis Date  . Arthritis    DDD, scoliosis, sees Dr. Lovell Sheehan for this, uses norco very rarely for pain  . CAD (coronary artery disease)    LAD stenting of a 90% lesion 2012  . Cystocele   . Elevated cholesterol   . GERD (gastroesophageal reflux disease)    dx in work up 2016 for atypical CP at OSH   pt. denies  . Heart murmur   . Hypothyroidism   . Interstitial cystitis    sees Dr. [name]  . Osteoporosis   . Rectocele   . Scoliosis   . Thyroid disease    Hypothyroid  . Urinary incontinence    USI  . Uterine prolapse     Patient Active Problem List   Diagnosis Date Noted  . NSTEMI (non-ST elevated myocardial infarction) (HCC)   . Unstable  angina (HCC) 06/12/2017  . CAD (coronary artery disease) 06/12/2017  . GERD (gastroesophageal reflux disease) 06/12/2017  . HLD (hyperlipidemia) 06/12/2017  . 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 Hypothyroidism, adult 06/12/2017  . Osteoarthritis of right hip 06/10/2017  . MIXED HYPERLIPIDEMIA 05/12/2010  . CAD, NATIVE VESSEL 05/09/2010  . BACK PAIN, LUMBOSACRAL, CHRONIC 07/23/2009  . Hypothyroidism 06/08/2007  . MITRAL VALVE PROLAPSE 06/08/2007    Past Surgical History:  Procedure Laterality Date  . BLADDER SUSPENSION  2011  . CORONARY ANGIOPLASTY WITH STENT PLACEMENT  04/21/2010   LAD 80%, RCA 30%, nl EF, s/p DES LAD  . LEFT HEART CATH AND CORONARY ANGIOGRAPHY N/A 06/14/2017   Procedure: LEFT HEART CATH AND CORONARY ANGIOGRAPHY;  Surgeon: Runell Gess, MD;  Location: [facility] INVASIVE CV LAB;  Service: Cardiovascular;  Laterality: N/A;  . OOPHORECTOMY  2011   BSO  . TOTAL HIP ARTHROPLASTY Right 06/10/2017   Procedure: RIGHT TOTAL HIP ARTHROPLASTY ANTERIOR APPROACH;  Surgeon: Samson Frederic, MD;  Location: [facility] ORS;  Service: Orthopedics;  Laterality: Right;  Needs RNFA  . VAGINAL HYSTERECTOMY  2011   LAVH BSO    OB History    Gravida Para Term Preterm AB Living   2 2 2     2    SAB TAB Ectopic Multiple Live Births  Home Medications    Prior to Admission medications   Medication Sig Start Date End Date Taking? Authorizing Provider  aspirin 81 MG chewable tablet Chew 1 tablet (81 mg total) 2 (two) times daily by mouth. 06/11/17   Swinteck, Arlys John, MD  Biotin w/ Vitamins C & E (HAIR/SKIN/NAILS PO) Take 1 tablet by mouth daily.    [provider]  cycloSPORINE (RESTASIS) 0.05 % ophthalmic emulsion Place 1 drop 2 (two) times daily into both eyes.    [provider]  docusate sodium (COLACE) 100 MG capsule Take 1 capsule (100 mg total) 2 (two) times daily by mouth. 06/11/17   Swinteck, Arlys John, MD  HYDROcodone-acetaminophen (NORCO/VICODIN) 5-325  MG tablet Take 1-2 tablets every 6 (six) hours as needed by mouth for moderate pain. 06/11/17   Swinteck, Arlys John, MD  isosorbide mononitrate (IMDUR) 60 MG 24 hr tablet Take 1 tablet (60 mg total) daily by mouth. 06/15/17   Barrett, Joline Salt, PA-C  ketotifen (ZADITOR) 0.025 % ophthalmic solution Place 1 drop 4 (four) times daily as needed into both eyes (allergies).     [provider]  LIDOCAINE EX Apply 1 patch topically daily as needed (pain).    [provider]  metoprolol tartrate (LOPRESSOR) 25 MG tablet Take 0.5 tablets (12.5 mg total) 2 (two) times daily by mouth. 06/14/17   Barrett, Joline Salt, PA-C  Multiple Vitamins-Calcium (ONE-A-DAY WOMENS PO) Take 1 tablet by mouth daily.     [provider]  naproxen sodium (ALEVE) 220 MG tablet Take 220 mg by mouth daily as needed (pain).    [provider]  nitroGLYCERIN (NITROSTAT) 0.4 MG SL tablet DISSOLVE 1 TABLET UNDER TONGUE IF NEEDED FOR CHEST PAIN. MAY REPEAT IN 5 MINUTES FOR 3 DOSES. 02/10/16   Rollene Rotunda, MD  ondansetron (ZOFRAN) 4 MG tablet Take 1 tablet (4 mg total) every 6 (six) hours as needed by mouth for nausea. 06/11/17   Swinteck, Arlys John, MD  rosuvastatin (CRESTOR) 10 MG tablet TAKE (1/2) TABLET DAILY. 03/24/17   Rollene Rotunda, MD  senna (SENOKOT) 8.6 MG TABS tablet Take 2 tablets (17.2 mg total) at bedtime by mouth. 06/11/17   Swinteck, Arlys John, MD  SYNTHROID 50 MCG tablet TAKE 1 TABLET EACH DAY. 05/28/17   Terressa Koyanagi, DO  triamcinolone cream (KENALOG) 0.1 % Apply 1 application topically 2 (two) times daily. Patient taking differently: Apply 1 application topically daily as needed (rash).  04/10/14   Terressa Koyanagi, DO    Family History Family History  Problem Relation Age of Onset  . Hypertension Mother   . Heart disease Mother   . Heart disease Father   . Heart disease Sister   . Colon cancer Paternal Aunt 91  . Breast cancer Paternal Aunt        Age 32's  . Diabetes Sister   . Endometrial  cancer Sister   . Breast cancer Cousin        Maternal 1st cousins-Age 29's  . Leukemia Paternal Aunt     Social History Social History   Tobacco Use  . Smoking status: Never Smoker  . Smokeless tobacco: Never Used  Substance Use Topics  . Alcohol use: No    Alcohol/week: 0.0 oz    Comment: Rare  . Drug use: No     Allergies   Acyclovir and related; Pravastatin sodium; and Zocor [simvastatin]   Review of Systems Review of Systems  Constitutional: Negative for chills and fever.  HENT: Negative for congestion and  rhinorrhea.   Eyes: Negative for redness and visual disturbance.  Respiratory: Negative for shortness of breath and wheezing.   Cardiovascular: Positive for chest pain. Negative for palpitations.  Gastrointestinal: Positive for abdominal pain. Negative for nausea and vomiting.  Genitourinary: Negative for dysuria and urgency.  Musculoskeletal: Negative for arthralgias and myalgias.  Skin: Negative for pallor and wound.  Neurological: Negative for dizziness and headaches.     Physical Exam Updated Vital Signs BP (!) 155/76   Pulse 67   Temp 97.7 F (36.5 C) (Oral)   Resp 16   SpO2 96%   Physical Exam  Constitutional: She is oriented to person, place, and time. She appears well-developed and well-nourished. No distress.  HENT:  Head: Normocephalic and atraumatic.  Eyes: EOM are normal. Pupils are equal, round, and reactive to light.  Neck: Normal range of motion. Neck supple.  Cardiovascular: Normal rate and regular rhythm. Exam reveals no gallop and no friction rub.  No murmur heard. Pulmonary/Chest: Effort normal. She has no decreased breath sounds. She has no wheezes. She has no rales.  Abdominal: Soft. She exhibits no distension. There is tenderness in the epigastric area.  Musculoskeletal: She exhibits no edema or tenderness.  Neurological: She is alert and oriented to person, place, and time.  Skin: Skin is warm and dry. She is not diaphoretic.    Psychiatric: She has a normal mood and affect. Her behavior is normal.  Nursing note and vitals reviewed.    ED Treatments / Results  Labs (all labs ordered are listed, but only abnormal results are displayed) Labs Reviewed  BASIC METABOLIC PANEL - Abnormal; Notable for the following components:      Result Value   Glucose, Bld 106 (*)    All other components within normal limits  CBC - Abnormal; Notable for the following components:   RBC 3.77 (*)    Hemoglobin 11.1 (*)    HCT 34.9 (*)    All other components within normal limits  HEPATIC FUNCTION PANEL - Abnormal; Notable for the following components:   Total Protein 6.2 (*)    ALT 12 (*)    Total Bilirubin 0.2 (*)    Bilirubin, Direct <0.1 (*)    All other components within normal limits  I-STAT CHEM 8, ED - Abnormal; Notable for the following components:   Glucose, Bld 105 (*)    Hemoglobin 11.6 (*)    HCT 34.0 (*)    All other components within normal limits  LIPASE, BLOOD  I-STAT TROPONIN, ED    EKG  EKG Interpretation  Date/Time:  Monday July 12 2017 02:04:25 EST Ventricular Rate:  65 PR Interval:  182 QRS Duration: 94 QT Interval:  438 QTC Calculation: 455 R Axis:   84 Text Interpretation:  Normal sinus rhythm Moderate voltage criteria for LVH, may be normal variant Nonspecific ST abnormality Abnormal ECG voltage increased Otherwise no significant change Confirmed by Melene PlanFloyd, Harwood Nall [phone]([id number]) on 07/12/2017 2:14:05 AM       Radiology Dg Chest 2 View  Result Date: 07/12/2017 CLINICAL DATA:  Acute onset of generalized chest pain and shortness of breath. EXAM: CHEST  2 VIEW COMPARISON:  Chest radiograph and CTA of the chest performed 06/12/2017 FINDINGS: The lungs are well-aerated. Peribronchial thickening is noted. There is no evidence of focal opacification, pleural effusion or pneumothorax. The heart is borderline enlarged. No acute osseous abnormalities are seen. IMPRESSION: 1. Peribronchial thickening  noted.  Lungs otherwise grossly clear. 2. Borderline cardiomegaly. Electronically Signed  By: Roanna RaiderJeffery  Chang M.D.   On: 07/12/2017 03:10   Ct Abdomen Pelvis W Contrast  Result Date: 07/12/2017 CLINICAL DATA:  82 y/o F; chest pain and right upper quadrant abdominal pain. EXAM: CT ABDOMEN AND PELVIS WITH CONTRAST TECHNIQUE: Multidetector CT imaging of the abdomen and pelvis was performed using the standard protocol following bolus administration of intravenous contrast. CONTRAST:  100mL ISOVUE-300 IOPAMIDOL (ISOVUE-300) INJECTION 61% COMPARISON:  11/04/2016 lumbar spine MRI. FINDINGS: Lower chest: No acute abnormality. Hepatobiliary: No focal liver abnormality is seen. No gallstones, gallbladder wall thickening, or biliary dilatation. Pancreas: Unremarkable. No pancreatic ductal dilatation or surrounding inflammatory changes. Spleen: Normal in size without focal abnormality. Adrenals/Urinary Tract: Adrenal glands are unremarkable. Kidneys are normal, without renal calculi, focal lesion, or hydronephrosis. Bladder is unremarkable. Stomach/Bowel: Stomach is within normal limits. Appendix appears normal. No evidence of bowel wall thickening, distention, or inflammatory changes. Vascular/Lymphatic: Aortic atherosclerosis. No enlarged abdominal or pelvic lymph nodes. Reproductive: Status post hysterectomy. No adnexal masses. Other: No abdominal wall hernia or abnormality. No abdominopelvic ascites. Musculoskeletal: Mild S-shaped curvature of the lumbar spine and multilevel disc and facet degenerative changes. No acute fracture. Partially visualize right total hip prosthesis. Grade 1 L4-5 anterolisthesis. IMPRESSION: 1. No acute process identified. 2. Mild aortic atherosclerosis. 3. Moderate volume of stool in right hemi colon may represent constipation. 4. Stable lumbar spondylosis given differences in technique. Electronically Signed   By: Mitzi HansenLance  Furusawa-Stratton M.D.   On: 07/12/2017 03:40     Procedures Procedures (including critical care time)  Medications Ordered in ED Medications  morphine 4 MG/ML injection 2 mg (2 mg Intravenous Not Given 07/12/17 0348)  ondansetron (ZOFRAN) injection 4 mg (4 mg Intravenous Given 07/12/17 0347)  sodium chloride 0.9 % bolus 1,000 mL (1,000 mLs Intravenous New Bag/Given 07/12/17 0347)  alum & mag hydroxide-simeth (MAALOX/MYLANTA) 200-200-20 MG/5ML suspension 15 mL (15 mLs Oral Given 07/12/17 0347)  iopamidol (ISOVUE-300) 61 % injection (100 mLs  Contrast Given 07/12/17 0311)     Initial Impression / Assessment and Plan / ED Course  I have reviewed the triage vital signs and the nursing notes.  Pertinent labs & imaging results that were available during my care of the patient were reviewed by me and considered in my medical decision making (see chart for details).     82 yo F with a cc of abdominal pain.  Pain to epigastrium on exam.  Negative murphys.  No RUQ pain.  Will give maalox, labs, fluids.  Pain meds, ct and reassess.   The patient is feeling much better on reassessment.  CT scan of the abdomen pelvis is negative for acute process.  Labs are reassuring.  Discharge home.  5:34 AM:  I have discussed the diagnosis/risks/treatment options with the patient and believe the pt to be eligible for discharge home to follow-up with PCP. We also discussed returning to the ED immediately if new or worsening sx occur. We discussed the sx which are most concerning (e.g., sudden worsening pain, fever, inability to tolerate by mouth) that necessitate immediate return. Medications administered to the patient during their visit and any new prescriptions provided to the patient are listed below.  Medications given during this visit Medications  morphine 4 MG/ML injection 2 mg (2 mg Intravenous Not Given 07/12/17 0348)  ondansetron (ZOFRAN) injection 4 mg (4 mg Intravenous Given 07/12/17 0347)  sodium chloride 0.9 % bolus 1,000 mL (1,000 mLs  Intravenous New Bag/Given 07/12/17 0347)  alum & mag hydroxide-simeth (MAALOX/MYLANTA) 200-200-20 MG/5ML  suspension 15 mL (15 mLs Oral Given 07/12/17 0347)  iopamidol (ISOVUE-300) 61 % injection (100 mLs  Contrast Given 07/12/17 6962)     The patient appears reasonably screen and/or stabilized for discharge and I doubt any other medical condition or other [facility] requiring further screening, evaluation, or treatment in the ED at this time prior to discharge.    Final Clinical Impressions(s) / ED Diagnoses   Final diagnoses:  Epigastric pain    ED Discharge Orders    None       Melene Plan, DO 07/12/17 9528

## 2017-07-12 NOTE — Discharge Instructions (Signed)
Try zantac or pepcid.  He can buy both of these medicines over-the-counter.  Take either one twice a day for the next week.  Call your family doctor this morning and discussed that you came to the emergency department.  Then they can schedule you a follow-up with an appointment hopefully this week.

## 2017-07-12 NOTE — ED Notes (Signed)
Patient in EKG

## 2017-07-16 ENCOUNTER — Telehealth: Payer: Self-pay

## 2017-07-16 NOTE — Telephone Encounter (Signed)
Pt is aware and will call back to schedule a follow up appointment she is also aware that she can not continue being treated in this office without an appointment with the provider.

## 2017-07-16 NOTE — Telephone Encounter (Signed)
Patient had total hip replacement on 06/10/17 and was told by the doctor to discontinue the prolia

## 2017-07-16 NOTE — Telephone Encounter (Signed)
I would not agree with this. Also, she was lost for f/u for me after her first appt >2 years ago.Marland Kitchen..Marland Kitchen

## 2017-07-16 NOTE — Telephone Encounter (Signed)
Left vm requesting patient call back to schedule prolia injection

## 2017-07-20 NOTE — Progress Notes (Signed)
HPI The patient presents for followup up of CAD.  She had a PCI of LAD in 2012. She recently underwent total right hip arthroplasty. She presented to the hospital on 06/12/2017 with sudden onset of right-sided neck pain with radiation to the right jaw. CTA of the chest was negative for PE however troponin was elevated at 0.42. Echocardiogram was obtained on 06/13/2017 showed EF 60-65%, mild LVH, grade 1 DD, mild MR, mild TR, peak PA pressure 30 mmHg. Cardiac catheterization performed on 06/14/2017 showed 95% ostial D1 lesion, widely patent ostial LAD stent, 30% proximal LAD stenosis. Medical therapy was recommended. Imdur 60 mg daily was added to her medical regimen. Beta blocker was initially increased and later reduced due to symptomatic bradycardia.   Since going home she has light headedness and is not tolerated taking the AM beta blocker.  She started only taking this once daily.  She has been to the emergency room twice.  I reviewed these records.  Some calf tenderness and was found not to have a DVT in mid November.  Earlier this month she had chest pain that was thought to be GI.  Since then she has had none of these symptoms.  The patient denies any new symptoms such as chest discomfort, neck or arm discomfort. There has been no new shortness of breath, PND or orthopnea. There have been no reported palpitations, presyncope or syncope.   Allergies  Allergen Reactions  . Acyclovir And Related Other (See Comments)    unknown  . Pravastatin Sodium Other (See Comments)    cystitis  . Zocor [Simvastatin] Other (See Comments)    cystitis    Current Outpatient Medications  Medication Sig Dispense Refill  . aspirin 81 MG chewable tablet Chew 1 tablet (81 mg total) 2 (two) times daily by mouth. 60 tablet 1  . Biotin w/ Vitamins C & E (HAIR/SKIN/NAILS PO) Take 1 tablet by mouth daily.    . cycloSPORINE (RESTASIS) 0.05 % ophthalmic emulsion Place 1 drop 2 (two) times daily into both eyes.      Marland Kitchen. docusate sodium (COLACE) 100 MG capsule Take 1 capsule (100 mg total) 2 (two) times daily by mouth. 60 capsule 1  . HYDROcodone-acetaminophen (NORCO/VICODIN) 5-325 MG tablet Take 1-2 tablets every 6 (six) hours as needed by mouth for moderate pain. 60 tablet 0  . isosorbide mononitrate (IMDUR) 60 MG 24 hr tablet Take 1 tablet (60 mg total) daily by mouth. 30 tablet 11  . metoprolol tartrate (LOPRESSOR) 25 MG tablet Take 0.5 tablets (12.5 mg total) 2 (two) times daily by mouth. 60 tablet 11  . Multiple Vitamins-Calcium (ONE-A-DAY WOMENS PO) Take 1 tablet by mouth daily.     . naproxen sodium (ALEVE) 220 MG tablet Take 220 mg by mouth daily as needed (pain).    . nitroGLYCERIN (NITROSTAT) 0.4 MG SL tablet DISSOLVE 1 TABLET UNDER TONGUE IF NEEDED FOR CHEST PAIN. MAY REPEAT IN 5 MINUTES FOR 3 DOSES. 25 tablet 8  . rosuvastatin (CRESTOR) 10 MG tablet TAKE (1/2) TABLET DAILY. 30 tablet 4  . SYNTHROID 50 MCG tablet TAKE 1 TABLET EACH DAY. 90 tablet 1  . triamcinolone cream (KENALOG) 0.1 % Apply 1 application topically 2 (two) times daily. (Patient taking differently: Apply 1 application topically daily as needed (rash). ) 30 g 0  . ketotifen (ZADITOR) 0.025 % ophthalmic solution Place 1 drop 4 (four) times daily as needed into both eyes (allergies).     Marland Kitchen. LIDOCAINE EX Apply 1 patch  topically daily as needed (pain).    . ondansetron (ZOFRAN) 4 MG tablet Take 1 tablet (4 mg total) every 6 (six) hours as needed by mouth for nausea. (Patient not taking: Reported on 07/22/2017) 20 tablet 0  . senna (SENOKOT) 8.6 MG TABS tablet Take 2 tablets (17.2 mg total) at bedtime by mouth. (Patient not taking: Reported on 07/22/2017) 120 each 0   No current facility-administered medications for this visit.     Past Medical History:  Diagnosis Date  . Arthritis    DDD, scoliosis, sees Dr. Lovell SheehanJenkins for this, uses norco very rarely for pain  . CAD (coronary artery disease)    LAD stenting of a 90% lesion 2012  .  Cystocele   . Elevated cholesterol   . GERD (gastroesophageal reflux disease)    dx in work up 2016 for atypical CP at OSH   pt. denies  . Heart murmur   . Hypothyroidism   . Interstitial cystitis    sees Dr. Annabell HowellsWrenn  . Osteoporosis   . Rectocele   . Scoliosis   . Thyroid disease    Hypothyroid  . Urinary incontinence    USI  . Uterine prolapse     Past Surgical History:  Procedure Laterality Date  . BLADDER SUSPENSION  2011  . CORONARY ANGIOPLASTY WITH STENT PLACEMENT  04/21/2010   LAD 80%, RCA 30%, nl EF, s/p DES LAD  . LEFT HEART CATH AND CORONARY ANGIOGRAPHY N/A 06/14/2017   Procedure: LEFT HEART CATH AND CORONARY ANGIOGRAPHY;  Surgeon: Runell GessBerry, Jonathan J, MD;  Location: [facility];  Service: Cardiovascular;  Laterality: N/A;  . OOPHORECTOMY  2011   BSO  . TOTAL HIP ARTHROPLASTY Right 06/10/2017   Procedure: RIGHT TOTAL HIP ARTHROPLASTY ANTERIOR APPROACH;  Surgeon: Samson FredericSwinteck, Brian, MD;  Location: [facility];  Service: Orthopedics;  Laterality: Right;  Needs RNFA  . VAGINAL HYSTERECTOMY  2011   [facility] BSO    ROS:   As stated in the HPI and negative for all other systems.  PHYSICAL EXAM BP 120/70   Pulse 74   Ht 5\' 4"  (1.626 m)   Wt 127 lb (57.6 kg)   BMI 21.80 kg/m   GENERAL:  Well appearing NECK:  No jugular venous distention, waveform within normal limits, carotid upstroke brisk and symmetric, no bruits, no thyromegaly LUNGS:  Clear to auscultation bilaterally CHEST:  Unremarkable HEART:  PMI not displaced or sustained,S1 and S2 within normal limits, no S3, no S4, no clicks, no rubs, soft apical systolic murmur, no diastolic murmurs ABD:  Flat, positive bowel sounds normal in frequency in pitch, no bruits, no rebound, no guarding, no midline pulsatile mass, no hepatomegaly, no splenomegaly EXT:  2 plus pulses throughout, no edema, no cyanosis no clubbing   Lab Results  Component Value Date   CHOL 160 02/05/2017   TRIG 154 (H) 02/05/2017   HDL 52 02/05/2017    LDLCALC 77 02/05/2017   LDLDIRECT 140.6 05/25/2012     ASSESSMENT AND PLAN   CAD, NATIVE VESSEL -  She has no further symptoms.  She is not tolerating the twice daily beta-blocker.  Low heart rates in the hospital.  I will stop metoprolol altogether.  She will continue the other meds as listed.   HYPERTENSION -  The blood pressure is at target. No change in medications is indicated. We will continue with therapeutic lifestyle changes (TLC).  Hyperlipidemia - Her LDL was at target as above.  She will otherwise continue the meds  as listed.

## 2017-07-22 ENCOUNTER — Encounter: Payer: Self-pay | Admitting: Cardiology

## 2017-07-22 ENCOUNTER — Ambulatory Visit: Payer: Medicare Other | Admitting: Cardiology

## 2017-07-22 VITALS — BP 120/70 | HR 74 | Ht 64.0 in | Wt 127.0 lb

## 2017-07-22 DIAGNOSIS — I10 Essential (primary) hypertension: Secondary | ICD-10-CM | POA: Diagnosis not present

## 2017-07-22 DIAGNOSIS — I2510 Atherosclerotic heart disease of native coronary artery without angina pectoris: Secondary | ICD-10-CM | POA: Diagnosis not present

## 2017-07-22 DIAGNOSIS — E785 Hyperlipidemia, unspecified: Secondary | ICD-10-CM | POA: Diagnosis not present

## 2017-07-22 NOTE — Patient Instructions (Signed)
Medication Instructions:  STOP Metoprolol   If you need a refill on your cardiac medications before your next appointment, please call your pharmacy.  Labwork: None Ordered   Testing/Procedures: None Ordered  Special Instructions:  Happy Holiday!!  Follow-Up: Your physician wants you to follow-up in: 4 Months.   [facility] you [facility] choosing [facility]

## 2017-07-23 ENCOUNTER — Ambulatory Visit: Payer: Medicare Other | Admitting: Cardiology

## 2017-08-04 DIAGNOSIS — M25551 Pain in right hip: Secondary | ICD-10-CM | POA: Insufficient documentation

## 2017-08-12 ENCOUNTER — Encounter: Payer: Self-pay | Admitting: Family Medicine

## 2017-08-16 ENCOUNTER — Telehealth: Payer: Self-pay | Admitting: *Deleted

## 2017-08-16 NOTE — Telephone Encounter (Signed)
Dr Selena BattenKim received paperwork to complete for a home health certification and plan of care on the pt.  Per Dr Selena BattenKim I called [facility] and spoke with Tabitha and she stated she will inform the health team to correct provider.

## 2017-09-27 ENCOUNTER — Ambulatory Visit: Payer: Medicare Other | Admitting: Cardiology

## 2017-10-18 ENCOUNTER — Telehealth: Payer: Self-pay | Admitting: *Deleted

## 2017-10-18 NOTE — Telephone Encounter (Signed)
Dr Selena BattenKim received a fax from [facility] for a home health cert and plan of care to be completed.  I called [facility] at [phone] and left a detailed message for [place] BellsMelinda Hunter to contact the managing physician or cardiologist to complete the forms.

## 2017-11-09 NOTE — Progress Notes (Signed)
HPI The patient presents for followup up of CAD.  She had a PCI of LAD in 2012. She recently underwent total right hip arthroplasty. She presented to the hospital on 06/12/2017 with sudden onset of right-sided neck pain with radiation to the right jaw. CTA of the chest was negative for PE however troponin was elevated at 0.42. Echocardiogram was obtained on 06/13/2017 showed EF 60-65%, mild LVH, grade 1 DD, mild MR, mild TR, peak PA pressure 30 mmHg. Cardiac catheterization performed on 06/14/2017 showed 95% ostial D1 lesion, widely patent ostial LAD stent, 30% proximal LAD stenosis. Medical therapy was recommended. Imdur 60 mg daily was added to her medical regimen. Beta blocker was initially increased and later reduced due to symptomatic bradycardia. At the last visit I stopped the beta blocker.    Since I last saw her she is done well.  She played 9 holes of golf today.  Is not had any of the symptoms she had with her lower heart rate. The patient denies any new symptoms such as chest discomfort, neck or arm discomfort. There has been no new shortness of breath, PND or orthopnea. There have been no reported palpitations, presyncope or syncope.    Allergies  Allergen Reactions  . Acyclovir And Related Other (See Comments)    unknown  . Pravastatin Sodium Other (See Comments)    cystitis  . Zocor [Simvastatin] Other (See Comments)    cystitis    Current Outpatient Medications  Medication Sig Dispense Refill  . aspirin 81 MG chewable tablet Chew 1 tablet (81 mg total) 2 (two) times daily by mouth. 60 tablet 1  . Biotin w/ Vitamins C & E (HAIR/SKIN/NAILS PO) Take 1 tablet by mouth daily.    Marland Kitchen HYDROcodone-acetaminophen (NORCO/VICODIN) 5-325 MG tablet Take 1-2 tablets every 6 (six) hours as needed by mouth for moderate pain. 60 tablet 0  . isosorbide mononitrate (IMDUR) 60 MG 24 hr tablet Take 1 tablet (60 mg total) daily by mouth. 30 tablet 11  . ketotifen (ZADITOR) 0.025 % ophthalmic  solution Place 1 drop 4 (four) times daily as needed into both eyes (allergies).     . Multiple Vitamins-Calcium (ONE-A-DAY WOMENS PO) Take 1 tablet by mouth daily.     . naproxen sodium (ALEVE) 220 MG tablet Take 220 mg by mouth daily as needed (pain).    . nitroGLYCERIN (NITROSTAT) 0.4 MG SL tablet DISSOLVE 1 TABLET UNDER TONGUE IF NEEDED FOR CHEST PAIN. MAY REPEAT IN 5 MINUTES FOR 3 DOSES. 25 tablet 8  . rosuvastatin (CRESTOR) 10 MG tablet TAKE (1/2) TABLET DAILY. 30 tablet 4  . SYNTHROID 50 MCG tablet TAKE 1 TABLET EACH DAY. 90 tablet 1  . triamcinolone cream (KENALOG) 0.1 % Apply 1 application topically 2 (two) times daily. (Patient taking differently: Apply 1 application topically daily as needed (rash). ) 30 g 0   No current facility-administered medications for this visit.     Past Medical History:  Diagnosis Date  . Arthritis    DDD, scoliosis, sees Dr. Lovell Sheehan for this, uses norco very rarely for pain  . CAD (coronary artery disease)    LAD stenting of a 90% lesion 2012  . Cystocele   . Elevated cholesterol   . GERD (gastroesophageal reflux disease)    dx in work up 2016 for atypical CP at OSH   pt. denies  . Heart murmur   . Hypothyroidism   . Interstitial cystitis    sees Dr. [name]  .  Osteoporosis   . Rectocele   . Scoliosis   . Thyroid disease    Hypothyroid  . Urinary incontinence    USI  . Uterine prolapse     Past Surgical History:  Procedure Laterality Date  . BLADDER SUSPENSION  2011  . CORONARY ANGIOPLASTY WITH STENT PLACEMENT  04/21/2010   LAD 80%, RCA 30%, nl EF, s/p DES LAD  . LEFT HEART CATH AND CORONARY ANGIOGRAPHY N/A 06/14/2017   Procedure: LEFT HEART CATH AND CORONARY ANGIOGRAPHY;  Surgeon: Runell GessBerry, Jonathan J, MD;  Location: [facility];  Service: Cardiovascular;  Laterality: N/A;  . OOPHORECTOMY  2011   BSO  . TOTAL HIP ARTHROPLASTY Right 06/10/2017   Procedure: RIGHT TOTAL HIP ARTHROPLASTY ANTERIOR APPROACH;  Surgeon: Samson FredericSwinteck, Brian, MD;   Location: [facility];  Service: Orthopedics;  Laterality: Right;  Needs RNFA  . VAGINAL HYSTERECTOMY  2011   [facility] BSO    ROS:   As stated in the HPI and negative for all other systems.  PHYSICAL EXAM BP 130/68   Pulse 77   Ht 5\' 4"  (1.626 m)   Wt 132 lb (59.9 kg)   BMI 22.66 kg/m   GENERAL:  Well appearing NECK:  No jugular venous distention, waveform within normal limits, carotid upstroke brisk and symmetric, no bruits, no thyromegaly LUNGS:  Clear to auscultation bilaterally CHEST:  Unremarkable HEART:  PMI not displaced or sustained,S1 and S2 within normal limits, no S3, no S4, no clicks, no rubs, no murmurs ABD:  Flat, positive bowel sounds normal in frequency in pitch, no bruits, no rebound, no guarding, no midline pulsatile mass, no hepatomegaly, no splenomegaly EXT:  2 plus pulses throughout, no edema, no cyanosis no clubbing   EKG: NA  Lab Results  Component Value Date   CHOL 160 02/05/2017   TRIG 154 (H) 02/05/2017   HDL 52 02/05/2017   LDLCALC 77 02/05/2017   LDLDIRECT 140.6 05/25/2012     ASSESSMENT AND PLAN   CAD, NATIVE VESSEL -  The patient has no new sypmtoms.  No further cardiovascular testing is indicated.  We will continue with aggressive risk reduction and meds as listed.  She will remain off of the beta blocker.   HYPERTENSION -  The blood pressure is well controlled.  Continue current therapy.    Hyperlipidemia - LDL is at target.  No change in therapy.

## 2017-11-11 ENCOUNTER — Encounter: Payer: Self-pay | Admitting: Cardiology

## 2017-11-11 ENCOUNTER — Ambulatory Visit: Payer: Medicare Other | Admitting: Cardiology

## 2017-11-11 VITALS — BP 130/68 | HR 77 | Ht 64.0 in | Wt 132.0 lb

## 2017-11-11 DIAGNOSIS — I10 Essential (primary) hypertension: Secondary | ICD-10-CM

## 2017-11-11 DIAGNOSIS — R001 Bradycardia, unspecified: Secondary | ICD-10-CM | POA: Insufficient documentation

## 2017-11-11 DIAGNOSIS — I25119 Atherosclerotic heart disease of native coronary artery with unspecified angina pectoris: Secondary | ICD-10-CM

## 2017-11-11 DIAGNOSIS — E785 Hyperlipidemia, unspecified: Secondary | ICD-10-CM | POA: Diagnosis not present

## 2017-11-11 HISTORY — DX: Bradycardia, unspecified: R00.1

## 2017-11-11 NOTE — Patient Instructions (Signed)
Medication Instructions:  Continue current medications  If you need a refill on your cardiac medications before your next appointment, please call your pharmacy.  Labwork: None Ordered   Testing/Procedures: None Ordered  Follow-Up: Your physician wants you to follow-up in: 1 Year. You should receive a reminder letter in the mail two months in advance. If you do not receive a letter, please call our office [phone].    Thank you for choosing [facility] at [facility]!!      

## 2017-11-25 ENCOUNTER — Other Ambulatory Visit: Payer: Self-pay | Admitting: Family Medicine

## 2017-11-30 ENCOUNTER — Ambulatory Visit (INDEPENDENT_AMBULATORY_CARE_PROVIDER_SITE_OTHER): Payer: Medicare Other

## 2017-11-30 VITALS — BP 140/80 | HR 60 | Ht 64.0 in | Wt 129.2 lb

## 2017-11-30 DIAGNOSIS — Z0000 Encounter for general adult medical examination without abnormal findings: Secondary | ICD-10-CM | POA: Diagnosis not present

## 2017-11-30 NOTE — Patient Instructions (Addendum)
Kaitlyn Parks , Thank you for taking time to come for your Medicare Wellness Visit. I appreciate your ongoing commitment to your health goals. Please review the following plan we discussed and let me know if I can assist you in the future.    Calcium 1263m with Vit D 800u per day; more as directed by physician Strength building exercises discussed; can include walking; housework; small weights or stretch bands; silver sneakers if access to the [facility]  Please visit the osteoporosis foundation.org as they have a list of calcium rich foods   A Tetanus is recommended every 10 years. Medicare covers a tetanus if you have a cut or wound; otherwise, there may be a charge. If you had not had a tetanus with pertusses, known as the Tdap, you can take this anytime.   Shingrix is a vaccine for the prevention of Shingles in Adults 50 and older.  If you are on Medicare, the shingrix is covered under your Part D plan, so you will take both of the vaccines in the series at your pharmacy. Please check with your benefits regarding applicable copays or out of pocket expenses.  The Shingrix is given in 2 vaccines approx 8 weeks apart. You must receive the 2nd dose prior to 6 months from receipt of the first. Please have the pharmacist print out you Immunization  dates for our office records      These are the goals we discussed: Goals    . Exercise 150 min/wk Moderate Activity     To walk more and play golf more and continue in your home        This is a list of the screening recommended for you and due dates:  Health Maintenance  Topic Date Due  . Mammogram  08/17/2017  . Tetanus Vaccine  09/12/2017  . Flu Shot  03/03/2018  . DEXA scan (bone density measurement)  Completed  . Pneumonia vaccines  Completed      Fall Prevention in the Home Falls can cause injuries. They can happen to people of all ages. There are many things you can do to make your home safe and to help prevent falls. What can I do  on the outside of my home?  Regularly fix the edges of walkways and driveways and fix any cracks.  Remove anything that might make you trip as you walk through a door, such as a raised step or threshold.  Trim any bushes or trees on the path to your home.  Use bright outdoor lighting.  Clear any walking paths of anything that might make someone trip, such as rocks or tools.  Regularly check to see if handrails are loose or broken. Make sure that both sides of any steps have handrails.  Any raised decks and porches should have guardrails on the edges.  Have any leaves, snow, or ice cleared regularly.  Use sand or salt on walking paths during winter.  Clean up any spills in your garage right away. This includes oil or grease spills. What can I do in the bathroom?  Use night lights.  Install grab bars by the toilet and in the tub and shower. Do not use towel bars as grab bars.  Use non-skid mats or decals in the tub or shower.  If you need to sit down in the shower, use a plastic, non-slip stool.  Keep the floor dry. Clean up any water that spills on the floor as soon as it happens.  Remove soap buildup  in the tub or shower regularly.  Attach bath mats securely with double-sided non-slip rug tape.  Do not have throw rugs and other things on the floor that can make you trip. What can I do in the bedroom?  Use night lights.  Make sure that you have a light by your bed that is easy to reach.  Do not use any sheets or blankets that are too big for your bed. They should not hang down onto the floor.  Have a firm chair that has side arms. You can use this for support while you get dressed.  Do not have throw rugs and other things on the floor that can make you trip. What can I do in the kitchen?  Clean up any spills right away.  Avoid walking on wet floors.  Keep items that you use a lot in easy-to-reach places.  If you need to reach something above you, use a strong  step stool that has a grab bar.  Keep electrical cords out of the way.  Do not use floor polish or wax that makes floors slippery. If you must use wax, use non-skid floor wax.  Do not have throw rugs and other things on the floor that can make you trip. What can I do with my stairs?  Do not leave any items on the stairs.  Make sure that there are handrails on both sides of the stairs and use them. Fix handrails that are broken or loose. Make sure that handrails are as long as the stairways.  Check any carpeting to make sure that it is firmly attached to the stairs. Fix any carpet that is loose or worn.  Avoid having throw rugs at the top or bottom of the stairs. If you do have throw rugs, attach them to the floor with carpet tape.  Make sure that you have a light switch at the top of the stairs and the bottom of the stairs. If you do not have them, ask someone to add them for you. What else can I do to help prevent falls?  Wear shoes that: ? Do not have high heels. ? Have rubber bottoms. ? Are comfortable and fit you well. ? Are closed at the toe. Do not wear sandals.  If you use a stepladder: ? Make sure that it is fully opened. Do not climb a closed stepladder. ? Make sure that both sides of the stepladder are locked into place. ? Ask someone to hold it for you, if possible.  Clearly mark and make sure that you can see: ? Any grab bars or handrails. ? First and last steps. ? Where the edge of each step is.  Use tools that help you move around (mobility aids) if they are needed. These include: ? Canes. ? Walkers. ? Scooters. ? Crutches.  Turn on the lights when you go into a dark area. Replace any light bulbs as soon as they burn out.  Set up your furniture so you have a clear path. Avoid moving your furniture around.  If any of your floors are uneven, fix them.  If there are any pets around you, be aware of where they are.  Review your medicines with your doctor.  Some medicines can make you feel dizzy. This can increase your chance of falling. Ask your doctor what other things that you can do to help prevent falls. This information is not intended to replace advice given to you by your health care provider. Make  sure you discuss any questions you have with your health care provider. Document Released: 05/16/2009 Document Revised: 12/26/2015 Document Reviewed: 08/24/2014 Elsevier Interactive Patient Education  2018 [place] Maintenance, Female Adopting a healthy lifestyle and getting preventive care can go a long way to promote health and wellness. Talk with your health care provider about what schedule of regular examinations is right for you. This is a good chance for you to check in with your provider about disease prevention and staying healthy. In between checkups, there are plenty of things you can do on your own. Experts have done a lot of research about which lifestyle changes and preventive measures are most likely to keep you healthy. Ask your health care provider for more information. Weight and diet Eat a healthy diet  Be sure to include plenty of vegetables, fruits, low-fat dairy products, and lean protein.  Do not eat a lot of foods high in solid fats, added sugars, or salt.  Get regular exercise. This is one of the most important things you can do for your health. ? Most adults should exercise for at least 150 minutes each week. The exercise should increase your heart rate and make you sweat (moderate-intensity exercise). ? Most adults should also do strengthening exercises at least twice a week. This is in addition to the moderate-intensity exercise.  Maintain a healthy weight  Body mass index (BMI) is a measurement that can be used to identify possible weight problems. It estimates body fat based on height and weight. Your health care provider can help determine your BMI and help you achieve or maintain a healthy  weight.  For females 22 years of age and older: ? A BMI below 18.5 is considered underweight. ? A BMI of 18.5 to 24.9 is normal. ? A BMI of 25 to 29.9 is considered overweight. ? A BMI of 30 and above is considered obese.  Watch levels of cholesterol and blood lipids  You should start having your blood tested for lipids and cholesterol at 81 years of age, then have this test every 5 years.  You may need to have your cholesterol levels checked more often if: ? Your lipid or cholesterol levels are high. ? You are older than 81 years of age. ? You are at high risk for heart disease.  Cancer screening Lung Cancer  Lung cancer screening is recommended for adults 64-26 years old who are at high risk for lung cancer because of a history of smoking.  A yearly low-dose CT scan of the lungs is recommended for people who: ? Currently smoke. ? Have quit within the past 15 years. ? Have at least a 30-pack-year history of smoking. A pack year is smoking an average of one pack of cigarettes a day for 1 year.  Yearly screening should continue until it has been 15 years since you quit.  Yearly screening should stop if you develop a health problem that would prevent you from having lung cancer treatment.  Breast Cancer  Practice breast self-awareness. This means understanding how your breasts normally appear and feel.  It also means doing regular breast self-exams. Let your health care provider know about any changes, no matter how small.  If you are in your 20s or 30s, you should have a clinical breast exam (CBE) by a health care provider every 1-3 years as part of a regular health exam.  If you are 97 or older, have a CBE every year. Also consider having a breast  X-ray (mammogram) every year.  If you have a family history of breast cancer, talk to your health care provider about genetic screening.  If you are at high risk for breast cancer, talk to your health care provider about having an  MRI and a mammogram every year.  Breast cancer gene (BRCA) assessment is recommended for women who have family members with BRCA-related cancers. BRCA-related cancers include: ? Breast. ? Ovarian. ? Tubal. ? Peritoneal cancers.  Results of the assessment will determine the need for genetic counseling and BRCA1 and BRCA2 testing.  Cervical Cancer Your health care provider may recommend that you be screened regularly for cancer of the pelvic organs (ovaries, uterus, and vagina). This screening involves a pelvic examination, including checking for microscopic changes to the surface of your cervix (Pap test). You may be encouraged to have this screening done every 3 years, beginning at age 60.  For women ages 18-65, health care providers may recommend pelvic exams and Pap testing every 3 years, or they may recommend the Pap and pelvic exam, combined with testing for human papilloma virus (HPV), every 5 years. Some types of HPV increase your risk of cervical cancer. Testing for HPV may also be done on women of any age with unclear Pap test results.  Other health care providers may not recommend any screening for nonpregnant women who are considered low risk for pelvic cancer and who do not have symptoms. Ask your health care provider if a screening pelvic exam is right for you.  If you have had past treatment for cervical cancer or a condition that could lead to cancer, you need Pap tests and screening for cancer for at least 20 years after your treatment. If Pap tests have been discontinued, your risk factors (such as having a new sexual partner) need to be reassessed to determine if screening should resume. Some women have medical problems that increase the chance of getting cervical cancer. In these cases, your health care provider may recommend more frequent screening and Pap tests.  Colorectal Cancer  This type of cancer can be detected and often prevented.  Routine colorectal cancer screening  usually begins at 81 years of age and continues through 81 years of age.  Your health care provider may recommend screening at an earlier age if you have risk factors for colon cancer.  Your health care provider may also recommend using home test kits to check for hidden blood in the stool.  A small camera at the end of a tube can be used to examine your colon directly (sigmoidoscopy or colonoscopy). This is done to check for the earliest forms of colorectal cancer.  Routine screening usually begins at age 63.  Direct examination of the colon should be repeated every 5-10 years through 81 years of age. However, you may need to be screened more often if early forms of precancerous polyps or small growths are found.  Skin Cancer  Check your skin from head to toe regularly.  Tell your health care provider about any new moles or changes in moles, especially if there is a change in a mole's shape or color.  Also tell your health care provider if you have a mole that is larger than the size of a pencil eraser.  Always use sunscreen. Apply sunscreen liberally and repeatedly throughout the day.  Protect yourself by wearing long sleeves, pants, a wide-brimmed hat, and sunglasses whenever you are outside.  Heart disease, diabetes, and high blood pressure  High blood  pressure causes heart disease and increases the risk of stroke. High blood pressure is more likely to develop in: ? People who have blood pressure in the high end of the normal range (130-139/85-89 mm Hg). ? People who are overweight or obese. ? People who are African American.  If you are 53-43 years of age, have your blood pressure checked every 3-5 years. If you are 39 years of age or older, have your blood pressure checked every year. You should have your blood pressure measured twice-once when you are at a hospital or clinic, and once when you are not at a hospital or clinic. Record the average of the two measurements. To check  your blood pressure when you are not at a hospital or clinic, you can use: ? An automated blood pressure machine at a pharmacy. ? A home blood pressure monitor.  If you are between 37 years and 93 years old, ask your health care provider if you should take aspirin to prevent strokes.  Have regular diabetes screenings. This involves taking a blood sample to check your fasting blood sugar level. ? If you are at a normal weight and have a low risk for diabetes, have this test once every three years after 81 years of age. ? If you are overweight and have a high risk for diabetes, consider being tested at a younger age or more often. Preventing infection Hepatitis B  If you have a higher risk for hepatitis B, you should be screened for this virus. You are considered at high risk for hepatitis B if: ? You were born in a country where hepatitis B is common. Ask your health care provider which countries are considered high risk. ? Your parents were born in a high-risk country, and you have not been immunized against hepatitis B (hepatitis B vaccine). ? You have HIV or AIDS. ? You use needles to inject street drugs. ? You live with someone who has hepatitis B. ? You have had sex with someone who has hepatitis B. ? You get hemodialysis treatment. ? You take certain medicines for conditions, including cancer, organ transplantation, and autoimmune conditions.  Hepatitis C  Blood testing is recommended for: ? Everyone born from [birth year] through 1963. ? Anyone with known risk factors for hepatitis C.  Sexually transmitted infections (STIs)  You should be screened for sexually transmitted infections (STIs) including gonorrhea and chlamydia if: ? You are sexually active and are younger than 81 years of age. ? You are older than 81 years of age and your health care provider tells you that you are at risk for this type of infection. ? Your sexual activity has changed since you were last screened and you  are at an increased risk for chlamydia or gonorrhea. Ask your health care provider if you are at risk.  If you do not have HIV, but are at risk, it may be recommended that you take a prescription medicine daily to prevent HIV infection. This is called pre-exposure prophylaxis (PrEP). You are considered at risk if: ? You are sexually active and do not regularly use condoms or know the HIV status of your partner(s). ? You take drugs by injection. ? You are sexually active with a partner who has HIV.  Talk with your health care provider about whether you are at high risk of being infected with HIV. If you choose to begin PrEP, you should first be tested for HIV. You should then be tested every 3 months for as  long as you are taking PrEP. Pregnancy  If you are premenopausal and you may become pregnant, ask your health care provider about preconception counseling.  If you may become pregnant, take 400 to 800 micrograms (mcg) of folic acid every day.  If you want to prevent pregnancy, talk to your health care provider about birth control (contraception). Osteoporosis and menopause  Osteoporosis is a disease in which the bones lose minerals and strength with aging. This can result in serious bone fractures. Your risk for osteoporosis can be identified using a bone density scan.  If you are 34 years of age or older, or if you are at risk for osteoporosis and fractures, ask your health care provider if you should be screened.  Ask your health care provider whether you should take a calcium or vitamin D supplement to lower your risk for osteoporosis.  Menopause may have certain physical symptoms and risks.  Hormone replacement therapy may reduce some of these symptoms and risks. Talk to your health care provider about whether hormone replacement therapy is right for you. Follow these instructions at home:  Schedule regular health, dental, and eye exams.  Stay current with your  immunizations.  Do not use any tobacco products including cigarettes, chewing tobacco, or electronic cigarettes.  If you are pregnant, do not drink alcohol.  If you are breastfeeding, limit how much and how often you drink alcohol.  Limit alcohol intake to no more than 1 drink per day for nonpregnant women. One drink equals 12 ounces of beer, 5 ounces of wine, or 1 ounces of hard liquor.  Do not use street drugs.  Do not share needles.  Ask your health care provider for help if you need support or information about quitting drugs.  Tell your health care provider if you often feel depressed.  Tell your health care provider if you have ever been abused or do not feel safe at home. This information is not intended to replace advice given to you by your health care provider. Make sure you discuss any questions you have with your health care provider. Document Released: 02/02/2011 Document Revised: 12/26/2015 Document Reviewed: 04/23/2015 Elsevier Interactive Patient Education  [employer].

## 2017-11-30 NOTE — Progress Notes (Signed)
Kaitlyn Faiella R [name], DO  

## 2017-11-30 NOTE — Progress Notes (Signed)
Subjective:   Kaitlyn Parks is a 83 y.o. female who presents for Medicare Annual (Subsequent) preventive examination.  Reports health as OV 11/26/2016  Hip replacement Nov 8th; Dr. Linna Caprice; doing well Was discharged in 24 hours  Home less than 24 hours and went back with MI    Diet Lipids cho/hdl 3.1 Labs 04/30/2017 Dr. [place] Lions 11/2017 CAD w medical therapy Stated she need labs  Both children are in to fitness Eats well  Christmas, no cookies or deserts  Has good support system here    Dtr is PT and son in law is lawyer Had child with special needs  Has one other dtr; 81 yo ( dtr is a IT trainer)  2 grandsons in college   Exercise Playing golf some At 82 hard to hold out at [employer]  She is "stretching" now and some yard work   Going to sessions for retirement homes. Will eventually move at some point if she needs too. Spouse had good retirement benefit  AL called [employer]     Health Maintenance Due  Topic Date Due  . TETANUS/TDAP  09/12/2017   Cologuard 08/31/2016  Due 08/2019 Mammogram 08/2016; feels he had one this year Was suppose to send this to Dr. Selena Batten  dexa 12/2015 -2.9   Does not have the bone density any more Was going to Dr. Elvera Lennox but ortho discontinued prolia  Calcium via one a day      Did have Vascular ultrasound 06/2017     Shingrix education discussed  Cardiac Risk Factors include: advanced age (>22men, >6 women);family history of premature cardiovascular disease;hypertension     Objective:     Vitals: BP 140/80   Pulse 60   Ht  (1.626 m)   Wt 129 lb 3 oz (58.6 kg)   SpO2 97%   BMI 22.17 kg/m   Body mass index is 22.17 kg/m.  Advanced Directives 11/30/2017 06/16/2017 06/12/2017 06/12/2017 06/10/2017 06/04/2017 08/11/2016  Does Patient Have a Medical Advance Directive? Yes Yes Yes Yes Yes Yes Yes  Type of Advance Directive - Customer service manager Power of [employer] Power of  [employer] Power of [place];Living will -  Does patient want to make changes to medical advance directive? - - No - Patient declined - No - Patient declined - -  Copy of Healthcare Power of Attorney in Chart? - - No - copy requested - No - copy requested No - copy requested -    Tobacco Social History   Tobacco Use  Smoking Status Never Smoker  Smokeless Tobacco Never Used     Counseling given: Yes   Clinical Intake:     Past Medical History:  Diagnosis Date  . Arthritis    DDD, scoliosis, sees Dr. Lovell Sheehan for this, uses norco very rarely for pain  . CAD (coronary artery disease)    LAD stenting of a 90% lesion 2012  . Cystocele   . Elevated cholesterol   . GERD (gastroesophageal reflux disease)    dx in work up 2016 for atypical CP at OSH   pt. denies  . Heart murmur   . Hypothyroidism   . Interstitial cystitis    sees Dr. [name]  . Osteoporosis   . Rectocele   . Scoliosis   . Thyroid disease    Hypothyroid  . Urinary incontinence    USI  . Uterine prolapse    Past Surgical History:  Procedure Laterality Date  . BLADDER  SUSPENSION  2011  . CORONARY ANGIOPLASTY WITH STENT PLACEMENT  04/21/2010   LAD 80%, RCA 30%, nl EF, s/p DES LAD  . LEFT HEART CATH AND CORONARY ANGIOGRAPHY N/A 06/14/2017   Procedure: LEFT HEART CATH AND CORONARY ANGIOGRAPHY;  Surgeon: Runell Gess, MD;  Location: [facility];  Service: Cardiovascular;  Laterality: N/A;  . OOPHORECTOMY  2011   BSO  . TOTAL HIP ARTHROPLASTY Right 06/10/2017   Procedure: RIGHT TOTAL HIP ARTHROPLASTY ANTERIOR APPROACH;  Surgeon: Samson Frederic, MD;  Location: [facility];  Service: Orthopedics;  Laterality: Right;  Needs RNFA  . VAGINAL HYSTERECTOMY  2011   [facility] BSO   Family History  Problem Relation Age of Onset  . Hypertension Mother   . Heart disease Mother   . Heart disease Father   . Heart disease Sister   . Colon cancer Paternal Aunt 63  . Breast cancer Paternal Aunt        Age 57's    . Diabetes Sister   . Endometrial cancer Sister   . Breast cancer Cousin        Maternal 1st cousins-Age 61's  . Leukemia Paternal Aunt    Social History   Socioeconomic History  . Marital status: Widowed    Spouse name: Not on file  . Number of children: Not on file  . Years of education: Not on file  . Highest education level: Not on file  Occupational History  . Not on file  Social Needs  . Financial resource strain: Not on file  . Food insecurity:    Worry: Not on file    Inability: Not on file  . Transportation needs:    Medical: Not on file    Non-medical: Not on file  Tobacco Use  . Smoking status: Never Smoker  . Smokeless tobacco: Never Used  Substance and Sexual Activity  . Alcohol use: No    Alcohol/week: 0.0 oz    Comment: Rare  . Drug use: No  . Sexual activity: Never    Birth control/protection: Surgical  Lifestyle  . Physical activity:    Days per week: Not on file    Minutes per session: Not on file  . Stress: Not on file  Relationships  . Social connections:    Talks on phone: Not on file    Gets together: Not on file    Attends religious service: Not on file    Active member of club or organization: Not on file    Attends meetings of clubs or organizations: Not on file    Relationship status: Not on file  Other Topics Concern  . Not on file  Social History Narrative   Work or School:  none      Home Situation: lives with husband      Spiritual Beliefs: [employer]      Lifestyle: regular exercise (yoga, water aerobics); diet is healthy       Outpatient Encounter Medications as of 11/30/2017  Medication Sig  . aspirin 81 MG chewable tablet Chew 1 tablet (81 mg total) 2 (two) times daily by mouth.  . Biotin w/ Vitamins C & E (HAIR/SKIN/NAILS PO) Take 1 tablet by mouth daily.  Marland Kitchen HYDROcodone-acetaminophen (NORCO/VICODIN) 5-325 MG tablet Take 1-2 tablets every 6 (six) hours as needed by mouth for moderate pain.  . isosorbide mononitrate  (IMDUR) 60 MG 24 hr tablet Take 1 tablet (60 mg total) daily by mouth.  Marland Kitchen ketotifen (ZADITOR) 0.025 % ophthalmic solution Place  1 drop 4 (four) times daily as needed into both eyes (allergies).   . Multiple Vitamins-Calcium (ONE-A-DAY WOMENS PO) Take 1 tablet by mouth daily.   . naproxen sodium (ALEVE) 220 MG tablet Take 220 mg by mouth daily as needed (pain).  . nitroGLYCERIN (NITROSTAT) 0.4 MG SL tablet DISSOLVE 1 TABLET UNDER TONGUE IF NEEDED FOR CHEST PAIN. MAY REPEAT IN 5 MINUTES FOR 3 DOSES.  Marland Kitchen rosuvastatin (CRESTOR) 10 MG tablet TAKE (1/2) TABLET DAILY.  . SYNTHROID 50 MCG tablet TAKE 1 TABLET EACH DAY.  Marland Kitchen triamcinolone cream (KENALOG) 0.1 % Apply 1 application topically 2 (two) times daily. (Patient taking differently: Apply 1 application topically daily as needed (rash). )   No facility-administered encounter medications on file as of 11/30/2017.     Activities of Daily Living In your present state of health, do you have any difficulty performing the following activities: 11/30/2017 11/30/2017  Hearing? - N  Vision? - N  Comment - has MD   Difficulty concentrating or making decisions? - N  Comment - short term memory; has not interferred with her ability to live independently   Walking or climbing stairs? - N  Dressing or bathing? - N  Doing errands, shopping? - N  Quarry manager and eating ? - N  Using the Toilet? - N  In the past six months, have you accidently leaked urine? (No Data) N  Comment scant when hitting down on the golf ball  -  Do you have problems with loss of bowel control? - N  Managing your Medications? - N  Managing your Finances? - N  Housekeeping or managing your Housekeeping? - N  Some recent data might be hidden    Patient Care Team: Terressa Koyanagi, DO as PCP - General (Family Medicine) Rollene Rotunda, MD as Consulting Physician (Cardiology) Carlus Pavlov, MD as Consulting Physician (Internal Medicine) Tressie Stalker, MD as Consulting Physician  (Neurosurgery) Bjorn Pippin, MD as Attending Physician (Urology)    Assessment:   This is a routine wellness examination for Carlei.  Exercise Activities and Dietary recommendations Current Exercise Habits: Home exercise routine, Intensity: Mild, Exercise limited by: Other - see comments(strectching every day)  Goals    . Exercise 150 min/wk Moderate Activity     To walk more and play golf more and continue in your home        Fall Risk Fall Risk  11/30/2017 08/11/2016 11/17/2013  Falls in the past year? No No No    Depression Screen PHQ 2/9 Scores 11/30/2017 08/11/2016 11/17/2013  PHQ - 2 Score 0 0 0    Spouse died in 03/18/23 and sister died in 01-16-2023 Both died last year   Cognitive Function MMSE - Mini Mental State Exam 11/30/2017 08/11/2016  Not completed: (No Data) (No Data)     Ad8 score reviewed for issues:  Issues making decisions:  Less interest in hobbies / activities:  Repeats questions, stories (family complaining):  Trouble using ordinary gadgets (microwave, computer, phone):  Forgets the month or year:   Mismanaging finances:   Remembering appts:  Daily problems with thinking and/or memory: Ad8 score is=0 No memory issues  Plays bridge and cross word puzzles  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 Influenza Whole 08/11/2007  . Influenza, High Dose Seasonal PF 08/06/2014, 04/30/2017  . Influenza,inj,Quad PF,6+ Mos 04/24/2013  . Influenza-Unspecified 06/03/2016  . Pneumococcal Conjugate-13 02/19/2014  . Pneumococcal Polysaccharide-23 09/13/2007  .  Td 11/04/1995, 09/13/2007     Screening Tests Health Maintenance  Topic Date Due  . TETANUS/TDAP  09/12/2017  . INFLUENZA VACCINE  03/03/2018  . MAMMOGRAM  09/03/2018  . DEXA SCAN  Completed  . PNA vac Low Risk Adult  Completed        Plan:      PCP Notes   Health Maintenance Needs tdap will take at the pharmacy Will take shingrix  at the pharmacy Will call and have her mammogram sent to Dr. Selena Batten  Declines further dexa as she can't take prolia anymore' Will be sure she gets her Calcium and Vit D in as well as she has a plan to start walking and playing more golf this summer  Abnormal Screens  HR was 49 but states it is generally 60 range. Rechecked at 60  Was just in to see Dr. [place] Lions and doing well   Referrals  none  Patient concerns; States DR. Hochrien requested she get her labs here. TSH was drawn in Sept and other lipids drawn in July. (c/o of hair loss  Which was not notable )  Plans to wait until July to recheck labs and make an apt with Dr. Selena Batten for fup   Please advise otherwise.   Nurse Concerns; As noted  Next PCP apt TBS around July    I have personally reviewed and noted the following in the patient's chart:   . Medical and social history . Use of alcohol, tobacco or illicit drugs  . Current medications and supplements . Functional ability and status . Nutritional status . Physical activity . Advanced directives . List of other physicians . Hospitalizations, surgeries, and ER visits in previous 12 months . Vitals . Screenings to include cognitive, depression, and falls . Referrals and appointments  In addition, I have reviewed and discussed with patient certain preventive protocols, quality metrics, and best practice recommendations. A written personalized care plan for preventive services as well as general preventive health recommendations were provided to patient.     Montine Circle, RN  11/30/2017

## 2017-12-09 ENCOUNTER — Emergency Department (HOSPITAL_COMMUNITY): Payer: Medicare Other

## 2017-12-09 ENCOUNTER — Emergency Department (HOSPITAL_COMMUNITY)
Admission: EM | Admit: 2017-12-09 | Discharge: 2017-12-09 | Disposition: A | Discharge status: Home / Self Care | Payer: Medicare Other | Attending: Emergency Medicine | Admitting: Emergency Medicine

## 2017-12-09 ENCOUNTER — Telehealth: Payer: Self-pay | Admitting: Cardiology

## 2017-12-09 ENCOUNTER — Other Ambulatory Visit: Payer: Self-pay

## 2017-12-09 ENCOUNTER — Encounter (HOSPITAL_COMMUNITY): Payer: Self-pay

## 2017-12-09 DIAGNOSIS — I2510 Atherosclerotic heart disease of native coronary artery without angina pectoris: Secondary | ICD-10-CM | POA: Insufficient documentation

## 2017-12-09 DIAGNOSIS — R002 Palpitations: Secondary | ICD-10-CM | POA: Diagnosis not present

## 2017-12-09 DIAGNOSIS — E039 Hypothyroidism, unspecified: Secondary | ICD-10-CM | POA: Insufficient documentation

## 2017-12-09 DIAGNOSIS — Z79899 Other long term (current) drug therapy: Secondary | ICD-10-CM | POA: Diagnosis not present

## 2017-12-09 DIAGNOSIS — Z7982 Long term (current) use of aspirin: Secondary | ICD-10-CM | POA: Insufficient documentation

## 2017-12-09 DIAGNOSIS — I252 Old myocardial infarction: Secondary | ICD-10-CM | POA: Diagnosis not present

## 2017-12-09 DIAGNOSIS — Z955 Presence of coronary angioplasty implant and graft: Secondary | ICD-10-CM | POA: Diagnosis not present

## 2017-12-09 DIAGNOSIS — Z96641 Presence of right artificial hip joint: Secondary | ICD-10-CM | POA: Insufficient documentation

## 2017-12-09 DIAGNOSIS — I10 Essential (primary) hypertension: Secondary | ICD-10-CM | POA: Diagnosis not present

## 2017-12-09 LAB — BASIC METABOLIC PANEL
Anion gap: 8 (ref 5–15)
BUN: 13 mg/dL (ref 6–20)
CHLORIDE: 108 mmol/L (ref 101–111)
CO2: 27 mmol/L (ref 22–32)
CREATININE: 0.68 mg/dL (ref 0.44–1.00)
Calcium: 8.8 mg/dL — ABNORMAL LOW (ref 8.9–10.3)
GFR calc non Af Amer: >60 mL/min (ref >60)
Glucose, Bld: 89 mg/dL (ref 65–99)
Potassium: 4 mmol/L (ref 3.5–5.1)
SODIUM: 143 mmol/L (ref 135–145)

## 2017-12-09 LAB — I-STAT TROPONIN, ED: Troponin i, poc: 0 ng/mL (ref 0.00–0.08)

## 2017-12-09 LAB — CBC WITH DIFFERENTIAL/PLATELET
BASOS PCT: 0 %
Basophils Absolute: 0 10*3/uL (ref 0.0–0.1)
EOS ABS: 0 10*3/uL (ref 0.0–0.7)
EOS PCT: 1 %
HCT: 39.4 % (ref 36.0–46.0)
Hemoglobin: 12.8 g/dL (ref 12.0–15.0)
LYMPHS ABS: 0.9 10*3/uL (ref 0.7–4.0)
Lymphocytes Relative: 24 %
MCH: 29.4 pg (ref 26.0–34.0)
MCHC: 32.5 g/dL (ref 30.0–36.0)
MCV: 90.4 fL (ref 78.0–100.0)
MONOS PCT: 8 %
Monocytes Absolute: 0.3 10*3/uL (ref 0.1–1.0)
NEUTROS ABS: 2.5 10*3/uL (ref 1.7–7.7)
NEUTROS PCT: 67 %
Platelets: 209 10*3/uL (ref 150–400)
RBC: 4.36 MIL/uL (ref 3.87–5.11)
RDW: 13.1 % (ref 11.5–15.5)
WBC: 3.6 10*3/uL — ABNORMAL LOW (ref 4.0–10.5)

## 2017-12-09 LAB — URINALYSIS, ROUTINE W REFLEX MICROSCOPIC
Bilirubin Urine: NEGATIVE
Glucose, UA: NEGATIVE mg/dL
HGB URINE DIPSTICK: NEGATIVE
KETONES UR: NEGATIVE mg/dL
LEUKOCYTES UA: NEGATIVE
Nitrite: NEGATIVE
PROTEIN: NEGATIVE mg/dL
Specific Gravity, Urine: 1.005 (ref 1.005–1.030)
pH: 8 (ref 5.0–8.0)

## 2017-12-09 LAB — BRAIN NATRIURETIC PEPTIDE: B NATRIURETIC PEPTIDE 5: 127.6 pg/mL — AB (ref 0.0–100.0)

## 2017-12-09 LAB — D-DIMER, QUANTITATIVE (NOT AT ARMC): D DIMER QUANT: 0.72 ug{FEU}/mL — AB (ref 0.00–0.50)

## 2017-12-09 MED ORDER — IOPAMIDOL (ISOVUE-370) INJECTION 76%
100.0000 mL | Freq: Once | INTRAVENOUS | Status: AC | PRN
  Administered 2017-12-09: 100 mL via INTRAVENOUS

## 2017-12-09 MED ORDER — SODIUM CHLORIDE 0.9 % IV SOLN
INTRAVENOUS | Status: DC
  Administered 2017-12-09: 15:00:00 via INTRAVENOUS

## 2017-12-09 MED ORDER — IOPAMIDOL (ISOVUE-370) INJECTION 76%: 100.0000 mL | Freq: Once | INTRAVENOUS | Status: DC | PRN

## 2017-12-09 MED ORDER — IOPAMIDOL (ISOVUE-370) INJECTION 76%
INTRAVENOUS | Status: AC
  Filled 2017-12-09: qty 100

## 2017-12-09 NOTE — ED Notes (Signed)
Pt discharged from ED; instructions provided; Pt encouraged to return to ED if symptoms worsen and to f/u with PCP; Pt verbalized understanding of all instructions 

## 2017-12-09 NOTE — Telephone Encounter (Signed)
Spoke with pt who reports her BP is 196/95 and he she is experiencing tingling sensation down her left arm. She also reports she has a tight feeling in the back of her head/back. Pt states right facial side slightly droops more than the left. Pt advised to immediately contact EMS. Pt verbalized understanding. Cardmaster contacted.

## 2017-12-09 NOTE — ED Notes (Signed)
Patient reports that she has taken 8  of Aspirin. "2-325 of aspirin at 10pm, 2-325mg  of aspirin at 2am, 2-325mg  of aspirin at 8am and the last 2 at 9:30am"

## 2017-12-09 NOTE — Telephone Encounter (Signed)
Follow up    Pt c/o BP issue:  1. What are your last 5 BP readings? 186/95 2. Are you having any other symptoms (ex. Dizziness, headache, blurred vision, passed out)? Patient called back to advise that she just have some left arm sensation 3. What is your medication issue? None

## 2017-12-09 NOTE — ED Notes (Signed)
Patient denies pain.

## 2017-12-09 NOTE — ED Triage Notes (Addendum)
[facility]- pt coming from home with complaint of HTN and tingling in her left arm that began around 0700. Pt took her BP at home and it was elevated, bp 200/90 with EMS. Pt has cardiac hx but no hx of HTN. Pt alert and oriented with EMS. 20g IV left hand. Pt has taken (8) 324 aspirin since last night.

## 2017-12-09 NOTE — ED Notes (Signed)
Got patient moved into room on the monitor did ekg shown to Dr Mellody Life

## 2017-12-09 NOTE — ED Notes (Signed)
CT notified that patient has a  20-Guage-IV and  ready for CTA

## 2017-12-09 NOTE — Telephone Encounter (Signed)
Pt c/o BP issue: STAT if pt c/o blurred vision, one-sided weakness or slurred speech  1. What are your last 5 BP readings? 184/84 2. Are you having any other symptoms (ex. Dizziness, headache, blurred vision, passed out)? no  3. What is your BP issue? High bp

## 2017-12-09 NOTE — ED Notes (Addendum)
Reports that his started last night, she felt light-headed and took her BP and noticed that it was 184 "top number", she continued to take her BP and it was still "very high" she ate some more and hoped she felt better, this morning she called her cardiologist office who asked her to call EMS and go to the ED immediately. Reports history of "stent and a heart attack".

## 2017-12-09 NOTE — ED Provider Notes (Signed)
[facility] EMERGENCY DEPARTMENT Provider Note   CSN: [id number] Arrival date & time: 12/09/17  1052     History   Chief Complaint Chief Complaint  Patient presents with  . Hypertension    HPI Kaitlyn Parks is a 83 y.o. female.  Patient with a sudden and acute onset of hypertension starting last evening.  Associated with some lightheadedness and feeling as if she was having palpitations.  Blood pressure at one point was measured at 200/90.  Had several in the 180 range systolic.  No known history of hypertension.  Patient has a history of coronary artery disease had stents placed in 2012 had a recent cardiac cath in November 2018 without significant findings.  Patient is followed by Dr. Antoine Poche.  No syncope.  The patient just did not feel well.  No shortness of breath.  No chest pain.  Patient contacted her cardiology office and they could not get her and they recommended she get evaluated at the emergency department.  Patient stated her blood pressure shot up one time before in the past when she had a urinary tract infection but she has no dysuria or urinary frequency or any urinary tract symptoms.     Past Medical History:  Diagnosis Date  . Arthritis    DDD, scoliosis, sees Dr. Lovell Sheehan for this, uses norco very rarely for pain  . CAD (coronary artery disease)    LAD stenting of a 90% lesion 2012  . Cystocele   . Elevated cholesterol   . GERD (gastroesophageal reflux disease)    dx in work up 2016 for atypical CP at OSH   pt. denies  . Heart murmur   . Hypothyroidism   . Interstitial cystitis    sees Dr. [name]  . Osteoporosis   . Rectocele   . Scoliosis   . Thyroid disease    Hypothyroid  . Urinary incontinence    USI  . Uterine prolapse     Patient Active Problem List   Diagnosis Date Noted  . Bradycardia 11/11/2017  . Dyslipidemia 11/11/2017  . NSTEMI (non-ST elevated myocardial infarction) (HCC)   . Unstable angina (HCC) 06/12/2017  . CAD  (coronary artery disease) 06/12/2017  . GERD (gastroesophageal reflux disease) 06/12/2017  . HLD (hyperlipidemia) 06/12/2017  . 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 Hypothyroidism, adult 06/12/2017  . Osteoarthritis of right hip 06/10/2017  . MIXED HYPERLIPIDEMIA 05/12/2010  . CAD, NATIVE VESSEL 05/09/2010  . BACK PAIN, LUMBOSACRAL, CHRONIC 07/23/2009  . Hypothyroidism 06/08/2007  . MITRAL VALVE PROLAPSE 06/08/2007    Past Surgical History:  Procedure Laterality Date  . BLADDER SUSPENSION  2011  . CORONARY ANGIOPLASTY WITH STENT PLACEMENT  04/21/2010   LAD 80%, RCA 30%, nl EF, s/p DES LAD  . LEFT HEART CATH AND CORONARY ANGIOGRAPHY N/A 06/14/2017   Procedure: LEFT HEART CATH AND CORONARY ANGIOGRAPHY;  Surgeon: Runell Gess, MD;  Location: [facility] INVASIVE [facility] LAB;  Service: Cardiovascular;  Laterality: N/A;  . OOPHORECTOMY  2011   BSO  . TOTAL HIP ARTHROPLASTY Right 06/10/2017   Procedure: RIGHT TOTAL HIP ARTHROPLASTY ANTERIOR APPROACH;  Surgeon: Samson Frederic, MD;  Location: [facility] ORS;  Service: Orthopedics;  Laterality: Right;  Needs RNFA  . VAGINAL HYSTERECTOMY  2011   [facility] BSO     OB History    Gravida  2   Para  2   Term  2   Preterm      AB      Living  2     SAB      TAB      Ectopic      Multiple      Live Births               Home Medications    Prior to Admission medications   Medication Sig Start Date End Date Taking? Authorizing Provider  aspirin 81 MG chewable tablet Chew 1 tablet (81 mg total) 2 (two) times daily by mouth. 06/11/17  Yes Swinteck, Arlys John, MD  Biotin w/ Vitamins C & E (HAIR/SKIN/NAILS PO) Take 1 tablet by mouth daily.   Yes [provider]  isosorbide mononitrate (IMDUR) 60 MG 24 hr tablet Take 1 tablet (60 mg total) daily by mouth. 06/15/17  Yes Barrett, Joline Salt, PA-C  ketotifen (ZADITOR) 0.025 % ophthalmic solution Place 1 drop 4 (four) times daily as needed into both eyes (allergies).    Yes [provider]  Multiple Vitamins-Calcium (ONE-A-DAY WOMENS PO) Take 1 tablet by mouth daily.    Yes [provider]  naproxen sodium (ALEVE) 220 MG tablet Take 220 mg by mouth daily as needed (pain).   Yes [provider]  nitroGLYCERIN (NITROSTAT) 0.4 MG SL tablet DISSOLVE 1 TABLET UNDER TONGUE IF NEEDED FOR CHEST PAIN. MAY REPEAT IN 5 MINUTES FOR 3 DOSES. 02/10/16  Yes Rollene Rotunda, MD  rosuvastatin (CRESTOR) 10 MG tablet TAKE (1/2) TABLET DAILY. 03/24/17  Yes Rollene Rotunda, MD  SYNTHROID 50 MCG tablet TAKE 1 TABLET EACH DAY. 11/26/17  Yes Terressa Koyanagi, DO  HYDROcodone-acetaminophen (NORCO/VICODIN) 5-325 MG tablet Take 1-2 tablets every 6 (six) hours as needed by mouth for moderate pain. Patient not taking: Reported on 12/09/2017 06/11/17   Samson Frederic, MD  triamcinolone cream (KENALOG) 0.1 % Apply 1 application topically 2 (two) times daily. Patient not taking: Reported on 12/09/2017 04/10/14   Terressa Koyanagi, DO    Family History Family History  Problem Relation Age of Onset  . Hypertension Mother   . Heart disease Mother   . Heart disease Father   . Heart disease Sister   . Colon cancer Paternal Aunt 24  . Breast cancer Paternal Aunt        Age 42's  . Diabetes Sister   . Endometrial cancer Sister   . Breast cancer Cousin        Maternal 1st cousins-Age 16's  . Leukemia Paternal Aunt     Social History Social History   Tobacco Use  . Smoking status: Never Smoker  . Smokeless tobacco: Never Used  Substance Use Topics  . Alcohol use: No    Alcohol/week: 0.0 oz    Comment: Rare  . Drug use: No     Allergies   Acyclovir and related; Pravastatin sodium; and Zocor [simvastatin]   Review of Systems Review of Systems  Constitutional: Positive for fatigue. Negative for fever.  HENT: Negative for congestion.   Eyes: Negative for visual disturbance.  Respiratory: Negative for shortness of breath.   Cardiovascular: Positive for palpitations. Negative  for chest pain and leg swelling.  Gastrointestinal: Negative for abdominal pain.  Genitourinary: Negative for dysuria and frequency.  Musculoskeletal: Negative for back pain.  Neurological: Positive for light-headedness. Negative for dizziness, syncope, speech difficulty, weakness and numbness.  Hematological: Does not bruise/bleed easily.  Psychiatric/Behavioral: Negative for confusion.     Physical Exam Updated Vital Signs BP (!) 169/82   Pulse 61   Temp 98.2 F (36.8 C) (Oral)  Resp (!) 22   SpO2 97%   Physical Exam  Constitutional: She is oriented to person, place, and time. She appears well-developed and well-nourished. No distress.  HENT:  Head: Normocephalic and atraumatic.  Mouth/Throat: Oropharynx is clear and moist.  Eyes: Pupils are equal, round, and reactive to light. Conjunctivae and EOM are normal.  Neck: Neck supple.  Cardiovascular: Normal rate, regular rhythm and normal heart sounds.  Pulmonary/Chest: Effort normal and breath sounds normal.  Abdominal: Soft. Bowel sounds are normal. There is no tenderness.  Musculoskeletal: Normal range of motion. She exhibits no edema.  Neurological: She is alert and oriented to person, place, and time. No cranial nerve deficit or sensory deficit. She exhibits normal muscle tone. Coordination normal.  Skin: Skin is warm.  Nursing note and vitals reviewed.    ED Treatments / Results  Labs (all labs ordered are listed, but only abnormal results are displayed) Labs Reviewed  CBC WITH DIFFERENTIAL/PLATELET - Abnormal; Notable for the following components:      Result Value   WBC 3.6 (*)    All other components within normal limits  BASIC METABOLIC PANEL - Abnormal; Notable for the following components:   Calcium 8.8 (*)    All other components within normal limits  D-DIMER, QUANTITATIVE (NOT AT [facility]) - Abnormal; Notable for the following components:   D-Dimer, Quant 0.72 (*)    All other components within normal limits    BRAIN NATRIURETIC PEPTIDE - Abnormal; Notable for the following components:   B Natriuretic Peptide 127.6 (*)    All other components within normal limits  URINALYSIS, ROUTINE W REFLEX MICROSCOPIC - Abnormal; Notable for the following components:   Color, Urine COLORLESS (*)    All other components within normal limits  I-STAT TROPONIN, ED   Results for orders placed or performed during the hospital encounter of 12/09/17  CBC with Differential/Platelet  Result Value Ref Range   WBC 3.6 (L) 4.0 - 10.5 K/uL   RBC 4.36 3.87 - 5.11 MIL/uL   Hemoglobin 12.8 12.0 - 15.0 g/dL   HCT 78.2 95.6 - 21.3 %   MCV 90.4 78.0 - 100.0 fL   MCH 29.4 26.0 - 34.0 pg   MCHC 32.5 30.0 - 36.0 g/dL   RDW 08.6 57.8 - 46.9 %   Platelets 209 150 - 400 K/uL   Neutrophils Relative % 67 %   Neutro Abs 2.5 1.7 - 7.7 K/uL   Lymphocytes Relative 24 %   Lymphs Abs 0.9 0.7 - 4.0 K/uL   Monocytes Relative 8 %   Monocytes Absolute 0.3 0.1 - 1.0 K/uL   Eosinophils Relative 1 %   Eosinophils Absolute 0.0 0.0 - 0.7 K/uL   Basophils Relative 0 %   Basophils Absolute 0.0 0.0 - 0.1 K/uL  Basic metabolic panel  Result Value Ref Range   Sodium 143 135 - 145 mmol/L   Potassium 4.0 3.5 - 5.1 mmol/L   Chloride 108 101 - 111 mmol/L   CO2 27 22 - 32 mmol/L   Glucose, Bld 89 65 - 99 mg/dL   BUN 13 6 - 20 mg/dL   Creatinine, Ser 6.29 0.44 - 1.00 mg/dL   Calcium 8.8 (L) 8.9 - 10.3 mg/dL   GFR calc non Af Amer >60 >60 mL/min   GFR calc Af Amer >60 >60 mL/min   Anion gap 8 5 - 15  D-dimer, quantitative (not at [facility])  Result Value Ref Range   D-Dimer, Quant 0.72 (H) 0.00 - 0.50  ug/mL-FEU  Brain natriuretic peptide  Result Value Ref Range   B Natriuretic Peptide 127.6 (H) 0.0 - 100.0 pg/mL  Urinalysis, Routine w reflex microscopic  Result Value Ref Range   Color, Urine COLORLESS (A) YELLOW   APPearance CLEAR CLEAR   Specific Gravity, Urine 1.005 1.005 - 1.030   pH 8.0 5.0 - 8.0   Glucose, UA NEGATIVE NEGATIVE mg/dL    Hgb urine dipstick NEGATIVE NEGATIVE   Bilirubin Urine NEGATIVE NEGATIVE   Ketones, ur NEGATIVE NEGATIVE mg/dL   Protein, ur NEGATIVE NEGATIVE mg/dL   Nitrite NEGATIVE NEGATIVE   Leukocytes, UA NEGATIVE NEGATIVE  I-stat troponin, ED  Result Value Ref Range   Troponin i, poc 0.00 0.00 - 0.08 ng/mL   Comment 3             EKG EKG Interpretation  Date/Time:  Thursday Dec 09 2017 10:54:45 EDT Ventricular Rate:  58 PR Interval:    QRS Duration: 107 QT Interval:  453 QTC Calculation: 445 R Axis:   74 Text Interpretation:  Sinus rhythm Left ventricular hypertrophy Nonspecific T abnormalities, lateral leads No significant change since last tracing Confirmed by Vanetta Mulders [phone]) on 12/09/2017 11:14:08 AM   Radiology Dg Chest 2 View  Result Date: 12/09/2017 CLINICAL DATA:  Hypertension. EXAM: CHEST - 2 VIEW COMPARISON:  Chest x-ray dated July 12, 2017. FINDINGS: Stable borderline cardiomegaly. Normal pulmonary vascularity. No focal consolidation, pleural effusion, or pneumothorax. No acute osseous abnormality. IMPRESSION: No active cardiopulmonary disease. Electronically Signed   By: Obie Dredge M.D.   On: 12/09/2017 13:50    Procedures Procedures (including critical care time)  Medications Ordered in ED Medications  0.9 %  sodium chloride infusion ( Intravenous New Bag/Given 12/09/17 1430)     Initial Impression / Assessment and Plan / ED Course  I have reviewed the triage vital signs and the nursing notes.  Pertinent labs & imaging results that were available during my care of the patient were reviewed by me and considered in my medical decision making (see chart for details).     Patient work-up here cardiac monitoring without any arrhythmias.  Blood pressures were initially elevated did come down as low as 140 but then back up to 160s systolic.  Mostly systolic hypertension.  Chest x-ray negative d-dimer done just because of the unexplained increase in  hypertension it was moderately elevated CT angios been ordered and is pending.  Troponin negative urinalysis negative electrolytes without significant abnormalities.  If CT angios negative for PE patient would discuss with on-call cardiology may not want to start hypertensive meds since no past history of hypertension but since she had called the office.  It may want to start a low-dose of something and then follow-up.  Patient states she was on some kind of medication in the past for heart rate that made her blood pressures too low but she cannot remember what that was.  If it was left up to me I probably just allow her to keep a blood pressure log and close follow-up with cardiology.  But she probably can get seen until next week.  So would recommend consulting with them if there is no evidence of PE obviously if there is a PE patient will require admission.      Final Clinical Impressions(s) / ED Diagnoses   Final diagnoses:  Essential hypertension    ED Discharge Orders    None       Vanetta Mulders, MD 12/09/17 1555

## 2017-12-15 NOTE — Progress Notes (Signed)
HPI The patient presents for followup up of CAD.  She had a PCI of LAD in 2012. She recently underwent total right hip arthroplasty. She presented to the hospital on 06/12/2017 with sudden onset of right-sided neck pain with radiation to the right jaw. CTA of the chest was negative for PE however troponin was elevated at 0.42. Echocardiogram was obtained on 06/13/2017 showed EF 60-65%, mild LVH, grade 1 DD, mild MR, mild TR, peak PA pressure 30 mmHg. Cardiac catheterization performed on 06/14/2017 showed 95% ostial D1 lesion, widely patent ostial LAD stent, 30% proximal LAD stenosis. Medical therapy was recommended. Imdur 60 mg daily was added to her medical regimen. Beta blocker was initially increased and later reduced due to symptomatic bradycardia. At a previous visit I stopped the beta blocker.  She was in the ED after calling our office with headache and reports of arm tingling and facial droop.  I reviewed the ED records.  She was slightly hypertensive.  She had a D dimer for unclear reasons but had a negative CTA for PE or dissection.  She returns for follow up.       On that day she felt dizzy.  She took her blood pressure was elevated with systolics in the 180s.  She repeated it was high again.  She describes symptoms seem like some possible TIA-like symptoms as above and so EMS was called and her blood pressure was 200.  Since that time she has had no further episodes.  She is typically not hypertensive.  She gets along well.  She has not had any new shortness of breath, PND or orthopnea.  She said no palpitations, presyncope or syncope.   Allergies  Allergen Reactions  . Acyclovir And Related Other (See Comments)    unknown  . Pravastatin Sodium Other (See Comments)    cystitis  . Zocor [Simvastatin] Other (See Comments)    cystitis    Current Outpatient Medications  Medication Sig Dispense Refill  . aspirin 81 MG chewable tablet Chew 1 tablet (81 mg total) 2 (two) times daily  by mouth. 60 tablet 1  . Biotin w/ Vitamins C & E (HAIR/SKIN/NAILS PO) Take 1 tablet by mouth daily.    . isosorbide mononitrate (IMDUR) 60 MG 24 hr tablet Take 1 tablet (60 mg total) daily by mouth. 30 tablet 11  . Multiple Vitamins-Calcium (ONE-A-DAY WOMENS PO) Take 1 tablet by mouth daily.     . naproxen sodium (ALEVE) 220 MG tablet Take 220 mg by mouth daily as needed (pain).    . nitroGLYCERIN (NITROSTAT) 0.4 MG SL tablet DISSOLVE 1 TABLET UNDER TONGUE IF NEEDED FOR CHEST PAIN. MAY REPEAT IN 5 MINUTES FOR 3 DOSES. 25 tablet 8  . rosuvastatin (CRESTOR) 10 MG tablet TAKE (1/2) TABLET DAILY. 30 tablet 4  . SYNTHROID 50 MCG tablet TAKE 1 TABLET EACH DAY. 90 tablet 1  . hydrALAZINE (APRESOLINE) 10 MG tablet Take 1 tablet (10 mg total) by mouth 2 (two) times daily as needed. 60 tablet 11   No current facility-administered medications for this visit.     Past Medical History:  Diagnosis Date  . Arthritis    DDD, scoliosis, sees Dr. Lovell Sheehan for this, uses norco very rarely for pain  . CAD (coronary artery disease)    LAD stenting of a 90% lesion 2012  . Cystocele   . Elevated cholesterol   . GERD (gastroesophageal reflux disease)    dx in work up 2016 for atypical  CP at OSH   pt. denies  . Heart murmur   . Hypothyroidism   . Interstitial cystitis    sees Dr. [name]  . Osteoporosis   . Rectocele   . Scoliosis   . Thyroid disease    Hypothyroid  . Urinary incontinence    USI  . Uterine prolapse     Past Surgical History:  Procedure Laterality Date  . BLADDER SUSPENSION  2011  . CORONARY ANGIOPLASTY WITH STENT PLACEMENT  04/21/2010   LAD 80%, RCA 30%, nl EF, s/p DES LAD  . LEFT HEART CATH AND CORONARY ANGIOGRAPHY N/A 06/14/2017   Procedure: LEFT HEART CATH AND CORONARY ANGIOGRAPHY;  Surgeon: Runell Gess, MD;  Location: [facility] INV[facility];  Service: Cardiovascular;  Laterality: N/A;  . OOPHORECTOMY  2011   BSO  . TOTAL HIP ARTHROPLASTY Right 06/10/2017   Procedure: RIGHT  TOTAL HIP ARTHROPLASTY ANTERIOR APPROACH;  Surgeon: Samson Frederic, MD;  Location: [facility];  Service: Orthopedics;  Laterality: Right;  Needs RNFA  . VAGINAL HYSTERECTOMY  2011   [facility] BSO    ROS:   As stated in the HPI and negative for all other systems.  PHYSICAL EXAM BP 133/79   Pulse 77   Ht  (1.626 m)   Wt 130 lb 6.4 oz (59.1 kg)   BMI 22.38 kg/m   GENERAL:  Well appearing NECK:  No jugular venous distention, waveform within normal limits, carotid upstroke brisk and symmetric, no bruits, no thyromegaly LUNGS:  Clear to auscultation bilaterally CHEST:  Unremarkable HEART:  PMI not displaced or sustained,S1 and S2 within normal limits, no S3, no S4, no clicks, no rubs, 2 out of 6 apical systolic murmur radiating slightly anterior capsular tract, no diastolic murmurs murmurs ABD:  Flat, positive bowel sounds normal in frequency in pitch, no bruits, no rebound, no guarding, no midline pulsatile mass, no hepatomegaly, no splenomegaly EXT:  2 plus pulses throughout, no edema, no cyanosis no clubbing   EKG:  NA  Lab Results  Component Value Date   CHOL 160 02/05/2017   TRIG 154 (H) 02/05/2017   HDL 52 02/05/2017   LDLCALC 77 02/05/2017   LDLDIRECT 140.6 05/25/2012     ASSESSMENT AND PLAN   CAD, NATIVE VESSEL -  The patient has no new symptoms.  No change in therapy or further testing.   HYPERTENSION -  Blood pressure had ongoing to give her a prescription for hydralazine a spike as described.  10 mg as needed should this happen again.  She would call our office and give her blood pressure on repeat reading is verified she could use this to bring the pressure down.  If she is having frequent episodes of this we would make further adjustments to her meds.   Hyperlipidemia - She will have repeat labs in July by her primary provider.

## 2017-12-16 ENCOUNTER — Ambulatory Visit: Payer: Medicare Other | Admitting: Cardiology

## 2017-12-16 ENCOUNTER — Encounter: Payer: Self-pay | Admitting: Cardiology

## 2017-12-16 VITALS — BP 133/79 | HR 77 | Ht 64.0 in | Wt 130.4 lb

## 2017-12-16 DIAGNOSIS — E785 Hyperlipidemia, unspecified: Secondary | ICD-10-CM | POA: Diagnosis not present

## 2017-12-16 DIAGNOSIS — I2510 Atherosclerotic heart disease of native coronary artery without angina pectoris: Secondary | ICD-10-CM | POA: Diagnosis not present

## 2017-12-16 DIAGNOSIS — I10 Essential (primary) hypertension: Secondary | ICD-10-CM | POA: Diagnosis not present

## 2017-12-16 MED ORDER — HYDRALAZINE HCL 10 MG PO TABS: 10.0000 mg | ORAL_TABLET | Freq: Two times a day (BID) | ORAL | 11 refills | Status: DC | PRN

## 2017-12-16 NOTE — Patient Instructions (Signed)
Medication Instructions:  START- Hydralazine 10 mg twice a day as needed  If you need a refill on your cardiac medications before your next appointment, please call your pharmacy.  Labwork: None Ordered   Testing/Procedures: None Ordered   Follow-Up: Your physician wants you to follow-up in: 6 Months. You should receive a reminder letter in the mail two months in advance. If you do not receive a letter, please call our office [phone].      Thank you for choosing [facility] at [facility]!!

## 2018-03-02 ENCOUNTER — Telehealth: Payer: Self-pay | Admitting: Family Medicine

## 2018-03-02 NOTE — Telephone Encounter (Signed)
Copied from CRM [phone]#[id number]. Topic: General - Other >> Mar 02, 2018  3:06 PM Gerrianne ScalePayne, Angela L wrote: Reason for CRM: pt calling stating that she had an AWV on 11-30-17 and was advised to come into office to get blood work done please call pt when orders are in

## 2018-03-03 NOTE — Telephone Encounter (Signed)
After reviewing the pts chart I did not see any orders by Dr Selena BattenKim.  I called the pt and she stated she would like to have her thyroid checked and is concerned about her blood pressure medication that was given by Dr Antoine PocheHochrein.  I advised her when she is seen by Dr Selena BattenKim at the visit on 8/13 she can discuss lab testing and I advised she contact Dr Antoine PocheHochrein regarding the medication and she agreed.

## 2018-03-15 ENCOUNTER — Encounter: Payer: Self-pay | Admitting: Family Medicine

## 2018-03-15 ENCOUNTER — Ambulatory Visit: Payer: Medicare Other | Admitting: Family Medicine

## 2018-03-15 VITALS — BP 112/62 | HR 72 | Temp 98.5°F | Ht 64.0 in | Wt 129.8 lb

## 2018-03-15 DIAGNOSIS — I2510 Atherosclerotic heart disease of native coronary artery without angina pectoris: Secondary | ICD-10-CM

## 2018-03-15 DIAGNOSIS — E039 Hypothyroidism, unspecified: Secondary | ICD-10-CM

## 2018-03-15 DIAGNOSIS — I10 Essential (primary) hypertension: Secondary | ICD-10-CM | POA: Diagnosis not present

## 2018-03-15 LAB — BASIC METABOLIC PANEL
BUN: 22 mg/dL (ref 6–23)
CALCIUM: 9.6 mg/dL (ref 8.4–10.5)
CO2: 33 meq/L — AB (ref 19–32)
CREATININE: 0.84 mg/dL (ref 0.40–1.20)
Chloride: 103 mEq/L (ref 96–112)
GFR: 69.17 mL/min (ref >60.00)
GLUCOSE: 100 mg/dL — AB (ref 70–99)
Potassium: 4.1 mEq/L (ref 3.5–5.1)
Sodium: 140 mEq/L (ref 135–145)

## 2018-03-15 LAB — CBC
HCT: 39 % (ref 36.0–46.0)
Hemoglobin: 13.1 g/dL (ref 12.0–15.0)
MCHC: 33.5 g/dL (ref 30.0–36.0)
MCV: 88.8 fl (ref 78.0–100.0)
Platelets: 265 10*3/uL (ref 150.0–400.0)
RBC: 4.39 Mil/uL (ref 3.87–5.11)
RDW: 13.8 % (ref 11.5–15.5)
WBC: 4.2 10*3/uL (ref 4.0–10.5)

## 2018-03-15 LAB — TSH: TSH: 0.89 u[IU]/mL (ref 0.35–4.50)

## 2018-03-15 NOTE — Progress Notes (Signed)
HPI:  Using dictation device. Unfortunately this device frequently misinterprets words/phrases.  Kaitlyn Parks is apleasant 83 yo here for follow up. PMH reviewed below. Concerned about BP as was elevated a dentist appt yesterday. Was in for a possible root canal, but ended up not needing the root canal. She see cardiology for her hx of CAD and HTN. On imdur and hydralazine. Reports did not tolerate metoprolol so not on BB. Does take asa daily. She also has had reactions to several statins, but tolerates crestor. She is on a low dose of synthroid for hypothyroidism. Denies CP, SOB, DOE.REports had her first does of shinrix at the pharmacy and will bring a record of this next time.  ROS: See pertinent positives and negatives per HPI.  Past Medical History:  Diagnosis Date  . Arthritis    DDD, scoliosis, sees Dr. Lovell SheehanJenkins for this, uses norco very rarely for pain  . CAD (coronary artery disease)    LAD stenting of a 90% lesion 2012  . Cystocele   . Elevated cholesterol   . GERD (gastroesophageal reflux disease)    dx in work up 2016 for atypical CP at OSH   pt. denies  . Heart murmur   . Hypothyroidism   . Interstitial cystitis    sees Dr. Annabell HowellsWrenn  . Osteoporosis   . Rectocele   . Scoliosis   . Thyroid disease    Hypothyroid  . Urinary incontinence    USI  . Uterine prolapse     Past Surgical History:  Procedure Laterality Date  . BLADDER SUSPENSION  2011  . CORONARY ANGIOPLASTY WITH STENT PLACEMENT  04/21/2010   LAD 80%, RCA 30%, nl EF, s/p DES LAD  . LEFT HEART CATH AND CORONARY ANGIOGRAPHY N/A 06/14/2017   Procedure: LEFT HEART CATH AND CORONARY ANGIOGRAPHY;  Surgeon: Runell GessBerry, Jonathan J, MD;  [facility];  Service: Cardiovascular;  Laterality: N/A;  . OOPHORECTOMY  2011   BSO  . TOTAL HIP ARTHROPLASTY Right 06/10/2017   Procedure: RIGHT TOTAL HIP ARTHROPLASTY ANTERIOR APPROACH;  Surgeon: Samson FredericSwinteck, Brian, MD;  Location: [facility];  Service: Orthopedics;   Laterality: Right;  Needs RNFA  . VAGINAL HYSTERECTOMY  2011   LAVH BSO    Family History  Problem Relation Age of Onset  . Hypertension Mother   . Heart disease Mother   . Heart disease Father   . Heart disease Sister   . Colon cancer Paternal Aunt [id number]  . Breast cancer Paternal Aunt        Age 81's  . Diabetes Sister   . Endometrial cancer Sister   . Breast cancer Cousin        Maternal 1st cousins-Age 81's  . Leukemia Paternal Aunt     SOCIAL HX: see hpi   Current Outpatient Medications:  .  aspirin 81 MG chewable tablet, Chew 1 tablet (81 mg total) 2 (two) times daily by mouth., Disp: 60 tablet, Rfl: 1 .  Biotin w/ Vitamins C & E (HAIR/SKIN/NAILS PO), Take 1 tablet by mouth daily., Disp: , Rfl:  .  hydrALAZINE (APRESOLINE) 10 MG tablet, Take 1 tablet (10 mg total) by mouth 2 (two) times daily as needed., Disp: 60 tablet, Rfl: 11 .  isosorbide mononitrate (IMDUR) 60 MG 24 hr tablet, Take 1 tablet (60 mg total) daily by mouth., Disp: 30 tablet, Rfl: 11 .  Multiple Vitamins-Calcium (ONE-A-DAY WOMENS PO), Take 1 tablet by mouth daily. , Disp: , Rfl:  .  naproxen sodium (  ALEVE) 220 MG tablet, Take 220 mg by mouth daily as needed (pain)., Disp: , Rfl:  .  nitroGLYCERIN (NITROSTAT) 0.4 MG SL tablet, DISSOLVE 1 TABLET UNDER TONGUE IF NEEDED FOR CHEST PAIN. MAY REPEAT IN 5 MINUTES FOR 3 DOSES., Disp: 25 tablet, Rfl: 8 .  rosuvastatin (CRESTOR) 10 MG tablet, TAKE (1/2) TABLET DAILY., Disp: 30 tablet, Rfl: 4 .  SYNTHROID 50 MCG tablet, TAKE 1 TABLET EACH DAY., Disp: 90 tablet, Rfl: 1  EXAM:  Vitals:   03/15/18 1435  BP: 112/62  Pulse: 72  Temp: 98.5 F (36.9 C)    Body mass index is 22.28 kg/m.  GENERAL: vitals reviewed and listed above, alert, oriented, appears well hydrated and in no acute distress  HEENT: atraumatic, conjunttiva clear, no obvious abnormalities on inspection of external nose and ears  NECK: no obvious masses on inspection  LUNGS: clear to auscultation  bilaterally, no wheezes, rales or rhonchi, good air movement  CV: HRRR, no peripheral edema  MS: moves all extremities without noticeable abnormality  PSYCH: pleasant and cooperative, no obvious depression or anxiety  ASSESSMENT AND PLAN:  Discussed the following assessment and plan:  Hypothyroidism, unspecified type - Plan: TSH  Essential hypertension - Plan: Basic metabolic panel, CBC  Atherosclerosis of native coronary artery of native heart without angina pectoris  -bp actually quite good today, if anything on the low side, since she feels well today, will continue current regimen -labs per orders -lifestyle recs -advised flu shot in october -Patient advised to return or notify a doctor sooner if further or new concerns arise.  Patient Instructions  BEFORE YOU LEAVE: -labs -follow up: 3-4 months  Flu shot in October.  We have ordered labs or studies at this visit. It can take up to 1-2 weeks for results and processing. IF results require follow up or explanation, we will call you with instructions. Clinically stable results will be released to your [facility]. If you have not heard from [place] or cannot find your results in [facility] in 2 weeks please contact our office at [phone].  If you are not yet signed up for [facility], please consider signing up.   We recommend the following healthy lifestyle for LIFE: 1) Small portions. But, make sure to get regular (at least 3 per day), healthy meals and small healthy snacks if needed.  2) Eat a healthy clean diet.   TRY TO EAT: -at least 5-7 servings of low sugar, colorful, and nutrient rich vegetables per day (not corn, potatoes or bananas.) -berries are the best choice if you wish to eat fruit (only eat small amounts if trying to reduce weight)  -lean meets (fish, white meat of chicken or [place]) -vegan proteins for some meals - beans or tofu, whole grains, nuts and seeds -Replace bad fats with good fats - good fats include:  fish, nuts and seeds, canola oil, olive oil -small amounts of low fat or non fat dairy -small amounts of100 % whole grains - check the lables -drink plenty of water  AVOID: -SUGAR, sweets, anything with added sugar, corn syrup or sweeteners - must read labels as even foods advertised as "healthy" often are loaded with sugar -if you must have a sweetener, small amounts of stevia may be best -sweetened beverages and artificially sweetened beverages -simple starches (rice, bread, potatoes, pasta, chips, etc - small amounts of 100% whole grains are ok) -red meat, pork, butter -fried foods, fast food, processed food, excessive dairy, eggs and coconut.  3)Get at least  150 minutes of sweaty aerobic exercise per week.  4)Reduce stress - consider counseling, meditation and relaxation to balance other aspects of your life.          Terressa KoyanagiHannah R Ramere Downs, DO

## 2018-03-15 NOTE — Patient Instructions (Signed)
BEFORE YOU LEAVE: -labs -follow up: 3-4 months  Flu shot in October.  We have ordered labs or studies at this visit. It can take up to 1-2 weeks for results and processing. IF results require follow up or explanation, we will call you with instructions. Clinically stable results will be released to your [facility]. If you have not heard from us or cannot find your results in [facility] in 2 weeks please contact our office at [phone].  If you are not yet signed up for [facility], please consider signing up.   We recommend the following healthy lifestyle for LIFE: 1) Small portions. But, make sure to get regular (at least 3 per day), healthy meals and small healthy snacks if needed.  2) Eat a healthy clean diet.   TRY TO EAT: -at least 5-7 servings of low sugar, colorful, and nutrient rich vegetables per day (not corn, potatoes or bananas.) -berries are the best choice if you wish to eat fruit (only eat small amounts if trying to reduce weight)  -lean meets (fish, white meat of chicken or [place]) -vegan proteins for some meals - beans or tofu, whole grains, nuts and seeds -Replace bad fats with good fats - good fats include: fish, nuts and seeds, canola oil, olive oil -small amounts of low fat or non fat dairy -small amounts of100 % whole grains - check the lables -drink plenty of water  AVOID: -SUGAR, sweets, anything with added sugar, corn syrup or sweeteners - must read labels as even foods advertised as "healthy" often are loaded with sugar -if you must have a sweetener, small amounts of stevia may be best -sweetened beverages and artificially sweetened beverages -simple starches (rice, bread, potatoes, pasta, chips, etc - small amounts of 100% whole grains are ok) -red meat, pork, butter -fried foods, fast food, processed food, excessive dairy, eggs and coconut.  3)Get at least 150 minutes of sweaty aerobic exercise per week.  4)Reduce stress - consider counseling, meditation and  relaxation to balance other aspects of your life.

## 2018-05-19 ENCOUNTER — Other Ambulatory Visit: Payer: Self-pay | Admitting: Family Medicine

## 2018-05-19 ENCOUNTER — Other Ambulatory Visit: Payer: Self-pay | Admitting: Cardiology

## 2018-06-25 ENCOUNTER — Other Ambulatory Visit: Payer: Self-pay | Admitting: Physician Assistant

## 2018-06-28 NOTE — Progress Notes (Addendum)
[facility] - Clinic Note  06/29/2018     CHIEF COMPLAINT Patient presents for Retina Evaluation   HISTORY OF PRESENT ILLNESS: Kaitlyn Parks is a 83 y.o. female who presents to the clinic today for:   HPI    Retina Evaluation    In both eyes.  This started 1 day ago.  Associated Symptoms Floaters.  Negative for Flashes, Pain, Trauma, Fever, Weight Loss, Scalp Tenderness, Redness, Distortion, Photophobia, Jaw Claudication, Shoulder/Hip pain, Glare, Blind Spot and Fatigue.  Context:  distance vision, mid-range vision and near vision.  Treatments tried include surgery.  Response to treatment was significant improvement.  I, the attending physician,  performed the HPI with the patient and updated documentation appropriately.          Comments    Referral of DR. DeMarco for retina eval./Change in [facility] monitor results.Patient states she received  a call yesterday from Dr. Krista Blue office telling her she needed further evaluation by retina specialist due to a change in [employer] test that was  done on Monday(911/25/19). Pt states she has not noticed a change in her vision. Pt states she has occasional floaters when out in the sun,but she has not had them in a while. Denies flashes and ocular pain.Pt reports she had cataract  sx with lens implants OU 2017.  Pt uses Dry eye gtt's PRN       Last edited by Rennis Chris, MD on 06/29/2018 10:23 AM. (History)    pt states she saw Dr. Krista Blue yesterday bc her Forsee device told her she had a change in her vision, pt states she does not feel like there has been a change, pt states that she does not have a retina specialist, but Dr. Elmer Picker prescribed the [place] and is monitoring her ARMD, pt states Dr. Krista Blue did not think there had been any changes in her ARMD  Referring physician: Mateo Flow, MD [address] [place], [place] [address]  HISTORICAL INFORMATION:   Selected notes from the MEDICAL RECORD NUMBER  Referred by Dr. [place] DeMarco for concern of exu ARMD LEE: 11.26.19 (J. 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 (Ophthalmic Drugs)   No current facility-administered medications for this visit.  (Ophthalmic Drugs)   Current Outpatient Medications (Other)  Medication Sig  . aspirin 81 MG chewable tablet Chew 1 tablet (81 mg total) 2 (two) times daily by mouth.  . Biotin w/ Vitamins C & E (HAIR/SKIN/NAILS PO) Take 1 tablet by mouth daily.  . hydrALAZINE (APRESOLINE) 10 MG tablet Take 1 tablet (10 mg total) by mouth 2 (two) times daily as needed.  . isosorbide mononitrate (IMDUR) 60 MG 24 hr tablet Take 1 tablet (60 mg total) daily by mouth.  . isosorbide mononitrate (IMDUR) 60 MG 24 hr tablet TAKE 1 TABLET ONCE DAILY.  . Multiple Vitamins-Calcium (ONE-A-DAY WOMENS PO) Take 1 tablet by mouth daily.   . naproxen sodium (ALEVE) 220 MG tablet Take 220 mg by mouth daily as needed (pain).  . nitroGLYCERIN (NITROSTAT) 0.4 MG SL tablet DISSOLVE 1 TABLET UNDER TONGUE IF NEEDED FOR CHEST PAIN. MAY REPEAT IN 5 MINUTES FOR 3 DOSES.  Marland Kitchen rosuvastatin (CRESTOR) 10 MG tablet TAKE (1/2) TABLET DAILY.  . SYNTHROID 50 MCG tablet TAKE 1 TABLET EACH DAY.   No current facility-administered medications for this visit.  (Other)  REVIEW OF SYSTEMS: ROS    Positive for: Eyes   Negative for: Constitutional, Gastrointestinal, Neurological, Skin, Genitourinary, Musculoskeletal, HENT, Endocrine, Cardiovascular, Respiratory, Psychiatric, Allergic/Imm, Heme/Lymph   Last edited by Eldridge Scot, LPN on 16/05/9603  8:19 AM. (History)       ALLERGIES Allergies  Allergen Reactions  . Acyclovir And Related Other (See Comments)    unknown  . Pravastatin Sodium Other (See Comments)    cystitis  . Zocor [Simvastatin] Other (See Comments)     cystitis    PAST MEDICAL HISTORY Past Medical History:  Diagnosis Date  . Arthritis    DDD, scoliosis, sees Dr. Lovell Sheehan for this, uses norco very rarely for pain  . CAD (coronary artery disease)    LAD stenting of a 90% lesion 2012  . Cystocele   . Elevated cholesterol   . GERD (gastroesophageal reflux disease)    dx in work up 2016 for atypical CP at OSH   pt. denies  . Heart murmur   . Hypothyroidism   . Interstitial cystitis    sees Dr. [name]  . Osteoporosis   . Rectocele   . Scoliosis   . Thyroid disease    Hypothyroid  . Urinary incontinence    USI  . Uterine prolapse    Past Surgical History:  Procedure Laterality Date  . BLADDER SUSPENSION  2011  . CATARACT EXTRACTION    . CORONARY ANGIOPLASTY WITH STENT PLACEMENT  04/21/2010   LAD 80%, RCA 30%, nl EF, s/p DES LAD  . EYE SURGERY    . LEFT HEART CATH AND CORONARY ANGIOGRAPHY N/A 06/14/2017   Procedure: LEFT HEART CATH AND CORONARY ANGIOGRAPHY;  Surgeon: Runell Gess, MD;  Location: [facility];  Service: Cardiovascular;  Laterality: N/A;  . OOPHORECTOMY  2011   BSO  . TOTAL HIP ARTHROPLASTY Right 06/10/2017   Procedure: RIGHT TOTAL HIP ARTHROPLASTY ANTERIOR APPROACH;  Surgeon: Samson Frederic, MD;  Location: [facility];  Service: Orthopedics;  Laterality: Right;  Needs RNFA  . VAGINAL HYSTERECTOMY  2011   LAVH BSO    FAMILY HISTORY Family History  Problem Relation Age of Onset  . Hypertension Mother   . Heart disease Mother   . Heart disease Father   . Heart disease Sister   . Colon cancer Paternal Aunt 6  . Breast cancer Paternal Aunt        Age 17's  . Diabetes Sister   . Endometrial cancer Sister   . Breast cancer Cousin        Maternal 1st cousins-Age 52's  . Leukemia Paternal Aunt     SOCIAL HISTORY Social History   Tobacco Use  . Smoking status: Never Smoker  . Smokeless tobacco: Never Used  Substance Use Topics  . Alcohol use: No    Comment: Rare  . Drug use: No          OPHTHALMIC EXAM:  Base Eye Exam    Visual Acuity (Snellen - Linear)      Right Left   Dist [place] 20/30 20/40   Dist ph [place] NI NI       Tonometry (Tonopen, 8:27 AM)      Right Left   Pressure 14 14       Pupils      Dark Light Shape React APD   Right 3 2 Round Brisk None   Left 3 2 Round Brisk None       Visual Fields (Counting fingers)  Left Right    Full Full       Extraocular Movement      Right Left    Full, Ortho Full, Ortho       Neuro/Psych    Oriented x3:  Yes   Mood/Affect:  Normal       Dilation    Both eyes:  1.0% Mydriacyl, 2.5% Phenylephrine @ 8:21 AM        Slit Lamp and Fundus Exam    Slit Lamp Exam      Right Left   Lids/Lashes mild Telangiectasia, marginal leasion nasal UL Telangiectasia, Meibomian gland dysfunction   Conjunctiva/Sclera White and quiet White and quiet   Cornea Arcus, 1 Punctate epithelial erosions 1-2+ Punctate epithelial erosions, Arcus   Anterior Chamber Deep and clear Deep and clear   Iris Round and dilated Round and well dilated   Lens PC IOL in good position PC IOL in excellent position   Vitreous Vitreous syneresis, Posterior vitreous detachment Vitreous syneresis, Posterior vitreous detachment       Fundus Exam      Right Left   Disc Pink and Sharp, mild PPP Pink and Sharp, mild temporal PPA   C/D Ratio 0.4 0.4   Macula Blunted foveal reflex, Drusen, RPE mottling and clumping, early Atrophy, +PEDs,  No heme or edema Flat, Blunted foveal reflex, Drusen, RPE mottling and clumping, focal Atrophy; no heme   Vessels Vascular attenuation Vascular attenuation   Periphery Attached, scattered reticular degeneration   Attached, scattered reticular degeneration          IMAGING AND PROCEDURES  Imaging and Procedures for @TODAY @  OCT, Retina - OU - Both Eyes       Right Eye Quality was good. Central Foveal Thickness: 275. Progression has no prior data. Findings include abnormal foveal contour, retinal drusen ,  pigment epithelial detachment, outer retinal atrophy, intraretinal hyper-reflective material (Focal area of IRHM overlying temporal PED).   Left Eye Quality was good. Central Foveal Thickness: 287. Progression has no prior data. Findings include abnormal foveal contour, retinal drusen , no SRF, intraretinal fluid, outer retinal atrophy, pigment epithelial detachment, intraretinal hyper-reflective material (Cystic changes in Eye Surgery CenterRHM overlying PED inferior to fovea).   Notes *Images captured and stored on drive  Diagnosis / Impression:  nonexudative ARMD OU   Clinical management:  See below  Abbreviations: NFP - Normal foveal profile. CME - cystoid macular edema. PED - pigment epithelial detachment. IRF - intraretinal fluid. SRF - subretinal fluid. EZ - ellipsoid zone. ERM - epiretinal membrane. ORA - outer retinal atrophy. ORT - outer retinal tubulation. SRHM - subretinal hyper-reflective material                 ASSESSMENT/PLAN:    ICD-10-CM   1. Intermediate stage nonexudative age-related macular degeneration of both eyes H35.3132   2. Retinal edema H35.81 OCT, Retina - OU - Both Eyes  3. Hypertensive retinopathy of both eyes H35.033   4. Pseudophakia of both eyes Z96.1     1,2. Age related macular degeneration, non-exudative, both eyes  - presents today due to alert from [employer] home monitoring system for OD  - [place]see prescribed by Dr. Elmer PickerHecker  - pt denies any subjective change in vision  - no heme or obvious CNVM on exam  - OCT shows PEDs with tr cystic changes  - recommend FA today, but pt unable to stay for test  - The incidence, anatomy, and pathology of dry AMD, risk of progression, and the  AREDS and AREDS 2 study including smoking risks discussed with patient.  - Recommend amsler grid monitoring  - f/u 2 weeks, DFE, OCT, FA (Optos, transit  OD), sooner prn  3. Hypertensive retinopathy OU - discussed importance of tight BP control - monitor  4. Pseudophakia  OU  - s/p CE/IOL OU  - beautiful surgeries, doing well  - monitor   Ophthalmic Meds Ordered this visit:  No orders of the defined types were placed in this encounter.      Return in about 2 weeks (around 07/13/2018) for F/U non-exu ARMD OU, DFE, OCT, FA (optos, transit OD).  There are no Patient Instructions on file for this visit.   Explained the diagnoses, plan, and follow up with the patient and they expressed understanding.  Patient expressed understanding of the importance of proper follow up care.   This document serves as a record of services personally performed by Karie Chimera, MD, PhD. It was created on their behalf by Laurian Brim, OA, an ophthalmic assistant. The creation of this record is the provider's dictation and/or activities during the visit.    Electronically signed by: Laurian Brim, OA  11.26.19 11:36 AM    Karie Chimera, M.D., Ph.D. Diseases & Surgery of the Retina and Vitreous [facility]   I have reviewed the above documentation for accuracy and completeness, and I agree with the above. Karie Chimera, M.D., Ph.D. 06/29/18 11:36 AM     Abbreviations: M myopia (nearsighted); A astigmatism; H hyperopia (farsighted); P presbyopia; Mrx spectacle prescription;  CTL contact lenses; OD right eye; OS left eye; OU both eyes  XT exotropia; ET esotropia; PEK punctate epithelial keratitis; PEE punctate epithelial erosions; DES dry eye syndrome; MGD meibomian gland dysfunction; ATs artificial tears; PFAT's preservative free artificial tears; NSC nuclear sclerotic cataract; PSC posterior subcapsular cataract; ERM epi-retinal membrane; PVD posterior vitreous detachment; RD retinal detachment; DM diabetes mellitus; DR diabetic retinopathy; NPDR non-proliferative diabetic retinopathy; PDR proliferative diabetic retinopathy; CSME clinically significant macular edema; DME diabetic macular edema; dbh dot blot hemorrhages; CWS cotton wool spot; POAG  primary open angle glaucoma; C/D cup-to-disc ratio; HVF humphrey visual field; GVF goldmann visual field; OCT optical coherence tomography; IOP intraocular pressure; BRVO Branch retinal vein occlusion; CRVO central retinal vein occlusion; CRAO central retinal artery occlusion; BRAO branch retinal artery occlusion; RT retinal tear; SB scleral buckle; PPV pars plana vitrectomy; VH Vitreous hemorrhage; PRP panretinal laser photocoagulation; IVK intravitreal kenalog; VMT vitreomacular traction; MH Macular hole;  NVD neovascularization of the disc; NVE neovascularization elsewhere; AREDS age related eye disease study; ARMD age related macular degeneration; POAG primary open angle glaucoma; EBMD epithelial/anterior basement membrane dystrophy; ACIOL anterior chamber intraocular lens; IOL intraocular lens; PCIOL posterior chamber intraocular lens; Phaco/IOL phacoemulsification with intraocular lens placement; PRK photorefractive keratectomy; LASIK laser assisted in situ keratomileusis; HTN hypertension; DM diabetes mellitus; COPD chronic obstructive pulmonary disease

## 2018-06-29 ENCOUNTER — Encounter (INDEPENDENT_AMBULATORY_CARE_PROVIDER_SITE_OTHER): Payer: Self-pay | Admitting: Ophthalmology

## 2018-06-29 ENCOUNTER — Ambulatory Visit (INDEPENDENT_AMBULATORY_CARE_PROVIDER_SITE_OTHER): Payer: Medicare Other | Admitting: Ophthalmology

## 2018-06-29 DIAGNOSIS — H353132 Nonexudative age-related macular degeneration, bilateral, intermediate dry stage: Secondary | ICD-10-CM | POA: Diagnosis not present

## 2018-06-29 DIAGNOSIS — Z961 Presence of intraocular lens: Secondary | ICD-10-CM

## 2018-06-29 DIAGNOSIS — H3581 Retinal edema: Secondary | ICD-10-CM | POA: Diagnosis not present

## 2018-06-29 DIAGNOSIS — H35033 Hypertensive retinopathy, bilateral: Secondary | ICD-10-CM

## 2018-07-07 ENCOUNTER — Ambulatory Visit: Payer: Medicare Other | Admitting: Family Medicine

## 2018-07-07 ENCOUNTER — Encounter: Payer: Self-pay | Admitting: Family Medicine

## 2018-07-07 VITALS — BP 120/62 | HR 73 | Temp 97.4°F | Ht 64.0 in | Wt 126.9 lb

## 2018-07-07 DIAGNOSIS — I2510 Atherosclerotic heart disease of native coronary artery without angina pectoris: Secondary | ICD-10-CM

## 2018-07-07 DIAGNOSIS — E785 Hyperlipidemia, unspecified: Secondary | ICD-10-CM | POA: Diagnosis not present

## 2018-07-07 DIAGNOSIS — I10 Essential (primary) hypertension: Secondary | ICD-10-CM

## 2018-07-07 DIAGNOSIS — E039 Hypothyroidism, unspecified: Secondary | ICD-10-CM

## 2018-07-07 NOTE — Progress Notes (Signed)
HPI:  Using dictation device. Unfortunately this device frequently misinterprets words/phrases.  Kaitlyn Parks is a pleasant 83 y.o. here for follow up. Chronic medical problems summarized below were reviewed for changes and stability and were updated as needed below. These issues and their treatment remain stable for the most part. Reports doing well for the most part. Saw Dr. Vanessa Barbara, retinal, for macular degeneration, monitoring. She denies visual changes. BP well controlled. Denies CP, SOB, DOE, treatment intolerance or new symptoms.  HTN/HLD/CAD: -sees cardiologist -s/p stent 2012 LAD -meds: imdur, asa, hydralazine, crestor low dose due to intol -hx several medication intolerances  Hypothyroidism: -meds: synthroid  ROS: See pertinent positives and negatives per HPI.  Past Medical History:  Diagnosis Date  . Arthritis    DDD, scoliosis, sees Dr. Lovell Sheehan for this, uses norco very rarely for pain  . Bradycardia 11/11/2017  . CAD (coronary artery disease)    LAD stenting of a 90% lesion 2012  . Cystocele   . Elevated cholesterol   . GERD (gastroesophageal reflux disease)    dx in work up 2016 for atypical CP at OSH   pt. denies  . Heart murmur   . Hypothyroidism   . Interstitial cystitis    sees Dr. [name]  . NSTEMI (non-ST elevated myocardial infarction) (HCC)   . Osteoporosis   . Rectocele   . 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 Scoliosis   . Thyroid disease    Hypothyroid  . Urinary incontinence    USI  . Uterine prolapse     Past Surgical History:  Procedure Laterality Date  . BLADDER SUSPENSION  2011  . CATARACT EXTRACTION    . CORONARY ANGIOPLASTY WITH STENT PLACEMENT  04/21/2010   LAD 80%, RCA 30%, nl EF, s/p DES LAD  . EYE SURGERY    . LEFT HEART CATH AND CORONARY ANGIOGRAPHY N/A 06/14/2017   Procedure: LEFT HEART CATH AND CORONARY ANGIOGRAPHY;  Surgeon: Runell Gess, MD;  [facility];  Service: Cardiovascular;  Laterality: N/A;   . OOPHORECTOMY  2011   BSO  . TOTAL HIP ARTHROPLASTY Right 06/10/2017   Procedure: RIGHT TOTAL HIP ARTHROPLASTY ANTERIOR APPROACH;  Surgeon: Samson Frederic, MD;  Location: [facility];  Service: Orthopedics;  Laterality: Right;  Needs RNFA  . VAGINAL HYSTERECTOMY  2011   LAVH BSO    Family History  Problem Relation Age of Onset  . Hypertension Mother   . Heart disease Mother   . Heart disease Father   . Heart disease Sister   . Colon cancer Paternal Aunt 5  . Breast cancer Paternal Aunt        Age 37's  . Diabetes Sister   . Endometrial cancer Sister   . Breast cancer Cousin        Maternal 1st cousins-Age 71's  . Leukemia Paternal Aunt     SOCIAL HX: see hpi   Current Outpatient Medications:  .  aspirin 81 MG chewable tablet, Chew 1 tablet (81 mg total) 2 (two) times daily by mouth., Disp: 60 tablet, Rfl: 1 .  Biotin w/ Vitamins C & E (HAIR/SKIN/NAILS PO), Take 1 tablet by mouth daily., Disp: , Rfl:  .  hydrALAZINE (APRESOLINE) 10 MG tablet, Take 1 tablet (10 mg total) by mouth 2 (two) times daily as needed., Disp: 60 tablet, Rfl: 11 .  isosorbide mononitrate (IMDUR) 60 MG 24 hr tablet, Take 1 tablet (60 mg total) daily by mouth., Disp: 30 tablet, Rfl: 11 .  Multiple Vitamins-Calcium (ONE-A-DAY WOMENS PO), Take 1 tablet by mouth daily. , Disp: , Rfl:  .  nitroGLYCERIN (NITROSTAT) 0.4 MG SL tablet, DISSOLVE 1 TABLET UNDER TONGUE IF NEEDED FOR CHEST PAIN. MAY REPEAT IN 5 MINUTES FOR 3 DOSES., Disp: 25 tablet, Rfl: 8 .  rosuvastatin (CRESTOR) 10 MG tablet, TAKE (1/2) TABLET DAILY., Disp: 15 tablet, Rfl: 2 .  SYNTHROID 50 MCG tablet, TAKE 1 TABLET EACH DAY., Disp: 90 tablet, Rfl: 1  EXAM:  Vitals:   07/07/18 0944  BP: 120/62  Pulse: 73  Temp: (!) 97.4 F (36.3 C)    Body mass index is 21.78 kg/m.  GENERAL: vitals reviewed and listed above, alert, oriented, appears well hydrated and in no acute distress  HEENT: atraumatic, conjunttiva clear, no obvious abnormalities on  inspection of external nose and ears  NECK: no obvious masses on inspection  LUNGS: clear to auscultation bilaterally, no wheezes, rales or rhonchi, good air movement  CV: HRRR, sem, no peripheral edema  MS: moves all extremities without noticeable abnormality  PSYCH: pleasant and cooperative, no obvious depression or anxiety  ASSESSMENT AND PLAN:  Discussed the following assessment and plan:  Coronary artery disease involving native heart without angina pectoris, unspecified vessel or lesion type  Hypothyroidism, unspecified type - Plan: TSH  Dyslipidemia - Plan: Lipid panel  Essential hypertension - Plan: Basic metabolic panel, CBC  -lifestyle recs -advised to bring vaccine record - gets vaccines at [facility] -labs per orders - she will return for fasting labs -for eye issues, glad seeing Dr. Vanessa BarbaraZamora - in good hands -Annual exam in April -cont current meds -follow up sooner as needed  Patient Instructions  BEFORE YOU LEAVE: -follow up: Annual exam in April -lab visit in next 1-2 weeks for FASTING labs  We have ordered labs or studies at this visit. It can take up to 1-2 weeks for results and processing. IF results require follow up or explanation, we will call you with instructions. Clinically stable results will be released to your [facility]. If you have not heard from us or cannot find your results in [facility] in 2 weeks please contact our office at [phone].  If you are not yet signed up for [facility], please consider signing up.   We recommend the following healthy lifestyle for LIFE: 1) Small portions. But, make sure to get regular (at least 3 per day), healthy meals and small healthy snacks if needed.  2) Eat a healthy clean diet.   TRY TO EAT: -at least 5-7 servings of low sugar, colorful, and nutrient rich vegetables per day (not corn, potatoes or bananas.) -berries are the best choice if you wish to eat fruit (only eat small amounts if trying to reduce weight)   -lean meets (fish, white meat of chicken or [place]key) -vegan proteins for some meals - beans or tofu, whole grains, nuts and seeds -Replace bad fats with good fats - good fats include: fish, nuts and seeds, canola oil, olive oil -small amounts of low fat or non fat dairy -small amounts of100 % whole grains - check the lables -drink plenty of water  AVOID: -SUGAR, sweets, anything with added sugar, corn syrup or sweeteners - must read labels as even foods advertised as "healthy" often are loaded with sugar -if you must have a sweetener, small amounts of stevia may be best -sweetened beverages and artificially sweetened beverages -simple starches (rice, bread, potatoes, pasta, chips, etc - small amounts of 100% whole grains are ok) -red meat, pork, butter -  fried foods, fast food, processed food, excessive dairy, eggs and coconut.  3)Get at least 150 minutes of sweaty aerobic exercise per week.  4)Reduce stress - consider counseling, meditation and relaxation to balance other aspects of your life.         Terressa Koyanagi, DO

## 2018-07-07 NOTE — Patient Instructions (Signed)
BEFORE YOU LEAVE: -follow up: Annual exam in April -lab visit in next 1-2 weeks for FASTING labs  We have ordered labs or studies at this visit. It can take up to 1-2 weeks for results and processing. IF results require follow up or explanation, we will call you with instructions. Clinically stable results will be released to your [facility]. If you have not heard from us or cannot find your results in [facility] in 2 weeks please contact our office at [phone].  If you are not yet signed up for [facility], please consider signing up.   We recommend the following healthy lifestyle for LIFE: 1) Small portions. But, make sure to get regular (at least 3 per day), healthy meals and small healthy snacks if needed.  2) Eat a healthy clean diet.   TRY TO EAT: -at least 5-7 servings of low sugar, colorful, and nutrient rich vegetables per day (not corn, potatoes or bananas.) -berries are the best choice if you wish to eat fruit (only eat small amounts if trying to reduce weight)  -lean meets (fish, white meat of chicken or [place]key) -vegan proteins for some meals - beans or tofu, whole grains, nuts and seeds -Replace bad fats with good fats - good fats include: fish, nuts and seeds, canola oil, olive oil -small amounts of low fat or non fat dairy -small amounts of100 % whole grains - check the lables -drink plenty of water  AVOID: -SUGAR, sweets, anything with added sugar, corn syrup or sweeteners - must read labels as even foods advertised as "healthy" often are loaded with sugar -if you must have a sweetener, small amounts of stevia may be best -sweetened beverages and artificially sweetened beverages -simple starches (rice, bread, potatoes, pasta, chips, etc - small amounts of 100% whole grains are ok) -red meat, pork, butter -fried foods, fast food, processed food, excessive dairy, eggs and coconut.  3)Get at least 150 minutes of sweaty aerobic exercise per week.  4)Reduce stress - consider  counseling, meditation and relaxation to balance other aspects of your life.

## 2018-07-15 NOTE — Progress Notes (Signed)
[facility] - Clinic Note  07/18/2018     CHIEF COMPLAINT Patient presents for Retina Follow Up   HISTORY OF PRESENT ILLNESS: Kaitlyn Parks is a 83 y.o. female who presents to the clinic today for:   HPI    Retina Follow Up    Patient presents with  Dry AMD.  In both eyes.  This started 3 weeks ago.  Severity is moderate.  Since onset it is stable.  I, the attending physician,  performed the HPI with the patient and updated documentation appropriately.          Comments    Return Retina follow up for Armd. FA/OCT today, Patient states no vision changes.       Last edited by Rennis ChrisZamora, Jordann Grime, MD on 07/18/2018  2:47 PM. (History)    pt states she has been seeing Dr. Selena BattenKim for about 5 years  Referring physician: Mateo FlowHecker, Kathryn, MD [address] [place], [place] [id number]  HISTORICAL INFORMATION:   Selected notes from the MEDICAL RECORD NUMBER Referred by Dr. [place]azil[place] DeMarco for concern of exu ARMD LEE: 11.26.19 (J. 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 (Ophthalmic Drugs)   No current facility-administered medications for this visit.  (Ophthalmic Drugs)   Current Outpatient Medications (Other)  Medication Sig  . aspirin 81 MG chewable tablet Chew 1 tablet (81 mg total) 2 (two) times daily by mouth.  . Biotin w/ Vitamins C & E (HAIR/SKIN/NAILS PO) Take 1 tablet by mouth daily.  . hydrALAZINE (APRESOLINE) 10 MG tablet Take 1 tablet (10 mg total) by mouth 2 (two) times daily as needed.  . isosorbide mononitrate (IMDUR) 60 MG 24 hr tablet Take 1 tablet (60 mg total) daily by mouth.  . Multiple Vitamins-Calcium (ONE-A-DAY WOMENS PO) Take 1 tablet by mouth daily.   . nitroGLYCERIN (NITROSTAT) 0.4 MG SL tablet DISSOLVE 1 TABLET UNDER TONGUE IF NEEDED FOR CHEST PAIN. MAY REPEAT IN 5 MINUTES  FOR 3 DOSES.  Marland Kitchen. rosuvastatin (CRESTOR) 10 MG tablet TAKE (1/2) TABLET DAILY.  . SYNTHROID 50 MCG tablet TAKE 1 TABLET EACH DAY.   No current facility-administered medications for this visit.  (Other)      REVIEW OF SYSTEMS: ROS    Positive for: Eyes   Negative for: Constitutional, Gastrointestinal, Neurological, Skin, Genitourinary, Musculoskeletal, HENT, Endocrine, Cardiovascular, Respiratory, Psychiatric, Allergic/Imm, Heme/Lymph   Last edited by Lana FishAtkins, Lisa E on 07/18/2018  8:22 AM. (History)       ALLERGIES Allergies  Allergen Reactions  . Acyclovir And Related Other (See Comments)    unknown  . Pravastatin Sodium Other (See Comments)    cystitis  . Zocor [Simvastatin] Other (See Comments)    cystitis    PAST MEDICAL HISTORY Past Medical History:  Diagnosis Date  . Arthritis    DDD, scoliosis, sees Dr. Lovell SheehanJenkins for this, uses norco very rarely for pain  . Bradycardia 11/11/2017  . CAD (coronary artery disease)    LAD stenting of a 90% lesion 2012  . Cystocele   . Elevated cholesterol   . GERD (gastroesophageal reflux disease)    dx in work up 2016 for atypical CP at OSH   pt. denies  . Heart murmur   . Hypothyroidism   . Interstitial cystitis    sees Dr. Annabell HowellsWrenn  . NSTEMI (non-ST elevated myocardial infarction) (HCC)   .  Osteoporosis   . Rectocele   . 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 Scoliosis   . Thyroid disease    Hypothyroid  . Urinary incontinence    USI  . Uterine prolapse    Past Surgical History:  Procedure Laterality Date  . BLADDER SUSPENSION  2011  . CATARACT EXTRACTION    . CORONARY ANGIOPLASTY WITH STENT PLACEMENT  04/21/2010   LAD 80%, RCA 30%, nl EF, s/p DES LAD  . EYE SURGERY    . LEFT HEART CATH AND CORONARY ANGIOGRAPHY N/A 06/14/2017   Procedure: LEFT HEART CATH AND CORONARY ANGIOGRAPHY;  Surgeon: Runell Gess, MD;  Location: [facility];  Service: Cardiovascular;  Laterality: N/A;  . OOPHORECTOMY  2011   BSO  .  TOTAL HIP ARTHROPLASTY Right 06/10/2017   Procedure: RIGHT TOTAL HIP ARTHROPLASTY ANTERIOR APPROACH;  Surgeon: Samson Frederic, MD;  Location: [facility];  Service: Orthopedics;  Laterality: Right;  Needs RNFA  . VAGINAL HYSTERECTOMY  2011   LAVH BSO    FAMILY HISTORY Family History  Problem Relation Age of Onset  . Hypertension Mother   . Heart disease Mother   . Heart disease Father   . Heart disease Sister   . Colon cancer Paternal Aunt 21  . Breast cancer Paternal Aunt        Age 86's  . Diabetes Sister   . Endometrial cancer Sister   . Breast cancer Cousin        Maternal 1st cousins-Age 81's  . Leukemia Paternal Aunt     SOCIAL HISTORY Social History   Tobacco Use  . Smoking status: Never Smoker  . Smokeless tobacco: Never Used  Substance Use Topics  . Alcohol use: No    Comment: Rare  . Drug use: No         OPHTHALMIC EXAM:  Base Eye Exam    Visual Acuity (Snellen - Linear)      Right Left   Dist [place] 20/30-1 20/40-1   Dist ph  20/25-3 20/30       Tonometry (Tonopen, 8:26 AM)      Right Left   Pressure 16 15       Pupils      Dark Light Shape React APD   Right 3 2 Round Brisk None   Left 3 2 Round Brisk None       Visual Fields      Left Right    Full Full       Extraocular Movement      Right Left    Full, Ortho Full, Ortho       Neuro/Psych    Oriented x3:  Yes   Mood/Affect:  Normal       Dilation    Both eyes:  1.0% Mydriacyl, 2.5% Phenylephrine @ 8:26 AM        Slit Lamp and Fundus Exam    Slit Lamp Exam      Right Left   Lids/Lashes mild Telangiectasia, marginal leasion nasal UL Telangiectasia, Meibomian gland dysfunction   Conjunctiva/Sclera White and quiet White and quiet   Cornea Arcus, 1 Punctate epithelial erosions 1-2+ Punctate epithelial erosions, Arcus   Anterior Chamber Deep and clear Deep and clear   Iris Round and dilated Round and well dilated   Lens PC IOL in good position PC IOL in excellent position    Vitreous Vitreous syneresis, Posterior vitreous detachment Vitreous syneresis, Posterior vitreous detachment       Fundus  Exam      Right Left   Disc Pink and Sharp, mild PPP Pink and Sharp, mild temporal PPA   C/D Ratio 0.4 0.4   Macula Blunted foveal reflex, Drusen, RPE mottling and clumping, early Atrophy, +PEDs,  No heme or edema Flat, Blunted foveal reflex, Drusen, RPE mottling and clumping, focal Atrophy; no heme   Vessels Vascular attenuation Vascular attenuation   Periphery Attached, scattered reticular degeneration   Attached, scattered reticular degeneration          IMAGING AND PROCEDURES  Imaging and Procedures for @TODAY @  OCT, Retina - OU - Both Eyes       Right Eye Quality was good. Central Foveal Thickness: 282. Progression has been stable. Findings include abnormal foveal contour, retinal drusen , pigment epithelial detachment, outer retinal atrophy, intraretinal hyper-reflective material (Focal area of IRHM overlying temporal PED).   Left Eye Quality was good. Central Foveal Thickness: 293. Progression has been stable. Findings include abnormal foveal contour, retinal drusen , no SRF, intraretinal fluid, outer retinal atrophy, pigment epithelial detachment, intraretinal hyper-reflective material (Cystic changes in [facility] overlying PED inferior to fovea).   Notes *Images captured and stored on drive  Diagnosis / Impression:  nonexudative ARMD OU   Clinical management:  See below  Abbreviations: NFP - Normal foveal profile. CME - cystoid macular edema. PED - pigment epithelial detachment. IRF - intraretinal fluid. SRF - subretinal fluid. EZ - ellipsoid zone. ERM - epiretinal membrane. ORA - outer retinal atrophy. ORT - outer retinal tubulation. SRHM - subretinal hyper-reflective material        Fluorescein Angiography Optos (Transit OD)       Right Eye   Progression has no prior data. Early phase findings include staining. Mid/Late phase findings  include staining.   Left Eye   Progression has no prior data. Early phase findings include staining. Mid/Late phase findings include staining.   Notes Images stored on drive;   Impression: +temporal staining No active/leaking CNVM                   ASSESSMENT/PLAN:    ICD-10-CM   1. Intermediate stage nonexudative age-related macular degeneration of both eyes H35.3132 Fluorescein Angiography Optos (Transit OD)  2. Retinal edema H35.81 OCT, Retina - OU - Both Eyes  3. Essential hypertension I10   4. Hypertensive retinopathy of both eyes H35.033 Fluorescein Angiography Optos (Transit OD)  5. Pseudophakia of both eyes Z96.1     1,2. Age related macular degeneration, non-exudative, both eyes  - pt initially presented on 11.27.19 due to alert from [employer] home monitoring system for OD, but was unable to stay for fluorescein angiogram at that time  - here today for FA  - [facility] prescribed by Dr. Elmer Picker  - pt denies any subjective change in vision  - no heme or obvious CNVM on exam  - OCT shows PEDs with tr cystic changes  - FA today (12.16.19) shows staining / window defect corresponding temporal RPE changes OU -- no active CNV OU  - The incidence, anatomy, and pathology of dry AMD, risk of progression, and the AREDS and AREDS 2 study including smoking risks discussed with patient.  - Recommend amsler grid monitoring  - f/u 6 months DFE, OCT, sooner PRN  3. Hypertensive retinopathy OU - discussed importance of tight BP control - monitor  4. Pseudophakia OU  - s/p CE/IOL OU  - beautiful surgeries, doing well  - monitor   Ophthalmic Meds Ordered this visit:  No orders of the defined types were placed in this encounter.      Return in about 6 months (around 01/17/2019) for non-exu ARMD OU, DFE, OCT.  There are no Patient Instructions on file for this visit.   Explained the diagnoses, plan, and follow up with the patient and they expressed understanding.   Patient expressed understanding of the importance of proper follow up care.   This document serves as a record of services personally performed by Karie Chimera, MD, PhD. It was created on their behalf by Laurian Brim, OA, an ophthalmic assistant. The creation of this record is the provider's dictation and/or activities during the visit.    Electronically signed by: Laurian Brim, OA  12.13.19 2:48 PM   Karie Chimera, M.D., Ph.D. Diseases & Surgery of the Retina and Vitreous [facility]  I have reviewed the above documentation for accuracy and completeness, and I agree with the above. Karie Chimera, M.D., Ph.D. 07/18/18 2:48 PM    Abbreviations: M myopia (nearsighted); A astigmatism; H hyperopia (farsighted); P presbyopia; Mrx spectacle prescription;  CTL contact lenses; OD right eye; OS left eye; OU both eyes  XT exotropia; ET esotropia; PEK punctate epithelial keratitis; PEE punctate epithelial erosions; DES dry eye syndrome; MGD meibomian gland dysfunction; ATs artificial tears; PFAT's preservative free artificial tears; NSC nuclear sclerotic cataract; PSC posterior subcapsular cataract; ERM epi-retinal membrane; PVD posterior vitreous detachment; RD retinal detachment; DM diabetes mellitus; DR diabetic retinopathy; NPDR non-proliferative diabetic retinopathy; PDR proliferative diabetic retinopathy; CSME clinically significant macular edema; DME diabetic macular edema; dbh dot blot hemorrhages; CWS cotton wool spot; POAG primary open angle glaucoma; C/D cup-to-disc ratio; HVF humphrey visual field; GVF goldmann visual field; OCT optical coherence tomography; IOP intraocular pressure; BRVO Branch retinal vein occlusion; CRVO central retinal vein occlusion; CRAO central retinal artery occlusion; BRAO branch retinal artery occlusion; RT retinal tear; SB scleral buckle; PPV pars plana vitrectomy; VH Vitreous hemorrhage; PRP panretinal laser photocoagulation; IVK intravitreal  kenalog; VMT vitreomacular traction; MH Macular hole;  NVD neovascularization of the disc; NVE neovascularization elsewhere; AREDS age related eye disease study; ARMD age related macular degeneration; POAG primary open angle glaucoma; EBMD epithelial/anterior basement membrane dystrophy; ACIOL anterior chamber intraocular lens; IOL intraocular lens; PCIOL posterior chamber intraocular lens; Phaco/IOL phacoemulsification with intraocular lens placement; PRK photorefractive keratectomy; LASIK laser assisted in situ keratomileusis; HTN hypertension; DM diabetes mellitus; COPD chronic obstructive pulmonary disease

## 2018-07-18 ENCOUNTER — Ambulatory Visit (INDEPENDENT_AMBULATORY_CARE_PROVIDER_SITE_OTHER): Payer: Medicare Other | Admitting: Ophthalmology

## 2018-07-18 ENCOUNTER — Encounter (INDEPENDENT_AMBULATORY_CARE_PROVIDER_SITE_OTHER): Payer: Self-pay | Admitting: Ophthalmology

## 2018-07-18 DIAGNOSIS — I10 Essential (primary) hypertension: Secondary | ICD-10-CM

## 2018-07-18 DIAGNOSIS — Z961 Presence of intraocular lens: Secondary | ICD-10-CM

## 2018-07-18 DIAGNOSIS — H35033 Hypertensive retinopathy, bilateral: Secondary | ICD-10-CM

## 2018-07-18 DIAGNOSIS — H353132 Nonexudative age-related macular degeneration, bilateral, intermediate dry stage: Secondary | ICD-10-CM

## 2018-07-18 DIAGNOSIS — H3581 Retinal edema: Secondary | ICD-10-CM | POA: Diagnosis not present

## 2018-07-21 ENCOUNTER — Other Ambulatory Visit: Payer: Medicare Other

## 2018-08-17 ENCOUNTER — Telehealth: Payer: Self-pay | Admitting: Family Medicine

## 2018-08-17 ENCOUNTER — Other Ambulatory Visit (INDEPENDENT_AMBULATORY_CARE_PROVIDER_SITE_OTHER): Payer: Medicare Other

## 2018-08-17 DIAGNOSIS — E785 Hyperlipidemia, unspecified: Secondary | ICD-10-CM | POA: Diagnosis not present

## 2018-08-17 LAB — HEPATIC FUNCTION PANEL
ALK PHOS: 86 U/L (ref 39–117)
ALT: 13 U/L (ref 0–35)
AST: 15 U/L (ref 0–37)
Albumin: 4 g/dL (ref 3.5–5.2)
BILIRUBIN DIRECT: 0.1 mg/dL (ref 0.0–0.3)
BILIRUBIN TOTAL: 0.4 mg/dL (ref 0.2–1.2)
TOTAL PROTEIN: 6.3 g/dL (ref 6.0–8.3)

## 2018-08-17 LAB — LIPID PANEL
CHOL/HDL RATIO: 4
Cholesterol: 195 mg/dL (ref 0–200)
HDL: 54 mg/dL (ref >39.00)
LDL CALC: 112 mg/dL — AB (ref 0–99)
NONHDL: 140.6
TRIGLYCERIDES: 141 mg/dL (ref 0.0–149.0)
VLDL: 28.2 mg/dL (ref 0.0–40.0)

## 2018-08-17 NOTE — Telephone Encounter (Signed)
Patient is asking if she should have a bone density done when she goes for her mammogram.  She states after hip surgery she no longer had to get Prolia injections.

## 2018-08-17 NOTE — Telephone Encounter (Signed)
Would recommend that she contact Dr. Charlean Sanfilippo office for recs, as I am not sure what they advised her moving forward. Thank you.

## 2018-08-18 NOTE — Telephone Encounter (Signed)
I called the pt and informed her of the message below

## 2018-08-25 ENCOUNTER — Ambulatory Visit: Payer: Medicare Other | Admitting: Family Medicine

## 2018-08-31 ENCOUNTER — Ambulatory Visit: Payer: Medicare Other | Admitting: Family Medicine

## 2018-08-31 ENCOUNTER — Other Ambulatory Visit: Payer: Self-pay | Admitting: *Deleted

## 2018-08-31 VITALS — BP 172/80 | HR 66 | Temp 97.8°F | Wt 131.5 lb

## 2018-08-31 DIAGNOSIS — R300 Dysuria: Secondary | ICD-10-CM

## 2018-08-31 DIAGNOSIS — E039 Hypothyroidism, unspecified: Secondary | ICD-10-CM | POA: Diagnosis not present

## 2018-08-31 DIAGNOSIS — I10 Essential (primary) hypertension: Secondary | ICD-10-CM

## 2018-08-31 DIAGNOSIS — E785 Hyperlipidemia, unspecified: Secondary | ICD-10-CM | POA: Diagnosis not present

## 2018-08-31 LAB — CBC WITH DIFFERENTIAL/PLATELET
BASOS ABS: 0 10*3/uL (ref 0.0–0.1)
Basophils Relative: 0.8 % (ref 0.0–3.0)
EOS PCT: 2.2 % (ref 0.0–5.0)
Eosinophils Absolute: 0.1 10*3/uL (ref 0.0–0.7)
HEMATOCRIT: 39.6 % (ref 36.0–46.0)
HEMOGLOBIN: 13.3 g/dL (ref 12.0–15.0)
LYMPHS PCT: 23.1 % (ref 12.0–46.0)
Lymphs Abs: 1 10*3/uL (ref 0.7–4.0)
MCHC: 33.7 g/dL (ref 30.0–36.0)
MCV: 90.1 fl (ref 78.0–100.0)
MONOS PCT: 8.7 % (ref 3.0–12.0)
Monocytes Absolute: 0.4 10*3/uL (ref 0.1–1.0)
NEUTROS PCT: 65.2 % (ref 43.0–77.0)
Neutro Abs: 2.7 10*3/uL (ref 1.4–7.7)
Platelets: 265 10*3/uL (ref 150.0–400.0)
RBC: 4.39 Mil/uL (ref 3.87–5.11)
RDW: 13.8 % (ref 11.5–15.5)
WBC: 4.1 10*3/uL (ref 4.0–10.5)

## 2018-08-31 LAB — TSH: TSH: 1.75 u[IU]/mL (ref 0.35–4.50)

## 2018-08-31 LAB — BASIC METABOLIC PANEL
BUN: 17 mg/dL (ref 6–23)
CO2: 29 mEq/L (ref 19–32)
Calcium: 9.3 mg/dL (ref 8.4–10.5)
Chloride: 104 mEq/L (ref 96–112)
Creatinine, Ser: 0.62 mg/dL (ref 0.40–1.20)
GFR: 92.28 mL/min (ref >60.00)
Glucose, Bld: 89 mg/dL (ref 70–99)
POTASSIUM: 4.4 meq/L (ref 3.5–5.1)
SODIUM: 140 meq/L (ref 135–145)

## 2018-08-31 LAB — POCT URINALYSIS DIPSTICK
BILIRUBIN UA: NEGATIVE
Glucose, UA: NEGATIVE
Ketones, UA: NEGATIVE
Leukocytes, UA: NEGATIVE
Nitrite, UA: NEGATIVE
PH UA: 5 (ref 5.0–8.0)
PROTEIN UA: NEGATIVE
RBC UA: NEGATIVE
Spec Grav, UA: >=1.03 — AB (ref 1.010–1.025)
Urobilinogen, UA: 0.2 E.U./dL

## 2018-08-31 MED ORDER — NITROGLYCERIN 0.4 MG SL SUBL: 0.4000 mg | SUBLINGUAL_TABLET | SUBLINGUAL | 8 refills | Status: DC | PRN

## 2018-08-31 NOTE — Progress Notes (Signed)
Kaitlyn Parks DOB: 04-14-1935 Encounter date: 08/31/2018  This is a 84 y.o. female who presents with Chief Complaint  Patient presents with  . Blood Pressure Check    bp was 195/95 last night, dysuria, urinary frequency, took azo monday,    History of present illness:  Blood pressure was a little high Monday night. Sister is very ill - has bacterial infection/needing bladder stenting. Then broke hip yesterday. Was anxious/upset about this. Has also had higher sodium foods than normal (frozen fish with frozen potatoes). Has been good about taking her medication.   Takes the hydralazine BID.   Some dysuria yesterday morning. Took 1 azo tablet. Has hx of some recurrent UTI. She also has some cystitis flares. BP does increase with bladder infections as well.   Can feel when bp is high - slight buzz/dizzy feeling. Was up to 195/95 with auto cuff; nurse neighbor rechecked and got 185/90. Gradually came down after taking hydralazine. No chest pain, no chest pressure, no heart racing.   Last elevation of bp was due to pain in jaw after hip replacement (ended up needing catheterization and some CAD noted). Not had any since.   Did also take aleve early Tuesday morning.    Takes the hydralazine at 8am and then again at 6pm.   Allergies  Allergen Reactions  . Acyclovir And Related Other (See Comments)    unknown  . Pravastatin Sodium Other (See Comments)    cystitis  . Zocor [Simvastatin] Other (See Comments)    cystitis   No outpatient medications have been marked as taking for the 08/31/18 encounter (Office Visit) with Wynn Banker, MD.    Review of Systems  Constitutional: Negative for chills, fatigue and fever.  Respiratory: Negative for cough, chest tightness, shortness of breath and wheezing.   Cardiovascular: Negative for chest pain, palpitations and leg swelling.  Neurological: Negative for dizziness (not dizzy, but head feels funny when bp is so significantly elevated)  and headaches.    Objective:  BP (!) 172/80   Pulse 66   Temp 97.8 F (36.6 C) (Oral)   Wt 131 lb 8 oz (59.6 kg)   SpO2 97%   BMI 22.57 kg/m   Weight: 131 lb 8 oz (59.6 kg)   BP Readings from Last 3 Encounters:  08/31/18 (!) 172/80  07/07/18 120/62  03/15/18 112/62   Wt Readings from Last 3 Encounters:  08/31/18 131 lb 8 oz (59.6 kg)  07/07/18 126 lb 14.4 oz (57.6 kg)  03/15/18 129 lb 12.8 oz (58.9 kg)    Physical Exam Constitutional:      General: She is not in acute distress.    Appearance: She is well-developed.  Cardiovascular:     Rate and Rhythm: Normal rate and regular rhythm.     Heart sounds: Normal heart sounds. No murmur. No friction rub.  Pulmonary:     Effort: Pulmonary effort is normal. No respiratory distress.     Breath sounds: Normal breath sounds. No wheezing or rales.  Musculoskeletal:     Right lower leg: No edema.     Left lower leg: No edema.  Neurological:     Mental Status: She is alert and oriented to person, place, and time.  Psychiatric:        Behavior: Behavior normal.     Assessment/Plan: 1. Essential hypertension Pressure has improved somewhat in the office with comparison to her home readings.  I did advise that she can use hydralazine 3 times a  day to help control blood pressures.  I do think there is a significant component of stress and high sodium intake that contributed to this elevation of her blood pressure.  She has no concerning symptoms from high blood pressure at present.  I have encouraged her to monitor at home and to take the afternoon dose of hydralazine only if blood pressure is elevated over 160/85.  Certainly call with any concerns.  When she had previous blood work done all of the labs ordered from Dr. Selena Batten are not completed, so I am sending her back to the lab today to have these completed.  We will call her once we get urine culture results to check in and see how blood pressures are running.   - Basic Metabolic  Panel - CBC with Differential/Platelet  2. Dysuria Patient reassured that urinalysis in the office does look good today.  We will send urine for culture however given her symptoms and history of infection. - POCT urinalysis dipstick - Urine Culture  3. Dyslipidemia Continue current medication.  4. Hypothyroidism, unspecified type - TSH    Return pending verbal update Friday.     Theodis Shove, MD

## 2018-08-31 NOTE — Patient Instructions (Addendum)
Please ask lab to complete bloodwork ordered by Dr. Selena Batten from 07/07/2018.   OK to take afternoon dose of hydralazine if blood pressure is 160/85 or higher.

## 2018-09-01 LAB — URINE CULTURE
MICRO NUMBER:: 122565
SPECIMEN QUALITY: ADEQUATE

## 2018-09-05 ENCOUNTER — Telehealth: Payer: Self-pay | Admitting: Family Medicine

## 2018-09-05 NOTE — Telephone Encounter (Signed)
Patient wanted the doctor to know that her BP has been running from the highs of 170 to the lows of 144 with the new medication.

## 2018-09-05 NOTE — Telephone Encounter (Signed)
Please advise 

## 2018-09-05 NOTE — Telephone Encounter (Signed)
I called the pt and informed her of the message below.  She agreed to call back for an appt within the week if needed.

## 2018-09-05 NOTE — Telephone Encounter (Signed)
I would not change meds tonight.  If she continues to have consistent systolic BP > 160 would make sure she has follow up with primary within one week to reassess.

## 2018-09-06 ENCOUNTER — Telehealth: Payer: Self-pay | Admitting: *Deleted

## 2018-09-06 NOTE — Telephone Encounter (Signed)
I called the pt and she stated her BP was 151/68 this am and she took 1 Hydralazine tablet.  She stated later it was 159/78 and 176/90 yesterday and she questioned if she should come in for an appt.  Dr Selena Batten was informed of this and stated if the pt was feeling sick she should be seen today, otherwise she could wait to come in on Thursday. Patient stated she is feeling OK, will take an additional tablet if needed as instructed by Dr Hassan Rowan.  Appt scheduled for Thursday and the pt was advised to call back for an appt tomorrow if needed.

## 2018-09-06 NOTE — Telephone Encounter (Signed)
Copied from CRM [id number]. Topic: General - Other >> Sep 06, 2018 10:12 AM Marylen Ponto wrote: Reason for CRM: Pt requests a call back from [place]. Cb# [phone]

## 2018-09-08 ENCOUNTER — Ambulatory Visit: Payer: Medicare Other | Admitting: Family Medicine

## 2018-09-08 ENCOUNTER — Encounter: Payer: Self-pay | Admitting: Family Medicine

## 2018-09-08 VITALS — BP 124/60 | HR 64 | Temp 97.8°F | Ht 64.0 in | Wt 130.2 lb

## 2018-09-08 DIAGNOSIS — I10 Essential (primary) hypertension: Secondary | ICD-10-CM | POA: Diagnosis not present

## 2018-09-08 NOTE — Progress Notes (Signed)
HPI:  Using dictation device. Unfortunately this device frequently misinterprets words/phrases.  Follow-up hypertension: -reports BP had been running up at home, now improving, uses hydralazine prn - asymptomatic with this, with no reported cp, sob, ha, etc -she has been under a bit of stress, still dealing with husband's estate, family members with health issues and recent loss in the family -Medications include Imdur, hydralazine -prescribed by her cardiologist -She has had difficulty tolerating a number of medications, she sees cardiology for management of her blood pressure history of hyperlipidemia and coronary artery disease -Per cardiology note she had symptomatic bradycardia with a beta-blocker -she does not want to take a diuretic   ROS: See pertinent positives and negatives per HPI.  Past Medical History:  Diagnosis Date  . Arthritis    DDD, scoliosis, sees Dr. Lovell Sheehan for this, uses norco very rarely for pain  . Bradycardia 11/11/2017  . CAD (coronary artery disease)    LAD stenting of a 90% lesion 2012  . Cystocele   . Elevated cholesterol   . GERD (gastroesophageal reflux disease)    dx in work up 2016 for atypical CP at OSH   pt. denies  . Heart murmur   . Hypothyroidism   . Interstitial cystitis    sees Dr. [name]  . NSTEMI (non-ST elevated myocardial infarction) (HCC)   . Osteoporosis   . Rectocele   . 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 Scoliosis   . Thyroid disease    Hypothyroid  . Urinary incontinence    USI  . Uterine prolapse     Past Surgical History:  Procedure Laterality Date  . BLADDER SUSPENSION  2011  . CATARACT EXTRACTION    . CORONARY ANGIOPLASTY WITH STENT PLACEMENT  04/21/2010   LAD 80%, RCA 30%, nl EF, s/p DES LAD  . EYE SURGERY    . LEFT HEART CATH AND CORONARY ANGIOGRAPHY N/A 06/14/2017   Procedure: LEFT HEART CATH AND CORONARY ANGIOGRAPHY;  Surgeon: Runell Gess, MD;  Location: [facility];  Service:  Cardiovascular;  Laterality: N/A;  . OOPHORECTOMY  2011   BSO  . TOTAL HIP ARTHROPLASTY Right 06/10/2017   Procedure: RIGHT TOTAL HIP ARTHROPLASTY ANTERIOR APPROACH;  Surgeon: Samson Frederic, MD;  Location: [facility];  Service: Orthopedics;  Laterality: Right;  Needs RNFA  . VAGINAL HYSTERECTOMY  2011   [facility] BSO    Family History  Problem Relation Age of Onset  . Hypertension Mother   . Heart disease Mother   . Heart disease Father   . Heart disease Sister   . Colon cancer Paternal Aunt 2  . Breast cancer Paternal Aunt        Age 44's  . Diabetes Sister   . Endometrial cancer Sister   . Breast cancer Cousin        Maternal 1st cousins-Age 43's  . Leukemia Paternal Aunt     SOCIAL HX: see hpi   Current Outpatient Medications:  .  aspirin 81 MG chewable tablet, Chew 1 tablet (81 mg total) 2 (two) times daily by mouth., Disp: 60 tablet, Rfl: 1 .  Biotin w/ Vitamins C & E (HAIR/SKIN/NAILS PO), Take 1 tablet by mouth daily., Disp: , Rfl:  .  hydrALAZINE (APRESOLINE) 10 MG tablet, Take 1 tablet (10 mg total) by mouth 2 (two) times daily as needed., Disp: 60 tablet, Rfl: 11 .  isosorbide mononitrate (IMDUR) 60 MG 24 hr tablet, Take 1 tablet (60 mg total) daily by mouth., Disp: 30  tablet, Rfl: 11 .  Multiple Vitamins-Calcium (ONE-A-DAY WOMENS PO), Take 1 tablet by mouth daily. , Disp: , Rfl:  .  nitroGLYCERIN (NITROSTAT) 0.4 MG SL tablet, Place 1 tablet (0.4 mg total) under the tongue every 5 (five) minutes as needed for chest pain., Disp: 25 tablet, Rfl: 8 .  rosuvastatin (CRESTOR) 10 MG tablet, TAKE (1/2) TABLET DAILY., Disp: 15 tablet, Rfl: 2 .  SYNTHROID 50 MCG tablet, TAKE 1 TABLET EACH DAY., Disp: 90 tablet, Rfl: 1  EXAM:  Vitals:   09/08/18 1359  BP: 124/60  Pulse: 64  Temp: 97.8 F (36.6 C)    Body mass index is 22.35 kg/m.  GENERAL: vitals reviewed and listed above, alert, oriented, appears well hydrated and in no acute distress  HEENT: atraumatic, conjunttiva  clear, no obvious abnormalities on inspection of external nose and ears  NECK: no obvious masses on inspection  LUNGS: clear to auscultation bilaterally, no wheezes, rales or rhonchi, good air movement  CV: HRRR, no peripheral edema  MS: moves all extremities without noticeable abnormality  PSYCH: pleasant and cooperative, no obvious depression or anxiety  ASSESSMENT AND PLAN:  Discussed the following assessment and plan:  Essential hypertension  -her blood pressure looks quite good today -discussed goals and would like to check her cuff  -discussed various blood pressure medications/risks/options -she has decided to continue with current regiment today, consider adding arb or acei -advised follow up with me or her cardiologist in 1-2 months and to bring cuff and log, she can notify us in the interim if running high or concerns -advised of breast cancer screening due  Patient Instructions  Please follow up with your cardiologist or here in 1-2 months for a recheck. Your last cardiology visit was in May.  Bring your cuff and log with you.  Continue to use the hydralazine as needed.  Follow up sooner if any concerns.   Kaitlyn Parks, Kaitlyn Parks

## 2018-09-08 NOTE — Patient Instructions (Addendum)
Please follow up with your cardiologist or here in 1-2 months for a recheck. Your last cardiology visit was in May.  Bring your cuff and log with you.  Continue to use the hydralazine as needed.  Follow up sooner if any concerns.

## 2018-09-20 ENCOUNTER — Ambulatory Visit (INDEPENDENT_AMBULATORY_CARE_PROVIDER_SITE_OTHER): Payer: Medicare Other | Admitting: *Deleted

## 2018-09-20 VITALS — BP 160/70 | HR 70 | Wt 133.2 lb

## 2018-09-20 DIAGNOSIS — I10 Essential (primary) hypertension: Secondary | ICD-10-CM | POA: Diagnosis not present

## 2018-09-20 MED ORDER — HYDRALAZINE HCL 25 MG PO TABS: 25.0000 mg | ORAL_TABLET | Freq: Three times a day (TID) | ORAL | 6 refills | Status: DC

## 2018-09-20 NOTE — Patient Instructions (Signed)
1.) Reason for visit: walk-in blood pressure check  2.) Name of MD requesting visit: hochrein  3.) H&P: hx of elevated blood pressure  4.) ROS related to problem: patient came into the office today to have her home bp monitor correlated. bp by me was 160/70, first bp by patient with arm hanging down was 178/90. Recheck with arm elevated to heart level was 159/90. Patient has been taking hydralazine 10 mg twice daily for the last 4-5 months and three times daily as needed for bp > 160. She reports having to take a third hydralazine 2 out of the 5 days of the week. She is very active and causes for elevated bp discussed.  5.) Assessment and plan per MD: discussed with dr hochrein, hydralazine increased to 25 mg three times daily. She has a follow up with the APP end of this month and dr hochrein in march. She will continue to monitor her bp and let us know how things are going. New script sent to the pharmacy

## 2018-09-23 DIAGNOSIS — Z961 Presence of intraocular lens: Secondary | ICD-10-CM | POA: Insufficient documentation

## 2018-09-23 DIAGNOSIS — B301 Conjunctivitis due to adenovirus: Secondary | ICD-10-CM | POA: Insufficient documentation

## 2018-09-27 ENCOUNTER — Ambulatory Visit: Payer: Medicare Other | Admitting: Adult Health

## 2018-09-28 LAB — HM DEXA SCAN

## 2018-09-28 LAB — HM MAMMOGRAPHY

## 2018-10-05 ENCOUNTER — Encounter: Payer: Self-pay | Admitting: Family Medicine

## 2018-10-05 ENCOUNTER — Telehealth: Payer: Self-pay | Admitting: *Deleted

## 2018-10-05 NOTE — Telephone Encounter (Signed)
Copied from CRM [id number]. Topic: General - Call Back - No Documentation >> Oct 05, 2018  9:41 AM Lorrine Kin, NT wrote: Reason for CRM: Patient states that she is returning a call to Marvia Pickles. Please advise.

## 2018-10-05 NOTE — Telephone Encounter (Signed)
I called the pt and informed her of the bone density results (see report).  Patient stated she will call the endo office for an appt.

## 2018-10-06 ENCOUNTER — Encounter: Payer: Self-pay | Admitting: Family Medicine

## 2018-10-09 ENCOUNTER — Emergency Department (HOSPITAL_COMMUNITY)
Admission: EM | Admit: 2018-10-09 | Discharge: 2018-10-09 | Disposition: A | Discharge status: Home / Self Care | Payer: Medicare Other | Attending: Emergency Medicine | Admitting: Emergency Medicine

## 2018-10-09 ENCOUNTER — Emergency Department (HOSPITAL_COMMUNITY): Payer: Medicare Other

## 2018-10-09 ENCOUNTER — Encounter (HOSPITAL_COMMUNITY): Payer: Self-pay | Admitting: Emergency Medicine

## 2018-10-09 DIAGNOSIS — I2510 Atherosclerotic heart disease of native coronary artery without angina pectoris: Secondary | ICD-10-CM | POA: Diagnosis not present

## 2018-10-09 DIAGNOSIS — M546 Pain in thoracic spine: Secondary | ICD-10-CM | POA: Diagnosis present

## 2018-10-09 DIAGNOSIS — Z79899 Other long term (current) drug therapy: Secondary | ICD-10-CM | POA: Diagnosis not present

## 2018-10-09 DIAGNOSIS — E039 Hypothyroidism, unspecified: Secondary | ICD-10-CM | POA: Diagnosis not present

## 2018-10-09 DIAGNOSIS — Z7982 Long term (current) use of aspirin: Secondary | ICD-10-CM | POA: Insufficient documentation

## 2018-10-09 DIAGNOSIS — I252 Old myocardial infarction: Secondary | ICD-10-CM | POA: Insufficient documentation

## 2018-10-09 DIAGNOSIS — R42 Dizziness and giddiness: Secondary | ICD-10-CM | POA: Diagnosis not present

## 2018-10-09 DIAGNOSIS — E7800 Pure hypercholesterolemia, unspecified: Secondary | ICD-10-CM | POA: Insufficient documentation

## 2018-10-09 LAB — URINALYSIS, ROUTINE W REFLEX MICROSCOPIC
Bilirubin Urine: NEGATIVE
Glucose, UA: NEGATIVE mg/dL
Hgb urine dipstick: NEGATIVE
Ketones, ur: NEGATIVE mg/dL
Leukocytes,Ua: NEGATIVE
NITRITE: NEGATIVE
Protein, ur: NEGATIVE mg/dL
Specific Gravity, Urine: 1.012 (ref 1.005–1.030)
pH: 7 (ref 5.0–8.0)

## 2018-10-09 LAB — BASIC METABOLIC PANEL
ANION GAP: 8 (ref 5–15)
BUN: 21 mg/dL (ref 8–23)
CALCIUM: 9.4 mg/dL (ref 8.9–10.3)
CHLORIDE: 108 mmol/L (ref 98–111)
CO2: 23 mmol/L (ref 22–32)
Creatinine, Ser: 0.7 mg/dL (ref 0.44–1.00)
GFR calc Af Amer: >60 mL/min (ref >60)
GFR calc non Af Amer: >60 mL/min (ref >60)
GLUCOSE: 115 mg/dL — AB (ref 70–99)
Potassium: 3.5 mmol/L (ref 3.5–5.1)
Sodium: 139 mmol/L (ref 135–145)

## 2018-10-09 LAB — CBC
HEMATOCRIT: 42.5 % (ref 36.0–46.0)
Hemoglobin: 13.4 g/dL (ref 12.0–15.0)
MCH: 29.6 pg (ref 26.0–34.0)
MCHC: 31.5 g/dL (ref 30.0–36.0)
MCV: 93.8 fL (ref 80.0–100.0)
Platelets: 260 10*3/uL (ref 150–400)
RBC: 4.53 MIL/uL (ref 3.87–5.11)
RDW: 12.6 % (ref 11.5–15.5)
WBC: 4.3 10*3/uL (ref 4.0–10.5)
nRBC: 0 % (ref 0.0–0.2)

## 2018-10-09 LAB — TROPONIN I

## 2018-10-09 MED ORDER — SODIUM CHLORIDE 0.9% FLUSH: 3.0000 mL | Freq: Once | INTRAVENOUS | Status: DC

## 2018-10-09 MED ORDER — ACETAMINOPHEN 325 MG PO TABS
325.0000 mg | ORAL_TABLET | Freq: Once | ORAL | Status: AC
  Administered 2018-10-09: 325 mg via ORAL
  Filled 2018-10-09: qty 1

## 2018-10-09 NOTE — ED Triage Notes (Addendum)
Patient reports waking up with back pain and L arm pain around 0200, she states that she took her bp and it was elevated in the 180s-190s, she took her bp medication around 0700 and states sometime afterwards she began feeling dizzy but she cannot pinpoint exactly when. She also reports that she takes imdur and occasionally has dizziness that she thinks is related to this med. She reports she is prescribed 25mg  hydralazaine TID but she states she only takes it BID because she feels too weak otherwise. She reports continued dizziness, speech clear, face symmetrical, no neuro deficits, a/ox4, resp e/u, nad

## 2018-10-09 NOTE — ED Notes (Signed)
PA Ward advised no further orders needed at this time

## 2018-10-09 NOTE — ED Provider Notes (Signed)
[facility] EMERGENCY DEPARTMENT Provider Note   CSN: [id number] Arrival date & time: 10/09/18  4401    History   Chief Complaint Chief Complaint  Patient presents with  . Arm Pain  . Dizziness    HPI Kaitlyn Parks is a 84 y.o. female.     84 y.o female with a PMH of bracycardia, NSTEMI, CAD (LAD stent 2012) presents to the ED with a chief complaint of back pain and left arm pain since last night.  Patient reports waking up last night with some back pain around the thoracic area last night which she describes as sharp.  She reports measured her blood pressure and it was elevated with around 180 systolic and over 100 diastolic.  Reports taking some Aleve to help with her symptoms and going back to bed.  States she woke up at 2 AM again and measured her blood pressure which was around 87/57.  Reports when she woke up this morning around 7 AM to take her blood pressure medication her pressure was low so she did not take this medication.  She reports pain along her back to radiates to her arms.  Patient reports she is in good health and does water exercises twice a week, reports this week she has been feeling more short of breath than usual.  Reports there is been an increase in fatigued and was able to go to water exercise on Monday but on Wednesday she felt more fatigued after water exercise.  Patient does have a previous history of stenting to her LAD around 2012.  Reports her last cath was done in 2018 November which showed 40% blockage around the other vessels unsure of which.  Patient reports she is been trying to get her blood pressure medication adjusted by multiple physicians, reports she has a follow-up appointment in sometime in April with Dr. Antoine Poche.  States she was recently placed on hydralazine 3 times daily but has only taken it twice daily due to her causing increased fatigue.  She denies any previous history of blood clots, chest pain, fever, abdominal pain,  vomiting or other complaints.     Past Medical History:  Diagnosis Date  . Arthritis    DDD, scoliosis, sees Dr. Lovell Sheehan for this, uses norco very rarely for pain  . Bradycardia 11/11/2017  . CAD (coronary artery disease)    LAD stenting of a 90% lesion 2012  . Cystocele   . Elevated cholesterol   . GERD (gastroesophageal reflux disease)    dx in work up 2016 for atypical CP at OSH   pt. denies  . Heart murmur   . Hypothyroidism   . Interstitial cystitis    sees Dr. [name]  . NSTEMI (non-ST elevated myocardial infarction) (HCC)   . Osteoporosis   . Rectocele   . 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 Scoliosis   . Thyroid disease    Hypothyroid  . Urinary incontinence    USI  . Uterine prolapse     Patient Active Problem List   Diagnosis Date Noted  . Conjunctivitis due to Adenovirus 09/23/2018  . Presence of intraocular lens 09/23/2018  . Bradycardia 11/11/2017  . Dyslipidemia 11/11/2017  . Pain in joint of right hip 08/04/2017  . CAD (coronary artery disease) 06/12/2017  . GERD (gastroesophageal reflux disease) 06/12/2017  . 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 Osteoarthritis of right hip 06/10/2017  . Dysuria 02/12/2015  . Sore throat 02/12/2015  . CAD S/P percutaneous  coronary angioplasty 02/11/2015  . Renal insufficiency 02/11/2015  . Prolonged Q-T interval on ECG 04/18/2010  . Hypothyroidism (acquired) 06/08/2007  . MITRAL VALVE PROLAPSE 06/08/2007    Past Surgical History:  Procedure Laterality Date  . BLADDER SUSPENSION  2011  . CATARACT EXTRACTION    . CORONARY ANGIOPLASTY WITH STENT PLACEMENT  04/21/2010   LAD 80%, RCA 30%, nl EF, s/p DES LAD  . EYE SURGERY    . LEFT HEART CATH AND CORONARY ANGIOGRAPHY N/A 06/14/2017   Procedure: LEFT HEART CATH AND CORONARY ANGIOGRAPHY;  Surgeon: Runell Gess, MD;  Location: [facility] INVASIVE CV LAB;  Service: Cardiovascular;  Laterality: N/A;  . OOPHORECTOMY  2011   BSO  . TOTAL HIP ARTHROPLASTY Right  06/10/2017   Procedure: RIGHT TOTAL HIP ARTHROPLASTY ANTERIOR APPROACH;  Surgeon: Samson Frederic, MD;  Location: [facility];  Service: Orthopedics;  Laterality: Right;  Needs RNFA  . VAGINAL HYSTERECTOMY  2011   [facility] BSO     OB History    Gravida  2   Para  2   Term  2   Preterm      AB      Living  2     SAB      TAB      Ectopic      Multiple      Live Births               Home Medications    Prior to Admission medications   Medication Sig Start Date End Date Taking? Authorizing Provider  aspirin 81 MG chewable tablet Chew 1 tablet (81 mg total) 2 (two) times daily by mouth. 06/11/17   Swinteck, Arlys John, MD  Biotin w/ Vitamins C & E (HAIR/SKIN/NAILS PO) Take 1 tablet by mouth daily.    [provider]  hydrALAZINE (APRESOLINE) 25 MG tablet Take 1 tablet (25 mg total) by mouth 3 (three) times daily. 09/20/18   Rollene Rotunda, MD  isosorbide mononitrate (IMDUR) 60 MG 24 hr tablet Take 1 tablet (60 mg total) daily by mouth. 06/15/17   Barrett, Joline Salt, PA-C  Multiple Vitamins-Calcium (ONE-A-DAY WOMENS PO) Take 1 tablet by mouth daily.     [provider]  nitroGLYCERIN (NITROSTAT) 0.4 MG SL tablet Place 1 tablet (0.4 mg total) under the tongue every 5 (five) minutes as needed for chest pain. 08/31/18   Rollene Rotunda, MD  rosuvastatin (CRESTOR) 10 MG tablet TAKE (1/2) TABLET DAILY. 05/19/18   Rollene Rotunda, MD  SYNTHROID 50 MCG tablet TAKE 1 TABLET EACH DAY. 05/19/18   Terressa Koyanagi, DO    Family History Family History  Problem Relation Age of Onset  . Hypertension Mother   . Heart disease Mother   . Heart disease Father   . Heart disease Sister   . Colon cancer Paternal Aunt 60  . Breast cancer Paternal Aunt        Age 72's  . Diabetes Sister   . Endometrial cancer Sister   . Breast cancer Cousin        Maternal 1st cousins-Age 29's  . Leukemia Paternal Aunt     Social History Social History   Tobacco Use  . Smoking status: Never  Smoker  . Smokeless tobacco: Never Used  Substance Use Topics  . Alcohol use: No    Comment: Rare  . Drug use: No     Allergies   Acyclovir and related; Pravastatin sodium; and Zocor [simvastatin]   Review of Systems Review  of Systems  Constitutional: Positive for fatigue. Negative for chills and fever.  HENT: Negative for ear pain and sore throat.   Eyes: Negative for pain and visual disturbance.  Respiratory: Positive for shortness of breath. Negative for cough.   Cardiovascular: Negative for palpitations.  Gastrointestinal: Negative for abdominal pain and vomiting.  Genitourinary: Negative for dysuria and hematuria.  Musculoskeletal: Positive for back pain. Negative for arthralgias.  Skin: Negative for color change and rash.  Neurological: Negative for dizziness, seizures, syncope, facial asymmetry and headaches.  All other systems reviewed and are negative.    Physical Exam Updated Vital Signs BP (!) 146/82   Pulse 70   Resp 20   SpO2 98%   Physical Exam Vitals signs and nursing note reviewed.  Constitutional:      General: She is not in acute distress.    Appearance: She is well-developed.  HENT:     Head: Normocephalic and atraumatic.     Mouth/Throat:     Pharynx: No oropharyngeal exudate.  Eyes:     Pupils: Pupils are equal, round, and reactive to light.  Neck:     Musculoskeletal: Normal range of motion.  Cardiovascular:     Rate and Rhythm: Regular rhythm.     Heart sounds: Normal heart sounds.  Pulmonary:     Effort: Pulmonary effort is normal. No respiratory distress.     Breath sounds: Normal breath sounds.  Abdominal:     General: Bowel sounds are normal. There is no distension.     Palpations: Abdomen is soft.     Tenderness: There is no abdominal tenderness.  Musculoskeletal:        General: No tenderness or deformity.     Right lower leg: No edema.     Left lower leg: No edema.  Skin:    General: Skin is warm and dry.  Neurological:      Mental Status: She is alert and oriented to person, place, and time.      ED Treatments / Results  Labs (all labs ordered are listed, but only abnormal results are displayed) Labs Reviewed  BASIC METABOLIC PANEL - Abnormal; Notable for the following components:      Result Value   Glucose, Bld 115 (*)    All other components within normal limits  URINALYSIS, ROUTINE W REFLEX MICROSCOPIC - Abnormal; Notable for the following components:   APPearance HAZY (*)    All other components within normal limits  CBC  TROPONIN I  CBG MONITORING, ED    EKG EKG Interpretation  Date/Time:  Sunday October 09 2018 09:12:05 EDT Ventricular Rate:  81 PR Interval:  170 QRS Duration: 96 QT Interval:  398 QTC Calculation: 462 R Axis:   86 Text Interpretation:  Normal sinus rhythm Right atrial enlargement Left ventricular hypertrophy with repolarization abnormality Abnormal ekg Confirmed by Gerhard Munch [phone]) on 10/09/2018 11:07:05 AM   Radiology Dg Chest 2 View  Result Date: 10/09/2018 CLINICAL DATA:  Shortness of breath EXAM: CHEST - 2 VIEW COMPARISON:  12/09/2017 FINDINGS: Stable heart size and vascularity. Mild hyperinflation, suspect COPD/emphysema. No focal pneumonia, collapse or consolidation. Negative for edema, effusion or pneumothorax. Trachea midline. Aorta atherosclerotic. Scoliosis of the spine noted. Monitor leads overlie the chest. IMPRESSION: Stable hyperinflation. No interval change or superimposed acute process. Aortic atherosclerosis Electronically Signed   By: Judie Petit.  Shick M.D.   On: 10/09/2018 12:37    Procedures Procedures (including critical care time)  Medications Ordered in ED Medications  sodium chloride  flush (NS) 0.9 % injection 3 mL (3 mLs Intravenous Not Given 10/09/18 1225)  acetaminophen (TYLENOL) tablet 325 mg (325 mg Oral Given 10/09/18 1311)     Initial Impression / Assessment and Plan / ED Course  I have reviewed the triage vital signs and the nursing  notes.  Pertinent labs & imaging results that were available during my care of the patient were reviewed by me and considered in my medical decision making (see chart for details).       Patient with a previous history of cardiac stent to her LAD 90% along with back pain along with elevated blood pressure at home.  Patient reports measuring her blood pressure several times and getting some changes in the readings.  During evaluation patient is well-appearing, in no distress.  BMP showed no electrolyte normality, creatinine is within normal limits.  See showed no leukocytosis, hemoglobin is within normal limits.  First troponin was negative.  Patient does have good follow-up reports she is currently getting her medications adjusted for blood pressure, she was originally told to take hydralazine 3 times daily, she is currently taking this medication twice daily. Chest xray showed: Stable hyperinflation. No interval change or superimposed acute  process.     Patient has had blood pressures in between 150s systolic, 80s diastolic.  She reports she had a hard time managing her blood pressure with the changes in medication she has been having.  Patient denies any chest pain at this time reports is mainly back pain.  Per her chart she has been evaluated for pulmonary embolisms in the past with all of them being negative.   3:15 PM I have discussed patient with Dr. Jeraldine Loots who has also seen and evaluated patient, will have patient follow up with cardiology at her schedule appointment on November 15, 2018.    Final Clinical Impressions(s) / ED Diagnoses   Final diagnoses:  Dizziness    ED Discharge Orders    None       Claude Manges, Cordelia Poche 10/09/18 1517    Gerhard Munch, MD 10/09/18 [phone]

## 2018-10-09 NOTE — ED Notes (Signed)
Patient verbalizes understanding of discharge instructions. Opportunity for questioning and answers were provided. Armband removed by staff, pt discharged from ED ambulatory to home.  

## 2018-10-09 NOTE — Discharge Instructions (Signed)
All your laboratory results were within normal limits.  Please follow-up with your primary care physician as needed.  Please follow-up for your schedule appointment with cardiology to further manage your blood pressure.

## 2018-10-09 NOTE — ED Notes (Signed)
PA Ward made aware of patient and in to assess her at this time

## 2018-10-15 IMAGING — CT CT ANGIO CHEST
2 of 6 series · 18 of 36 positions shown · IV contrast (Omni 300)
Comparison: Chest radiograph June 12, 2017 at 0559 hours

CLINICAL DATA: Acute onset RIGHT jaw and neck pain, nausea at 7497
hours. History of hip surgery this than 1 week ago, scoliosis.

EXAM:
CT ANGIOGRAPHY CHEST WITH CONTRAST
TECHNIQUE: Multidetector CT imaging of the chest was performed using the
standard protocol during bolus administration of intravenous
contrast. Multiplanar CT image reconstructions and MIPs were
obtained to evaluate the vascular anatomy.
CONTRAST:  100mL PLT6M8-STP IOPAMIDOL (PLT6M8-STP) INJECTION 76%

[Series 7: pe thins · axial · 0.62mm/px · z∈[+1160,+1406]mm · 17 of 278 slices shown]
[im 16/278  lung]
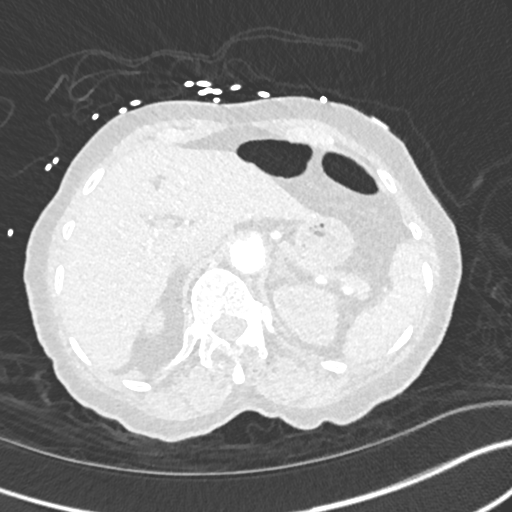
[im 31/278  mediastinal]
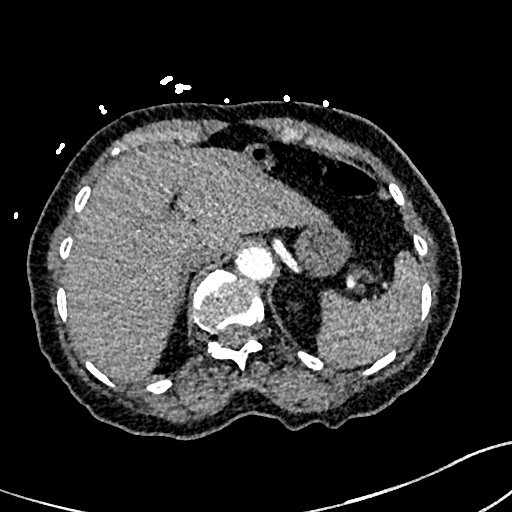
[im 47/278  lung]
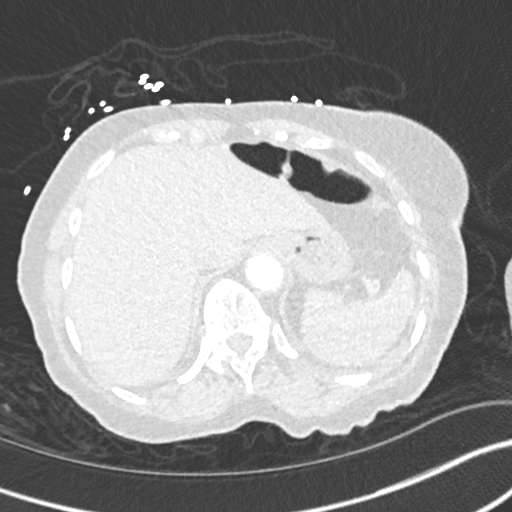
[im 62/278  mediastinal]
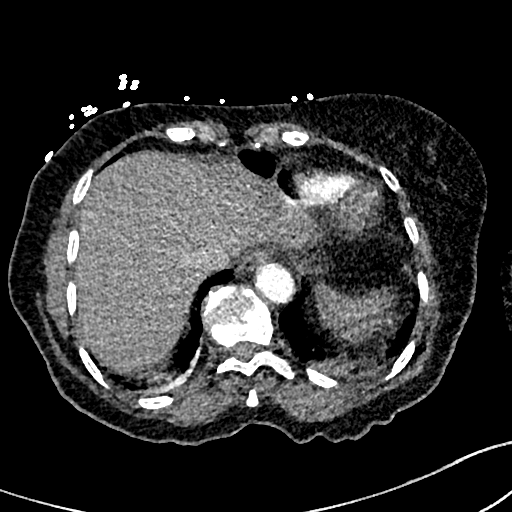
[im 77/278  lung]
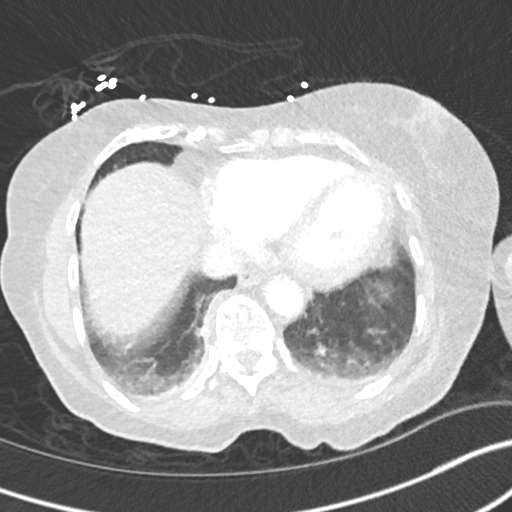
[im 93/278  mediastinal]
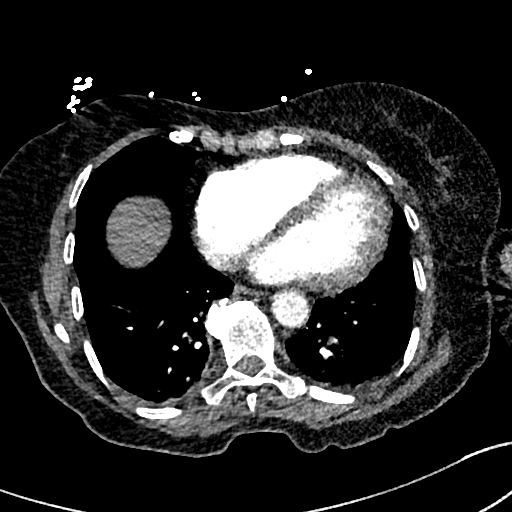
[im 108/278  lung]
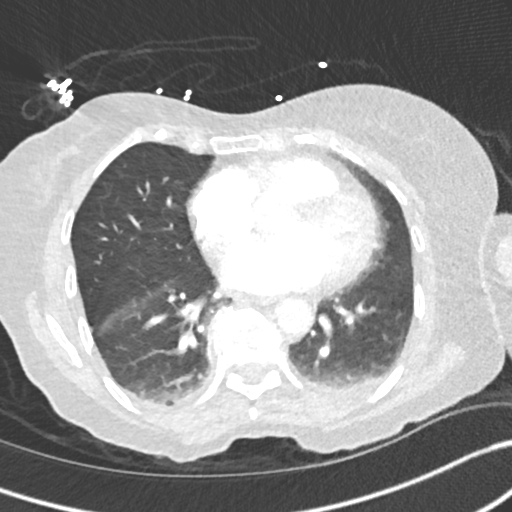
[im 124/278  mediastinal]
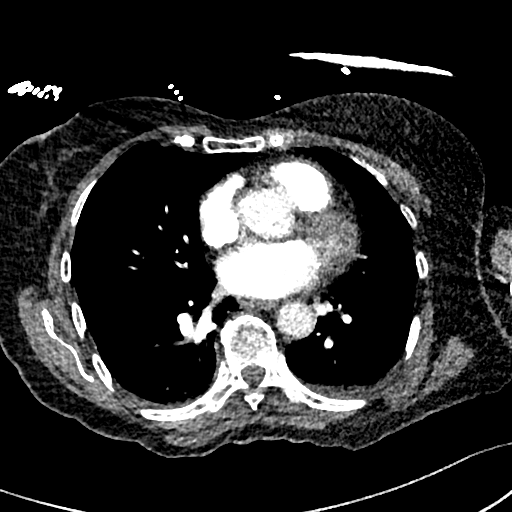
[im 139/278  lung]
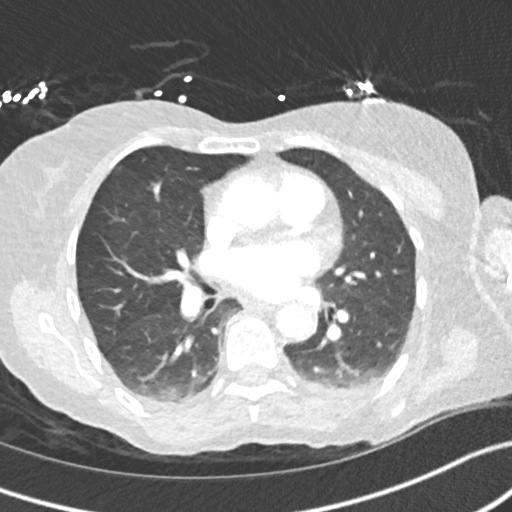
[im 154/278  mediastinal]
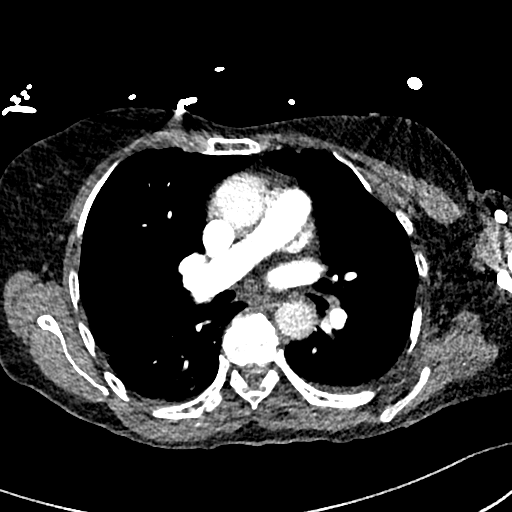
[im 170/278  lung]
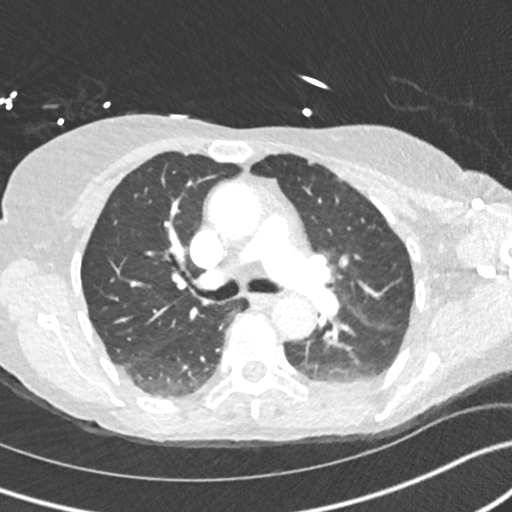
[im 185/278  mediastinal]
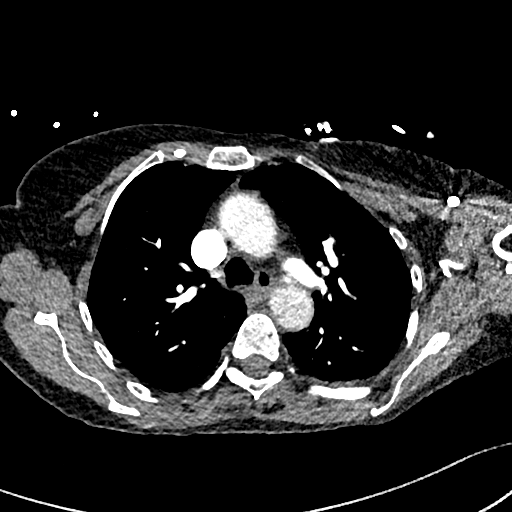
[im 201/278  lung]
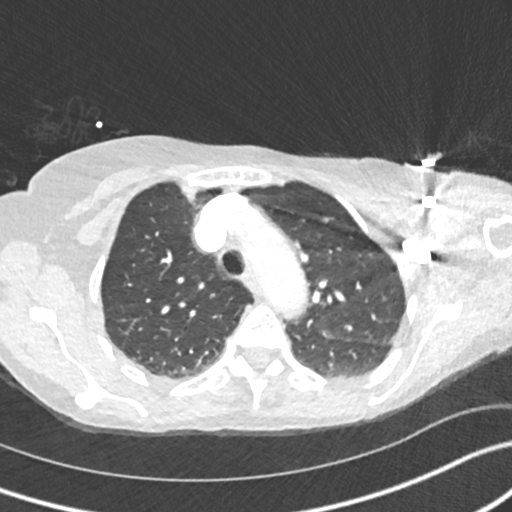
[im 216/278  mediastinal]
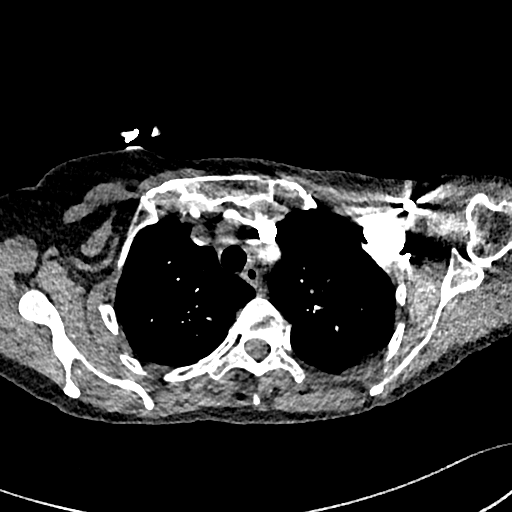
[im 231/278  lung]
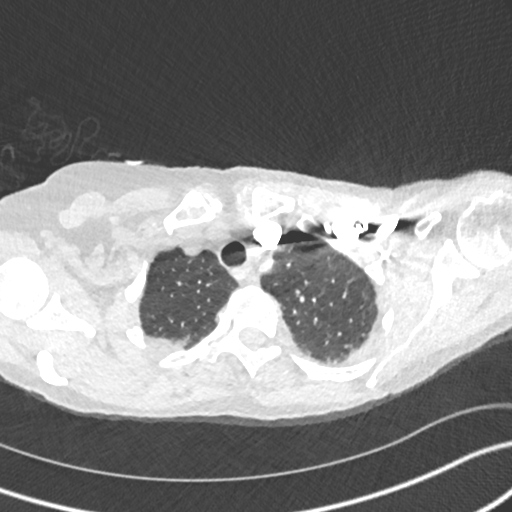
[im 247/278  mediastinal]
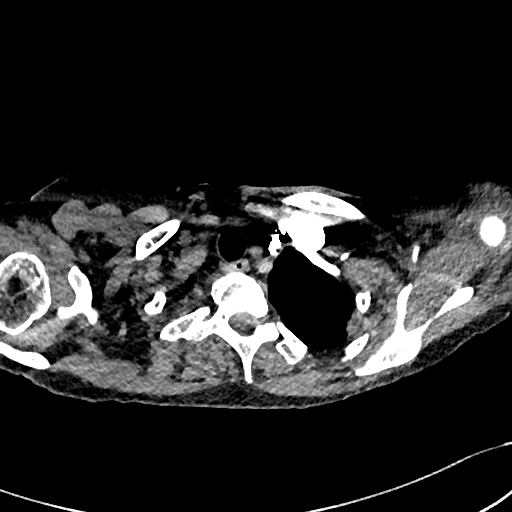
[im 262/278  lung]
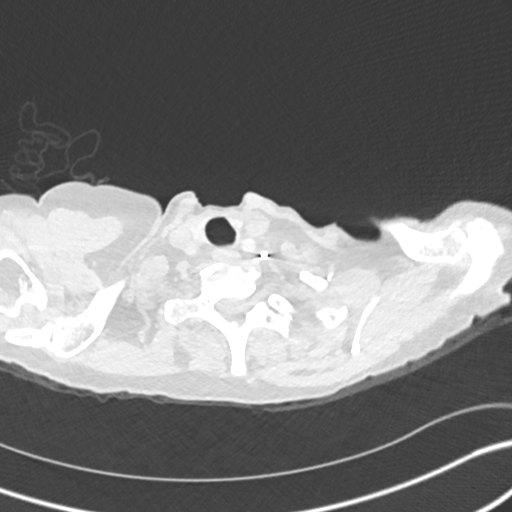

[Series 8: pe 2mm cor · coronal · 0.59mm/px · 1 of 148 slices shown]
[im 74/148  mediastinal]
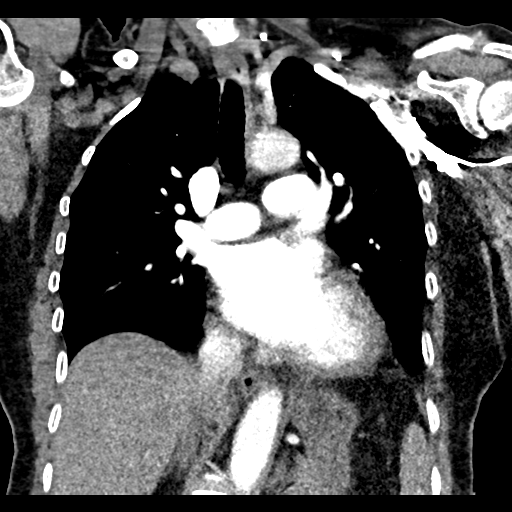

[18 of 36 positions shown; findings below may reference images not displayed]

FINDINGS: CARDIOVASCULAR: Adequate contrast opacification of the pulmonary
artery's. Main pulmonary artery is not enlarged. No pulmonary
arterial filling defects to the level of the subsegmental branches.
Heart size is mildly enlarged, no right heart strain. No pericardial
effusion. Thoracic aorta is normal course and caliber, mild calcific
atherosclerosis aortic arch.

MEDIASTINUM/NODES: No lymphadenopathy by CT size criteria.

LUNGS/PLEURA: Tracheobronchial tree is patent, no pneumothorax. No
pleural effusions, focal consolidations, pulmonary nodules or
masses. Dependent atelectasis. Bandlike atelectasis RIGHT lower
lobe.

UPPER ABDOMEN: Nonacute. 11 mm benign LEFT adrenal adenoma (0
Hounsfield units).

MUSCULOSKELETAL: Nonacute. RIGHT breast coarse calcification. Upper
thoracic levoscoliosis. Moderate degenerative change of the thoracic
spine.

Review of the MIP images confirms the above findings.
IMPRESSION: 1. No acute pulmonary embolism.
2. Mild cardiomegaly.  Atelectasis.

Aortic Atherosclerosis ([id number]-CO6.6).

## 2018-10-16 ENCOUNTER — Other Ambulatory Visit: Payer: Self-pay | Admitting: Cardiology

## 2018-10-26 ENCOUNTER — Telehealth: Payer: Self-pay | Admitting: Physician Assistant

## 2018-10-26 NOTE — Telephone Encounter (Signed)
Discussed changing appt for 3/26 to Virtual Visit due to COVID-19 pandemic. She has access to a smart phone, computer and iPad.  She would like to do the Telehealth visit. I will send her info via MyChart. She knows to gather her BP info and take her BP tomorrow AM before the visit. Tereso Newcomer, PA-C    10/26/2018 9:14 AM

## 2018-10-26 NOTE — Telephone Encounter (Signed)
  Patient is calling to request a call back because her right index finger is numb and her hands are pale. She also states that her HR is running low, it was 48 just a few minutes ago, It has not gotten above 50 all day. She also c/o a pumping feeling in her chest. She does not feel that it is anxiety. She feels like she may need to be seen in office instead of tele visit.

## 2018-10-26 NOTE — Telephone Encounter (Signed)
Spoke with the pt and she is very anxious about her HR... her BP has been running 130's/70's... HR 45-60....she has not started taking the Imdur and is only taking the Hydralazine prn after checking her BP... I advised her to have her readings in front of her when she has her televisit with Dr. Antoine Poche in the morning and to take her BP after resting prior to 8:40 and record it for him... pt verbalized understanding and agreed.

## 2018-10-27 ENCOUNTER — Ambulatory Visit (INDEPENDENT_AMBULATORY_CARE_PROVIDER_SITE_OTHER): Payer: Medicare Other | Admitting: Cardiology

## 2018-10-27 ENCOUNTER — Encounter: Payer: Self-pay | Admitting: Cardiology

## 2018-10-27 VITALS — BP 142/80 | HR 48 | Ht 63.0 in | Wt 130.0 lb

## 2018-10-27 DIAGNOSIS — I2510 Atherosclerotic heart disease of native coronary artery without angina pectoris: Secondary | ICD-10-CM | POA: Diagnosis not present

## 2018-10-27 NOTE — Telephone Encounter (Signed)
Pt had video visit with Dr Hochrein today 

## 2018-10-27 NOTE — Progress Notes (Signed)
Virtual Visit via Telephone Note    Evaluation Performed:  Follow-up visit  This visit type was conducted due to national recommendations for restrictions regarding the COVID-19 Pandemic (e.g. social distancing).  This format is felt to be most appropriate for this patient at this time.  All issues noted in this document were discussed and addressed.  No physical exam was performed (except for noted visual exam findings with Video Visits).  Please refer to the patient's chart (MyChart message for video visits and phone note for telephone visits) for the patient's consent to telehealth for [facility].  Date:  10/27/2018   ID:  Ilyana, Elward 1935/05/06, MRN [id number]  Patient Location:  [phone] [place] [address]   Provider location:   [facility]  PCP:  Terressa Koyanagi, DO  Cardiologist:  No primary care provider on file.  Electrophysiologist:  None   Chief Complaint:  CAD  History of Present Illness:    Kaitlyn Parks is a 84 y.o. female who presents via audio/video conferencing for a telehealth visit today.     She had a PCI of LAD in 2012. She recently underwent total right hip arthroplasty. She presented to the hospital on 06/12/2017 with sudden onset of right-sided neck pain with radiation to the right jaw. CTA of the chest was negative for PE however troponin was elevated at 0.42. Echocardiogram was obtained on 06/13/2017 showed EF 60-65%, mild LVH, grade 1 DD, mild MR, mild TR, peak PA pressure 30 mmHg. Cardiac catheterization performed on 06/14/2017 showed 95% ostial D1 lesion, widely patent ostial LAD stent, 30% proximal LAD stenosis. Medical therapy was recommended. Imdur 60 mg daily was added to her medical regimen. Beta blocker was initially increased and later reduced due to symptomatic bradycardia. At a previous visit I stopped the beta blocker.  She was in the ED after calling our office with headache and reports of arm tingling and facial droop.  I  reviewed the ED records.  She was slightly hypertensive.  She had a D dimer for unclear reasons but had a negative CTA for PE or dissection.      Since I last saw her she is done well.  She denies any new chest discomfort, neck or arm discomfort.  She has not had any palpitations, presyncope or syncope.  She does have some issues with her blood pressure.  She was to take 25 mg twice a day of hydralazine and her last visit but she really is taking this only as needed taking 10 mg most of the time.  She takes her blood pressure twice a day and if her blood pressures running low but when she dose adjust the medications.  She seems to do well with this.  Is not having any symptoms related to this.  She did have one episode where her index finger became a little numb.  But otherwise she is doing fine.  This was on issue and not associated with any other neurologic symptoms.    The patient does not symptoms concerning for COVID-19 infection (fever, chills, cough, or new SHORTNESS OF BREATH).    Prior CV studies:   The following studies were reviewed today:  NA  Past Medical History:  Diagnosis Date  . Arthritis    DDD, scoliosis, sees Dr. Lovell Sheehan for this, uses norco very rarely for pain  . Bradycardia 11/11/2017  . CAD (coronary artery disease)    LAD stenting of a 90% lesion 2012  . Cystocele   .  Elevated cholesterol   . GERD (gastroesophageal reflux disease)    dx in work up 2016 for atypical CP at OSH   pt. denies  . Heart murmur   . Hypothyroidism   . Interstitial cystitis    sees Dr. [name]  . NSTEMI (non-ST elevated myocardial infarction) (HCC)   . Osteoporosis   . Rectocele   . 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 Scoliosis   . Thyroid disease    Hypothyroid  . Urinary incontinence    USI  . Uterine prolapse    Past Surgical History:  Procedure Laterality Date  . BLADDER SUSPENSION  2011  . CATARACT EXTRACTION    . CORONARY ANGIOPLASTY WITH STENT PLACEMENT  04/21/2010    LAD 80%, RCA 30%, nl EF, s/p DES LAD  . EYE SURGERY    . LEFT HEART CATH AND CORONARY ANGIOGRAPHY N/A 06/14/2017   Procedure: LEFT HEART CATH AND CORONARY ANGIOGRAPHY;  Surgeon: Runell Gess, MD;  Location: [facility];  Service: Cardiovascular;  Laterality: N/A;  . OOPHORECTOMY  2011   BSO  . TOTAL HIP ARTHROPLASTY Right 06/10/2017   Procedure: RIGHT TOTAL HIP ARTHROPLASTY ANTERIOR APPROACH;  Surgeon: Samson Frederic, MD;  Location: [facility];  Service: Orthopedics;  Laterality: Right;  Needs RNFA  . VAGINAL HYSTERECTOMY  2011   [facility] BSO     Current Meds  Medication Sig  . aspirin EC 81 MG tablet Take 81 mg by mouth daily.  . Biotin w/ Vitamins C & E (HAIR/SKIN/NAILS PO) Take 1 tablet by mouth daily.  . hydrALAZINE (APRESOLINE) 25 MG tablet Take 25 mg by mouth 2 (two) times daily.  . isosorbide mononitrate (IMDUR) 60 MG 24 hr tablet Take 1 tablet (60 mg total) daily by mouth.  . Multiple Vitamins-Calcium (ONE-A-DAY WOMENS PO) Take 1 tablet by mouth daily.   . Multiple Vitamins-Minerals (PRESERVISION AREDS 2 PO) Take 1 capsule by mouth 2 (two) times daily.  . nitroGLYCERIN (NITROSTAT) 0.4 MG SL tablet Place 1 tablet (0.4 mg total) under the tongue every 5 (five) minutes as needed for chest pain.  . rosuvastatin (CRESTOR) 10 MG tablet TAKE (1/2) TABLET DAILY.  . SYNTHROID 50 MCG tablet TAKE 1 TABLET EACH DAY.     Allergies:   Acyclovir and related; Pravastatin sodium; and Zocor [simvastatin]   Social History   Tobacco Use  . Smoking status: Never Smoker  . Smokeless tobacco: Never Used  Substance Use Topics  . Alcohol use: No    Comment: Rare  . Drug use: No     Family Hx: The patient's family history includes Breast cancer in her cousin and paternal aunt; Colon cancer (age of onset: 78) in her paternal aunt; Diabetes in her sister; Endometrial cancer in her sister; Heart disease in her father, mother, and sister; Hypertension in her mother; Leukemia in her paternal  aunt.  ROS:   Please see the history of present illness.    As stated in the HPI and negative for all other systems.   Labs/Other Tests and Data Reviewed:    Recent Labs: 12/09/2017: B Natriuretic Peptide 127.6 08/17/2018: ALT 13 08/31/2018: TSH 1.75 10/09/2018: BUN 21; Creatinine, Ser 0.70; Hemoglobin 13.4; Platelets 260; Potassium 3.5; Sodium 139   Recent Lipid Panel Lab Results  Component Value Date/Time   CHOL 195 08/17/2018 08:06 AM   CHOL 160 02/05/2017 08:34 AM   TRIG 141.0 08/17/2018 08:06 AM   HDL 54.00 08/17/2018 08:06 AM   HDL 52 02/05/2017 08:34  AM   CHOLHDL 4 08/17/2018 08:06 AM   LDLCALC 112 (H) 08/17/2018 08:06 AM   LDLCALC 77 02/05/2017 08:34 AM   LDLDIRECT 140.6 05/25/2012 08:55 AM    Wt Readings from Last 3 Encounters:  10/27/18 130 lb (59 kg)  09/20/18 133 lb 3.2 oz (60.4 kg)  09/08/18 130 lb 3.2 oz (59.1 kg)     Exam:    Vital Signs:  BP (!) 142/80   Pulse (!) 48   Ht 5\' 3"  (1.6 m)   Wt 130 lb (59 kg)   BMI 23.03 kg/m    NA  ASSESSMENT & PLAN:    CAD, NATIVE VESSEL -  She has had no new symptoms.  No change in therapy is planned.  She will continue with risk reduction.   HYPERTENSION -  Blood pressure is somewhat labile.  We reviewed in detail how she will manage her BP.  She has 25 mg and 10 mg dosing.  I gave her parameters for dosing of both doses of the hydralazine.  She will keep her BP twice daily.  She will hold the dose if her BP is low.    Hyperlipidemia - Continue curret thearpy.      COVID-19 Education: The signs and symptoms of COVID-19 were discussed with the patient and how to seek care for testing (follow up with PCP or arrange E-visit).  The importance of social distancing was discussed today.  Patient Risk:   After full review of this patients clinical status, I feel that they are at least moderate risk at this time.  Time:   Today, I have spent 25 minutes with the patient with telehealth technology discussing her BP  management.     Medication Adjustments/Labs and Tests Ordered: Current medicines are reviewed at length with the patient today.  Concerns regarding medicines are outlined above.  Tests Ordered: No orders of the defined types were placed in this encounter.  Medication Changes: No orders of the defined types were placed in this encounter.   Disposition:  in 6 month(s)  Signed, Rollene Rotunda, MD  10/27/2018 10:12 AM    [facility]

## 2018-10-27 NOTE — Patient Instructions (Signed)
Medication Instructions:  Dr Antoine Poche recommends that you continue on your current medications as directed. Please refer to the Current Medication list given to you today.  If you need a refill on your cardiac medications before your next appointment, please call your pharmacy.   Follow-Up: At [facility], you and your health needs are our priority.  As part of our continuing mission to provide you with exceptional heart care, we have created designated Provider Care Teams.  These Care Teams include your primary Cardiologist (physician) and Advanced Practice Providers (APPs -  Physician Assistants and Nurse Practitioners) who all work together to provide you with the care you need, when you need it. You will need a follow up appointment in 3 months.  Please call our office 2 months in advance to schedule this appointment.  You may see one of the following Advanced Practice Providers on your designated Care Team:   Theodore Demark, PA-C . Joni Reining, DNP, ANP

## 2018-10-28 ENCOUNTER — Encounter: Payer: Self-pay | Admitting: Cardiology

## 2018-10-29 ENCOUNTER — Encounter: Payer: Self-pay | Admitting: Cardiology

## 2018-10-29 DIAGNOSIS — I2510 Atherosclerotic heart disease of native coronary artery without angina pectoris: Secondary | ICD-10-CM | POA: Insufficient documentation

## 2018-11-09 ENCOUNTER — Other Ambulatory Visit: Payer: Self-pay | Admitting: Family Medicine

## 2018-11-14 ENCOUNTER — Other Ambulatory Visit: Payer: Self-pay

## 2018-11-14 ENCOUNTER — Ambulatory Visit (INDEPENDENT_AMBULATORY_CARE_PROVIDER_SITE_OTHER): Payer: Medicare Other | Admitting: Family Medicine

## 2018-11-14 DIAGNOSIS — E039 Hypothyroidism, unspecified: Secondary | ICD-10-CM

## 2018-11-14 DIAGNOSIS — E785 Hyperlipidemia, unspecified: Secondary | ICD-10-CM

## 2018-11-14 DIAGNOSIS — I2510 Atherosclerotic heart disease of native coronary artery without angina pectoris: Secondary | ICD-10-CM

## 2018-11-14 DIAGNOSIS — I10 Essential (primary) hypertension: Secondary | ICD-10-CM

## 2018-11-14 DIAGNOSIS — R001 Bradycardia, unspecified: Secondary | ICD-10-CM

## 2018-11-14 DIAGNOSIS — G8929 Other chronic pain: Secondary | ICD-10-CM

## 2018-11-14 DIAGNOSIS — M25569 Pain in unspecified knee: Secondary | ICD-10-CM

## 2018-11-14 NOTE — Progress Notes (Signed)
Virtual Visit via Video Note  I connected with Kelynn on 11/14/18 at  8:00 AM EDT by a video enabled telemedicine application and verified that I am speaking with the correct person using two identifiers. She was not able to get the video portion working, but we were able to complete the visit with audio.  Location patient: home Location provider:work or home office Persons participating in the virtual visit: patient, provider  I discussed the limitations of evaluation and management by telemedicine and the availability of in person appointments. The patient expressed understanding and agreed to proceed.   HPI:  Kaitlyn Parks is a pleasant 84 y.o. here for follow up. Chronic medical problems summarized below were reviewed for changes and stability and were updated as needed below. These issues and their treatment remain stable for the most part.  Saw her cardiologist recently.TSH was normal in Jan, BMP and CBC reviewed form last month. Reports exercising on a regular basis. BP has been good. Has some knee pain occasionally - hx of OA. Reports mild currently and does not want to go out in the COVID19 pandemic for evaluation. Denies CP, SOB with exercise or treatment intolerance or new symptoms.  HTN/HLD/CAD: -sees cardiologist, Dr. Albert Lions -s/p stent 2012 LAD -meds: imdur, asa, hydralazine, crestor low dose due to intol -hx several medication intolerances  Hypothyroidism: -meds: synthroid  Macular Degeneration: -sees Dr. Vanessa Barbara  ROS: See pertinent positives and negatives per HPI.  Past Medical History:  Diagnosis Date  . Arthritis    DDD, scoliosis, sees Dr. Lovell Sheehan for this, uses norco very rarely for pain  . Bradycardia 11/11/2017  . CAD (coronary artery disease)    LAD stenting of a 90% lesion 2012  . Cystocele   . Elevated cholesterol   . GERD (gastroesophageal reflux disease)    dx in work up 2016 for atypical CP at OSH   pt. denies  . Heart murmur   . Hypothyroidism    . Interstitial cystitis    sees Dr. [name]  . NSTEMI (non-ST elevated myocardial infarction) (HCC)   . Osteoporosis   . Rectocele   . 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 Scoliosis   . Thyroid disease    Hypothyroid  . Urinary incontinence    USI  . Uterine prolapse     Past Surgical History:  Procedure Laterality Date  . BLADDER SUSPENSION  2011  . CATARACT EXTRACTION    . CORONARY ANGIOPLASTY WITH STENT PLACEMENT  04/21/2010   LAD 80%, RCA 30%, nl EF, s/p DES LAD  . EYE SURGERY    . LEFT HEART CATH AND CORONARY ANGIOGRAPHY N/A 06/14/2017   Procedure: LEFT HEART CATH AND CORONARY ANGIOGRAPHY;  Surgeon: Runell Gess, MD;  [facility];  Service: Cardiovascular;  Laterality: N/A;  . OOPHORECTOMY  2011   BSO  . TOTAL HIP ARTHROPLASTY Right 06/10/2017   Procedure: RIGHT TOTAL HIP ARTHROPLASTY ANTERIOR APPROACH;  Surgeon: Samson Frederic, MD;  Location: [facility];  Service: Orthopedics;  Laterality: Right;  Needs RNFA  . VAGINAL HYSTERECTOMY  2011   [facility] BSO    Family History  Problem Relation Age of Onset  . Hypertension Mother   . Heart disease Mother   . Heart disease Father   . Heart disease Sister   . Colon cancer Paternal Aunt 30  . Breast cancer Paternal Aunt        Age 68's  . Diabetes Sister   . Endometrial cancer Sister   .  Breast cancer Cousin        Maternal 1st cousins-Age 82's  . Leukemia Paternal Aunt     SOCIAL HX: se ehpi   Current Outpatient Medications:  .  aspirin EC 81 MG tablet, Take 81 mg by mouth daily., Disp: , Rfl:  .  Biotin w/ Vitamins C & E (HAIR/SKIN/NAILS PO), Take 1 tablet by mouth daily., Disp: , Rfl:  .  hydrALAZINE (APRESOLINE) 25 MG tablet, Take 25 mg by mouth 2 (two) times daily., Disp: , Rfl:  .  isosorbide mononitrate (IMDUR) 60 MG 24 hr tablet, Take 1 tablet (60 mg total) daily by mouth., Disp: 30 tablet, Rfl: 11 .  Multiple Vitamins-Calcium (ONE-A-DAY WOMENS PO), Take 1 tablet by mouth daily. , Disp: ,  Rfl:  .  Multiple Vitamins-Minerals (PRESERVISION AREDS 2 PO), Take 1 capsule by mouth 2 (two) times daily., Disp: , Rfl:  .  nitroGLYCERIN (NITROSTAT) 0.4 MG SL tablet, Place 1 tablet (0.4 mg total) under the tongue every 5 (five) minutes as needed for chest pain., Disp: 25 tablet, Rfl: 8 .  rosuvastatin (CRESTOR) 10 MG tablet, TAKE (1/2) TABLET DAILY., Disp: 15 tablet, Rfl: 1 .  SYNTHROID 50 MCG tablet, TAKE 1 TABLET EACH DAY., Disp: 90 tablet, Rfl: 0  EXAM:  VITALS per patient if applicable: 118/64, 128 wt  GENERAL: alert, oriented,  No audible acute distress  LUNGS: no audible signs of respiratory distress, no obvious gross SOB, gasping or wheezing   PSYCH/NEURO: pleasant and cooperative, no obvious depression or anxiety, speech and thought processing grossly intact  ASSESSMENT AND PLAN:  Discussed the following assessment and plan:  Coronary artery disease involving native coronary artery of native heart without angina pectoris  Dyslipidemia  Bradycardia  Hypothyroidism (acquired)  Hypertension, unspecified type  Stable.  Continue to monitor BP at home.  Encouraged her to continue gentle regular exercise, topical sports creams and tylenol prn for the knee pain. She prefers to avoid an office visit given the COVID19 pandemic, but plans to see her orthopedic specialist once the pandemic is over. Agrees to call if worsening or other concerns.  Lifestyle recs.   Social distancing. She does ordered groceries, has a good stock of stored food. Neighbor is a Engineer, civil (consulting)nurse and checks in on her, and so do folks from [employer].    I discussed the assessment and treatment plan with the patient. The patient was provided an opportunity to ask questions and all were answered. The patient agreed with the plan and demonstrated an understanding of the instructions.   The patient was advised to call back or seek an in-person evaluation if the symptoms worsen or if the condition fails to improve  as anticipated.   Follow up instructions: Advised assistant Ronnald CollumJo Anne to help patient arrange the following: -TOC with Dr. Clemens CatholicKoberlein  Parrish Bonn R Dawanda Mapel, DO

## 2018-11-15 ENCOUNTER — Ambulatory Visit: Payer: Medicare Other | Admitting: Cardiology

## 2018-11-17 ENCOUNTER — Telehealth: Payer: Self-pay | Admitting: Cardiology

## 2018-11-17 NOTE — Telephone Encounter (Signed)
New message:   Patient would like for some one to call her concering her some tingling in her left arm

## 2018-11-17 NOTE — Telephone Encounter (Signed)
Called patient, she states that every day this week, she has had tingling in her left arm that comes and goes. She has noticed that it is worse after she exercises in the morning. She states that today- she checked her BP and it was 182/92 today was the first time she has had this high of BP, she then took a 10 mg Hydralazine as instructed by Dr.Hochrein. Patient states that she does have some back pain as well, she denies chest pains, SOB, or swelling. She does mention she gets fatigued after walking. She states when she gets the tingling in her arm she moves it around and walks, and it helps it to go away.   Patient is questioning if she should have some tests ran (stress test?)   Advised patient I would route a message for any recommendations at this point.

## 2018-11-17 NOTE — Telephone Encounter (Signed)
juile - I think this question is more for Dr. Antoine Poche.  I tend to think tingling in the extremities is often due to a pinched or blocked nerve somewhere up the line from that limb - it could be related to her back pain.   And when she exercises she may be aggravating the problem.  As for the elevated pressure, it could have been an isolated event due in part to the back pain.  She should continue the hydralazine as Dr. Antoine Poche prescribed, but she may need to check with PCP regarding that tingling.

## 2018-11-17 NOTE — Telephone Encounter (Signed)
Follow up:      Patient calling back. Please call patient 

## 2018-11-18 NOTE — Telephone Encounter (Signed)
Pt advised the pharmacists recommendation ... she also felt could be a nerve problem... she is going to call Dr. Selena Batten... she will keep track of her BP and will call her if Dr. Antoine Poche has any further recommendations.

## 2018-11-18 NOTE — Telephone Encounter (Signed)
Returned call, advised with patient that a message was sent to Dr.Hochrein for his recommendations and once we received that we would call her back.  Patient verbalized understanding.

## 2018-11-30 NOTE — Progress Notes (Signed)
[facility] - Clinic Note  12/01/2018     CHIEF COMPLAINT Patient presents for Retina Evaluation   HISTORY OF PRESENT ILLNESS: Kaitlyn Parks is a 84 y.o. female who presents to the clinic today for:   HPI    Retina Evaluation    In left eye.  This started 1 week ago.  Duration of 1 week.  Associated Symptoms Negative for Flashes, Blind Spot, Photophobia, Scalp Tenderness, Fever, Floaters, Pain, Glare, Jaw Claudication, Weight Loss, Distortion, Redness, Trauma, Shoulder/Hip pain and Fatigue.  Context:  distance vision, mid-range vision, near vision, reading and watching TV.  Treatments tried include no treatments.  I, the attending physician,  performed the HPI with the patient and updated documentation appropriately.          Comments    Patient states vision change OS for about 2 weeks. Vision change has been gradual and at first, patient attributed vision change to allergies. Yesterday patient covered OD to look at clock and noticed orange or brown spot in vision OS. On AREDS 2 vitamins (2 daily) for several years.       Last edited by Rennis Chris, MD on 12/01/2018 10:29 AM. (History)    pt states she saw Dr. Krista Blue yesterday bc her left eye vision has gone down considerably over the past couple of weeks  Referring physician: Terressa Koyanagi, DO [address] [place], [place] [address]  HISTORICAL INFORMATION:   Selected notes from the MEDICAL RECORD NUMBER Referred by Dr. [place] DeMarco for concern of exu ARMD LEE: 11.26.19 (J. 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 (Ophthalmic Drugs)   No current facility-administered medications for this visit.  (Ophthalmic Drugs)   Current Outpatient Medications (Other)  Medication Sig  . aspirin EC 81 MG tablet Take 81 mg by mouth daily.  .  Biotin w/ Vitamins C & E (HAIR/SKIN/NAILS PO) Take 1 tablet by mouth daily.  . isosorbide mononitrate (IMDUR) 60 MG 24 hr tablet Take 1 tablet (60 mg total) daily by mouth.  . Multiple Vitamins-Calcium (ONE-A-DAY WOMENS PO) Take 1 tablet by mouth daily.   . Multiple Vitamins-Minerals (PRESERVISION AREDS 2 PO) Take 1 capsule by mouth 2 (two) times daily.  . nitroGLYCERIN (NITROSTAT) 0.4 MG SL tablet Place 1 tablet (0.4 mg total) under the tongue every 5 (five) minutes as needed for chest pain.  . rosuvastatin (CRESTOR) 10 MG tablet TAKE (1/2) TABLET DAILY.  . SYNTHROID 50 MCG tablet TAKE 1 TABLET EACH DAY.  . hydrALAZINE (APRESOLINE) 25 MG tablet Take 25 mg by mouth 2 (two) times daily.   No current facility-administered medications for this visit.  (Other)      REVIEW OF SYSTEMS: ROS    Positive for: Endocrine, Eyes   Negative for: Constitutional, Gastrointestinal, Neurological, Skin, Genitourinary, Musculoskeletal, HENT, Cardiovascular, Respiratory, Psychiatric, Allergic/Imm, Heme/Lymph   Last edited by Annalee Genta D on 12/01/2018  9:21 AM. (History)       ALLERGIES Allergies  Allergen Reactions  . Acyclovir And Related Other (See Comments)    unknown  . Pravastatin Sodium Other (See Comments)    cystitis  . Zocor [Simvastatin] Other (See Comments)    cystitis    PAST MEDICAL HISTORY Past Medical History:  Diagnosis Date  . Arthritis    DDD, scoliosis, sees Dr. Lovell Sheehan for this, uses norco very rarely  for pain  . Bradycardia 11/11/2017  . CAD (coronary artery disease)    LAD stenting of a 90% lesion 2012  . Cystocele   . Elevated cholesterol   . GERD (gastroesophageal reflux disease)    dx in work up 2016 for atypical CP at OSH   pt. denies  . Heart murmur   . Hypothyroidism   . Interstitial cystitis    sees Dr. [name]  . NSTEMI (non-ST elevated myocardial infarction) (HCC)   . Osteoporosis   . Rectocele   . 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 Scoliosis   .  Thyroid disease    Hypothyroid  . Urinary incontinence    USI  . Uterine prolapse    Past Surgical History:  Procedure Laterality Date  . BLADDER SUSPENSION  2011  . CATARACT EXTRACTION Bilateral 2015   Dr. Elmer Picker  . CORONARY ANGIOPLASTY WITH STENT PLACEMENT  04/21/2010   LAD 80%, RCA 30%, nl EF, s/p DES LAD  . EYE SURGERY    . LEFT HEART CATH AND CORONARY ANGIOGRAPHY N/A 06/14/2017   Procedure: LEFT HEART CATH AND CORONARY ANGIOGRAPHY;  Surgeon: Runell Gess, MD;  [facility]: [facility];  Service: Cardiovascular;  Laterality: N/A;  . OOPHORECTOMY  2011   BSO  . TOTAL HIP ARTHROPLASTY Right 06/10/2017   Procedure: RIGHT TOTAL HIP ARTHROPLASTY ANTERIOR APPROACH;  Surgeon: Samson Frederic, MD;  Location: [facility];  Service: Orthopedics;  Laterality: Right;  Needs RNFA  . VAGINAL HYSTERECTOMY  2011   [facility] BSO    FAMILY HISTORY Family History  Problem Relation Age of Onset  . Hypertension Mother   . Heart disease Mother   . Heart disease Father   . Heart disease Sister   . Colon cancer Paternal Aunt 75  . Breast cancer Paternal Aunt        Age 26's  . Diabetes Sister   . Endometrial cancer Sister   . Breast cancer Cousin        Maternal 1st cousins-Age 78's  . Leukemia Paternal Aunt     SOCIAL HISTORY Social History   Tobacco Use  . Smoking status: Never Smoker  . Smokeless tobacco: Never Used  Substance Use Topics  . Alcohol use: No    Comment: Rare  . Drug use: No         OPHTHALMIC EXAM:  Base Eye Exam    Visual Acuity (Snellen - Linear)      Right Left   Dist [place] 20/25 -2 20/70 +1   Dist ph [place] NI 20/50 -2       Tonometry (Tonopen, 9:35 AM)      Right Left   Pressure 12 13       Pupils      Dark Light Shape React APD   Right 3 2 Round Brisk None   Left 3 2 Round Brisk None       Visual Fields (Counting fingers)      Left Right    Full Full       Extraocular Movement      Right Left    Full, Ortho Full, Ortho       Neuro/Psych     Oriented x3:  Yes   Mood/Affect:  Normal       Dilation    Both eyes:  1.0% Mydriacyl, 2.5% Phenylephrine @ 9:35 AM        Slit Lamp and Fundus Exam    Slit Lamp Exam  Right Left   Lids/Lashes mild Telangiectasia, marginal leasion nasal UL Telangiectasia, Meibomian gland dysfunction   Conjunctiva/Sclera White and quiet White and quiet   Cornea Arcus, 1 Punctate epithelial erosions 1-2+ Punctate epithelial erosions, Arcus   Anterior Chamber Deep and clear Deep and clear   Iris Round and dilated Round and well dilated   Lens PC IOL in good position PC IOL in excellent position   Vitreous Vitreous syneresis, Posterior vitreous detachment Vitreous syneresis, Posterior vitreous detachment       Fundus Exam      Right Left   Disc Pink and Sharp, mild PPP, peripapillary heme at 1200 Pink and Sharp, mild temporal PPA   C/D Ratio 0.4 0.3   Macula Blunted foveal reflex, Drusen, RPE mottling and clumping, early Atrophy, +PEDs,  No heme or edema Blunted foveal reflex, large central PED with overlying SRF/IRF with +heme; +drusen, pigment clumping, subretinal heme   Vessels Vascular attenuation Vascular attenuation   Periphery Attached, scattered reticular degeneration   Attached, scattered reticular degeneration        Refraction    Manifest Refraction      Dist VA   Right 20/25-2   Left 20/80          IMAGING AND PROCEDURES  Imaging and Procedures for @  OCT, Retina - OU - Both Eyes       Right Eye Quality was good. Central Foveal Thickness: 282. Progression has been stable. Findings include abnormal foveal contour, retinal drusen , pigment epithelial detachment, outer retinal atrophy, intraretinal hyper-reflective material (Focal area of IRHM overlying temporal PED -- no significant change from prior).   Left Eye Quality was good. Central Foveal Thickness: 833. Progression has been stable. Findings include abnormal foveal contour, retinal drusen , intraretinal  fluid, outer retinal atrophy, pigment epithelial detachment, intraretinal hyper-reflective material, subretinal fluid (Large dome shaped PED with overlying IRF/SRF).   Notes *Images captured and stored on drive  Diagnosis / Impression:  OD: nonexudative ARMD OU OS: exu-ARMD -- interval conversation from non-exu with large, central dome-shaped PED with overlying IRF/SRF   Clinical management:  See below  Abbreviations: NFP - Normal foveal profile. CME - cystoid macular edema. PED - pigment epithelial detachment. IRF - intraretinal fluid. SRF - subretinal fluid. EZ - ellipsoid zone. ERM - epiretinal membrane. ORA - outer retinal atrophy. ORT - outer retinal tubulation. SRHM - subretinal hyper-reflective material        Intravitreal Injection, Pharmacologic Agent - OS - Left Eye       Time Out 12/01/2018. 11:15 AM. Confirmed correct patient, procedure, site, and patient consented.   Anesthesia Topical anesthesia was used. Anesthetic medications included Lidocaine 2%, Proparacaine 0.5%.   Procedure Preparation included 5% betadine to ocular surface. A supplied needle was used.   Injection:  1.25 mg Bevacizumab (AVASTIN) SOLN   NDC: 91478-295-62, Lot: [id number] , Expiration date: 01/04/2019   Route: Intravitreal, Site: Left Eye, Waste: 0 mg  Post-op Post injection exam found visual acuity of at least counting fingers. The patient tolerated the procedure well. There were no complications. The patient received written and verbal post procedure care education.                 ASSESSMENT/PLAN:    ICD-10-CM   1. Exudative age-related macular degeneration of left eye with active choroidal neovascularization (HCC) H35.3221 Intravitreal Injection, Pharmacologic Agent - OS - Left Eye    Bevacizumab (AVASTIN) SOLN 1.25 mg  2. Retinal edema H35.81 OCT, Retina - OU -  Both Eyes  3. Intermediate stage nonexudative age-related macular degeneration of right eye H35.3112   4. Essential  hypertension I10   5. Hypertensive retinopathy of both eyes H35.033   6. Pseudophakia of both eyes Z96.1     1. Exudative age related macular degeneration, left eye.    - The incidence pathology and anatomy of wet AMD discussed   - The ANCHOR, MARINA, CATT and VIEW trials discussed with patient.    - discussed treatment options including observation vs intravitreal anti-VEGF agents such as Avastin, Lucentis, Eylea.    - Risks of endophthalmitis and vascular occlusive events and atrophic changes discussed with patient  - OCT shows interval conversion of OD from nonexudative ARMD to exu-ARMD with large dome-shaped PED with overlying IRF/SRF  - recommend IVA OS #1 today, 4.30.2020  - pt wishes to be treated with IVA  - RBA of procedure discussed, questions answered  - informed consent obtained and signed  - see procedure note  - after discussion of various therapies, pt wshes to proceed with benefits investigation for Eylea -- signed 4.30.2020  - f/u in 4 wks, OCT, DFE, FA (optos, transit OS)  3. Age related macular degeneration, non-exudative, OD  - pt initially presented on 11.27.19 due to alert from [facility] home monitoring system for OD, but was unable to stay for fluorescein angiogram at that time  - [facility] prescribed by Dr. Elmer PickerHecker  - no heme or obvious CNVM on exam  - OCT shows PEDs with tr cystic changes OD -- stable from prior  - FA (12.16.19) shows staining / window defect corresponding temporal RPE changes OU -- no active CNV OU  - The incidence, anatomy, and pathology of dry AMD, risk of progression, and the AREDS and AREDS 2 study including smoking risks discussed with patient.  - Recommend amsler grid monitoring  4,5. Hypertensive retinopathy OU  - discussed importance of tight BP control  - monitor  6. Pseudophakia OU  - s/p CE/IOL OU  - beautiful surgeries, doing well  - monitor   Ophthalmic Meds Ordered this visit:  Meds ordered this encounter  Medications  .  Bevacizumab (AVASTIN) SOLN 1.25 mg       Return in about 4 weeks (around 12/29/2018) for Dilated Exam, OCT, Possible Injxn.  There are no Patient Instructions on file for this visit.   Explained the diagnoses, plan, and follow up with the patient and they expressed understanding.  Patient expressed understanding of the importance of proper follow up care.   This document serves as a record of services personally performed by Karie ChimeraBrian G. Kataleia Quaranta, MD, PhD. It was created on their behalf by Laurian BrimAmanda Brown, OA, an ophthalmic assistant. The creation of this record is the provider's dictation and/or activities during the visit.    Electronically signed by: Laurian BrimAmanda Brown, OA  04.29.2020 12:35 AM    Karie ChimeraBrian G. Bhavya Grand, M.D., Ph.D. Diseases & Surgery of the Retina and Vitreous [facility]  I have reviewed the above documentation for accuracy and completeness, and I agree with the above. Karie ChimeraBrian G. Yoselin Amerman, M.D., Ph.D. 12/02/18 12:40 AM     Abbreviations: M myopia (nearsighted); A astigmatism; H hyperopia (farsighted); P presbyopia; Mrx spectacle prescription;  CTL contact lenses; OD right eye; OS left eye; OU both eyes  XT exotropia; ET esotropia; PEK punctate epithelial keratitis; PEE punctate epithelial erosions; DES dry eye syndrome; MGD meibomian gland dysfunction; ATs artificial tears; PFAT's preservative free artificial tears; NSC nuclear sclerotic cataract; PSC  posterior subcapsular cataract; ERM epi-retinal membrane; PVD posterior vitreous detachment; RD retinal detachment; DM diabetes mellitus; DR diabetic retinopathy; NPDR non-proliferative diabetic retinopathy; PDR proliferative diabetic retinopathy; CSME clinically significant macular edema; DME diabetic macular edema; dbh dot blot hemorrhages; CWS cotton wool spot; POAG primary open angle glaucoma; C/D cup-to-disc ratio; HVF humphrey visual field; GVF goldmann visual field; OCT optical coherence tomography; IOP intraocular  pressure; BRVO Branch retinal vein occlusion; CRVO central retinal vein occlusion; CRAO central retinal artery occlusion; BRAO branch retinal artery occlusion; RT retinal tear; SB scleral buckle; PPV pars plana vitrectomy; VH Vitreous hemorrhage; PRP panretinal laser photocoagulation; IVK intravitreal kenalog; VMT vitreomacular traction; MH Macular hole;  NVD neovascularization of the disc; NVE neovascularization elsewhere; AREDS age related eye disease study; ARMD age related macular degeneration; POAG primary open angle glaucoma; EBMD epithelial/anterior basement membrane dystrophy; ACIOL anterior chamber intraocular lens; IOL intraocular lens; PCIOL posterior chamber intraocular lens; Phaco/IOL phacoemulsification with intraocular lens placement; PRK photorefractive keratectomy; LASIK laser assisted in situ keratomileusis; HTN hypertension; DM diabetes mellitus; COPD chronic obstructive pulmonary disease

## 2018-12-01 ENCOUNTER — Other Ambulatory Visit: Payer: Self-pay

## 2018-12-01 ENCOUNTER — Encounter (INDEPENDENT_AMBULATORY_CARE_PROVIDER_SITE_OTHER): Payer: Self-pay | Admitting: Ophthalmology

## 2018-12-01 ENCOUNTER — Ambulatory Visit (INDEPENDENT_AMBULATORY_CARE_PROVIDER_SITE_OTHER): Payer: Medicare Other | Admitting: Ophthalmology

## 2018-12-01 DIAGNOSIS — H35033 Hypertensive retinopathy, bilateral: Secondary | ICD-10-CM

## 2018-12-01 DIAGNOSIS — I10 Essential (primary) hypertension: Secondary | ICD-10-CM

## 2018-12-01 DIAGNOSIS — H353221 Exudative age-related macular degeneration, left eye, with active choroidal neovascularization: Secondary | ICD-10-CM

## 2018-12-01 DIAGNOSIS — H3581 Retinal edema: Secondary | ICD-10-CM | POA: Diagnosis not present

## 2018-12-01 DIAGNOSIS — H353112 Nonexudative age-related macular degeneration, right eye, intermediate dry stage: Secondary | ICD-10-CM

## 2018-12-01 DIAGNOSIS — Z961 Presence of intraocular lens: Secondary | ICD-10-CM

## 2018-12-01 MED ORDER — BEVACIZUMAB CHEMO INJECTION 1.25MG/0.05ML SYRINGE FOR KALEIDOSCOPE
1.2500 mg | INTRAVITREAL | Status: AC | PRN
  Administered 2018-12-01: 1.25 mg via INTRAVITREAL

## 2018-12-06 ENCOUNTER — Ambulatory Visit: Payer: Medicare Other

## 2018-12-13 ENCOUNTER — Other Ambulatory Visit: Payer: Self-pay | Admitting: Physician Assistant

## 2018-12-28 NOTE — Progress Notes (Signed)
[facility] - Clinic Note  12/29/2018     CHIEF COMPLAINT Patient presents for Retina Follow Up   HISTORY OF PRESENT ILLNESS: Kaitlyn Parks is a 84 y.o. female who presents to the clinic today for:   HPI    Retina Follow Up    Patient presents with  Dry AMD.  In left eye.  This started 7.  Since onset it is stable.  I, the attending physician,  performed the HPI with the patient and updated documentation appropriately.          Comments    F/U EXU ARMD OS. Patient states she occasionally wakes up with dry eyes,she applies soothe gtt's which relieves the dryness. Pt reports last night after looking at her phone ,she had a yellow circle to appear in her vision, it faded after couple minutes. Denies ocular pain, flashes and floaters. Pt is using Soothe, Pataday gtt's PRN        Last edited by Rennis Chris, MD on 12/29/2018  9:59 AM. (History)    Pt states her vision is improving since having her injection.  Patient states she notices auras of light when using cell phone--thinks this is from blue light from cell phone.  Patient notices more floaters only when lying down or in areas of high contrast.  Patient complains that eyes are very dry.  Patient states she has tried and failed restasis--did not use for any extended period of time.  Patient also tried [place] but states it burned to the point of not wanting to use.  Referring physician: Terressa Koyanagi, DO [address] [place], [place] [address]  HISTORICAL INFORMATION:   Selected notes from the MEDICAL RECORD NUMBER Referred by Dr. [place] DeMarco for concern of exu ARMD LEE: 11.26.19 (J. 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 (Ophthalmic Drugs)   No current facility-administered medications for this visit.  (Ophthalmic Drugs)   Current  Outpatient Medications (Other)  Medication Sig  . aspirin EC 81 MG tablet Take 81 mg by mouth daily.  . Biotin w/ Vitamins C & E (HAIR/SKIN/NAILS PO) Take 1 tablet by mouth daily.  . hydrALAZINE (APRESOLINE) 25 MG tablet Take 25 mg by mouth 2 (two) times daily.  . isosorbide mononitrate (IMDUR) 60 MG 24 hr tablet TAKE 1 TABLET ONCE DAILY.  . Multiple Vitamins-Calcium (ONE-A-DAY WOMENS PO) Take 1 tablet by mouth daily.   . Multiple Vitamins-Minerals (PRESERVISION AREDS 2 PO) Take 1 capsule by mouth 2 (two) times daily.  . nitroGLYCERIN (NITROSTAT) 0.4 MG SL tablet Place 1 tablet (0.4 mg total) under the tongue every 5 (five) minutes as needed for chest pain.  . rosuvastatin (CRESTOR) 10 MG tablet TAKE (1/2) TABLET DAILY.  . SYNTHROID 50 MCG tablet TAKE 1 TABLET EACH DAY.   No current facility-administered medications for this visit.  (Other)      REVIEW OF SYSTEMS: ROS    Positive for: Eyes   Negative for: Constitutional, Gastrointestinal, Neurological, Skin, Genitourinary, Musculoskeletal, HENT, Endocrine, Cardiovascular, Respiratory, Psychiatric, Allergic/Imm, Heme/Lymph   Last edited by Eldridge Scot, LPN on 01/05/5408  9:23 AM. (History)       ALLERGIES Allergies  Allergen Reactions  . Acyclovir And Related Other (See Comments)    unknown  . Pravastatin Sodium Other (See Comments)    cystitis  . Zocor [Simvastatin] Other (See  Comments)    cystitis    PAST MEDICAL HISTORY Past Medical History:  Diagnosis Date  . Arthritis    DDD, scoliosis, sees Dr. Lovell Sheehan for this, uses norco very rarely for pain  . Bradycardia 11/11/2017  . CAD (coronary artery disease)    LAD stenting of a 90% lesion 2012  . Cystocele   . Elevated cholesterol   . GERD (gastroesophageal reflux disease)    dx in work up 2016 for atypical CP at OSH   pt. denies  . Heart murmur   . Hypothyroidism   . Interstitial cystitis    sees Dr. [name]  . NSTEMI (non-ST elevated myocardial infarction) (HCC)    . Osteoporosis   . Rectocele   . 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 Scoliosis   . Thyroid disease    Hypothyroid  . Urinary incontinence    USI  . Uterine prolapse    Past Surgical History:  Procedure Laterality Date  . BLADDER SUSPENSION  2011  . CATARACT EXTRACTION Bilateral 2015   Dr. Elmer Picker  . CORONARY ANGIOPLASTY WITH STENT PLACEMENT  04/21/2010   LAD 80%, RCA 30%, nl EF, s/p DES LAD  . EYE SURGERY    . LEFT HEART CATH AND CORONARY ANGIOGRAPHY N/A 06/14/2017   Procedure: LEFT HEART CATH AND CORONARY ANGIOGRAPHY;  Surgeon: Runell Gess, MD;  [facility];  Service: Cardiovascular;  Laterality: N/A;  . OOPHORECTOMY  2011   BSO  . TOTAL HIP ARTHROPLASTY Right 06/10/2017   Procedure: RIGHT TOTAL HIP ARTHROPLASTY ANTERIOR APPROACH;  Surgeon: Samson Frederic, MD;  Location: [facility];  Service: Orthopedics;  Laterality: Right;  Needs RNFA  . VAGINAL HYSTERECTOMY  2011   [facility] BSO    FAMILY HISTORY Family History  Problem Relation Age of Onset  . Hypertension Mother   . Heart disease Mother   . Heart disease Father   . Heart disease Sister   . Colon cancer Paternal Aunt 63  . Breast cancer Paternal Aunt        Age 60's  . Diabetes Sister   . Endometrial cancer Sister   . Breast cancer Cousin        Maternal 1st cousins-Age 62's  . Leukemia Paternal Aunt     SOCIAL HISTORY Social History   Tobacco Use  . Smoking status: Never Smoker  . Smokeless tobacco: Never Used  Substance Use Topics  . Alcohol use: No    Comment: Rare  . Drug use: No         OPHTHALMIC EXAM:  Base Eye Exam    Visual Acuity (Snellen - Linear)      Right Left   Dist [place] 20/30 +2 20/50 -1   Dist ph [place] NI 20/30 -2       Tonometry (Tonopen, 9:33 AM)      Right Left   Pressure 14 14       Pupils      Dark Light Shape React APD   Right 4 3 Round Brisk None   Left 4 3 Round Brisk None       Visual Fields      Left Right    Full Full        Extraocular Movement      Right Left    Ortho Ortho    -- -- --  --  --  -- -- --   -- -- --  --  --  -- -- --  Neuro/Psych    Oriented x3:  Yes   Mood/Affect:  Normal       Dilation    Both eyes:          Slit Lamp and Fundus Exam    Slit Lamp Exam      Right Left   Lids/Lashes mild Telangiectasia, marginal leasion nasal UL Telangiectasia, Meibomian gland dysfunction   Conjunctiva/Sclera White and quiet White and quiet   Cornea Arcus, 1 Punctate epithelial erosions 1-2+ Punctate epithelial erosions, Arcus   Anterior Chamber Deep and clear Deep and clear   Iris Round and dilated Round and well dilated   Lens PC IOL in good position PC IOL in excellent position   Vitreous Vitreous syneresis, Posterior vitreous detachment Vitreous syneresis, Posterior vitreous detachment       Fundus Exam      Right Left   Disc Pink and Sharp, mild PPP, peripapillary heme at 1200--persistent Pink and Sharp, mild temporal PPA   C/D Ratio 0.4 0.3   Macula Blunted foveal reflex, Drusen, RPE mottling and clumping, early Atrophy, +PEDs,  No heme or edema Blunted foveal reflex, large central PED with overlying SRF/IRF with +heme--resolved; +drusen, pigment clumping   Vessels Vascular attenuation Vascular attenuation   Periphery Attached, scattered reticular degeneration   Attached, scattered reticular degeneration          IMAGING AND PROCEDURES  Imaging and Procedures for @  OCT, Retina - OU - Both Eyes       Right Eye Quality was good. Central Foveal Thickness: 277. Progression has been stable. Findings include abnormal foveal contour, retinal drusen , pigment epithelial detachment, outer retinal atrophy, intraretinal hyper-reflective material, no IRF, no SRF (Focal area of IRHM overlying temporal PED -- no significant change from prior).   Left Eye Quality was good. Central Foveal Thickness: 221. Progression has improved. Findings include retinal drusen , outer retinal  atrophy, pigment epithelial detachment, intraretinal hyper-reflective material, normal foveal contour, no SRF, no IRF (Interval resolution of bullous PED and IRF/SRF -- now just low lying PED with overlying ORA).   Notes *Images captured and stored on drive  Diagnosis / Impression:  OD: nonexudative ARMD OU OS: exu-ARMD -- Interval resolution of bullous PED and IRF/SRF -- now just low lying PED with overlying ORA   Clinical management:  See below  Abbreviations: NFP - Normal foveal profile. CME - cystoid macular edema. PED - pigment epithelial detachment. IRF - intraretinal fluid. SRF - subretinal fluid. EZ - ellipsoid zone. ERM - epiretinal membrane. ORA - outer retinal atrophy. ORT - outer retinal tubulation. SRHM - subretinal hyper-reflective material        Fluorescein Angiography Optos (Transit OS)       Right Eye   Progression has been stable. Early phase findings include staining. Mid/Late phase findings include staining.   Left Eye   Progression has improved. Early phase findings include staining. Mid/Late phase findings include staining.   Notes Images stored on drive;   Impression: ARMD OU OD: staining temporal parafovea OS: exu ARMD with inactive CNV         Intravitreal Injection, Pharmacologic Agent - OS - Left Eye       Time Out 12/29/2018. 10:27 AM. Confirmed correct patient, procedure, site, and patient consented.   Anesthesia Topical anesthesia was used. Anesthetic medications included Lidocaine 2%, Proparacaine 0.5%.   Procedure Preparation included 5% betadine to ocular surface, eyelid speculum. A 30 gauge needle was used.   Injection:  1.25 mg Bevacizumab (  Jackquline BerlinVASTIN) SOLN   NDC: 62952-841-32: 50242-060-01, Lot: [id number] , Expiration date: 02/22/2019   Route: Intravitreal, Site: Left Eye, Waste: 0 mL  Post-op Post injection exam found visual acuity of at least counting fingers. The patient tolerated the procedure well. There were no complications. The  patient received written and verbal post procedure care education.                 ASSESSMENT/PLAN:    ICD-10-CM   1. Exudative age-related macular degeneration of left eye with active choroidal neovascularization (HCC) K93.8182H35.3221 Fluorescein Angiography Optos (Transit OS)    Intravitreal Injection, Pharmacologic Agent - OS - Left Eye    Bevacizumab (AVASTIN) SOLN 1.25 mg  2. Retinal edema H35.81 OCT, Retina - OU - Both Eyes  3. Intermediate stage nonexudative age-related macular degeneration of right eye H35.3112 Fluorescein Angiography Optos (Transit OS)  4. Essential hypertension I10   5. Hypertensive retinopathy of both eyes H35.033 Fluorescein Angiography Optos (Transit OS)  6. Pseudophakia of both eyes Z96.1   7. Intermediate stage nonexudative age-related macular degeneration of both eyes H35.3132     1. Exudative age related macular degeneration, left eye.    - OCT 4.30.2020 showed interval conversion of OD from nonexudative ARMD to exu-ARMD with large dome-shaped PED with overlying IRF/SRF  - s/p IVA OS #1 (04.30.20)  - today, interval resolution of PED w/ overlying IRF/SRF -- back to baseline low-lying PED  - FA 5.28.2020 -- no active CNV OS, just staining  - discussed findings and excellent response to IVA  - recommend IVA OS #2 today, 5.28.2020 -- maintenance  - pt wishes to be treated with IVA OS  - RBA of procedure discussed, questions answered  - informed consent obtained and signed  - see procedure note  - benefits investigation for [place] initiated 4.30.2020 -- approved as of 5.28.2020  - f/u in 4 wks, OCT, DFE, possible injection  3. Age related macular degeneration, non-exudative, OD  - pt initially presented on 11.27.19 due to alert from [facility] home monitoring system for OD, but was unable to stay for fluorescein angiogram at that time  - [facility] prescribed by Dr. Elmer PickerHecker  - no heme or obvious CNVM on exam  - OCT shows PEDs with tr cystic changes OD --  stable from prior  - repeat FA (5.28.2020) shows staining / window defect corresponding temporal RPE changes OU -- no active CNV OU  - The incidence, anatomy, and pathology of dry AMD, risk of progression, and the AREDS and AREDS 2 study including smoking risks discussed with patient.  - Recommend amsler grid monitoring  4,5. Hypertensive retinopathy OU  - discussed importance of tight BP control  - monitor  6. Pseudophakia OU  - s/p CE/IOL OU  - beautiful surgeries, doing well  - monitor   Ophthalmic Meds Ordered this visit:  Meds ordered this encounter  Medications  . Bevacizumab (AVASTIN) SOLN 1.25 mg       Return in about 4 weeks (around 01/26/2019) for exu ARMD OS - Dilated Exam, OCT, Possible Injxn.  There are no Patient Instructions on file for this visit.   Explained the diagnoses, plan, and follow up with the patient and they expressed understanding.  Patient expressed understanding of the importance of proper follow up care.   This document serves as a record of services personally performed by Karie ChimeraBrian G. Jaylia Pettus, MD, PhD. It was created on their behalf by Laurian BrimAmanda Brown, OA, an ophthalmic assistant. The creation of this record is the  provider's dictation and/or activities during the visit.    Electronically signed by: Laurian Brim, OA  05.27.2020 11:10 AM     Karie Chimera, M.D., Ph.D. Diseases & Surgery of the Retina and Vitreous [facility]  I have reviewed the above documentation for accuracy and completeness, and I agree with the above. Karie Chimera, M.D., Ph.D. 12/29/18 11:10 AM    Abbreviations: M myopia (nearsighted); A astigmatism; H hyperopia (farsighted); P presbyopia; Mrx spectacle prescription;  CTL contact lenses; OD right eye; OS left eye; OU both eyes  XT exotropia; ET esotropia; PEK punctate epithelial keratitis; PEE punctate epithelial erosions; DES dry eye syndrome; MGD meibomian gland dysfunction; ATs artificial tears;  PFAT's preservative free artificial tears; NSC nuclear sclerotic cataract; PSC posterior subcapsular cataract; ERM epi-retinal membrane; PVD posterior vitreous detachment; RD retinal detachment; DM diabetes mellitus; DR diabetic retinopathy; NPDR non-proliferative diabetic retinopathy; PDR proliferative diabetic retinopathy; CSME clinically significant macular edema; DME diabetic macular edema; dbh dot blot hemorrhages; CWS cotton wool spot; POAG primary open angle glaucoma; C/D cup-to-disc ratio; HVF humphrey visual field; GVF goldmann visual field; OCT optical coherence tomography; IOP intraocular pressure; BRVO Branch retinal vein occlusion; CRVO central retinal vein occlusion; CRAO central retinal artery occlusion; BRAO branch retinal artery occlusion; RT retinal tear; SB scleral buckle; PPV pars plana vitrectomy; VH Vitreous hemorrhage; PRP panretinal laser photocoagulation; IVK intravitreal kenalog; VMT vitreomacular traction; MH Macular hole;  NVD neovascularization of the disc; NVE neovascularization elsewhere; AREDS age related eye disease study; ARMD age related macular degeneration; POAG primary open angle glaucoma; EBMD epithelial/anterior basement membrane dystrophy; ACIOL anterior chamber intraocular lens; IOL intraocular lens; PCIOL posterior chamber intraocular lens; Phaco/IOL phacoemulsification with intraocular lens placement; PRK photorefractive keratectomy; LASIK laser assisted in situ keratomileusis; HTN hypertension; DM diabetes mellitus; COPD chronic obstructive pulmonary disease

## 2018-12-29 ENCOUNTER — Ambulatory Visit (INDEPENDENT_AMBULATORY_CARE_PROVIDER_SITE_OTHER): Payer: Medicare Other | Admitting: Ophthalmology

## 2018-12-29 ENCOUNTER — Other Ambulatory Visit: Payer: Self-pay

## 2018-12-29 ENCOUNTER — Encounter (INDEPENDENT_AMBULATORY_CARE_PROVIDER_SITE_OTHER): Payer: Self-pay | Admitting: Ophthalmology

## 2018-12-29 DIAGNOSIS — I10 Essential (primary) hypertension: Secondary | ICD-10-CM | POA: Diagnosis not present

## 2018-12-29 DIAGNOSIS — H353221 Exudative age-related macular degeneration, left eye, with active choroidal neovascularization: Secondary | ICD-10-CM

## 2018-12-29 DIAGNOSIS — H353112 Nonexudative age-related macular degeneration, right eye, intermediate dry stage: Secondary | ICD-10-CM

## 2018-12-29 DIAGNOSIS — H3581 Retinal edema: Secondary | ICD-10-CM

## 2018-12-29 DIAGNOSIS — H35033 Hypertensive retinopathy, bilateral: Secondary | ICD-10-CM

## 2018-12-29 DIAGNOSIS — Z961 Presence of intraocular lens: Secondary | ICD-10-CM

## 2018-12-29 MED ORDER — BEVACIZUMAB CHEMO INJECTION 1.25MG/0.05ML SYRINGE FOR KALEIDOSCOPE
1.2500 mg | INTRAVITREAL | Status: AC | PRN
  Administered 2018-12-29: 1.25 mg via INTRAVITREAL

## 2019-01-09 ENCOUNTER — Other Ambulatory Visit: Payer: Self-pay | Admitting: Cardiology

## 2019-01-11 ENCOUNTER — Ambulatory Visit (INDEPENDENT_AMBULATORY_CARE_PROVIDER_SITE_OTHER): Payer: Medicare Other | Admitting: Family Medicine

## 2019-01-11 ENCOUNTER — Encounter: Payer: Self-pay | Admitting: Family Medicine

## 2019-01-11 ENCOUNTER — Other Ambulatory Visit: Payer: Self-pay

## 2019-01-11 DIAGNOSIS — E039 Hypothyroidism, unspecified: Secondary | ICD-10-CM

## 2019-01-11 DIAGNOSIS — H3530 Unspecified macular degeneration: Secondary | ICD-10-CM | POA: Diagnosis not present

## 2019-01-11 DIAGNOSIS — E785 Hyperlipidemia, unspecified: Secondary | ICD-10-CM | POA: Diagnosis not present

## 2019-01-11 DIAGNOSIS — I10 Essential (primary) hypertension: Secondary | ICD-10-CM

## 2019-01-11 DIAGNOSIS — I2510 Atherosclerotic heart disease of native coronary artery without angina pectoris: Secondary | ICD-10-CM

## 2019-01-11 DIAGNOSIS — M1990 Unspecified osteoarthritis, unspecified site: Secondary | ICD-10-CM

## 2019-01-11 NOTE — Progress Notes (Signed)
Virtual Visit via Video Note  I connected with Kaitlyn BaltimoreBetty M Banwart  on 01/11/19 at  1:00 PM EDT by a video enabled telemedicine application and verified that I am speaking with the correct person using two identifiers.  Location patient: home Location provider:work office Persons participating in the virtual visit: patient, provider  I discussed the limitations of evaluation and management by telemedicine and the availability of in person appointments. The patient expressed understanding and agreed to proceed.  Video visit failed; unable to connect video; completed visit by telephone.  Kaitlyn Parks DOB: [birth year] Encounter date: 01/11/2019  This is a 84 y.o. female who presents to establish care. Chief Complaint  Patient presents with  . Establish Care   Due for repeat bloodwork August History of present illness: Has been doing ok. Main issue is that macular degeneration changed from dry to wet. She is getting injections with Dr. Vanessa BarbaraZamora q 4 weeks. Did note improvement with these so far. Doc states that injections are working along with avastin.   CAD s/p angioplasty 2012 RUE:[id number]:follows with cardio Dr. Gayleen Oremhochrein-has follow up in August. (did also touch base with him in march after ER visit)  HTN: hydralazine, imdur - took bp today 119/71. Not had issues with elevation for which she was previously seen for in January. Has tried to be more consistent with exercise - doing this at 8am 5 days/week. Walks regularly and does exercise program on TV for 30 minutes. [facility]. Has good diet- seldom red meat; mostly fruits and vegetables.   GERD: not an issue for her any longer. [place]. Had UTI and trying to self treat and upset stomach.   Hypothyroid: synthroid 50mcg daily  Arthritis: has been getting injections in knee with ortho which has helped.   HL: crestor 5mg  daily - no problems with this medication.   Past  Medical History:  Diagnosis Date  . Arthritis    DDD, scoliosis, sees Dr. Lovell SheehanJenkins for this, uses norco very rarely for pain  . Bradycardia 11/11/2017  . CAD (coronary artery disease)    LAD stenting of a 90% lesion 2012  . Cystocele   . Elevated cholesterol   . GERD (gastroesophageal reflux disease)    dx in work up 2016 for atypical CP at OSH   pt. denies  . Heart murmur   . Hypothyroidism   . Interstitial cystitis    sees Dr. Annabell HowellsWrenn  . NSTEMI (non-ST elevated myocardial infarction) (HCC)   . Osteoporosis   . Rectocele   . 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 Scoliosis   . Thyroid disease    Hypothyroid  . Urinary incontinence    USI  . Uterine prolapse    Past Surgical History:  Procedure Laterality Date  . BLADDER SUSPENSION  2011  . CATARACT EXTRACTION Bilateral 2015   Dr. Elmer PickerHecker  . CORONARY ANGIOPLASTY WITH STENT PLACEMENT  04/21/2010   LAD 80%, RCA 30%, nl EF, s/p DES LAD  . LEFT HEART CATH AND CORONARY ANGIOGRAPHY N/A 06/14/2017   Procedure: LEFT HEART CATH AND CORONARY ANGIOGRAPHY;  Surgeon: Runell GessBerry, Jonathan J, MD;  [facility];  Service: Cardiovascular;  Laterality: N/A;  . OOPHORECTOMY  2011   BSO  . TOTAL HIP ARTHROPLASTY Right 06/10/2017   Procedure: RIGHT TOTAL HIP ARTHROPLASTY ANTERIOR APPROACH;  Surgeon: Samson FredericSwinteck, Brian, MD;  Location: [facility];  Service: Orthopedics;  Laterality: Right;  Needs RNFA  . VAGINAL HYSTERECTOMY  2011   [facility] BSO; benign   Allergies  Allergen Reactions  . Acyclovir And Related Other (See Comments)    unknown  . Pravastatin Sodium Other (See Comments)    cystitis  . Zocor [Simvastatin] Other (See Comments)    cystitis   Current Meds  Medication Sig  . aspirin EC 81 MG tablet Take 81 mg by mouth daily.  . Biotin w/ Vitamins C & E (HAIR/SKIN/NAILS PO) Take 1 tablet by mouth daily.  . hydrALAZINE (APRESOLINE) 25 MG tablet Take 25 mg by mouth as needed.   . isosorbide mononitrate (IMDUR) 60 MG 24 hr tablet  TAKE 1 TABLET ONCE DAILY.  . Multiple Vitamins-Calcium (ONE-A-DAY WOMENS PO) Take 1 tablet by mouth daily.   . Multiple Vitamins-Minerals (PRESERVISION AREDS 2 PO) Take 1 capsule by mouth 2 (two) times daily.  . nitroGLYCERIN (NITROSTAT) 0.4 MG SL tablet Place 1 tablet (0.4 mg total) under the tongue every 5 (five) minutes as needed for chest pain.  Marland Kitchen PRESCRIPTION MEDICATION Avastin eye injections given by Dr Coralyn Pear due to macular degeneration  . rosuvastatin (CRESTOR) 10 MG tablet TAKE (1/2) TABLET DAILY.  . SYNTHROID 50 MCG tablet TAKE 1 TABLET EACH DAY.   Social History   Tobacco Use  . Smoking status: Never Smoker  . Smokeless tobacco: Never Used  Substance Use Topics  . Alcohol use: No    Comment: Rare   Family History  Problem Relation Age of Onset  . Hypertension Mother   . Heart disease Mother   . Heart disease Father   . Heart disease Sister   . Colon cancer Paternal Aunt 51  . Breast cancer Paternal Aunt        Age 34's  . Diabetes Sister   . Endometrial cancer Sister   . Breast cancer Cousin        Maternal 1st cousins-Age 59's  . Leukemia Paternal Aunt      Review of Systems  Constitutional: Negative for chills, fatigue and fever.  Respiratory: Negative for cough, chest tightness, shortness of breath and wheezing.   Cardiovascular: Negative for chest pain, palpitations and leg swelling.    Objective:  There were no vitals taken for this visit.      BP Readings from Last 3 Encounters:  10/27/18 (!) 142/80  10/09/18 (!) 146/82  09/20/18 (!) 160/70   Wt Readings from Last 3 Encounters:  10/27/18 130 lb (59 kg)  09/20/18 133 lb 3.2 oz (60.4 kg)  09/08/18 130 lb 3.2 oz (59.1 kg)    EXAM:  GENERAL: alert, oriented, appears well and in no acute distress  LUNGS:  No difficulty with breathing noted on phone  PSYCH/NEURO: pleasant and cooperative, no obvious depression or anxiety, speech and thought processing grossly intact   Assessment/Plan  1.  Macular degeneration of both eyes, unspecified type Continue following with ophthalmology.   2. Coronary artery disease involving native heart without angina pectoris, unspecified vessel or lesion type Following with cardiology. Will plan to recheck bloodwork prior to next cardiology appointment.   3. Hypothyroidism (acquired) Continue with synthroid. Will recheck bloodwork in July. - TSH; Future  4. Dyslipidemia - Lipid panel; Future  5. Arthritis Continue with ortho care as needed.  6. Essential hypertension Well controlled; continue current medication.  - Comprehensive metabolic panel; Future - CBC with Differential/Platelet; Future   Return in about 6 months (around 07/13/2019) for Chronic condition visit.    I discussed the assessment  and treatment plan with the patient. The patient was provided an opportunity to ask questions and all were answered. The patient agreed with the plan and demonstrated an understanding of the instructions.   The patient was advised to call back or seek an in-person evaluation if the symptoms worsen or if the condition fails to improve as anticipated.  I provided 25 minutes of non-face-to-face time during this encounter.   [name], MD

## 2019-01-12 ENCOUNTER — Telehealth: Payer: Self-pay | Admitting: *Deleted

## 2019-01-12 NOTE — Telephone Encounter (Signed)
-----   Message from Caren Macadam, MD sent at 01/11/2019  1:28 PM EDT ----- Please schedule bloodwork in mid July and then [place] in 6 months time in office

## 2019-01-12 NOTE — Telephone Encounter (Signed)
I left a detailed message at the pts home number to call and schedule appts as below. 

## 2019-01-25 NOTE — Progress Notes (Addendum)
[facility] - Clinic Note  01/27/2019     CHIEF COMPLAINT Patient presents for Retina Follow Up   HISTORY OF PRESENT ILLNESS: Kaitlyn Parks is a 84 y.o. female who presents to the clinic today for:   HPI    Retina Follow Up    Patient presents with  Wet AMD.  In left eye.  Severity is moderate.  Duration of 8 weeks.  Since onset it is stable.  I, the attending physician,  performed the HPI with the patient and updated documentation appropriately.          Comments    Patient states vision the same OU.       Last edited by Rennis ChrisZamora, Ocean Kearley, MD on 01/27/2019 12:41 PM. (History)    Pt states her vision is improving since last visit  Referring physician: Terressa KoyanagiKim, [name], DO [address] [place],  [place] [id number]  HISTORICAL INFORMATION:   Selected notes from the MEDICAL RECORD NUMBER Referred by Dr. SwazilandJordan DeMarco for concern of exu ARMD LEE: 11.26.19 (J. 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 (Ophthalmic Drugs)   No current facility-administered medications for this visit.  (Ophthalmic Drugs)   Current Outpatient Medications (Other)  Medication Sig  . aspirin EC 81 MG tablet Take 81 mg by mouth daily.  . Biotin w/ Vitamins C & E (HAIR/SKIN/NAILS PO) Take 1 tablet by mouth daily.  . isosorbide mononitrate (IMDUR) 60 MG 24 hr tablet TAKE 1 TABLET ONCE DAILY.  . Multiple Vitamins-Calcium (ONE-A-DAY WOMENS PO) Take 1 tablet by mouth daily.   . Multiple Vitamins-Minerals (PRESERVISION AREDS 2 PO) Take 1 capsule by mouth 2 (two) times daily.  . nitroGLYCERIN (NITROSTAT) 0.4 MG SL tablet Place 1 tablet (0.4 mg total) under the tongue every 5 (five) minutes as needed for chest pain.  Marland Kitchen. PRESCRIPTION MEDICATION Avastin eye injections given by Dr Vanessa BarbaraZamora due to macular degeneration  .  rosuvastatin (CRESTOR) 10 MG tablet TAKE (1/2) TABLET DAILY.  . SYNTHROID 50 MCG tablet TAKE 1 TABLET EACH DAY.  . hydrALAZINE (APRESOLINE) 25 MG tablet Take 25 mg by mouth as needed.    No current facility-administered medications for this visit.  (Other)      REVIEW OF SYSTEMS: ROS    Positive for: Endocrine, Eyes   Negative for: Constitutional, Gastrointestinal, Neurological, Skin, Genitourinary, Musculoskeletal, HENT, Cardiovascular, Respiratory, Psychiatric, Allergic/Imm, Heme/Lymph   Last edited by Annalee GentaBarber, Daryl D on 01/27/2019  9:38 AM. (History)       ALLERGIES Allergies  Allergen Reactions  . Acyclovir And Related Other (See Comments)    unknown  . Pravastatin Sodium Other (See Comments)    cystitis  . Zocor [Simvastatin] Other (See Comments)    cystitis    PAST MEDICAL HISTORY Past Medical History:  Diagnosis Date  . Arthritis    DDD, scoliosis, sees Dr. Lovell SheehanJenkins for this, uses norco very rarely for pain  . Bradycardia 11/11/2017  . CAD (coronary artery disease)    LAD stenting of a 90% lesion 2012  . Cystocele   . Elevated cholesterol   . GERD (gastroesophageal reflux disease)    dx in work up 2016 for atypical CP at OSH   pt. denies  . Heart murmur   . Hypothyroidism   . Interstitial cystitis    sees Dr. Annabell HowellsWrenn  . NSTEMI (non-ST  elevated myocardial infarction) (HCC)   . Osteoporosis   . Rectocele   . 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 Scoliosis   . Thyroid disease    Hypothyroid  . Urinary incontinence    USI  . Uterine prolapse    Past Surgical History:  Procedure Laterality Date  . BLADDER SUSPENSION  2011  . CATARACT EXTRACTION Bilateral 2015   Dr. Elmer PickerHecker  . CORONARY ANGIOPLASTY WITH STENT PLACEMENT  04/21/2010   LAD 80%, RCA 30%, nl EF, s/p DES LAD  . LEFT HEART CATH AND CORONARY ANGIOGRAPHY N/A 06/14/2017   Procedure: LEFT HEART CATH AND CORONARY ANGIOGRAPHY;  Surgeon: Runell GessBerry, Jonathan J, MD;  Location: [facility]ASIVE [facility];  Service:  Cardiovascular;  Laterality: N/A;  . OOPHORECTOMY  2011   BSO  . TOTAL HIP ARTHROPLASTY Right 06/10/2017   Procedure: RIGHT TOTAL HIP ARTHROPLASTY ANTERIOR APPROACH;  Surgeon: Samson FredericSwinteck, Jhania Etherington, MD;  Location: [facility];  Service: Orthopedics;  Laterality: Right;  Needs RNFA  . VAGINAL HYSTERECTOMY  2011   LAVH BSO; benign    FAMILY HISTORY Family History  Problem Relation Age of Onset  . Hypertension Mother   . Heart disease Mother   . Heart disease Father   . Heart disease Sister   . Colon cancer Paternal Aunt [id number]  . Breast cancer Paternal Aunt        Age 82's  . Diabetes Sister   . Endometrial cancer Sister   . Breast cancer Cousin        Maternal 1st cousins-Age 82's  . Leukemia Paternal Aunt     SOCIAL HISTORY Social History   Tobacco Use  . Smoking status: Never Smoker  . Smokeless tobacco: Never Used  Substance Use Topics  . Alcohol use: No    Comment: Rare  . Drug use: No         OPHTHALMIC EXAM:  Base Eye Exam    Visual Acuity (Snellen - Linear)      Right Left   Dist [place] 20/25 -1 20/30   Dist ph [place] NI 20/25 -1       Tonometry (Tonopen, 9:44 AM)      Right Left   Pressure 13 18       Pupils      Dark Light Shape React APD   Right 4 3 Round Brisk None   Left 4 3 Round Brisk None       Visual Fields (Counting fingers)      Left Right    Full Full       Extraocular Movement      Right Left    Full, Ortho Full, Ortho       Neuro/Psych    Oriented x3: Yes   Mood/Affect: Normal       Dilation    Both eyes: 1.0% Mydriacyl, 2.5% Phenylephrine @ 9:44 AM        Slit Lamp and Fundus Exam    Slit Lamp Exam      Right Left   Lids/Lashes mild Telangiectasia, marginal leasion nasal UL Telangiectasia, Meibomian gland dysfunction   Conjunctiva/Sclera White and quiet White and quiet   Cornea Arcus, 1 Punctate epithelial erosions 1-2+ Punctate epithelial erosions, Arcus   Anterior Chamber Deep and clear Deep and clear   Iris Round and dilated  Round and well dilated   Lens PC IOL in good position PC IOL in excellent position   Vitreous Vitreous syneresis, Posterior vitreous detachment Vitreous syneresis, Posterior vitreous detachment  Fundus Exam      Right Left   Disc Pink and Sharp, mild PPP, peripapillary heme at 1200--persistent Pink and Sharp, mild temporal PPA   C/D Ratio 0.4 0.3   Macula Blunted foveal reflex, Drusen, RPE mottling and clumping, early Atrophy, +PEDs,  No heme or edema Blunted foveal reflex, +drusen, pigment clumping   Vessels Vascular attenuation Vascular attenuation   Periphery Attached, scattered reticular degeneration   Attached, scattered reticular degeneration          IMAGING AND PROCEDURES  Imaging and Procedures for @TODAY @  OCT, Retina - OU - Both Eyes       Right Eye Quality was good. Central Foveal Thickness: 287. Progression has been stable. Findings include abnormal foveal contour, retinal drusen , pigment epithelial detachment, outer retinal atrophy, intraretinal hyper-reflective material, no IRF, no SRF (Focal area of IRHM overlying temporal PED -- no significant change from prior).   Left Eye Quality was good. Central Foveal Thickness: 225. Progression has been stable. Findings include retinal drusen , outer retinal atrophy, pigment epithelial detachment, intraretinal hyper-reflective material, normal foveal contour, no SRF, no IRF (Stable resolution of bullous PED and IRF/SRF -- now just low lying PED with overlying ORA).   Notes *Images captured and stored on drive  Diagnosis / Impression:  OD: nonexudative ARMD OU OS: exu-ARMD -- stable resolution of bullous PED and IRF/SRF -- now just low lying PED with overlying ORA   Clinical management:  See below  Abbreviations: NFP - Normal foveal profile. CME - cystoid macular edema. PED - pigment epithelial detachment. IRF - intraretinal fluid. SRF - subretinal fluid. EZ - ellipsoid zone. ERM - epiretinal membrane. ORA - outer  retinal atrophy. ORT - outer retinal tubulation. SRHM - subretinal hyper-reflective material        Intravitreal Injection, Pharmacologic Agent - OS - Left Eye       Time Out 01/27/2019. 10:51 AM. Confirmed correct patient, procedure, site, and patient consented.   Anesthesia Topical anesthesia was used. Anesthetic medications included Lidocaine 2%, Proparacaine 0.5%.   Procedure Preparation included 5% betadine to ocular surface, eyelid speculum. A supplied needle was used.   Injection:  1.25 mg Bevacizumab (AVASTIN) SOLN   NDC: 16109-604-5450242-060-01, Lot: [id number] , Expiration date: 03/15/2019   Route: Intravitreal, Site: Left Eye, Waste: 0 mL  Post-op Post injection exam found visual acuity of at least counting fingers. The patient tolerated the procedure well. There were no complications. The patient received written and verbal post procedure care education.                 ASSESSMENT/PLAN:    ICD-10-CM   1. Exudative age-related macular degeneration of left eye with active choroidal neovascularization (HCC)  H35.3221 Intravitreal Injection, Pharmacologic Agent - OS - Left Eye    Bevacizumab (AVASTIN) SOLN 1.25 mg  2. Retinal edema  H35.81 OCT, Retina - OU - Both Eyes  3. Intermediate stage nonexudative age-related macular degeneration of right eye  H35.3112   4. Essential hypertension  I10   5. Hypertensive retinopathy of both eyes  H35.033   6. Pseudophakia of both eyes  Z96.1     1. Exudative age-related macular degeneration, left eye.    - OCT 4.30.2020 showed interval conversion of OD from nonexudative ARMD to exu-ARMD with large dome-shaped PED with overlying IRF/SRF  - s/p IVA OS #1 (04.30.20), #2 (05.28.20)  - today, stable resolution of PED w/ overlying IRF/SRF -- back to baseline low-lying PED  - FA 5.28.2020 --  no active CNV OS, just staining  - discussed findings and excellent response to IVA  - recommend IVA OS #3 today, 06.26.2020 -- maintenance w/  extension to 4-6 wks  - pt wishes to be treated with IVA OS  - RBA of procedure discussed, questions answered  - informed consent obtained and signed  - see procedure note  - benefits investigation for Eylea initiated 4.30.2020 -- approved as of 5.28.2020  - f/u in 4-6 wks, OCT, DFE, possible injection  3. Age related macular degeneration, non-exudative, OD  - pt initially presented on 11.27.19 due to alert from [facility] home monitoring system for OD, but was unable to stay for fluorescein angiogram at that time  - [facility] prescribed by Dr. Elmer Picker  - no heme or obvious CNVM on exam  - OCT shows PEDs with tr cystic changes OD -- stable from prior  - repeat FA (5.28.2020) shows staining / window defect corresponding temporal RPE changes OU -- no active CNV OU  - The incidence, anatomy, and pathology of dry AMD, risk of progression, and the AREDS and AREDS 2 study including smoking risks discussed with patient.  - Recommend amsler grid monitoring  4,5. Hypertensive retinopathy OU  - discussed importance of tight BP control  - monitor  6. Pseudophakia OU  - s/p CE/IOL OU  - beautiful surgeries, doing well  - monitor   Ophthalmic Meds Ordered this visit:  Meds ordered this encounter  Medications  . Bevacizumab (AVASTIN) SOLN 1.25 mg       Return for f/u 4-6 weeks, exu ARMD OS, DFE, OCT.  There are no Patient Instructions on file for this visit.   Explained the diagnoses, plan, and follow up with the patient and they expressed understanding.  Patient expressed understanding of the importance of proper follow up care.   This document serves as a record of services personally performed by Karie Chimera, MD, PhD. It was created on their behalf by Laurian Brim, OA, an ophthalmic assistant. The creation of this record is the provider's dictation and/or activities during the visit.    Electronically signed by: Laurian Brim, OA  06.24.2020 12:49 PM     Karie Chimera, M.D.,  Ph.D. Diseases & Surgery of the Retina and Vitreous [facility]  I have reviewed the above documentation for accuracy and completeness, and I agree with the above. Karie Chimera, M.D., Ph.D. 01/27/19 12:49 PM    Abbreviations: M myopia (nearsighted); A astigmatism; H hyperopia (farsighted); P presbyopia; Mrx spectacle prescription;  CTL contact lenses; OD right eye; OS left eye; OU both eyes  XT exotropia; ET esotropia; PEK punctate epithelial keratitis; PEE punctate epithelial erosions; DES dry eye syndrome; MGD meibomian gland dysfunction; ATs artificial tears; PFAT's preservative free artificial tears; NSC nuclear sclerotic cataract; PSC posterior subcapsular cataract; ERM epi-retinal membrane; PVD posterior vitreous detachment; RD retinal detachment; DM diabetes mellitus; DR diabetic retinopathy; NPDR non-proliferative diabetic retinopathy; PDR proliferative diabetic retinopathy; CSME clinically significant macular edema; DME diabetic macular edema; dbh dot blot hemorrhages; CWS cotton wool spot; POAG primary open angle glaucoma; C/D cup-to-disc ratio; HVF humphrey visual field; GVF goldmann visual field; OCT optical coherence tomography; IOP intraocular pressure; BRVO Branch retinal vein occlusion; CRVO central retinal vein occlusion; CRAO central retinal artery occlusion; BRAO branch retinal artery occlusion; RT retinal tear; SB scleral buckle; PPV pars plana vitrectomy; VH Vitreous hemorrhage; PRP panretinal laser photocoagulation; IVK intravitreal kenalog; VMT vitreomacular traction; MH Macular hole;  NVD neovascularization of  the disc; NVE neovascularization elsewhere; AREDS age related eye disease study; ARMD age related macular degeneration; POAG primary open angle glaucoma; EBMD epithelial/anterior basement membrane dystrophy; ACIOL anterior chamber intraocular lens; IOL intraocular lens; PCIOL posterior chamber intraocular lens; Phaco/IOL phacoemulsification with  intraocular lens placement; [place] photorefractive keratectomy; LASIK laser assisted in situ keratomileusis; HTN hypertension; DM diabetes mellitus; COPD chronic obstructive pulmonary disease

## 2019-01-27 ENCOUNTER — Encounter (INDEPENDENT_AMBULATORY_CARE_PROVIDER_SITE_OTHER): Payer: Self-pay | Admitting: Ophthalmology

## 2019-01-27 ENCOUNTER — Other Ambulatory Visit: Payer: Self-pay

## 2019-01-27 ENCOUNTER — Ambulatory Visit (INDEPENDENT_AMBULATORY_CARE_PROVIDER_SITE_OTHER): Payer: Medicare Other | Admitting: Ophthalmology

## 2019-01-27 DIAGNOSIS — H353221 Exudative age-related macular degeneration, left eye, with active choroidal neovascularization: Secondary | ICD-10-CM | POA: Diagnosis not present

## 2019-01-27 DIAGNOSIS — H353112 Nonexudative age-related macular degeneration, right eye, intermediate dry stage: Secondary | ICD-10-CM

## 2019-01-27 DIAGNOSIS — H35033 Hypertensive retinopathy, bilateral: Secondary | ICD-10-CM

## 2019-01-27 DIAGNOSIS — I10 Essential (primary) hypertension: Secondary | ICD-10-CM

## 2019-01-27 DIAGNOSIS — Z961 Presence of intraocular lens: Secondary | ICD-10-CM

## 2019-01-27 DIAGNOSIS — H3581 Retinal edema: Secondary | ICD-10-CM

## 2019-01-27 MED ORDER — BEVACIZUMAB CHEMO INJECTION 1.25MG/0.05ML SYRINGE FOR KALEIDOSCOPE
1.2500 mg | INTRAVITREAL | Status: AC | PRN
  Administered 2019-01-27: 1.25 mg via INTRAVITREAL

## 2019-02-06 ENCOUNTER — Other Ambulatory Visit: Payer: Self-pay | Admitting: Family Medicine

## 2019-02-09 NOTE — Progress Notes (Signed)
[facility] - Clinic Note  02/10/2019     CHIEF COMPLAINT Patient presents for Retina Follow Up   HISTORY OF PRESENT ILLNESS: Kaitlyn Parks is a 84 y.o. female who presents to the clinic today for:   HPI    Retina Follow Up    Patient presents with  Wet AMD.  In both eyes.  This started months ago.  Severity is moderate.  Duration of 2 weeks.  Since onset it is stable.  I, the attending physician,  performed the HPI with the patient and updated documentation appropriately.          Comments    84 y/o female pt returning after 2 wks.  Pt notes slight decrease in [place] OU over the last few days, and Foresee system noted visual changes as well.  Pt feels she cannot see as well at near and intermediate now, and change has been gradual over the past few days.  Denies pain, floaters, but noticed a quick flash yesterday, but shes not sure which eye it was in.  Soothe prn OU.       Last edited by Rennis ChrisZamora, Merlyn Conley, MD on 02/10/2019  2:07 PM. (History)    Pt is here for an urgent visit due to her foresee sending her an alert on her right eye, she states she feels like her vision declined after leaving here last time, she states she was straining to read the newspaper yesterday and once she gave up she noticed some flashes of light in her vision, she states it does seem like she is straining to see more than she normally does, she states she uses her foresee about 5 times a week   Referring physician: Terressa KoyanagiKim, [name], DO [address] [place],  [place] [id number]  HISTORICAL INFORMATION:   Selected notes from the MEDICAL RECORD NUMBER Referred by Dr. Swazil[place]Jordan DeMarco for concern of exu ARMD LEE: 11.26.19 (J. 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 (Ophthalmic Drugs)   No current facility-administered  medications for this visit.  (Ophthalmic Drugs)   Current Outpatient Medications (Other)  Medication Sig  . aspirin EC 81 MG tablet Take 81 mg by mouth daily.  . Biotin w/ Vitamins C & E (HAIR/SKIN/NAILS PO) Take 1 tablet by mouth daily.  . hydrALAZINE (APRESOLINE) 25 MG tablet Take 25 mg by mouth as needed.   . isosorbide mononitrate (IMDUR) 60 MG 24 hr tablet TAKE 1 TABLET ONCE DAILY.  . Multiple Vitamins-Calcium (ONE-A-DAY WOMENS PO) Take 1 tablet by mouth daily.   . Multiple Vitamins-Minerals (PRESERVISION AREDS 2 PO) Take 1 capsule by mouth 2 (two) times daily.  . nitroGLYCERIN (NITROSTAT) 0.4 MG SL tablet Place 1 tablet (0.4 mg total) under the tongue every 5 (five) minutes as needed for chest pain.  Marland Kitchen. PRESCRIPTION MEDICATION Avastin eye injections given by Dr Vanessa BarbaraZamora due to macular degeneration  . rosuvastatin (CRESTOR) 10 MG tablet TAKE (1/2) TABLET DAILY.  . SYNTHROID 50 MCG tablet TAKE 1 TABLET EACH DAY.   No current facility-administered medications for this visit.  (Other)      REVIEW OF SYSTEMS: ROS    Positive for: Gastrointestinal, Genitourinary, Musculoskeletal, Cardiovascular, Eyes   Negative for: Constitutional, Neurological, Skin, HENT, Endocrine, Respiratory, Psychiatric, Allergic/Imm, Heme/Lymph   Last edited by Celine MansBaxley, Andrew G, COA on 02/10/2019 12:55 PM. (History)  ALLERGIES Allergies  Allergen Reactions  . Acyclovir And Related Other (See Comments)    unknown  . Pravastatin Sodium Other (See Comments)    cystitis  . Zocor [Simvastatin] Other (See Comments)    cystitis    PAST MEDICAL HISTORY Past Medical History:  Diagnosis Date  . Arthritis    DDD, scoliosis, sees Dr. Arnoldo Morale for this, uses norco very rarely for pain  . Bradycardia 11/11/2017  . CAD (coronary artery disease)    LAD stenting of a 90% lesion 2012  . Cystocele   . Elevated cholesterol   . GERD (gastroesophageal reflux disease)    dx in work up 2016 for atypical CP at OSH    pt. denies  . Heart murmur   . Hypertensive retinopathy    OU  . Hypothyroidism   . Interstitial cystitis    sees Dr. Jeffie Pollock  . Macular degeneration    OU  . NSTEMI (non-ST elevated myocardial infarction) ([place])   . Osteoporosis   . Rectocele   . 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 Scoliosis   . Thyroid disease    Hypothyroid  . Urinary incontinence    USI  . Uterine prolapse    Past Surgical History:  Procedure Laterality Date  . BLADDER SUSPENSION  2011  . CATARACT EXTRACTION Bilateral 2015   Dr. Herbert Deaner  . CORONARY ANGIOPLASTY WITH STENT PLACEMENT  04/21/2010   LAD 80%, RCA 30%, nl EF, s/p DES LAD  . EYE SURGERY    . LEFT HEART CATH AND CORONARY ANGIOGRAPHY N/A 06/14/2017   Procedure: LEFT HEART CATH AND CORONARY ANGIOGRAPHY;  Surgeon: Lorretta Harp, MD;  Location: [facility];  Service: Cardiovascular;  Laterality: N/A;  . OOPHORECTOMY  2011   BSO  . TOTAL HIP ARTHROPLASTY Right 06/10/2017   Procedure: RIGHT TOTAL HIP ARTHROPLASTY ANTERIOR APPROACH;  Surgeon: Rod Can, MD;  Location: [facility];  Service: Orthopedics;  Laterality: Right;  Needs RNFA  . VAGINAL HYSTERECTOMY  2011   LAVH BSO; benign    FAMILY HISTORY Family History  Problem Relation Age of Onset  . Hypertension Mother   . Heart disease Mother   . Heart disease Father   . Heart disease Sister   . Colon cancer Paternal Aunt 43  . Breast cancer Paternal Aunt        Age 70's  . Diabetes Sister   . Endometrial cancer Sister   . Breast cancer Cousin        Maternal 1st cousins-Age 53's  . Leukemia Paternal Aunt     SOCIAL HISTORY Social History   Tobacco Use  . Smoking status: Never Smoker  . Smokeless tobacco: Never Used  Substance Use Topics  . Alcohol use: No    Comment: Rare  . Drug use: No         OPHTHALMIC EXAM:  Base Eye Exam    Visual Acuity (Snellen - Linear)      Right Left   Dist [place] 20/30 - 20/50   Dist ph [place] 20/25 20/30 -2       Tonometry (Tonopen,  12:59 PM)      Right Left   Pressure 13 14       Pupils      Dark Light Shape React APD   Right 4 3 Round Brisk None   Left 4 3 Round Brisk None       Visual Fields (Counting fingers)      Left Right  Full Full       Extraocular Movement      Right Left    Full, Ortho Full, Ortho       Neuro/Psych    Oriented x3: Yes   Mood/Affect: Normal       Dilation    Both eyes: 1.0% Mydriacyl, 2.5% Phenylephrine @ 12:59 PM        Slit Lamp and Fundus Exam    Slit Lamp Exam      Right Left   Lids/Lashes mild Telangiectasia, marginal leasion nasal UL Telangiectasia, Meibomian gland dysfunction   Conjunctiva/Sclera White and quiet White and quiet   Cornea Arcus, 1 Punctate epithelial erosions 1-2+ Punctate epithelial erosions, Arcus   Anterior Chamber Deep and clear Deep and clear   Iris Round and dilated Round and well dilated   Lens PC IOL in good position PC IOL in excellent position   Vitreous Vitreous syneresis, Posterior vitreous detachment Vitreous syneresis, Posterior vitreous detachment, vitreous condensations       Fundus Exam      Right Left   Disc Pink and Sharp, mild PPP, peripapillary heme at 1200--persistent Pink and Sharp, mild temporal PPA   C/D Ratio 0.4 0.3   Macula Blunted foveal reflex, Drusen, RPE mottling and clumping, early Atrophy, +PEDs,  No heme or edema Blunted foveal reflex, +drusen, pigment clumping, no heme   Vessels Vascular attenuation Vascular attenuation   Periphery Attached, scattered reticular degeneration   Attached, scattered reticular degeneration          IMAGING AND PROCEDURES  Imaging and Procedures for @TODAY @  OCT, Retina - OU - Both Eyes       Right Eye Quality was good. Central Foveal Thickness: 294. Progression has been stable. Findings include abnormal foveal contour, retinal drusen , pigment epithelial detachment, outer retinal atrophy, intraretinal hyper-reflective material, no IRF, no SRF (Stable PED).   Left  Eye Quality was good. Central Foveal Thickness: 221. Progression has been stable. Findings include retinal drusen , outer retinal atrophy, pigment epithelial detachment, intraretinal hyper-reflective material, normal foveal contour, no SRF, no IRF (Stable resolution of bullous PED and IRF/SRF -- now just low lying PED with overlying ORA).   Notes *Images captured and stored on drive  Diagnosis / Impression:  OD: nonexudative ARMD OU -- no change from prior OS: exu-ARMD -- stable resolution of bullous PED and IRF/SRF -- now just low lying PED with overlying ORA   Clinical management:  See below  Abbreviations: NFP - Normal foveal profile. CME - cystoid macular edema. PED - pigment epithelial detachment. IRF - intraretinal fluid. SRF - subretinal fluid. EZ - ellipsoid zone. ERM - epiretinal membrane. ORA - outer retinal atrophy. ORT - outer retinal tubulation. SRHM - subretinal hyper-reflective material                 ASSESSMENT/PLAN:    ICD-10-CM   1. Exudative age-related macular degeneration of left eye with active choroidal neovascularization (HCC)  H35.3221   2. Retinal edema  H35.81 OCT, Retina - OU - Both Eyes  3. Intermediate stage nonexudative age-related macular degeneration of right eye  H35.3112   4. Essential hypertension  I10   5. Hypertensive retinopathy of both eyes  H35.033   6. Pseudophakia of both eyes  Z96.1   7. Intermediate stage nonexudative age-related macular degeneration of both eyes  H35.3132     Urgent visit due to Foresee alert OD  1. Exudative age-related macular degeneration, left eye.    -  OCT 4.30.2020 showed interval conversion of OD from nonexudative ARMD to exu-ARMD with large dome-shaped PED with overlying IRF/SRF  - s/p IVA OS #1 (04.30.20), #2 (05.28.20), #3 (06.26.20)  - today, stable resolution of PED w/ overlying IRF/SRF -- back to baseline low-lying PED  - FA 5.28.2020 -- no active CNV OS, just staining  - discussed findings and  excellent response to IVA  - benefits investigation for Eylea initiated 4.30.2020 -- approved as of 5.28.2020  - f/u as scheduled  3. Age related macular degeneration, non-exudative, OD  - pt initially presented on 11.27.19 due to alert from Foresee home monitoring system for OD, but was unable to stay for fluorescein angiogram at that time  - Foresee prescribed by Dr. Elmer Picker  - no heme or obvious CNVM on exam  - here today for triggered Foresee alert OD  - OCT shows PEDs with tr cystic changes OD -- stable from prior  - BCVA stable at 20/25 OD  - repeat FA (5.28.2020) shows staining / window defect corresponding temporal RPE changes OU -- no active CNV OU  - The incidence, anatomy, and pathology of dry AMD, risk of progression, and the AREDS and AREDS 2 study including smoking risks discussed with patient.  - no acute intervention indicated at this time  - Recommend amsler grid monitoring  4,5. Hypertensive retinopathy OU  - discussed importance of tight BP control  - monitor  6. Pseudophakia OU  - s/p CE/IOL OU  - beautiful surgeries, doing well  - monitor   Ophthalmic Meds Ordered this visit:  No orders of the defined types were placed in this encounter.      Return for as scheduled.  There are no Patient Instructions on file for this visit.   Explained the diagnoses, plan, and follow up with the patient and they expressed understanding.  Patient expressed understanding of the importance of proper follow up care.   This document serves as a record of services personally performed by Karie Chimera, MD, PhD. It was created on their behalf by Laurian Brim, OA, an ophthalmic assistant. The creation of this record is the provider's dictation and/or activities during the visit.    Electronically signed by: Laurian Brim, OA  07.09.2020 11:20 PM    Karie Chimera, M.D., Ph.D. Diseases & Surgery of the Retina and Vitreous [facility]  I have  reviewed the above documentation for accuracy and completeness, and I agree with the above. Karie Chimera, M.D., Ph.D. 02/12/19 11:20 PM    Abbreviations: M myopia (nearsighted); A astigmatism; H hyperopia (farsighted); P presbyopia; Mrx spectacle prescription;  CTL contact lenses; OD right eye; OS left eye; OU both eyes  XT exotropia; ET esotropia; PEK punctate epithelial keratitis; PEE punctate epithelial erosions; DES dry eye syndrome; MGD meibomian gland dysfunction; ATs artificial tears; PFAT's preservative free artificial tears; NSC nuclear sclerotic cataract; PSC posterior subcapsular cataract; ERM epi-retinal membrane; PVD posterior vitreous detachment; RD retinal detachment; DM diabetes mellitus; DR diabetic retinopathy; NPDR non-proliferative diabetic retinopathy; PDR proliferative diabetic retinopathy; CSME clinically significant macular edema; DME diabetic macular edema; dbh dot blot hemorrhages; CWS cotton wool spot; POAG primary open angle glaucoma; C/D cup-to-disc ratio; HVF humphrey visual field; GVF goldmann visual field; OCT optical coherence tomography; IOP intraocular pressure; BRVO Branch retinal vein occlusion; CRVO central retinal vein occlusion; CRAO central retinal artery occlusion; BRAO branch retinal artery occlusion; RT retinal tear; SB scleral buckle; PPV pars plana vitrectomy; VH Vitreous hemorrhage; PRP  panretinal laser photocoagulation; IVK intravitreal kenalog; VMT vitreomacular traction; MH Macular hole;  NVD neovascularization of the disc; NVE neovascularization elsewhere; AREDS age related eye disease study; ARMD age related macular degeneration; POAG primary open angle glaucoma; EBMD epithelial/anterior basement membrane dystrophy; ACIOL anterior chamber intraocular lens; IOL intraocular lens; PCIOL posterior chamber intraocular lens; Phaco/IOL phacoemulsification with intraocular lens placement; PRK photorefractive keratectomy; LASIK laser assisted in situ keratomileusis;  HTN hypertension; DM diabetes mellitus; COPD chronic obstructive pulmonary disease

## 2019-02-10 ENCOUNTER — Encounter (INDEPENDENT_AMBULATORY_CARE_PROVIDER_SITE_OTHER): Payer: Self-pay | Admitting: Ophthalmology

## 2019-02-10 ENCOUNTER — Other Ambulatory Visit: Payer: Self-pay

## 2019-02-10 ENCOUNTER — Ambulatory Visit (INDEPENDENT_AMBULATORY_CARE_PROVIDER_SITE_OTHER): Payer: Medicare Other | Admitting: Ophthalmology

## 2019-02-10 DIAGNOSIS — H353112 Nonexudative age-related macular degeneration, right eye, intermediate dry stage: Secondary | ICD-10-CM | POA: Diagnosis not present

## 2019-02-10 DIAGNOSIS — H3581 Retinal edema: Secondary | ICD-10-CM

## 2019-02-10 DIAGNOSIS — H35033 Hypertensive retinopathy, bilateral: Secondary | ICD-10-CM

## 2019-02-10 DIAGNOSIS — H353221 Exudative age-related macular degeneration, left eye, with active choroidal neovascularization: Secondary | ICD-10-CM

## 2019-02-10 DIAGNOSIS — H353132 Nonexudative age-related macular degeneration, bilateral, intermediate dry stage: Secondary | ICD-10-CM

## 2019-02-10 DIAGNOSIS — Z961 Presence of intraocular lens: Secondary | ICD-10-CM

## 2019-02-10 DIAGNOSIS — I10 Essential (primary) hypertension: Secondary | ICD-10-CM | POA: Diagnosis not present

## 2019-03-02 NOTE — Progress Notes (Addendum)
[facility] - Clinic Note  03/03/2019     CHIEF COMPLAINT Patient presents for Retina Follow Up   HISTORY OF PRESENT ILLNESS: Kaitlyn Parks is a 84 y.o. female who presents to the clinic today for:   HPI    Retina Follow Up    Patient presents with  Wet AMD.  In left eye.  Severity is moderate.  Duration of 5 weeks.  Since onset it is stable.  I, the attending physician,  performed the HPI with the patient and updated documentation appropriately.          Comments    Patient states vision the same OU.        Last edited by Rennis ChrisZamora, Torrey Ballinas, MD on 03/03/2019 10:11 AM. (History)       Referring physician: Terressa KoyanagiKim, [name], DO [address] [place],  [place] [id number]  HISTORICAL INFORMATION:   Selected notes from the MEDICAL RECORD NUMBER Referred by Dr. SwazilandJordan DeMarco for concern of exu ARMD LEE: 11.26.19 (J. 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 (Ophthalmic Drugs)   No current facility-administered medications for this visit.  (Ophthalmic Drugs)   Current Outpatient Medications (Other)  Medication Sig  . aspirin EC 81 MG tablet Take 81 mg by mouth daily.  . Biotin w/ Vitamins C & E (HAIR/SKIN/NAILS PO) Take 1 tablet by mouth daily.  . isosorbide mononitrate (IMDUR) 60 MG 24 hr tablet TAKE 1 TABLET ONCE DAILY.  . Multiple Vitamins-Calcium (ONE-A-DAY WOMENS PO) Take 1 tablet by mouth daily.   . Multiple Vitamins-Minerals (PRESERVISION AREDS 2 PO) Take 1 capsule by mouth 2 (two) times daily.  . nitroGLYCERIN (NITROSTAT) 0.4 MG SL tablet Place 1 tablet (0.4 mg total) under the tongue every 5 (five) minutes as needed for chest pain.  Marland Kitchen. PRESCRIPTION MEDICATION Avastin eye injections given by Dr Vanessa BarbaraZamora due to macular degeneration  . rosuvastatin (CRESTOR) 10 MG tablet TAKE (1/2) TABLET  DAILY.  . SYNTHROID 50 MCG tablet TAKE 1 TABLET EACH DAY.  . hydrALAZINE (APRESOLINE) 25 MG tablet Take 25 mg by mouth as needed.    No current facility-administered medications for this visit.  (Other)      REVIEW OF SYSTEMS: ROS    Positive for: Gastrointestinal, Genitourinary, Musculoskeletal, Cardiovascular, Eyes   Negative for: Constitutional, Neurological, Skin, HENT, Endocrine, Respiratory, Psychiatric, Allergic/Imm, Heme/Lymph   Last edited by Annalee GentaBarber, Daryl D on 03/03/2019  9:22 AM. (History)       ALLERGIES Allergies  Allergen Reactions  . Acyclovir And Related Other (See Comments)    unknown  . Pravastatin Sodium Other (See Comments)    cystitis  . Zocor [Simvastatin] Other (See Comments)    cystitis    PAST MEDICAL HISTORY Past Medical History:  Diagnosis Date  . Arthritis    DDD, scoliosis, sees Dr. Lovell SheehanJenkins for this, uses norco very rarely for pain  . Bradycardia 11/11/2017  . CAD (coronary artery disease)    LAD stenting of a 90% lesion 2012  . Cystocele   . Elevated cholesterol   . GERD (gastroesophageal reflux disease)    dx in work up 2016 for atypical CP at OSH   pt. denies  . Heart murmur   . Hypertensive retinopathy    OU  . Hypothyroidism   . Interstitial cystitis    sees Dr. Annabell HowellsWrenn  .  Macular degeneration    OU  . NSTEMI (non-ST elevated myocardial infarction) ([place])   . Osteoporosis   . Rectocele   . 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 Scoliosis   . Thyroid disease    Hypothyroid  . Urinary incontinence    USI  . Uterine prolapse    Past Surgical History:  Procedure Laterality Date  . BLADDER SUSPENSION  2011  . CATARACT EXTRACTION Bilateral 2015   Dr. Herbert Deaner  . CORONARY ANGIOPLASTY WITH STENT PLACEMENT  04/21/2010   LAD 80%, RCA 30%, nl EF, s/p DES LAD  . EYE SURGERY    . LEFT HEART CATH AND CORONARY ANGIOGRAPHY N/A 06/14/2017   Procedure: LEFT HEART CATH AND CORONARY ANGIOGRAPHY;  Surgeon: Lorretta Harp, MD;  Location:  [facility];  Service: Cardiovascular;  Laterality: N/A;  . OOPHORECTOMY  2011   BSO  . TOTAL HIP ARTHROPLASTY Right 06/10/2017   Procedure: RIGHT TOTAL HIP ARTHROPLASTY ANTERIOR APPROACH;  Surgeon: Rod Can, MD;  Location: [facility];  Service: Orthopedics;  Laterality: Right;  Needs RNFA  . VAGINAL HYSTERECTOMY  2011   LAVH BSO; benign    FAMILY HISTORY Family History  Problem Relation Age of Onset  . Hypertension Mother   . Heart disease Mother   . Heart disease Father   . Heart disease Sister   . Colon cancer Paternal Aunt 6  . Breast cancer Paternal Aunt        Age 92's  . Diabetes Sister   . Endometrial cancer Sister   . Breast cancer Cousin        Maternal 1st cousins-Age 15's  . Leukemia Paternal Aunt     SOCIAL HISTORY Social History   Tobacco Use  . Smoking status: Never Smoker  . Smokeless tobacco: Never Used  Substance Use Topics  . Alcohol use: No    Comment: Rare  . Drug use: No         OPHTHALMIC EXAM:  Base Eye Exam    Visual Acuity (Snellen - Linear)      Right Left   Dist [place] 20/30 20/40 -1   Dist ph [place] 20/25 -1 20/25 -2       Tonometry (Tonopen, 9:31 AM)      Right Left   Pressure 15 18       Pupils      Dark Light Shape React APD   Right 4 3 Round Brisk None   Left 4 3 Round Brisk None       Visual Fields (Counting fingers)      Left Right    Full Full       Extraocular Movement      Right Left    Full, Ortho Full, Ortho       Neuro/Psych    Oriented x3: Yes   Mood/Affect: Normal       Dilation    Both eyes: 1.0% Mydriacyl, 2.5% Phenylephrine @ 9:31 AM        Slit Lamp and Fundus Exam    Slit Lamp Exam      Right Left   Lids/Lashes mild Telangiectasia, marginal leasion nasal UL Telangiectasia, Meibomian gland dysfunction   Conjunctiva/Sclera White and quiet White and quiet   Cornea Arcus, 1 Punctate epithelial erosions 1-2+ Punctate epithelial erosions, Arcus   Anterior Chamber Deep and clear Deep and  clear   Iris Round and dilated Round and well dilated   Lens PC IOL in good position PC  IOL in excellent position   Vitreous Vitreous syneresis, Posterior vitreous detachment Vitreous syneresis, Posterior vitreous detachment, vitreous condensations       Fundus Exam      Right Left   Disc Pink and Sharp, mild PPP, peripapillary heme at 1200--persistent Pink and Sharp, mild temporal PPA   C/D Ratio 0.4 0.3   Macula Blunted foveal reflex, Drusen, RPE mottling and clumping, early Atrophy, +PEDs,  No heme or edema Blunted foveal reflex, +drusen, pigment clumping, no heme, stable resolution of central edema   Vessels Vascular attenuation Vascular attenuation   Periphery Attached, scattered reticular degeneration   Attached, scattered reticular degeneration          IMAGING AND PROCEDURES  Imaging and Procedures for @TODAY @  OCT, Retina - OU - Both Eyes       Right Eye Quality was good. Central Foveal Thickness: 279. Progression has been stable. Findings include abnormal foveal contour, retinal drusen , pigment epithelial detachment, outer retinal atrophy, intraretinal hyper-reflective material, no IRF, no SRF (Stable PED).   Left Eye Quality was good. Central Foveal Thickness: 224. Progression has been stable. Findings include retinal drusen , outer retinal atrophy, pigment epithelial detachment, intraretinal hyper-reflective material, normal foveal contour, no SRF, no IRF (Stable resolution of bullous PED and IRF/SRF -- now just low lying PED with overlying ORA).   Notes *Images captured and stored on drive  Diagnosis / Impression:  OD: nonexudative ARMD OU -- no change from prior OS: exu-ARMD -- stable resolution of bullous PED and IRF/SRF -- now just low lying PED with overlying ORA   Clinical management:  See below  Abbreviations: NFP - Normal foveal profile. CME - cystoid macular edema. PED - pigment epithelial detachment. IRF - intraretinal fluid. SRF - subretinal fluid. EZ  - ellipsoid zone. ERM - epiretinal membrane. ORA - outer retinal atrophy. ORT - outer retinal tubulation. SRHM - subretinal hyper-reflective material        Intravitreal Injection, Pharmacologic Agent - OS - Left Eye       Time Out 03/03/2019. 10:57 AM. Confirmed correct patient, procedure, site, and patient consented.   Anesthesia Topical anesthesia was used. Anesthetic medications included Lidocaine 2%, Proparacaine 0.5%.   Procedure Preparation included 5% betadine to ocular surface, eyelid speculum. A 30 gauge needle was used.   Injection:  1.25 mg Bevacizumab (AVASTIN) SOLN   NDC: 16109-604-54, Lot: [id number] , Expiration date: 05/04/2019   Route: Intravitreal, Site: Left Eye, Waste: 0 mL  Post-op Post injection exam found visual acuity of at least counting fingers. The patient tolerated the procedure well. There were no complications. The patient received written and verbal post procedure care education. Post injection medications were not given.                 ASSESSMENT/PLAN:    ICD-10-CM   1. Exudative age-related macular degeneration of left eye with active choroidal neovascularization (HCC)  H35.3221 Intravitreal Injection, Pharmacologic Agent - OS - Left Eye    Bevacizumab (AVASTIN) SOLN 1.25 mg  2. Retinal edema  H35.81 OCT, Retina - OU - Both Eyes  3. Intermediate stage nonexudative age-related macular degeneration of right eye  H35.3112   4. Essential hypertension  I10   5. Hypertensive retinopathy of both eyes  H35.033   6. Pseudophakia of both eyes  Z96.1   7. Intermediate stage nonexudative age-related macular degeneration of both eyes  H35.3132     1. Exudative age-related macular degeneration, left eye.    - OCT 4.30.2020  showed interval conversion of OD from nonexudative ARMD to exu-ARMD with large dome-shaped PED with overlying IRF/SRF--stable  - s/p IVA OS #1 (04.30.20), #2 (05.28.20), #3 (06.26.20)  - today, stable resolution of PED w/  overlying IRF/SRF -- back to baseline low-lying PED   - benefits investigation for Eylea initiated 4.30.2020 -- approved as of 5.28.2020  - FA 5.28.2020 -- no active CNV OS, just staining  - discussed findings and excellent response to IVA  - recommend IVA OS #4 today (07.31.20) w/ extension to 6 wks  - RBA of procedure discussed, questions answered  - informed consent obtained and signed  - see procedure note  - f/u in 6 wks -- DFE/OCT/treat and extend  3. Age related macular degeneration, non-exudative, OD  - pt initially presented on 11.27.19 due to alert from [facility] home monitoring system for OD, but was unable to stay for fluorescein angiogram at that time  - [facility] prescribed by Dr. Elmer PickerHecker  - no heme or obvious CNVM on exam  - here today for triggered [facility] alert OD  - OCT shows PEDs with tr cystic changes OD -- stable from prior  - BCVA stable at 20/25 OD  - repeat FA (5.28.2020) shows staining / window defect corresponding temporal RPE changes OU -- no active CNV OU  - The incidence, anatomy, and pathology of dry AMD, risk of progression, and the AREDS and AREDS 2 study including smoking risks discussed with patient.  - no acute intervention indicated at this time  - Recommend amsler grid monitoring  4,5. Hypertensive retinopathy OU  - discussed importance of tight BP control  - monitor  6. Pseudophakia OU  - s/p CE/IOL OU  - beautiful surgeries, doing well  - monitor   Ophthalmic Meds Ordered this visit:  Meds ordered this encounter  Medications  . Bevacizumab (AVASTIN) SOLN 1.25 mg       Return in about 6 weeks (around 04/14/2019) for DFE, OCT.  There are no Patient Instructions on file for this visit.   Explained the diagnoses, plan, and follow up with the patient and they expressed understanding.  Patient expressed understanding of the importance of proper follow up care.   This document serves as a record of services personally performed by Karie ChimeraBrian G.  Faye Sanfilippo, MD, PhD. It was created on their behalf by Laurian BrimAmanda Brown, OA, an ophthalmic assistant. The creation of this record is the provider's dictation and/or activities during the visit.    Electronically signed by: Laurian BrimAmanda Brown, OA  07.30.2020 5:07 PM     Karie ChimeraBrian G. Shantae Vantol, M.D., Ph.D. Diseases & Surgery of the Retina and Vitreous [facility]  I have reviewed the above documentation for accuracy and completeness, and I agree with the above. Karie ChimeraBrian G. Pakou Rainbow, M.D., Ph.D. 03/06/19 5:07 PM   Abbreviations: M myopia (nearsighted); A astigmatism; H hyperopia (farsighted); P presbyopia; Mrx spectacle prescription;  CTL contact lenses; OD right eye; OS left eye; OU both eyes  XT exotropia; ET esotropia; PEK punctate epithelial keratitis; PEE punctate epithelial erosions; DES dry eye syndrome; MGD meibomian gland dysfunction; ATs artificial tears; PFAT's preservative free artificial tears; NSC nuclear sclerotic cataract; PSC posterior subcapsular cataract; ERM epi-retinal membrane; PVD posterior vitreous detachment; RD retinal detachment; DM diabetes mellitus; DR diabetic retinopathy; NPDR non-proliferative diabetic retinopathy; PDR proliferative diabetic retinopathy; CSME clinically significant macular edema; DME diabetic macular edema; dbh dot blot hemorrhages; CWS cotton wool spot; POAG primary open angle glaucoma; C/D cup-to-disc ratio; HVF humphrey visual  field; GVF goldmann visual field; OCT optical coherence tomography; IOP intraocular pressure; BRVO Branch retinal vein occlusion; CRVO central retinal vein occlusion; CRAO central retinal artery occlusion; BRAO branch retinal artery occlusion; RT retinal tear; SB scleral buckle; PPV pars plana vitrectomy; VH Vitreous hemorrhage; PRP panretinal laser photocoagulation; IVK intravitreal kenalog; VMT vitreomacular traction; MH Macular hole;  NVD neovascularization of the disc; NVE neovascularization elsewhere; AREDS age related eye  disease study; ARMD age related macular degeneration; POAG primary open angle glaucoma; EBMD epithelial/anterior basement membrane dystrophy; ACIOL anterior chamber intraocular lens; IOL intraocular lens; PCIOL posterior chamber intraocular lens; Phaco/IOL phacoemulsification with intraocular lens placement; PRK photorefractive keratectomy; LASIK laser assisted in situ keratomileusis; HTN hypertension; DM diabetes mellitus; COPD chronic obstructive pulmonary disease

## 2019-03-03 ENCOUNTER — Other Ambulatory Visit: Payer: Self-pay

## 2019-03-03 ENCOUNTER — Ambulatory Visit (INDEPENDENT_AMBULATORY_CARE_PROVIDER_SITE_OTHER): Payer: Medicare Other | Admitting: Ophthalmology

## 2019-03-03 ENCOUNTER — Encounter (INDEPENDENT_AMBULATORY_CARE_PROVIDER_SITE_OTHER): Payer: Self-pay | Admitting: Ophthalmology

## 2019-03-03 DIAGNOSIS — H353221 Exudative age-related macular degeneration, left eye, with active choroidal neovascularization: Secondary | ICD-10-CM

## 2019-03-03 DIAGNOSIS — H353112 Nonexudative age-related macular degeneration, right eye, intermediate dry stage: Secondary | ICD-10-CM | POA: Diagnosis not present

## 2019-03-03 DIAGNOSIS — I10 Essential (primary) hypertension: Secondary | ICD-10-CM | POA: Diagnosis not present

## 2019-03-03 DIAGNOSIS — H3581 Retinal edema: Secondary | ICD-10-CM

## 2019-03-03 DIAGNOSIS — H35033 Hypertensive retinopathy, bilateral: Secondary | ICD-10-CM

## 2019-03-03 DIAGNOSIS — H353132 Nonexudative age-related macular degeneration, bilateral, intermediate dry stage: Secondary | ICD-10-CM

## 2019-03-03 DIAGNOSIS — Z961 Presence of intraocular lens: Secondary | ICD-10-CM

## 2019-03-06 MED ORDER — BEVACIZUMAB CHEMO INJECTION 1.25MG/0.05ML SYRINGE FOR KALEIDOSCOPE
1.2500 mg | INTRAVITREAL | Status: AC | PRN
  Administered 2019-03-06: 1.25 mg via INTRAVITREAL

## 2019-03-27 NOTE — Progress Notes (Signed)
HPI The patient presents for followup up of CAD.  She had a PCI of LAD in 2012. She recently underwent total right hip arthroplasty. She presented to the hospital on 06/12/2017 with sudden onset of right-sided neck pain with radiation to the right jaw. CTA of the chest was negative for PE however troponin was elevated at 0.42. Echocardiogram was obtained on 06/13/2017 showed EF 60-65%, mild LVH, grade 1 DD, mild MR, mild TR, peak PA pressure 30 mmHg. Cardiac catheterization performed on 06/14/2017 showed 95% ostial D1 lesion, widely patent ostial LAD stent, 30% proximal LAD stenosis. Medical therapy was recommended. Imdur 60 mg daily was added to her medical regimen. Beta blocker was initially increased and later reduced due to symptomatic bradycardia. At a previous visit I stopped the beta blocker.  She is done well since I last saw her.  She is not been checking her blood pressure so she has not used any hydralazine which she was using as needed previously.  She is not having any of the dizziness she was having.  She is not having any palpitations, presyncope or syncope.  She said no weight gain or edema.  Of note she is doing some water aerobics.  She is been a little bit limited because of knee pain.   Allergies  Allergen Reactions  . Acyclovir And Related Other (See Comments)    unknown  . Pravastatin Sodium Other (See Comments)    cystitis  . Zocor [Simvastatin] Other (See Comments)    cystitis    Current Outpatient Medications  Medication Sig Dispense Refill  . aspirin EC 81 MG tablet Take 81 mg by mouth daily.    . Biotin w/ Vitamins C & E (HAIR/SKIN/NAILS PO) Take 1 tablet by mouth daily.    . isosorbide mononitrate (IMDUR) 60 MG 24 hr tablet TAKE 1 TABLET ONCE DAILY. 30 tablet 3  . Multiple Vitamins-Calcium (ONE-A-DAY WOMENS PO) Take 1 tablet by mouth daily.     . Multiple Vitamins-Minerals (PRESERVISION AREDS 2 PO) Take 1 capsule by mouth 2 (two) times daily.    .  nitroGLYCERIN (NITROSTAT) 0.4 MG SL tablet Place 1 tablet (0.4 mg total) under the tongue every 5 (five) minutes as needed for chest pain. 25 tablet 8  . PRESCRIPTION MEDICATION Avastin eye injections given by Dr Vanessa BarbaraZamora due to macular degeneration    . rosuvastatin (CRESTOR) 10 MG tablet TAKE (1/2) TABLET DAILY. 60 tablet 0  . SYNTHROID 50 MCG tablet TAKE 1 TABLET EACH DAY. 90 tablet 0  . hydrALAZINE (APRESOLINE) 25 MG tablet Take 25 mg by mouth as needed.      No current facility-administered medications for this visit.     Past Medical History:  Diagnosis Date  . Arthritis    DDD, scoliosis, sees Dr. Lovell SheehanJenkins for this, uses norco very rarely for pain  . Bradycardia 11/11/2017  . CAD (coronary artery disease)    LAD stenting of a 90% lesion 2012  . Cystocele   . Elevated cholesterol   . GERD (gastroesophageal reflux disease)    dx in work up 2016 for atypical CP at OSH   pt. denies  . Heart murmur   . Hypertensive retinopathy    OU  . Hypothyroidism   . Interstitial cystitis    sees Dr. Annabell HowellsWrenn  . Macular degeneration    OU  . NSTEMI (non-ST elevated myocardial infarction) (HCC)   . Osteoporosis   . Rectocele   . S/P hip replacement, right  06/12/2017  . Scoliosis   . Thyroid disease    Hypothyroid  . Urinary incontinence    USI  . Uterine prolapse     Past Surgical History:  Procedure Laterality Date  . BLADDER SUSPENSION  2011  . CATARACT EXTRACTION Bilateral 2015   Dr. Herbert Deaner  . CORONARY ANGIOPLASTY WITH STENT PLACEMENT  04/21/2010   LAD 80%, RCA 30%, nl EF, s/p DES LAD  . EYE SURGERY    . LEFT HEART CATH AND CORONARY ANGIOGRAPHY N/A 06/14/2017   Procedure: LEFT HEART CATH AND CORONARY ANGIOGRAPHY;  Surgeon: Lorretta Harp, MD;  Location: [facility];  Service: Cardiovascular;  Laterality: N/A;  . OOPHORECTOMY  2011   BSO  . TOTAL HIP ARTHROPLASTY Right 06/10/2017   Procedure: RIGHT TOTAL HIP ARTHROPLASTY ANTERIOR APPROACH;  Surgeon: Rod Can, MD;   Location: [facility];  Service: Orthopedics;  Laterality: Right;  Needs RNFA  . VAGINAL HYSTERECTOMY  2011   LAVH BSO; benign    ROS:   As stated in the HPI and negative for all other systems.   PHYSICAL EXAM BP (!) 170/60   Pulse (!) 55   Ht 5\' 3"  (1.6 m)   Wt 128 lb 6.4 oz (58.2 kg)   SpO2 96%   BMI 22.75 kg/m   GENERAL:  Well appearing NECK:  No jugular venous distention, waveform within normal limits, carotid upstroke brisk and symmetric, no bruits, no thyromegaly LUNGS:  Clear to auscultation bilaterally CHEST:  Unremarkable HEART:  PMI not displaced or sustained,S1 and S2 within normal limits, no S3, no S4, no clicks, no rubs, very soft apical nonradiating systolic murmur, no diastolic murmurs ABD:  Flat, positive bowel sounds normal in frequency in pitch, no bruits, no rebound, no guarding, no midline pulsatile mass, no hepatomegaly, no splenomegaly EXT:  2 plus pulses throughout, no edema, no cyanosis no clubbing   EKG:  NA  Lab Results  Component Value Date   CHOL 195 08/17/2018   TRIG 141.0 08/17/2018   HDL 54.00 08/17/2018   LDLCALC 112 (H) 08/17/2018   LDLDIRECT 140.6 05/25/2012     ASSESSMENT AND PLAN   CAD, NATIVE VESSEL -  The patient has no new sypmtoms.  No further cardiovascular testing is indicated.  We will continue with aggressive risk reduction and meds as listed.  HYPERTENSION -  Her blood pressure is elevated but this is a little bit unusual.  She agrees to do a blood pressure diary checking it twice a day.  I will make changes as suggested by these results.   Hyperlipidemia - Her LDL is been slightly above target.  She can get this checked again fasting.  She says she would agree to take a slightly higher dose of statin if she is not at target.

## 2019-03-28 ENCOUNTER — Ambulatory Visit (INDEPENDENT_AMBULATORY_CARE_PROVIDER_SITE_OTHER): Payer: Medicare Other | Admitting: Cardiology

## 2019-03-28 ENCOUNTER — Encounter: Payer: Self-pay | Admitting: Cardiology

## 2019-03-28 ENCOUNTER — Other Ambulatory Visit: Payer: Self-pay

## 2019-03-28 VITALS — BP 170/60 | HR 55 | Ht 63.0 in | Wt 128.4 lb

## 2019-03-28 DIAGNOSIS — I2510 Atherosclerotic heart disease of native coronary artery without angina pectoris: Secondary | ICD-10-CM | POA: Diagnosis not present

## 2019-03-28 DIAGNOSIS — I10 Essential (primary) hypertension: Secondary | ICD-10-CM | POA: Diagnosis not present

## 2019-03-28 DIAGNOSIS — E785 Hyperlipidemia, unspecified: Secondary | ICD-10-CM

## 2019-03-28 NOTE — Patient Instructions (Addendum)
Medication Instructions:  Your physician recommends that you continue on your current medications as directed. Please refer to the Current Medication list given to you today.  If you need a refill on your cardiac medications before your next appointment, please call your pharmacy.   Lab work: HAVE YOUR FASTING LABS SOON AS YOUR PRIMARY DISCUSSED   Testing/Procedures: NONE  Follow-Up: At [facility], you and your health needs are our priority.  As part of our continuing mission to provide you with exceptional heart care, we have created designated Provider Care Teams.  These Care Teams include your primary Cardiologist (physician) and Advanced Practice Providers (APPs -  Physician Assistants and Nurse Practitioners) who all work together to provide you with the care you need, when you need it. You will need a follow up appointment in 12 months.  Please call our office 2 months in advance to schedule this appointment.  You may see Minus Breeding, MD or one of the following Advanced Practice Providers on your designated Care Team:   Rosaria Ferries, PA-C . Jory Sims, DNP, ANP   LOG YOUR BLOOD PRESSURE, IF NO IMPROVEMENT CALL THE OFFICE

## 2019-03-29 ENCOUNTER — Telehealth: Payer: Self-pay | Admitting: *Deleted

## 2019-03-29 NOTE — Telephone Encounter (Signed)
There is already an order in system for future labs from me that include this. Please help her schedule (looks like due in sept)

## 2019-03-29 NOTE — Telephone Encounter (Signed)
Copied from [place] [id number]. Topic: General - Other >> Mar 29, 2019  1:08 PM Leward Quan A wrote: Reason for CRM: Patient called to say that her cardiologist want her to have fasting labs which should include cholesterol check. Can be reached at Ph#  [phone]

## 2019-03-30 NOTE — Telephone Encounter (Signed)
Left a detailed message at the pts home number to schedule a lab appt as below.

## 2019-03-31 ENCOUNTER — Other Ambulatory Visit (INDEPENDENT_AMBULATORY_CARE_PROVIDER_SITE_OTHER): Payer: Medicare Other

## 2019-03-31 ENCOUNTER — Other Ambulatory Visit: Payer: Self-pay

## 2019-03-31 DIAGNOSIS — E039 Hypothyroidism, unspecified: Secondary | ICD-10-CM | POA: Diagnosis not present

## 2019-03-31 DIAGNOSIS — E785 Hyperlipidemia, unspecified: Secondary | ICD-10-CM

## 2019-03-31 DIAGNOSIS — I10 Essential (primary) hypertension: Secondary | ICD-10-CM

## 2019-03-31 LAB — COMPREHENSIVE METABOLIC PANEL
ALT: 11 U/L (ref 0–35)
AST: 15 U/L (ref 0–37)
Albumin: 4 g/dL (ref 3.5–5.2)
Alkaline Phosphatase: 75 U/L (ref 39–117)
BUN: 21 mg/dL (ref 6–23)
CO2: 31 mEq/L (ref 19–32)
Calcium: 9.2 mg/dL (ref 8.4–10.5)
Chloride: 103 mEq/L (ref 96–112)
Creatinine, Ser: 0.79 mg/dL (ref 0.40–1.20)
GFR: 69.67 mL/min (ref >60.00)
Glucose, Bld: 83 mg/dL (ref 70–99)
Potassium: 3.8 mEq/L (ref 3.5–5.1)
Sodium: 142 mEq/L (ref 135–145)
Total Bilirubin: 0.5 mg/dL (ref 0.2–1.2)
Total Protein: 6.4 g/dL (ref 6.0–8.3)

## 2019-03-31 LAB — CBC WITH DIFFERENTIAL/PLATELET
Basophils Absolute: 0 10*3/uL (ref 0.0–0.1)
Basophils Relative: 0.5 % (ref 0.0–3.0)
Eosinophils Absolute: 0.2 10*3/uL (ref 0.0–0.7)
Eosinophils Relative: 3.2 % (ref 0.0–5.0)
HCT: 40.9 % (ref 36.0–46.0)
Hemoglobin: 13.8 g/dL (ref 12.0–15.0)
Lymphocytes Relative: 15.1 % (ref 12.0–46.0)
Lymphs Abs: 0.9 10*3/uL (ref 0.7–4.0)
MCHC: 33.7 g/dL (ref 30.0–36.0)
MCV: 91.9 fl (ref 78.0–100.0)
Monocytes Absolute: 0.5 10*3/uL (ref 0.1–1.0)
Monocytes Relative: 8.6 % (ref 3.0–12.0)
Neutro Abs: 4.5 10*3/uL (ref 1.4–7.7)
Neutrophils Relative %: 72.6 % (ref 43.0–77.0)
Platelets: 248 10*3/uL (ref 150.0–400.0)
RBC: 4.45 Mil/uL (ref 3.87–5.11)
RDW: 13.4 % (ref 11.5–15.5)
WBC: 6.2 10*3/uL (ref 4.0–10.5)

## 2019-03-31 LAB — TSH: TSH: 2.38 u[IU]/mL (ref 0.35–4.50)

## 2019-03-31 LAB — LIPID PANEL
Cholesterol: 154 mg/dL (ref 0–200)
HDL: 57.5 mg/dL (ref >39.00)
LDL Cholesterol: 79 mg/dL (ref 0–99)
NonHDL: 96.51
Total CHOL/HDL Ratio: 3
Triglycerides: 89 mg/dL (ref 0.0–149.0)
VLDL: 17.8 mg/dL (ref 0.0–40.0)

## 2019-04-12 NOTE — Progress Notes (Signed)
[facility] - Clinic Note  04/14/2019     CHIEF COMPLAINT Patient presents for Retina Follow Up   HISTORY OF PRESENT ILLNESS: Kaitlyn Parks is a 84 y.o. female who presents to the clinic today for:   HPI    Retina Follow Up    Patient presents with  Wet AMD.  In left eye.  This started 6 weeks ago.  Severity is moderate.  I, the attending physician,  performed the HPI with the patient and updated documentation appropriately.          Comments    Patient here for 6 weeks retina follow up for exu ARMD OS. Patient states vision seems to have slipped a little yesterday. Today seems better. No eye pain. Just dry eyes.        Last edited by Rennis Chris, MD on 04/14/2019  3:14 PM. (History)    pt states she feels like she cannot see quite as well out of her left eye since yesterday, she states she is still using her Foresee at home on her right eye, she states it triggered once since her last visit, but she thought she could wait until her appt here, so she did not see anyone   Referring physician: Demarco, [place], OD [address] [place],  [place] [address]  HISTORICAL INFORMATION:   Selected notes from the MEDICAL RECORD NUMBER Referred by Dr. [place] DeMarco for concern of exu ARMD LEE: 11.26.19 (J. 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 (Ophthalmic Drugs)   No current facility-administered medications for this visit.  (Ophthalmic Drugs)   Current Outpatient Medications (Other)  Medication Sig  . aspirin EC 81 MG tablet Take 81 mg by mouth daily.  . Biotin w/ Vitamins C & E (HAIR/SKIN/NAILS PO) Take 1 tablet by mouth daily.  . hydrALAZINE (APRESOLINE) 25 MG tablet Take 25 mg by mouth as needed.   . isosorbide mononitrate (IMDUR) 60 MG 24 hr tablet TAKE 1 TABLET ONCE DAILY.  .  Multiple Vitamins-Calcium (ONE-A-DAY WOMENS PO) Take 1 tablet by mouth daily.   . Multiple Vitamins-Minerals (PRESERVISION AREDS 2 PO) Take 1 capsule by mouth 2 (two) times daily.  . nitroGLYCERIN (NITROSTAT) 0.4 MG SL tablet Place 1 tablet (0.4 mg total) under the tongue every 5 (five) minutes as needed for chest pain.  Marland Kitchen PRESCRIPTION MEDICATION Avastin eye injections given by Dr Vanessa Barbara due to macular degeneration  . rosuvastatin (CRESTOR) 10 MG tablet TAKE (1/2) TABLET DAILY.  . SYNTHROID 50 MCG tablet TAKE 1 TABLET EACH DAY.   No current facility-administered medications for this visit.  (Other)      REVIEW OF SYSTEMS: ROS    Positive for: Gastrointestinal, Genitourinary, Musculoskeletal, Cardiovascular, Eyes   Negative for: Constitutional, Neurological, Skin, HENT, Endocrine, Respiratory, Psychiatric, Allergic/Imm, Heme/Lymph   Last edited by Laddie Aquas, COA on 04/14/2019  9:29 AM. (History)       ALLERGIES Allergies  Allergen Reactions  . Acyclovir And Related Other (See Comments)    unknown  . Pravastatin Sodium Other (See Comments)    cystitis  . Zocor [Simvastatin] Other (See Comments)    cystitis    PAST MEDICAL HISTORY Past Medical History:  Diagnosis Date  . Arthritis    DDD, scoliosis, sees Dr. Lovell Sheehan for this, uses norco very rarely for pain  . Bradycardia 11/11/2017  .  CAD (coronary artery disease)    LAD stenting of a 90% lesion 2012  . Cystocele   . Elevated cholesterol   . GERD (gastroesophageal reflux disease)    dx in work up 2016 for atypical CP at OSH   pt. denies  . Heart murmur   . Hypertensive retinopathy    OU  . Hypothyroidism   . Interstitial cystitis    sees Dr. [name]  . Macular degeneration    OU  . NSTEMI (non-ST elevated myocardial infarction) (HCC)   . Osteoporosis   . Rectocele   . 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 Scoliosis   . Thyroid disease    Hypothyroid  . Urinary incontinence    USI  . Uterine prolapse     Past Surgical History:  Procedure Laterality Date  . BLADDER SUSPENSION  2011  . CATARACT EXTRACTION Bilateral 2015   Dr. Elmer Picker  . CORONARY ANGIOPLASTY WITH STENT PLACEMENT  04/21/2010   LAD 80%, RCA 30%, nl EF, s/p DES LAD  . EYE SURGERY    . LEFT HEART CATH AND CORONARY ANGIOGRAPHY N/A 06/14/2017   Procedure: LEFT HEART CATH AND CORONARY ANGIOGRAPHY;  Surgeon: Runell Gess, MD;  Location: [facility];  Service: Cardiovascular;  Laterality: N/A;  . OOPHORECTOMY  2011   BSO  . TOTAL HIP ARTHROPLASTY Right 06/10/2017   Procedure: RIGHT TOTAL HIP ARTHROPLASTY ANTERIOR APPROACH;  Surgeon: Samson Frederic, MD;  Location: [facility];  Service: Orthopedics;  Laterality: Right;  Needs RNFA  . VAGINAL HYSTERECTOMY  2011   LAVH BSO; benign    FAMILY HISTORY Family History  Problem Relation Age of Onset  . Hypertension Mother   . Heart disease Mother   . Heart disease Father   . Heart disease Sister   . Colon cancer Paternal Aunt 70  . Breast cancer Paternal Aunt        Age 38's  . Diabetes Sister   . Endometrial cancer Sister   . Breast cancer Cousin        Maternal 1st cousins-Age 30's  . Leukemia Paternal Aunt     SOCIAL HISTORY Social History   Tobacco Use  . Smoking status: Never Smoker  . Smokeless tobacco: Never Used  Substance Use Topics  . Alcohol use: No    Comment: Rare  . Drug use: No         OPHTHALMIC EXAM:  Base Eye Exam    Visual Acuity (Snellen - Linear)      Right Left   Dist [place] 20/40 -2 20/20 -2   Dist ph [place] 20/30 -2        Tonometry (Tonopen, 9:26 AM)      Right Left   Pressure 13 10       Pupils      Dark Light Shape React APD   Right 4 3 Round Brisk None   Left 4 3 Round Brisk None       Visual Fields (Counting fingers)      Left Right    Full Full       Extraocular Movement      Right Left    Full, Ortho Full, Ortho       Neuro/Psych    Oriented x3: Yes   Mood/Affect: Normal       Dilation    Both eyes:  1.0% Mydriacyl, 2.5% Phenylephrine @ 9:26 AM        Slit Lamp and Fundus Exam  Slit Lamp Exam      Right Left   Lids/Lashes mild Telangiectasia, marginal leasion nasal UL Telangiectasia, Meibomian gland dysfunction   Conjunctiva/Sclera White and quiet White and quiet   Cornea Arcus, 1 Punctate epithelial erosions 1-2+ Punctate epithelial erosions, Arcus   Anterior Chamber Deep and clear Deep and clear   Iris Round and dilated Round and well dilated   Lens PC IOL in good position PC IOL in excellent position   Vitreous Vitreous syneresis, Posterior vitreous detachment Vitreous syneresis, Posterior vitreous detachment, vitreous condensations       Fundus Exam      Right Left   Disc Pink and Sharp, mild PPP, peripapillary heme at 1200--persistent Pink and Sharp, mild temporal PPA   C/D Ratio 0.4 0.3   Macula Blunted foveal reflex, Drusen, RPE mottling and clumping, early Atrophy, +PEDs,  Interval development of central cyst / exudative disease, No heme Blunted foveal reflex, +drusen, pigment clumping, no heme, stable resolution of central edema   Vessels Vascular attenuation Vascular attenuation   Periphery Attached, scattered reticular degeneration   Attached, scattered reticular degeneration          IMAGING AND PROCEDURES  Imaging and Procedures for @TODAY @  OCT, Retina - OU - Both Eyes       Right Eye Quality was good. Central Foveal Thickness: 330. Progression has worsened. Findings include abnormal foveal contour, retinal drusen , pigment epithelial detachment, outer retinal atrophy, intraretinal hyper-reflective material, no SRF, intraretinal fluid (Interval development of IRF; Stable PED).   Left Eye Quality was good. Central Foveal Thickness: 229. Progression has been stable. Findings include retinal drusen , outer retinal atrophy, pigment epithelial detachment, intraretinal hyper-reflective material, normal foveal contour, no SRF, no IRF (Stable resolution of bullous  PED and IRF/SRF -- now just low lying PED with overlying ORA).   Notes *Images captured and stored on drive  Diagnosis / Impression:  OD: interval conversion of nonexudative ARMD to exudative ARMD -- Interval development of IRF; Stable PED OS: exu-ARMD -- stable resolution of bullous PED and IRF/SRF -- now just low lying PED with overlying ORA   Clinical management:  See below  Abbreviations: NFP - Normal foveal profile. CME - cystoid macular edema. PED - pigment epithelial detachment. IRF - intraretinal fluid. SRF - subretinal fluid. EZ - ellipsoid zone. ERM - epiretinal membrane. ORA - outer retinal atrophy. ORT - outer retinal tubulation. SRHM - subretinal hyper-reflective material        Intravitreal Injection, Pharmacologic Agent - OS - Left Eye       Time Out 04/14/2019. 10:48 AM. Confirmed correct patient, procedure, site, and patient consented.   Anesthesia Topical anesthesia was used. Anesthetic medications included Lidocaine 2%, Proparacaine 0.5%.   Procedure Preparation included 5% betadine to ocular surface, eyelid speculum. A 30 gauge needle was used.   Injection:  1.25 mg Bevacizumab (AVASTIN) SOLN   NDC: 62130-865-7850242-060-01, Lot: [id number] , Expiration date: 06/13/2019   Route: Intravitreal, Site: Left Eye, Waste: 0 mL  Post-op Post injection exam found visual acuity of at least counting fingers. The patient tolerated the procedure well. There were no complications. The patient received written and verbal post procedure care education.        Intravitreal Injection, Pharmacologic Agent - OD - Right Eye       Time Out 04/14/2019. 10:47 AM. Confirmed correct patient, procedure, site, and patient consented.   Anesthesia Topical anesthesia was used. Anesthetic medications included Lidocaine 2%, Proparacaine 0.5%.   Procedure Preparation included  5% betadine to ocular surface, eyelid speculum. A 30 gauge needle was used.   Injection:  1.25 mg Bevacizumab  (AVASTIN) SOLN   NDC: 68032-122-48, Lot: [id number] , Expiration date: 07/20/2019   Route: Intravitreal, Site: Right Eye, Waste: 0 mL  Post-op Post injection exam found visual acuity of at least counting fingers. The patient tolerated the procedure well. There were no complications. The patient received written and verbal post procedure care education.                 ASSESSMENT/PLAN:    ICD-10-CM   1. Exudative age-related macular degeneration of left eye with active choroidal neovascularization (HCC)  H35.3221 Intravitreal Injection, Pharmacologic Agent - OS - Left Eye    Bevacizumab (AVASTIN) SOLN 1.25 mg    CANCELED: Intravitreal Injection, Pharmacologic Agent - OS - Left Eye  2. Retinal edema  H35.81 OCT, Retina - OU - Both Eyes  3. Exudative age-related macular degeneration of right eye with active choroidal neovascularization (HCC)  H35.3211 Intravitreal Injection, Pharmacologic Agent - OD - Right Eye    Bevacizumab (AVASTIN) SOLN 1.25 mg  4. Essential hypertension  I10   5. Hypertensive retinopathy of both eyes  H35.033   6. Pseudophakia of both eyes  Z96.1     1. Exudative age-related macular degeneration, left eye.    - OCT 4.30.2020 showed interval conversion of OD from nonexudative ARMD to exu-ARMD with large dome-shaped PED with overlying IRF/SRF--stable  - s/p IVA OS #1 (04.30.20), #2 (05.28.20), #3 (06.26.20), #4 (07.31.20)  - today, stable resolution of PED w/ overlying IRF/SRF -- back to baseline low-lying PED   - benefits investigation for Eylea initiated 4.30.2020 -- approved as of 5.28.2020  - FA 5.28.2020 -- no active CNV OS, just staining  - discussed findings and excellent response to IVA  - recommend IVA OS #5 today (09.11.20) w/ extension to 8 wks  - RBA of procedure discussed, questions answered  - informed consent obtained and signed  - see procedure note  - f/u in 8 wks -- DFE/OCT/treat and extend  3. Age related macular degeneration,  exudative, OD  - interval development of IRF first noted on 09.11.20 -- conversion from nonexudative to exudative ARMD  - pt initially presented on 11.27.19 due to alert from Foresee home monitoring system for OD, but was unable to stay for fluorescein angiogram at that time  - Foresee prescribed by Dr. 12.10.19  - repeat FA (5.28.2020) shows staining / window defect corresponding temporal RPE changes OU -- no active CNV OU  - OCT shows interval development of IRF; stable PEDs  - BCVA decreased to 20/30-2 from 20/25 OD  - recommend IVA OD #1 today, 09.11.20  - pt wishes to proceed  - RBA of procedure discussed, questions answered  - informed consent obtained and signed  - see procedure note  - f/u 4 weeks  4,5. Hypertensive retinopathy OU  - discussed importance of tight BP control  - monitor  6. Pseudophakia OU  - s/p CE/IOL OU  - beautiful surgeries, doing well  - monitor   Ophthalmic Meds Ordered this visit:  Meds ordered this encounter  Medications  . Bevacizumab (AVASTIN) SOLN 1.25 mg  . Bevacizumab (AVASTIN) SOLN 1.25 mg       Return in about 4 weeks (around 05/12/2019) for f/u exu ARMD OD, DFE, OCT.  There are no Patient Instructions on file for this visit.   Explained the diagnoses, plan, and follow up with the patient and they  expressed understanding.  Patient expressed understanding of the importance of proper follow up care.   This document serves as a record of services personally performed by Karie ChimeraBrian G. Akila Batta, MD, PhD. It was created on their behalf by Annalee Gentaaryl Barber, COMT. The creation of this record is the provider's dictation and/or activities during the visit.  Electronically signed by: Annalee Gentaaryl Barber, COMT 04/16/19 2:21 AM   Karie ChimeraBrian G. Kathreen Dileo, M.D., Ph.D. Diseases & Surgery of the Retina and Vitreous [facility]  I have reviewed the above documentation for accuracy and completeness, and I agree with the above. Karie ChimeraBrian G. Delray Reza, M.D.,  Ph.D. 04/16/19 2:22 AM    Abbreviations: M myopia (nearsighted); A astigmatism; H hyperopia (farsighted); P presbyopia; Mrx spectacle prescription;  CTL contact lenses; OD right eye; OS left eye; OU both eyes  XT exotropia; ET esotropia; PEK punctate epithelial keratitis; PEE punctate epithelial erosions; DES dry eye syndrome; MGD meibomian gland dysfunction; ATs artificial tears; PFAT's preservative free artificial tears; NSC nuclear sclerotic cataract; PSC posterior subcapsular cataract; ERM epi-retinal membrane; PVD posterior vitreous detachment; RD retinal detachment; DM diabetes mellitus; DR diabetic retinopathy; NPDR non-proliferative diabetic retinopathy; PDR proliferative diabetic retinopathy; CSME clinically significant macular edema; DME diabetic macular edema; dbh dot blot hemorrhages; CWS cotton wool spot; POAG primary open angle glaucoma; C/D cup-to-disc ratio; HVF humphrey visual field; GVF goldmann visual field; OCT optical coherence tomography; IOP intraocular pressure; BRVO Branch retinal vein occlusion; CRVO central retinal vein occlusion; CRAO central retinal artery occlusion; BRAO branch retinal artery occlusion; RT retinal tear; SB scleral buckle; PPV pars plana vitrectomy; VH Vitreous hemorrhage; PRP panretinal laser photocoagulation; IVK intravitreal kenalog; VMT vitreomacular traction; MH Macular hole;  NVD neovascularization of the disc; NVE neovascularization elsewhere; AREDS age related eye disease study; ARMD age related macular degeneration; POAG primary open angle glaucoma; EBMD epithelial/anterior basement membrane dystrophy; ACIOL anterior chamber intraocular lens; IOL intraocular lens; PCIOL posterior chamber intraocular lens; Phaco/IOL phacoemulsification with intraocular lens placement; PRK photorefractive keratectomy; LASIK laser assisted in situ keratomileusis; HTN hypertension; DM diabetes mellitus; COPD chronic obstructive pulmonary disease

## 2019-04-14 ENCOUNTER — Ambulatory Visit (INDEPENDENT_AMBULATORY_CARE_PROVIDER_SITE_OTHER): Payer: Medicare Other | Admitting: Ophthalmology

## 2019-04-14 ENCOUNTER — Other Ambulatory Visit: Payer: Self-pay

## 2019-04-14 ENCOUNTER — Encounter (INDEPENDENT_AMBULATORY_CARE_PROVIDER_SITE_OTHER): Payer: Self-pay | Admitting: Ophthalmology

## 2019-04-14 DIAGNOSIS — H3581 Retinal edema: Secondary | ICD-10-CM

## 2019-04-14 DIAGNOSIS — H35033 Hypertensive retinopathy, bilateral: Secondary | ICD-10-CM

## 2019-04-14 DIAGNOSIS — H353221 Exudative age-related macular degeneration, left eye, with active choroidal neovascularization: Secondary | ICD-10-CM

## 2019-04-14 DIAGNOSIS — H353211 Exudative age-related macular degeneration, right eye, with active choroidal neovascularization: Secondary | ICD-10-CM

## 2019-04-14 DIAGNOSIS — I10 Essential (primary) hypertension: Secondary | ICD-10-CM

## 2019-04-14 DIAGNOSIS — Z961 Presence of intraocular lens: Secondary | ICD-10-CM

## 2019-04-14 MED ORDER — BEVACIZUMAB CHEMO INJECTION 1.25MG/0.05ML SYRINGE FOR KALEIDOSCOPE
1.2500 mg | INTRAVITREAL | Status: AC | PRN
  Administered 2019-04-14: 1.25 mg via INTRAVITREAL

## 2019-04-16 ENCOUNTER — Other Ambulatory Visit: Payer: Self-pay | Admitting: Cardiology

## 2019-04-21 ENCOUNTER — Telehealth: Payer: Self-pay | Admitting: Cardiology

## 2019-04-21 MED ORDER — HYDRALAZINE HCL 10 MG PO TABS: 10.0000 mg | ORAL_TABLET | Freq: Four times a day (QID) | ORAL | 1 refills | Status: DC | PRN

## 2019-04-21 NOTE — Addendum Note (Signed)
Addended by: Merlene Laughter on: 04/21/2019 02:56 PM   Modules accepted: Orders

## 2019-04-21 NOTE — Telephone Encounter (Signed)
Patient informed and verbalized understanding

## 2019-04-21 NOTE — Telephone Encounter (Addendum)
Patient says she forgot to mention that last Wednesday while exercising (stationary bike), she became sob x's1. Denies any other episodes of sob since that time. Denies current sob. Advised to monitor for worsening symptoms, sob with activity and contact our office back for a sooner appointment if this occurs. Verbalized understanding.

## 2019-04-21 NOTE — Telephone Encounter (Signed)
°  Pt c/o BP issue: STAT if pt c/o blurred vision, one-sided weakness or slurred speech  1. What are your last 5 BP readings?  8:30am 09/18 136/80 HR 78 5:00 am 09/18 166/80 HR 66 1:30 am 09/18  166/85 HR 60 10:00 pm 09/17 155/85 HR 68 7:40 pm 09/17 174/95 HR 62 7:00 pm 09/17 180/92 HR 58   2. Are you having any other symptoms (ex. Dizziness, headache, blurred vision, passed out)? No  3. What is your BP issue? Pt was told to take hydralazine when her BP was high (over 160). At 7 pm yesterday she took one dose, and when her BP did not go down, she took a second dose at 8 pm.Pt was dizzy, which was what caused her to take her BP. Since she took two doses last night (7 pm and 8 pm), is it safe for her to take another dose this morning? Her BP is a little high, but not as high as it was last night.   She also has 25 mg tablets of hydralazine on hand, but when she takes that dose, her BP goes too low (around 100). She has a stent in her LAD artery, and at her last heart cath 1.5 years ago, she had 45% blockage in her small arteries leading up to her LAD.   Patient does not report any dizziness or other side effects at the time of call.  She is scheduled for Physical Therapy today, which depending on how she feels she may skip.   Patient also reports a higher than normal HR for her. She states that her HR is normally in the 50s

## 2019-04-21 NOTE — Telephone Encounter (Signed)
Last BP 8:30 am was 136/80-advised that she did not need hydralazine at this time Reports feeling dizzy yesterday while sitting and talking.Took BP afterwards (one hour later)04/20/2019  7:00 pm 180/92 HR 58 @ 7:40 pm 174/95 HR 62 and took second hydralazine 10 mg & 8:00 pm BP 161/83.  Has hydralazine 10 mg BID prn for SBP >160 and also has 25 mg tablets to take as needed. Reports the 25 mg causes her BP to drop too low so she does not take the 25 mg hydralazine.  Denies dizziness today. Denies chest pain or sob.  Advised to continue monitoring her symptoms, stay well hydrated. Advised to check her BP when she feels dizzy vs waiting. Appointment given to see Dr. Percival Spanish on 05/10/2019. Advised if symptoms get worse, to contact our office or go to the ED for an evaluation. Verbalized understanding of plan.

## 2019-04-21 NOTE — Telephone Encounter (Signed)
She can take an extra hydralazine more frequently (Up to QID) as needed .

## 2019-04-23 NOTE — Telephone Encounter (Signed)
No new testing suggested.

## 2019-05-09 NOTE — Progress Notes (Signed)
HPI The patient presents for followup up of CAD.  She had a PCI of LAD in 2012. She recently underwent total right hip arthroplasty. She presented to the hospital on 06/12/2017 with sudden onset of right-sided neck pain with radiation to the right jaw. CTA of the chest was negative for PE however troponin was elevated at 0.42. Echocardiogram was obtained on 06/13/2017 showed EF 60-65%, mild LVH, grade 1 DD, mild MR, mild TR, peak PA pressure 30 mmHg. Cardiac catheterization performed on 06/14/2017 showed 95% ostial D1 lesion, widely patent ostial LAD stent, 30% proximal LAD stenosis. Medical therapy was recommended. Imdur 60 mg daily was added to her medical regimen. Beta blocker was initially increased and later reduced due to symptomatic bradycardia. At a previous visit I stopped the beta blocker. Since I last saw her she has had to use hydralazine about 12 times or so.  She called on the 18th because on the 12th her blood pressure had gone up to the 170s.  She has had on a few occasions to take the hydralazine and then her blood pressure comes down.  Denies any presyncope or syncope.  She says she knows what it was a trigger.  She has knee pain and that triggers her blood pressure particular flu shot and treated her blood pressure.  It comes down nicely when she takes her hydralazine.     Allergies  Allergen Reactions  . Acyclovir And Related Other (See Comments)    unknown  . Pravastatin Sodium Other (See Comments)    cystitis  . Zocor [Simvastatin] Other (See Comments)    cystitis    Current Outpatient Medications  Medication Sig Dispense Refill  . aspirin EC 81 MG tablet Take 81 mg by mouth daily.    . Biotin w/ Vitamins C & E (HAIR/SKIN/NAILS PO) Take 1 tablet by mouth daily.    . hydrALAZINE (APRESOLINE) 10 MG tablet Take 1 tablet (10 mg total) by mouth 4 (four) times daily as needed (elevated blood pressure). For elevated BP >160 120 tablet 1  . isosorbide mononitrate (IMDUR) 60  MG 24 hr tablet TAKE 1 TABLET ONCE DAILY. 30 tablet 11  . Multiple Vitamins-Calcium (ONE-A-DAY WOMENS PO) Take 1 tablet by mouth daily.     . Multiple Vitamins-Minerals (PRESERVISION AREDS 2 PO) Take 1 capsule by mouth 2 (two) times daily.    . nitroGLYCERIN (NITROSTAT) 0.4 MG SL tablet Place 1 tablet (0.4 mg total) under the tongue every 5 (five) minutes as needed for chest pain. 25 tablet 8  . PRESCRIPTION MEDICATION Avastin eye injections given by Dr Coralyn Pear due to macular degeneration    . rosuvastatin (CRESTOR) 10 MG tablet TAKE (1/2) TABLET DAILY. 60 tablet 0  . SYNTHROID 50 MCG tablet TAKE 1 TABLET EACH DAY. 90 tablet 0   No current facility-administered medications for this visit.     Past Medical History:  Diagnosis Date  . Arthritis    DDD, scoliosis, sees Dr. Arnoldo Morale for this, uses norco very rarely for pain  . Bradycardia 11/11/2017  . CAD (coronary artery disease)    LAD stenting of a 90% lesion 2012  . Cystocele   . Elevated cholesterol   . GERD (gastroesophageal reflux disease)    dx in work up 2016 for atypical CP at OSH   pt. denies  . Heart murmur   . Hypertensive retinopathy    OU  . Hypothyroidism   . Interstitial cystitis    sees Dr. Jeffie Pollock  .  Macular degeneration    OU  . NSTEMI (non-ST elevated myocardial infarction) (HCC)   . Osteoporosis   . Rectocele   . 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 Scoliosis   . Thyroid disease    Hypothyroid  . Urinary incontinence    USI  . Uterine prolapse     Past Surgical History:  Procedure Laterality Date  . BLADDER SUSPENSION  2011  . CATARACT EXTRACTION Bilateral 2015   Dr. Elmer Picker  . CORONARY ANGIOPLASTY WITH STENT PLACEMENT  04/21/2010   LAD 80%, RCA 30%, nl EF, s/p DES LAD  . EYE SURGERY    . LEFT HEART CATH AND CORONARY ANGIOGRAPHY N/A 06/14/2017   Procedure: LEFT HEART CATH AND CORONARY ANGIOGRAPHY;  Surgeon: Runell Gess, MD;  [facility];  Service: Cardiovascular;  Laterality:  N/A;  . OOPHORECTOMY  2011   BSO  . TOTAL HIP ARTHROPLASTY Right 06/10/2017   Procedure: RIGHT TOTAL HIP ARTHROPLASTY ANTERIOR APPROACH;  Surgeon: Samson Frederic, MD;  Location: [facility];  Service: Orthopedics;  Laterality: Right;  Needs RNFA  . VAGINAL HYSTERECTOMY  2011   LAVH BSO; benign    ROS:   As stated in the HPI and negative for all other systems.    PHYSICAL EXAM BP (!) 148/62   Pulse (!) 47   Temp (!) 97 F (36.1 C)   Ht 5\' 3"  (1.6 m)   Wt 134 lb (60.8 kg)   SpO2 96%   BMI 23.74 kg/m   GENERAL:  Well appearing NECK:  No jugular venous distention, waveform within normal limits, carotid upstroke brisk and symmetric, no bruits, no thyromegaly LUNGS:  Clear to auscultation bilaterally CHEST:  Unremarkable HEART:  PMI not displaced or sustained,S1 and S2 within normal limits, no S3, no S4, no clicks, no rubs, no murmurs ABD:  Flat, positive bowel sounds normal in frequency in pitch, no bruits, no rebound, no guarding, no midline pulsatile mass, no hepatomegaly, no splenomegaly EXT:  2 plus pulses throughout, no edema, no cyanosis no clubbing    EKG:  NA  Lab Results  Component Value Date   CHOL 154 03/31/2019   TRIG 89.0 03/31/2019   HDL 57.50 03/31/2019   LDLCALC 79 03/31/2019   LDLDIRECT 140.6 05/25/2012     ASSESSMENT AND PLAN   CAD, NATIVE VESSEL -  The patient has no new sypmtoms.  No further cardiovascular testing is indicated.  We will continue with aggressive risk reduction and meds as listed.ontinue with aggressive risk reduction and meds as listed.  HYPERTENSION -  She feels comfortable taking hydralazine as needed as she is doing.  We went over extensive diary.  It comes down nicely.  Other times is low so she does not want take it scheduled.  No change in therapy.  She will back off taking her blood pressure readings so frequently.  Hyperlipidemia - Her LDL is came down with an LDL of 79.  This is lower than previous.  She can remain on full dose  of Crestor.

## 2019-05-10 ENCOUNTER — Other Ambulatory Visit: Payer: Self-pay | Admitting: Family Medicine

## 2019-05-10 ENCOUNTER — Other Ambulatory Visit: Payer: Self-pay

## 2019-05-10 ENCOUNTER — Ambulatory Visit: Payer: Medicare Other | Admitting: Cardiology

## 2019-05-10 ENCOUNTER — Encounter: Payer: Self-pay | Admitting: Cardiology

## 2019-05-10 VITALS — BP 148/62 | HR 47 | Temp 97.0°F | Ht 63.0 in | Wt 134.0 lb

## 2019-05-10 DIAGNOSIS — I10 Essential (primary) hypertension: Secondary | ICD-10-CM

## 2019-05-10 DIAGNOSIS — E785 Hyperlipidemia, unspecified: Secondary | ICD-10-CM

## 2019-05-10 DIAGNOSIS — I2510 Atherosclerotic heart disease of native coronary artery without angina pectoris: Secondary | ICD-10-CM

## 2019-05-10 NOTE — Patient Instructions (Signed)
Medication Instructions:  Your physician recommends that you continue on your current medications as directed. Please refer to the Current Medication list given to you today.  If you need a refill on your cardiac medications before your next appointment, please call your pharmacy.   Lab work: NONE  Testing/Procedures: NONE  Follow-Up: At [facility], you and your health needs are our priority.  As part of our continuing mission to provide you with exceptional heart care, we have created designated Provider Care Teams.  These Care Teams include your primary Cardiologist (physician) and Advanced Practice Providers (APPs -  Physician Assistants and Nurse Practitioners) who all work together to provide you with the care you need, when you need it. You will need a follow up appointment in 6 months.  Please call our office 2 months in advance to schedule this appointment.  You may see James Hochrein, MD or one of the following Advanced Practice Providers on your designated Care Team:   Rhonda Barrett, PA-C Kathryn Lawrence, DNP, ANP       

## 2019-05-12 ENCOUNTER — Encounter (INDEPENDENT_AMBULATORY_CARE_PROVIDER_SITE_OTHER): Payer: Medicare Other | Admitting: Ophthalmology

## 2019-05-17 NOTE — Progress Notes (Signed)
[facility] - Clinic Note  05/22/2019     CHIEF COMPLAINT Patient presents for Retina Follow Up   HISTORY OF PRESENT ILLNESS: Kaitlyn Parks is a 84 y.o. female who presents to the clinic today for:   HPI    Retina Follow Up    Patient presents with  Wet AMD.  In both eyes.  This started months ago.  Severity is moderate.  Duration of 5.5 weeks.  Since onset it is stable.  I, the attending physician,  performed the HPI with the patient and updated documentation appropriately.          Comments    84 y/o female pt here for 5.5 wk f/u for exu ARMD OU.  No change in [place] OU.  Felt pain in OS during injection at last visit, which had never occurred before.  Denies pain, flashes, floaters.  Uses Soothe gtts prn OU.       Last edited by Rennis ChrisZamora, Lynett Brasil, MD on 05/22/2019  3:13 PM. (History)    pt states she is delayed to follow up for personal reasons, pt states when she received the last in  Referring physician: Demarco, [place], OD [address] [place] [id number]  HISTORICAL INFORMATION:   Selected notes from the MEDICAL RECORD NUMBER Referred by Dr. [place] DeMarco for concern of exu ARMD LEE: 11.26.19 (J. 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 (Ophthalmic Drugs)   No current facility-administered medications for this visit.  (Ophthalmic Drugs)   Current Outpatient Medications (Other)  Medication Sig  . aspirin EC 81 MG tablet Take 81 mg by mouth daily.  . Biotin w/ Vitamins C & E (HAIR/SKIN/NAILS PO) Take 1 tablet by mouth daily.  . hydrALAZINE (APRESOLINE) 10 MG tablet Take 1 tablet (10 mg total) by mouth 4 (four) times daily as needed (elevated blood pressure). For elevated BP >160  . isosorbide mononitrate (IMDUR) 60 MG 24 hr tablet TAKE 1 TABLET ONCE DAILY.  . Multiple  Vitamins-Calcium (ONE-A-DAY WOMENS PO) Take 1 tablet by mouth daily.   . Multiple Vitamins-Minerals (PRESERVISION AREDS 2 PO) Take 1 capsule by mouth 2 (two) times daily.  . nitroGLYCERIN (NITROSTAT) 0.4 MG SL tablet Place 1 tablet (0.4 mg total) under the tongue every 5 (five) minutes as needed for chest pain.  Marland Kitchen. PRESCRIPTION MEDICATION Avastin eye injections given by Dr Vanessa BarbaraZamora due to macular degeneration  . rosuvastatin (CRESTOR) 10 MG tablet TAKE (1/2) TABLET DAILY.  Marland Kitchen. sulfamethoxazole-trimethoprim (BACTRIM DS) 800-160 MG tablet   . SYNTHROID 50 MCG tablet TAKE 1 TABLET EACH DAY.   No current facility-administered medications for this visit.  (Other)      REVIEW OF SYSTEMS: ROS    Positive for: Gastrointestinal, Musculoskeletal, Cardiovascular, Eyes   Negative for: Constitutional, Neurological, Skin, Genitourinary, HENT, Endocrine, Respiratory, Psychiatric, Allergic/Imm, Heme/Lymph   Last edited by Celine MansBaxley, Andrew G, COA on 05/22/2019  3:06 PM. (History)       ALLERGIES Allergies  Allergen Reactions  . Acyclovir And Related Other (See Comments)    unknown  . Pravastatin Sodium Other (See Comments)    cystitis  . Zocor [Simvastatin] Other (See Comments)    cystitis    PAST MEDICAL HISTORY Past Medical History:  Diagnosis Date  . Arthritis    DDD, scoliosis, sees Dr. Lovell SheehanJenkins for this, uses norco very rarely for  pain  . Bradycardia 11/11/2017  . CAD (coronary artery disease)    LAD stenting of a 90% lesion 2012  . Cystocele   . Elevated cholesterol   . GERD (gastroesophageal reflux disease)    dx in work up 2016 for atypical CP at OSH   pt. denies  . Heart murmur   . Hypertensive retinopathy    OU  . Hypothyroidism   . Interstitial cystitis    sees Dr. [name]  . Macular degeneration    OU  . NSTEMI (non-ST elevated myocardial infarction) (HCC)   . Osteoporosis   . Rectocele   . 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 Scoliosis   . Thyroid disease     Hypothyroid  . Urinary incontinence    USI  . Uterine prolapse    Past Surgical History:  Procedure Laterality Date  . BLADDER SUSPENSION  2011  . CATARACT EXTRACTION Bilateral 2015   Dr. Elmer Picker  . CORONARY ANGIOPLASTY WITH STENT PLACEMENT  04/21/2010   LAD 80%, RCA 30%, nl EF, s/p DES LAD  . EYE SURGERY    . LEFT HEART CATH AND CORONARY ANGIOGRAPHY N/A 06/14/2017   Procedure: LEFT HEART CATH AND CORONARY ANGIOGRAPHY;  Surgeon: Runell Gess, MD;  [facility];  Service: Cardiovascular;  Laterality: N/A;  . OOPHORECTOMY  2011   BSO  . TOTAL HIP ARTHROPLASTY Right 06/10/2017   Procedure: RIGHT TOTAL HIP ARTHROPLASTY ANTERIOR APPROACH;  Surgeon: Samson Frederic, MD;  Location: [facility];  Service: Orthopedics;  Laterality: Right;  Needs RNFA  . VAGINAL HYSTERECTOMY  2011   LAVH BSO; benign    FAMILY HISTORY Family History  Problem Relation Age of Onset  . Hypertension Mother   . Heart disease Mother   . Heart disease Father   . Heart disease Sister   . Colon cancer Paternal Aunt 45  . Breast cancer Paternal Aunt        Age 18's  . Diabetes Sister   . Endometrial cancer Sister   . Breast cancer Cousin        Maternal 1st cousins-Age 26's  . Leukemia Paternal Aunt     SOCIAL HISTORY Social History   Tobacco Use  . Smoking status: Never Smoker  . Smokeless tobacco: Never Used  Substance Use Topics  . Alcohol use: No    Comment: Rare  . Drug use: No         OPHTHALMIC EXAM:  Base Eye Exam    Visual Acuity (Snellen - Linear)      Right Left   Dist [place] 20/30 20/30 +   Dist ph [place] NI NI       Tonometry (Tonopen, 3:08 PM)      Right Left   Pressure 10 12       Pupils      Dark Light Shape React APD   Right 4 3 Round Brisk None   Left 4 3 Round Brisk None       Visual Fields (Counting fingers)      Left Right    Full Full       Extraocular Movement      Right Left    Full, Ortho Full, Ortho       Neuro/Psych    Oriented x3: Yes    Mood/Affect: Normal       Dilation    Both eyes: 1.0% Mydriacyl, 2.5% Phenylephrine @ 3:08 PM        Slit Lamp  and Fundus Exam    Slit Lamp Exam      Right Left   Lids/Lashes mild Telangiectasia, marginal leasion nasal UL Telangiectasia, Meibomian gland dysfunction   Conjunctiva/Sclera White and quiet White and quiet   Cornea Arcus, 1 Punctate epithelial erosions 1-2+ Punctate epithelial erosions, Arcus   Anterior Chamber Deep and clear Deep and clear   Iris Round and dilated Round and well dilated   Lens PC IOL in good position PC IOL in excellent position   Vitreous Vitreous syneresis, Posterior vitreous detachment Vitreous syneresis, Posterior vitreous detachment, vitreous condensations       Fundus Exam      Right Left   Disc Pink and Sharp, mild PPP, peripapillary heme at 1200--persistent Pink and Sharp, mild temporal PPA   C/D Ratio 0.4 0.3   Macula Blunted foveal reflex, Drusen, RPE mottling and clumping, early Atrophy, +PEDs,  Interval improvement of central cyst / IRF, No heme Blunted foveal reflex, +drusen, pigment clumping, stable resolution of central edema, RPE mottling, clumping and early atrophy, no heme   Vessels Vascular attenuation Vascular attenuation   Periphery Attached, scattered reticular degeneration   Attached, scattered reticular degeneration          IMAGING AND PROCEDURES  Imaging and Procedures for @TODAY @  OCT, Retina - OU - Both Eyes       Right Eye Quality was good. Central Foveal Thickness: 281. Progression has improved. Findings include abnormal foveal contour, retinal drusen , pigment epithelial detachment, outer retinal atrophy, intraretinal hyper-reflective material, no SRF, intraretinal fluid (Interval improvement in IRF; Stable PED).   Left Eye Quality was good. Central Foveal Thickness: 232. Progression has been stable. Findings include retinal drusen , outer retinal atrophy, pigment epithelial detachment, intraretinal  hyper-reflective material, normal foveal contour, no SRF, no IRF (Stable resolution of bullous PED and IRF/SRF -- now just central ORA and flattened PED).   Notes *Images captured and stored on drive  Diagnosis / Impression:  OD: exudative ARMD -- Interval improvement in IRF; Stable PED OS: exu-ARMD -- stable resolution of bullous PED and IRF/SRF -- now just central ORA and flattened PED   Clinical management:  See below  Abbreviations: NFP - Normal foveal profile. CME - cystoid macular edema. PED - pigment epithelial detachment. IRF - intraretinal fluid. SRF - subretinal fluid. EZ - ellipsoid zone. ERM - epiretinal membrane. ORA - outer retinal atrophy. ORT - outer retinal tubulation. SRHM - subretinal hyper-reflective material        Intravitreal Injection, Pharmacologic Agent - OS - Left Eye       Time Out 05/22/2019. 3:02 PM. Confirmed correct patient, procedure, site, and patient consented.   Anesthesia Topical anesthesia was used. Anesthetic medications included Lidocaine 2%, Proparacaine 0.5%.   Procedure Preparation included 5% betadine to ocular surface, eyelid speculum. A supplied needle was used.   Injection:  1.25 mg Bevacizumab (AVASTIN) SOLN   NDC: 76546-503-54, Lot: [id number] , Expiration date: 07/19/2019   Route: Intravitreal, Site: Left Eye, Waste: 0 mL  Post-op Post injection exam found visual acuity of at least counting fingers. The patient tolerated the procedure well. There were no complications. The patient received written and verbal post procedure care education.        Intravitreal Injection, Pharmacologic Agent - OD - Right Eye       Time Out 05/22/2019. 3:56 PM. Confirmed correct patient, procedure, site, and patient consented.   Anesthesia Topical anesthesia was used. Anesthetic medications included Lidocaine 2%, Proparacaine 0.5%.   Procedure  Preparation included 5% betadine to ocular surface, eyelid speculum. A supplied needle was  used.   Injection:  1.25 mg Bevacizumab (AVASTIN) SOLN   NDC: 90240-973-53, Lot: [id number] , Expiration date: 07/19/2019   Route: Intravitreal, Site: Right Eye, Waste: 0 mL  Post-op Post injection exam found visual acuity of at least counting fingers. The patient tolerated the procedure well. There were no complications. The patient received written and verbal post procedure care education.                 ASSESSMENT/PLAN:    ICD-10-CM   1. Exudative age-related macular degeneration of left eye with active choroidal neovascularization (HCC)  H35.3221 Intravitreal Injection, Pharmacologic Agent - OS - Left Eye    Bevacizumab (AVASTIN) SOLN 1.25 mg  2. Retinal edema  H35.81 OCT, Retina - OU - Both Eyes  3. Exudative age-related macular degeneration of right eye with active choroidal neovascularization (HCC)  H35.3211 Intravitreal Injection, Pharmacologic Agent - OD - Right Eye    Bevacizumab (AVASTIN) SOLN 1.25 mg  4. Essential hypertension  I10   5. Hypertensive retinopathy of both eyes  H35.033   6. Pseudophakia of both eyes  Z96.1     1,2. Exudative age-related macular degeneration, left eye.    - OCT 4.30.2020 showed interval conversion of OD from nonexudative ARMD to exu-ARMD with large dome-shaped PED with overlying IRF/SRF--stable  - s/p IVA OS #1 (04.30.20), #2 (05.28.20), #3 (06.26.20), #4 (08.03.20), #5 (09.11.20)  - today, delayed follow up from 4 weeks to 5  - today, stable resolution of PED w/ overlying IRF/SRF -- now flattened PED and ORA  - benefits investigation for Eylea initiated 4.30.2020 -- approved as of 5.28.2020  - FA 5.28.2020 -- no active CNV OS, just staining  - discussed findings and excellent response to IVA  - recommend IVA OS #6 today (10.19.20) -- maintenance  - RBA of procedure discussed, questions answered  - informed consent obtained and signed  - see procedure note  - f/u in 5 wks -- DFE/OCT  3. Age related macular degeneration,  exudative, OD  - interval development of IRF first noted on 09.11.20 -- conversion from nonexudative to exudative ARMD  - pt initially presented on 11.27.19 due to alert from [facility] home monitoring system for OD, but was unable to stay for fluorescein angiogram at that time  - [facility] prescribed by Dr. 11.29.19  - FA (5.28.2020) showed staining / window defect corresponding temporal RPE changes OU -- no active CNV OU  - S/P IVA OD #1 (09.11.20)  - today, delayed follow up from 4 weeks to 5   - OCT shows interval improvement in IRF; stable PEDs  - BCVA stable at 20/30 OD  - recommend IVA OD #2 today, 10.19.20  - pt wishes to proceed  - RBA of procedure discussed, questions answered  - informed consent obtained and signed  - see procedure note  - f/u 5 weeks  4,5. Hypertensive retinopathy OU  - discussed importance of tight BP control  - monitor  6. Pseudophakia OU  - s/p CE/IOL OU  - beautiful surgeries, doing well  - monitor   Ophthalmic Meds Ordered this visit:  Meds ordered this encounter  Medications  . Bevacizumab (AVASTIN) SOLN 1.25 mg  . Bevacizumab (AVASTIN) SOLN 1.25 mg       Return in about 5 weeks (around 06/26/2019) for exu ARMD OU, DFE, OCT.  There are no Patient Instructions on file for this visit.   Explained the  diagnoses, plan, and follow up with the patient and they expressed understanding.  Patient expressed understanding of the importance of proper follow up care.   This document serves as a record of services personally performed by Karie Chimera, MD, PhD. It was created on their behalf by Annalee Genta, COMT. The creation of this record is the provider's dictation and/or activities during the visit.  Electronically signed by: Annalee Genta, COMT 05/22/19 4:41 PM   This document serves as a record of services personally performed by Karie Chimera, MD, PhD. It was created on their behalf by Laurian Brim, OA, an ophthalmic assistant. The creation of  this record is the provider's dictation and/or activities during the visit.    Electronically signed by: Laurian Brim, OA 10.19.2020 4:41 PM  Karie Chimera, M.D., Ph.D. Diseases & Surgery of the Retina and Vitreous [facility] 05/22/19  I have reviewed the above documentation for accuracy and completeness, and I agree with the above. Karie Chimera, M.D., Ph.D. 05/22/19 4:41 PM    Abbreviations: M myopia (nearsighted); A astigmatism; H hyperopia (farsighted); P presbyopia; Mrx spectacle prescription;  CTL contact lenses; OD right eye; OS left eye; OU both eyes  XT exotropia; ET esotropia; PEK punctate epithelial keratitis; PEE punctate epithelial erosions; DES dry eye syndrome; MGD meibomian gland dysfunction; ATs artificial tears; PFAT's preservative free artificial tears; NSC nuclear sclerotic cataract; PSC posterior subcapsular cataract; ERM epi-retinal membrane; PVD posterior vitreous detachment; RD retinal detachment; DM diabetes mellitus; DR diabetic retinopathy; NPDR non-proliferative diabetic retinopathy; PDR proliferative diabetic retinopathy; CSME clinically significant macular edema; DME diabetic macular edema; dbh dot blot hemorrhages; CWS cotton wool spot; POAG primary open angle glaucoma; C/D cup-to-disc ratio; HVF humphrey visual field; GVF goldmann visual field; OCT optical coherence tomography; IOP intraocular pressure; BRVO Branch retinal vein occlusion; CRVO central retinal vein occlusion; CRAO central retinal artery occlusion; BRAO branch retinal artery occlusion; RT retinal tear; SB scleral buckle; PPV pars plana vitrectomy; VH Vitreous hemorrhage; PRP panretinal laser photocoagulation; IVK intravitreal kenalog; VMT vitreomacular traction; MH Macular hole;  NVD neovascularization of the disc; NVE neovascularization elsewhere; AREDS age related eye disease study; ARMD age related macular degeneration; POAG primary open angle glaucoma; EBMD  epithelial/anterior basement membrane dystrophy; ACIOL anterior chamber intraocular lens; IOL intraocular lens; PCIOL posterior chamber intraocular lens; Phaco/IOL phacoemulsification with intraocular lens placement; PRK photorefractive keratectomy; LASIK laser assisted in situ keratomileusis; HTN hypertension; DM diabetes mellitus; COPD chronic obstructive pulmonary disease

## 2019-05-22 ENCOUNTER — Ambulatory Visit (INDEPENDENT_AMBULATORY_CARE_PROVIDER_SITE_OTHER): Payer: Medicare Other | Admitting: Ophthalmology

## 2019-05-22 ENCOUNTER — Encounter (INDEPENDENT_AMBULATORY_CARE_PROVIDER_SITE_OTHER): Payer: Self-pay | Admitting: Ophthalmology

## 2019-05-22 ENCOUNTER — Other Ambulatory Visit: Payer: Self-pay

## 2019-05-22 DIAGNOSIS — H353221 Exudative age-related macular degeneration, left eye, with active choroidal neovascularization: Secondary | ICD-10-CM

## 2019-05-22 DIAGNOSIS — H3581 Retinal edema: Secondary | ICD-10-CM

## 2019-05-22 DIAGNOSIS — H35033 Hypertensive retinopathy, bilateral: Secondary | ICD-10-CM

## 2019-05-22 DIAGNOSIS — H353211 Exudative age-related macular degeneration, right eye, with active choroidal neovascularization: Secondary | ICD-10-CM

## 2019-05-22 DIAGNOSIS — I10 Essential (primary) hypertension: Secondary | ICD-10-CM

## 2019-05-22 DIAGNOSIS — Z961 Presence of intraocular lens: Secondary | ICD-10-CM

## 2019-05-22 MED ORDER — BEVACIZUMAB CHEMO INJECTION 1.25MG/0.05ML SYRINGE FOR KALEIDOSCOPE
1.2500 mg | INTRAVITREAL | Status: AC | PRN
  Administered 2019-05-22: 1.25 mg via INTRAVITREAL

## 2019-05-22 MED ORDER — BEVACIZUMAB CHEMO INJECTION 1.25MG/0.05ML SYRINGE FOR KALEIDOSCOPE
1.2500 mg | INTRAVITREAL | Status: AC | PRN
  Administered 2019-05-22: 16:00:00 1.25 mg via INTRAVITREAL

## 2019-06-20 NOTE — Progress Notes (Addendum)
[facility]  06/26/2019     CHIEF COMPLAINT Patient presents for Retina Follow Up   HISTORY OF PRESENT ILLNESS: Kaitlyn Parks is a 84 y.o. female who presents to the clinic today for:   HPI    Retina Follow Up    Patient presents with  Wet AMD.  In both eyes.  This started 5 weeks ago.  Severity is moderate.  I, the attending physician,  performed the HPI with the patient and updated documentation appropriately.          Comments    Patient here for 5 weeks retina follow up for exu ARMD OU. Patient states vision doing pretty good. A little fuzzy since started this. On occasion has eye pain. Not today.       Last edited by Bernarda Caffey, MD on 06/26/2019  3:03 PM. (History)    pt states  Referring physician: Dem[place], [place], [address] [place],  [place] [address]  HISTORICAL INFORMATION:   Selected notes from the MEDICAL RECORD NUMBER Referred by Dr. [place] DeMarco for concern of exu ARMD LEE: 11.26.19 (J. 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 (Ophthalmic Drugs)   No current facility-administered medications for this visit.  (Ophthalmic Drugs)   Current Outpatient Medications (Other)  Medication Sig  . aspirin EC 81 MG tablet Take 81 mg by mouth daily.  . Biotin w/ Vitamins C & E (HAIR/SKIN/NAILS PO) Take 1 tablet by mouth daily.  . hydrALAZINE (APRESOLINE) 10 MG tablet Take 1 tablet (10 mg total) by mouth 4 (four) times daily as needed (elevated blood pressure). For elevated BP >160  . isosorbide mononitrate (IMDUR) 60 MG 24 hr tablet TAKE 1 TABLET ONCE DAILY.  . Multiple Vitamins-Calcium (ONE-A-DAY WOMENS PO) Take 1 tablet by mouth daily.   . Multiple Vitamins-Minerals (PRESERVISION AREDS 2 PO) Take 1 capsule by mouth 2 (two) times daily.  . nitroGLYCERIN  (NITROSTAT) 0.4 MG SL tablet Place 1 tablet (0.4 mg total) under the tongue every 5 (five) minutes as needed for chest pain.  Marland Kitchen PRESCRIPTION MEDICATION Avastin eye injections given by Dr Coralyn Pear due to macular degeneration  . rosuvastatin (CRESTOR) 10 MG tablet TAKE (1/2) TABLET DAILY.  Marland Kitchen sulfamethoxazole-trimethoprim (BACTRIM DS) 800-160 MG tablet   . SYNTHROID 50 MCG tablet TAKE 1 TABLET EACH DAY.   No current facility-administered medications for this visit.  (Other)      REVIEW OF SYSTEMS: ROS    Positive for: Gastrointestinal, Genitourinary, Musculoskeletal, Cardiovascular, Eyes   Negative for: Constitutional, Neurological, Skin, HENT, Endocrine, Respiratory, Psychiatric, Allergic/Imm, Heme/Lymph   Last edited by Theodore Demark, COA on 06/26/2019  1:19 PM. (History)       ALLERGIES Allergies  Allergen Reactions  . Acyclovir And Related Other (See Comments)    unknown  . Pravastatin Sodium Other (See Comments)    cystitis  . Zocor [Simvastatin] Other (See Comments)    cystitis    PAST MEDICAL HISTORY Past Medical History:  Diagnosis Date  . Arthritis    DDD, scoliosis, sees Dr. Arnoldo Morale for this, uses norco very rarely for pain  . Bradycardia 11/11/2017  . CAD (coronary artery disease)    LAD stenting of a 90% lesion 2012  . Cystocele   . Elevated cholesterol   . GERD (gastroesophageal reflux disease)    dx in  work up 2016 for atypical CP at OSH   pt. denies  . Heart murmur   . Hypertensive retinopathy    OU  . Hypothyroidism   . Interstitial cystitis    sees Dr. [name]  . Macular degeneration    OU  . NSTEMI (non-ST elevated myocardial infarction) (HCC)   . Osteoporosis   . Rectocele   . 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 Scoliosis   . Thyroid disease    Hypothyroid  . Urinary incontinence    USI  . Uterine prolapse    Past Surgical History:  Procedure Laterality Date  . BLADDER SUSPENSION  2011  . CATARACT EXTRACTION Bilateral 2015   Dr.  Elmer Picker  . CORONARY ANGIOPLASTY WITH STENT PLACEMENT  04/21/2010   LAD 80%, RCA 30%, nl EF, s/p DES LAD  . EYE SURGERY    . LEFT HEART CATH AND CORONARY ANGIOGRAPHY N/A 06/14/2017   Procedure: LEFT HEART CATH AND CORONARY ANGIOGRAPHY;  Surgeon: Runell Gess, MD;  Location: [facility];  Service: Cardiovascular;  Laterality: N/A;  . OOPHORECTOMY  2011   BSO  . TOTAL HIP ARTHROPLASTY Right 06/10/2017   Procedure: RIGHT TOTAL HIP ARTHROPLASTY ANTERIOR APPROACH;  Surgeon: Samson Frederic, MD;  Location: [facility];  Service: Orthopedics;  Laterality: Right;  Needs RNFA  . VAGINAL HYSTERECTOMY  2011   LAVH BSO; benign    FAMILY HISTORY Family History  Problem Relation Age of Onset  . Hypertension Mother   . Heart disease Mother   . Heart disease Father   . Heart disease Sister   . Colon cancer Paternal Aunt 37  . Breast cancer Paternal Aunt        Age 57's  . Diabetes Sister   . Endometrial cancer Sister   . Breast cancer Cousin        Maternal 1st cousins-Age 8's  . Leukemia Paternal Aunt     SOCIAL HISTORY Social History   Tobacco Use  . Smoking status: Never Smoker  . Smokeless tobacco: Never Used  Substance Use Topics  . Alcohol use: No    Comment: Rare  . Drug use: No         OPHTHALMIC EXAM:  Base Eye Exam    Visual Acuity (Snellen - Linear)      Right Left   Dist [place] 20/40 -2 20/30 -1   Dist ph [place] 20/30 -2 20/30 +2       Tonometry (Tonopen, 1:15 PM)      Right Left   Pressure 12 11       Pupils      Dark Light Shape React APD   Right 4 3 Round Brisk None   Left 4 3 Round Brisk None       Visual Fields (Counting fingers)      Left Right    Full Full       Extraocular Movement      Right Left    Full, Ortho Full, Ortho       Neuro/Psych    Oriented x3: Yes   Mood/Affect: Normal       Dilation    Both eyes: 1.0% Mydriacyl, 2.5% Phenylephrine @ 1:15 PM        Slit Lamp and Fundus Exam    Slit Lamp Exam      Right Left    Lids/Lashes mild Telangiectasia, marginal leasion nasal UL Telangiectasia, Meibomian gland dysfunction   Conjunctiva/Sclera White and quiet White and quiet  Cornea Arcus, 1 Punctate epithelial erosions 1-2+ Punctate epithelial erosions, Arcus   Anterior Chamber Deep and clear Deep and clear   Iris Round and dilated Round and well dilated   Lens PC IOL in good position PC IOL in excellent position   Vitreous Vitreous syneresis, Posterior vitreous detachment Vitreous syneresis, Posterior vitreous detachment, vitreous condensations       Fundus Exam      Right Left   Disc Pink and Sharp, mild PPP, peripapillary heme at 1200--persistent Pink and Sharp, mild temporal PPA   C/D Ratio 0.4 0.3   Macula Blunted foveal reflex, Drusen, RPE mottling and clumping, early Atrophy, +PEDs, stable improvement of central cyst / IRF, No heme Blunted foveal reflex, +drusen, pigment clumping, stable resolution of central edema, RPE mottling, clumping and early atrophy, no heme   Vessels Vascular attenuation Vascular attenuation   Periphery Attached, scattered reticular degeneration   Attached, scattered reticular degeneration          IMAGING AND PROCEDURES  Imaging and Procedures for @  OCT, Retina - OU - Both Eyes       Right Eye Quality was good. Central Foveal Thickness: 267. Progression has been stable. Findings include abnormal foveal contour, retinal drusen , pigment epithelial detachment, outer retinal atrophy, intraretinal hyper-reflective material, no SRF, intraretinal fluid (Stable improvement in IRF; Stable PED).   Left Eye Quality was good. Central Foveal Thickness: 230. Progression has been stable. Findings include retinal drusen , outer retinal atrophy, pigment epithelial detachment, intraretinal hyper-reflective material, normal foveal contour, no SRF, no IRF (Stable resolution of bullous PED and IRF/SRF -- now just central ORA and flattened PED).   Notes *Images captured and  stored on drive  Diagnosis / Impression:  OD: exudative ARMD -- stable improvement in IRF; Stable PED OS: exu-ARMD -- stable resolution of bullous PED and IRF/SRF -- now just central ORA and flattened PED   Clinical management:  See below  Abbreviations: NFP - Normal foveal profile. CME - cystoid macular edema. PED - pigment epithelial detachment. IRF - intraretinal fluid. SRF - subretinal fluid. EZ - ellipsoid zone. ERM - epiretinal membrane. ORA - outer retinal atrophy. ORT - outer retinal tubulation. SRHM - subretinal hyper-reflective material        Intravitreal Injection, Pharmacologic Agent - OS - Left Eye       Time Out 06/26/2019. 1:11 PM. Confirmed correct patient, procedure, site, and patient consented.   Anesthesia Topical anesthesia was used. Anesthetic medications included Lidocaine 2%, Proparacaine 0.5%.   Procedure Preparation included 5% betadine to ocular surface, eyelid speculum. A 30 gauge needle was used.   Injection:  1.25 mg Bevacizumab (AVASTIN) SOLN   NDC: 09811-914-78, Lot: [id number] , Expiration date: 09/08/2019   Route: Intravitreal, Site: Left Eye, Waste: 0 mL  Post-op Post injection exam found visual acuity of at least counting fingers. The patient tolerated the procedure well. There were no complications. The patient received written and verbal post procedure care education.        Intravitreal Injection, Pharmacologic Agent - OD - Right Eye       Time Out 06/26/2019. 1:13 PM. Confirmed correct patient, procedure, site, and patient consented.   Anesthesia Topical anesthesia was used. Anesthetic medications included Lidocaine 2%, Proparacaine 0.5%.   Procedure Preparation included 5% betadine to ocular surface, eyelid speculum. A supplied needle was used.   Injection:  1.25 mg Bevacizumab (AVASTIN) SOLN   NDC: 61607-371-06, Lot: [id number] , Expiration date: 08/09/2019   Route: Intravitreal, Site: Right Eye,  Waste: 0  mg  Post-op Post injection exam found visual acuity of at least counting fingers. The patient tolerated the procedure well. There were no complications. The patient received written and verbal post procedure care education.                 ASSESSMENT/PLAN:    ICD-10-CM   1. Exudative age-related macular degeneration of left eye with active choroidal neovascularization (HCC)  H35.3221 Intravitreal Injection, Pharmacologic Agent - OS - Left Eye    Bevacizumab (AVASTIN) SOLN 1.25 mg  2. Retinal edema  H35.81 OCT, Retina - OU - Both Eyes  3. Exudative age-related macular degeneration of right eye with active choroidal neovascularization (HCC)  H35.3211 Intravitreal Injection, Pharmacologic Agent - OD - Right Eye    Bevacizumab (AVASTIN) SOLN 1.25 mg  4. Essential hypertension  I10   5. Hypertensive retinopathy of both eyes  H35.033   6. Pseudophakia of both eyes  Z96.1     1,2. Exudative age-related macular degeneration, left eye.    - OCT 4.30.2020 showed interval conversion of OD from nonexudative ARMD to exu-ARMD with large dome-shaped PED with overlying IRF/SRF--stable  - s/p IVA OS #1 (04.30.20), #2 (05.28.20), #3 (06.26.20), #4 (08.03.20), #5 (09.11.20), #6 (10.19.20)  - today, stable resolution of PED w/ overlying IRF/SRF -- now flattened PED and ORA  - benefits investigation for Eylea initiated 4.30.2020 -- approved as of 5.28.2020  - FA 5.28.2020 -- no active CNV OS, just staining  - discussed findings and excellent response to IVA  - recommend IVA OS #7 today (11.23.20) -- maintenance  - RBA of procedure discussed, questions answered  - informed consent obtained and signed  - see procedure note  - f/u in 6 wks -- DFE/OCT  3. Age related macular degeneration, exudative, OD  - interval development of IRF first noted on 09.11.20 -- conversion from nonexudative to exudative ARMD  - pt initially presented on 11.27.19 due to alert from [facility] home monitoring system for OD,  but was unable to stay for fluorescein angiogram at that time  - [facility] prescribed by Dr. Elmer Picker  - FA (5.28.2020) showed staining / window defect corresponding temporal RPE changes OU -- no active CNV OU  - S/P IVA OD #1 (09.11.20), #2 (10.19.20)  - OCT shows stable improvement in IRF; stable PEDs  - BCVA stable at 20/30 OD  - recommend IVA OD #3 today, 11.23.20  - pt wishes to proceed  - RBA of procedure discussed, questions answered  - informed consent obtained and signed  - see procedure note  - f/u 6 weeks  4,5. Hypertensive retinopathy OU  - discussed importance of tight BP control  - monitor  6. Pseudophakia OU  - s/p CE/IOL OU  - beautiful surgeries, doing well  - monitor   Ophthalmic Meds Ordered this visit:  Meds ordered this encounter  Medications  . Bevacizumab (AVASTIN) SOLN 1.25 mg  . Bevacizumab (AVASTIN) SOLN 1.25 mg       Return in about 6 weeks (around 08/07/2019) for f/u exu ARMD OU, DFE, OCT.  There are no Patient Instructions on file for this visit.   Explained the diagnoses, plan, and follow up with the patient and they expressed understanding.  Patient expressed understanding of the importance of proper follow up care.    This document serves as a record of services personally performed by Karie Chimera, MD, PhD. It was created on their behalf by Herby Abraham, COA, a certified ophthalmic assistant.  The creation of this record is the provider's dictation and/or activities during the visit.    Electronically signed by: Herby AbrahamAshley English, COA 11.17.2020 10:48 PM   This document serves as a record of services personally performed by Karie ChimeraBrian G. Kenlyn Lose, MD, PhD. It was created on their behalf by Laurian BrimAmanda Brown, OA, an ophthalmic assistant. The creation of this record is the provider's dictation and/or activities during the visit.    Electronically signed by: Laurian BrimAmanda Brown, OA 11.23.2020 10:48 PM   Karie ChimeraBrian G. Elleana Stillson, M.D., Ph.D. Diseases & Surgery of the  Retina and Vitreous [facility]  I have reviewed the above documentation for accuracy and completeness, and I agree with the above. Karie ChimeraBrian G. Donnisha Besecker, M.D., Ph.D. 06/27/19 10:48 PM    Abbreviations: M myopia (nearsighted); A astigmatism; H hyperopia (farsighted); P presbyopia; Mrx spectacle prescription;  CTL contact lenses; OD right eye; OS left eye; OU both eyes  XT exotropia; ET esotropia; PEK punctate epithelial keratitis; PEE punctate epithelial erosions; DES dry eye syndrome; MGD meibomian gland dysfunction; ATs artificial tears; PFAT's preservative free artificial tears; NSC nuclear sclerotic cataract; PSC posterior subcapsular cataract; ERM epi-retinal membrane; PVD posterior vitreous detachment; RD retinal detachment; DM diabetes mellitus; DR diabetic retinopathy; NPDR non-proliferative diabetic retinopathy; PDR proliferative diabetic retinopathy; CSME clinically significant macular edema; DME diabetic macular edema; dbh dot blot hemorrhages; CWS cotton wool spot; POAG primary open angle glaucoma; C/D cup-to-disc ratio; HVF humphrey visual field; GVF goldmann visual field; OCT optical coherence tomography; IOP intraocular pressure; BRVO Branch retinal vein occlusion; CRVO central retinal vein occlusion; CRAO central retinal artery occlusion; BRAO branch retinal artery occlusion; RT retinal tear; SB scleral buckle; PPV pars plana vitrectomy; VH Vitreous hemorrhage; PRP panretinal laser photocoagulation; IVK intravitreal kenalog; VMT vitreomacular traction; MH Macular hole;  NVD neovascularization of the disc; NVE neovascularization elsewhere; AREDS age related eye disease study; ARMD age related macular degeneration; POAG primary open angle glaucoma; EBMD epithelial/anterior basement membrane dystrophy; ACIOL anterior chamber intraocular lens; IOL intraocular lens; PCIOL posterior chamber intraocular lens; Phaco/IOL phacoemulsification with intraocular lens placement; PRK  photorefractive keratectomy; LASIK laser assisted in situ keratomileusis; HTN hypertension; DM diabetes mellitus; COPD chronic obstructive pulmonary disease

## 2019-06-26 ENCOUNTER — Other Ambulatory Visit: Payer: Self-pay

## 2019-06-26 ENCOUNTER — Ambulatory Visit (INDEPENDENT_AMBULATORY_CARE_PROVIDER_SITE_OTHER): Payer: Medicare Other | Admitting: Ophthalmology

## 2019-06-26 ENCOUNTER — Encounter (INDEPENDENT_AMBULATORY_CARE_PROVIDER_SITE_OTHER): Payer: Self-pay | Admitting: Ophthalmology

## 2019-06-26 DIAGNOSIS — H35033 Hypertensive retinopathy, bilateral: Secondary | ICD-10-CM

## 2019-06-26 DIAGNOSIS — H3581 Retinal edema: Secondary | ICD-10-CM

## 2019-06-26 DIAGNOSIS — I10 Essential (primary) hypertension: Secondary | ICD-10-CM

## 2019-06-26 DIAGNOSIS — Z961 Presence of intraocular lens: Secondary | ICD-10-CM

## 2019-06-26 DIAGNOSIS — H353221 Exudative age-related macular degeneration, left eye, with active choroidal neovascularization: Secondary | ICD-10-CM | POA: Diagnosis not present

## 2019-06-26 DIAGNOSIS — H353211 Exudative age-related macular degeneration, right eye, with active choroidal neovascularization: Secondary | ICD-10-CM

## 2019-06-26 MED ORDER — BEVACIZUMAB CHEMO INJECTION 1.25MG/0.05ML SYRINGE FOR KALEIDOSCOPE
1.2500 mg | INTRAVITREAL | Status: AC | PRN
  Administered 2019-06-26: 1.25 mg via INTRAVITREAL

## 2019-07-17 ENCOUNTER — Ambulatory Visit: Payer: Self-pay | Admitting: Family Medicine

## 2019-07-17 NOTE — Telephone Encounter (Signed)
Pt states direct exposure at church to covid positive minister. Exposure was last Tuesday. States sat at table, directly across, masked. States she has dental appt tomorrow for a consult; questioning if she should be tested. Advised to call dental practice. Also questioning if she should be tested; asymptomatic. Information provided for scheduling. Pt verbalizes understanding.  Reason for Disposition . Health Information question, no triage required and triager able to answer question  Answer Assessment - Initial Assessment Questions 1. REASON FOR CALL or QUESTION: "What is your reason for calling today?" or "How can I best help you?" or "What question do you have that I can help answer?"     Covid testing  Protocols used: INFORMATION ONLY CALL-A-AH

## 2019-07-19 ENCOUNTER — Ambulatory Visit: Payer: Medicare Other | Attending: Internal Medicine

## 2019-07-19 ENCOUNTER — Other Ambulatory Visit: Payer: Self-pay

## 2019-07-19 DIAGNOSIS — Z20822 Contact with and (suspected) exposure to covid-19: Secondary | ICD-10-CM

## 2019-07-21 LAB — NOVEL CORONAVIRUS, NAA: SARS-CoV-2, NAA: NOT DETECTED

## 2019-08-07 ENCOUNTER — Encounter (INDEPENDENT_AMBULATORY_CARE_PROVIDER_SITE_OTHER): Payer: Medicare Other | Admitting: Ophthalmology

## 2019-08-07 NOTE — Progress Notes (Signed)
[facility] - Clinic Note  08/10/2019     CHIEF COMPLAINT Patient presents for Retina Follow Up   HISTORY OF PRESENT ILLNESS: Kaitlyn Parks is a 85 y.o. female who presents to the clinic today for:   HPI    Retina Follow Up    Patient presents with  Wet AMD.  In both eyes.  This started 6 weeks ago.  Severity is moderate.  I, the attending physician,  performed the HPI with the patient and updated documentation appropriately.          Comments    Patient here for 6 weeks retina follow up for exu ARMD OU. Patient states vision about the same. No eye pain.        Last edited by Rennis Chris, MD on 08/11/2019  3:24 PM. (History)    pt states she is doing well, she feels like her left eye is better than her right eye  Referring physician: Wynn Banker, MD [address] [place],  [place] [address]  HISTORICAL INFORMATION:   Selected notes from the MEDICAL RECORD NUMBER Referred by Dr. [place] DeMarco for concern of exu ARMD LEE: 11.26.19 (J. 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 (Ophthalmic Drugs)   No current facility-administered medications for this visit. (Ophthalmic Drugs)   Current Outpatient Medications (Other)  Medication Sig  . aspirin EC 81 MG tablet Take 81 mg by mouth daily.  . Biotin w/ Vitamins C & E (HAIR/SKIN/NAILS PO) Take 1 tablet by mouth daily.  . hydrALAZINE (APRESOLINE) 10 MG tablet Take 1 tablet (10 mg total) by mouth 4 (four) times daily as needed (elevated blood pressure). For elevated BP >160  . isosorbide mononitrate (IMDUR) 60 MG 24 hr tablet TAKE 1 TABLET ONCE DAILY.  . Multiple Vitamins-Calcium (ONE-A-DAY WOMENS PO) Take 1 tablet by mouth daily.   . Multiple Vitamins-Minerals (PRESERVISION AREDS 2 PO) Take 1 capsule by mouth 2 (two) times daily.  .  nitroGLYCERIN (NITROSTAT) 0.4 MG SL tablet Place 1 tablet (0.4 mg total) under the tongue every 5 (five) minutes as needed for chest pain.  Marland Kitchen PRESCRIPTION MEDICATION Avastin eye injections given by Dr Vanessa Barbara due to macular degeneration  . rosuvastatin (CRESTOR) 10 MG tablet TAKE (1/2) TABLET DAILY.  Marland Kitchen sulfamethoxazole-trimethoprim (BACTRIM DS) 800-160 MG tablet   . SYNTHROID 50 MCG tablet TAKE 1 TABLET EACH DAY.   No current facility-administered medications for this visit. (Other)      REVIEW OF SYSTEMS: ROS    Positive for: Gastrointestinal, Genitourinary, Musculoskeletal, Cardiovascular, Eyes   Negative for: Constitutional, Neurological, Skin, HENT, Endocrine, Respiratory, Psychiatric, Allergic/Imm, Heme/Lymph   Last edited by Laddie Aquas, COA on 08/10/2019  1:37 PM. (History)       ALLERGIES Allergies  Allergen Reactions  . Acyclovir And Related Other (See Comments)    unknown  . Pravastatin Sodium Other (See Comments)    cystitis  . Zocor [Simvastatin] Other (See Comments)    cystitis    PAST MEDICAL HISTORY Past Medical History:  Diagnosis Date  . Arthritis    DDD, scoliosis, sees Dr. Lovell Sheehan for this, uses norco very rarely for pain  . Bradycardia 11/11/2017  . CAD (coronary artery disease)    LAD stenting of a 90% lesion 2012  . Cystocele   . Elevated cholesterol   . GERD (gastroesophageal  reflux disease)    dx in work up 2016 for atypical CP at OSH   pt. denies  . Heart murmur   . Hypertensive retinopathy    OU  . Hypothyroidism   . Interstitial cystitis    sees Dr. Annabell HowellsWrenn  . Macular degeneration    OU  . NSTEMI (non-ST elevated myocardial infarction) (HCC)   . Osteoporosis   . Rectocele   . 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 Scoliosis   . Thyroid disease    Hypothyroid  . Urinary incontinence    USI  . Uterine prolapse    Past Surgical History:  Procedure Laterality Date  . BLADDER SUSPENSION  2011  . CATARACT EXTRACTION Bilateral  2015   Dr. Elmer PickerHecker  . CORONARY ANGIOPLASTY WITH STENT PLACEMENT  04/21/2010   LAD 80%, RCA 30%, nl EF, s/p DES LAD  . EYE SURGERY    . LEFT HEART CATH AND CORONARY ANGIOGRAPHY N/A 06/14/2017   Procedure: LEFT HEART CATH AND CORONARY ANGIOGRAPHY;  Surgeon: Runell GessBerry, Jonathan J, MD;  Location: [facility];  Service: Cardiovascular;  Laterality: N/A;  . OOPHORECTOMY  2011   BSO  . TOTAL HIP ARTHROPLASTY Right 06/10/2017   Procedure: RIGHT TOTAL HIP ARTHROPLASTY ANTERIOR APPROACH;  Surgeon: Samson FredericSwinteck, Gizzelle Lacomb, MD;  Location: [facility];  Service: Orthopedics;  Laterality: Right;  Needs RNFA  . VAGINAL HYSTERECTOMY  2011   LAV[facility] BSO; benign    FAMILY HISTORY Family History  Problem Relation Age of Onset  . Hypertension Mother   . Heart disease Mother   . Heart disease Father   . Heart disease Sister   . Colon cancer Paternal Aunt 1875  . Breast cancer Paternal Aunt        Age 83's  . Diabetes Sister   . Endometrial cancer Sister   . Breast cancer Cousin        Maternal 1st cousins-Age 83's  . Leukemia Paternal Aunt     SOCIAL HISTORY Social History   Tobacco Use  . Smoking status: Never Smoker  . Smokeless tobacco: Never Used  Substance Use Topics  . Alcohol use: No    Comment: Rare  . Drug use: No         OPHTHALMIC EXAM:  Base Eye Exam    Visual Acuity (Snellen - Linear)      Right Left   Dist [place] 20/40 -2 20/20 -2   Dist ph [place] 20/30 -1        Tonometry (Tonopen, 1:34 PM)      Right Left   Pressure 13 18       Pupils      Dark Light Shape React APD   Right 4 3 Round Brisk None   Left 4 3 Round Brisk None       Visual Fields (Counting fingers)      Left Right    Full Full       Extraocular Movement      Right Left    Full, Ortho Full, Ortho       Neuro/Psych    Oriented x3: Yes   Mood/Affect: Normal       Dilation    Both eyes: 1.0% Mydriacyl, 2.5% Phenylephrine @ 1:34 PM        Slit Lamp and Fundus Exam    Slit Lamp Exam      Right Left    Lids/Lashes mild Telangiectasia, marginal leasion nasal UL Telangiectasia, Meibomian gland dysfunction   Conjunctiva/Sclera  White and quiet White and quiet   Cornea Arcus, 1 Punctate epithelial erosions 1-2+ Punctate epithelial erosions, Arcus   Anterior Chamber Deep and clear Deep and clear   Iris Round and dilated Round and well dilated   Lens PC IOL in good position PC IOL in excellent position   Vitreous Vitreous syneresis, Posterior vitreous detachment Vitreous syneresis, Posterior vitreous detachment, vitreous condensations       Fundus Exam      Right Left   Disc Pink and Sharp, mild PPP, peripapillary heme at 1200--persistent Pink and Sharp, mild temporal PPA   C/D Ratio 0.4 0.3   Macula Blunted foveal reflex, Drusen, RPE mottling and clumping, early Atrophy, +PEDs, stable improvement of central cyst / IRF, No heme Blunted foveal reflex, +drusen, pigment clumping, stable resolution of central edema, RPE mottling, clumping and early atrophy, no heme   Vessels Vascular attenuation Vascular attenuation   Periphery Attached, scattered reticular degeneration   Attached, scattered reticular degeneration          IMAGING AND PROCEDURES  Imaging and Procedures for @TODAY @  OCT, Retina - OU - Both Eyes       Right Eye Quality was good. Central Foveal Thickness: 259. Progression has been stable. Findings include abnormal foveal contour, retinal drusen , pigment epithelial detachment, outer retinal atrophy, intraretinal hyper-reflective material, no SRF, intraretinal fluid (Stable improvement in IRF; mild interval improvement in PED).   Left Eye Quality was good. Central Foveal Thickness: 232. Progression has been stable. Findings include retinal drusen , outer retinal atrophy, pigment epithelial detachment, intraretinal hyper-reflective material, normal foveal contour, no SRF, no IRF (Stable resolution of bullous PED and IRF/SRF -- now just central ORA).   Notes *Images captured and  stored on drive  Diagnosis / Impression:  OD: exudative ARMD -- stable improvement in IRF; mild interval improvement in PED OS: exu-ARMD -- stable resolution of bullous PED and IRF/SRF -- now just central ORA   Clinical management:  See below  Abbreviations: NFP - Normal foveal profile. CME - cystoid macular edema. PED - pigment epithelial detachment. IRF - intraretinal fluid. SRF - subretinal fluid. EZ - ellipsoid zone. ERM - epiretinal membrane. ORA - outer retinal atrophy. ORT - outer retinal tubulation. SRHM - subretinal hyper-reflective material        Intravitreal Injection, Pharmacologic Agent - OD - Right Eye       Time Out 08/10/2019. 2:41 PM. Confirmed correct patient, procedure, site, and patient consented.   Anesthesia Topical anesthesia was used. Anesthetic medications included Lidocaine 2%, Proparacaine 0.5%.   Procedure Preparation included 5% betadine to ocular surface, eyelid speculum. A 30 gauge needle was used.   Injection:  1.25 mg Bevacizumab (AVASTIN) SOLN   NDC: 10/08/2019, Lot: [id number] , Expiration date: 10/03/2019   Route: Intravitreal, Site: Right Eye, Waste: 0 mL  Post-op Post injection exam found visual acuity of at least counting fingers. The patient tolerated the procedure well. There were no complications. The patient received written and verbal post procedure care education.                 ASSESSMENT/PLAN:    ICD-10-CM   1. Exudative age-related macular degeneration of left eye with active choroidal neovascularization (HCC)  H35.3221   2. Retinal edema  H35.81 OCT, Retina - OU - Both Eyes  3. Exudative age-related macular degeneration of right eye with active choroidal neovascularization (HCC)  H35.3211 Intravitreal Injection, Pharmacologic Agent - OD - Right Eye    Bevacizumab (AVASTIN) SOLN  1.25 mg  4. Essential hypertension  I10   5. Hypertensive retinopathy of both eyes  H35.033   6. Pseudophakia of both eyes  Z96.1      1,2. Exudative age-related macular degeneration, left eye.    - OCT 4.30.2020 showed interval conversion of OD from nonexudative ARMD to exu-ARMD with large dome-shaped PED with overlying IRF/SRF--stable  - s/p IVA OS #1 (04.30.20), #2 (05.28.20), #3 (06.26.20), #4 (08.03.20), #5 (09.11.20), #6 (10.19.20), #7 (11.23.20)  - today, stable resolution of PED w/ overlying IRF/SRF -- now just ORA  - benefits investigation for Eylea initiated 4.30.2020 -- approved as of 5.28.2020  - FA 5.28.2020 -- no active CNV OS, just staining  - discussed findings and excellent response to IVA  - recommend holding off on injection OS today  - pt in agreement  - RBA of procedure discussed, questions answered  - informed consent obtained and signed  - see procedure note  - f/u in 6 wks -- DFE/OCT  3. Age related macular degeneration, exudative, OD  - interval development of IRF first noted on 09.11.20 -- conversion from nonexudative to exudative ARMD  - pt initially presented on 11.27.19 due to alert from [facility] home monitoring system for OD, but was unable to stay for fluorescein angiogram at that time  - [facility] prescribed by Dr. Elmer Picker  - FA (5.28.2020) showed staining / window defect corresponding temporal RPE changes OU -- no active CNV OU  - S/P IVA OD #1 (09.11.20), #2 (10.19.20), #3 (11.23.20)  - OCT shows stable improvement in IRF; mild interval improvement in PED  - BCVA stable at 20/30 OD  - recommend IVA OD #4 today, 01.07.20  - pt wishes to proceed  - RBA of procedure discussed, questions answered  - informed consent obtained and signed  - see procedure note  - f/u 6 weeks  4,5. Hypertensive retinopathy OU  - discussed importance of tight BP control  - monitor  6. Pseudophakia OU  - s/p CE/IOL OU  - beautiful surgeries, doing well  - monitor   Ophthalmic Meds Ordered this visit:  Meds ordered this encounter  Medications  . Bevacizumab (AVASTIN) SOLN 1.25 mg       Return  in about 6 weeks (around 09/21/2019) for f/u exu ARMD OU, DFE, OCT.  There are no Patient Instructions on file for this visit.   Explained the diagnoses, plan, and follow up with the patient and they expressed understanding.  Patient expressed understanding of the importance of proper follow up care.    This document serves as a record of services personally performed by Karie Chimera, MD, PhD. It was created on their behalf by Herby Abraham, COA, a certified ophthalmic assistant. The creation of this record is the provider's dictation and/or activities during the visit.    Electronically signed by: Herby Abraham, COA @TODAY @ 3:28 PM   This document serves as a record of services personally performed by , MD, PhD. It was created on their behalf by Karie Chimera, OA, an ophthalmic assistant. The creation of this record is the provider's dictation and/or activities during the visit.    Electronically signed by: Laurian Brim, OA 01.07.2021 3:28 PM   03.07.2021, M.D., Ph.D. Diseases & Surgery of the Retina and Vitreous [facility]  I have reviewed the above documentation for accuracy and completeness, and I agree with the above. [facility], M.D., Ph.D. 08/11/19 3:28 PM    Abbreviations:  M myopia (nearsighted); A astigmatism; H hyperopia (farsighted); P presbyopia; Mrx spectacle prescription;  CTL contact lenses; OD right eye; OS left eye; OU both eyes  XT exotropia; ET esotropia; PEK punctate epithelial keratitis; PEE punctate epithelial erosions; DES dry eye syndrome; MGD meibomian gland dysfunction; ATs artificial tears; PFAT's preservative free artificial tears; [place] nuclear sclerotic cataract; PSC posterior subcapsular cataract; ERM epi-retinal membrane; PVD posterior vitreous detachment; RD retinal detachment; DM diabetes mellitus; DR diabetic retinopathy; NPDR non-proliferative diabetic retinopathy; PDR proliferative diabetic retinopathy;  CSME clinically significant macular edema; DME diabetic macular edema; dbh dot blot hemorrhages; CWS cotton wool spot; POAG primary open angle glaucoma; C/D cup-to-disc ratio; HVF humphrey visual field; GVF goldmann visual field; OCT optical coherence tomography; IOP intraocular pressure; BRVO Branch retinal vein occlusion; CRVO central retinal vein occlusion; CRAO central retinal artery occlusion; BRAO branch retinal artery occlusion; RT retinal tear; SB scleral buckle; PPV pars plana vitrectomy; VH Vitreous hemorrhage; PRP panretinal laser photocoagulation; IVK intravitreal kenalog; VMT vitreomacular traction; MH Macular hole;  NVD neovascularization of the disc; NVE neovascularization elsewhere; AREDS age related eye disease study; ARMD age related macular degeneration; POAG primary open angle glaucoma; EBMD epithelial/anterior basement membrane dystrophy; ACIOL anterior chamber intraocular lens; IOL intraocular lens; PCIOL posterior chamber intraocular lens; Phaco/IOL phacoemulsification with intraocular lens placement; [place] photorefractive keratectomy; LASIK laser assisted in situ keratomileusis; HTN hypertension; DM diabetes mellitus; COPD chronic obstructive pulmonary disease

## 2019-08-10 ENCOUNTER — Encounter (INDEPENDENT_AMBULATORY_CARE_PROVIDER_SITE_OTHER): Payer: Self-pay | Admitting: Ophthalmology

## 2019-08-10 ENCOUNTER — Ambulatory Visit (INDEPENDENT_AMBULATORY_CARE_PROVIDER_SITE_OTHER): Payer: Medicare Other | Admitting: Ophthalmology

## 2019-08-10 DIAGNOSIS — H3581 Retinal edema: Secondary | ICD-10-CM

## 2019-08-10 DIAGNOSIS — H353221 Exudative age-related macular degeneration, left eye, with active choroidal neovascularization: Secondary | ICD-10-CM

## 2019-08-10 DIAGNOSIS — I10 Essential (primary) hypertension: Secondary | ICD-10-CM

## 2019-08-10 DIAGNOSIS — H35033 Hypertensive retinopathy, bilateral: Secondary | ICD-10-CM

## 2019-08-10 DIAGNOSIS — Z961 Presence of intraocular lens: Secondary | ICD-10-CM

## 2019-08-10 DIAGNOSIS — H353211 Exudative age-related macular degeneration, right eye, with active choroidal neovascularization: Secondary | ICD-10-CM

## 2019-08-11 MED ORDER — BEVACIZUMAB CHEMO INJECTION 1.25MG/0.05ML SYRINGE FOR KALEIDOSCOPE
1.2500 mg | INTRAVITREAL | Status: AC | PRN
  Administered 2019-08-11: 1.25 mg via INTRAVITREAL

## 2019-08-13 ENCOUNTER — Ambulatory Visit: Payer: Medicare Other | Attending: Internal Medicine

## 2019-08-13 ENCOUNTER — Other Ambulatory Visit: Payer: Self-pay

## 2019-08-13 DIAGNOSIS — Z23 Encounter for immunization: Secondary | ICD-10-CM | POA: Insufficient documentation

## 2019-08-13 NOTE — Progress Notes (Signed)
   Covid-19 Vaccination Clinic  Name:  CORYNNE SCIBILIA    MRN: [id number] DOB: 1934-12-09  08/13/2019  Ms. Utley was observed post Covid-19 immunization for 15 minutes without incidence. She was provided with Vaccine Information Sheet and instruction to access the V-Safe system.   Ms. Onstad was instructed to call 911 with any severe reactions post vaccine: Marland Kitchen Difficulty breathing  . Swelling of your face and throat  . A fast heartbeat  . A bad rash all over your body  . Dizziness and weakness    Immunizations Administered    Name Date Dose VIS Date Route   Pfizer COVID-19 Vaccine 08/13/2019 11:21 AM 0.3 mL 07/14/2019 Intramuscular   Manufacturer: [employer], [employer]   Lot: [id number]   NDC: [address]-3

## 2019-08-30 ENCOUNTER — Other Ambulatory Visit: Payer: Self-pay | Admitting: Cardiology

## 2019-09-02 ENCOUNTER — Ambulatory Visit: Payer: Medicare Other | Attending: Internal Medicine

## 2019-09-02 DIAGNOSIS — Z23 Encounter for immunization: Secondary | ICD-10-CM | POA: Insufficient documentation

## 2019-09-02 NOTE — Progress Notes (Signed)
   Covid-19 Vaccination Clinic  Name:  Kaitlyn Parks    MRN: [id number] DOB: [birth year]  09/02/2019  [name] was observed post Covid-19 immunization for 15 minutes without incidence. She was provided with Vaccine Information Sheet and instruction to access the V-Safe system.   [name] was instructed to call 911 with any severe reactions post vaccine: Marland Kitchen Difficulty breathing  . Swelling of your face and throat  . A fast heartbeat  . A bad rash all over your body  . Dizziness and weakness    Immunizations Administered    Name Date Dose VIS Date Route   Pfizer COVID-19 Vaccine 09/02/2019 10:48 AM 0.3 mL 07/14/2019 Intramuscular   Manufacturer: [employer], [employer]   Lot: [id number]   NDC: [address]-9

## 2019-09-05 ENCOUNTER — Other Ambulatory Visit: Payer: Self-pay

## 2019-09-05 ENCOUNTER — Encounter: Payer: Self-pay | Admitting: Cardiology

## 2019-09-05 ENCOUNTER — Ambulatory Visit: Payer: Medicare Other | Admitting: Cardiology

## 2019-09-05 ENCOUNTER — Telehealth: Payer: Self-pay | Admitting: Cardiology

## 2019-09-05 VITALS — Ht 63.0 in | Wt 129.0 lb

## 2019-09-05 DIAGNOSIS — I2510 Atherosclerotic heart disease of native coronary artery without angina pectoris: Secondary | ICD-10-CM | POA: Diagnosis not present

## 2019-09-05 DIAGNOSIS — I10 Essential (primary) hypertension: Secondary | ICD-10-CM

## 2019-09-05 NOTE — Telephone Encounter (Signed)
New Message  Pt c/o medication issue:  1. Name of Medication: isosorbide mononitrate (IMDUR) 60 MG 24 hr tablet; rosuvastatin (CRESTOR) 10 MG tablet  2. How are you currently taking this medication (dosage and times per day)? As written  3. Are you having a reaction (difficulty breathing--STAT)? No   4. What is your medication issue? Not sure which medication, but it makes her really dizzy. Started last week  Please call to discuss

## 2019-09-05 NOTE — Progress Notes (Signed)
Cardiology Office Note   Date:  09/05/2019   ID:  Kaitlyn Parks, Kaitlyn Parks 1934-08-21, MRN [id number]  PCP:  Wynn Banker, MD  Cardiologist:   Rollene Rotunda, MD   Chief Complaint  Patient presents with  . Dizziness      History of Present Illness: Kaitlyn Parks is a 85 y.o. female who presents for followup up of CAD.  She had a PCI of LAD in 2012. She recently underwent total right hip arthroplasty. She presented to the hospital on 06/12/2017 with sudden onset of right-sided neck pain with radiation to the right jaw. CTA of the chest was negative for PE however troponin was elevated at 0.42. Echocardiogram was obtained on 06/13/2017 showed EF 60-65%, mild LVH, grade 1 DD, mild MR, mild TR, peak PA pressure 30 mmHg. Cardiac catheterization performed on 06/14/2017 showed 95% ostial D1 lesion, widely patent ostial LAD stent, 30% proximal LAD stenosis. Medical therapy was recommended. Imdur 60 mg daily was added to her medical regimen. Beta blocker was initially increased and later reduced due to symptomatic bradycardia. At a previous visit I stopped the beta blocker.   She called today because of increased dizziness sporadically when her blood pressure is up.  She is noticing her blood pressure is quite labile and going up and she is having take a few hydralazine perhaps for the last 7 days.  She says this is unusual and she has not taken this many in the last several months.  She has shown me some blood pressure readings  Review the diary and she is often having systolics in the 160s and sometimes 170s.  Was one reading where her blood pressure diastolic was in the 120s but this is unusual.  That is typically below 90.  She feels like some of her symptoms related to pain and she is having back pain.  She is had to rarely take some Aleve.  She does have a pain clinic appointment scheduled.  She is seen her neurosurgeon and is resisting having some back injections.  She says she also feels  like not exercising as exacerbated her symptoms.  She is not having any new shortness of breath, PND or orthopnea.  She is not having any new chest pressure, neck or arm discomfort.  She had no weight gain or edema.      Past Medical History:  Diagnosis Date  . Arthritis    DDD, scoliosis, sees Dr. Lovell Sheehan for this, uses norco very rarely for pain  . Bradycardia 11/11/2017  . CAD (coronary artery disease)    LAD stenting of a 90% lesion 2012  . Cystocele   . Elevated cholesterol   . GERD (gastroesophageal reflux disease)    dx in work up 2016 for atypical CP at OSH   pt. denies  . Heart murmur   . Hypertensive retinopathy    OU  . Hypothyroidism   . Interstitial cystitis    sees Dr. [name]  . Macular degeneration    OU  . NSTEMI (non-ST elevated myocardial infarction) (HCC)   . Osteoporosis   . Rectocele   . 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 Scoliosis   . Thyroid disease    Hypothyroid  . Urinary incontinence    USI  . Uterine prolapse     Past Surgical History:  Procedure Laterality Date  . BLADDER SUSPENSION  2011  . CATARACT EXTRACTION Bilateral 2015   Dr. Elmer Picker  . CORONARY ANGIOPLASTY WITH STENT PLACEMENT  04/21/2010   LAD 80%, RCA 30%, nl EF, s/p DES LAD  . EYE SURGERY    . LEFT HEART CATH AND CORONARY ANGIOGRAPHY N/A 06/14/2017   Procedure: LEFT HEART CATH AND CORONARY ANGIOGRAPHY;  Surgeon: Runell Gess, MD;  Location: [facility];  Service: Cardiovascular;  Laterality: N/A;  . OOPHORECTOMY  2011   BSO  . TOTAL HIP ARTHROPLASTY Right 06/10/2017   Procedure: RIGHT TOTAL HIP ARTHROPLASTY ANTERIOR APPROACH;  Surgeon: Samson Frederic, MD;  Location: [facility];  Service: Orthopedics;  Laterality: Right;  Needs RNFA  . VAGINAL HYSTERECTOMY  2011   [facility] BSO; benign     Current Outpatient Medications  Medication Sig Dispense Refill  . aspirin EC 81 MG tablet Take 81 mg by mouth daily.    . Biotin w/ Vitamins C & E (HAIR/SKIN/NAILS PO) Take 1  tablet by mouth daily.    . hydrALAZINE (APRESOLINE) 10 MG tablet TAKE (1) TABLET TWICE A DAY AS NEEDED. 60 tablet 9  . isosorbide mononitrate (IMDUR) 60 MG 24 hr tablet TAKE 1 TABLET ONCE DAILY. 30 tablet 11  . Multiple Vitamins-Calcium (ONE-A-DAY WOMENS PO) Take 1 tablet by mouth daily.     . Multiple Vitamins-Minerals (PRESERVISION AREDS 2 PO) Take 1 capsule by mouth 2 (two) times daily.    . nitroGLYCERIN (NITROSTAT) 0.4 MG SL tablet Place 1 tablet (0.4 mg total) under the tongue every 5 (five) minutes as needed for chest pain. 25 tablet 8  . PRESCRIPTION MEDICATION Avastin eye injections given by Dr Vanessa Barbara due to macular degeneration    . rosuvastatin (CRESTOR) 10 MG tablet TAKE (1/2) TABLET DAILY. 60 tablet 0  . sulfamethoxazole-trimethoprim (BACTRIM DS) 800-160 MG tablet     . SYNTHROID 50 MCG tablet TAKE 1 TABLET EACH DAY. 90 tablet 1   No current facility-administered medications for this visit.    Allergies:   Acyclovir and related, Pravastatin sodium, and Zocor [simvastatin]     ROS:  Please see the history of present illness.   Otherwise, review of systems are positive for none.   All other systems are reviewed and negative.    PHYSICAL EXAM: VS:  Ht 5\' 3"  (1.6 m)   Wt 129 lb (58.5 kg)   BMI 22.85 kg/m  , BMI Body mass index is 22.85 kg/m. GENERAL:  Well appearing HEENT:  Pupils equal round and reactive, fundi not visualized, oral mucosa unremarkable NECK:  No jugular venous distention, waveform within normal limits, carotid upstroke brisk and symmetric, no bruits, no thyromegaly LYMPHATICS:  No cervical, inguinal adenopathy LUNGS:  Clear to auscultation bilaterally BACK:  No CVA tenderness CHEST:  Unremarkable HEART:  PMI not displaced or sustained,S1 and S2 within normal limits, no S3, no S4, no clicks, no rubs, no murmurs ABD:  Flat, positive bowel sounds normal in frequency in pitch, no bruits, no rebound, no guarding, no midline pulsatile mass, no hepatomegaly, no  splenomegaly EXT:  2 plus pulses throughout, no edema, no cyanosis no clubbing SKIN:  No rashes no nodules NEURO:  Cranial nerves II through XII grossly intact, motor grossly intact throughout PSYCH:  Cognitively intact, oriented to person place and time    EKG:  EKG is ordered today. The ekg ordered today demonstrates 60, axis within normal limits, intervals within normal limits, no acute ST-T wave changes.   Recent Labs: 03/31/2019: ALT 11; BUN 21; Creatinine, Ser 0.79; Hemoglobin 13.8; Platelets 248.0; Potassium 3.8; Sodium 142; TSH 2.38    Lipid Panel  Component Value Date/Time   CHOL 154 03/31/2019 0915   CHOL 160 02/05/2017 0834   TRIG 89.0 03/31/2019 0915   HDL 57.50 03/31/2019 0915   HDL 52 02/05/2017 0834   CHOLHDL 3 03/31/2019 0915   VLDL 17.8 03/31/2019 0915   LDLCALC 79 03/31/2019 0915   LDLCALC 77 02/05/2017 0834   LDLDIRECT 140.6 05/25/2012 0855      Wt Readings from Last 3 Encounters:  09/05/19 129 lb (58.5 kg)  05/10/19 134 lb (60.8 kg)  03/28/19 128 lb 6.4 oz (58.2 kg)      Other studies Reviewed: Additional studies/ records that were reviewed today include: None. Review of the above records demonstrates:  Please see elsewhere in the note.     ASSESSMENT AND PLAN:   CAD, NATIVE VESSEL -  The patient has no new sypmtoms.  No further cardiovascular testing is indicated.  We will continue with aggressive risk reduction and meds as listed.   HYPERTENSION -  She called today to discuss this.  Her blood pressures are somewhat labile on she was slightly orthostatic in the office.  She has some PACs and I hooked her back up to the monitor in a couple of nonconducted PACs that she does not feel.  She is going to keep a blood pressure diary which will be helpful to see if I can manipulate her meds.  She has been somewhat sensitive and particularly with low heart rates so I have to be careful as I just medications.  I think pain management will also be  important.   DIZZINESS - This seems to be much more related to blood pressure than anything else.  She does have some mild drop in her orthostatic blood pressures.  I will adjust meds carefully as above.  I do note that she has some PACs but these are not particularly symptomatic.   Current medicines are reviewed at length with the patient today.  The patient does not have concerns regarding medicines.  The following changes have been made:  no change  Labs/ tests ordered today include: none  Orders Placed This Encounter  Procedures  . EKG 12-Lead     Disposition:   FU with me in 3 months.     Signed, Minus Breeding, MD  09/05/2019 5:09 PM    [facility]

## 2019-09-05 NOTE — Patient Instructions (Signed)
Medication Instructions:  No Changes *If you need a refill on your cardiac medications before your next appointment, please call your pharmacy*  Lab Work: None  Testing/Procedures: None  Follow-Up: At [facility], you and your health needs are our priority.  As part of our continuing mission to provide you with exceptional heart care, we have created designated Provider Care Teams.  These Care Teams include your primary Cardiologist (physician) and Advanced Practice Providers (APPs -  Physician Assistants and Nurse Practitioners) who all work together to provide you with the care you need, when you need it.  Your next appointment:   3 month(s)  The format for your next appointment:   In Person  Provider:   Rollene Rotunda, MD  Other Instructions Send blood pressure readings through MyChart

## 2019-09-05 NOTE — Telephone Encounter (Signed)
Spoke with pt who states her BP has been elevated recently and she believes this is because she is not as active as she used to be. She confirms she is on Imdur as prescribed and states she has been taking her hydralazine for SBP above 160. She reports BP readings as follows:   -Fri 1/29 161/93. Pt states she took her hydralazine after this reading -Sat 1/30 123/69 -Sun 1/31 173/91 evening. Pt states she took her hydralazine after this reading -Sun 1/31 110/69 after taking hydralazine -Mon 2/1 151/90 during the day -Mon 2/1 142/58 evening   Pt states she has been having dizziness after taking her hydralazine and had read the bottles of her medications and noted that Imdur, hydralazine, and Crestor may cause dizziness. Pt states she has been taking her Imdur for around 2 yrs with no c/o dizziness. Informed pt that it is possible that hydralazine causing her symptoms. Pt states she has not taken her hydralazine since Sunday and has not had any dizziness. Pt would like an appt with APP or Dr. Antoine Poche to discuss. Unable to find APP appt this week, but informed pt that Dr. Antoine Poche has available appt at 4:00 pm today. Pt would like to be scheduled for appt

## 2019-09-06 ENCOUNTER — Ambulatory Visit (INDEPENDENT_AMBULATORY_CARE_PROVIDER_SITE_OTHER): Payer: Medicare Other | Admitting: Family Medicine

## 2019-09-06 ENCOUNTER — Encounter: Payer: Self-pay | Admitting: Family Medicine

## 2019-09-06 ENCOUNTER — Telehealth: Payer: Self-pay | Admitting: *Deleted

## 2019-09-06 DIAGNOSIS — M545 Low back pain, unspecified: Secondary | ICD-10-CM

## 2019-09-06 DIAGNOSIS — R159 Full incontinence of feces: Secondary | ICD-10-CM | POA: Diagnosis not present

## 2019-09-06 MED ORDER — LIDOCAINE 5 % EX OINT: TOPICAL_OINTMENT | CUTANEOUS | 2 refills | Status: DC

## 2019-09-06 NOTE — Telephone Encounter (Signed)
-----   Message from Wynn Banker, MD sent at 09/06/2019  2:08 PM EST ----- Please set up lab visit followed by [facility] in 6 months

## 2019-09-06 NOTE — Progress Notes (Signed)
Virtual Visit via Telephone Note  I connected with Kaitlyn Parks on 09/06/19 at  1:30 PM EST by telephone and verified that I am speaking with the correct person using two identifiers.   I discussed the limitations, risks, security and privacy concerns of performing an evaluation and management service by telephone and the availability of in person appointments. I also discussed with the patient that there may be a patient responsible charge related to this service. The patient expressed understanding and agreed to proceed.  Location patient: home Location provider: work office Participants present for the call: patient, provider Patient did not have a visit in the prior 7 days to address this/these issue(s).   History of Present Illness: Has had issues for years. Scoliosis runs in family. Feels that due to age, arthritis. Following with [name] neuro (Dr. Lovell Sheehan) and had MRI/xray. Was told surgery wouldn't help. Referred to pain clinic. Does affect her way of life. Can't walk in neighborhood because of pain between L3-4 and wraps around to knee. Uses large ice packs on knee and hip; also sometimes takes aleve. Pain radiates down to left knee.  In past has done water exercise. Through [facility] therapy through water before COVID shut things down. Would like referral for water therapy.   Followed with Dr. [name] previously after right hip replacement. Now more pain on left side. Did physical therapy after this; but then was told that pain was coming from back and sent back to Dr. Lovell Sheehan.   Not sleeping well; waking every few hours with pain. Wakes to ice self at night.   Thinks she has some bowel prolapse. No longer has gynecologist. Hasn't had pelvic exam in probably 10 years. Some leaking stool. Had bladder screen with Dr. [name] when she had hysterectomy. Also did pelvic floor therapy with them in the past. Has noticed this for a couple of years. Does drink lactaid milk. Tries to be  careful with what she eats as well. Usually something that she eats that triggers it (like cream); can do yogurt. No caffeine, no tea. Just drinks water and lactaid milk. She could have bowel leak with sweet potatoes. Also has to avoid souffle. Limits salt intake as well. Eats very healthy overall. Creamed potatoes set her off last time.    Observations/Objective: Patient sounds cheerful and well on the phone. I do not appreciate any SOB. Speech and thought processing are grossly intact. Patient reported vitals:none reported  Assessment and Plan:  1. Low back pain without sciatica, unspecified back pain laterality, unspecified chronicity Will try topical cream; prefers to avoid medications if possible.   - Ambulatory referral to Physical Therapy - lidocaine (XYLOCAINE) 5 % ointment; Apply sparingly up to TID for pain  Dispense: 50 g; Refill: 2  2. Stool incontinence: monitor triggers and keep diary. Let me know if worsening. Sounds like every episode is triggered by dairy/soft foods. She is healthy and consistent eater. Has daily BM with good emptying. Encouraged her to also discuss pelvic floor exercises at upcoming PT.    Follow Up Instructions:  Return in about 6 months (around 03/05/2020) for Chronic condition visit; bloodwork first.   [id number] [id number] [id number] 11-20 [id number] 21-30 I did not refer this patient for an OV in the next 24 hours for this/these issue(s).  I discussed the assessment and treatment plan with the patient. The patient was provided an opportunity to ask questions and all were answered. The patient agreed with the plan and demonstrated an understanding of  the instructions.   The patient was advised to call back or seek an in-person evaluation if the symptoms worsen or if the condition fails to improve as anticipated.  I provided 30 minutes of non-face-to-face time during this encounter.   Kaitlyn Rough, MD

## 2019-09-06 NOTE — Telephone Encounter (Signed)
Spoke with the pt and scheduled appts as below.  

## 2019-09-13 ENCOUNTER — Telehealth: Payer: Self-pay

## 2019-09-13 MED ORDER — HYDRALAZINE HCL 10 MG PO TABS: 10.0000 mg | ORAL_TABLET | Freq: Two times a day (BID) | ORAL | 3 refills | Status: DC

## 2019-09-13 NOTE — Telephone Encounter (Signed)
In response to patient's MyChart message Dr. Antoine Poche wants to schedule 10mg  of Hydralazine twice a day. Patient contacted and verbalized understanding. Prescription not sent to pharmacy as patient has medication on hand with refills available.

## 2019-09-15 ENCOUNTER — Telehealth: Payer: Self-pay

## 2019-09-15 NOTE — Telephone Encounter (Signed)
Cologuard recall letter has been mailed to patients home address on file.  

## 2019-09-19 NOTE — Progress Notes (Signed)
[facility] Note  09/22/2019     CHIEF COMPLAINT Patient presents for Retina Follow Up   HISTORY OF PRESENT ILLNESS: Kaitlyn Parks is a 85 y.o. female who presents to the clinic today for:   HPI    Retina Follow Up    Patient presents with  Wet AMD.  In both eyes.  Severity is moderate.  Duration of 6 weeks.  Since onset it is stable.  I, the attending physician,  performed the HPI with the patient and updated documentation appropriately.          Comments    Patient states vision hasn't changed since last visit.        Last edited by Bernarda Caffey, MD on 09/23/2019  2:17 AM. (History)    pt states she is doing well, she feels like her left eye is better than her right eye  Referring physician: Caren Macadam, MD [place],  [place] [id number]  HISTORICAL INFORMATION:   Selected notes from the [facility] Referred by Dr. Martinique DeMarco for concern of exu ARMD LEE: 11.26.19 (J. 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 (Ophthalmic Drugs)   No current facility-administered medications for this visit. (Ophthalmic Drugs)   Current Outpatient Medications (Other)  Medication Sig  . aspirin EC 81 MG tablet Take 81 mg by mouth daily.  . Biotin w/ Vitamins C & E (HAIR/SKIN/NAILS PO) Take 1 tablet by mouth daily.  . hydrALAZINE (APRESOLINE) 10 MG tablet Take 1 tablet (10 mg total) by mouth 2 (two) times daily.  . hydrALAZINE (APRESOLINE) 25 MG tablet Take 25 mg by mouth in the morning and at bedtime.  . isosorbide mononitrate (IMDUR) 60 MG 24 hr tablet TAKE 1 TABLET ONCE DAILY.  Marland Kitchen lidocaine (XYLOCAINE) 5 % ointment Apply sparingly up to TID for pain  . Multiple Vitamins-Calcium (ONE-A-DAY WOMENS PO) Take 1 tablet by mouth daily.   . Multiple  Vitamins-Minerals (PRESERVISION AREDS 2 PO) Take 1 capsule by mouth 2 (two) times daily.  . nitroGLYCERIN (NITROSTAT) 0.4 MG SL tablet Place 1 tablet (0.4 mg total) under the tongue every 5 (five) minutes as needed for chest pain.  Marland Kitchen PRESCRIPTION MEDICATION Avastin eye injections given by Dr Coralyn Pear due to macular degeneration  . rosuvastatin (CRESTOR) 10 MG tablet TAKE (1/2) TABLET DAILY.  . SYNTHROID 50 MCG tablet TAKE 1 TABLET EACH DAY.   No current facility-administered medications for this visit. (Other)      REVIEW OF SYSTEMS: ROS    Positive for: Gastrointestinal, Genitourinary, Musculoskeletal, Cardiovascular, Eyes   Negative for: Constitutional, Neurological, Skin, HENT, Endocrine, Respiratory, Psychiatric, Allergic/Imm, Heme/Lymph   Last edited by Roselee Nova D, COT on 09/22/2019  1:30 PM. (History)       ALLERGIES Allergies  Allergen Reactions  . Acyclovir And Related Other (See Comments)    unknown  . Pravastatin Sodium Other (See Comments)    cystitis  . Zocor [Simvastatin] Other (See Comments)    cystitis    PAST MEDICAL HISTORY Past Medical History:  Diagnosis Date  . Arthritis    DDD, scoliosis, sees Dr. Arnoldo Morale for this, uses norco very rarely for pain  . Bradycardia 11/11/2017  . CAD (coronary artery disease)    LAD stenting of a 90% lesion 2012  . Cystocele   . Elevated  cholesterol   . GERD (gastroesophageal reflux disease)    dx in work up 2016 for atypical CP at OSH   pt. denies  . Heart murmur   . Hypertensive retinopathy    OU  . Hypothyroidism   . Interstitial cystitis    sees Dr. [name]  . Macular degeneration    OU  . NSTEMI (non-ST elevated myocardial infarction) (HCC)   . Osteoporosis   . Rectocele   . 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 Scoliosis   . Thyroid disease    Hypothyroid  . Urinary incontinence    USI  . Uterine prolapse    Past Surgical History:  Procedure Laterality Date  . BLADDER SUSPENSION  2011  .  CATARACT EXTRACTION Bilateral 2015   Dr. Elmer Picker  . CORONARY ANGIOPLASTY WITH STENT PLACEMENT  04/21/2010   LAD 80%, RCA 30%, nl EF, s/p DES LAD  . EYE SURGERY    . LEFT HEART CATH AND CORONARY ANGIOGRAPHY N/A 06/14/2017   Procedure: LEFT HEART CATH AND CORONARY ANGIOGRAPHY;  Surgeon: Runell Gess, MD;  Location: [facility];  Service: Cardiovascular;  Laterality: N/A;  . OOPHORECTOMY  2011   BSO  . TOTAL HIP ARTHROPLASTY Right 06/10/2017   Procedure: RIGHT TOTAL HIP ARTHROPLASTY ANTERIOR APPROACH;  Surgeon: Samson Frederic, MD;  Location: [facility];  Service: Orthopedics;  Laterality: Right;  Needs RNFA  . VAGINAL HYSTERECTOMY  2011   LAVH BSO; benign    FAMILY HISTORY Family History  Problem Relation Age of Onset  . Hypertension Mother   . Heart disease Mother   . Heart disease Father   . Heart disease Sister   . Colon cancer Paternal Aunt 64  . Breast cancer Paternal Aunt        Age 28's  . Diabetes Sister   . Endometrial cancer Sister   . Breast cancer Cousin        Maternal 1st cousins-Age 31's  . Leukemia Paternal Aunt     SOCIAL HISTORY Social History   Tobacco Use  . Smoking status: Never Smoker  . Smokeless tobacco: Never Used  Substance Use Topics  . Alcohol use: No    Comment: Rare  . Drug use: No         OPHTHALMIC EXAM:  Base Eye Exam    Visual Acuity (Snellen - Linear)      Right Left   Dist [place] 20/40 -1 20/25   Dist ph  20/25 20/20 -2       Tonometry (Tonopen, 1:45 PM)      Right Left   Pressure 14 15       Pupils      Dark Light Shape React APD   Right 4 3 Round Brisk None   Left 4 3 Round Brisk None       Visual Fields (Counting fingers)      Left Right    Full Full       Extraocular Movement      Right Left    Full, Ortho Full, Ortho       Neuro/Psych    Oriented x3: Yes   Mood/Affect: Normal       Dilation    Both eyes: 1.0% Mydriacyl, 2.5% Phenylephrine @ 1:45 PM        Slit Lamp and Fundus Exam     Slit Lamp Exam      Right Left   Lids/Lashes mild Telangiectasia, marginal leasion nasal UL Telangiectasia, Meibomian  gland dysfunction   Conjunctiva/Sclera White and quiet White and quiet   Cornea Arcus, 1 Punctate epithelial erosions 1-2+ Punctate epithelial erosions, Arcus   Anterior Chamber Deep and clear Deep and clear   Iris Round and dilated Round and well dilated   Lens PC IOL in good position PC IOL in excellent position   Vitreous Vitreous syneresis, Posterior vitreous detachment Vitreous syneresis, Posterior vitreous detachment, vitreous condensations       Fundus Exam      Right Left   Disc Pink and Sharp, mild PPP, peripapillary heme at 1200--resolved Pink and Sharp, mild temporal PPA   C/D Ratio 0.4 0.3   Macula Blunted foveal reflex, Drusen, RPE mottling and clumping, early Atrophy, +PEDs--improved, stable improvement of central cyst / IRF, No heme Blunted foveal reflex, +drusen, pigment clumping, stable resolution of central edema, RPE mottling, clumping and early atrophy, no heme   Vessels Vascular attenuation Vascular attenuation   Periphery Attached, scattered reticular degeneration   Attached, scattered reticular degeneration          IMAGING AND PROCEDURES  Imaging and Procedures for @TODAY @  OCT, Retina - OU - Both Eyes       Right Eye Quality was good. Central Foveal Thickness: 234. Progression has improved. Findings include abnormal foveal contour, retinal drusen , pigment epithelial detachment, outer retinal atrophy, intraretinal hyper-reflective material, no SRF, no IRF (interval improvement in PED -- flattening).   Left Eye Quality was good. Central Foveal Thickness: 242. Progression has been stable. Findings include retinal drusen , outer retinal atrophy, pigment epithelial detachment, intraretinal hyper-reflective material, normal foveal contour, no SRF, no IRF (Stable resolution of bullous PED and IRF/SRF -- now just central ORA).   Notes *Images  captured and stored on drive  Diagnosis / Impression:  OD: exudative ARMD -- interval improvement in PED -- flattening OS: exu-ARMD -- stable resolution of bullous PED and IRF/SRF -- now just central ORA   Clinical management:  See below  Abbreviations: NFP - Normal foveal profile. CME - cystoid macular edema. PED - pigment epithelial detachment. IRF - intraretinal fluid. SRF - subretinal fluid. EZ - ellipsoid zone. ERM - epiretinal membrane. ORA - outer retinal atrophy. ORT - outer retinal tubulation. SRHM - subretinal hyper-reflective material        Intravitreal Injection, Pharmacologic Agent - OD - Right Eye       Time Out 09/22/2019. 1:24 PM. Confirmed correct patient, procedure, site, and patient consented.   Anesthesia Topical anesthesia was used. Anesthetic medications included Lidocaine 2%, Proparacaine 0.5%.   Procedure Preparation included 5% betadine to ocular surface, eyelid speculum. A supplied needle was used.   Injection:  1.25 mg Bevacizumab (AVASTIN) SOLN   NDC: 09/24/2019, Lot: [id number] , Expiration date: 10/18/2019   Route: Intravitreal, Site: Right Eye, Waste: 0 mL  Post-op Post injection exam found visual acuity of at least counting fingers. The patient tolerated the procedure well. There were no complications. The patient received written and verbal post procedure care education.                 ASSESSMENT/PLAN:    ICD-10-CM   1. Exudative age-related macular degeneration of left eye with active choroidal neovascularization (HCC)  H35.3221   2. Retinal edema  H35.81 OCT, Retina - OU - Both Eyes  3. Exudative age-related macular degeneration of right eye with active choroidal neovascularization (HCC)  H35.3211 Intravitreal Injection, Pharmacologic Agent - OD - Right Eye    Bevacizumab (AVASTIN) SOLN 1.25 mg  4. Essential hypertension  I10   5. Hypertensive retinopathy of both eyes  H35.033   6. Pseudophakia of both eyes  Z96.1   7.  Intermediate stage nonexudative age-related macular degeneration of right eye  H35.3112   8. Intermediate stage nonexudative age-related macular degeneration of both eyes  H35.3132     1,2. Exudative age-related macular degeneration, left eye.    - OCT 4.30.2020 showed interval conversion of OS from nonexudative ARMD to exu-ARMD with large dome-shaped PED with overlying IRF/SRF--stably improved  - s/p IVA OS #1 (04.30.20), #2 (05.28.20), #3 (06.26.20), #4 (08.03.20), #5 (09.11.20), #6 (10.19.20), #7 (11.23.20)  - today, stable resolution of PED w/ overlying IRF/SRF -- now just ORA  - BCVA 20/20-2, stable from prior  - benefits investigation for Eylea initiated 4.30.2020 -- approved for 2021  - FA 5.2-8.2020 -- no active CNV OS, just staining  - discussed findings and excellent response to IVA  - recommend holding off on injection OS today - pt in agreement  - f/u in 6 wks -- DFE/OCT  3. Age related macular degeneration, exudative, OD  - interval development of IRF first noted on 09.11.20 -- conversion from nonexudative to exudative ARMD  - pt initially presented on 11.27.19 due to alert from [facility] home monitoring system for OD  - [facility] prescribed by Dr. Elmer Picker  - FA (5.28.2020) showed staining / window defect corresponding temporal RPE changes OU -- no active CNV OU  - S/P IVA OD #1 (09.11.20), #2 (10.19.20), #3 (11.23.20), #4 (01.07.21)  - benefits investigation for Eylea initiated 4.30.2020 -- approved for 2021  - OCT shows stable improvement in IRF; mild interval improvement in PED with flattening  - BCVA stable at 20/25 OD, improved from 20/30  - recommend IVA OD #5 today, 02.19.21  - pt wishes to proceed  - RBA of procedure discussed, questions answered  - informed consent obtained  - see procedure note  - f/u 6 weeks -- DFE/OCT; possible injection  4,5. Hypertensive retinopathy OU  - discussed importance of tight BP control  - monitor  6. Pseudophakia OU  - s/p CE/IOL  OU  - beautiful surgeries, doing well  - monitor   Ophthalmic Meds Ordered this visit:  Meds ordered this encounter  Medications  . Bevacizumab (AVASTIN) SOLN 1.25 mg       Return in about 6 weeks (around 11/03/2019) for f/u exu ARMD OD, DFE, OCT.  There are no Patient Instructions on file for this visit.   Explained the diagnoses, plan, and follow up with the patient and they expressed understanding.  Patient expressed understanding of the importance of proper follow up care.    This document serves as a record of services personally performed by Karie Chimera, MD, PhD. It was created on their behalf by Annalee Genta, COMT. The creation of this record is the provider's dictation and/or activities during the visit.  Electronically signed by: Annalee Genta, COMT 09/23/19 2:48 AM   Karie Chimera, M.D., Ph.D. Diseases & Surgery of the Retina and Vitreous [facility]  I have reviewed the above documentation for accuracy and completeness, and I agree with the above. Karie Chimera, M.D., Ph.D. 09/23/19 2:48 AM    Abbreviations: M myopia (nearsighted); A astigmatism; H hyperopia (farsighted); P presbyopia; Mrx spectacle prescription;  CTL contact lenses; OD right eye; OS left eye; OU both eyes  XT exotropia; ET esotropia; PEK punctate epithelial keratitis; PEE punctate epithelial erosions; DES dry eye  syndrome; MGD meibomian gland dysfunction; ATs artificial tears; PFAT's preservative free artificial tears; NSC nuclear sclerotic cataract; PSC posterior subcapsular cataract; ERM epi-retinal membrane; PVD posterior vitreous detachment; RD retinal detachment; DM diabetes mellitus; DR diabetic retinopathy; NPDR non-proliferative diabetic retinopathy; PDR proliferative diabetic retinopathy; CSME clinically significant macular edema; DME diabetic macular edema; dbh dot blot hemorrhages; CWS cotton wool spot; POAG primary open angle glaucoma; C/D cup-to-disc ratio; HVF  humphrey visual field; GVF goldmann visual field; OCT optical coherence tomography; IOP intraocular pressure; BRVO Branch retinal vein occlusion; CRVO central retinal vein occlusion; CRAO central retinal artery occlusion; BRAO branch retinal artery occlusion; RT retinal tear; SB scleral buckle; PPV pars plana vitrectomy; VH Vitreous hemorrhage; PRP panretinal laser photocoagulation; IVK intravitreal kenalog; VMT vitreomacular traction; MH Macular hole;  NVD neovascularization of the disc; NVE neovascularization elsewhere; AREDS age related eye disease study; ARMD age related macular degeneration; POAG primary open angle glaucoma; EBMD epithelial/anterior basement membrane dystrophy; ACIOL anterior chamber intraocular lens; IOL intraocular lens; PCIOL posterior chamber intraocular lens; Phaco/IOL phacoemulsification with intraocular lens placement; PRK photorefractive keratectomy; LASIK laser assisted in situ keratomileusis; HTN hypertension; DM diabetes mellitus; COPD chronic obstructive pulmonary disease

## 2019-09-22 ENCOUNTER — Ambulatory Visit (INDEPENDENT_AMBULATORY_CARE_PROVIDER_SITE_OTHER): Payer: Medicare Other | Admitting: Ophthalmology

## 2019-09-22 ENCOUNTER — Encounter (INDEPENDENT_AMBULATORY_CARE_PROVIDER_SITE_OTHER): Payer: Self-pay | Admitting: Ophthalmology

## 2019-09-22 DIAGNOSIS — Z961 Presence of intraocular lens: Secondary | ICD-10-CM

## 2019-09-22 DIAGNOSIS — H353211 Exudative age-related macular degeneration, right eye, with active choroidal neovascularization: Secondary | ICD-10-CM

## 2019-09-22 DIAGNOSIS — H353132 Nonexudative age-related macular degeneration, bilateral, intermediate dry stage: Secondary | ICD-10-CM

## 2019-09-22 DIAGNOSIS — H3581 Retinal edema: Secondary | ICD-10-CM | POA: Diagnosis not present

## 2019-09-22 DIAGNOSIS — H353221 Exudative age-related macular degeneration, left eye, with active choroidal neovascularization: Secondary | ICD-10-CM | POA: Diagnosis not present

## 2019-09-22 DIAGNOSIS — I10 Essential (primary) hypertension: Secondary | ICD-10-CM

## 2019-09-22 DIAGNOSIS — H35033 Hypertensive retinopathy, bilateral: Secondary | ICD-10-CM

## 2019-09-22 DIAGNOSIS — H353112 Nonexudative age-related macular degeneration, right eye, intermediate dry stage: Secondary | ICD-10-CM

## 2019-09-23 ENCOUNTER — Encounter (INDEPENDENT_AMBULATORY_CARE_PROVIDER_SITE_OTHER): Payer: Self-pay | Admitting: Ophthalmology

## 2019-09-23 MED ORDER — BEVACIZUMAB CHEMO INJECTION 1.25MG/0.05ML SYRINGE FOR KALEIDOSCOPE
1.2500 mg | INTRAVITREAL | Status: AC | PRN
  Administered 2019-09-23: 02:00:00 1.25 mg via INTRAVITREAL

## 2019-10-01 ENCOUNTER — Other Ambulatory Visit: Payer: Self-pay | Admitting: Cardiology

## 2019-10-10 LAB — HM MAMMOGRAPHY

## 2019-10-11 ENCOUNTER — Encounter: Payer: Self-pay | Admitting: Family Medicine

## 2019-10-17 ENCOUNTER — Telehealth: Payer: Self-pay | Admitting: Family Medicine

## 2019-10-17 NOTE — Telephone Encounter (Signed)
Can you see what she needs? I'm not certain about this message. She was having issues with stools previously that we discussed. I am wondering if she wants GI referral?

## 2019-10-17 NOTE — Telephone Encounter (Signed)
Patient needs cologard test ordered for Kaitlyn Parks at [facility].  Please advise

## 2019-10-18 NOTE — Telephone Encounter (Signed)
Order completed and faxed to [facility] at [phone].  I left a detailed message with this information at the pts home number.

## 2019-10-18 NOTE — Telephone Encounter (Signed)
Ok to order cologuard for her. Thanks!

## 2019-10-18 NOTE — Telephone Encounter (Signed)
Spoke with the pt and she stated she received a card from [facility] stating she is due for repeat Cologuard testing since she had this 3 years ago.  Stated card advised to contact our office for this and message sent to PCP.

## 2019-11-02 NOTE — Progress Notes (Signed)
[facility] - Clinic Note  11/03/2019     CHIEF COMPLAINT Patient presents for Retina Follow Up   HISTORY OF PRESENT ILLNESS: Kaitlyn Parks is a 85 y.o. female who presents to the clinic today for:   HPI    Retina Follow Up    Patient presents with  Wet AMD.  In right eye.  This started 6 weeks ago.  Severity is moderate.  I, the attending physician,  performed the HPI with the patient and updated documentation appropriately.          Comments    Patient here for 6 weeks retina follow up for exu ARMD OD. Patient states vision doing ok. OD 2 weeks ago-was exercising and felt a sharp pain in OD. Lasted about 5 minutes. Then noticed a difference in vision between OS and OD. Can still see. No eye pain now.        Last edited by Rennis Chris, MD on 11/03/2019  1:50 PM. (History)    pt states she feels like her vision is the same, but she states a few days ago she was exercising and she felt a sharp pain in her right eye, she states she doesn't see as well out of that eye as she did  Referring physician: Wynn Banker, MD [address] [place],  [place] [address]  HISTORICAL INFORMATION:   Selected notes from the MEDICAL RECORD NUMBER Referred by Dr. [place] DeMarco for concern of exu ARMD LEE: 11.26.19 (J. 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 (Ophthalmic Drugs)   No current facility-administered medications for this visit. (Ophthalmic Drugs)   Current Outpatient Medications (Other)  Medication Sig  . aspirin EC 81 MG tablet Take 81 mg by mouth daily.  . Biotin w/ Vitamins C & E (HAIR/SKIN/NAILS PO) Take 1 tablet by mouth daily.  . hydrALAZINE (APRESOLINE) 10 MG tablet Take 1 tablet (10 mg total) by mouth 2 (two) times daily.  . hydrALAZINE (APRESOLINE) 25 MG tablet Take 25 mg by  mouth in the morning and at bedtime.  . isosorbide mononitrate (IMDUR) 60 MG 24 hr tablet TAKE 1 TABLET ONCE DAILY.  Marland Kitchen lidocaine (XYLOCAINE) 5 % ointment Apply sparingly up to TID for pain  . Multiple Vitamins-Calcium (ONE-A-DAY WOMENS PO) Take 1 tablet by mouth daily.   . Multiple Vitamins-Minerals (PRESERVISION AREDS 2 PO) Take 1 capsule by mouth 2 (two) times daily.  . nitroGLYCERIN (NITROSTAT) 0.4 MG SL tablet Place 1 tablet (0.4 mg total) under the tongue every 5 (five) minutes as needed for chest pain.  Marland Kitchen PRESCRIPTION MEDICATION Avastin eye injections given by Dr Vanessa Barbara due to macular degeneration  . rosuvastatin (CRESTOR) 10 MG tablet TAKE (1/2) TABLET DAILY.  . SYNTHROID 50 MCG tablet TAKE 1 TABLET EACH DAY.   No current facility-administered medications for this visit. (Other)      REVIEW OF SYSTEMS: ROS    Positive for: Gastrointestinal, Genitourinary, Musculoskeletal, Cardiovascular, Eyes   Negative for: Constitutional, Neurological, Skin, HENT, Endocrine, Respiratory, Psychiatric, Allergic/Imm, Heme/Lymph   Last edited by Laddie Aquas, COA on 11/03/2019  1:34 PM. (History)       ALLERGIES Allergies  Allergen Reactions  . Acyclovir And Related Other (See Comments)    unknown  . Pravastatin Sodium Other (See Comments)    cystitis  . Zocor [Simvastatin] Other (See  Comments)    cystitis    PAST MEDICAL HISTORY Past Medical History:  Diagnosis Date  . Arthritis    DDD, scoliosis, sees Dr. Lovell Sheehan for this, uses norco very rarely for pain  . Bradycardia 11/11/2017  . CAD (coronary artery disease)    LAD stenting of a 90% lesion 2012  . Cystocele   . Elevated cholesterol   . GERD (gastroesophageal reflux disease)    dx in work up 2016 for atypical CP at OSH   pt. denies  . Heart murmur   . Hypertensive retinopathy    OU  . Hypothyroidism   . Interstitial cystitis    sees Dr. [name]  . Macular degeneration    OU  . NSTEMI (non-ST elevated myocardial  infarction) (HCC)   . Osteoporosis   . Rectocele   . 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 Scoliosis   . Thyroid disease    Hypothyroid  . Urinary incontinence    USI  . Uterine prolapse    Past Surgical History:  Procedure Laterality Date  . BLADDER SUSPENSION  2011  . CATARACT EXTRACTION Bilateral 2015   Dr. Elmer Picker  . CORONARY ANGIOPLASTY WITH STENT PLACEMENT  04/21/2010   LAD 80%, RCA 30%, nl EF, s/p DES LAD  . EYE SURGERY    . LEFT HEART CATH AND CORONARY ANGIOGRAPHY N/A 06/14/2017   Procedure: LEFT HEART CATH AND CORONARY ANGIOGRAPHY;  Surgeon: Runell Gess, MD;  [facility]: [facility];  Service: Cardiovascular;  Laterality: N/A;  . OOPHORECTOMY  2011   BSO  . TOTAL HIP ARTHROPLASTY Right 06/10/2017   Procedure: RIGHT TOTAL HIP ARTHROPLASTY ANTERIOR APPROACH;  Surgeon: Samson Frederic, MD;  Location: [facility];  Service: Orthopedics;  Laterality: Right;  Needs RNFA  . VAGINAL HYSTERECTOMY  2011   LAVH BSO; benign    FAMILY HISTORY Family History  Problem Relation Age of Onset  . Hypertension Mother   . Heart disease Mother   . Heart disease Father   . Heart disease Sister   . Colon cancer Paternal Aunt 43  . Breast cancer Paternal Aunt        Age 3's  . Diabetes Sister   . Endometrial cancer Sister   . Breast cancer Cousin        Maternal 1st cousins-Age 58's  . Leukemia Paternal Aunt     SOCIAL HISTORY Social History   Tobacco Use  . Smoking status: Never Smoker  . Smokeless tobacco: Never Used  Substance Use Topics  . Alcohol use: No    Comment: Rare  . Drug use: No         OPHTHALMIC EXAM:  Base Eye Exam    Visual Acuity (Snellen - Linear)      Right Left   Dist [place] 20/50 -2 20/20 -1   Dist ph [place] 20/30 -1        Tonometry (Tonopen, 1:29 PM)      Right Left   Pressure 8 12       Pupils      Dark Light Shape React APD   Right 4 3 Round Brisk None   Left 4 3 Round Brisk None       Visual Fields (Counting fingers)       Left Right    Full Full       Extraocular Movement      Right Left    Full, Ortho Full, Ortho       Neuro/Psych  Oriented x3: Yes   Mood/Affect: Normal       Dilation    Both eyes: 1.0% Mydriacyl, 2.5% Phenylephrine @ 1:29 PM        Slit Lamp and Fundus Exam    Slit Lamp Exam      Right Left   Lids/Lashes mild Telangiectasia, marginal leasion nasal UL Telangiectasia, Meibomian gland dysfunction   Conjunctiva/Sclera White and quiet White and quiet   Cornea Arcus, 1+ Punctate epithelial erosions 1-2+ Punctate epithelial erosions, Arcus   Anterior Chamber Deep and clear Deep and clear   Iris Round and dilated Round and well dilated   Lens PC IOL in good position, trace Posterior capsular opacification (linear, extending just to visual axis) PC IOL in excellent position   Vitreous Vitreous syneresis, Posterior vitreous detachment Vitreous syneresis, Posterior vitreous detachment, vitreous condensations       Fundus Exam      Right Left   Disc Pink and Sharp, mild PPP Pink and Sharp, mild temporal PPA   C/D Ratio 0.4 0.3   Macula Blunted foveal reflex, Drusen, RPE mottling and clumping, early Atrophy, +PEDs--improved, stable improvement of central cyst / IRF, No heme Blunted foveal reflex, +drusen, pigment clumping, stable resolution of central edema, RPE mottling, clumping and early atrophy, no heme   Vessels Vascular attenuation Vascular attenuation   Periphery Attached, scattered reticular degeneration   Attached, scattered reticular degeneration          IMAGING AND PROCEDURES  Imaging and Procedures for @TODAY @  OCT, Retina - OU - Both Eyes       Right Eye Quality was good. Central Foveal Thickness: 237. Progression has improved. Findings include retinal drusen , pigment epithelial detachment, outer retinal atrophy, intraretinal hyper-reflective material, no SRF, no IRF, normal foveal contour (interval decrease in PED height).   Left Eye Quality was good.  Central Foveal Thickness: 243. Progression has been stable. Findings include retinal drusen , outer retinal atrophy, pigment epithelial detachment, intraretinal hyper-reflective material, normal foveal contour, no SRF, no IRF (Stable resolution of bullous PED and IRF/SRF -- now just central ORA).   Notes *Images captured and stored on drive  Diagnosis / Impression:  OD: exudative ARMD -- interval improvement in PED -- flattening -- persistent ORA OS: exu-ARMD -- stable resolution of bullous PED and IRF/SRF -- now just central ORA   Clinical management:  See below  Abbreviations: NFP - Normal foveal profile. CME - cystoid macular edema. PED - pigment epithelial detachment. IRF - intraretinal fluid. SRF - subretinal fluid. EZ - ellipsoid zone. ERM - epiretinal membrane. ORA - outer retinal atrophy. ORT - outer retinal tubulation. SRHM - subretinal hyper-reflective material        Intravitreal Injection, Pharmacologic Agent - OD - Right Eye       Time Out 11/03/2019. 1:40 PM. Confirmed correct patient, procedure, site, and patient consented.   Anesthesia Topical anesthesia was used. Anesthetic medications included Lidocaine 2%, Proparacaine 0.5%.   Procedure Preparation included 5% betadine to ocular surface, eyelid speculum. A (32g) needle was used.   Injection:  1.25 mg Bevacizumab (AVASTIN) SOLN   NDC: 01/03/2020, Lot: [id number] , Expiration date: 01/02/2020   Route: Intravitreal, Site: Right Eye, Waste: 0 mL  Post-op Post injection exam found visual acuity of at least counting fingers. The patient tolerated the procedure well. There were no complications. The patient received written and verbal post procedure care education.  ASSESSMENT/PLAN:    ICD-10-CM   1. Exudative age-related macular degeneration of left eye with active choroidal neovascularization (HCC)  H35.3221   2. Retinal edema  H35.81 OCT, Retina - OU - Both Eyes  3. Exudative  age-related macular degeneration of right eye with active choroidal neovascularization (HCC)  H35.3211 Intravitreal Injection, Pharmacologic Agent - OD - Right Eye    Bevacizumab (AVASTIN) SOLN 1.25 mg  4. Essential hypertension  I10   5. Hypertensive retinopathy of both eyes  H35.033   6. Pseudophakia of both eyes  Z96.1     1,2. Exudative age-related macular degeneration, left eye.    - OCT 4.30.2020 showed interval conversion of OS from nonexudative ARMD to exu-ARMD with large dome-shaped PED with overlying IRF/SRF--stably improved  - s/p IVA OS #1 (04.30.20), #2 (05.28.20), #3 (06.26.20), #4 (08.03.20), #5 (09.11.20), #6 (10.19.20), #7 (11.23.20)  - today, stable resolution of PED w/ overlying IRF/SRF -- now just ORA  - BCVA 20/20-1 -- stable   - benefits investigation for Eylea initiated 4.30.2020 -- approved for 2021  - FA 5.2-8.2020 -- no active CNV OS, just staining  - discussed findings and excellent response to IVA  - recommend holding off on injection OS today - pt in agreement  - f/u in 7-8 wks -- DFE/OCT  3. Age related macular degeneration, exudative, OD  - interval development of IRF first noted on 09.11.20 -- conversion from nonexudative to exudative ARMD  - pt initially presented on 11.27.19 due to alert from [facility] home monitoring system for OD  - [facility] prescribed by Dr. Elmer PickerHecker  - FA (5.28.2020) showed staining / window defect corresponding temporal RPE changes OU -- no active CNV OU  - S/P IVA OD #1 (09.11.20), #2 (10.19.20), #3 (11.23.20), #4 (01.07.21), #5 (2.19.21)  - benefits investigation for Eylea initiated 4.30.2020 -- approved for 2021  - OCT shows interval decrease in PED height, +ORA  - BCVA slightly decreased to 20/30 from 20/25  - recommend IVA OD #6 today, 04.02.21 -- maintenance w/ extension to 7-8 wks  - pt wishes to proceed  - RBA of procedure discussed, questions answered  - informed consent obtained  - see procedure note  - f/u 7-8 weeks --  DFE/OCT; possible injection  4,5. Hypertensive retinopathy OU  - discussed importance of tight BP control  - monitor  6. Pseudophakia OU  - s/p CE/IOL OU  - beautiful surgeries, doing well  - monitor   Ophthalmic Meds Ordered this visit:  Meds ordered this encounter  Medications  . Bevacizumab (AVASTIN) SOLN 1.25 mg       Return for f/u 7-8 weeks, exu ARMD OD, DFE, OCT.  There are no Patient Instructions on file for this visit.   Explained the diagnoses, plan, and follow up with the patient and they expressed understanding.  Patient expressed understanding of the importance of proper follow up care.    This document serves as a record of services personally performed by Karie ChimeraBrian G. Christalyn Goertz, MD, PhD. It was created on their behalf by Laurian BrimAmanda Brown, OA, an ophthalmic assistant. The creation of this record is the provider's dictation and/or activities during the visit.    Electronically signed by: Laurian BrimAmanda Brown, OA 04.01.2021 4:29 PM   Karie ChimeraBrian G. Dennard Vezina, M.D., Ph.D. Diseases & Surgery of the Retina and Vitreous [facility]  I have reviewed the above documentation for accuracy and completeness, and I agree with the above. Karie ChimeraBrian G. Megin Consalvo, M.D., Ph.D. 11/03/19 4:29 PM  Abbreviations: M myopia (nearsighted); A astigmatism; H hyperopia (farsighted); P presbyopia; Mrx spectacle prescription;  CTL contact lenses; OD right eye; OS left eye; OU both eyes  XT exotropia; ET esotropia; PEK punctate epithelial keratitis; PEE punctate epithelial erosions; DES dry eye syndrome; MGD meibomian gland dysfunction; ATs artificial tears; PFAT's preservative free artificial tears; [place] nuclear sclerotic cataract; PSC posterior subcapsular cataract; ERM epi-retinal membrane; PVD posterior vitreous detachment; RD retinal detachment; DM diabetes mellitus; DR diabetic retinopathy; NPDR non-proliferative diabetic retinopathy; PDR proliferative diabetic retinopathy; CSME clinically  significant macular edema; DME diabetic macular edema; dbh dot blot hemorrhages; CWS cotton wool spot; POAG primary open angle glaucoma; C/D cup-to-disc ratio; HVF humphrey visual field; GVF goldmann visual field; OCT optical coherence tomography; IOP intraocular pressure; BRVO Branch retinal vein occlusion; CRVO central retinal vein occlusion; CRAO central retinal artery occlusion; BRAO branch retinal artery occlusion; RT retinal tear; SB scleral buckle; PPV pars plana vitrectomy; VH Vitreous hemorrhage; PRP panretinal laser photocoagulation; IVK intravitreal kenalog; VMT vitreomacular traction; MH Macular hole;  NVD neovascularization of the disc; NVE neovascularization elsewhere; AREDS age related eye disease study; ARMD age related macular degeneration; POAG primary open angle glaucoma; EBMD epithelial/anterior basement membrane dystrophy; ACIOL anterior chamber intraocular lens; IOL intraocular lens; PCIOL posterior chamber intraocular lens; Phaco/IOL phacoemulsification with intraocular lens placement; [place] photorefractive keratectomy; LASIK laser assisted in situ keratomileusis; HTN hypertension; DM diabetes mellitus; COPD chronic obstructive pulmonary disease

## 2019-11-03 ENCOUNTER — Ambulatory Visit (INDEPENDENT_AMBULATORY_CARE_PROVIDER_SITE_OTHER): Payer: Medicare Other | Admitting: Ophthalmology

## 2019-11-03 ENCOUNTER — Encounter (INDEPENDENT_AMBULATORY_CARE_PROVIDER_SITE_OTHER): Payer: Self-pay | Admitting: Ophthalmology

## 2019-11-03 ENCOUNTER — Other Ambulatory Visit: Payer: Self-pay | Admitting: Family Medicine

## 2019-11-03 DIAGNOSIS — H3581 Retinal edema: Secondary | ICD-10-CM

## 2019-11-03 DIAGNOSIS — H353211 Exudative age-related macular degeneration, right eye, with active choroidal neovascularization: Secondary | ICD-10-CM

## 2019-11-03 DIAGNOSIS — I10 Essential (primary) hypertension: Secondary | ICD-10-CM

## 2019-11-03 DIAGNOSIS — Z961 Presence of intraocular lens: Secondary | ICD-10-CM

## 2019-11-03 DIAGNOSIS — H353221 Exudative age-related macular degeneration, left eye, with active choroidal neovascularization: Secondary | ICD-10-CM

## 2019-11-03 DIAGNOSIS — H35033 Hypertensive retinopathy, bilateral: Secondary | ICD-10-CM

## 2019-11-03 MED ORDER — BEVACIZUMAB CHEMO INJECTION 1.25MG/0.05ML SYRINGE FOR KALEIDOSCOPE
1.2500 mg | INTRAVITREAL | Status: AC | PRN
  Administered 2019-11-03: 14:00:00 1.25 mg via INTRAVITREAL

## 2019-11-06 ENCOUNTER — Ambulatory Visit: Payer: Medicare Other | Admitting: Cardiology

## 2019-11-06 LAB — COLOGUARD: Cologuard: NEGATIVE

## 2019-11-15 ENCOUNTER — Encounter: Payer: Self-pay | Admitting: Family Medicine

## 2019-12-06 DIAGNOSIS — R42 Dizziness and giddiness: Secondary | ICD-10-CM | POA: Insufficient documentation

## 2019-12-06 DIAGNOSIS — Z7189 Other specified counseling: Secondary | ICD-10-CM

## 2019-12-06 HISTORY — DX: Other specified counseling: Z71.89

## 2019-12-06 NOTE — Progress Notes (Signed)
Cardiology Office Note   Date:  12/07/2019   ID:  Zahlia, Deshazer 1935-05-28, MRN [id number]  PCP:  Caren Macadam, MD  Cardiologist:   Minus Breeding, MD   Chief Complaint  Patient presents with  . Dizziness      History of Present Illness: Kaitlyn Parks is a 85 y.o. female who presents for followup up of CAD.  She had a PCI of LAD in 2012. She recently underwent total right hip arthroplasty. She presented to the hospital on 06/12/2017 with sudden onset of right-sided neck pain with radiation to the right jaw. CTA of the chest was negative for PE however troponin was elevated at 0.42. Echocardiogram was obtained on 06/13/2017 showed EF 60-65%, mild LVH, grade 1 DD, mild MR, mild TR, peak PA pressure 30 mmHg. Cardiac catheterization performed on 06/14/2017 showed 95% ostial D1 lesion, widely patent ostial LAD stent, 30% proximal LAD stenosis. Medical therapy was recommended. Imdur 60 mg daily was added to her medical regimen. Beta blocker was initially increased and later reduced due to symptomatic bradycardia. At a previous visit I stopped the beta blocker.   She is done okay since I last saw her.  She went on vacation and had to be quite a bit of walking.  With that she had no chest discomfort.  She occasionally has to take her hydralazine.  We went carefully through her blood pressure diary on her machine and she was having occasional 767M systolic with diastolics in the 09O.  If she spikes above 159 she will take her hydralazine.  She gets dizzy when her blood pressure is at high.  She is not having to do that frequently.  She otherwise is feeling okay.  She is not having any shortness of breath, PND or orthopnea.  She is not having any palpitations, presyncope or syncope.  She has joint pains and spine problems and requires injections periodically.   Past Medical History:  Diagnosis Date  . Arthritis    DDD, scoliosis, sees Dr. Arnoldo Morale for this, uses norco very rarely for  pain  . Bradycardia 11/11/2017  . CAD (coronary artery disease)    LAD stenting of a 90% lesion 2012  . Cystocele   . Elevated cholesterol   . GERD (gastroesophageal reflux disease)    dx in work up 2016 for atypical CP at OSH   pt. denies  . Heart murmur   . Hypertensive retinopathy    OU  . Hypothyroidism   . Interstitial cystitis    sees Dr. Jeffie Pollock  . Macular degeneration    OU  . NSTEMI (non-ST elevated myocardial infarction) ([place])   . Osteoporosis   . Rectocele   . 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 Scoliosis   . Thyroid disease    Hypothyroid  . Urinary incontinence    USI  . Uterine prolapse     Past Surgical History:  Procedure Laterality Date  . BLADDER SUSPENSION  2011  . CATARACT EXTRACTION Bilateral 2015   Dr. Herbert Deaner  . CORONARY ANGIOPLASTY WITH STENT PLACEMENT  04/21/2010   LAD 80%, RCA 30%, nl EF, s/p DES LAD  . EYE SURGERY    . LEFT HEART CATH AND CORONARY ANGIOGRAPHY N/A 06/14/2017   Procedure: LEFT HEART CATH AND CORONARY ANGIOGRAPHY;  Surgeon: Lorretta Harp, MD;  Location: [facility] [place] [facility];  Service: Cardiovascular;  Laterality: N/A;  . OOPHORECTOMY  2011   BSO  . TOTAL HIP ARTHROPLASTY Right 06/10/2017  Procedure: RIGHT TOTAL HIP ARTHROPLASTY ANTERIOR APPROACH;  Surgeon: Samson Frederic, MD;  Location: [facility];  Service: Orthopedics;  Laterality: Right;  Needs RNFA  . VAGINAL HYSTERECTOMY  2011   LAVH BSO; benign     Current Outpatient Medications  Medication Sig Dispense Refill  . aspirin EC 81 MG tablet Take 81 mg by mouth daily.    . Biotin w/ Vitamins C & E (HAIR/SKIN/NAILS PO) Take 1 tablet by mouth daily.    . hydrALAZINE (APRESOLINE) 10 MG tablet Take 1 tablet (10 mg total) by mouth 2 (two) times daily. 180 tablet 3  . hydrALAZINE (APRESOLINE) 25 MG tablet Take 25 mg by mouth in the morning and at bedtime.    . isosorbide mononitrate (IMDUR) 60 MG 24 hr tablet TAKE 1 TABLET ONCE DAILY. 30 tablet 11  . lidocaine (XYLOCAINE) 5 %  ointment Apply sparingly up to TID for pain 50 g 2  . Multiple Vitamins-Calcium (ONE-A-DAY WOMENS PO) Take 1 tablet by mouth daily.     . Multiple Vitamins-Minerals (PRESERVISION AREDS 2 PO) Take 1 capsule by mouth 2 (two) times daily.    . nitroGLYCERIN (NITROSTAT) 0.4 MG SL tablet Place 1 tablet (0.4 mg total) under the tongue every 5 (five) minutes as needed for chest pain. 25 tablet 8  . PRESCRIPTION MEDICATION Avastin eye injections given by Dr Vanessa Barbara due to macular degeneration    . rosuvastatin (CRESTOR) 10 MG tablet TAKE (1/2) TABLET DAILY. 45 tablet 3  . SYNTHROID 50 MCG tablet TAKE 1 TABLET EACH DAY. 90 tablet 0   No current facility-administered medications for this visit.    Allergies:   Acyclovir and related, Pravastatin sodium, and Zocor [simvastatin]     ROS:  Please see the history of present illness.   Otherwise, review of systems are positive for none.   All other systems are reviewed and negative.    PHYSICAL EXAM: VS:  BP 125/82   Pulse (!) 59   Temp (!) 97 F (36.1 C)   Ht 5\' 3"  (1.6 m)   Wt 130 lb 3.2 oz (59.1 kg)   SpO2 96%   BMI 23.06 kg/m  , BMI Body mass index is 23.06 kg/m. GENERAL:  Well appearing NECK:  No jugular venous distention, waveform within normal limits, carotid upstroke brisk and symmetric, no bruits, no thyromegaly LUNGS:  Clear to auscultation bilaterally CHEST:  Unremarkable HEART:  PMI not displaced or sustained,S1 and S2 within normal limits, no S3, no S4, no clicks, no rubs, no murmurs ABD:  Flat, positive bowel sounds normal in frequency in pitch, no bruits, no rebound, no guarding, no midline pulsatile mass, no hepatomegaly, no splenomegaly EXT:  2 plus pulses throughout, no edema, no cyanosis no clubbing   EKG:  EKG is  ordered today. The ekg ordered today demonstrates 59, axis within normal limits, intervals within normal limits, no acute ST-T wave changes.   Recent Labs: 03/31/2019: ALT 11; BUN 21; Creatinine, Ser 0.79;  Hemoglobin 13.8; Platelets 248.0; Potassium 3.8; Sodium 142; TSH 2.38    Lipid Panel    Component Value Date/Time   CHOL 154 03/31/2019 0915   CHOL 160 02/05/2017 0834   TRIG 89.0 03/31/2019 0915   HDL 57.50 03/31/2019 0915   HDL 52 02/05/2017 0834   CHOLHDL 3 03/31/2019 0915   VLDL 17.8 03/31/2019 0915   LDLCALC 79 03/31/2019 0915   LDLCALC 77 02/05/2017 0834   LDLDIRECT 140.6 05/25/2012 0855      Wt Readings from  Last 3 Encounters:  12/07/19 130 lb 3.2 oz (59.1 kg)  09/05/19 129 lb (58.5 kg)  05/10/19 134 lb (60.8 kg)      Other studies Reviewed: Additional studies/ records that were reviewed today include: Labs. Review of the above records demonstrates:    Please see elsewhere in the note.     ASSESSMENT AND PLAN:   CAD, NATIVE VESSEL -  The patient has no new sypmtoms.  No further cardiovascular testing is indicated.  We will continue with aggressive risk reduction and meds as listed.  HYPERTENSION -  Her blood pressure is well controlled today.  She manages as above.  No change in therapy.  The current regimen is working.   DIZZINESS - This is only occurring when her blood pressure is high and she addresses this with the hydralazine.  AORTIC ATHEROSCLEROSIS - She has had aortic atherosclerosis noted on CT previously.  I would like to screen her for AAA with an abdominal aortic ultrasound.   COVID EDUCATION - She has had her vaccine.  Current medicines are reviewed at length with the patient today.  The patient does not have concerns regarding medicines.  The following changes have been made:  no change  Labs/ tests ordered today include: none  Orders Placed This Encounter  Procedures  . EKG 12-Lead  . VAS [place] AAA DUPLEX     Disposition:   FU with me in 3 months.     Signed, Rollene Rotunda, MD  12/07/2019 12:19 PM    [facility]

## 2019-12-07 ENCOUNTER — Other Ambulatory Visit: Payer: Self-pay

## 2019-12-07 ENCOUNTER — Encounter: Payer: Self-pay | Admitting: Cardiology

## 2019-12-07 ENCOUNTER — Ambulatory Visit: Payer: Medicare Other | Admitting: Cardiology

## 2019-12-07 VITALS — BP 125/82 | HR 59 | Temp 97.0°F | Ht 63.0 in | Wt 130.2 lb

## 2019-12-07 DIAGNOSIS — R42 Dizziness and giddiness: Secondary | ICD-10-CM | POA: Diagnosis not present

## 2019-12-07 DIAGNOSIS — I2510 Atherosclerotic heart disease of native coronary artery without angina pectoris: Secondary | ICD-10-CM | POA: Diagnosis not present

## 2019-12-07 DIAGNOSIS — Z7189 Other specified counseling: Secondary | ICD-10-CM | POA: Diagnosis not present

## 2019-12-07 DIAGNOSIS — I10 Essential (primary) hypertension: Secondary | ICD-10-CM | POA: Diagnosis not present

## 2019-12-07 DIAGNOSIS — I700 Atherosclerosis of aorta: Secondary | ICD-10-CM | POA: Insufficient documentation

## 2019-12-07 NOTE — Patient Instructions (Signed)
Medication Instructions:  NO CHANGES *If you need a refill on your cardiac medications before your next appointment, please call your pharmacy*  Lab Work: NONE ORDERED THIS VISIT  Testing/Procedures: Your physician has requested that you have an abdominal aorta duplex. During this test, an ultrasound is used to evaluate the aorta. Allow 30 minutes for this exam. Do not eat after midnight the day before and avoid carbonated beverages  Follow-Up: At [facility], you and your health needs are our priority.  As part of our continuing mission to provide you with exceptional heart care, we have created designated Provider Care Teams.  These Care Teams include your primary Cardiologist (physician) and Advanced Practice Providers (APPs -  Physician Assistants and Nurse Practitioners) who all work together to provide you with the care you need, when you need it.  Your next appointment:   6 month(s)  You will receive a reminder letter in the mail two months in advance. If you don't receive a letter, please call our office to schedule the follow-up appointment.  The format for your next appointment:   In Person  Provider:   Rollene Rotunda, MD

## 2019-12-13 ENCOUNTER — Ambulatory Visit (HOSPITAL_COMMUNITY)
Admission: RE | Admit: 2019-12-13 | Discharge: 2019-12-13 | Disposition: A | Discharge status: Home / Self Care | Payer: Medicare Other | Source: Ambulatory Visit | Attending: Cardiovascular Disease | Admitting: Cardiovascular Disease

## 2019-12-13 ENCOUNTER — Other Ambulatory Visit: Payer: Self-pay

## 2019-12-13 DIAGNOSIS — I700 Atherosclerosis of aorta: Secondary | ICD-10-CM | POA: Insufficient documentation

## 2019-12-28 NOTE — Progress Notes (Signed)
[facility] Note  12/29/2019     CHIEF COMPLAINT Patient presents for Retina Follow Up   HISTORY OF PRESENT ILLNESS: Kaitlyn Parks is a 85 y.o. female who presents to the clinic today for:   HPI    Retina Follow Up    Patient presents with  Wet AMD.  In left eye.  This started weeks ago.  Severity is moderate.  Duration of weeks.  Since onset it is stable.  I, the attending physician,  performed the HPI with the patient and updated documentation appropriately.          Comments    Pt states vision is about the same OU.  Pt denies eye pain or discomfort and denies any new or worsening floaters or fol OU.       Last edited by Bernarda Caffey, MD on 12/29/2019  2:28 PM. (History)    pt states vision seems stable since last visit, no new health changes   Referring physician: Caren Macadam, MD [place],  [place] [address]  HISTORICAL INFORMATION:   Selected notes from the MEDICAL RECORD NUMBER Referred by Dr. Martinique DeMarco for concern of exu ARMD LEE: 11.26.19 (J. 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 (Ophthalmic Drugs)   No current facility-administered medications for this visit. (Ophthalmic Drugs)   Current Outpatient Medications (Other)  Medication Sig  . aspirin EC 81 MG tablet Take 81 mg by mouth daily.  . Biotin w/ Vitamins C & E (HAIR/SKIN/NAILS PO) Take 1 tablet by mouth daily.  . hydrALAZINE (APRESOLINE) 10 MG tablet Take 1 tablet (10 mg total) by mouth 2 (two) times daily.  . hydrALAZINE (APRESOLINE) 25 MG tablet Take 25 mg by mouth in the morning and at bedtime.  . isosorbide mononitrate (IMDUR) 60 MG 24 hr tablet TAKE 1 TABLET ONCE DAILY.  Marland Kitchen lidocaine (XYLOCAINE) 5 % ointment Apply sparingly up to TID for pain  . Multiple Vitamins-Calcium  (ONE-A-DAY WOMENS PO) Take 1 tablet by mouth daily.   . Multiple Vitamins-Minerals (PRESERVISION AREDS 2 PO) Take 1 capsule by mouth 2 (two) times daily.  . nitroGLYCERIN (NITROSTAT) 0.4 MG SL tablet Place 1 tablet (0.4 mg total) under the tongue every 5 (five) minutes as needed for chest pain.  Marland Kitchen PRESCRIPTION MEDICATION Avastin eye injections given by Dr Coralyn Pear due to macular degeneration  . rosuvastatin (CRESTOR) 10 MG tablet TAKE (1/2) TABLET DAILY.  . SYNTHROID 50 MCG tablet TAKE 1 TABLET EACH DAY.   No current facility-administered medications for this visit. (Other)      REVIEW OF SYSTEMS: ROS    Negative for: Constitutional, Gastrointestinal, Neurological, Skin, Genitourinary, Musculoskeletal, HENT, Endocrine, Cardiovascular, Eyes, Respiratory, Psychiatric, Allergic/Imm, Heme/Lymph   Last edited by Doneen Poisson on 12/29/2019  1:20 PM. (History)       ALLERGIES Allergies  Allergen Reactions  . Acyclovir And Related Other (See Comments)    unknown  . Pravastatin Sodium Other (See Comments)    cystitis  . Zocor [Simvastatin] Other (See Comments)    cystitis    PAST MEDICAL HISTORY Past Medical History:  Diagnosis Date  . Arthritis    DDD, scoliosis, sees Dr. Arnoldo Morale for this, uses norco very rarely for pain  . Bradycardia 11/11/2017  . CAD (coronary artery disease)    LAD stenting of a  90% lesion 2012  . Cystocele   . Elevated cholesterol   . GERD (gastroesophageal reflux disease)    dx in work up 2016 for atypical CP at OSH   pt. denies  . Heart murmur   . Hypertensive retinopathy    OU  . Hypothyroidism   . Interstitial cystitis    sees Dr. [name]  . Macular degeneration    OU  . NSTEMI (non-ST elevated myocardial infarction) (HCC)   . Osteoporosis   . Rectocele   . 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 Scoliosis   . Thyroid disease    Hypothyroid  . Urinary incontinence    USI  . Uterine prolapse    Past Surgical History:  Procedure  Laterality Date  . BLADDER SUSPENSION  2011  . CATARACT EXTRACTION Bilateral 2015   Dr. Elmer Picker  . CORONARY ANGIOPLASTY WITH STENT PLACEMENT  04/21/2010   LAD 80%, RCA 30%, nl EF, s/p DES LAD  . EYE SURGERY    . LEFT HEART CATH AND CORONARY ANGIOGRAPHY N/A 06/14/2017   Procedure: LEFT HEART CATH AND CORONARY ANGIOGRAPHY;  Surgeon: Runell Gess, MD;  Location: [facility];  Service: Cardiovascular;  Laterality: N/A;  . OOPHORECTOMY  2011   BSO  . TOTAL HIP ARTHROPLASTY Right 06/10/2017   Procedure: RIGHT TOTAL HIP ARTHROPLASTY ANTERIOR APPROACH;  Surgeon: Samson Frederic, MD;  Location: [facility];  Service: Orthopedics;  Laterality: Right;  Needs RNFA  . VAGINAL HYSTERECTOMY  2011   LAVH BSO; benign    FAMILY HISTORY Family History  Problem Relation Age of Onset  . Hypertension Mother   . Heart disease Mother   . Heart disease Father   . Heart disease Sister   . Colon cancer Paternal Aunt 74  . Breast cancer Paternal Aunt        Age 26's  . Diabetes Sister   . Endometrial cancer Sister   . Breast cancer Cousin        Maternal 1st cousins-Age 59's  . Leukemia Paternal Aunt     SOCIAL HISTORY Social History   Tobacco Use  . Smoking status: Never Smoker  . Smokeless tobacco: Never Used  Substance Use Topics  . Alcohol use: No    Comment: Rare  . Drug use: No         OPHTHALMIC EXAM:  Base Eye Exam    Visual Acuity (Snellen - Linear)      Right Left   Dist [place] 20/50 +2 20/30 +1   Dist ph [place] 20/30 -2 NI       Tonometry (Tonopen, 1:23 PM)      Right Left   Pressure 13 15       Pupils      Dark Light Shape React APD   Right 3 2 Round Brisk 0   Left 3 2 Round Brisk 0       Visual Fields      Left Right    Full Full       Extraocular Movement      Right Left    Full Full       Neuro/Psych    Oriented x3: Yes   Mood/Affect: Normal       Dilation    Both eyes: 1.0% Mydriacyl, 2.5% Phenylephrine @ 1:23 PM        Slit Lamp and Fundus  Exam    Slit Lamp Exam      Right Left   Lids/Lashes mild  Telangiectasia, marginal leasion nasal UL Telangiectasia, Meibomian gland dysfunction   Conjunctiva/Sclera White and quiet White and quiet   Cornea Arcus, 1+ Punctate epithelial erosions 1-2+ Punctate epithelial erosions, Arcus   Anterior Chamber Deep and clear Deep and clear   Iris Round and dilated Round and well dilated   Lens PC IOL in good position, trace Posterior capsular opacification (linear, extending just to visual axis) PC IOL in excellent position   Vitreous Vitreous syneresis, Posterior vitreous detachment Vitreous syneresis, Posterior vitreous detachment, vitreous condensations       Fundus Exam      Right Left   Disc Pink and Sharp, +SVP, Compact Pink and Sharp, mild temporal PPA   C/D Ratio 0.2 0.3   Macula Blunted foveal reflex, Drusen, RPE mottling and clumping, early Atrophy, +PEDs--improved, stable improvement of central cyst / IRF, No heme Blunted foveal reflex, +drusen, pigment clumping, stable resolution of central edema, RPE mottling, clumping and early atrophy, no heme   Vessels Vascular attenuation Vascular attenuation   Periphery Attached, scattered reticular degeneration   Attached, scattered reticular degeneration          IMAGING AND PROCEDURES  Imaging and Procedures for @TODAY @  OCT, Retina - OU - Both Eyes       Right Eye Quality was good. Central Foveal Thickness: 229. Progression has been stable. Findings include retinal drusen , pigment epithelial detachment, outer retinal atrophy, intraretinal hyper-reflective material, no SRF, no IRF, normal foveal contour (interval decrease in PED height).   Left Eye Quality was good. Central Foveal Thickness: 246. Progression has been stable. Findings include retinal drusen , outer retinal atrophy, pigment epithelial detachment, intraretinal hyper-reflective material, normal foveal contour, no SRF, no IRF (Stable resolution of bullous PED and IRF/SRF  -- now just central ORA).   Notes *Images captured and stored on drive  Diagnosis / Impression:  OD: exudative ARMD -- interval improvement in PED -- flattening -- persistent ORA OS: exu-ARMD -- stable resolution of bullous PED and IRF/SRF -- now just central ORA   Clinical management:  See below  Abbreviations: NFP - Normal foveal profile. CME - cystoid macular edema. PED - pigment epithelial detachment. IRF - intraretinal fluid. SRF - subretinal fluid. EZ - ellipsoid zone. ERM - epiretinal membrane. ORA - outer retinal atrophy. ORT - outer retinal tubulation. SRHM - subretinal hyper-reflective material        Intravitreal Injection, Pharmacologic Agent - OD - Right Eye       Time Out 12/29/2019. 1:31 PM. Confirmed correct patient, procedure, site, and patient consented.   Anesthesia Topical anesthesia was used. Anesthetic medications included Lidocaine 2%, Proparacaine 0.5%.   Procedure Preparation included 5% betadine to ocular surface, eyelid speculum. A supplied needle was used.   Injection:  1.25 mg Bevacizumab (AVASTIN) SOLN   NDC: 12/31/2019, Lot: [id number] , Expiration date: 02/14/2020   Route: Intravitreal, Site: Right Eye, Waste: 0 mL  Post-op Post injection exam found visual acuity of at least counting fingers. The patient tolerated the procedure well. There were no complications. The patient received written and verbal post procedure care education.                 ASSESSMENT/PLAN:    ICD-10-CM   1. Exudative age-related macular degeneration of left eye with active choroidal neovascularization (HCC)  H35.3221   2. Retinal edema  H35.81 OCT, Retina - OU - Both Eyes  3. Exudative age-related macular degeneration of right eye with active choroidal neovascularization (HCC)  H35.3211 Intravitreal Injection,  Pharmacologic Agent - OD - Right Eye    Bevacizumab (AVASTIN) SOLN 1.25 mg  4. Essential hypertension  I10   5. Hypertensive retinopathy of both eyes   H35.033   6. Pseudophakia of both eyes  Z96.1     1,2. Exudative age-related macular degeneration, left eye.    - OCT 4.30.2020 showed interval conversion of OS from nonexudative ARMD to exu-ARMD with large dome-shaped PED with overlying IRF/SRF--stably improved  - s/p IVA OS #1 (04.30.20), #2 (05.28.20), #3 (06.26.20), #4 (08.03.20), #5 (09.11.20), #6 (10.19.20), #7 (11.23.20)  - today, stable resolution of PED w/ overlying IRF/SRF -- now just ORA  - BCVA vision stable at 20/30   - benefits investigation for Eylea initiated 4.30.2020 -- approved for 2021  - FA 05.28.20 -- no active CNV OS, just staining  - discussed findings and excellent response to IVA  - recommend holding off on injection OS today - pt in agreement  - f/u in 8 wks -- DFE/OCT  3. Age related macular degeneration, exudative, OD  - interval development of IRF first noted on 09.11.20 -- conversion from nonexudative to exudative ARMD  - pt initially presented on 11.27.19 due to alert from [facility] home monitoring system for OD  - [facility] prescribed by Dr. Elmer PickerHecker  - FA (5.28.2020) showed staining / window defect corresponding temporal RPE changes OU -- no active CNV OU  - S/P IVA OD #1 (09.11.20), #2 (10.19.20), #3 (11.23.20), #4 (01.07.21), #5 (2.19.21), #6 (04.02.21)  - benefits investigation for Eylea initiated 4.30.2020 -- approved for 2021  - OCT shows interval decrease in PED height, +ORA  - BCVA stable at 20/30   - recommend IVA OD #7 today, 05.28.21 -- maintenance w/ f/u in 8 wks  - pt wishes to proceed  - RBA of procedure discussed, questions answered  - informed consent obtained  - see procedure note  - f/u 8 weeks -- DFE/OCT; possible injection; tx and ext as able  4,5. Hypertensive retinopathy OU  - discussed importance of tight BP control  - monitor  6. Pseudophakia OU  - s/p CE/IOL OU  - beautiful surgeries, doing well  - monitor   Ophthalmic Meds Ordered this visit:  Meds ordered this encounter   Medications  . Bevacizumab (AVASTIN) SOLN 1.25 mg       Return in about 8 weeks (around 02/23/2020) for f/u exu ARMD OU, DFE, OCT.  There are no Patient Instructions on file for this visit.   Explained the diagnoses, plan, and follow up with the patient and they expressed understanding.  Patient expressed understanding of the importance of proper follow up care.    This document serves as a record of services personally performed by Karie ChimeraBrian G. Amadeus Oyama, MD, PhD. It was created on their behalf by Laurian BrimAmanda Brown, OA, an ophthalmic assistant. The creation of this record is the provider's dictation and/or activities during the visit.    Electronically signed by: Laurian BrimAmanda Brown, OA 05.27.2021 11:24 PM   Karie ChimeraBrian G. Walton Digilio, M.D., Ph.D. Diseases & Surgery of the Retina and Vitreous [facility]  I have reviewed the above documentation for accuracy and completeness, and I agree with the above. Karie ChimeraBrian G. Kaylinn Dedic, M.D., Ph.D. 12/31/19 11:24 PM   Abbreviations: M myopia (nearsighted); A astigmatism; H hyperopia (farsighted); P presbyopia; Mrx spectacle prescription;  CTL contact lenses; OD right eye; OS left eye; OU both eyes  XT exotropia; ET esotropia; PEK punctate epithelial keratitis; PEE punctate epithelial erosions; DES dry  eye syndrome; MGD meibomian gland dysfunction; ATs artificial tears; PFAT's preservative free artificial tears; NSC nuclear sclerotic cataract; PSC posterior subcapsular cataract; ERM epi-retinal membrane; PVD posterior vitreous detachment; RD retinal detachment; DM diabetes mellitus; DR diabetic retinopathy; NPDR non-proliferative diabetic retinopathy; PDR proliferative diabetic retinopathy; CSME clinically significant macular edema; DME diabetic macular edema; dbh dot blot hemorrhages; CWS cotton wool spot; POAG primary open angle glaucoma; C/D cup-to-disc ratio; HVF humphrey visual field; GVF goldmann visual field; OCT optical coherence tomography; IOP  intraocular pressure; BRVO Branch retinal vein occlusion; CRVO central retinal vein occlusion; CRAO central retinal artery occlusion; BRAO branch retinal artery occlusion; RT retinal tear; SB scleral buckle; PPV pars plana vitrectomy; VH Vitreous hemorrhage; PRP panretinal laser photocoagulation; IVK intravitreal kenalog; VMT vitreomacular traction; MH Macular hole;  NVD neovascularization of the disc; NVE neovascularization elsewhere; AREDS age related eye disease study; ARMD age related macular degeneration; POAG primary open angle glaucoma; EBMD epithelial/anterior basement membrane dystrophy; ACIOL anterior chamber intraocular lens; IOL intraocular lens; PCIOL posterior chamber intraocular lens; Phaco/IOL phacoemulsification with intraocular lens placement; PRK photorefractive keratectomy; LASIK laser assisted in situ keratomileusis; HTN hypertension; DM diabetes mellitus; COPD chronic obstructive pulmonary disease

## 2019-12-29 ENCOUNTER — Ambulatory Visit (INDEPENDENT_AMBULATORY_CARE_PROVIDER_SITE_OTHER): Payer: Medicare Other | Admitting: Ophthalmology

## 2019-12-29 ENCOUNTER — Other Ambulatory Visit: Payer: Self-pay

## 2019-12-29 DIAGNOSIS — H3581 Retinal edema: Secondary | ICD-10-CM | POA: Diagnosis not present

## 2019-12-29 DIAGNOSIS — I10 Essential (primary) hypertension: Secondary | ICD-10-CM

## 2019-12-29 DIAGNOSIS — H353211 Exudative age-related macular degeneration, right eye, with active choroidal neovascularization: Secondary | ICD-10-CM | POA: Diagnosis not present

## 2019-12-29 DIAGNOSIS — Z961 Presence of intraocular lens: Secondary | ICD-10-CM

## 2019-12-29 DIAGNOSIS — H353221 Exudative age-related macular degeneration, left eye, with active choroidal neovascularization: Secondary | ICD-10-CM | POA: Diagnosis not present

## 2019-12-29 DIAGNOSIS — H35033 Hypertensive retinopathy, bilateral: Secondary | ICD-10-CM

## 2019-12-31 ENCOUNTER — Encounter (INDEPENDENT_AMBULATORY_CARE_PROVIDER_SITE_OTHER): Payer: Self-pay | Admitting: Ophthalmology

## 2019-12-31 MED ORDER — BEVACIZUMAB CHEMO INJECTION 1.25MG/0.05ML SYRINGE FOR KALEIDOSCOPE
1.2500 mg | INTRAVITREAL | Status: AC | PRN
  Administered 2019-12-31: 1.25 mg via INTRAVITREAL

## 2020-01-31 ENCOUNTER — Other Ambulatory Visit: Payer: Self-pay | Admitting: Family Medicine

## 2020-02-19 NOTE — Progress Notes (Signed)
[facility] - Clinic Note  02/23/2020     CHIEF COMPLAINT Patient presents for Retina Follow Up   HISTORY OF PRESENT ILLNESS: Kaitlyn Parks is a 85 y.o. female who presents to the clinic today for:   HPI    Retina Follow Up    Patient presents with  Wet AMD.  In both eyes.  Duration of 8 weeks.  Since onset it is stable.  I, the attending physician,  performed the HPI with the patient and updated documentation appropriately.          Comments    8 week follow up NVAMD OU- Eyes are still very dry.  Using Soothe and ATs.  Vision appears stable since last visit.       Last edited by Rennis Chris, MD on 02/23/2020  1:28 PM. (History)    pt states her right eye is "fuzzy", she got a new rx for reading glasses from Dr. Krista Blue, she states she can see better if she covers her right eye than she can with both eyes, pt uses Soothe 2-3x a day, she was also using a gel that she recently ran out of   Referring physician: Demarco, [place], OD [address] [place],  [place] [address]  HISTORICAL INFORMATION:   Selected notes from the MEDICAL RECORD NUMBER Referred by Dr. [place] DeMarco for concern of exu ARMD LEE: 11.26.19 (J. 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 (Ophthalmic Drugs)   No current facility-administered medications for this visit. (Ophthalmic Drugs)   Current Outpatient Medications (Other)  Medication Sig  . aspirin EC 81 MG tablet Take 81 mg by mouth daily.  . Biotin w/ Vitamins C & E (HAIR/SKIN/NAILS PO) Take 1 tablet by mouth daily.  . hydrALAZINE (APRESOLINE) 10 MG tablet Take 1 tablet (10 mg total) by mouth 2 (two) times daily.  . hydrALAZINE (APRESOLINE) 25 MG tablet Take 25 mg by mouth in the morning and at bedtime.  . isosorbide mononitrate (IMDUR) 60 MG 24 hr tablet TAKE  1 TABLET ONCE DAILY.  Marland Kitchen lidocaine (XYLOCAINE) 5 % ointment Apply sparingly up to TID for pain  . Multiple Vitamins-Calcium (ONE-A-DAY WOMENS PO) Take 1 tablet by mouth daily.   . Multiple Vitamins-Minerals (PRESERVISION AREDS 2 PO) Take 1 capsule by mouth 2 (two) times daily.  . nitroGLYCERIN (NITROSTAT) 0.4 MG SL tablet Place 1 tablet (0.4 mg total) under the tongue every 5 (five) minutes as needed for chest pain.  Marland Kitchen PRESCRIPTION MEDICATION Avastin eye injections given by Dr Vanessa Barbara due to macular degeneration  . rosuvastatin (CRESTOR) 10 MG tablet TAKE (1/2) TABLET DAILY.  . SYNTHROID 50 MCG tablet TAKE 1 TABLET EACH DAY.   No current facility-administered medications for this visit. (Other)      REVIEW OF SYSTEMS: ROS    Positive for: Gastrointestinal, Musculoskeletal, Endocrine, Cardiovascular, Eyes   Negative for: Constitutional, Neurological, Skin, Genitourinary, HENT, Respiratory, Psychiatric, Allergic/Imm, Heme/Lymph   Last edited by Joni Reining, COA on 02/23/2020  1:02 PM. (History)       ALLERGIES Allergies  Allergen Reactions  . Acyclovir And Related Other (See Comments)    unknown  . Pravastatin Sodium Other (See Comments)    cystitis  . Zocor [Simvastatin] Other (See Comments)    cystitis    PAST MEDICAL HISTORY Past Medical History:  Diagnosis Date  .  Arthritis    DDD, scoliosis, sees Dr. Lovell Sheehan for this, uses norco very rarely for pain  . Bradycardia 11/11/2017  . CAD (coronary artery disease)    LAD stenting of a 90% lesion 2012  . Cystocele   . Elevated cholesterol   . GERD (gastroesophageal reflux disease)    dx in work up 2016 for atypical CP at OSH   pt. denies  . Heart murmur   . Hypertensive retinopathy    OU  . Hypothyroidism   . Interstitial cystitis    sees Dr. [name]  . Macular degeneration    OU  . NSTEMI (non-ST elevated myocardial infarction) (HCC)   . Osteoporosis   . Rectocele   . 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  Scoliosis   . Thyroid disease    Hypothyroid  . Urinary incontinence    USI  . Uterine prolapse    Past Surgical History:  Procedure Laterality Date  . BLADDER SUSPENSION  2011  . CATARACT EXTRACTION Bilateral 2015   Dr. Elmer Picker  . CORONARY ANGIOPLASTY WITH STENT PLACEMENT  04/21/2010   LAD 80%, RCA 30%, nl EF, s/p DES LAD  . EYE SURGERY    . LEFT HEART CATH AND CORONARY ANGIOGRAPHY N/A 06/14/2017   Procedure: LEFT HEART CATH AND CORONARY ANGIOGRAPHY;  Surgeon: Runell Gess, MD;  Location: [facility];  Service: Cardiovascular;  Laterality: N/A;  . OOPHORECTOMY  2011   BSO  . TOTAL HIP ARTHROPLASTY Right 06/10/2017   Procedure: RIGHT TOTAL HIP ARTHROPLASTY ANTERIOR APPROACH;  Surgeon: Samson Frederic, MD;  Location: [facility];  Service: Orthopedics;  Laterality: Right;  Needs RNFA  . VAGINAL HYSTERECTOMY  2011   LAVH BSO; benign    FAMILY HISTORY Family History  Problem Relation Age of Onset  . Hypertension Mother   . Heart disease Mother   . Heart disease Father   . Heart disease Sister   . Colon cancer Paternal Aunt 47  . Breast cancer Paternal Aunt        Age 70's  . Diabetes Sister   . Endometrial cancer Sister   . Breast cancer Cousin        Maternal 1st cousins-Age 16's  . Leukemia Paternal Aunt     SOCIAL HISTORY Social History   Tobacco Use  . Smoking status: Never Smoker  . Smokeless tobacco: Never Used  Vaping Use  . Vaping Use: Never used  Substance Use Topics  . Alcohol use: No    Comment: Rare  . Drug use: No         OPHTHALMIC EXAM:  Base Eye Exam    Visual Acuity (Snellen - Linear)      Right Left   Dist [place] 20/50 20/25 -2   Dist ph [place] 20/25 -1 NI       Tonometry (Tonopen, 1:08 PM)      Right Left   Pressure 13 14       Pupils      Dark Light Shape React APD   Right 3 2 Round Brisk None   Left 3 2 Round Brisk None       Visual Fields (Counting fingers)      Left Right    Full Full       Extraocular Movement       Right Left    Full Full       Neuro/Psych    Oriented x3: Yes   Mood/Affect: Normal  Dilation    Both eyes: 1.0% Mydriacyl, 2.5% Phenylephrine @ 1:08 PM        Slit Lamp and Fundus Exam    Slit Lamp Exam      Right Left   Lids/Lashes mild Telangiectasia, marginal leasion nasal UL Telangiectasia, Meibomian gland dysfunction   Conjunctiva/Sclera White and quiet White and quiet   Cornea Arcus, 3+ Punctate epithelial erosions 3+ Punctate epithelial erosions, Arcus   Anterior Chamber Deep and clear Deep and clear   Iris Round and dilated Round and well dilated   Lens PC IOL in good position, trace Posterior capsular opacification (linear, extending just to visual axis) PC IOL in excellent position   Vitreous Vitreous syneresis, Posterior vitreous detachment Vitreous syneresis, Posterior vitreous detachment, vitreous condensations       Fundus Exam      Right Left   Disc Pink and Sharp, Compact Pink and Sharp, mild temporal PPA   C/D Ratio 0.2 0.3   Macula Blunted foveal reflex, Drusen, RPE mottling and clumping, early Atrophy, +PEDs--improved, stable improvement of central cyst / IRF, No heme Blunted foveal reflex, +drusen, pigment clumping, stable resolution of central edema, RPE mottling, clumping and early atrophy, no heme   Vessels Vascular attenuation Vascular attenuation   Periphery Attached, scattered reticular degeneration   Attached, scattered reticular degeneration          IMAGING AND PROCEDURES  Imaging and Procedures for @TODAY @  OCT, Retina - OU - Both Eyes       Right Eye Quality was borderline. Central Foveal Thickness: 240. Progression has been stable. Findings include retinal drusen , pigment epithelial detachment, outer retinal atrophy, intraretinal hyper-reflective material, no SRF, no IRF, abnormal foveal contour (Stable improvement in PED height).   Left Eye Quality was good. Central Foveal Thickness: 246. Progression has been stable. Findings  include retinal drusen , outer retinal atrophy, pigment epithelial detachment, intraretinal hyper-reflective material, normal foveal contour, no SRF, no IRF (Stable resolution of bullous PED and IRF/SRF -- now just central ORA).   Notes *Images captured and stored on drive  Diagnosis / Impression:  OD: exudative ARMD -- stable improvement in PED -- persistent ORA OS: exu-ARMD -- stable resolution of bullous PED and IRF/SRF -- now just central ORA   Clinical management:  See below  Abbreviations: NFP - Normal foveal profile. CME - cystoid macular edema. PED - pigment epithelial detachment. IRF - intraretinal fluid. SRF - subretinal fluid. EZ - ellipsoid zone. ERM - epiretinal membrane. ORA - outer retinal atrophy. ORT - outer retinal tubulation. SRHM - subretinal hyper-reflective material        Intravitreal Injection, Pharmacologic Agent - OD - Right Eye       Time Out 02/23/2020. 1:05 PM. Confirmed correct patient, procedure, site, and patient consented.   Anesthesia Topical anesthesia was used. Anesthetic medications included Lidocaine 2%, Proparacaine 0.5%.   Procedure Preparation included 5% betadine to ocular surface, eyelid speculum. A supplied needle was used.   Injection:  1.25 mg Bevacizumab (AVASTIN) SOLN   NDC: 16109-604-5450242-060-01, Lot: [id number] , Expiration date: 04/03/2020   Route: Intravitreal, Site: Right Eye, Waste: 0 mg  Post-op Post injection exam found visual acuity of at least counting fingers. The patient tolerated the procedure well. There were no complications. The patient received written and verbal post procedure care education.                 ASSESSMENT/PLAN:    ICD-10-CM   1. Exudative age-related macular degeneration of left eye  with active choroidal neovascularization (HCC)  H35.3221   2. Retinal edema  H35.81 OCT, Retina - OU - Both Eyes  3. Exudative age-related macular degeneration of right eye with active choroidal neovascularization (HCC)   H35.3211 Intravitreal Injection, Pharmacologic Agent - OD - Right Eye    Bevacizumab (AVASTIN) SOLN 1.25 mg  4. Essential hypertension  I10   5. Hypertensive retinopathy of both eyes  H35.033   6. Pseudophakia of both eyes  Z96.1   7. Dry eyes  H04.123     1,2. Exudative age-related macular degeneration, left eye.    - OCT 4.30.2020 showed interval conversion of OS from nonexudative ARMD to exu-ARMD with large dome-shaped PED with overlying IRF/SRF--stably improved  - s/p IVA OS #1 (04.30.20), #2 (05.28.20), #3 (06.26.20), #4 (08.03.20), #5 (09.11.20), #6 (10.19.20), #7 (11.23.20)  - today, stable resolution of PED w/ overlying IRF/SRF -- now just ORA  - BCVA 20/25  - benefits investigation for Eylea initiated 4.30.2020 -- approved for 2021  - FA 05.28.20 -- no active CNV OS, just staining  - discussed findings and excellent response to IVA  - recommend holding off on injection OS today - pt in agreement  - f/u in 9 wks -- DFE/OCT  3. Age related macular degeneration, exudative, OD  - interval development of IRF first noted on 09.11.20 -- conversion from nonexudative to exudative ARMD  - pt initially presented on 11.27.19 due to alert from [facility] home monitoring system for OD  - [facility] prescribed by Dr. Elmer Picker  - FA (5.28.2020) showed staining / window defect corresponding temporal RPE changes OU -- no active CNV OU  - S/P IVA OD #1 (09.11.20), #2 (10.19.20), #3 (11.23.20), #4 (01.07.21), #5 (2.19.21), #6 (04.02.21), #7 (05.28.21)  - benefits investigation for Eylea initiated 4.30.2020 -- approved for 2021  - OCT shows interval decrease in PED height, +ORA  - BCVA 20/25  - recommend IVA OD #8 today, 07.23.21 -- maintenance w/ f/u in 9 wks  - pt wishes to proceed  - RBA of procedure discussed, questions answered  - informed consent obtained  - see procedure note  - f/u 9 weeks -- DFE/OCT; possible injection; tx and ext as able  4,5. Hypertensive retinopathy OU  - discussed  importance of tight BP control  - monitor  6. Pseudophakia OU  - s/p CE/IOL OU  - beautiful surgeries, doing well  - monitor  7. Dry eyes OU - recommend artificial tears and lubricating ointment as needed    Ophthalmic Meds Ordered this visit:  Meds ordered this encounter  Medications  . Bevacizumab (AVASTIN) SOLN 1.25 mg       Return in about 9 weeks (around 04/26/2020) for f/u exu ARMD OU, DFE, OCT.  There are no Patient Instructions on file for this visit.   Explained the diagnoses, plan, and follow up with the patient and they expressed understanding.  Patient expressed understanding of the importance of proper follow up care.   This document serves as a record of services personally performed by Karie Chimera, MD, PhD. It was created on their behalf by Glee Arvin. Manson Passey, OA an ophthalmic technician. The creation of this record is the provider's dictation and/or activities during the visit.    Electronically signed by: Glee Arvin. Kristopher Oppenheim 07.19.2021 11:36 PM   Karie Chimera, M.D., Ph.D. Diseases & Surgery of the Retina and Vitreous [facility]  I have reviewed the above documentation for accuracy and completeness, and  I agree with the above. Karie Chimera, M.D., Ph.D. 02/25/20 11:36 PM   Abbreviations: M myopia (nearsighted); A astigmatism; H hyperopia (farsighted); P presbyopia; Mrx spectacle prescription;  CTL contact lenses; OD right eye; OS left eye; OU both eyes  XT exotropia; ET esotropia; PEK punctate epithelial keratitis; PEE punctate epithelial erosions; DES dry eye syndrome; MGD meibomian gland dysfunction; ATs artificial tears; PFAT's preservative free artificial tears; NSC nuclear sclerotic cataract; PSC posterior subcapsular cataract; ERM epi-retinal membrane; PVD posterior vitreous detachment; RD retinal detachment; DM diabetes mellitus; DR diabetic retinopathy; NPDR non-proliferative diabetic retinopathy; PDR proliferative diabetic  retinopathy; CSME clinically significant macular edema; DME diabetic macular edema; dbh dot blot hemorrhages; CWS cotton wool spot; POAG primary open angle glaucoma; C/D cup-to-disc ratio; HVF humphrey visual field; GVF goldmann visual field; OCT optical coherence tomography; IOP intraocular pressure; BRVO Branch retinal vein occlusion; CRVO central retinal vein occlusion; CRAO central retinal artery occlusion; BRAO branch retinal artery occlusion; RT retinal tear; SB scleral buckle; PPV pars plana vitrectomy; VH Vitreous hemorrhage; PRP panretinal laser photocoagulation; IVK intravitreal kenalog; VMT vitreomacular traction; MH Macular hole;  NVD neovascularization of the disc; NVE neovascularization elsewhere; AREDS age related eye disease study; ARMD age related macular degeneration; POAG primary open angle glaucoma; EBMD epithelial/anterior basement membrane dystrophy; ACIOL anterior chamber intraocular lens; IOL intraocular lens; PCIOL posterior chamber intraocular lens; Phaco/IOL phacoemulsification with intraocular lens placement; PRK photorefractive keratectomy; LASIK laser assisted in situ keratomileusis; HTN hypertension; DM diabetes mellitus; COPD chronic obstructive pulmonary disease

## 2020-02-23 ENCOUNTER — Other Ambulatory Visit: Payer: Self-pay

## 2020-02-23 ENCOUNTER — Ambulatory Visit (INDEPENDENT_AMBULATORY_CARE_PROVIDER_SITE_OTHER): Payer: Medicare Other | Admitting: Ophthalmology

## 2020-02-23 ENCOUNTER — Encounter (INDEPENDENT_AMBULATORY_CARE_PROVIDER_SITE_OTHER): Payer: Self-pay | Admitting: Ophthalmology

## 2020-02-23 DIAGNOSIS — H353211 Exudative age-related macular degeneration, right eye, with active choroidal neovascularization: Secondary | ICD-10-CM

## 2020-02-23 DIAGNOSIS — H353221 Exudative age-related macular degeneration, left eye, with active choroidal neovascularization: Secondary | ICD-10-CM

## 2020-02-23 DIAGNOSIS — Z961 Presence of intraocular lens: Secondary | ICD-10-CM

## 2020-02-23 DIAGNOSIS — H3581 Retinal edema: Secondary | ICD-10-CM

## 2020-02-23 DIAGNOSIS — H04123 Dry eye syndrome of bilateral lacrimal glands: Secondary | ICD-10-CM

## 2020-02-23 DIAGNOSIS — I10 Essential (primary) hypertension: Secondary | ICD-10-CM | POA: Diagnosis not present

## 2020-02-23 DIAGNOSIS — H35033 Hypertensive retinopathy, bilateral: Secondary | ICD-10-CM

## 2020-02-25 MED ORDER — BEVACIZUMAB CHEMO INJECTION 1.25MG/0.05ML SYRINGE FOR KALEIDOSCOPE
1.2500 mg | INTRAVITREAL | Status: AC | PRN
Start: 2020-02-25 — End: 2020-02-25
  Administered 2020-02-25: 1.25 mg via INTRAVITREAL

## 2020-03-04 ENCOUNTER — Other Ambulatory Visit: Payer: Self-pay | Admitting: Family Medicine

## 2020-03-04 ENCOUNTER — Other Ambulatory Visit: Payer: Self-pay

## 2020-03-04 ENCOUNTER — Other Ambulatory Visit: Payer: Medicare Other

## 2020-03-04 DIAGNOSIS — E039 Hypothyroidism, unspecified: Secondary | ICD-10-CM

## 2020-03-04 DIAGNOSIS — I2510 Atherosclerotic heart disease of native coronary artery without angina pectoris: Secondary | ICD-10-CM

## 2020-03-04 DIAGNOSIS — E785 Hyperlipidemia, unspecified: Secondary | ICD-10-CM

## 2020-03-05 LAB — COMPREHENSIVE METABOLIC PANEL
AG Ratio: 1.8 (calc) (ref 1.0–2.5)
ALT: 13 U/L (ref 6–29)
AST: 17 U/L (ref 10–35)
Albumin: 4 g/dL (ref 3.6–5.1)
Alkaline phosphatase (APISO): 76 U/L (ref 37–153)
BUN: 17 mg/dL (ref 7–25)
CO2: 32 mmol/L (ref 20–32)
Calcium: 9.2 mg/dL (ref 8.6–10.4)
Chloride: 106 mmol/L (ref 98–110)
Creat: 0.77 mg/dL (ref 0.60–0.88)
Globulin: 2.2 g/dL (calc) (ref 1.9–3.7)
Glucose, Bld: 96 mg/dL (ref 65–99)
Potassium: 4.1 mmol/L (ref 3.5–5.3)
Sodium: 142 mmol/L (ref 135–146)
Total Bilirubin: 0.4 mg/dL (ref 0.2–1.2)
Total Protein: 6.2 g/dL (ref 6.1–8.1)

## 2020-03-05 LAB — CBC WITH DIFFERENTIAL/PLATELET
Absolute Monocytes: 407 cells/uL (ref 200–950)
Basophils Absolute: 38 cells/uL (ref 0–200)
Basophils Relative: 1 %
Eosinophils Absolute: 118 cells/uL (ref 15–500)
Eosinophils Relative: 3.1 %
HCT: 41.7 % (ref 35.0–45.0)
Hemoglobin: 13.4 g/dL (ref 11.7–15.5)
Lymphs Abs: 923 cells/uL (ref 850–3900)
MCH: 30.2 pg (ref 27.0–33.0)
MCHC: 32.1 g/dL (ref 32.0–36.0)
MCV: 93.9 fL (ref 80.0–100.0)
MPV: 10.3 fL (ref 7.5–12.5)
Monocytes Relative: 10.7 %
Neutro Abs: 2314 cells/uL (ref 1500–7800)
Neutrophils Relative %: 60.9 %
Platelets: 252 10*3/uL (ref 140–400)
RBC: 4.44 10*6/uL (ref 3.80–5.10)
RDW: 12.3 % (ref 11.0–15.0)
Total Lymphocyte: 24.3 %
WBC: 3.8 10*3/uL (ref 3.8–10.8)

## 2020-03-05 LAB — TSH: TSH: 2.72 mIU/L (ref 0.40–4.50)

## 2020-03-05 LAB — LIPID PANEL
Cholesterol: 157 mg/dL (ref <200)
HDL: 63 mg/dL (ref >=50)
LDL Cholesterol (Calc): 72 mg/dL (calc)
Non-HDL Cholesterol (Calc): 94 mg/dL (calc) (ref <130)
Total CHOL/HDL Ratio: 2.5 (calc) (ref <5.0)
Triglycerides: 140 mg/dL (ref <150)

## 2020-03-11 ENCOUNTER — Encounter: Payer: Self-pay | Admitting: Family Medicine

## 2020-03-11 ENCOUNTER — Ambulatory Visit: Payer: Medicare Other | Admitting: Family Medicine

## 2020-03-11 ENCOUNTER — Other Ambulatory Visit: Payer: Self-pay

## 2020-03-11 VITALS — BP 120/60 | HR 70 | Temp 98.0°F | Ht 63.0 in | Wt 129.7 lb

## 2020-03-11 DIAGNOSIS — I10 Essential (primary) hypertension: Secondary | ICD-10-CM

## 2020-03-11 DIAGNOSIS — I2510 Atherosclerotic heart disease of native coronary artery without angina pectoris: Secondary | ICD-10-CM | POA: Diagnosis not present

## 2020-03-11 DIAGNOSIS — E039 Hypothyroidism, unspecified: Secondary | ICD-10-CM | POA: Diagnosis not present

## 2020-03-11 DIAGNOSIS — E785 Hyperlipidemia, unspecified: Secondary | ICD-10-CM

## 2020-03-11 DIAGNOSIS — R7309 Other abnormal glucose: Secondary | ICD-10-CM

## 2020-03-11 DIAGNOSIS — H353211 Exudative age-related macular degeneration, right eye, with active choroidal neovascularization: Secondary | ICD-10-CM | POA: Diagnosis not present

## 2020-03-11 MED ORDER — ISOSORBIDE MONONITRATE ER 60 MG PO TB24: 60.0000 mg | ORAL_TABLET | Freq: Every day | ORAL | 1 refills | Status: DC

## 2020-03-11 MED ORDER — LEVOTHYROXINE SODIUM 50 MCG PO TABS: ORAL_TABLET | ORAL | 1 refills | Status: DC

## 2020-03-11 NOTE — Progress Notes (Signed)
Kaitlyn Parks DOB: 05-24-1935 Encounter date: 03/11/2020  This is a 85 y.o. female who presents with Chief Complaint  Patient presents with  . Follow-up  . Breast Problem    patient requests breast exam due to her friend being diagnosed with breast cancer, denies any problems today    History of present illness: She hasn't felt or seen anything in breasts that she is concerned about, but just worried about breast cancer with friend being diagnosed.   Doing water exercise with outdoor pool. Tries to keep up with back and leg exercises to help with sciatic issues. Maybe takes aleve once weekly - takes with food if needed.   Last visit was telephone visit in February/2021.  At that time we discussed back pain: this has been doing better since she started water exercise and also had spinal injection through [facility]. Has follow up in September.   We discussed difficulty with sleeping: doing better as well. Using "calm" app. These are soothing and help with sleep.   We discussed stool incontinence, but it seemed dietary associated: seems to be associated with softer foods. Usually can feel this and be on guard when it occurs.   HTN: took the hydralazine 10mg  this morning. Taking it BID. Was getting some highs off of medications. Taking the imdur 60mg  daily.   HL: crestor 5mg  daily.   Hypothyroid: synthroid daily  Allergies  Allergen Reactions  . Acyclovir And Related Other (See Comments)    unknown  . Pravastatin Sodium Other (See Comments)    cystitis  . Zocor [Simvastatin] Other (See Comments)    cystitis   Current Meds  Medication Sig  . aspirin EC 81 MG tablet Take 81 mg by mouth daily.  . Biotin w/ Vitamins C & E (HAIR/SKIN/NAILS PO) Take 1 tablet by mouth daily.  . hydrALAZINE (APRESOLINE) 10 MG tablet Take 1 tablet (10 mg total) by mouth 2 (two) times daily.  . isosorbide mononitrate (IMDUR) 60 MG 24 hr tablet TAKE 1 TABLET ONCE DAILY.  . Multiple  Vitamins-Calcium (ONE-A-DAY WOMENS PO) Take 1 tablet by mouth daily.   . Multiple Vitamins-Minerals (PRESERVISION AREDS 2 PO) Take 1 capsule by mouth 2 (two) times daily.  . nitroGLYCERIN (NITROSTAT) 0.4 MG SL tablet Place 1 tablet (0.4 mg total) under the tongue every 5 (five) minutes as needed for chest pain.  PRESCRIPTION MEDICATION Avastin eye injections given by Dr due to macular degeneration  . rosuvastatin (CRESTOR) 10 MG tablet TAKE (1/2) TABLET DAILY.  . SYNTHROID 50 MCG tablet TAKE 1 TABLET EACH DAY.  . [DISCONTINUED] hydrALAZINE (APRESOLINE) 25 MG tablet Take 25 mg by mouth in the morning and at bedtime.  . [DISCONTINUED] lidocaine (XYLOCAINE) 5 % ointment Apply sparingly up to TID for pain    Review of Systems  Constitutional: Negative for chills, fatigue and fever.  Respiratory: Negative for cough, chest tightness, shortness of breath and wheezing.   Cardiovascular: Negative for chest pain, palpitations and leg swelling.  Musculoskeletal: Positive for back pain.    Objective:  BP 120/60 (BP Location: Left Arm, Patient Position: Sitting, Cuff Size: Normal)   Pulse 70   Temp 98 F (36.7 C) (Oral)   Ht 5\' 3"  (1.6 m)   Wt 129 lb 11.2 oz (58.8 kg)   BMI 22.98 kg/m   Weight: 129 lb 11.2 oz (58.8 kg)   BP Readings from Last 3 Encounters:  03/11/20 120/60  12/07/19 125/82  05/10/19 (!) 148/62  Wt Readings from Last 3 Encounters:  03/11/20 129 lb 11.2 oz (58.8 kg)  12/07/19 130 lb 3.2 oz (59.1 kg)  09/05/19 129 lb (58.5 kg)    Physical Exam Constitutional:      General: She is not in acute distress.    Appearance: She is well-developed.  Cardiovascular:     Rate and Rhythm: Normal rate and regular rhythm.     Heart sounds: Murmur heard.  Systolic murmur is present with a grade of 2/6.  No friction rub.  Pulmonary:     Effort: Pulmonary effort is normal. No respiratory distress.     Breath sounds: Normal breath sounds. No wheezing or rales.  Chest:      Breasts: Breasts are symmetrical.        Right: Normal.        Left: Normal.  Musculoskeletal:     Right lower leg: No edema.     Left lower leg: No edema.  Lymphadenopathy:     Upper Body:     Right upper body: No supraclavicular, axillary or pectoral adenopathy.     Left upper body: No supraclavicular, axillary or pectoral adenopathy.  Neurological:     Mental Status: She is alert and oriented to person, place, and time.  Psychiatric:        Behavior: Behavior normal.     Assessment/Plan  1. Coronary artery disease involving native heart without angina pectoris, unspecified vessel or lesion type controlled - CBC with Differential/Platelet; Future  2. Hypothyroidism (acquired) Well controlled. Continue current synthroid dose.  3. Dyslipidemia Continue with crestor.  - Lipid panel; Future  4. Exudative age-related macular degeneration of right eye with active choroidal neovascularization (HCC) Follows with specialist regularly.   5. Hypertension, unspecified type Well controlled. Continue current medications.  - Comprehensive metabolic panel; Future  6. Elevated glucose - Hemoglobin A1c; Future   Return in about 6 months (around 09/11/2020) for physical exam.    Theodis Shove, MD

## 2020-04-22 NOTE — Progress Notes (Signed)
[facility] - Clinic Note  04/26/2020     CHIEF COMPLAINT Patient presents for Retina Follow Up   HISTORY OF PRESENT ILLNESS: Kaitlyn Parks is a 85 y.o. female who presents to the clinic today for:   HPI    Retina Follow Up    Patient presents with  Wet AMD.  In both eyes.  This started months ago.  Severity is moderate.  Duration of 9 weeks.  Since onset it is stable.  I, the attending physician,  performed the HPI with the patient and updated documentation appropriately.          Comments    85 y/o female pt here for 9 wk f/u for exu ARMD OU.  No change in [place] OU.  Denies pain, FOL, floaters.  Soothe QHS OU.  AT prn OU.       Last edited by Rennis Chris, MD on 04/26/2020  2:02 PM. (History)    pt states    Referring physician: Demarco, [place], OD [address] [place],  [place] [address]  HISTORICAL INFORMATION:   Selected notes from the MEDICAL RECORD NUMBER Referred by Dr. [place] DeMarco for concern of exu ARMD LEE: 11.26.19 (J. 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 (Ophthalmic Drugs)   No current facility-administered medications for this visit. (Ophthalmic Drugs)   Current Outpatient Medications (Other)  Medication Sig   aspirin EC 81 MG tablet Take 81 mg by mouth daily.   Biotin w/ Vitamins C & E (HAIR/SKIN/NAILS PO) Take 1 tablet by mouth daily.   hydrALAZINE (APRESOLINE) 10 MG tablet Take 1 tablet (10 mg total) by mouth 2 (two) times daily.   isosorbide mononitrate (IMDUR) 60 MG 24 hr tablet Take 1 tablet (60 mg total) by mouth daily.   levothyroxine (SYNTHROID) 50 MCG tablet TAKE 1 TABLET EACH DAY.   Multiple Vitamins-Calcium (ONE-A-DAY WOMENS PO) Take 1 tablet by mouth daily.    Multiple Vitamins-Minerals (PRESERVISION AREDS 2 PO) Take 1 capsule by mouth 2  (two) times daily.   nitroGLYCERIN (NITROSTAT) 0.4 MG SL tablet Place 1 tablet (0.4 mg total) under the tongue every 5 (five) minutes as needed for chest pain.   PRESCRIPTION MEDICATION Avastin eye injections given by Dr Vanessa Barbara due to macular degeneration   rosuvastatin (CRESTOR) 10 MG tablet TAKE (1/2) TABLET DAILY.   No current facility-administered medications for this visit. (Other)      REVIEW OF SYSTEMS: ROS    Positive for: Gastrointestinal, Neurological, Musculoskeletal, Cardiovascular, Eyes   Negative for: Constitutional, Skin, Genitourinary, HENT, Endocrine, Respiratory, Psychiatric, Allergic/Imm, Heme/Lymph   Last edited by Celine Mans, COA on 04/26/2020  1:14 PM. (History)       ALLERGIES Allergies  Allergen Reactions   Acyclovir And Related Other (See Comments)    unknown   Pravastatin Sodium Other (See Comments)    cystitis   Zocor [Simvastatin] Other (See Comments)    cystitis    PAST MEDICAL HISTORY Past Medical History:  Diagnosis Date   Arthritis    DDD, scoliosis, sees Dr. Lovell Sheehan for this, uses norco very rarely for pain   Bradycardia 11/11/2017   CAD (coronary artery disease)    LAD stenting of a 90% lesion 2012   Cystocele    Educated about COVID-19 virus infection 12/06/2019   Elevated cholesterol    GERD (gastroesophageal reflux  disease)    dx in work up 2016 for atypical CP at OSH   pt. denies   Heart murmur    Hypertensive retinopathy    OU   Hypothyroidism    Interstitial cystitis    sees Dr. [name]   Macular degeneration    OU   NSTEMI (non-ST elevated myocardial infarction) [facility])    Osteoporosis    Rectocele    S/P hip replacement, right 06/12/2017   Scoliosis    Thyroid disease    Hypothyroid   Urinary incontinence    USI   Uterine prolapse    Past Surgical History:  Procedure Laterality Date   BLADDER SUSPENSION  2011   CATARACT EXTRACTION Bilateral 2015   Dr. Elmer Picker   CORONARY ANGIOPLASTY  WITH STENT PLACEMENT  04/21/2010   LAD 80%, RCA 30%, nl EF, s/p DES LAD   EYE SURGERY     LEFT HEART CATH AND CORONARY ANGIOGRAPHY N/A 06/14/2017   Procedure: LEFT HEART CATH AND CORONARY ANGIOGRAPHY;  Surgeon: Runell Gess, MD;  Location: [facility];  Service: Cardiovascular;  Laterality: N/A;   OOPHORECTOMY  2011   BSO   TOTAL HIP ARTHROPLASTY Right 06/10/2017   Procedure: RIGHT TOTAL HIP ARTHROPLASTY ANTERIOR APPROACH;  Surgeon: Samson Frederic, MD;  Location: [facility];  Service: Orthopedics;  Laterality: Right;  Needs RNFA   VAGINAL HYSTERECTOMY  2011   LAVH BSO; benign    FAMILY HISTORY Family History  Problem Relation Age of Onset   Hypertension Mother    Heart disease Mother    Heart disease Father    Heart disease Sister    Colon cancer Paternal Aunt 55   Breast cancer Paternal Aunt        Age 59's   Diabetes Sister    Endometrial cancer Sister    Breast cancer Cousin        Maternal 1st cousins-Age 79's   Leukemia Paternal Aunt     SOCIAL HISTORY Social History   Tobacco Use   Smoking status: Never Smoker   Smokeless tobacco: Never Used  Building services engineer Use: Never used  Substance Use Topics   Alcohol use: No    Comment: Rare   Drug use: No         OPHTHALMIC EXAM:  Base Eye Exam    Visual Acuity (Snellen - Linear)      Right Left   Dist [place] 20/40 + 20/25 +   Dist ph [place] 20/30 -2 NI       Tonometry (Tonopen, 1:18 PM)      Right Left   Pressure 10 12       Pupils      Dark Light Shape React APD   Right 3 2 Round Brisk None   Left 3 2 Round Brisk None       Visual Fields (Counting fingers)      Left Right    Full Full       Extraocular Movement      Right Left    Full, Ortho Full, Ortho       Neuro/Psych    Oriented x3: Yes   Mood/Affect: Normal       Dilation    Both eyes: 1.0% Mydriacyl, 2.5% Phenylephrine @ 1:21 PM        Slit Lamp and Fundus Exam    Slit Lamp Exam      Right Left    Lids/Lashes mild Telangiectasia, marginal leasion nasal  UL Telangiectasia, Meibomian gland dysfunction   Conjunctiva/Sclera White and quiet White and quiet   Cornea Arcus, 3+ Punctate epithelial erosions 3+ Punctate epithelial erosions, Arcus   Anterior Chamber Deep and clear Deep and clear   Iris Round and dilated Round and well dilated   Lens PC IOL in good position, trace Posterior capsular opacification (linear, extending just to visual axis) PC IOL in excellent position   Vitreous Vitreous syneresis, Posterior vitreous detachment Vitreous syneresis, Posterior vitreous detachment, vitreous condensations       Fundus Exam      Right Left   Disc Pink and Sharp, Compact Pink and Sharp, mild temporal PPA   C/D Ratio 0.2 0.3   Macula Blunted foveal reflex, Drusen, RPE mottling and clumping, early Atrophy, +PEDs--stably improved, stable improvement of central cyst / IRF, No heme Blunted foveal reflex, +drusen, pigment clumping, stable resolution of central edema, RPE mottling, clumping and early atrophy, no heme   Vessels Vascular attenuation Vascular attenuation   Periphery Attached, scattered reticular degeneration   Attached, scattered reticular degeneration          IMAGING AND PROCEDURES  Imaging and Procedures for @TODAY @  OCT, Retina - OU - Both Eyes       Right Eye Quality was good. Central Foveal Thickness: 235. Progression has been stable. Findings include retinal drusen , pigment epithelial detachment, outer retinal atrophy, intraretinal hyper-reflective material, no SRF, no IRF, abnormal foveal contour (Stable improvement in PED; trace cystic changes; persistent ORA and IRHM).   Left Eye Quality was good. Central Foveal Thickness: 243. Progression has been stable. Findings include retinal drusen , outer retinal atrophy, pigment epithelial detachment, intraretinal hyper-reflective material, normal foveal contour, no SRF, no IRF (Stable resolution of bullous PED and IRF/SRF --  now just central ORA; trace cystic changes).   Notes *Images captured and stored on drive  Diagnosis / Impression:  OD: exudative ARMD -- Stable improvement in PED; trace cystic changes; persistent ORA and IRHM OS: exu-ARMD -- stable resolution of bullous PED and IRF/SRF -- now just central ORA; trace cystic changes   Clinical management:  See below  Abbreviations: NFP - Normal foveal profile. CME - cystoid macular edema. PED - pigment epithelial detachment. IRF - intraretinal fluid. SRF - subretinal fluid. EZ - ellipsoid zone. ERM - epiretinal membrane. ORA - outer retinal atrophy. ORT - outer retinal tubulation. SRHM - subretinal hyper-reflective material        Intravitreal Injection, Pharmacologic Agent - OD - Right Eye       Time Out 04/26/2020. 2:03 PM. Confirmed correct patient, procedure, site, and patient consented.   Anesthesia Topical anesthesia was used. Anesthetic medications included Lidocaine 2%, Proparacaine 0.5%.   Procedure Preparation included 5% betadine to ocular surface, eyelid speculum. A (32g) needle was used.   Injection:  1.25 mg Bevacizumab (AVASTIN) SOLN   NDC: 70360-001-02, [id number], Expiration date: 06/09/2020   Route: Intravitreal, Site: Right Eye, Waste: 0.05 mL  Post-op Post injection exam found visual acuity of at least counting fingers. The patient tolerated the procedure well. There were no complications. The patient received written and verbal post procedure care education. Post injection medications were not given.                 ASSESSMENT/PLAN:    ICD-10-CM   1. Exudative age-related macular degeneration of left eye with active choroidal neovascularization (HCC)  H35.3221   2. Retinal edema  H35.81 OCT, Retina - OU - Both Eyes  3.  Exudative age-related macular degeneration of right eye with active choroidal neovascularization (HCC)  H35.3211 Intravitreal Injection, Pharmacologic Agent - OD - Right Eye    Bevacizumab  (AVASTIN) SOLN 1.25 mg  4. Essential hypertension  I10   5. Hypertensive retinopathy of both eyes  H35.033   6. Pseudophakia of both eyes  Z96.1   7. Dry eyes  H04.123     1,2. Exudative age-related macular degeneration, left eye.    - OCT 4.30.2020 showed interval conversion of OS from nonexudative ARMD to exu-ARMD with large dome-shaped PED with overlying IRF/SRF--stably improved  - s/p IVA OS #1 (04.30.20), #2 (05.28.20), #3 (06.26.20), #4 (08.03.20), #5 (09.11.20), #6 (10.19.20), #7 (11.23.20)  - today, stable resolution of PED w/ overlying IRF/SRF -- now just ORA  - BCVA 20/25  - benefits investigation for Eylea initiated 4.30.2020 -- approved for 2021  - FA 05.28.20 -- no active CNV OS, just staining  - recommend holding off on injection OS today - pt in agreement  - f/u in 9 wks -- DFE/OCT  3. Age related macular degeneration, exudative, OD  - interval development of IRF first noted on 09.11.20 -- conversion from nonexudative to exudative ARMD  - pt initially presented on 11.27.19 due to alert from [facility] home monitoring system for OD  - [facility] prescribed by Dr. Elmer PickerHecker  - FA (5.28.2020) showed staining / window defect corresponding temporal RPE changes OU -- no active CNV OU  - S/P IVA OD #1 (09.11.20), #2 (10.19.20), #3 (11.23.20), #4 (01.07.21), #5 (2.19.21), #6 (04.02.21), #7 (05.28.21), #8 (07.23.21)  - benefits investigation for Eylea initiated 4.30.2020 -- approved for 2021  - OCT shows interval decrease in PED height, +ORA  - BCVA 20/25  - recommend IVA OD #9 today, 09.24.21 -- maintenance w/ f/u in 9 wks  - pt wishes to proceed  - RBA of procedure discussed, questions answered  - informed consent obtained  - see procedure note  - f/u 9 weeks -- DFE/OCT; possible injection; tx and ext as able  4,5. Hypertensive retinopathy OU  - discussed importance of tight BP control  - monitor  6. Pseudophakia OU  - s/p CE/IOL OU  - beautiful surgeries, doing well  -  monitor  7. Dry eyes OU - recommend artificial tears and lubricating ointment as needed    Ophthalmic Meds Ordered this visit:  Meds ordered this encounter  Medications   Bevacizumab (AVASTIN) SOLN 1.25 mg       Return in about 9 weeks (around 06/28/2020) for Dilated Exam, OCT, Possible Injxn.  There are no Patient Instructions on file for this visit.   Explained the diagnoses, plan, and follow up with the patient and they expressed understanding.  Patient expressed understanding of the importance of proper follow up care.   This document serves as a record of services personally performed by Karie ChimeraBrian G. Sabas Frett, MD, PhD. It was created on their behalf by Glee ArvinAmanda J. Manson PasseyBrown, OA an ophthalmic technician. The creation of this record is the provider's dictation and/or activities during the visit.    Electronically signed by: Glee ArvinAmanda J. Manson PasseyBrown, [place] 09.20.2021 1:07 AM   Karie ChimeraBrian G. Mozetta Murfin, M.D., Ph.D. Diseases & Surgery of the Retina and Vitreous [facility]  I have reviewed the above documentation for accuracy and completeness, and I agree with the above. Karie ChimeraBrian G. Elishia Kaczorowski, M.D., Ph.D. 04/28/20 1:07 AM   Abbreviations: M myopia (nearsighted); A astigmatism; H hyperopia (farsighted); P presbyopia; Mrx spectacle prescription;  CTL  contact lenses; OD right eye; OS left eye; OU both eyes  XT exotropia; ET esotropia; PEK punctate epithelial keratitis; PEE punctate epithelial erosions; DES dry eye syndrome; MGD meibomian gland dysfunction; ATs artificial tears; PFAT's preservative free artificial tears; NSC nuclear sclerotic cataract; PSC posterior subcapsular cataract; ERM epi-retinal membrane; PVD posterior vitreous detachment; RD retinal detachment; DM diabetes mellitus; DR diabetic retinopathy; NPDR non-proliferative diabetic retinopathy; PDR proliferative diabetic retinopathy; CSME clinically significant macular edema; DME diabetic macular edema; dbh dot blot hemorrhages; CWS  cotton wool spot; POAG primary open angle glaucoma; C/D cup-to-disc ratio; HVF humphrey visual field; GVF goldmann visual field; OCT optical coherence tomography; IOP intraocular pressure; BRVO Branch retinal vein occlusion; CRVO central retinal vein occlusion; CRAO central retinal artery occlusion; BRAO branch retinal artery occlusion; RT retinal tear; SB scleral buckle; PPV pars plana vitrectomy; VH Vitreous hemorrhage; PRP panretinal laser photocoagulation; IVK intravitreal kenalog; VMT vitreomacular traction; MH Macular hole;  NVD neovascularization of the disc; NVE neovascularization elsewhere; AREDS age related eye disease study; ARMD age related macular degeneration; POAG primary open angle glaucoma; EBMD epithelial/anterior basement membrane dystrophy; ACIOL anterior chamber intraocular lens; IOL intraocular lens; PCIOL posterior chamber intraocular lens; Phaco/IOL phacoemulsification with intraocular lens placement; PRK photorefractive keratectomy; LASIK laser assisted in situ keratomileusis; HTN hypertension; DM diabetes mellitus; COPD chronic obstructive pulmonary disease

## 2020-04-26 ENCOUNTER — Ambulatory Visit (INDEPENDENT_AMBULATORY_CARE_PROVIDER_SITE_OTHER): Payer: Medicare Other | Admitting: Ophthalmology

## 2020-04-26 ENCOUNTER — Other Ambulatory Visit: Payer: Self-pay

## 2020-04-26 ENCOUNTER — Encounter (INDEPENDENT_AMBULATORY_CARE_PROVIDER_SITE_OTHER): Payer: Self-pay | Admitting: Ophthalmology

## 2020-04-26 DIAGNOSIS — I10 Essential (primary) hypertension: Secondary | ICD-10-CM

## 2020-04-26 DIAGNOSIS — H35033 Hypertensive retinopathy, bilateral: Secondary | ICD-10-CM

## 2020-04-26 DIAGNOSIS — H353211 Exudative age-related macular degeneration, right eye, with active choroidal neovascularization: Secondary | ICD-10-CM | POA: Diagnosis not present

## 2020-04-26 DIAGNOSIS — H04123 Dry eye syndrome of bilateral lacrimal glands: Secondary | ICD-10-CM

## 2020-04-26 DIAGNOSIS — H3581 Retinal edema: Secondary | ICD-10-CM

## 2020-04-26 DIAGNOSIS — H353221 Exudative age-related macular degeneration, left eye, with active choroidal neovascularization: Secondary | ICD-10-CM

## 2020-04-26 DIAGNOSIS — Z961 Presence of intraocular lens: Secondary | ICD-10-CM

## 2020-04-26 MED ORDER — BEVACIZUMAB CHEMO INJECTION 1.25MG/0.05ML SYRINGE FOR KALEIDOSCOPE
1.2500 mg | INTRAVITREAL | Status: AC | PRN
  Administered 2020-04-26: 1.25 mg via INTRAVITREAL

## 2020-05-14 ENCOUNTER — Telehealth (INDEPENDENT_AMBULATORY_CARE_PROVIDER_SITE_OTHER): Payer: Medicare Other | Admitting: Family Medicine

## 2020-05-14 ENCOUNTER — Encounter: Payer: Self-pay | Admitting: Family Medicine

## 2020-05-14 DIAGNOSIS — R197 Diarrhea, unspecified: Secondary | ICD-10-CM

## 2020-05-14 NOTE — Progress Notes (Signed)
Virtual Visit via Video Note  I connected with Kaitlyn Parks  on 05/14/20 at  1:20 PM EDT by a video enabled telemedicine application and verified that I am speaking with the correct person using two identifiers.  Location patient: home, [place] Location provider:work or home office Persons participating in the virtual visit: patient, provider  I discussed the limitations of evaluation and management by telemedicine and the availability of in person appointments. The patient expressed understanding and agreed to proceed.   HPI:  Acute telemedicine visit for diarrhea : -Onset: about 1-2 days after eating a chicken biscuit at a church function around Oct 2nd -Symptoms include: diarrhea once initially then it resolved, however when she ate a crab cake with spinach and cheese a few days ago she had diarrhea again -no diarrhea the last few days, but has some stomach cramps on and off and a little decreased appetite the last few days -is drinking plenty of fluids -Denies: fevers, vomiting, blood in the stools -COVID-19 vaccine status: fully vaccinated, had booster about 1 week ago  ROS: See pertinent positives and negatives per HPI.  Past Medical History:  Diagnosis Date  . Arthritis    DDD, scoliosis, sees Dr. Lovell Sheehan for this, uses norco very rarely for pain  . Bradycardia 11/11/2017  . CAD (coronary artery disease)    LAD stenting of a 90% lesion 2012  . Cystocele   . Educated about COVID-19 virus infection 12/06/2019  . Elevated cholesterol   . GERD (gastroesophageal reflux disease)    dx in work up 2016 for atypical CP at OSH   pt. denies  . Heart murmur   . Hypertensive retinopathy    OU  . Hypothyroidism   . Interstitial cystitis    sees Dr. [name]  . Macular degeneration    OU  . NSTEMI (non-ST elevated myocardial infarction) (HCC)   . Osteoporosis   . Rectocele   . 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 Scoliosis   . Thyroid disease    Hypothyroid  . Urinary incontinence    USI   . Uterine prolapse     Past Surgical History:  Procedure Laterality Date  . BLADDER SUSPENSION  2011  . CATARACT EXTRACTION Bilateral 2015   Dr. Elmer Picker  . CORONARY ANGIOPLASTY WITH STENT PLACEMENT  04/21/2010   LAD 80%, RCA 30%, nl EF, s/p DES LAD  . EYE SURGERY    . LEFT HEART CATH AND CORONARY ANGIOGRAPHY N/A 06/14/2017   Procedure: LEFT HEART CATH AND CORONARY ANGIOGRAPHY;  Surgeon: Runell Gess, MD;  Location: [facility];  Service: Cardiovascular;  Laterality: N/A;  . OOPHORECTOMY  2011   BSO  . TOTAL HIP ARTHROPLASTY Right 06/10/2017   Procedure: RIGHT TOTAL HIP ARTHROPLASTY ANTERIOR APPROACH;  Surgeon: Samson Frederic, MD;  Location: [facility];  Service: Orthopedics;  Laterality: Right;  Needs RNFA  . VAGINAL HYSTERECTOMY  2011   LAVH BSO; benign     Current Outpatient Medications:  .  aspirin EC 81 MG tablet, Take 81 mg by mouth daily., Disp: , Rfl:  .  Biotin w/ Vitamins C & E (HAIR/SKIN/NAILS PO), Take 1 tablet by mouth daily., Disp: , Rfl:  .  hydrALAZINE (APRESOLINE) 10 MG tablet, Take 1 tablet (10 mg total) by mouth 2 (two) times daily., Disp: 180 tablet, Rfl: 3 .  isosorbide mononitrate (IMDUR) 60 MG 24 hr tablet, Take 1 tablet (60 mg total) by mouth daily., Disp: 90 tablet, Rfl: 1 .  levothyroxine (SYNTHROID) 50  MCG tablet, TAKE 1 TABLET EACH DAY., Disp: 90 tablet, Rfl: 1 .  Multiple Vitamins-Calcium (ONE-A-DAY WOMENS PO), Take 1 tablet by mouth daily. , Disp: , Rfl:  .  Multiple Vitamins-Minerals (PRESERVISION AREDS 2 PO), Take 1 capsule by mouth 2 (two) times daily., Disp: , Rfl:  .  nitroGLYCERIN (NITROSTAT) 0.4 MG SL tablet, Place 1 tablet (0.4 mg total) under the tongue every 5 (five) minutes as needed for chest pain., Disp: 25 tablet, Rfl: 8 .  PRESCRIPTION MEDICATION, Avastin eye injections given by Dr Vanessa Barbara due to macular degeneration, Disp: , Rfl:  .  rosuvastatin (CRESTOR) 10 MG tablet, TAKE (1/2) TABLET DAILY., Disp: 45 tablet, Rfl:  3  EXAM:  VITALS per patient if applicable:  GENERAL: alert, oriented, appears well and in no acute distress  HEENT: atraumatic, conjunttiva clear, no obvious abnormalities on inspection of external nose and ears  NECK: normal movements of the head and neck  LUNGS: on inspection no signs of respiratory distress, breathing rate appears normal, no obvious gross SOB, gasping or wheezing  CV: no obvious cyanosis  MS: moves all visible extremities without noticeable abnormality  PSYCH/NEURO: pleasant and cooperative, no obvious depression or anxiety, speech and thought processing grossly intact  ASSESSMENT AND PLAN:  Discussed the following assessment and plan:  Diarrhea, unspecified type  -we discussed possible serious and likely etiologies, options for evaluation and workup, limitations of telemedicine visit vs in person visit, treatment, treatment risks and precautions. Pt prefers to treat via telemedicine empirically rather than in person at this moment.  Discussed potential etiologies and query viral gastroenteritis versus bowel imbalance versus other.  Since her symptoms seem to be improving, she has opted to try avoiding dairy and red meat for 1 to 2 weeks, Imodium if any recurrent diarrhea and a probiotic.  Doubt COVID-19, though did discuss that this can present sometimes with GI symptoms.  She is triple vaccinated.  Discussed testing options.  She agrees to follow-up if any worsening or if symptoms are not resolving as expected. Scheduled follow up with PCP offered: Agrees to call to schedule a follow-up visit if needed. Advised to seek prompt follow up telemedicine visit or in person care if worsening, new symptoms arise, or if is not improving with treatment. Did let this patient know that I only do telemedicine on Tuesdays and Thursdays for [place]. Advised to schedule follow up visit with PCP or [facility] if any further questions or concerns to avoid delays in care.   I discussed the  assessment and treatment plan with the patient. The patient was provided an opportunity to ask questions and all were answered. The patient agreed with the plan and demonstrated an understanding of the instructions.     Terressa Koyanagi, DO  Kathie Rhodes B reviewed all I am doing okay how are you

## 2020-05-14 NOTE — Patient Instructions (Signed)
Please drink plenty of fluids, water is best if you are able to eat.  If you are unable to eat then soup broth or a beverage with electrolytes is best.  However, try to avoid artificial sweeteners, sugar and food coloring.  Eat a bland diet and avoid dairy and red meat for 1 to 2 weeks.  Can take a probiotic once daily -I like Culturelle and align.  I hope you are feeling better soon! Seek care promptly if your symptoms worsen, new concerns arise or you are not improving with treatment.

## 2020-05-20 ENCOUNTER — Other Ambulatory Visit: Payer: Self-pay

## 2020-05-20 ENCOUNTER — Encounter: Payer: Self-pay | Admitting: Cardiology

## 2020-05-20 ENCOUNTER — Ambulatory Visit: Payer: Medicare Other | Admitting: Physician Assistant

## 2020-05-20 ENCOUNTER — Encounter: Payer: Self-pay | Admitting: Physician Assistant

## 2020-05-20 VITALS — BP 179/79 | HR 63 | Ht 63.0 in | Wt 128.6 lb

## 2020-05-20 DIAGNOSIS — I2510 Atherosclerotic heart disease of native coronary artery without angina pectoris: Secondary | ICD-10-CM | POA: Diagnosis not present

## 2020-05-20 DIAGNOSIS — I10 Essential (primary) hypertension: Secondary | ICD-10-CM | POA: Diagnosis not present

## 2020-05-20 DIAGNOSIS — E785 Hyperlipidemia, unspecified: Secondary | ICD-10-CM

## 2020-05-20 DIAGNOSIS — E039 Hypothyroidism, unspecified: Secondary | ICD-10-CM

## 2020-05-20 DIAGNOSIS — R0789 Other chest pain: Secondary | ICD-10-CM | POA: Diagnosis not present

## 2020-05-20 DIAGNOSIS — R42 Dizziness and giddiness: Secondary | ICD-10-CM | POA: Diagnosis not present

## 2020-05-20 MED ORDER — ISOSORBIDE MONONITRATE ER 60 MG PO TB24: 90.0000 mg | ORAL_TABLET | Freq: Every day | ORAL | 0 refills | Status: DC

## 2020-05-20 NOTE — Progress Notes (Addendum)
Cardiology Office Note:    Date:  05/22/2020   ID:  Kaitlyn Parks, DOB May 04, 1935, MRN [id number]  PCP:  Wynn Banker, MD  [facility] Cardiologist:  Rollene Rotunda, MD  [facility] Electrophysiologist:  None   Referring MD: Wynn Banker, MD   Chief Complaint  Patient presents with   Follow-up    seen for Dr. Antoine Poche    History of Present Illness:    Kaitlyn Parks is a 85 y.o. female with a hx of hypothyroidism, HTN, HLD and CAD. She had a PCI of LAD in 2012. CTA of the chest in Nov 2018 was negative for PE however troponin was elevated at 0.42. Echocardiogram obtained on 06/13/2017 showed EF 60-65%, mild LVH, grade 1 DD, mild MR, mild TR, peak PA pressure 30 mmHg. Cardiac catheterization performed on 06/14/2017 showed 95% ostial D1 lesion, widely patent ostial LAD stent, 30% proximal LAD stenosis. Medical therapy was recommended. Imdur 60 mg daily was added to her medical regimen. Beta blocker was initially increased and later reduced and was eventually discontinued due to symptomatic bradycardia. She is on as needed hydralazine for blood pressure spike. Patient was most recently seen by Dr. Antoine Poche on 11/07/2019 at which time she was doing well.  Abdominal ultrasound obtained on 12/13/2019 was negative for AAA.   Patient presents today for follow-up.  Since last Friday, she has been having increasing dizzy spell.  She also describes some chest tightness and shortness of breath.  Orthostatic vital sign shows a drop from systolic blood pressure 181 lying down to 159 standing up.  I recommend she increase fluid intake.  I also recommend a repeat carotid Doppler.  Her last carotid Doppler was from 2016.  I discussed the case with DOD Dr. Flora Lipps, we recommended increase Imdur to 90 mg daily and see in 2 weeks if her blood pressure, dizziness and chest discomfort has improved.  If they are improved, then no further work-up is needed.  If chest tightness still present, may need  to consider Myoview however she likely will have a small defect in the diagonal region.  Past Medical History:  Diagnosis Date   Arthritis    DDD, scoliosis, sees Dr. Lovell Sheehan for this, uses norco very rarely for pain   Bradycardia 11/11/2017   CAD (coronary artery disease)    LAD stenting of a 90% lesion 2012   Cystocele    Educated about COVID-19 virus infection 12/06/2019   Elevated cholesterol    GERD (gastroesophageal reflux disease)    dx in work up 2016 for atypical CP at OSH   pt. denies   Heart murmur    Hypertensive retinopathy    OU   Hypothyroidism    Interstitial cystitis    sees Dr. [name]   Macular degeneration    OU   NSTEMI (non-ST elevated myocardial infarction) [facility])    Osteoporosis    Rectocele    S/P hip replacement, right 06/12/2017   Scoliosis    Thyroid disease    Hypothyroid   Urinary incontinence    USI   Uterine prolapse     Past Surgical History:  Procedure Laterality Date   BLADDER SUSPENSION  2011   CATARACT EXTRACTION Bilateral 2015   Dr. Elmer Picker   CORONARY ANGIOPLASTY WITH STENT PLACEMENT  04/21/2010   LAD 80%, RCA 30%, nl EF, s/p DES LAD   EYE SURGERY     LEFT HEART CATH AND CORONARY ANGIOGRAPHY N/A 06/14/2017   Procedure: LEFT HEART  CATH AND CORONARY ANGIOGRAPHY;  Surgeon: Runell GessBerry, Jonathan J, MD;  [facility];  Service: Cardiovascular;  Laterality: N/A;   OOPHORECTOMY  2011   BSO   TOTAL HIP ARTHROPLASTY Right 06/10/2017   Procedure: RIGHT TOTAL HIP ARTHROPLASTY ANTERIOR APPROACH;  Surgeon: Samson FredericSwinteck, Brian, MD;  Location: [facility] ORS;  Service: Orthopedics;  Laterality: Right;  Needs RNFA   VAGINAL HYSTERECTOMY  2011   LAVH BSO; benign    Current Medications: Current Meds  Medication Sig   aspirin EC 81 MG tablet Take 81 mg by mouth daily.   Biotin w/ Vitamins C & E (HAIR/SKIN/NAILS PO) Take 1 tablet by mouth daily.   hydrALAZINE (APRESOLINE) 10 MG tablet Take 1 tablet (10 mg total) by mouth  2 (two) times daily.   isosorbide mononitrate (IMDUR) 60 MG 24 hr tablet Take 1.5 tablets (90 mg total) by mouth daily.   levothyroxine (SYNTHROID) 50 MCG tablet TAKE 1 TABLET EACH DAY.   Multiple Vitamins-Calcium (ONE-A-DAY WOMENS PO) Take 1 tablet by mouth daily.    Multiple Vitamins-Minerals (PRESERVISION AREDS 2 PO) Take 1 capsule by mouth 2 (two) times daily.   nitroGLYCERIN (NITROSTAT) 0.4 MG SL tablet Place 1 tablet (0.4 mg total) under the tongue every 5 (five) minutes as needed for chest pain.   PRESCRIPTION MEDICATION Avastin eye injections given by Dr Vanessa BarbaraZamora due to macular degeneration   rosuvastatin (CRESTOR) 10 MG tablet TAKE (1/2) TABLET DAILY.   [DISCONTINUED] isosorbide mononitrate (IMDUR) 60 MG 24 hr tablet Take 1 tablet (60 mg total) by mouth daily.     Allergies:   Acyclovir and related, Pravastatin sodium, and Zocor [simvastatin]   Social History   Socioeconomic History   Marital status: Widowed    Spouse name: Not on file   Number of children: Not on file   Years of education: Not on file   Highest education level: Not on file  Occupational History   Not on file  Tobacco Use   Smoking status: Never Smoker   Smokeless tobacco: Never Used  Vaping Use   Vaping Use: Never used  Substance and Sexual Activity   Alcohol use: No    Comment: Rare   Drug use: No   Sexual activity: Never    Birth control/protection: Surgical  Other Topics Concern   Not on file  Social History Narrative   Work or School:  none      Home Situation: lives with husband      Spiritual Beliefs: [employer]      Lifestyle: regular exercise (yoga, water aerobics); diet is healthy      Social Determinants of Corporate investment bankerHealth   Financial Resource Strain:    Difficulty of Paying Living Expenses: Not on file  Food Insecurity:    Worried About Programme researcher, broadcasting/film/videounning Out of Food in the Last Year: Not on file   [employer] Out of Food in the Last Year: Not on file  Transportation Needs:    Lack  of Transportation (Medical): Not on file   Lack of Transportation (Non-Medical): Not on file  Physical Activity:    Days of Exercise per Week: Not on file   Minutes of Exercise per Session: Not on file  Stress:    Feeling of Stress : Not on file  Social Connections:    Frequency of Communication with Friends and Family: Not on file   Frequency of Social Gatherings with Friends and Family: Not on file   Attends Religious Services: Not on file   Active Member  of Clubs or Organizations: Not on file   Attends Banker Meetings: Not on file   Marital Status: Not on file     Family History: The patient's family history includes Breast cancer in her cousin and paternal aunt; Colon cancer (age of onset: 62) in her paternal aunt; Diabetes in her sister; Endometrial cancer in her sister; Heart disease in her father, mother, and sister; Hypertension in her mother; Leukemia in her paternal aunt.  ROS:   Please see the history of present illness.     All other systems reviewed and are negative.  EKGs/Labs/Other Studies Reviewed:    The following studies were reviewed today:  Echo 06/13/2017 LV EF: 60% -  65%   -------------------------------------------------------------------  History:  PMH: Chest Pain. Coronary artery disease. Risk  factors: Dyslipidemia.   -------------------------------------------------------------------  Study Conclusions   - Left ventricle: The cavity size was normal. Wall thickness was  increased in a pattern of mild LVH. Systolic function was normal.  The estimated ejection fraction was in the range of 60% to 65%.  Wall motion was normal; there were no regional wall motion  abnormalities. Doppler parameters are consistent with abnormal  left ventricular relaxation (grade 1 diastolic dysfunction).  - Aortic valve: There was trivial regurgitation.  - Mitral valve: There was mild regurgitation.  - Right atrium: Central  venous pressure (est): 3 mm Hg.  - Tricuspid valve: There was mild regurgitation.  - Pulmonary arteries: PA peak pressure: 30 mm Hg (S).  - Pericardium, extracardiac: There was no pericardial effusion.   Impressions:   - Mild LVH with LVEF 60-65% and grade 1 diastolic dysfunction. Mild  mitral regurgitation. Trivial aortic regurgitation. Mild  tricuspid regurgitation with estimated PASP 30 mmHg.   EKG:  EKG is not ordered today.    Recent Labs: 03/04/2020: ALT 13; BUN 17; Creat 0.77; Hemoglobin 13.4; Platelets 252; Potassium 4.1; Sodium 142; TSH 2.72  Recent Lipid Panel    Component Value Date/Time   CHOL 157 03/04/2020 0828   CHOL 160 02/05/2017 0834   TRIG 140 03/04/2020 0828   HDL 63 03/04/2020 0828   HDL 52 02/05/2017 0834   CHOLHDL 2.5 03/04/2020 0828   VLDL 17.8 03/31/2019 0915   LDLCALC 72 03/04/2020 0828   LDLDIRECT 140.6 05/25/2012 0855     Risk Assessment/Calculations:       Physical Exam:    VS:  BP (!) 179/79    Pulse 63    Ht 5\' 3"  (1.6 m)    Wt 128 lb 9.6 oz (58.3 kg)    SpO2 97%    BMI 22.78 kg/m     Wt Readings from Last 3 Encounters:  05/20/20 128 lb 9.6 oz (58.3 kg)  03/11/20 129 lb 11.2 oz (58.8 kg)  12/07/19 130 lb 3.2 oz (59.1 kg)     GEN:  Well nourished, well developed in no acute distress HEENT: Normal NECK: No JVD; No carotid bruits LYMPHATICS: No lymphadenopathy CARDIAC: RRR, no murmurs, rubs, gallops RESPIRATORY:  Clear to auscultation without rales, wheezing or rhonchi  ABDOMEN: Soft, non-tender, non-distended MUSCULOSKELETAL:  No edema; No deformity  SKIN: Warm and dry NEUROLOGIC:  Alert and oriented x 3 PSYCHIATRIC:  Normal affect   ASSESSMENT:    1. Chest tightness   2. Hypertension, unspecified type   3. Dizziness   4. Coronary artery disease involving native coronary artery of native heart without angina pectoris   5. Hyperlipidemia LDL goal <70   6. Hypothyroidism, unspecified type  PLAN:    In order of problems  listed above:  1. Chest tightness: Unclear if are related to high blood pressure.  Symptom does not have clear correlation with exertion however Mrs. Klausner does not do much strenuous activity.  I discussed with DOD Dr. Flora Lipps, we recommended attempt to treat high blood pressure first to see if her symptoms would improve.  2. Hypertension: Increase Imdur to 90 mg daily  3. Dizziness: She had a history of dizziness with elevated blood pressure in the past.  However there does symptoms to be around 20 mmHg drop when she changed body positions.  I did encourage her to drink more fluid.  Obtain carotid Doppler.  4. CAD: Her chest tightness appears to be atypical and not only occurred recently when her blood pressure is quite elevated.  We will attempt to treat high blood pressure first by increase her Imdur to 90 mg daily  5. Hyperlipidemia: On Crestor  6. Hypothyroidism: On levothyroxine.   Medication Adjustments/Labs and Tests Ordered: Current medicines are reviewed at length with the patient today.  Concerns regarding medicines are outlined above.  Orders Placed This Encounter  Procedures   EKG 12-Lead   VAS US CAROTID   Meds ordered this encounter  Medications   isosorbide mononitrate (IMDUR) 60 MG 24 hr tablet    Sig: Take 1.5 tablets (90 mg total) by mouth daily.    Dispense:  135 tablet    Refill:  0    Patient Instructions  Medication Instructions:   INCREASE Imdur to 90 mg (1.5 tablets) daily  *If you need a refill on your cardiac medications before your next appointment, please call your pharmacy*  Lab Work: NONE ordered at this time of appointment   If you have labs (blood work) drawn today and your tests are completely normal, you will receive your results only by:  MyChart Message (if you have MyChart) OR  A paper copy in the mail If you have any lab test that is abnormal or we need to change your treatment, we will call you to review the  results.  Testing/Procedures: Your physician has requested that you have a carotid duplex. This test is an ultrasound of the carotid arteries in your neck. It looks at blood flow through these arteries that supply the brain with blood. Allow one hour for this exam. There are no restrictions or special instructions.   Please schedule for 1-2 weeks   Follow-Up: At [facility], you and your health needs are our priority.  As part of our continuing mission to provide you with exceptional heart care, we have created designated Provider Care Teams.  These Care Teams include your primary Cardiologist (physician) and Advanced Practice Providers (APPs -  Physician Assistants and Nurse Practitioners) who all work together to provide you with the care you need, when you need it.  Your next appointment:   1 week(s)  The format for your next appointment:   Virtual Visit   Provider:   Azalee Course, PA-C  Other Instructions  Continue to monitor Blood pressure at home and take it 2 times a day. 1st blood pressure to be take 2 hours after taking morning medications. 2nd Blood pressure should be taken at the same time in the evenings.   Your daily fluid intake should be between 32-64 ounces      Signed, Azalee Course, [place]  05/22/2020 11:41 PM    [facility]

## 2020-05-20 NOTE — Patient Instructions (Addendum)
Medication Instructions:   INCREASE Imdur to 90 mg (1.5 tablets) daily  *If you need a refill on your cardiac medications before your next appointment, please call your pharmacy*  Lab Work: NONE ordered at this time of appointment   If you have labs (blood work) drawn today and your tests are completely normal, you will receive your results only by: Marland Kitchen MyChart Message (if you have MyChart) OR . A paper copy in the mail If you have any lab test that is abnormal or we need to change your treatment, we will call you to review the results.  Testing/Procedures: Your physician has requested that you have a carotid duplex. This test is an ultrasound of the carotid arteries in your neck. It looks at blood flow through these arteries that supply the brain with blood. Allow one hour for this exam. There are no restrictions or special instructions.   Please schedule for 1-2 weeks   Follow-Up: At [facility], you and your health needs are our priority.  As part of our continuing mission to provide you with exceptional heart care, we have created designated Provider Care Teams.  These Care Teams include your primary Cardiologist (physician) and Advanced Practice Providers (APPs -  Physician Assistants and Nurse Practitioners) who all work together to provide you with the care you need, when you need it.  Your next appointment:   1 week(s)  The format for your next appointment:   Virtual Visit   Provider:   Azalee Course, PA-C  Other Instructions  Continue to monitor Blood pressure at home and take it 2 times a day. 1st blood pressure to be take 2 hours after taking morning medications. 2nd Blood pressure should be taken at the same time in the evenings.   Your daily fluid intake should be between 32-64 ounces

## 2020-05-20 NOTE — Telephone Encounter (Signed)
error 

## 2020-05-22 ENCOUNTER — Encounter: Payer: Self-pay | Admitting: Physician Assistant

## 2020-05-27 ENCOUNTER — Telehealth (INDEPENDENT_AMBULATORY_CARE_PROVIDER_SITE_OTHER): Payer: Medicare Other | Admitting: Physician Assistant

## 2020-05-27 ENCOUNTER — Encounter: Payer: Self-pay | Admitting: Physician Assistant

## 2020-05-27 ENCOUNTER — Telehealth: Payer: Self-pay

## 2020-05-27 VITALS — BP 129/70 | HR 53 | Ht 63.0 in | Wt 128.0 lb

## 2020-05-27 DIAGNOSIS — E785 Hyperlipidemia, unspecified: Secondary | ICD-10-CM | POA: Diagnosis not present

## 2020-05-27 DIAGNOSIS — R072 Precordial pain: Secondary | ICD-10-CM

## 2020-05-27 DIAGNOSIS — I10 Essential (primary) hypertension: Secondary | ICD-10-CM

## 2020-05-27 DIAGNOSIS — E039 Hypothyroidism, unspecified: Secondary | ICD-10-CM

## 2020-05-27 DIAGNOSIS — I25119 Atherosclerotic heart disease of native coronary artery with unspecified angina pectoris: Secondary | ICD-10-CM | POA: Diagnosis not present

## 2020-05-27 NOTE — Patient Instructions (Addendum)
Medication Instructions:  Your physician recommends that you continue on your current medications as directed. Please refer to the Current Medication list given to you today.  *If you need a refill on your cardiac medications before your next appointment, please call your pharmacy*  Lab Work: NONE ordered at this time of appointment   If you have labs (blood work) drawn today and your tests are completely normal, you will receive your results only by: Marland Kitchen MyChart Message (if you have MyChart) OR . A paper copy in the mail If you have any lab test that is abnormal or we need to change your treatment, we will call you to review the results.  Testing/Procedures: Your physician has requested that you have a lexiscan myoview. For further information please visit [email or web address] Please follow instruction sheet, as given.   Please schedule for this week   Follow-Up: At [facility], you and your health needs are our priority.  As part of our continuing mission to provide you with exceptional heart care, we have created designated Provider Care Teams.  These Care Teams include your primary Cardiologist (physician) and Advanced Practice Providers (APPs -  Physician Assistants and Nurse Practitioners) who all work together to provide you with the care you need, when you need it.    Your next appointment:   Follow up as scheduled-06/17/20 at 10:20 AM    The format for your next appointment:   In Person  Provider:   Rollene Rotunda, MD  Other Instructions

## 2020-05-27 NOTE — Telephone Encounter (Signed)
  Patient Consent for Virtual Visit         [name] has provided verbal consent on 05/27/2020 for a virtual visit (video or telephone).   CONSENT FOR VIRTUAL VISIT FOR:  Kaitlyn Parks  By participating in this virtual visit I agree to the following:  I hereby voluntarily request, consent and authorize [facility] and its employed or contracted physicians, physician assistants, nurse practitioners or other licensed health care professionals (the Practitioner), to provide me with telemedicine health care services (the "Services") as deemed necessary by the treating Practitioner. I acknowledge and consent to receive the Services by the Practitioner via telemedicine. I understand that the telemedicine visit will involve communicating with the Practitioner through live audiovisual communication technology and the disclosure of certain medical information by electronic transmission. I acknowledge that I have been given the opportunity to request an in-person assessment or other available alternative prior to the telemedicine visit and am voluntarily participating in the telemedicine visit.  I understand that I have the right to withhold or withdraw my consent to the use of telemedicine in the course of my care at any time, without affecting my right to future care or treatment, and that the Practitioner or I may terminate the telemedicine visit at any time. I understand that I have the right to inspect all information obtained and/or recorded in the course of the telemedicine visit and may receive copies of available information for a reasonable fee.  I understand that some of the potential risks of receiving the Services via telemedicine include:  Marland Kitchen Delay or interruption in medical evaluation due to technological equipment failure or disruption; . Information transmitted may not be sufficient (e.g. poor resolution of images) to allow for appropriate medical decision making by the Practitioner;  and/or  . In rare instances, security protocols could fail, causing a breach of personal health information.  Furthermore, I acknowledge that it is my responsibility to provide information about my medical history, conditions and care that is complete and accurate to the best of my ability. I acknowledge that Practitioner's advice, recommendations, and/or decision may be based on factors not within their control, such as incomplete or inaccurate data provided by me or distortions of diagnostic images or specimens that may result from electronic transmissions. I understand that the practice of medicine is not an exact science and that Practitioner makes no warranties or guarantees regarding treatment outcomes. I acknowledge that a copy of this consent can be made available to me via my patient portal [facility] MyChart), or I can request a printed copy by calling the office of [facility].    I understand that my insurance will be billed for this visit.   I have read or had this consent read to me. . I understand the contents of this consent, which adequately explains the benefits and risks of the Services being provided via telemedicine.  . I have been provided ample opportunity to ask questions regarding this consent and the Services and have had my questions answered to my satisfaction. . I give my informed consent for the services to be provided through the use of telemedicine in my medical care

## 2020-05-27 NOTE — Progress Notes (Signed)
Virtual Visit via Telephone Note   This visit type was conducted due to national recommendations for restrictions regarding the COVID-19 Pandemic (e.g. social distancing) in an effort to limit this patient's exposure and mitigate transmission in our community.  Due to her co-morbid illnesses, this patient is at least at moderate risk for complications without adequate follow up.  This format is felt to be most appropriate for this patient at this time.  The patient did not have access to video technology/had technical difficulties with video requiring transitioning to audio format only (telephone).  All issues noted in this document were discussed and addressed.  No physical exam could be performed with this format.  Please refer to the patient's chart for her  consent to telehealth for [facility].    Date:  05/29/2020   ID:  Kaitlyn Parks, DOB 06/12/1935, MRN [id number] The patient was identified using 2 identifiers.  Patient Location: Home Provider Location: Office/Clinic  PCP:  Wynn Banker, MD  Cardiologist:  Rollene Rotunda, MD  Electrophysiologist:  None   Evaluation Performed:  Follow-Up Visit  Chief Complaint:  Follow up  History of Present Illness:    Kaitlyn Parks is a 85 y.o. female with hypothyroidism, HTN, HLD and CAD. She had a PCI of LAD in 2012. CTA of the chest in Nov 2018 was negative for PE however troponin was elevated at 0.42. Echocardiogram obtained on 06/13/2017 showed EF 60-65%, mild LVH, grade 1 DD, mild MR, mild TR, peak PA pressure 30 mmHg. Cardiac catheterization performed on 06/14/2017 showed 95% ostial D1 lesion, widely patent ostial LAD stent, 30% proximal LAD stenosis. Medical therapy was recommended. Imdur 60 mg daily was added to her medical regimen. Beta blocker was initially increased and later reduced and was eventually discontinued due to symptomatic bradycardia. She is on PRN hydralazine for blood pressure spike. Patient was most recently  seen by Dr. Antoine Poche on 11/07/2019 at which time she was doing well.  Abdominal ultrasound obtained on 12/13/2019 was negative for AAA.   I last saw the patient on 05/20/2020, she was having increasing dizzy spell and some chest tightness and shortness of breath.  Orthostatic vital signs shows drop from systolic pressure of 181 lying down to 159 standing.  I recommended increase fluid intake.  I discussed the case with DOD Dr. Flora Lipps, we recommended increase Imdur to 90 mg daily.   Patient presents today for virtual visit.  She came in her blood pressure readings for the past week.  Majority of the time, her systolic blood pressure has been in the 110s to 130s range with occasional spike into the 160s 180s range.  She is using extra dose of hydralazine on as-needed basis.  In the future, if her nighttime blood pressure remain uncontrolled, we may consider increase hydralazine to 3 times daily dosing.  Despite better control of her blood pressure, she still experience occasional chest discomfort and left arm numbness.  I recommended proceeding with a Myoview this week to make sure there is no large area of ischemia.  Previous cardiac catheterization revealed a 95% lesion in a small diagonal branch.  As long as the size of ischemia is not large, I think we can manage medically.   The patient does not have symptoms concerning for COVID-19 infection (fever, chills, cough, or new shortness of breath).    Past Medical History:  Diagnosis Date  . Arthritis    DDD, scoliosis, sees Dr. Lovell Sheehan for this, uses norco very rarely  for pain  . Bradycardia 11/11/2017  . CAD (coronary artery disease)    LAD stenting of a 90% lesion 2012  . Cystocele   . Educated about COVID-19 virus infection 12/06/2019  . Elevated cholesterol   . GERD (gastroesophageal reflux disease)    dx in work up 2016 for atypical CP at OSH   pt. denies  . Heart murmur   . Hypertensive retinopathy    OU  . Hypothyroidism   . Interstitial  cystitis    sees Dr. [name]  . Macular degeneration    OU  . NSTEMI (non-ST elevated myocardial infarction) (HCC)   . Osteoporosis   . Rectocele   . 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 Scoliosis   . Thyroid disease    Hypothyroid  . Urinary incontinence    USI  . Uterine prolapse    Past Surgical History:  Procedure Laterality Date  . BLADDER SUSPENSION  2011  . CATARACT EXTRACTION Bilateral 2015   Dr. Elmer Picker  . CORONARY ANGIOPLASTY WITH STENT PLACEMENT  04/21/2010   LAD 80%, RCA 30%, nl EF, s/p DES LAD  . EYE SURGERY    . LEFT HEART CATH AND CORONARY ANGIOGRAPHY N/A 06/14/2017   Procedure: LEFT HEART CATH AND CORONARY ANGIOGRAPHY;  Surgeon: Runell Gess, MD;  Location: [facility];  Service: Cardiovascular;  Laterality: N/A;  . OOPHORECTOMY  2011   BSO  . TOTAL HIP ARTHROPLASTY Right 06/10/2017   Procedure: RIGHT TOTAL HIP ARTHROPLASTY ANTERIOR APPROACH;  Surgeon: Samson Frederic, MD;  Location: [facility];  Service: Orthopedics;  Laterality: Right;  Needs RNFA  . VAGINAL HYSTERECTOMY  2011   LAVH BSO; benign     Current Meds  Medication Sig  . aspirin EC 81 MG tablet Take 81 mg by mouth daily.  . Biotin w/ Vitamins C & E (HAIR/SKIN/NAILS PO) Take 1 tablet by mouth daily.  . isosorbide mononitrate (IMDUR) 60 MG 24 hr tablet Take 1.5 tablets (90 mg total) by mouth daily.  Marland Kitchen levothyroxine (SYNTHROID) 50 MCG tablet TAKE 1 TABLET EACH DAY.  . Multiple Vitamins-Calcium (ONE-A-DAY WOMENS PO) Take 1 tablet by mouth daily.   . Multiple Vitamins-Minerals (PRESERVISION AREDS 2 PO) Take 1 capsule by mouth 2 (two) times daily.  Marland Kitchen PRESCRIPTION MEDICATION Avastin eye injections given by Dr Vanessa Barbara due to macular degeneration  . rosuvastatin (CRESTOR) 10 MG tablet TAKE (1/2) TABLET DAILY.  . [DISCONTINUED] hydrALAZINE (APRESOLINE) 10 MG tablet Take 1 tablet (10 mg total) by mouth 2 (two) times daily.     Allergies:   Acyclovir and related, Pravastatin sodium, and Zocor  [simvastatin]   Social History   Tobacco Use  . Smoking status: Never Smoker  . Smokeless tobacco: Never Used  Vaping Use  . Vaping Use: Never used  Substance Use Topics  . Alcohol use: No    Comment: Rare  . Drug use: No     Family Hx: The patient's family history includes Breast cancer in her cousin and paternal aunt; Colon cancer (age of onset: 38) in her paternal aunt; Diabetes in her sister; Endometrial cancer in her sister; Heart disease in her father, mother, and sister; Hypertension in her mother; Leukemia in her paternal aunt.  ROS:   Please see the history of present illness.     All other systems reviewed and are negative.   Prior CV studies:   The following studies were reviewed today:  Echo 06/13/2017 LV EF: 60% -  65%   -------------------------------------------------------------------  History:  PMH: Chest Pain. Coronary artery disease. Risk  factors: Dyslipidemia.   -------------------------------------------------------------------  Study Conclusions   - Left ventricle: The cavity size was normal. Wall thickness was  increased in a pattern of mild LVH. Systolic function was normal.  The estimated ejection fraction was in the range of 60% to 65%.  Wall motion was normal; there were no regional wall motion  abnormalities. Doppler parameters are consistent with abnormal  left ventricular relaxation (grade 1 diastolic dysfunction).  - Aortic valve: There was trivial regurgitation.  - Mitral valve: There was mild regurgitation.  - Right atrium: Central venous pressure (est): 3 mm Hg.  - Tricuspid valve: There was mild regurgitation.  - Pulmonary arteries: PA peak pressure: 30 mm Hg (S).  - Pericardium, extracardiac: There was no pericardial effusion.   Impressions:   - Mild LVH with LVEF 60-65% and grade 1 diastolic dysfunction. Mild  mitral regurgitation. Trivial aortic regurgitation. Mild  tricuspid regurgitation with estimated  PASP 30 mmHg.    Labs/Other Tests and Data Reviewed:    EKG:  An ECG dated 05/20/2020 was personally reviewed today and demonstrated:  Normal sinus rhythm without significant ST-T wave changes.  Recent Labs: 03/04/2020: ALT 13; BUN 17; Creat 0.77; Hemoglobin 13.4; Platelets 252; Potassium 4.1; Sodium 142; TSH 2.72   Recent Lipid Panel Lab Results  Component Value Date/Time   CHOL 157 03/04/2020 08:28 AM   CHOL 160 02/05/2017 08:34 AM   TRIG 140 03/04/2020 08:28 AM   HDL 63 03/04/2020 08:28 AM   HDL 52 02/05/2017 08:34 AM   CHOLHDL 2.5 03/04/2020 08:28 AM   LDLCALC 72 03/04/2020 08:28 AM   LDLDIRECT 140.6 05/25/2012 08:55 AM    Wt Readings from Last 3 Encounters:  05/29/20 128 lb (58.1 kg)  05/27/20 128 lb (58.1 kg)  05/20/20 128 lb 9.6 oz (58.3 kg)     Risk Assessment/Calculations:      Objective:    Vital Signs:  BP 129/70   Pulse (!) 53   Ht 5\' 3"  (1.6 m)   Wt 128 lb (58.1 kg)   BMI 22.67 kg/m    VITAL SIGNS:  reviewed  ASSESSMENT & PLAN:    1. Precordial chest pain: Symptom does not have clear correlation with exertion.  I recommended proceeding with Myoview  2. CAD: See #1, continue aspirin and Crestor  3. Hypertension: Blood pressure stable on current therapy.  She did have some dizziness when her blood pressure is higher, however now her blood pressure has normalized.  Dizziness has resolved  4. Hyperlipidemia: On Crestor  5. Hypothyroidism: On levothyroxine.   COVID-19 Education: The signs and symptoms of COVID-19 were discussed with the patient and how to seek care for testing (follow up with PCP or arrange E-visit).  The importance of social distancing was discussed today.  Time:   Today, I have spent 20 minutes with the patient with telehealth technology discussing the above problems.     Medication Adjustments/Labs and Tests Ordered: Current medicines are reviewed at length with the patient today.  Concerns regarding medicines are outlined  above.   Tests Ordered: Orders Placed This Encounter  Procedures  . MYOCARDIAL PERFUSION IMAGING    Medication Changes: No orders of the defined types were placed in this encounter.   Follow Up:  In Person in 1 month(s)  Signed, , Azalee Course  05/29/2020 11:33 PM     [facility]

## 2020-05-28 ENCOUNTER — Telehealth (HOSPITAL_COMMUNITY): Payer: Self-pay | Admitting: *Deleted

## 2020-05-28 NOTE — Telephone Encounter (Signed)
Close encounter 

## 2020-05-29 ENCOUNTER — Ambulatory Visit (HOSPITAL_COMMUNITY)
Admission: RE | Admit: 2020-05-29 | Discharge: 2020-05-29 | Disposition: A | Discharge status: Home / Self Care | Payer: Medicare Other | Source: Ambulatory Visit | Attending: Cardiovascular Disease | Admitting: Cardiovascular Disease

## 2020-05-29 ENCOUNTER — Other Ambulatory Visit: Payer: Self-pay

## 2020-05-29 ENCOUNTER — Other Ambulatory Visit: Payer: Self-pay | Admitting: Physician Assistant

## 2020-05-29 ENCOUNTER — Encounter: Payer: Self-pay | Admitting: Physician Assistant

## 2020-05-29 DIAGNOSIS — R072 Precordial pain: Secondary | ICD-10-CM | POA: Insufficient documentation

## 2020-05-29 LAB — MYOCARDIAL PERFUSION IMAGING
LV dias vol: 77 mL (ref 46–106)
LV sys vol: 30 mL
Peak HR: 104 {beats}/min
Rest HR: 69 {beats}/min
SDS: 0
SRS: 0
SSS: 0
TID: 1.04

## 2020-05-29 MED ORDER — TECHNETIUM TC 99M TETROFOSMIN IV KIT
10.4000 | PACK | Freq: Once | INTRAVENOUS | Status: AC | PRN
  Administered 2020-05-29: 10.4 via INTRAVENOUS
  Filled 2020-05-29: qty 11

## 2020-05-29 MED ORDER — AMINOPHYLLINE 25 MG/ML IV SOLN
75.0000 mg | Freq: Once | INTRAVENOUS | Status: AC
  Administered 2020-05-29: 75 mg via INTRAVENOUS

## 2020-05-29 MED ORDER — REGADENOSON 0.4 MG/5ML IV SOLN
0.4000 mg | Freq: Once | INTRAVENOUS | Status: AC
  Administered 2020-05-29: 0.4 mg via INTRAVENOUS

## 2020-05-29 MED ORDER — TECHNETIUM TC 99M TETROFOSMIN IV KIT
31.2000 | PACK | Freq: Once | INTRAVENOUS | Status: AC | PRN
  Administered 2020-05-29: 31.2 via INTRAVENOUS
  Filled 2020-05-29: qty 32

## 2020-05-29 MED ORDER — HYDRALAZINE HCL 10 MG PO TABS: ORAL_TABLET | ORAL | 3 refills | Status: DC

## 2020-05-29 NOTE — Progress Notes (Signed)
I have called and personally gave Kaitlyn Parks the result. She is cleared to go on her planned travel.

## 2020-06-10 ENCOUNTER — Ambulatory Visit (HOSPITAL_COMMUNITY)
Admission: RE | Admit: 2020-06-10 | Discharge: 2020-06-10 | Disposition: A | Discharge status: Home / Self Care | Payer: Medicare Other | Source: Ambulatory Visit | Attending: Cardiology | Admitting: Cardiology

## 2020-06-10 DIAGNOSIS — R42 Dizziness and giddiness: Secondary | ICD-10-CM | POA: Diagnosis present

## 2020-06-16 NOTE — Progress Notes (Signed)
Cardiology Office Note   Date:  06/17/2020   ID:  Kaitlyn Parks, Kaitlyn Parks 1934-11-11, MRN [id number]  PCP:  Wynn Banker, MD  Cardiologist:   Rollene Rotunda, MD   Chief Complaint  Patient presents with  . Dizziness      History of Present Illness: Kaitlyn Parks is a 85 y.o. female who presents for followup up of CAD.  She had a PCI of LAD in 2012. She recently underwent total right hip arthroplasty. She presented to the hospital on 06/12/2017 with sudden onset of right-sided neck pain with radiation to the right jaw. CTA of the chest was negative for PE however troponin was elevated at 0.42. Echocardiogram was obtained on 06/13/2017 showed EF 60-65%, mild LVH, grade 1 DD, mild MR, mild TR, peak PA pressure 30 mmHg. Cardiac catheterization performed on 06/14/2017 showed 95% ostial D1 lesion, widely patent ostial LAD stent, 30% proximal LAD stenosis. Medical therapy was recommended. Imdur 60 mg daily was added to her medical regimen. Beta blocker was initially increased and later reduced due to symptomatic bradycardia. At a previous visit I stopped the beta blocker.   She had chest pain recently and had a negative perfusion study.    She has been taking her hydralazine 3 times a day.  She might take a fourth line if her blood pressures are running high.  I did review a blood pressure diary and she will occasionally be in the 180s worse 170s.  If it is that high she might get dizzy and she will take her hydralazine.  She is not having to do this very frequently and is actually feeling pretty good about this.  She is not having any chest pressure, neck or arm discomfort.  She is not having any shortness of breath, PND or orthopnea.  She does water aerobics routinely.    Past Medical History:  Diagnosis Date  . Arthritis    DDD, scoliosis, sees Dr. Lovell Sheehan for this, uses norco very rarely for pain  . Bradycardia 11/11/2017  . CAD (coronary artery disease)    LAD stenting of a 90%  lesion 2012  . Cystocele   . Educated about COVID-19 virus infection 12/06/2019  . Elevated cholesterol   . GERD (gastroesophageal reflux disease)    dx in work up 2016 for atypical CP at OSH   pt. denies  . Heart murmur   . Hypertensive retinopathy    OU  . Hypothyroidism   . Interstitial cystitis    sees Dr. [name]  . Macular degeneration    OU  . NSTEMI (non-ST elevated myocardial infarction) (HCC)   . Osteoporosis   . Rectocele   . 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 Scoliosis   . Thyroid disease    Hypothyroid  . Urinary incontinence    USI  . Uterine prolapse     Past Surgical History:  Procedure Laterality Date  . BLADDER SUSPENSION  2011  . CATARACT EXTRACTION Bilateral 2015   Dr. Elmer Picker  . CORONARY ANGIOPLASTY WITH STENT PLACEMENT  04/21/2010   LAD 80%, RCA 30%, nl EF, s/p DES LAD  . EYE SURGERY    . LEFT HEART CATH AND CORONARY ANGIOGRAPHY N/A 06/14/2017   Procedure: LEFT HEART CATH AND CORONARY ANGIOGRAPHY;  Surgeon: Runell Gess, MD;  Location: [facility];  Service: Cardiovascular;  Laterality: N/A;  . OOPHORECTOMY  2011   BSO  . TOTAL HIP ARTHROPLASTY Right 06/10/2017   Procedure: RIGHT TOTAL  HIP ARTHROPLASTY ANTERIOR APPROACH;  Surgeon: Samson Frederic, MD;  Location: [facility];  Service: Orthopedics;  Laterality: Right;  Needs RNFA  . VAGINAL HYSTERECTOMY  2011   LAVH BSO; benign     Current Outpatient Medications  Medication Sig Dispense Refill  . aspirin EC 81 MG tablet Take 81 mg by mouth daily.    . Biotin w/ Vitamins C & E (HAIR/SKIN/NAILS PO) Take 1 tablet by mouth daily.    . hydrALAZINE (APRESOLINE) 10 MG tablet 10 mg twice a day, may take extra dose for SBP >160 up to a total of 4 times a day 180 tablet 3  . isosorbide mononitrate (IMDUR) 60 MG 24 hr tablet Take 1.5 tablets (90 mg total) by mouth daily. 135 tablet 0  . levothyroxine (SYNTHROID) 50 MCG tablet TAKE 1 TABLET EACH DAY. 90 tablet 1  . Multiple Vitamins-Calcium  (ONE-A-DAY WOMENS PO) Take 1 tablet by mouth daily.     . Multiple Vitamins-Minerals (PRESERVISION AREDS 2 PO) Take 1 capsule by mouth 2 (two) times daily.    . nitroGLYCERIN (NITROSTAT) 0.4 MG SL tablet Place 1 tablet (0.4 mg total) under the tongue every 5 (five) minutes as needed for chest pain. 25 tablet 8  . PRESCRIPTION MEDICATION Avastin eye injections given by Dr Vanessa Barbara due to macular degeneration    . rosuvastatin (CRESTOR) 10 MG tablet TAKE (1/2) TABLET DAILY. 45 tablet 3   No current facility-administered medications for this visit.    Allergies:   Acyclovir and related, Pravastatin sodium, and Zocor [simvastatin]     ROS:  Please see the history of present illness.   Otherwise, review of systems are positive for sacroiliac joint discomfort and scoliosis..   All other systems are reviewed and negative.    PHYSICAL EXAM: VS:  BP 134/72   Pulse 66   Ht 5\' 3"  (1.6 m)   Wt 130 lb (59 kg)   SpO2 98%   BMI 23.03 kg/m  , BMI Body mass index is 23.03 kg/m. GENERAL:  Well appearing NECK:  No jugular venous distention, waveform within normal limits, carotid upstroke brisk and symmetric, bilateral carotid bruits, no thyromegaly LUNGS:  Clear to auscultation bilaterally CHEST:  Unremarkable HEART:  PMI not displaced or sustained,S1 and S2 within normal limits, no S3, no S4, no clicks, no rubs, no murmurs ABD:  Flat, positive bowel sounds normal in frequency in pitch, no bruits, no rebound, no guarding, no midline pulsatile mass, no hepatomegaly, no splenomegaly EXT:  2 plus pulses throughout, no edema, no cyanosis no clubbing   EKG:  EKG is   ordered today. The ekg ordered today demonstrates 66, axis within normal limits, intervals within normal limits, no acute ST-T wave changes.   Recent Labs: 03/04/2020: ALT 13; BUN 17; Creat 0.77; Hemoglobin 13.4; Platelets 252; Potassium 4.1; Sodium 142; TSH 2.72    Lipid Panel    Component Value Date/Time   CHOL 157 03/04/2020 0828    CHOL 160 02/05/2017 0834   TRIG 140 03/04/2020 0828   HDL 63 03/04/2020 0828   HDL 52 02/05/2017 0834   CHOLHDL 2.5 03/04/2020 0828   VLDL 17.8 03/31/2019 0915   LDLCALC 72 03/04/2020 0828   LDLDIRECT 140.6 05/25/2012 0855      Wt Readings from Last 3 Encounters:  06/17/20 130 lb (59 kg)  05/29/20 128 lb (58.1 kg)  05/27/20 128 lb (58.1 kg)      Other studies Reviewed: Additional studies/ records that were reviewed today include: Labs  Review of the above records demonstrates:    Please see elsewhere in the note.     ASSESSMENT AND PLAN:   CAD, NATIVE VESSEL -  She had a negative perfusion study.  No further work-up is suggested.   HYPERTENSION -  Her blood pressure is somewhat labile.  However, she has a good handle on how to manage it and I will not make any changes to her meds.   DIZZINESS - This seems to correlate with the higher blood pressures and is treated with the meds as listed and she is having fewer problems related to this.    Current medicines are reviewed at length with the patient today.  The patient does not have concerns regarding medicines.  The following changes have been made:   None  Labs/ tests ordered today include: None  Orders Placed This Encounter  Procedures  . EKG 12-Lead     Disposition:   FU with me in 6 months per her request.     Signed, Rollene Rotunda, MD  06/17/2020 11:07 AM    [facility]

## 2020-06-17 ENCOUNTER — Ambulatory Visit: Payer: Medicare Other | Admitting: Cardiology

## 2020-06-17 ENCOUNTER — Encounter: Payer: Self-pay | Admitting: Cardiology

## 2020-06-17 VITALS — BP 134/72 | HR 66 | Ht 63.0 in | Wt 130.0 lb

## 2020-06-17 DIAGNOSIS — R42 Dizziness and giddiness: Secondary | ICD-10-CM | POA: Diagnosis not present

## 2020-06-17 DIAGNOSIS — I2510 Atherosclerotic heart disease of native coronary artery without angina pectoris: Secondary | ICD-10-CM

## 2020-06-17 DIAGNOSIS — I10 Essential (primary) hypertension: Secondary | ICD-10-CM

## 2020-06-17 NOTE — Patient Instructions (Addendum)
Medication Instructions:  No changes *If you need a refill on your cardiac medications before your next appointment, please call your pharmacy*  Lab Work: None ordered this visit If you have labs (blood work) drawn today and your tests are completely normal, you will receive your results only by:  MyChart Message (if you have MyChart) OR  A paper copy in the mail If you have any lab test that is abnormal or we need to change your treatment, we will call you to review the results.  Testing/Procedures: None ordered this visit  Follow-Up: At [facility], you and your health needs are our priority.  As part of our continuing mission to provide you with exceptional heart care, we have created designated Provider Care Teams.  These Care Teams include your primary Cardiologist (physician) and Advanced Practice Providers (APPs -  Physician Assistants and Nurse Practitioners) who all work together to provide you with the care you need, when you need it.  Your next appointment:   6 month(s)  You will receive a reminder letter in the mail two months in advance. If you don't receive a letter, please call our office to schedule the follow-up appointment.  The format for your next appointment:   In Person  Provider:   Rollene Rotunda, MD

## 2020-06-20 NOTE — Progress Notes (Signed)
[facility] - Clinic Note  07/01/2020     CHIEF COMPLAINT Patient presents for Retina Follow Up   HISTORY OF PRESENT ILLNESS: Kaitlyn Parks is a 85 y.o. female who presents to the clinic today for:   HPI    Retina Follow Up    Patient presents with  Wet AMD.  In both eyes.  Duration of 9 weeks.  Since onset it is stable.  I, the attending physician,  performed the HPI with the patient and updated documentation appropriately.          Comments    9 week follow up Exu ARMD OU- Dryness OD>OS which affects her vision at times.  Using Systane gel qhs OU and PF ATs throughout the day.       Last edited by Rennis Chris, MD on 07/01/2020 10:47 PM. (History)    pt states her eyes seems stable, but they seem like there is a "constant fuzziness" and they are never totally clear, she states she has a hard time seeing street signs, pt saw Dr. Krista Blue a few months ago and was able to get new reading glasses, she goes back to see Elmer Picker on Wednesday   Referring physician: Demarco, [place], OD [address] [place],  [place] [address]  HISTORICAL INFORMATION:   Selected notes from the MEDICAL RECORD NUMBER Referred by Dr. [place] DeMarco for concern of exu ARMD LEE: 11.26.19 (J. 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 (Ophthalmic Drugs)   No current facility-administered medications for this visit. (Ophthalmic Drugs)   Current Outpatient Medications (Other)  Medication Sig  . aspirin EC 81 MG tablet Take 81 mg by mouth daily.  . Biotin w/ Vitamins C & E (HAIR/SKIN/NAILS PO) Take 1 tablet by mouth daily.  . hydrALAZINE (APRESOLINE) 10 MG tablet 10 mg twice a day, may take extra dose for SBP >160 up to a total of 4 times a day  . isosorbide mononitrate (IMDUR) 60 MG 24 hr tablet Take 1.5 tablets (90  mg total) by mouth daily.  Marland Kitchen levothyroxine (SYNTHROID) 50 MCG tablet TAKE 1 TABLET EACH DAY.  . Multiple Vitamins-Calcium (ONE-A-DAY WOMENS PO) Take 1 tablet by mouth daily.   . Multiple Vitamins-Minerals (PRESERVISION AREDS 2 PO) Take 1 capsule by mouth 2 (two) times daily.  . nitroGLYCERIN (NITROSTAT) 0.4 MG SL tablet DISSOLVE 1 TABLET UNDER TONGUE IF NEEDED FOR CHEST PAIN. MAY REPEAT IN 5 MINUTES FOR 3 DOSES.  Marland Kitchen PRESCRIPTION MEDICATION Avastin eye injections given by Dr Vanessa Barbara due to macular degeneration  . rosuvastatin (CRESTOR) 10 MG tablet TAKE (1/2) TABLET DAILY.   No current facility-administered medications for this visit. (Other)      REVIEW OF SYSTEMS: ROS    Positive for: Gastrointestinal, Neurological, Musculoskeletal, Cardiovascular, Eyes   Negative for: Constitutional, Skin, Genitourinary, HENT, Endocrine, Respiratory, Psychiatric, Allergic/Imm, Heme/Lymph   Last edited by Joni Reining, COA on 07/01/2020  2:13 PM. (History)       ALLERGIES Allergies  Allergen Reactions  . Acyclovir And Related Other (See Comments)    unknown  . Pravastatin Sodium Other (See Comments)    cystitis  . Zocor [Simvastatin] Other (See Comments)    cystitis    PAST MEDICAL HISTORY Past Medical History:  Diagnosis Date  . Arthritis    DDD, scoliosis, sees Dr. Lovell Sheehan for this, uses norco very  rarely for pain  . Bradycardia 11/11/2017  . CAD (coronary artery disease)    LAD stenting of a 90% lesion 2012  . Cystocele   . Educated about COVID-19 virus infection 12/06/2019  . Elevated cholesterol   . GERD (gastroesophageal reflux disease)    dx in work up 2016 for atypical CP at OSH   pt. denies  . Heart murmur   . Hypertensive retinopathy    OU  . Hypothyroidism   . Interstitial cystitis    sees Dr. [name]  . Macular degeneration    OU  . NSTEMI (non-ST elevated myocardial infarction) (HCC)   . Osteoporosis   . Rectocele   . 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 Scoliosis    . Thyroid disease    Hypothyroid  . Urinary incontinence    USI  . Uterine prolapse    Past Surgical History:  Procedure Laterality Date  . BLADDER SUSPENSION  2011  . CATARACT EXTRACTION Bilateral 2015   Dr. Elmer Picker  . CORONARY ANGIOPLASTY WITH STENT PLACEMENT  04/21/2010   LAD 80%, RCA 30%, nl EF, s/p DES LAD  . EYE SURGERY    . LEFT HEART CATH AND CORONARY ANGIOGRAPHY N/A 06/14/2017   Procedure: LEFT HEART CATH AND CORONARY ANGIOGRAPHY;  Surgeon: Runell Gess, MD;  Location: [facility] INVASIVE [facility];  Service: Cardiovascular;  Laterality: N/A;  . OOPHORECTOMY  2011   BSO  . TOTAL HIP ARTHROPLASTY Right 06/10/2017   Procedure: RIGHT TOTAL HIP ARTHROPLASTY ANTERIOR APPROACH;  Surgeon: Samson Frederic, MD;  Location: [facility];  Service: Orthopedics;  Laterality: Right;  Needs RNFA  . VAGINAL HYSTERECTOMY  2011   LAVH BSO; benign    FAMILY HISTORY Family History  Problem Relation Age of Onset  . Hypertension Mother   . Heart disease Mother   . Heart disease Father   . Heart disease Sister   . Colon cancer Paternal Aunt 66  . Breast cancer Paternal Aunt        Age 75's  . Diabetes Sister   . Endometrial cancer Sister   . Breast cancer Cousin        Maternal 1st cousins-Age 28's  . Leukemia Paternal Aunt     SOCIAL HISTORY Social History   Tobacco Use  . Smoking status: Never Smoker  . Smokeless tobacco: Never Used  Vaping Use  . Vaping Use: Never used  Substance Use Topics  . Alcohol use: No    Comment: Rare  . Drug use: No         OPHTHALMIC EXAM:  Base Eye Exam    Visual Acuity (Snellen - Linear)      Right Left   Dist [place] 20/25 -2 20/25 -2   Dist ph [place] NI NI       Tonometry (Tonopen, 2:21 PM)      Right Left   Pressure 11 13       Pupils      Dark Light Shape React APD   Right 3 2 Round Brisk None   Left 3 2 Round Brisk None       Visual Fields (Counting fingers)      Left Right    Full Full       Extraocular Movement      Right Left     Full Full       Neuro/Psych    Oriented x3: Yes   Mood/Affect: Normal       Dilation  Both eyes: 1.0% Mydriacyl, 2.5% Phenylephrine @ 2:21 PM        Slit Lamp and Fundus Exam    Slit Lamp Exam      Right Left   Lids/Lashes mild Telangiectasia, marginal leasion nasal UL Telangiectasia, Meibomian gland dysfunction   Conjunctiva/Sclera White and quiet White and quiet   Cornea Arcus, 3+ Punctate epithelial erosions 3+ Punctate epithelial erosions, Arcus   Anterior Chamber Deep and clear Deep and clear   Iris Round and dilated Round and well dilated   Lens PC IOL in good position, trace Posterior capsular opacification (linear, extending just to visual axis) PC IOL in excellent position   Vitreous Vitreous syneresis, Posterior vitreous detachment Vitreous syneresis, Posterior vitreous detachment, vitreous condensations       Fundus Exam      Right Left   Disc Pink and Sharp, Compact Pink and Sharp, mild temporal PPA   C/D Ratio 0.2 0.3   Macula Blunted foveal reflex, Drusen, RPE mottling and clumping, early Atrophy, +PEDs--stably improved, stable improvement in central cyst / IRF, No heme Blunted foveal reflex, +drusen, pigment clumping, stable resolution of central edema, RPE mottling, clumping and early atrophy, no heme   Vessels Vascular attenuation Vascular attenuation   Periphery Attached, scattered reticular degeneration   Attached, scattered reticular degeneration          IMAGING AND PROCEDURES  Imaging and Procedures for @  OCT, Retina - OU - Both Eyes       Right Eye Quality was good. Central Foveal Thickness: 236. Progression has been stable. Findings include retinal drusen , pigment epithelial detachment, outer retinal atrophy, intraretinal hyper-reflective material, no SRF, no IRF, abnormal foveal contour (Stable improvement in PED; trace cystic changes; persistent ORA and IRHM).   Left Eye Quality was good. Central Foveal Thickness: 249.  Progression has been stable. Findings include retinal drusen , outer retinal atrophy, pigment epithelial detachment, intraretinal hyper-reflective material, normal foveal contour, no SRF, no IRF (Stable resolution of bullous PED and IRF/SRF -- now just central ORA; trace cystic changes).   Notes *Images captured and stored on drive  Diagnosis / Impression:  OD: exudative ARMD -- Stable improvement in PED; trace cystic changes; persistent ORA and IRHM OS: exu-ARMD -- stable resolution of bullous PED and IRF/SRF -- now just central ORA; trace cystic changes   Clinical management:  See below  Abbreviations: NFP - Normal foveal profile. CME - cystoid macular edema. PED - pigment epithelial detachment. IRF - intraretinal fluid. SRF - subretinal fluid. EZ - ellipsoid zone. ERM - epiretinal membrane. ORA - outer retinal atrophy. ORT - outer retinal tubulation. SRHM - subretinal hyper-reflective material        Intravitreal Injection, Pharmacologic Agent - OD - Right Eye       Time Out 07/01/2020. 3:42 PM. Confirmed correct patient, procedure, site, and patient consented.   Anesthesia Topical anesthesia was used. Anesthetic medications included Lidocaine 2%, Proparacaine 0.5%.   Procedure Preparation included 5% betadine to ocular surface, eyelid speculum. A supplied needle was used.   Injection:  1.25 mg Bevacizumab (AVASTIN) SOLN   NDC: 40981-191-47, Lot: [id number] , Expiration date: 07/30/2020   Route: Intravitreal, Site: Right Eye, Waste: 0 mL  Post-op Post injection exam found visual acuity of at least counting fingers. The patient tolerated the procedure well. There were no complications. The patient received written and verbal post procedure care education. Post injection medications were not given.  ASSESSMENT/PLAN:    ICD-10-CM   1. Exudative age-related macular degeneration of left eye with active choroidal neovascularization (HCC)  H35.3221   2.  Retinal edema  H35.81 OCT, Retina - OU - Both Eyes  3. Exudative age-related macular degeneration of right eye with active choroidal neovascularization (HCC)  H35.3211 Intravitreal Injection, Pharmacologic Agent - OD - Right Eye    Bevacizumab (AVASTIN) SOLN 1.25 mg  4. Essential hypertension  I10   5. Hypertensive retinopathy of both eyes  H35.033   6. Pseudophakia of both eyes  Z96.1   7. Dry eyes  H04.123     1,2. Exudative age-related macular degeneration, left eye.    - OCT 4.30.2020 showed interval conversion of OS from nonexudative ARMD to exu-ARMD with large dome-shaped PED with overlying IRF/SRF--stably improved  - s/p IVA OS #1 (04.30.20), #2 (05.28.20), #3 (06.26.20), #4 (08.03.20), #5 (09.11.20), #6 (10.19.20), #7 (11.23.20)  - today, stable resolution of PED w/ overlying IRF/SRF -- now just ORA  - BCVA 20/25  - benefits investigation for Eylea initiated 4.30.2020 -- approved for 2021  - FA 05.28.20 -- no active CNV OS, just staining  - recommend holding off on injection OS today - pt in agreement  - f/u in 10 wks -- DFE/OCT  3. Age related macular degeneration, exudative, OD  - interval development of IRF first noted on 09.11.20 -- conversion from nonexudative to exudative ARMD  - pt initially presented on 11.27.19 due to alert from [facility] home monitoring system for OD  - [facility] prescribed by Dr. Elmer Picker  - FA (5.28.2020) showed staining / window defect corresponding temporal RPE changes OU -- no active CNV OU  - S/P IVA OD #1 (09.11.20), #2 (10.19.20), #3 (11.23.20), #4 (01.07.21), #5 (2.19.21), #6 (04.02.21), #7 (05.28.21), #8 (07.23.21), #9 (09.24.21)  - benefits investigation for Eylea initiated 4.30.2020 -- approved for 2021  - OCT shows stable resolution of PED/IRF/SRF, +ORA  -- stable at 9 wk interval  - BCVA 20/25  - recommend IVA OD #10 today, 11.29.21 -- maintenance, w/ f/u in 10 wks  - pt wishes to proceed  - RBA of procedure discussed, questions answered  -  informed consent obtained  - see procedure note  - f/u 10 weeks -- DFE/OCT; possible injection; tx and ext as able  4,5. Hypertensive retinopathy OU  - discussed importance of tight BP control  - monitor  6. Pseudophakia OU  - s/p CE/IOL OU  - beautiful surgeries, doing well  - monitor  7. Dry eyes OU - recommend artificial tears and lubricating ointment as needed    Ophthalmic Meds Ordered this visit:  Meds ordered this encounter  Medications  . Bevacizumab (AVASTIN) SOLN 1.25 mg       Return in about 10 weeks (around 09/09/2020) for f/u exu ARMD OU, DFE, OCT.  There are no Patient Instructions on file for this visit.  This document serves as a record of services personally performed by Karie Chimera, MD, PhD. It was created on their behalf by Herby Abraham, COA, an ophthalmic technician. The creation of this record is the provider's dictation and/or activities during the visit.    Electronically signed by: Herby Abraham, COA 11.18.2021 10:52 PM   This document serves as a record of services personally performed by Karie Chimera, MD, PhD. It was created on their behalf by Glee Arvin. Manson Passey, OA an ophthalmic technician. The creation of this record is the provider's dictation and/or activities during the visit.  Electronically signed by: Glee ArvinAmanda J. Kristopher OppenheimBrown, OA 11.29.2021 10:52 PM  Karie ChimeraBrian G. Shamarra Warda, M.D., Ph.D. Diseases & Surgery of the Retina and Vitreous [facility] 07/01/2020   I have reviewed the above documentation for accuracy and completeness, and I agree with the above. Karie ChimeraBrian G. Mackayla Mullins, M.D., Ph.D. 07/01/20 10:52 PM   Abbreviations: M myopia (nearsighted); A astigmatism; H hyperopia (farsighted); P presbyopia; Mrx spectacle prescription;  CTL contact lenses; OD right eye; OS left eye; OU both eyes  XT exotropia; ET esotropia; PEK punctate epithelial keratitis; PEE punctate epithelial erosions; DES dry eye syndrome; MGD meibomian gland  dysfunction; ATs artificial tears; PFAT's preservative free artificial tears; NSC nuclear sclerotic cataract; PSC posterior subcapsular cataract; ERM epi-retinal membrane; PVD posterior vitreous detachment; RD retinal detachment; DM diabetes mellitus; DR diabetic retinopathy; NPDR non-proliferative diabetic retinopathy; PDR proliferative diabetic retinopathy; CSME clinically significant macular edema; DME diabetic macular edema; dbh dot blot hemorrhages; CWS cotton wool spot; POAG primary open angle glaucoma; C/D cup-to-disc ratio; HVF humphrey visual field; GVF goldmann visual field; OCT optical coherence tomography; IOP intraocular pressure; BRVO Branch retinal vein occlusion; CRVO central retinal vein occlusion; CRAO central retinal artery occlusion; BRAO branch retinal artery occlusion; RT retinal tear; SB scleral buckle; PPV pars plana vitrectomy; VH Vitreous hemorrhage; PRP panretinal laser photocoagulation; IVK intravitreal kenalog; VMT vitreomacular traction; MH Macular hole;  NVD neovascularization of the disc; NVE neovascularization elsewhere; AREDS age related eye disease study; ARMD age related macular degeneration; POAG primary open angle glaucoma; EBMD epithelial/anterior basement membrane dystrophy; ACIOL anterior chamber intraocular lens; IOL intraocular lens; PCIOL posterior chamber intraocular lens; Phaco/IOL phacoemulsification with intraocular lens placement; PRK photorefractive keratectomy; LASIK laser assisted in situ keratomileusis; HTN hypertension; DM diabetes mellitus; COPD chronic obstructive pulmonary disease

## 2020-06-21 ENCOUNTER — Other Ambulatory Visit: Payer: Self-pay | Admitting: Cardiology

## 2020-06-25 ENCOUNTER — Telehealth: Payer: Self-pay | Admitting: Family Medicine

## 2020-06-25 NOTE — Telephone Encounter (Signed)
Left message for patient to call back and schedule Medicare Annual Wellness Visit (AWV) either virtually or in office.  Last AWV 11/30/17; please schedule at anytime with [facility] Nurse Health Advisor 1 OR 2.  This should be a 45 minute visit.

## 2020-07-01 ENCOUNTER — Encounter (INDEPENDENT_AMBULATORY_CARE_PROVIDER_SITE_OTHER): Payer: Self-pay | Admitting: Ophthalmology

## 2020-07-01 ENCOUNTER — Ambulatory Visit (INDEPENDENT_AMBULATORY_CARE_PROVIDER_SITE_OTHER): Payer: Medicare Other | Admitting: Ophthalmology

## 2020-07-01 ENCOUNTER — Other Ambulatory Visit: Payer: Self-pay

## 2020-07-01 DIAGNOSIS — Z961 Presence of intraocular lens: Secondary | ICD-10-CM

## 2020-07-01 DIAGNOSIS — H3581 Retinal edema: Secondary | ICD-10-CM | POA: Diagnosis not present

## 2020-07-01 DIAGNOSIS — H353112 Nonexudative age-related macular degeneration, right eye, intermediate dry stage: Secondary | ICD-10-CM

## 2020-07-01 DIAGNOSIS — H353221 Exudative age-related macular degeneration, left eye, with active choroidal neovascularization: Secondary | ICD-10-CM

## 2020-07-01 DIAGNOSIS — H353211 Exudative age-related macular degeneration, right eye, with active choroidal neovascularization: Secondary | ICD-10-CM

## 2020-07-01 DIAGNOSIS — I10 Essential (primary) hypertension: Secondary | ICD-10-CM

## 2020-07-01 DIAGNOSIS — H35033 Hypertensive retinopathy, bilateral: Secondary | ICD-10-CM

## 2020-07-01 DIAGNOSIS — H353132 Nonexudative age-related macular degeneration, bilateral, intermediate dry stage: Secondary | ICD-10-CM

## 2020-07-01 DIAGNOSIS — H04123 Dry eye syndrome of bilateral lacrimal glands: Secondary | ICD-10-CM

## 2020-07-01 MED ORDER — BEVACIZUMAB CHEMO INJECTION 1.25MG/0.05ML SYRINGE FOR KALEIDOSCOPE
1.2500 mg | INTRAVITREAL | Status: AC | PRN
  Administered 2020-07-01: 1.25 mg via INTRAVITREAL

## 2020-08-20 NOTE — Addendum Note (Signed)
Addended by: Lerry Liner on: 08/20/2020 01:44 PM   Modules accepted: Orders

## 2020-08-23 ENCOUNTER — Other Ambulatory Visit: Payer: Self-pay | Admitting: Family Medicine

## 2020-08-23 DIAGNOSIS — I10 Essential (primary) hypertension: Secondary | ICD-10-CM

## 2020-08-23 DIAGNOSIS — R7309 Other abnormal glucose: Secondary | ICD-10-CM

## 2020-08-23 DIAGNOSIS — E785 Hyperlipidemia, unspecified: Secondary | ICD-10-CM

## 2020-08-23 DIAGNOSIS — H04129 Dry eye syndrome of unspecified lacrimal gland: Secondary | ICD-10-CM

## 2020-08-23 DIAGNOSIS — E039 Hypothyroidism, unspecified: Secondary | ICD-10-CM

## 2020-08-27 ENCOUNTER — Telehealth: Payer: Self-pay | Admitting: *Deleted

## 2020-08-27 NOTE — Telephone Encounter (Signed)
Left a detailed message at the pts home number with the information below. 

## 2020-08-27 NOTE — Telephone Encounter (Signed)
-----   Message from Wynn Banker, MD sent at 08/23/2020  9:05 AM EST ----- Regarding: labs I see she has upcoming lab visit. I added some bloodwork requested by her eye doctor looking for reason for her dry mouth/eye (they reached out to me). Just wanted her to know that I had received the specialty note and we can forward any results we get to Dr. Mateo Flow.

## 2020-08-28 NOTE — Addendum Note (Signed)
Addended by: Lerry Liner on: 08/28/2020 03:10 PM   Modules accepted: Orders

## 2020-09-02 ENCOUNTER — Other Ambulatory Visit (INDEPENDENT_AMBULATORY_CARE_PROVIDER_SITE_OTHER): Payer: Medicare Other

## 2020-09-02 ENCOUNTER — Other Ambulatory Visit: Payer: Self-pay

## 2020-09-02 DIAGNOSIS — R7309 Other abnormal glucose: Secondary | ICD-10-CM | POA: Diagnosis not present

## 2020-09-02 DIAGNOSIS — H04129 Dry eye syndrome of unspecified lacrimal gland: Secondary | ICD-10-CM

## 2020-09-02 DIAGNOSIS — E039 Hypothyroidism, unspecified: Secondary | ICD-10-CM

## 2020-09-02 DIAGNOSIS — I10 Essential (primary) hypertension: Secondary | ICD-10-CM

## 2020-09-02 DIAGNOSIS — E785 Hyperlipidemia, unspecified: Secondary | ICD-10-CM

## 2020-09-02 LAB — LIPID PANEL
Cholesterol: 165 mg/dL (ref 0–200)
HDL: 61.7 mg/dL (ref >39.00)
LDL Cholesterol: 74 mg/dL (ref 0–99)
NonHDL: 103.02
Total CHOL/HDL Ratio: 3
Triglycerides: 143 mg/dL (ref 0.0–149.0)
VLDL: 28.6 mg/dL (ref 0.0–40.0)

## 2020-09-02 LAB — COMPREHENSIVE METABOLIC PANEL
ALT: 16 U/L (ref 0–35)
AST: 20 U/L (ref 0–37)
Albumin: 4 g/dL (ref 3.5–5.2)
Alkaline Phosphatase: 62 U/L (ref 39–117)
BUN: 18 mg/dL (ref 6–23)
CO2: 28 mEq/L (ref 19–32)
Calcium: 9.3 mg/dL (ref 8.4–10.5)
Chloride: 105 mEq/L (ref 96–112)
Creatinine, Ser: 0.76 mg/dL (ref 0.40–1.20)
GFR: 72.45 mL/min (ref >60.00)
Glucose, Bld: 97 mg/dL (ref 70–99)
Potassium: 4.1 mEq/L (ref 3.5–5.1)
Sodium: 140 mEq/L (ref 135–145)
Total Bilirubin: 0.5 mg/dL (ref 0.2–1.2)
Total Protein: 6.2 g/dL (ref 6.0–8.3)

## 2020-09-02 LAB — CBC WITH DIFFERENTIAL/PLATELET
Basophils Absolute: 0.1 10*3/uL (ref 0.0–0.1)
Basophils Relative: 1.2 % (ref 0.0–3.0)
Eosinophils Absolute: 0.1 10*3/uL (ref 0.0–0.7)
Eosinophils Relative: 2.4 % (ref 0.0–5.0)
HCT: 41.1 % (ref 36.0–46.0)
Hemoglobin: 13.8 g/dL (ref 12.0–15.0)
Lymphocytes Relative: 20.9 % (ref 12.0–46.0)
Lymphs Abs: 0.9 10*3/uL (ref 0.7–4.0)
MCHC: 33.6 g/dL (ref 30.0–36.0)
MCV: 91.1 fl (ref 78.0–100.0)
Monocytes Absolute: 0.4 10*3/uL (ref 0.1–1.0)
Monocytes Relative: 9.9 % (ref 3.0–12.0)
Neutro Abs: 2.8 10*3/uL (ref 1.4–7.7)
Neutrophils Relative %: 65.6 % (ref 43.0–77.0)
Platelets: 259 10*3/uL (ref 150.0–400.0)
RBC: 4.51 Mil/uL (ref 3.87–5.11)
RDW: 13.5 % (ref 11.5–15.5)
WBC: 4.2 10*3/uL (ref 4.0–10.5)

## 2020-09-02 LAB — TSH: TSH: 3.25 u[IU]/mL (ref 0.35–4.50)

## 2020-09-02 LAB — HEMOGLOBIN A1C: Hgb A1c MFr Bld: 5.7 % (ref 4.6–6.5)

## 2020-09-03 LAB — SJOGREN'S SYNDROME ANTIBODS(SSA + SSB)
SSA (Ro) (ENA) Antibody, IgG: <1 AI
SSB (La) (ENA) Antibody, IgG: <1 AI

## 2020-09-03 LAB — ANA: Anti Nuclear Antibody (ANA): NEGATIVE

## 2020-09-03 NOTE — Progress Notes (Shared)
[facility] - Clinic Note  09/09/2020     CHIEF COMPLAINT Patient presents for No chief complaint on file.   HISTORY OF PRESENT ILLNESS: Kaitlyn Parks is a 86 y.o. female who presents to the clinic today for:   pt states her eyes seems stable, but they seem like there is a "constant fuzziness" and they are never totally clear, she states she has a hard time seeing street signs, pt saw Dr. Krista Blue a few months ago and was able to get new reading glasses, she goes back to see Elmer Picker on Wednesday   Referring physician: Wynn Banker, MD [address] [place],  [place] [address]  HISTORICAL INFORMATION:   Selected notes from the MEDICAL RECORD NUMBER Referred by Dr. [place] DeMarco for concern of exu ARMD LEE: 11.26.19 (J. 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 (Ophthalmic Drugs)   No current facility-administered medications for this visit. (Ophthalmic Drugs)   Current Outpatient Medications (Other)  Medication Sig  . aspirin EC 81 MG tablet Take 81 mg by mouth daily.  . Biotin w/ Vitamins C & E (HAIR/SKIN/NAILS PO) Take 1 tablet by mouth daily.  . hydrALAZINE (APRESOLINE) 10 MG tablet 10 mg twice a day, may take extra dose for SBP >160 up to a total of 4 times a day  . isosorbide mononitrate (IMDUR) 60 MG 24 hr tablet Take 1.5 tablets (90 mg total) by mouth daily.  Marland Kitchen levothyroxine (SYNTHROID) 50 MCG tablet TAKE 1 TABLET EACH DAY.  . Multiple Vitamins-Calcium (ONE-A-DAY WOMENS PO) Take 1 tablet by mouth daily.   . Multiple Vitamins-Minerals (PRESERVISION AREDS 2 PO) Take 1 capsule by mouth 2 (two) times daily.  . nitroGLYCERIN (NITROSTAT) 0.4 MG SL tablet DISSOLVE 1 TABLET UNDER TONGUE IF NEEDED FOR CHEST PAIN. MAY REPEAT IN 5 MINUTES FOR 3 DOSES.  Marland Kitchen PRESCRIPTION MEDICATION Avastin eye  injections given by Dr Vanessa Barbara due to macular degeneration  . rosuvastatin (CRESTOR) 10 MG tablet TAKE (1/2) TABLET DAILY.   No current facility-administered medications for this visit. (Other)      REVIEW OF SYSTEMS:    ALLERGIES Allergies  Allergen Reactions  . Acyclovir And Related Other (See Comments)    unknown  . Pravastatin Sodium Other (See Comments)    cystitis  . Zocor [Simvastatin] Other (See Comments)    cystitis    PAST MEDICAL HISTORY Past Medical History:  Diagnosis Date  . Arthritis    DDD, scoliosis, sees Dr. Lovell Sheehan for this, uses norco very rarely for pain  . Bradycardia 11/11/2017  . CAD (coronary artery disease)    LAD stenting of a 90% lesion 2012  . Cystocele   . Educated about COVID-19 virus infection 12/06/2019  . Elevated cholesterol   . GERD (gastroesophageal reflux disease)    dx in work up 2016 for atypical CP at OSH   pt. denies  . Heart murmur   . Hypertensive retinopathy    OU  . Hypothyroidism   . Interstitial cystitis    sees Dr. [name]  . Macular degeneration    OU  . NSTEMI (non-ST elevated myocardial infarction) (HCC)   . Osteoporosis   . Rectocele   . 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 Scoliosis   . Thyroid disease    Hypothyroid  . Urinary incontinence    USI  .  Uterine prolapse    Past Surgical History:  Procedure Laterality Date  . BLADDER SUSPENSION  2011  . CATARACT EXTRACTION Bilateral 2015   Dr. Elmer Picker  . CORONARY ANGIOPLASTY WITH STENT PLACEMENT  04/21/2010   LAD 80%, RCA 30%, nl EF, s/p DES LAD  . EYE SURGERY    . LEFT HEART CATH AND CORONARY ANGIOGRAPHY N/A 06/14/2017   Procedure: LEFT HEART CATH AND CORONARY ANGIOGRAPHY;  Surgeon: Runell Gess, MD;  Location: [facility];  Service: Cardiovascular;  Laterality: N/A;  . OOPHORECTOMY  2011   BSO  . TOTAL HIP ARTHROPLASTY Right 06/10/2017   Procedure: RIGHT TOTAL HIP ARTHROPLASTY ANTERIOR APPROACH;  Surgeon: Samson Frederic, MD;  Location: [facility]  ORS;  Service: Orthopedics;  Laterality: Right;  Needs RNFA  . VAGINAL HYSTERECTOMY  2011   [facility] BSO; benign    FAMILY HISTORY Family History  Problem Relation Age of Onset  . Hypertension Mother   . Heart disease Mother   . Heart disease Father   . Heart disease Sister   . Colon cancer Paternal Aunt 11  . Breast cancer Paternal Aunt        Age 58's  . Diabetes Sister   . Endometrial cancer Sister   . Breast cancer Cousin        Maternal 1st cousins-Age 25's  . Leukemia Paternal Aunt     SOCIAL HISTORY Social History   Tobacco Use  . Smoking status: Never Smoker  . Smokeless tobacco: Never Used  Vaping Use  . Vaping Use: Never used  Substance Use Topics  . Alcohol use: No    Comment: Rare  . Drug use: No         OPHTHALMIC EXAM:  Not recorded     IMAGING AND PROCEDURES  Imaging and Procedures for @TODAY @           ASSESSMENT/PLAN:    ICD-10-CM   1. Exudative age-related macular degeneration of left eye with active choroidal neovascularization (HCC)  H35.3221   2. Retinal edema  H35.81   3. Exudative age-related macular degeneration of right eye with active choroidal neovascularization (HCC)  H35.3211   4. Essential hypertension  I10   5. Hypertensive retinopathy of both eyes  H35.033   6. Pseudophakia of both eyes  Z96.1   7. Dry eyes  H04.123   8. Intermediate stage nonexudative age-related macular degeneration of right eye  H35.3112   9. Intermediate stage nonexudative age-related macular degeneration of both eyes  H35.3132     1,2. Exudative age-related macular degeneration, left eye.    - OCT 4.30.2020 showed interval conversion of OS from nonexudative ARMD to exu-ARMD with large dome-shaped PED with overlying IRF/SRF--stably improved  - s/p IVA OS #1 (04.30.20), #2 (05.28.20), #3 (06.26.20), #4 (08.03.20), #5 (09.11.20), #6 (10.19.20), #7 (11.23.20)  - today, stable resolution of PED w/ overlying IRF/SRF -- now just ORA  - BCVA 20/25  -  benefits investigation for [employer] initiated 4.30.2020 -- approved for 2021  - FA 05.28.20 -- no active CNV OS, just staining  - recommend holding off on injection OS today - pt in agreement  - f/u in 10 wks -- DFE/OCT  3. Age related macular degeneration, exudative, OD  - interval development of IRF first noted on 09.11.20 -- conversion from nonexudative to exudative ARMD  - pt initially presented on 11.27.19 due to alert from [facility] home monitoring system for OD  - [facility] prescribed by Dr. 11.29.19  - FA (5.28.2020)  showed staining / window defect corresponding temporal RPE changes OU -- no active CNV OU  - S/P IVA OD #1 (09.11.20), #2 (10.19.20), #3 (11.23.20), #4 (01.07.21), #5 (2.19.21), #6 (04.02.21), #7 (05.28.21), #8 (07.23.21), #9 (09.24.21), #1 (11.29.21)  - benefits investigation for [employer] initiated 4.30.2020 -- approved for 2021  - OCT shows stable resolution of PED/IRF/SRF, +ORA  -- stable at 9 wk interval  - BCVA 20/25  - recommend IVA OD #11 today, 02.07.22 -- maintenance, w/ f/u in 10 wks  - pt wishes to proceed  - RBA of procedure discussed, questions answered  - informed consent obtained  - see procedure note  - f/u 10 weeks -- DFE/OCT; possible injection; tx and ext as able  4,5. Hypertensive retinopathy OU  - discussed importance of tight BP control  - monitor  6. Pseudophakia OU  - s/p CE/IOL OU  - beautiful surgeries, doing well  - monitor  7. Dry eyes OU - recommend artificial tears and lubricating ointment as needed    Ophthalmic Meds Ordered this visit:  No orders of the defined types were placed in this encounter.      No follow-ups on file.  There are no Patient Instructions on file for this visit.  This document serves as a record of services personally performed by Karie Chimera, MD, PhD. It was created on their behalf by Herby Abraham, COA, an ophthalmic technician. The creation of this record is the provider's dictation and/or activities  during the visit.    Electronically signed by: Herby Abraham, COA @TODAY @ 8:49 AM   Abbreviations: M myopia (nearsighted); A astigmatism; H hyperopia (farsighted); P presbyopia; Mrx spectacle prescription;  CTL contact lenses; OD right eye; OS left eye; OU both eyes  XT exotropia; ET esotropia; PEK punctate epithelial keratitis; PEE punctate epithelial erosions; DES dry eye syndrome; MGD meibomian gland dysfunction; ATs artificial tears; PFAT's preservative free artificial tears; NSC nuclear sclerotic cataract; PSC posterior subcapsular cataract; ERM epi-retinal membrane; PVD posterior vitreous detachment; RD retinal detachment; DM diabetes mellitus; DR diabetic retinopathy; NPDR non-proliferative diabetic retinopathy; PDR proliferative diabetic retinopathy; CSME clinically significant macular edema; DME diabetic macular edema; dbh dot blot hemorrhages; CWS cotton wool spot; POAG primary open angle glaucoma; C/D cup-to-disc ratio; HVF humphrey visual field; GVF goldmann visual field; OCT optical coherence tomography; IOP intraocular pressure; BRVO Branch retinal vein occlusion; CRVO central retinal vein occlusion; CRAO central retinal artery occlusion; BRAO branch retinal artery occlusion; RT retinal tear; SB scleral buckle; PPV pars plana vitrectomy; VH Vitreous hemorrhage; PRP panretinal laser photocoagulation; IVK intravitreal kenalog; VMT vitreomacular traction; MH Macular hole;  NVD neovascularization of the disc; NVE neovascularization elsewhere; AREDS age related eye disease study; ARMD age related macular degeneration; POAG primary open angle glaucoma; EBMD epithelial/anterior basement membrane dystrophy; ACIOL anterior chamber intraocular lens; IOL intraocular lens; PCIOL posterior chamber intraocular lens; Phaco/IOL phacoemulsification with intraocular lens placement; PRK photorefractive keratectomy; LASIK laser assisted in situ keratomileusis; HTN hypertension; DM diabetes mellitus; COPD chronic  obstructive pulmonary disease

## 2020-09-09 ENCOUNTER — Encounter (INDEPENDENT_AMBULATORY_CARE_PROVIDER_SITE_OTHER): Payer: Medicare Other | Admitting: Ophthalmology

## 2020-09-09 DIAGNOSIS — Z961 Presence of intraocular lens: Secondary | ICD-10-CM

## 2020-09-09 DIAGNOSIS — H353221 Exudative age-related macular degeneration, left eye, with active choroidal neovascularization: Secondary | ICD-10-CM

## 2020-09-09 DIAGNOSIS — H353211 Exudative age-related macular degeneration, right eye, with active choroidal neovascularization: Secondary | ICD-10-CM

## 2020-09-09 DIAGNOSIS — H35033 Hypertensive retinopathy, bilateral: Secondary | ICD-10-CM

## 2020-09-09 DIAGNOSIS — H04123 Dry eye syndrome of bilateral lacrimal glands: Secondary | ICD-10-CM

## 2020-09-09 DIAGNOSIS — H353132 Nonexudative age-related macular degeneration, bilateral, intermediate dry stage: Secondary | ICD-10-CM

## 2020-09-09 DIAGNOSIS — H3581 Retinal edema: Secondary | ICD-10-CM

## 2020-09-09 DIAGNOSIS — I10 Essential (primary) hypertension: Secondary | ICD-10-CM

## 2020-09-09 DIAGNOSIS — H353112 Nonexudative age-related macular degeneration, right eye, intermediate dry stage: Secondary | ICD-10-CM

## 2020-09-09 NOTE — Progress Notes (Addendum)
[facility] - Clinic Note  09/13/2020     CHIEF COMPLAINT Patient presents for Retina Follow Up   HISTORY OF PRESENT ILLNESS: Kaitlyn Parks is a 86 y.o. female who presents to the clinic today for:   HPI    Retina Follow Up    Patient presents with  Wet AMD.  In both eyes.  Severity is moderate.  Duration of 10 weeks.  Since onset it is stable.  I, the attending physician,  performed the HPI with the patient and updated documentation appropriately.          Comments    10 week retina follow up for wet armd both eyes. Patient states vision still blurred.       Last edited by Rennis Chris, MD on 09/13/2020  2:15 PM. (History)    pt states    Referring physician: Wynn Banker, MD [address] [place],  [place] [address]  HISTORICAL INFORMATION:   Selected notes from the MEDICAL RECORD NUMBER Referred by Dr. [place] DeMarco for concern of exu ARMD LEE: 11.26.19 (J. 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 (Ophthalmic Drugs)   No current facility-administered medications for this visit. (Ophthalmic Drugs)   Current Outpatient Medications (Other)  Medication Sig  . aspirin EC 81 MG tablet Take 81 mg by mouth daily.  . Biotin w/ Vitamins C & E (HAIR/SKIN/NAILS PO) Take 1 tablet by mouth daily.  . hydrALAZINE (APRESOLINE) 10 MG tablet 10 mg twice a day, may take extra dose for SBP >160 up to a total of 4 times a day  . isosorbide mononitrate (IMDUR) 60 MG 24 hr tablet TAKE 1 & 1/2 TABLETS A DAY.  Marland Kitchen levothyroxine (SYNTHROID) 50 MCG tablet TAKE 1 TABLET EACH DAY.  . Multiple Vitamins-Calcium (ONE-A-DAY WOMENS PO) Take 1 tablet by mouth daily.   . Multiple Vitamins-Minerals (PRESERVISION AREDS 2 PO) Take 1 capsule by mouth 2 (two) times daily.  . nitroGLYCERIN (NITROSTAT) 0.4 MG SL  tablet DISSOLVE 1 TABLET UNDER TONGUE IF NEEDED FOR CHEST PAIN. MAY REPEAT IN 5 MINUTES FOR 3 DOSES.  Marland Kitchen PRESCRIPTION MEDICATION Avastin eye injections given by Dr Vanessa Barbara due to macular degeneration  . rosuvastatin (CRESTOR) 10 MG tablet TAKE (1/2) TABLET DAILY.  Marland Kitchen sulfamethoxazole-trimethoprim (BACTRIM DS) 800-160 MG tablet Take 1 tablet by mouth 2 (two) times daily.   No current facility-administered medications for this visit. (Other)      REVIEW OF SYSTEMS: ROS    Positive for: Gastrointestinal, Neurological, Musculoskeletal, Cardiovascular, Eyes   Negative for: Constitutional, Skin, Genitourinary, HENT, Endocrine, Respiratory, Psychiatric, Allergic/Imm, Heme/Lymph   Last edited by Lana Fish, COT on 09/13/2020  1:36 PM. (History)       ALLERGIES Allergies  Allergen Reactions  . Acyclovir And Related Other (See Comments)    unknown  . Pravastatin Sodium Other (See Comments)    cystitis  . Zocor [Simvastatin] Other (See Comments)    cystitis    PAST MEDICAL HISTORY Past Medical History:  Diagnosis Date  . Arthritis    DDD, scoliosis, sees Dr. Lovell Sheehan for this, uses norco very rarely for pain  . Bradycardia 11/11/2017  . CAD (coronary artery disease)    LAD stenting of a 90% lesion 2012  . Cystocele   . Educated about COVID-19 virus infection 12/06/2019  . Elevated cholesterol   .  GERD (gastroesophageal reflux disease)    dx in work up 2016 for atypical CP at OSH   pt. denies  . Heart murmur   . Hypertensive retinopathy    OU  . Hypothyroidism   . Interstitial cystitis    sees Dr. [name]  . Macular degeneration    OU  . NSTEMI (non-ST elevated myocardial infarction) (HCC)   . Osteoporosis   . Rectocele   . 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 Scoliosis   . Thyroid disease    Hypothyroid  . Urinary incontinence    USI  . Uterine prolapse    Past Surgical History:  Procedure Laterality Date  . BLADDER SUSPENSION  2011  . CATARACT EXTRACTION Bilateral  2015   Dr. Elmer Picker  . CORONARY ANGIOPLASTY WITH STENT PLACEMENT  04/21/2010   LAD 80%, RCA 30%, nl EF, s/p DES LAD  . EYE SURGERY    . LEFT HEART CATH AND CORONARY ANGIOGRAPHY N/A 06/14/2017   Procedure: LEFT HEART CATH AND CORONARY ANGIOGRAPHY;  Surgeon: Runell Gess, MD;  Location: [facility];  Service: Cardiovascular;  Laterality: N/A;  . OOPHORECTOMY  2011   BSO  . TOTAL HIP ARTHROPLASTY Right 06/10/2017   Procedure: RIGHT TOTAL HIP ARTHROPLASTY ANTERIOR APPROACH;  Surgeon: Samson Frederic, MD;  Location: [facility];  Service: Orthopedics;  Laterality: Right;  Needs RNFA  . VAGINAL HYSTERECTOMY  2011   LAVH BSO; benign    FAMILY HISTORY Family History  Problem Relation Age of Onset  . Hypertension Mother   . Heart disease Mother   . Heart disease Father   . Heart disease Sister   . Colon cancer Paternal Aunt 45  . Breast cancer Paternal Aunt        Age 86's  . Diabetes Sister   . Endometrial cancer Sister   . Breast cancer Cousin        Maternal 1st cousins-Age 30's  . Leukemia Paternal Aunt     SOCIAL HISTORY Social History   Tobacco Use  . Smoking status: Never Smoker  . Smokeless tobacco: Never Used  Vaping Use  . Vaping Use: Never used  Substance Use Topics  . Alcohol use: No    Comment: Rare  . Drug use: No         OPHTHALMIC EXAM:  Base Eye Exam    Visual Acuity (Snellen - Linear)      Right Left   Dist [place] 20/30-1 20/25-2   Dist ph [place] 20/25-1 20/NI       Tonometry (Tonopen, 1:38 PM)      Right Left   Pressure 14 15       Pupils      Dark Light Shape React APD   Right 3 2 Round Brisk None   Left 3 2 Round Brisk None       Visual Fields (Counting fingers)      Left Right    Full Full       Extraocular Movement      Right Left    Full, Ortho Full, Ortho       Neuro/Psych    Oriented x3: Yes   Mood/Affect: Normal       Dilation    Both eyes: 1.0% Mydriacyl, 2.5% Phenylephrine @ 1:38 PM        Slit Lamp and Fundus  Exam    Slit Lamp Exam      Right Left   Lids/Lashes mild Telangiectasia, marginal leasion nasal  UL Telangiectasia, Meibomian gland dysfunction   Conjunctiva/Sclera White and quiet White and quiet   Cornea Arcus, 3+ Punctate epithelial erosions 3+ Punctate epithelial erosions, Arcus   Anterior Chamber Deep and clear Deep and clear   Iris Round and dilated Round and well dilated   Lens PC IOL in good position, trace Posterior capsular opacification (linear, extending just to visual axis) PC IOL in excellent position   Vitreous Vitreous syneresis, Posterior vitreous detachment Vitreous syneresis, Posterior vitreous detachment, vitreous condensations       Fundus Exam      Right Left   Disc Pink and Sharp, Compact Pink and Sharp, mild temporal PPA   C/D Ratio 0.2 0.3   Macula Blunted foveal reflex, Drusen, RPE mottling and clumping, early Atrophy, +PEDs -- stably improved, stable improvement in central cyst / IRF, No heme Blunted foveal reflex, +drusen, pigment clumping, trace cystic changes, RPE mottling, clumping and early atrophy, no heme   Vessels Vascular attenuation Vascular attenuation   Periphery Attached, scattered reticular degeneration   Attached, scattered reticular degeneration          IMAGING AND PROCEDURES  Imaging and Procedures for @TODAY @  OCT, Retina - OU - Both Eyes       Right Eye Quality was good. Central Foveal Thickness: 241. Progression has been stable. Findings include retinal drusen , pigment epithelial detachment, outer retinal atrophy, intraretinal hyper-reflective material, no SRF, no IRF, abnormal foveal contour (Stable improvement in PED; trace cystic changes; persistent ORA and IRHM).   Left Eye Quality was good. Central Foveal Thickness: 251. Progression has been stable. Findings include retinal drusen , outer retinal atrophy, pigment epithelial detachment, intraretinal hyper-reflective material, normal foveal contour, no SRF, no IRF (Stable  resolution of bullous PED and IRF/SRF -- now just central ORA; trace cystic changes -- slightly increased).   Notes *Images captured and stored on drive  Diagnosis / Impression:  OD: exudative ARMD -- Stable improvement in PED; trace cystic changes; persistent ORA and IRHM OS: exu-ARMD -- stable resolution of bullous PED and IRF/SRF -- now just central ORA; trace cystic changes -- slightly increased   Clinical management:  See below  Abbreviations: NFP - Normal foveal profile. CME - cystoid macular edema. PED - pigment epithelial detachment. IRF - intraretinal fluid. SRF - subretinal fluid. EZ - ellipsoid zone. ERM - epiretinal membrane. ORA - outer retinal atrophy. ORT - outer retinal tubulation. SRHM - subretinal hyper-reflective material        Intravitreal Injection, Pharmacologic Agent - OD - Right Eye       Time Out 09/13/2020. 1:59 PM. Confirmed correct patient, procedure, site, and patient consented.   Anesthesia Topical anesthesia was used. Anesthetic medications included Lidocaine 2%, Proparacaine 0.5%.   Procedure Preparation included 5% betadine to ocular surface, eyelid speculum. A (32g) needle was used.   Injection:  1.25 mg Bevacizumab (AVASTIN) 1.25mg /0.5mL SOLN   NDC: 27253-664-40, Lot: [id number], Expiration date: 11/07/2020   Route: Intravitreal, Site: Right Eye, Waste: 0.05 mL  Post-op Post injection exam found visual acuity of at least counting fingers. The patient tolerated the procedure well. There were no complications. The patient received written and verbal post procedure care education. Post injection medications were not given.                 ASSESSMENT/PLAN:    ICD-10-CM   1. Exudative age-related macular degeneration of left eye with active choroidal neovascularization (HCC)  H35.3221   2. Retinal edema  H35.81 OCT, Retina -  OU - Both Eyes  3. Exudative age-related macular degeneration of right eye with active choroidal neovascularization  (HCC)  H35.3211 Intravitreal Injection, Pharmacologic Agent - OD - Right Eye    Bevacizumab (AVASTIN) SOLN 1.25 mg  4. Essential hypertension  I10   5. Hypertensive retinopathy of both eyes  H35.033   6. Pseudophakia of both eyes  Z96.1   7. Dry eyes  H04.123     1,2. Exudative age-related macular degeneration, left eye.    - OCT 4.30.2020 showed interval conversion of OS from nonexudative ARMD to exu-ARMD with large dome-shaped PED with overlying IRF/SRF--stably improved  - s/p IVA OS #1 (04.30.20), #2 (05.28.20), #3 (06.26.20), #4 (08.03.20), #5 (09.11.20), #6 (10.19.20), #7 (11.23.20)  - today, stable resolution of PED w/ overlying IRF/SRF -- now just ORA  - BCVA 20/25  - benefits investigation for Eylea initiated 4.30.2020 -- approved for 2022  - FA 05.28.20 -- no active CNV OS, just staining  - recommend holding off on injection OS today - pt in agreement  - f/u in 11 wks -- DFE/OCT  3. Age related macular degeneration, exudative, OD  - interval development of IRF first noted on 09.11.20 -- conversion from nonexudative to exudative ARMD  - pt initially presented on 11.27.19 due to alert from [facility] home monitoring system for OD  - [facility] prescribed by Dr. Elmer Picker  - FA (5.28.2020) showed staining / window defect corresponding temporal RPE changes OU -- no active CNV OU  - S/P IVA OD #1 (09.11.20), #2 (10.19.20), #3 (11.23.20), #4 (01.07.21), #5 (2.19.21), #6 (04.02.21), #7 (05.28.21), #8 (07.23.21), #9 (09.24.21), #10 (11.29.21)  - benefits investigation for Eylea initiated 4.30.2020 -- approved for 2021  - OCT shows stable resolution of PED/IRF/SRF, +ORA  -- stable at 10 wk interval  - BCVA 20/25  - recommend IVA OD #11 today, 02.11.22 -- maintenance, w/ f/u in 11 wks  - pt wishes to proceed  - RBA of procedure discussed, questions answered  - informed consent obtained  - see procedure note  - f/u 11 weeks -- DFE/OCT; possible injection; tx and ext as able  4,5. Hypertensive  retinopathy OU  - discussed importance of tight BP control  - monitor  6. Pseudophakia OU  - s/p CE/IOL OU  - beautiful surgeries, doing well  - monitor  7. Dry eyes OU - recommend artificial tears and lubricating ointment as needed    Ophthalmic Meds Ordered this visit:  Meds ordered this encounter  Medications  . Bevacizumab (AVASTIN) SOLN 1.25 mg       Return in about 11 weeks (around 11/29/2020) for f/u exu ARMD OU, DFE, OCT.  There are no Patient Instructions on file for this visit.  This document serves as a record of services personally performed by Karie Chimera, MD, PhD. It was created on their behalf by Herby Abraham, COA, an ophthalmic technician. The creation of this record is the provider's dictation and/or activities during the visit.    This document serves as a record of services personally performed by Karie Chimera, MD, PhD. It was created on their behalf by Glee Arvin. Manson Passey, OA an ophthalmic technician. The creation of this record is the provider's dictation and/or activities during the visit.    Electronically signed by: Glee Arvin. Manson Passey, [place] 02.07.2022 2:21 PM  Karie Chimera, M.D., Ph.D. Diseases & Surgery of the Retina and Vitreous [facility]  I have reviewed the above documentation for accuracy and  completeness, and I agree with the above. Karie ChimeraBrian G. Khaleb Broz, M.D., Ph.D. 09/13/20 2:21 PM  Abbreviations: M myopia (nearsighted); A astigmatism; H hyperopia (farsighted); P presbyopia; Mrx spectacle prescription;  CTL contact lenses; OD right eye; OS left eye; OU both eyes  XT exotropia; ET esotropia; PEK punctate epithelial keratitis; PEE punctate epithelial erosions; DES dry eye syndrome; MGD meibomian gland dysfunction; ATs artificial tears; PFAT's preservative free artificial tears; NSC nuclear sclerotic cataract; PSC posterior subcapsular cataract; ERM epi-retinal membrane; PVD posterior vitreous detachment; RD retinal detachment;  DM diabetes mellitus; DR diabetic retinopathy; NPDR non-proliferative diabetic retinopathy; PDR proliferative diabetic retinopathy; CSME clinically significant macular edema; DME diabetic macular edema; dbh dot blot hemorrhages; CWS cotton wool spot; POAG primary open angle glaucoma; C/D cup-to-disc ratio; HVF humphrey visual field; GVF goldmann visual field; OCT optical coherence tomography; IOP intraocular pressure; BRVO Branch retinal vein occlusion; CRVO central retinal vein occlusion; CRAO central retinal artery occlusion; BRAO branch retinal artery occlusion; RT retinal tear; SB scleral buckle; PPV pars plana vitrectomy; VH Vitreous hemorrhage; PRP panretinal laser photocoagulation; IVK intravitreal kenalog; VMT vitreomacular traction; MH Macular hole;  NVD neovascularization of the disc; NVE neovascularization elsewhere; AREDS age related eye disease study; ARMD age related macular degeneration; POAG primary open angle glaucoma; EBMD epithelial/anterior basement membrane dystrophy; ACIOL anterior chamber intraocular lens; IOL intraocular lens; PCIOL posterior chamber intraocular lens; Phaco/IOL phacoemulsification with intraocular lens placement; PRK photorefractive keratectomy; LASIK laser assisted in situ keratomileusis; HTN hypertension; DM diabetes mellitus; COPD chronic obstructive pulmonary disease

## 2020-09-10 ENCOUNTER — Other Ambulatory Visit: Payer: Self-pay | Admitting: Family Medicine

## 2020-09-10 DIAGNOSIS — I10 Essential (primary) hypertension: Secondary | ICD-10-CM

## 2020-09-11 ENCOUNTER — Other Ambulatory Visit: Payer: Self-pay

## 2020-09-11 ENCOUNTER — Encounter: Payer: Self-pay | Admitting: Family Medicine

## 2020-09-11 ENCOUNTER — Ambulatory Visit (INDEPENDENT_AMBULATORY_CARE_PROVIDER_SITE_OTHER): Payer: Medicare Other | Admitting: Family Medicine

## 2020-09-11 VITALS — BP 100/60 | HR 51 | Temp 97.6°F | Ht 63.75 in | Wt 131.9 lb

## 2020-09-11 DIAGNOSIS — I2510 Atherosclerotic heart disease of native coronary artery without angina pectoris: Secondary | ICD-10-CM

## 2020-09-11 DIAGNOSIS — E039 Hypothyroidism, unspecified: Secondary | ICD-10-CM | POA: Diagnosis not present

## 2020-09-11 DIAGNOSIS — Z0000 Encounter for general adult medical examination without abnormal findings: Secondary | ICD-10-CM

## 2020-09-11 DIAGNOSIS — E785 Hyperlipidemia, unspecified: Secondary | ICD-10-CM | POA: Diagnosis not present

## 2020-09-11 DIAGNOSIS — R300 Dysuria: Secondary | ICD-10-CM | POA: Diagnosis not present

## 2020-09-11 DIAGNOSIS — H353211 Exudative age-related macular degeneration, right eye, with active choroidal neovascularization: Secondary | ICD-10-CM

## 2020-09-11 DIAGNOSIS — N390 Urinary tract infection, site not specified: Secondary | ICD-10-CM

## 2020-09-11 LAB — URINALYSIS, ROUTINE W REFLEX MICROSCOPIC
Bilirubin Urine: NEGATIVE
Hgb urine dipstick: NEGATIVE
Ketones, ur: NEGATIVE
Nitrite: NEGATIVE
Specific Gravity, Urine: >=1.03 — AB (ref 1.000–1.030)
Total Protein, Urine: NEGATIVE
Urine Glucose: NEGATIVE
Urobilinogen, UA: 0.2 (ref 0.0–1.0)
pH: 5.5 (ref 5.0–8.0)

## 2020-09-11 LAB — POC URINALSYSI DIPSTICK (AUTOMATED)
Bilirubin, UA: NEGATIVE
Blood, UA: NEGATIVE
Glucose, UA: NEGATIVE
Ketones, UA: NEGATIVE
Nitrite, UA: NEGATIVE
Protein, UA: POSITIVE — AB
Spec Grav, UA: >=1.03 — AB (ref 1.010–1.025)
Urobilinogen, UA: 0.2 E.U./dL
pH, UA: 6 (ref 5.0–8.0)

## 2020-09-11 NOTE — Progress Notes (Signed)
Kaitlyn Parks DOB: Feb 13, 1935 Encounter date: 09/11/2020  This is a 86 y.o. female who presents for complete physical   History of present illness/Additional concerns:  last visit with me was 03/11/2020.  Today states she is having some bladder irritation. Finished keflex last Thursday (7 day treatment); had extreme burning, cramps, back ache prior to that. Sometimes goes months without issue. Monday started with burning and urgency/incontinence. Started with azo. And then switched to cystex (methanamine) which helped some. Does follow with PA at urology; has been seeing Dr. Duerr Singer for this condition for over 30 years. Usually improves with azo. Still with some discomfort, pressure. She does also take 2 cranberry tablets daily - has done this for about a year. Thinks what set her off was glass of wine 2 weeks ago - seems to trigger with acidic foods/drinks. She is working on drinking 2L water daily. She is taking child's probiotic. She has tried to avoid using premarin although has been told it might help. She has follow up visit in April.   Everything on her is dry - eyes, mouth. Using biotin. Eye doc working with her to help with eyes, but still dry. She felt like this progressed for about 5 years; and then in last two years she has been getting injections for macular degeneration. Still getting injections right eye. Nothing is clear in her vision now. Has tried multiple wetting drops. Some have caused irritation/burning and she has had difficulty with tolerating these. She had some teeth issues, but more related to grinding that she was doing rather than dry mouth.   HTN: hydralazine 10mg  BID, imdur 90 mg daily. She does check at home. States that she just took the hydralazine at 8 oclock. Usually if dizzy she is running high, but generally feels that pressure has been well controlled. She notes increase if not getting enough exercise.   HL: crestor 5mg  daily Hypothyroid synthroid daily: Has been  following with Dr. for age-related macular degeneration  Past Medical History:  Diagnosis Date  . Arthritis    DDD, scoliosis, sees Dr. for this, uses norco very rarely for pain  . Bradycardia 11/11/2017  . CAD (coronary artery disease)    LAD stenting of a 90% lesion 2012  . Cystocele   . Educated about COVID-19 virus infection 12/06/2019  . Elevated cholesterol   . GERD (gastroesophageal reflux disease)    dx in work up 2016 for atypical CP at OSH   pt. denies  . Heart murmur   . Hypertensive retinopathy    OU  . Hypothyroidism   . Interstitial cystitis    sees Dr. 02/05/2020  . Macular degeneration    OU  . NSTEMI (non-ST elevated myocardial infarction) (HCC)   . Osteoporosis   . Rectocele   . 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 Scoliosis   . Thyroid disease    Hypothyroid  . Urinary incontinence    USI  . Uterine prolapse    Past Surgical History:  Procedure Laterality Date  . BLADDER SUSPENSION  2011  . CATARACT EXTRACTION Bilateral 2015   Dr. 13/05/2017  . CORONARY ANGIOPLASTY WITH STENT PLACEMENT  04/21/2010   LAD 80%, RCA 30%, nl EF, s/p DES LAD  . EYE SURGERY    . LEFT HEART CATH AND CORONARY ANGIOGRAPHY N/A 06/14/2017   Procedure: LEFT HEART CATH AND CORONARY ANGIOGRAPHY;  Surgeon: 04/23/2010, MD;  Location: [facility];  Service: Cardiovascular;  Laterality: N/A;  .  OOPHORECTOMY  2011   BSO  . TOTAL HIP ARTHROPLASTY Right 06/10/2017   Procedure: RIGHT TOTAL HIP ARTHROPLASTY ANTERIOR APPROACH;  Surgeon: Samson Frederic, MD;  Location: [facility];  Service: Orthopedics;  Laterality: Right;  Needs RNFA  . VAGINAL HYSTERECTOMY  2011   LA[facility] BSO; benign   Allergies  Allergen Reactions  . Acyclovir And Related Other (See Comments)    unknown  . Pravastatin Sodium Other (See Comments)    cystitis  . Zocor [Simvastatin] Other (See Comments)    cystitis   Current Meds  Medication Sig  . aspirin EC 81 MG tablet Take 81 mg by mouth daily.   . Biotin w/ Vitamins C & E (HAIR/SKIN/NAILS PO) Take 1 tablet by mouth daily.  . hydrALAZINE (APRESOLINE) 10 MG tablet 10 mg twice a day, may take extra dose for SBP >160 up to a total of 4 times a day  . isosorbide mononitrate (IMDUR) 60 MG 24 hr tablet Take 1.5 tablets (90 mg total) by mouth daily.  Marland Kitchen levothyroxine (SYNTHROID) 50 MCG tablet TAKE 1 TABLET EACH DAY.  . Multiple Vitamins-Calcium (ONE-A-DAY WOMENS PO) Take 1 tablet by mouth daily.   . Multiple Vitamins-Minerals (PRESERVISION AREDS 2 PO) Take 1 capsule by mouth 2 (two) times daily.  . nitroGLYCERIN (NITROSTAT) 0.4 MG SL tablet DISSOLVE 1 TABLET UNDER TONGUE IF NEEDED FOR CHEST PAIN. MAY REPEAT IN 5 MINUTES FOR 3 DOSES.  Marland Kitchen PRESCRIPTION MEDICATION Avastin eye injections given by Dr Vanessa Barbara due to macular degeneration  . rosuvastatin (CRESTOR) 10 MG tablet TAKE (1/2) TABLET DAILY.   Social History   Tobacco Use  . Smoking status: Never Smoker  . Smokeless tobacco: Never Used  Substance Use Topics  . Alcohol use: No    Comment: Rare   Family History  Problem Relation Age of Onset  . Hypertension Mother   . Heart disease Mother   . Heart disease Father   . Heart disease Sister   . Colon cancer Paternal Aunt 20  . Breast cancer Paternal Aunt        Age 64's  . Diabetes Sister   . Endometrial cancer Sister   . Breast cancer Cousin        Maternal 1st cousins-Age 71's  . Leukemia Paternal Aunt      Review of Systems  Constitutional: Negative for activity change, appetite change, chills, fatigue, fever and unexpected weight change.  HENT: Negative for congestion, ear pain, hearing loss, sinus pressure, sinus pain, sore throat and trouble swallowing.   Eyes: Positive for redness and visual disturbance. Negative for pain.  Respiratory: Negative for cough, chest tightness, shortness of breath and wheezing.   Cardiovascular: Negative for chest pain, palpitations and leg swelling.  Gastrointestinal: Negative for abdominal  pain, blood in stool, constipation, diarrhea, nausea and vomiting.  Genitourinary: Positive for dysuria. Negative for difficulty urinating and menstrual problem.  Musculoskeletal: Negative for arthralgias and back pain.  Skin: Negative for rash.  Neurological: Negative for dizziness, weakness, numbness and headaches.  Hematological: Negative for adenopathy. Does not bruise/bleed easily.  Psychiatric/Behavioral: Negative for sleep disturbance and suicidal ideas. The patient is not nervous/anxious.     CBC:  Lab Results  Component Value Date   WBC 4.2 09/02/2020   HGB 13.8 09/02/2020   HCT 41.1 09/02/2020   MCH 30.2 03/04/2020   MCHC 33.6 09/02/2020   RDW 13.5 09/02/2020   PLT 259.0 09/02/2020   MPV 10.3 03/04/2020   CMP: Lab Results  Component Value Date  NA 140 09/02/2020   K 4.1 09/02/2020   CL 105 09/02/2020   CO2 28 09/02/2020   ANIONGAP 8 10/09/2018   GLUCOSE 97 09/02/2020   BUN 18 09/02/2020   CREATININE 0.76 09/02/2020   CREATININE 0.77 03/04/2020   GFRAA >60 10/09/2018   CALCIUM 9.3 09/02/2020   CALCIUM 9.0 07/01/2010   PROT 6.2 09/02/2020   PROT 6.7 02/05/2017   BILITOT 0.5 09/02/2020   BILITOT 0.3 02/05/2017   ALKPHOS 62 09/02/2020   ALT 16 09/02/2020   AST 20 09/02/2020   LIPID: Lab Results  Component Value Date   CHOL 165 09/02/2020   CHOL 160 02/05/2017   TRIG 143.0 09/02/2020   HDL 61.70 09/02/2020   HDL 52 02/05/2017   LDLCALC 74 09/02/2020   LDLCALC 72 03/04/2020   LABVLDL 31 02/05/2017    Objective:  BP 100/60 (BP Location: Left Arm, Patient Position: Sitting, Cuff Size: Normal)   Pulse (!) 51   Temp 97.6 F (36.4 C) (Oral)   Ht 5' 3.75" (1.619 m)   Wt 131 lb 14.4 oz (59.8 kg)   SpO2 96%   BMI 22.82 kg/m   Weight: 131 lb 14.4 oz (59.8 kg)   BP Readings from Last 3 Encounters:  09/11/20 100/60  06/17/20 134/72  05/27/20 129/70   Wt Readings from Last 3 Encounters:  09/11/20 131 lb 14.4 oz (59.8 kg)  06/17/20 130 lb (59 kg)   05/29/20 128 lb (58.1 kg)    Physical Exam Constitutional:      General: She is not in acute distress.    Appearance: She is well-developed and well-nourished.  HENT:     Head: Normocephalic and atraumatic.     Right Ear: External ear normal.     Left Ear: External ear normal.     Mouth/Throat:     Mouth: Oropharynx is clear and moist.     Pharynx: No oropharyngeal exudate.  Eyes:     Conjunctiva/sclera: Conjunctivae normal.     Pupils: Pupils are equal, round, and reactive to light.  Neck:     Thyroid: No thyromegaly.  Cardiovascular:     Rate and Rhythm: Normal rate and regular rhythm.     Heart sounds: Normal heart sounds. No murmur heard. No friction rub. No gallop.   Pulmonary:     Effort: Pulmonary effort is normal.     Breath sounds: Normal breath sounds.  Chest:  Breasts:     Right: Normal.     Left: Normal.    Abdominal:     General: Bowel sounds are normal. There is no distension.     Palpations: Abdomen is soft. There is no mass.     Tenderness: There is no abdominal tenderness. There is no guarding.     Hernia: No hernia is present.  Musculoskeletal:        General: No tenderness, deformity or edema. Normal range of motion.     Cervical back: Normal range of motion and neck supple.  Lymphadenopathy:     Cervical: No cervical adenopathy.  Skin:    General: Skin is warm and dry.     Findings: No rash.  Neurological:     Mental Status: She is alert and oriented to person, place, and time.     Deep Tendon Reflexes: Strength normal. Reflexes normal.     Reflex Scores:      Tricep reflexes are 2+ on the right side and 2+ on the left side.  Bicep reflexes are 2+ on the right side and 2+ on the left side.      Brachioradialis reflexes are 2+ on the right side and 2+ on the left side.      Patellar reflexes are 2+ on the right side and 2+ on the left side. Psychiatric:        Mood and Affect: Mood and affect normal.        Speech: Speech normal.         Behavior: Behavior normal.        Thought Content: Thought content normal.     Assessment/Plan: Health Maintenance Due  Topic Date Due  . COVID-19 Vaccine (3 - Booster for Pfizer series) 05/03/2020   Health Maintenance reviewed - she has upcoming mammogram in March. UTD with preventative health care.   1. Preventative health care  She is up to date with preventative health needs. We discussed getting back in to golfing which she enjoys since she maintains her club membership.   2. Coronary artery disease involving native heart without angina pectoris, unspecified vessel or lesion type Her blood pressure and cholesterol been well controlled.  See below. - CBC with Differential/Platelet; Future  3. Hypothyroidism (acquired) Thyroid has been well controlled.  Continue 50 micrograms daily of Synthroid.  4. Dyslipidemia Continue Crestor 10 mg tablet (half tablet daily) - Comprehensive metabolic panel; Future - Lipid panel; Future  5. 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  Her eyes (and mouth) are very dry.  Sjogren's testing was negative in the office.  I reached out to rheumatology to see if they would recommend any particular follow-up given negative labs but continued symptoms.   6. Dysuria She is following regularly with urology.  I am going to recheck her urine today since she is still having some dysuria.  She has just completed a course of Keflex. - Urinalysis; Future - Urine Culture; Future - POCT Urinalysis Dipstick (Automated)  Return in about 6 months (around 03/11/2021) for Chronic condition visit; bloodwork prior if desired.  Theodis Shove, MD

## 2020-09-12 ENCOUNTER — Telehealth: Payer: Self-pay | Admitting: Family Medicine

## 2020-09-12 NOTE — Telephone Encounter (Signed)
I was trying to wait for culture, but it is pending. If it is resulted when you get this message please check with me to confirm antibiotic choice. If still pending, then ok to send in bactrim DS 1 tab PO BID x 5 days. 10 tablets. 0 refills. Let her know I will still check in once we get culture results.

## 2020-09-12 NOTE — Telephone Encounter (Signed)
Pt call and want something call in for a bladder infection because the doctor she was sent to is leaving .she want it sent to  [facility] [place], [place] - [address] [place] Phone:  [phone]  Fax:  [phone]

## 2020-09-13 ENCOUNTER — Other Ambulatory Visit: Payer: Self-pay

## 2020-09-13 ENCOUNTER — Encounter (INDEPENDENT_AMBULATORY_CARE_PROVIDER_SITE_OTHER): Payer: Self-pay | Admitting: Ophthalmology

## 2020-09-13 ENCOUNTER — Ambulatory Visit (INDEPENDENT_AMBULATORY_CARE_PROVIDER_SITE_OTHER): Payer: Medicare Other | Admitting: Ophthalmology

## 2020-09-13 ENCOUNTER — Telehealth: Payer: Self-pay | Admitting: Family Medicine

## 2020-09-13 DIAGNOSIS — H35033 Hypertensive retinopathy, bilateral: Secondary | ICD-10-CM

## 2020-09-13 DIAGNOSIS — H353221 Exudative age-related macular degeneration, left eye, with active choroidal neovascularization: Secondary | ICD-10-CM

## 2020-09-13 DIAGNOSIS — I10 Essential (primary) hypertension: Secondary | ICD-10-CM

## 2020-09-13 DIAGNOSIS — H04123 Dry eye syndrome of bilateral lacrimal glands: Secondary | ICD-10-CM

## 2020-09-13 DIAGNOSIS — H3581 Retinal edema: Secondary | ICD-10-CM | POA: Diagnosis not present

## 2020-09-13 DIAGNOSIS — H353211 Exudative age-related macular degeneration, right eye, with active choroidal neovascularization: Secondary | ICD-10-CM | POA: Diagnosis not present

## 2020-09-13 DIAGNOSIS — H353112 Nonexudative age-related macular degeneration, right eye, intermediate dry stage: Secondary | ICD-10-CM

## 2020-09-13 DIAGNOSIS — Z961 Presence of intraocular lens: Secondary | ICD-10-CM

## 2020-09-13 LAB — URINE CULTURE
MICRO NUMBER:: 11514006
SPECIMEN QUALITY:: ADEQUATE

## 2020-09-13 MED ORDER — BEVACIZUMAB CHEMO INJECTION 1.25MG/0.05ML SYRINGE FOR KALEIDOSCOPE
1.2500 mg | INTRAVITREAL | Status: AC | PRN
  Administered 2020-09-13: 1.25 mg via INTRAVITREAL

## 2020-09-13 MED ORDER — SULFAMETHOXAZOLE-TRIMETHOPRIM 800-160 MG PO TABS: 1.0000 | ORAL_TABLET | Freq: Two times a day (BID) | ORAL | 0 refills | Status: DC

## 2020-09-13 NOTE — Telephone Encounter (Signed)
See prior note

## 2020-09-13 NOTE — Telephone Encounter (Signed)
Pt call and stated [facility] said they haven't receive to urine test result and she is still in pain and need something done.

## 2020-09-13 NOTE — Telephone Encounter (Signed)
Spoke with the pt and informed her the Rx for Bactrim DS was sent to [facility] as urine culture results are pending.  Patient is aware she will be contacted once results are received.

## 2020-09-15 ENCOUNTER — Encounter: Payer: Self-pay | Admitting: Family Medicine

## 2020-09-15 ENCOUNTER — Other Ambulatory Visit: Payer: Self-pay | Admitting: Family Medicine

## 2020-09-15 MED ORDER — NITROFURANTOIN MONOHYD MACRO 100 MG PO CAPS: 100.0000 mg | ORAL_CAPSULE | Freq: Two times a day (BID) | ORAL | 0 refills | Status: DC

## 2020-09-16 NOTE — Addendum Note (Signed)
Addended by: Johnella Moloney on: 09/16/2020 10:01 AM   Modules accepted: Orders

## 2020-09-18 ENCOUNTER — Encounter: Payer: Self-pay | Admitting: Family Medicine

## 2020-09-23 ENCOUNTER — Other Ambulatory Visit: Payer: Self-pay

## 2020-09-23 ENCOUNTER — Other Ambulatory Visit: Payer: Medicare Other

## 2020-09-23 DIAGNOSIS — N390 Urinary tract infection, site not specified: Secondary | ICD-10-CM

## 2020-09-24 LAB — URINE CULTURE
MICRO NUMBER:: 11558484
Result:: NO GROWTH
SPECIMEN QUALITY:: ADEQUATE

## 2020-09-27 ENCOUNTER — Telehealth: Payer: Self-pay | Admitting: *Deleted

## 2020-09-27 NOTE — Telephone Encounter (Signed)
-----   Message from Wynn Banker, MD sent at 09/26/2020  9:58 PM EST ----- Please see Dr. Fatima Sanger note below. I will leave it up to [facility] if she wants to follow up with Dr. Corliss Skains (rheumatology). She may find that as we go into less dry months of the year that things are better. She suggested a few products below for symptoms, but if she would like a referral for further testing, we can do that. We held off on immediate referral since her bloodwork was ok and she already has a few specialists that keep her busy.  ----- Message ----- From: Pollyann Savoy, MD Sent: 09/18/2020   5:30 PM EST To: Wynn Banker, MD  It is not unusual to have dry out eyes and dry mouth symptoms as patient's age.  Some of the medications like diuretics can also make the symptoms worse.  Patient is usually complain of increased sicca symptoms during the winter months due to the use of heating.  Although it is also possible that patient may have seronegative Sjogren's.  The treatment usually is the over-the-counter products like Systane complete eyedrops, Biotene mouthwash, and Xymelts. I hope this helps.  If you still want me to evaluate her please let me know. Janalyn Rouse ----- Message ----- From: Wynn Banker, MD Sent: 09/11/2020   9:34 AM EST To: Pollyann Savoy, MD  Hi Dr. Corliss Skains. Question on this patient (not yours; yet). Eye doc reached out to me with issue of extreme dry eye and dry mouth; been going on for about 5 years. Bloodwork was negative for ANA, Anti-Ro/La. Just curious with all negative if you thought still worth referral? She is working hard with eye doc to manage this and macular degeneration and she is managing mouth dryness with biotin. Appreciate your thoughts.

## 2020-09-27 NOTE — Telephone Encounter (Signed)
-----   Message from Junell C Koberlein, MD sent at 09/26/2020  9:58 PM EST ----- Please see Dr. Deveshwar's note below. I will leave it up to Amrie if she wants to follow up with Dr. Deveshwar (rheumatology). She may find that as we go into less dry months of the year that things are better. She suggested a few products below for symptoms, but if she would like a referral for further testing, we can do that. We held off on immediate referral since her bloodwork was ok and she already has a few specialists that keep her busy.  ----- Message ----- From: Deveshwar, Shaili, MD Sent: 09/18/2020   5:30 PM EST To: Junell C Koberlein, MD  It is not unusual to have dry out eyes and dry mouth symptoms as patient's age.  Some of the medications like diuretics can also make the symptoms worse.  Patient is usually complain of increased sicca symptoms during the winter months due to the use of heating.  Although it is also possible that patient may have seronegative Sjogren's.  The treatment usually is the over-the-counter products like Systane complete eyedrops, Biotene mouthwash, and Xymelts. I hope this helps.  If you still want me to evaluate her please let me know. Shaili ----- Message ----- From: Koberlein, Junell C, MD Sent: 09/11/2020   9:34 AM EST To: Shaili Deveshwar, MD  Hi Dr. Deveshwar. Question on this patient (not yours; yet). Eye doc reached out to me with issue of extreme dry eye and dry mouth; been going on for about 5 years. Bloodwork was negative for ANA, Anti-Ro/La. Just curious with all negative if you thought still worth referral? She is working hard with eye doc to manage this and macular degeneration and she is managing mouth dryness with biotin. Appreciate your thoughts.   

## 2020-09-27 NOTE — Telephone Encounter (Signed)
Patient informed of the message below, stated she will think about this and call back for a referral if needed.

## 2020-10-02 ENCOUNTER — Other Ambulatory Visit: Payer: Self-pay | Admitting: Cardiology

## 2020-10-09 LAB — HM MAMMOGRAPHY

## 2020-10-12 ENCOUNTER — Encounter: Payer: Self-pay | Admitting: Family Medicine

## 2020-10-12 ENCOUNTER — Telehealth: Payer: Self-pay | Admitting: *Deleted

## 2020-10-12 ENCOUNTER — Telehealth (INDEPENDENT_AMBULATORY_CARE_PROVIDER_SITE_OTHER): Payer: Medicare Other | Admitting: Family Medicine

## 2020-10-12 DIAGNOSIS — I10 Essential (primary) hypertension: Secondary | ICD-10-CM | POA: Diagnosis not present

## 2020-10-12 MED ORDER — AMLODIPINE BESYLATE 5 MG PO TABS: 5.0000 mg | ORAL_TABLET | Freq: Every day | ORAL | 3 refills | Status: DC

## 2020-10-12 NOTE — Assessment & Plan Note (Signed)
BP has been consistently elevated over the last several days.  It has not been terribly responsive to significant increases in her hydralazine dosing.  Discussed starting on a new medication.  I did send amlodipine 5 mg once daily into her pharmacy.  Advised that she start this later this afternoon given that she has a significant amount of hydralazine in her system compared to her baseline.  Discussed that BPs in the 160s are not terribly concerning in the short-term though we will try to get her blood pressure down with addition of another medication.  She will continue her prior dosing of hydralazine and Imdur.  Advised to seek medical attention in the emergency department for chest pain or shortness of breath or persistently elevated blood pressures.  I advised that if she develops lightheadedness or blood pressures less than 100/60 she needs to contact us.  I did discuss that there is a risk of dropping her blood pressure too low with addition of another medication though I suspect this will not happen.  If she has further blood pressure issues this weekend or next week I encouraged her to call back.  I will send my note to her PCP so they can arrange for follow-up.

## 2020-10-12 NOTE — Telephone Encounter (Signed)
Pt called to report BP in the high 180's/100's since yesterday. She has taken 10mg  of Hydralazine in the last 24 hours.  Sx's; lightheaded, tightness in upper back & cheeks are hot.   Pt denies SOB or chest pain  Note: Pt has H/O Stint (LAD) & has had a 45% blockage in the past to the LAD.  Pt agreeable to video visit if needed.  Please advise

## 2020-10-12 NOTE — Telephone Encounter (Signed)
Please get her set up for a virtual visit.

## 2020-10-12 NOTE — Progress Notes (Signed)
Virtual Visit via video Note  This visit type was conducted due to national recommendations for restrictions regarding the COVID-19 pandemic (e.g. social distancing).  This format is felt to be most appropriate for this patient at this time.  All issues noted in this document were discussed and addressed.  No physical exam was performed (except for noted visual exam findings with Video Visits).   I connected with Kaitlyn Parks today at  9:40 AM EST by a video enabled telemedicine application or telephone and verified that I am speaking with the correct person using two identifiers. Location patient: home Location provider: work  Persons participating in the virtual visit: patient, provider  I discussed the limitations, risks, security and privacy concerns of performing an evaluation and management service by telephone and the availability of in person appointments. I also discussed with the patient that there may be a patient responsible charge related to this service. The patient expressed understanding and agreed to proceed.  Interactive audio and video telecommunications were attempted between this provider and patient, however failed, due to patient having technical difficulties OR patient did not have access to video capability.  We continued and completed visit with audio only.   Reason for visit: same day visit  HPI: HYPERTENSION  Disease Monitoring  Home BP Monitoring patient had not been checking her blood pressure until the past week or so.  She had an injection this past week in her neck and prior to the injection her blood pressure systolically was 195.  They noted they felt this was anxiety related though since then her blood pressure has been running in the 160s to 190s systolically.  She does feel as though her face has been flushed with this. Chest pain-no    dyspnea-no Medications  Compliance-taking hydralazine 10 mg in the morning, middle the day, and 20 mg in the evening,  also on Imdur 90 mg daily.  Edema-no The patient does note over the last 24 hours she is taken 150 mg of hydralazine and has not gotten her blood pressure lower than 164 systolically. She notes no history of elevated blood pressures following injections. She does have a history of labile blood pressures in the past.    ROS: See pertinent positives and negatives per HPI.  Past Medical History:  Diagnosis Date  . Arthritis    DDD, scoliosis, sees Dr. Lovell Sheehan for this, uses norco very rarely for pain  . Bradycardia 11/11/2017  . CAD (coronary artery disease)    LAD stenting of a 90% lesion 2012  . Cystocele   . Educated about COVID-19 virus infection 12/06/2019  . Elevated cholesterol   . GERD (gastroesophageal reflux disease)    dx in work up 2016 for atypical CP at OSH   pt. denies  . Heart murmur   . Hypertensive retinopathy    OU  . Hypothyroidism   . Interstitial cystitis    sees Dr. [name]  . Macular degeneration    OU  . NSTEMI (non-ST elevated myocardial infarction) (HCC)   . Osteoporosis   . Rectocele   . 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 Scoliosis   . Thyroid disease    Hypothyroid  . Urinary incontinence    USI  . Uterine prolapse     Past Surgical History:  Procedure Laterality Date  . BLADDER SUSPENSION  2011  . CATARACT EXTRACTION Bilateral 2015   Dr. Elmer Picker  . CORONARY ANGIOPLASTY WITH STENT PLACEMENT  04/21/2010   LAD 80%, RCA  30%, nl EF, s/p DES LAD  . EYE SURGERY    . LEFT HEART CATH AND CORONARY ANGIOGRAPHY N/A 06/14/2017   Procedure: LEFT HEART CATH AND CORONARY ANGIOGRAPHY;  Surgeon: Runell Gess, MD;  Location: [facility];  Service: Cardiovascular;  Laterality: N/A;  . OOPHORECTOMY  2011   BSO  . TOTAL HIP ARTHROPLASTY Right 06/10/2017   Procedure: RIGHT TOTAL HIP ARTHROPLASTY ANTERIOR APPROACH;  Surgeon: Samson Frederic, MD;  Location: [facility];  Service: Orthopedics;  Laterality: Right;  Needs RNFA  . VAGINAL HYSTERECTOMY  2011    LAVH BSO; benign    Family History  Problem Relation Age of Onset  . Hypertension Mother   . Heart disease Mother   . Heart disease Father   . Heart disease Sister   . Colon cancer Paternal Aunt 61  . Breast cancer Paternal Aunt        Age 22's  . Diabetes Sister   . Endometrial cancer Sister   . Breast cancer Cousin        Maternal 1st cousins-Age 63's  . Leukemia Paternal Aunt     SOCIAL HX: Non-smoker   Current Outpatient Medications:  .  amLODipine (NORVASC) 5 MG tablet, Take 1 tablet (5 mg total) by mouth daily., Disp: 90 tablet, Rfl: 3 .  aspirin EC 81 MG tablet, Take 81 mg by mouth daily., Disp: , Rfl:  .  Biotin w/ Vitamins C & E (HAIR/SKIN/NAILS PO), Take 1 tablet by mouth daily., Disp: , Rfl:  .  hydrALAZINE (APRESOLINE) 10 MG tablet, 10 mg twice a day, may take extra dose for SBP >160 up to a total of 4 times a day, Disp: 180 tablet, Rfl: 3 .  isosorbide mononitrate (IMDUR) 60 MG 24 hr tablet, TAKE 1 & 1/2 TABLETS A DAY., Disp: 135 tablet, Rfl: 1 .  levothyroxine (SYNTHROID) 50 MCG tablet, TAKE 1 TABLET EACH DAY., Disp: 90 tablet, Rfl: 1 .  Multiple Vitamins-Calcium (ONE-A-DAY WOMENS PO), Take 1 tablet by mouth daily. , Disp: , Rfl:  .  Multiple Vitamins-Minerals (PRESERVISION AREDS 2 PO), Take 1 capsule by mouth 2 (two) times daily., Disp: , Rfl:  .  nitrofurantoin, macrocrystal-monohydrate, (MACROBID) 100 MG capsule, Take 1 capsule (100 mg total) by mouth 2 (two) times daily., Disp: 10 capsule, Rfl: 0 .  nitroGLYCERIN (NITROSTAT) 0.4 MG SL tablet, DISSOLVE 1 TABLET UNDER TONGUE IF NEEDED FOR CHEST PAIN. MAY REPEAT IN 5 MINUTES FOR 3 DOSES., Disp: 25 tablet, Rfl: 0 .  PRESCRIPTION MEDICATION, Avastin eye injections given by Dr Vanessa Barbara due to macular degeneration, Disp: , Rfl:  .  rosuvastatin (CRESTOR) 10 MG tablet, TAKE (1/2) TABLET DAILY., Disp: 45 tablet, Rfl: 8  EXAM:  VITALS per patient if applicable:  GENERAL: alert, oriented, appears well and in no acute  distress  HEENT: atraumatic, conjunttiva clear, no obvious abnormalities on inspection of external nose and ears  NECK: normal movements of the head and neck  LUNGS: on inspection no signs of respiratory distress, breathing rate appears normal, no obvious gross SOB, gasping or wheezing  CV: no obvious cyanosis  MS: moves all visible extremities without noticeable abnormality  PSYCH/NEURO: pleasant and cooperative, no obvious depression or anxiety, speech and thought processing grossly intact  ASSESSMENT AND PLAN:  Discussed the following assessment and plan:  Problem List Items Addressed This Visit    Hypertension    BP has been consistently elevated over the last several days.  It has not been terribly responsive  to significant increases in her hydralazine dosing.  Discussed starting on a new medication.  I did send amlodipine 5 mg once daily into her pharmacy.  Advised that she start this later this afternoon given that she has a significant amount of hydralazine in her system compared to her baseline.  Discussed that BPs in the 160s are not terribly concerning in the short-term though we will try to get her blood pressure down with addition of another medication.  She will continue her prior dosing of hydralazine and Imdur.  Advised to seek medical attention in the emergency department for chest pain or shortness of breath or persistently elevated blood pressures.  I advised that if she develops lightheadedness or blood pressures less than 100/60 she needs to contact us.  I did discuss that there is a risk of dropping her blood pressure too low with addition of another medication though I suspect this will not happen.  If she has further blood pressure issues this weekend or next week I encouraged her to call back.  I will send my note to her PCP so they can arrange for follow-up.      Relevant Medications   amLODipine (NORVASC) 5 MG tablet       I discussed the assessment and  treatment plan with the patient. The patient was provided an opportunity to ask questions and all were answered. The patient agreed with the plan and demonstrated an understanding of the instructions.   The patient was advised to call back or seek an in-person evaluation if the symptoms worsen or if the condition fails to improve as anticipated.    Marikay Alar, MD

## 2020-10-16 ENCOUNTER — Encounter: Payer: Self-pay | Admitting: Family Medicine

## 2020-10-24 ENCOUNTER — Telehealth: Payer: Self-pay | Admitting: *Deleted

## 2020-10-24 NOTE — Telephone Encounter (Signed)
Patient informed of the message below and stated she has an appt next week with Dr Antoine Poche and questioned if she needed to see Dr Melton Krebs?  Also questioned if she was to take Hydralazine in addition to her regular BP meds?  Message sent to PCP.

## 2020-10-24 NOTE — Progress Notes (Signed)
Please set up follow up visit for her/check in on how bp are doing

## 2020-10-24 NOTE — Telephone Encounter (Signed)
-----   Message from Wynn Banker, MD sent at 10/24/2020 12:50 PM EDT -----   ----- Message ----- From: Glori Luis, MD Sent: 10/12/2020  10:12 AM EDT To: Wynn Banker, MD  Seen in Saturday clinic.  BP elevated.  She will need follow-up sometime in the next week or 2.

## 2020-10-25 NOTE — Telephone Encounter (Signed)
Patient informed of the message below.

## 2020-10-25 NOTE — Telephone Encounter (Signed)
She doesn't need to see me if she is seeing Dr. Antoine Poche. I would have her continue her regular dose of hydralazine in addition to the amlodipine (new med) and other medications.

## 2020-10-28 NOTE — Progress Notes (Signed)
Cardiology Office Note   Date:  10/29/2020   ID:  Kaitlyn Parks, Kaitlyn Parks 01/25/1935, MRN [id number]  PCP:  Wynn Banker, MD  Cardiologist:   Rollene Rotunda, MD   Chief Complaint  Patient presents with  . Hypertension      History of Present Illness: Kaitlyn Parks is a 86 y.o. female who presents for followup up of CAD.  She had a PCI of LAD in 2012. She recently underwent total right hip arthroplasty. She presented to the hospital on 06/12/2017 with sudden onset of right-sided neck pain with radiation to the right jaw. CTA of the chest was negative for PE however troponin was elevated at 0.42. Echocardiogram was obtained on 06/13/2017 showed EF 60-65%, mild LVH, grade 1 DD, mild MR, mild TR, peak PA pressure 30 mmHg. Cardiac catheterization performed on 06/14/2017 showed 95% ostial D1 lesion, widely patent ostial LAD stent, 30% proximal LAD stenosis. Medical therapy was recommended. Imdur 60 mg daily was added to her medical regimen. Beta blocker was initially increased and later reduced due to symptomatic bradycardia. At a previous visit I stopped the beta blocker.   She had chest pain recently and had a negative perfusion study.    Since I last saw her she had a night where she thinks she got some extra salt in her systolic blood pressure was in the 190s.  She called into her primary provider and the on-call physician started amlodipine.  In addition at her recent visit her primary provider also increased her Imdur.  I reviewed her blood pressure recordings on her machine and they seem to be better controlled than previous.  She is having to take less as needed hydralazine.  Of note the night it was elevated she had taken a total of 150 mg in 24 hours.  She does her water aerobics.  She does her shopping and lives by herself.  She recently emptied 10 bookshelves to get ready for some painting.   Past Medical History:  Diagnosis Date  . Arthritis    DDD, scoliosis, sees Dr. Lovell Sheehan  for this, uses norco very rarely for pain  . Bradycardia 11/11/2017  . CAD (coronary artery disease)    LAD stenting of a 90% lesion 2012  . Cystocele   . Educated about COVID-19 virus infection 12/06/2019  . Elevated cholesterol   . GERD (gastroesophageal reflux disease)    dx in work up 2016 for atypical CP at OSH   pt. denies  . Heart murmur   . Hypertensive retinopathy    OU  . Hypothyroidism   . Interstitial cystitis    sees Dr. [name]  . Macular degeneration    OU  . NSTEMI (non-ST elevated myocardial infarction) (HCC)   . Osteoporosis   . Rectocele   . 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 Scoliosis   . Thyroid disease    Hypothyroid  . Urinary incontinence    USI  . Uterine prolapse     Past Surgical History:  Procedure Laterality Date  . BLADDER SUSPENSION  2011  . CATARACT EXTRACTION Bilateral 2015   Dr. Elmer Picker  . CORONARY ANGIOPLASTY WITH STENT PLACEMENT  04/21/2010   LAD 80%, RCA 30%, nl EF, s/p DES LAD  . EYE SURGERY    . LEFT HEART CATH AND CORONARY ANGIOGRAPHY N/A 06/14/2017   Procedure: LEFT HEART CATH AND CORONARY ANGIOGRAPHY;  Surgeon: Runell Gess, MD;  Location: [facility];  Service: Cardiovascular;  Laterality:  N/A;  . OOPHORECTOMY  2011   BSO  . TOTAL HIP ARTHROPLASTY Right 06/10/2017   Procedure: RIGHT TOTAL HIP ARTHROPLASTY ANTERIOR APPROACH;  Surgeon: Samson Frederic, MD;  Location: [facility];  Service: Orthopedics;  Laterality: Right;  Needs RNFA  . VAGINAL HYSTERECTOMY  2011   LAVH BSO; benign     Current Outpatient Medications  Medication Sig Dispense Refill  . amLODipine (NORVASC) 5 MG tablet Take 1 tablet (5 mg total) by mouth daily. 90 tablet 3  . aspirin EC 81 MG tablet Take 81 mg by mouth daily.    . Biotin w/ Vitamins C & E (HAIR/SKIN/NAILS PO) Take 1 tablet by mouth daily.    . Cranberry 400 MG CAPS Take 400 mg by mouth daily.    . Fish Oil-Lutein-D-Zeaxanthin (EYE OMEGA ADVANTAGE/VIT D-3) CAPS Take by mouth daily.    .  hydrALAZINE (APRESOLINE) 10 MG tablet 10 mg twice a day, may take extra dose for SBP >160 up to a total of 4 times a day 180 tablet 3  . isosorbide mononitrate (IMDUR) 60 MG 24 hr tablet TAKE 1 & 1/2 TABLETS A DAY. 135 tablet 1  . levothyroxine (SYNTHROID) 50 MCG tablet TAKE 1 TABLET EACH DAY. 90 tablet 1  . Multiple Vitamins-Calcium (ONE-A-DAY WOMENS PO) Take 1 tablet by mouth daily.     . Multiple Vitamins-Minerals (PRESERVISION AREDS 2 PO) Take 1 capsule by mouth 2 (two) times daily.    . nitroGLYCERIN (NITROSTAT) 0.4 MG SL tablet DISSOLVE 1 TABLET UNDER TONGUE IF NEEDED FOR CHEST PAIN. MAY REPEAT IN 5 MINUTES FOR 3 DOSES. 25 tablet 0  . PRESCRIPTION MEDICATION Avastin eye injections given by Dr Vanessa Barbara due to macular degeneration    . rosuvastatin (CRESTOR) 10 MG tablet TAKE (1/2) TABLET DAILY. 45 tablet 8   No current facility-administered medications for this visit.    Allergies:   Acyclovir and related, Pravastatin sodium, Sulfa antibiotics, and Zocor [simvastatin]     ROS:  Please see the history of present illness.   Otherwise, review of systems are positive for none.   All other systems are reviewed and negative.    PHYSICAL EXAM: VS:  BP (!) 142/54 (BP Location: Left Arm, Patient Position: Sitting)   Pulse 81   Ht 5\' 4"  (1.626 m)   Wt 127 lb 12.8 oz (58 kg)   SpO2 97%   BMI 21.94 kg/m  , BMI Body mass index is 21.94 kg/m. GENERAL:  Well appearing NECK:  No jugular venous distention, waveform within normal limits, carotid upstroke brisk and symmetric, no bruits, no thyromegaly LUNGS:  Clear to auscultation bilaterally CHEST:  Unremarkable HEART:  PMI not displaced or sustained,S1 and S2 within normal limits, no S3, no S4, no clicks, no rubs, soft apical systolic murmur radiating out the aortic outflow tract and early peaking, no diastolic murmurs ABD:  Flat, positive bowel sounds normal in frequency in pitch, no bruits, no rebound, no guarding, no midline pulsatile mass, no  hepatomegaly, no splenomegaly EXT:  2 plus pulses throughout, no edema, no cyanosis no clubbing     EKG:  EKG is  not ordered today.   Recent Labs: 09/02/2020: ALT 16; BUN 18; Creatinine, Ser 0.76; Hemoglobin 13.8; Platelets 259.0; Potassium 4.1; Sodium 140; TSH 3.25    Lipid Panel    Component Value Date/Time   CHOL 165 09/02/2020 0828   CHOL 160 02/05/2017 0834   TRIG 143.0 09/02/2020 0828   HDL 61.70 09/02/2020 0828   HDL 52  02/05/2017 0834   CHOLHDL 3 09/02/2020 0828   VLDL 28.6 09/02/2020 0828   LDLCALC 74 09/02/2020 0828   LDLCALC 72 03/04/2020 0828   LDLDIRECT 140.6 05/25/2012 0855      Wt Readings from Last 3 Encounters:  10/29/20 127 lb 12.8 oz (58 kg)  10/12/20 130 lb (59 kg)  09/11/20 131 lb 14.4 oz (59.8 kg)      Other studies Reviewed: Additional studies/ records that were reviewed today include: None Review of the above records demonstrates:    NA   ASSESSMENT AND PLAN:   CAD, NATIVE VESSEL -  The patient has no new sypmtoms.  No further cardiovascular testing is indicated.  We will continue with aggressive risk reduction and meds as listed.  HYPERTENSION -  Her blood pressure is controlled on the current regimen.  No change in therapy.  It is labile so she understands how to use the as needed hydralazine.    Current medicines are reviewed at length with the patient today.  The patient does not have concerns regarding medicines.  The following changes have been made:   None  Labs/ tests ordered today include:   None  No orders of the defined types were placed in this encounter.    Disposition:   FU with me in 6 months per her request.     Signed, Rollene Rotunda, MD  10/29/2020 11:30 AM    [facility]

## 2020-10-29 ENCOUNTER — Ambulatory Visit: Payer: Medicare Other | Admitting: Cardiology

## 2020-10-29 ENCOUNTER — Other Ambulatory Visit: Payer: Self-pay

## 2020-10-29 ENCOUNTER — Encounter: Payer: Self-pay | Admitting: Cardiology

## 2020-10-29 VITALS — BP 142/54 | HR 81 | Ht 64.0 in | Wt 127.8 lb

## 2020-10-29 DIAGNOSIS — R42 Dizziness and giddiness: Secondary | ICD-10-CM

## 2020-10-29 DIAGNOSIS — I10 Essential (primary) hypertension: Secondary | ICD-10-CM

## 2020-10-29 DIAGNOSIS — I2510 Atherosclerotic heart disease of native coronary artery without angina pectoris: Secondary | ICD-10-CM | POA: Diagnosis not present

## 2020-10-29 NOTE — Patient Instructions (Signed)
Medication Instructions:  Continue current medications  *If you need a refill on your cardiac medications before your next appointment, please call your pharmacy*   Lab Work: None Ordered   Testing/Procedures: None Ordered   Follow-Up: At [facility], you and your health needs are our priority.  As part of our continuing mission to provide you with exceptional heart care, we have created designated Provider Care Teams.  These Care Teams include your primary Cardiologist (physician) and Advanced Practice Providers (APPs -  Physician Assistants and Nurse Practitioners) who all work together to provide you with the care you need, when you need it.  We recommend signing up for the patient portal called "MyChart".  Sign up information is provided on this After Visit Summary.  MyChart is used to connect with patients for Virtual Visits (Telemedicine).  Patients are able to view lab/test results, encounter notes, upcoming appointments, etc.  Non-urgent messages can be sent to your provider as well.   To learn more about what you can do with MyChart, go to [email or web address].    Your next appointment:   6 month(s)   The format for your next appointment:   In Person  Provider:   You may see James Hochrein, MD or one of the following Advanced Practice Providers on your designated Care Team:    Rhonda Barrett, PA-C  Kathryn Lawrence, DNP, ANP     

## 2020-10-31 ENCOUNTER — Other Ambulatory Visit: Payer: Self-pay | Admitting: Family Medicine

## 2020-10-31 DIAGNOSIS — E039 Hypothyroidism, unspecified: Secondary | ICD-10-CM

## 2020-11-05 ENCOUNTER — Other Ambulatory Visit: Payer: Self-pay | Admitting: Family Medicine

## 2020-11-05 ENCOUNTER — Telehealth: Payer: Self-pay | Admitting: Family Medicine

## 2020-11-05 NOTE — Telephone Encounter (Signed)
Patient is requesting a call from Kaitlyn Parks.  She would not give any information other than it was regarding a medication.  Please advise.

## 2020-11-05 NOTE — Telephone Encounter (Signed)
Spoke with the pt and the appt was rescheduled with PCP for 4/6 at 11am due to opening.

## 2020-11-05 NOTE — Telephone Encounter (Signed)
Spoke with the pt and she stated she questions if she has a UTI again.  I scheduled the pt an appt with Dr Clent Ridges for tomorrow as PCP does not have any openings.

## 2020-11-06 ENCOUNTER — Other Ambulatory Visit: Payer: Self-pay

## 2020-11-06 ENCOUNTER — Ambulatory Visit: Payer: Medicare Other | Admitting: Family Medicine

## 2020-11-06 ENCOUNTER — Encounter: Payer: Self-pay | Admitting: Family Medicine

## 2020-11-06 VITALS — BP 120/80 | HR 77 | Temp 97.7°F | Ht 64.0 in | Wt 127.9 lb

## 2020-11-06 DIAGNOSIS — N3000 Acute cystitis without hematuria: Secondary | ICD-10-CM

## 2020-11-06 DIAGNOSIS — R300 Dysuria: Secondary | ICD-10-CM | POA: Diagnosis not present

## 2020-11-06 LAB — POCT URINALYSIS DIPSTICK
Bilirubin, UA: NEGATIVE
Glucose, UA: NEGATIVE
Ketones, UA: NEGATIVE
Nitrite, UA: NEGATIVE
Protein, UA: POSITIVE — AB
Spec Grav, UA: 1.02 (ref 1.010–1.025)
Urobilinogen, UA: 0.2 E.U./dL
pH, UA: 5 (ref 5.0–8.0)

## 2020-11-06 MED ORDER — CEFTRIAXONE SODIUM 1 G IJ SOLR
1.0000 g | Freq: Once | INTRAMUSCULAR | Status: AC
  Administered 2020-11-06: 1 g via INTRAMUSCULAR

## 2020-11-06 NOTE — Progress Notes (Signed)
Kaitlyn Parks DOB: 1935/04/07 Encounter date: 11/06/2020  This is a 86 y.o. female who presents with Chief Complaint  Patient presents with  . Dysuria    X4 days    History of present illness: Had UTI at last visit 2/9. Follows with urology.   Burning started sat. Took azo skipped Sunday. Has burning today; not as intense. She hasn't seen urology since she was here last. No blood in urine. Saturday had lower back cramping; no belly pain. No fevers. + nausea with the azo. She did have pineapple which she thinks may have triggered irritation in bladder.   She feels much improved today from Saturday. Just still subtle, there. Not normal.   She denies any vaginal discomfort. Has some dryness. Would not use premarin due to sister who used this and died from ovarian CA, even though she has had complete hysterectomy.    Allergies  Allergen Reactions  . Acyclovir And Related Other (See Comments)    unknown  . Pravastatin Sodium Other (See Comments)    cystitis  . Sulfa Antibiotics     nausea  . Zocor [Simvastatin] Other (See Comments)    cystitis   Current Meds  Medication Sig  . amLODipine (NORVASC) 5 MG tablet Take 1 tablet (5 mg total) by mouth daily.  Marland Kitchen aspirin EC 81 MG tablet Take 81 mg by mouth daily.  . Biotin w/ Vitamins C & E (HAIR/SKIN/NAILS PO) Take 1 tablet by mouth daily.  . Cranberry 400 MG CAPS Take 400 mg by mouth daily.  . Fish Oil-Lutein-D-Zeaxanthin (EYE OMEGA ADVANTAGE/VIT D-3) CAPS Take by mouth daily.  . hydrALAZINE (APRESOLINE) 10 MG tablet 10 mg twice a day, may take extra dose for SBP >160 up to a total of 4 times a day  . isosorbide mononitrate (IMDUR) 60 MG 24 hr tablet TAKE 1 & 1/2 TABLETS A DAY.  . Multiple Vitamins-Calcium (ONE-A-DAY WOMENS PO) Take 1 tablet by mouth daily.   . Multiple Vitamins-Minerals (PRESERVISION AREDS 2 PO) Take 1 capsule by mouth 2 (two) times daily.  . nitroGLYCERIN (NITROSTAT) 0.4 MG SL tablet DISSOLVE 1 TABLET UNDER TONGUE IF  NEEDED FOR CHEST PAIN. MAY REPEAT IN 5 MINUTES FOR 3 DOSES.  Marland Kitchen PRESCRIPTION MEDICATION Avastin eye injections given by Dr Vanessa Barbara due to macular degeneration  . rosuvastatin (CRESTOR) 10 MG tablet TAKE (1/2) TABLET DAILY.  . SYNTHROID 50 MCG tablet TAKE 1 TABLET EACH DAY.   Current Facility-Administered Medications for the 11/06/20 encounter (Office Visit) with Wynn Banker, MD  Medication  . cefTRIAXone (ROCEPHIN) injection 1 g    Review of Systems  Constitutional: Negative for chills, fatigue and fever.  Respiratory: Negative for cough, chest tightness, shortness of breath and wheezing.   Cardiovascular: Negative for chest pain, palpitations and leg swelling.  Gastrointestinal: Negative for nausea.  Genitourinary: Positive for dysuria, frequency and urgency. Negative for flank pain and hematuria.  Musculoskeletal: Positive for back pain (lower).    Objective:  BP 120/80 (BP Location: Left Arm, Patient Position: Sitting, Cuff Size: Normal)   Pulse 77   Temp 97.7 F (36.5 C) (Oral)   Ht 5\' 4"  (1.626 m)   Wt 127 lb 14.4 oz (58 kg)   BMI 21.95 kg/m   Weight: 127 lb 14.4 oz (58 kg)   BP Readings from Last 3 Encounters:  11/06/20 120/80  10/29/20 (!) 142/54  10/12/20 (!) 164/81   Wt Readings from Last 3 Encounters:  11/06/20 127 lb 14.4 oz (58 kg)  10/29/20 127 lb 12.8 oz (58 kg)  10/12/20 130 lb (59 kg)    Physical Exam Constitutional:      General: She is not in acute distress.    Appearance: She is well-developed.  Cardiovascular:     Rate and Rhythm: Normal rate and regular rhythm.     Heart sounds: Normal heart sounds. No murmur heard. No friction rub.  Pulmonary:     Effort: Pulmonary effort is normal. No respiratory distress.     Breath sounds: Normal breath sounds. No wheezing or rales.  Abdominal:     General: Abdomen is flat. Bowel sounds are normal. There is no distension.     Tenderness: Tenderness: mild suprapubic.  Musculoskeletal:     Right lower  leg: No edema.     Left lower leg: No edema.  Neurological:     Mental Status: She is alert and oriented to person, place, and time.  Psychiatric:        Behavior: Behavior normal.     Assessment/Plan  1. Dysuria Will send for culture. She prefers not to take daily abx unless needed; but would prefer this over topical vaginal treatment options, even if indicated. - POC Urinalysis Dipstick - cefTRIAXone (ROCEPHIN) injection 1 g - Urine Culture; Future - Urine Culture  2. Acute cystitis without hematuria Will get culture. Due to significant hx of uti and ongoing symptoms, we are going to treat today with rocephin. Further treatment considerations pending culture result. - Urine Culture; Future - Urine Culture    Return for pending culture.     Theodis Shove, MD

## 2020-11-08 LAB — URINE CULTURE
MICRO NUMBER:: 11738411
SPECIMEN QUALITY:: ADEQUATE

## 2020-12-02 ENCOUNTER — Encounter (INDEPENDENT_AMBULATORY_CARE_PROVIDER_SITE_OTHER): Payer: Medicare Other | Admitting: Ophthalmology

## 2020-12-04 NOTE — Progress Notes (Signed)
[facility] - Clinic Note  12/05/2020     CHIEF COMPLAINT Patient presents for Retina Follow Up   HISTORY OF PRESENT ILLNESS: Kaitlyn Parks is a 86 y.o. female who presents to the clinic today for:   HPI    Retina Follow Up    Patient presents with  Wet AMD.  In left eye.  This started 12 weeks ago.  I, the attending physician,  performed the HPI with the patient and updated documentation appropriately.          Comments    Patient here for 12 weeks retina follow up for exu ARMD OS.  Patient states vision about the same. May be a little fuzzy in the distance. No eye pain.        Last edited by Rennis Chris, MD on 12/09/2020 10:42 PM. (History)    pt states    Referring physician: Wynn Banker, MD [address] [place],  [place] [address]  HISTORICAL INFORMATION:   Selected notes from the MEDICAL RECORD NUMBER Referred by Dr. [place] DeMarco for concern of exu ARMD LEE: 11.26.19 (J. 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 (Ophthalmic Drugs)   No current facility-administered medications for this visit. (Ophthalmic Drugs)   Current Outpatient Medications (Other)  Medication Sig  . amLODipine (NORVASC) 5 MG tablet Take 1 tablet (5 mg total) by mouth daily.  Marland Kitchen aspirin EC 81 MG tablet Take 81 mg by mouth daily.  . Biotin w/ Vitamins C & E (HAIR/SKIN/NAILS PO) Take 1 tablet by mouth daily.  . Cranberry 400 MG CAPS Take 400 mg by mouth daily.  . Fish Oil-Lutein-D-Zeaxanthin (EYE OMEGA ADVANTAGE/VIT D-3) CAPS Take by mouth daily.  . hydrALAZINE (APRESOLINE) 10 MG tablet 10 mg twice a day, may take extra dose for SBP >160 up to a total of 4 times a day  . isosorbide mononitrate (IMDUR) 60 MG 24 hr tablet TAKE 1 & 1/2 TABLETS A DAY.  . Multiple Vitamins-Calcium (ONE-A-DAY WOMENS PO)  Take 1 tablet by mouth daily.   . Multiple Vitamins-Minerals (PRESERVISION AREDS 2 PO) Take 1 capsule by mouth 2 (two) times daily.  . nitroGLYCERIN (NITROSTAT) 0.4 MG SL tablet DISSOLVE 1 TABLET UNDER TONGUE IF NEEDED FOR CHEST PAIN. MAY REPEAT IN 5 MINUTES FOR 3 DOSES.  Marland Kitchen PRESCRIPTION MEDICATION Avastin eye injections given by Dr Vanessa Barbara due to macular degeneration  . rosuvastatin (CRESTOR) 10 MG tablet TAKE (1/2) TABLET DAILY.  . SYNTHROID 50 MCG tablet TAKE 1 TABLET EACH DAY.   No current facility-administered medications for this visit. (Other)      REVIEW OF SYSTEMS: ROS    Positive for: Gastrointestinal, Neurological, Musculoskeletal, Cardiovascular, Eyes   Negative for: Constitutional, Skin, Genitourinary, HENT, Endocrine, Respiratory, Psychiatric, Allergic/Imm, Heme/Lymph   Last edited by Laddie Aquas, COA on 12/05/2020  9:36 AM. (History)       ALLERGIES Allergies  Allergen Reactions  . Acyclovir And Related Other (See Comments)    unknown  . Pravastatin Sodium Other (See Comments)    cystitis  . Sulfa Antibiotics     nausea  . Zocor [Simvastatin] Other (See Comments)    cystitis    PAST MEDICAL HISTORY Past Medical History:  Diagnosis Date  . Arthritis    DDD, scoliosis, sees Dr. Lovell Sheehan for this, uses norco very rarely for pain  .  Bradycardia 11/11/2017  . CAD (coronary artery disease)    LAD stenting of a 90% lesion 2012  . Cystocele   . Educated about COVID-19 virus infection 12/06/2019  . Elevated cholesterol   . GERD (gastroesophageal reflux disease)    dx in work up 2016 for atypical CP at OSH   pt. denies  . Heart murmur   . Hypertensive retinopathy    OU  . Hypothyroidism   . Interstitial cystitis    sees Dr. Annabell HowellsWrenn  . Macular degeneration    OU  . NSTEMI (non-ST elevated myocardial infarction) (HCC)   . Osteoporosis   . Rectocele   . 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 Scoliosis   . Thyroid disease    Hypothyroid  . Urinary  incontinence    USI  . Uterine prolapse    Past Surgical History:  Procedure Laterality Date  . BLADDER SUSPENSION  2011  . CATARACT EXTRACTION Bilateral 2015   Dr. Elmer PickerHecker  . CORONARY ANGIOPLASTY WITH STENT PLACEMENT  04/21/2010   LAD 80%, RCA 30%, nl EF, s/p DES LAD  . EYE SURGERY    . LEFT HEART CATH AND CORONARY ANGIOGRAPHY N/A 06/14/2017   Procedure: LEFT HEART CATH AND CORONARY ANGIOGRAPHY;  Surgeon: Runell GessBerry, Jonathan J, MD;  Location: [facility] INVASIVE [facility];  Service: Cardiovascular;  Laterality: N/A;  . OOPHORECTOMY  2011   BSO  . TOTAL HIP ARTHROPLASTY Right 06/10/2017   Procedure: RIGHT TOTAL HIP ARTHROPLASTY ANTERIOR APPROACH;  Surgeon: Samson FredericSwinteck, Cathyann Kilfoyle, MD;  Location: [facility] OR[facility];  Service: Orthopedics;  Laterality: Right;  Needs RNFA  . VAGINAL HYSTERECTOMY  2011   [facility] BSO; benign    FAMILY HISTORY Family History  Problem Relation Age of Onset  . Hypertension Mother   . Heart disease Mother   . Heart disease Father   . Heart disease Sister   . Colon cancer Paternal Aunt 2075  . Breast cancer Paternal Aunt        Age 84's  . Diabetes Sister   . Endometrial cancer Sister   . Breast cancer Cousin        Maternal 1st cousins-Age 84's  . Leukemia Paternal Aunt     SOCIAL HISTORY Social History   Tobacco Use  . Smoking status: Never Smoker  . Smokeless tobacco: Never Used  Vaping Use  . Vaping Use: Never used  Substance Use Topics  . Alcohol use: No    Comment: Rare  . Drug use: No         OPHTHALMIC EXAM:  Base Eye Exam    Visual Acuity (Snellen - Linear)      Right Left   Dist [place] 20/30 -2 20/40 +1   Dist ph [place] 20/30 +2        Tonometry (Tonopen, 9:34 AM)      Right Left   Pressure 10 11       Pupils      Dark Light Shape React APD   Right 3 2 Round Brisk None   Left 3 2 Round Brisk None       Visual Fields (Counting fingers)      Left Right    Full Full       Extraocular Movement      Right Left    Full, Ortho Full, Ortho        Neuro/Psych    Oriented x3: Yes   Mood/Affect: Normal       Dilation    Both eyes:  1.0% Mydriacyl, 2.5% Phenylephrine @ 9:34 AM        Slit Lamp and Fundus Exam    Slit Lamp Exam      Right Left   Lids/Lashes mild Telangiectasia, marginal leasion nasal UL Telangiectasia, Meibomian gland dysfunction   Conjunctiva/Sclera White and quiet White and quiet   Cornea Arcus, 3+ Punctate epithelial erosions 3+ Punctate epithelial erosions, Arcus   Anterior Chamber Deep and clear Deep and clear   Iris Round and dilated Round and well dilated   Lens PC IOL in good position, trace Posterior capsular opacification (linear, extending just to visual axis) PC IOL in excellent position   Vitreous Vitreous syneresis, Posterior vitreous detachment Vitreous syneresis, Posterior vitreous detachment, vitreous condensations       Fundus Exam      Right Left   Disc Pink and Sharp, Compact Pink and Sharp, mild temporal PPA   C/D Ratio 0.2 0.3   Macula Blunted foveal reflex, Drusen, RPE mottling and clumping, early Atrophy, +PEDs -- stably improved, stable improvement in central cyst / IRF, No heme Blunted foveal reflex, +drusen, pigment clumping, RPE mottling, clumping and early atrophy, no heme, new central PED / CNV with overlying cystic changes   Vessels Vascular attenuation Vascular attenuation   Periphery Attached, scattered reticular degeneration   Attached, scattered reticular degeneration        Refraction    Manifest Refraction      Sphere Cylinder Axis Dist VA   Right [place] +0.50 008 20/30+2   Left -0.75 Sphere  20/30-2          IMAGING AND PROCEDURES  Imaging and Procedures for @TODAY @  OCT, Retina - OU - Both Eyes       Right Eye Quality was good. Central Foveal Thickness: 243. Progression has improved. Findings include retinal drusen , pigment epithelial detachment, outer retinal atrophy, intraretinal hyper-reflective material, no SRF, no IRF, abnormal foveal contour (Stable  improvement in PED; trace cystic changes -- slightly improved, persistent ORA and IRHM).   Left Eye Quality was good. Central Foveal Thickness: 409. Progression has worsened. Findings include retinal drusen , outer retinal atrophy, pigment epithelial detachment, intraretinal hyper-reflective material, normal foveal contour, intraretinal fluid, subretinal fluid (Interval development of central PED and IRF overlying).   Notes *Images captured and stored on drive  Diagnosis / Impression:  OD: exudative ARMD -- Stable improvement in PED; trace cystic changes -- slightly improved, persistent ORA and IRHM OS: exu-ARMD -- Interval development of central PED and IRF overlying  Clinical management:  See below  Abbreviations: NFP - Normal foveal profile. CME - cystoid macular edema. PED - pigment epithelial detachment. IRF - intraretinal fluid. SRF - subretinal fluid. EZ - ellipsoid zone. ERM - epiretinal membrane. ORA - outer retinal atrophy. ORT - outer retinal tubulation. SRHM - subretinal hyper-reflective material        Intravitreal Injection, Pharmacologic Agent - OD - Right Eye       Time Out 12/05/2020. 10:01 AM. Confirmed correct patient, procedure, site, and patient consented.   Anesthesia Topical anesthesia was used. Anesthetic medications included Lidocaine 2%, Proparacaine 0.5%.   Procedure Preparation included 5% betadine to ocular surface, eyelid speculum. A supplied needle was used.   Injection:  1.25 mg Bevacizumab (AVASTIN) 1.25mg /0.32mL SOLN   NDC: 0m, Lot: [id number] , Expiration date: 01/11/2021   Route: Intravitreal, Site: Right Eye, Waste: 0 mL  Post-op Post injection exam found visual acuity of at least counting fingers. The patient tolerated the procedure well. There  were no complications. The patient received written and verbal post procedure care education. Post injection medications were not given.        Intravitreal Injection, Pharmacologic Agent -  OS - Left Eye       Time Out 12/05/2020. 10:02 AM. Confirmed correct patient, procedure, site, and patient consented.   Anesthesia Topical anesthesia was used. Anesthetic medications included Lidocaine 2%, Proparacaine 0.5%.   Procedure Preparation included 5% betadine to ocular surface, eyelid speculum. A (32g) needle was used.   Injection:  1.25 mg Bevacizumab (AVASTIN) 1.25mg /0.20mL SOLN   NDC: 60737-106-26, Lot: [id number], Expiration date: 12/22/2020   Route: Intravitreal, Site: Left Eye, Waste: 0.05 mL  Post-op Post injection exam found visual acuity of at least counting fingers. The patient tolerated the procedure well. There were no complications. The patient received written and verbal post procedure care education. Post injection medications were not given.                 ASSESSMENT/PLAN:    ICD-10-CM   1. Exudative age-related macular degeneration of left eye with active choroidal neovascularization (HCC)  H35.3221 Intravitreal Injection, Pharmacologic Agent - OS - Left Eye    Bevacizumab (AVASTIN) SOLN 1.25 mg  2. Retinal edema  H35.81 OCT, Retina - OU - Both Eyes  3. Exudative age-related macular degeneration of right eye with active choroidal neovascularization (HCC)  H35.3211 Intravitreal Injection, Pharmacologic Agent - OD - Right Eye    Bevacizumab (AVASTIN) SOLN 1.25 mg  4. Essential hypertension  I10   5. Hypertensive retinopathy of both eyes  H35.033   6. Pseudophakia of both eyes  Z96.1   7. Dry eyes  H04.123     1,2. Exudative age-related macular degeneration, left eye.    - OCT 4.30.2020 showed interval conversion of OS from nonexudative ARMD to exu-ARMD with large dome-shaped PED with overlying IRF/SRF--stably improved  - FA 05.28.20 -- no active CNV OS, just staining  - s/p IVA OS #1 (04.30.20), #2 (05.28.20), #3 (06.26.20), #4 (08.03.20), #5 (09.11.20), #6 (10.19.20), #7 (11.23.20)  - today, OCT shows interval development of central PED and IRF  overlying  - BCVA decreased to 20/40 from 20/25  - recommend IVA OS #8 today, 05.05.22 for reactivated CNV  - pt wishes to proceed with injection  - RBA of procedure discussed, questions answered  - informed consent obtained and signed  - see procedure note  - benefits investigation for Eylea initiated 4.30.2020 -- approved for 2022  - f/u in 4 wks -- DFE/OCT  3. Age related macular degeneration, exudative, OD  - interval development of IRF first noted on 09.11.20 -- conversion from nonexudative to exudative ARMD  - pt initially presented on 11.27.19 due to alert from [facility] home monitoring system for OD  - [facility] prescribed by Dr. Elmer Picker  - FA (5.28.2020) showed staining / window defect corresponding temporal RPE changes OU -- no active CNV OU  - S/P IVA OD #1 (09.11.20), #2 (10.19.20), #3 (11.23.20), #4 (01.07.21), #5 (2.19.21), #6 (04.02.21), #7 (05.28.21), #8 (07.23.21), #9 (09.24.21), #10 (11.29.21), #11 (02.11.22)  - benefits investigation for Eylea initiated 4.30.2020 -- approved for 2021  - OCT shows stable resolution of PED/IRF/SRF, +ORA  -- stable at 11 wk interval  - BCVA 20/30  - recommend IVA OD #12 today, 05.05.22 -- maintenance, w/ f/u in 12 wks  - pt wishes to proceed  - RBA of procedure discussed, questions answered  - informed consent obtained  - see procedure note  -  f/u 12 weeks (07.28.22) -- DFE/OCT; possible injection; tx and ext as able  4,5. Hypertensive retinopathy OU  - discussed importance of tight BP control  - monitor  6. Pseudophakia OU  - s/p CE/IOL OU  - beautiful surgeries, doing well  - monitor  7. Dry eyes OU - recommend artificial tears and lubricating ointment as needed    Ophthalmic Meds Ordered this visit:  Meds ordered this encounter  Medications  . Bevacizumab (AVASTIN) SOLN 1.25 mg  . Bevacizumab (AVASTIN) SOLN 1.25 mg       Return in about 4 weeks (around 01/02/2021) for f/u exu ARMD OS, DFE, OCT.  There are no Patient  Instructions on file for this visit.  This document serves as a record of services personally performed by Karie Chimera, MD, PhD. It was created on their behalf by Joni Reining, an ophthalmic technician. The creation of this record is the provider's dictation and/or activities during the visit.    Electronically signed by: Joni Reining COA, 12/09/20  10:44 PM   This document serves as a record of services personally performed by Karie Chimera, MD, PhD. It was created on their behalf by Glee Arvin. Manson Passey, OA an ophthalmic technician. The creation of this record is the provider's dictation and/or activities during the visit.    Electronically signed by: Glee Arvin. Manson Passey, [place] 05.05.2022 10:44 PM  Karie Chimera, M.D., Ph.D. Diseases & Surgery of the Retina and Vitreous [facility] 12/05/2020  I have reviewed the above documentation for accuracy and completeness, and I agree with the above. Karie Chimera, M.D., Ph.D. 12/09/20 10:48 PM   Abbreviations: M myopia (nearsighted); A astigmatism; H hyperopia (farsighted); P presbyopia; Mrx spectacle prescription;  CTL contact lenses; OD right eye; OS left eye; OU both eyes  XT exotropia; ET esotropia; PEK punctate epithelial keratitis; PEE punctate epithelial erosions; DES dry eye syndrome; MGD meibomian gland dysfunction; ATs artificial tears; PFAT's preservative free artificial tears; NSC nuclear sclerotic cataract; PSC posterior subcapsular cataract; ERM epi-retinal membrane; PVD posterior vitreous detachment; RD retinal detachment; DM diabetes mellitus; DR diabetic retinopathy; NPDR non-proliferative diabetic retinopathy; PDR proliferative diabetic retinopathy; CSME clinically significant macular edema; DME diabetic macular edema; dbh dot blot hemorrhages; CWS cotton wool spot; POAG primary open angle glaucoma; C/D cup-to-disc ratio; HVF humphrey visual field; GVF goldmann visual field; OCT optical coherence tomography; IOP  intraocular pressure; BRVO Branch retinal vein occlusion; CRVO central retinal vein occlusion; CRAO central retinal artery occlusion; BRAO branch retinal artery occlusion; RT retinal tear; SB scleral buckle; PPV pars plana vitrectomy; VH Vitreous hemorrhage; PRP panretinal laser photocoagulation; IVK intravitreal kenalog; VMT vitreomacular traction; MH Macular hole;  NVD neovascularization of the disc; NVE neovascularization elsewhere; AREDS age related eye disease study; ARMD age related macular degeneration; POAG primary open angle glaucoma; EBMD epithelial/anterior basement membrane dystrophy; ACIOL anterior chamber intraocular lens; IOL intraocular lens; PCIOL posterior chamber intraocular lens; Phaco/IOL phacoemulsification with intraocular lens placement; PRK photorefractive keratectomy; LASIK laser assisted in situ keratomileusis; HTN hypertension; DM diabetes mellitus; COPD chronic obstructive pulmonary disease

## 2020-12-05 ENCOUNTER — Encounter (INDEPENDENT_AMBULATORY_CARE_PROVIDER_SITE_OTHER): Payer: Self-pay | Admitting: Ophthalmology

## 2020-12-05 ENCOUNTER — Other Ambulatory Visit: Payer: Self-pay

## 2020-12-05 ENCOUNTER — Ambulatory Visit (INDEPENDENT_AMBULATORY_CARE_PROVIDER_SITE_OTHER): Payer: Medicare Other | Admitting: Ophthalmology

## 2020-12-05 DIAGNOSIS — Z961 Presence of intraocular lens: Secondary | ICD-10-CM

## 2020-12-05 DIAGNOSIS — H35033 Hypertensive retinopathy, bilateral: Secondary | ICD-10-CM

## 2020-12-05 DIAGNOSIS — H353211 Exudative age-related macular degeneration, right eye, with active choroidal neovascularization: Secondary | ICD-10-CM | POA: Diagnosis not present

## 2020-12-05 DIAGNOSIS — H04123 Dry eye syndrome of bilateral lacrimal glands: Secondary | ICD-10-CM

## 2020-12-05 DIAGNOSIS — H3581 Retinal edema: Secondary | ICD-10-CM

## 2020-12-05 DIAGNOSIS — H353221 Exudative age-related macular degeneration, left eye, with active choroidal neovascularization: Secondary | ICD-10-CM

## 2020-12-05 DIAGNOSIS — I10 Essential (primary) hypertension: Secondary | ICD-10-CM | POA: Diagnosis not present

## 2020-12-09 ENCOUNTER — Encounter (INDEPENDENT_AMBULATORY_CARE_PROVIDER_SITE_OTHER): Payer: Self-pay | Admitting: Ophthalmology

## 2020-12-09 DIAGNOSIS — H353211 Exudative age-related macular degeneration, right eye, with active choroidal neovascularization: Secondary | ICD-10-CM

## 2020-12-09 DIAGNOSIS — H353221 Exudative age-related macular degeneration, left eye, with active choroidal neovascularization: Secondary | ICD-10-CM | POA: Diagnosis not present

## 2020-12-09 MED ORDER — BEVACIZUMAB CHEMO INJECTION 1.25MG/0.05ML SYRINGE FOR KALEIDOSCOPE
1.2500 mg | INTRAVITREAL | Status: AC | PRN
  Administered 2020-12-09: 1.25 mg via INTRAVITREAL

## 2020-12-12 ENCOUNTER — Ambulatory Visit (HOSPITAL_COMMUNITY)
Admission: EM | Admit: 2020-12-12 | Discharge: 2020-12-12 | Disposition: A | Discharge status: Home / Self Care | Payer: Medicare Other | Attending: Family | Admitting: Family

## 2020-12-12 ENCOUNTER — Other Ambulatory Visit: Payer: Self-pay

## 2020-12-12 ENCOUNTER — Ambulatory Visit (INDEPENDENT_AMBULATORY_CARE_PROVIDER_SITE_OTHER): Payer: Medicare Other

## 2020-12-12 ENCOUNTER — Encounter (HOSPITAL_COMMUNITY): Payer: Self-pay

## 2020-12-12 DIAGNOSIS — R5383 Other fatigue: Secondary | ICD-10-CM

## 2020-12-12 DIAGNOSIS — R0789 Other chest pain: Secondary | ICD-10-CM

## 2020-12-12 DIAGNOSIS — R059 Cough, unspecified: Secondary | ICD-10-CM

## 2020-12-12 DIAGNOSIS — R001 Bradycardia, unspecified: Secondary | ICD-10-CM

## 2020-12-12 DIAGNOSIS — Z20822 Contact with and (suspected) exposure to covid-19: Secondary | ICD-10-CM

## 2020-12-12 DIAGNOSIS — U071 COVID-19: Secondary | ICD-10-CM

## 2020-12-12 LAB — SARS CORONAVIRUS 2 (TAT 6-24 HRS): SARS Coronavirus 2: POSITIVE — AB

## 2020-12-12 MED ORDER — MOLNUPIRAVIR EUA 200MG CAPSULE: 4.0000 | ORAL_CAPSULE | Freq: Two times a day (BID) | ORAL | 0 refills | Status: DC

## 2020-12-12 MED ORDER — MOLNUPIRAVIR EUA 200MG CAPSULE: 4.0000 | ORAL_CAPSULE | Freq: Two times a day (BID) | ORAL | 0 refills | Status: AC

## 2020-12-12 NOTE — ED Provider Notes (Signed)
[facility]    CSN: [id number] Arrival date & time: 12/12/20  1006      History   Chief Complaint Chief Complaint  Patient presents with  . Cough    COVID +  . chest heaviness    HPI Kaitlyn Parks is a 86 y.o. female.   86 year old female presents with sore throat, cough and fatigue that started yesterday afternoon. Now having some nasal and chest congestion, headache and nausea. Chest feels tight and heavy but no distinct pain. Slightly lightheaded. No syncope. Daughter just tested positive for COVID 19 and the patient took a home COVID test last night which was positive. Contacted her PCP who told her she should go to the ER. She denies any distinct fever, vomiting or diarrhea. Having body aches and history of arthritis and took Aleve last night with minimal relief. Has been vaccinated against COVID 19 and had one booster last year. Was planning to get 2nd booster recently but was not feeling well so decided to postpone booster for a few weeks. Other chronic health issues include HTN, CAD with history of MI and stent placement, hyperlipidemia, thyroid disorder, and chronic cystitis. Currently on Norvasc, Imdur, hydralazine, Synthroid, Crestor, aspirin, fish oil and various supplements daily as well as eye injections for macular degeneration and Nitrostat prn.   The history is provided by the patient.    Past Medical History:  Diagnosis Date  . Arthritis    DDD, scoliosis, sees Dr. Lovell Sheehan for this, uses norco very rarely for pain  . Bradycardia 11/11/2017  . CAD (coronary artery disease)    LAD stenting of a 90% lesion 2012  . Cystocele   . Educated about COVID-19 virus infection 12/06/2019  . Elevated cholesterol   . GERD (gastroesophageal reflux disease)    dx in work up 2016 for atypical CP at OSH   pt. denies  . Heart murmur   . Hypertensive retinopathy    OU  . Hypothyroidism   . Interstitial cystitis    sees Dr. [name]  . Macular degeneration    OU  .  NSTEMI (non-ST elevated myocardial infarction) (HCC)   . Osteoporosis   . Rectocele   . 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 Scoliosis   . Thyroid disease    Hypothyroid  . Urinary incontinence    USI  . Uterine prolapse     Patient Active Problem List   Diagnosis Date Noted  . Aortic atherosclerosis (HCC) 12/07/2019  . Macular degeneration 01/11/2019  . Coronary artery disease involving native coronary artery of native heart without angina pectoris 10/29/2018  . Presence of intraocular lens 09/23/2018  . Bradycardia 11/11/2017  . Dyslipidemia 11/11/2017  . Pain in joint of right hip 08/04/2017  . CAD (coronary artery disease) 06/12/2017  . GERD (gastroesophageal reflux disease) 06/12/2017  . 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 Osteoarthritis of right hip 06/10/2017  . CAD S/P percutaneous coronary angioplasty 02/11/2015  . Renal insufficiency 02/11/2015  . Prolonged Q-T interval on ECG 04/18/2010  . Hypertension 08/11/2007  . Hypothyroidism (acquired) 06/08/2007  . MITRAL VALVE PROLAPSE 06/08/2007    Past Surgical History:  Procedure Laterality Date  . BLADDER SUSPENSION  2011  . CATARACT EXTRACTION Bilateral 2015   Dr. Elmer Picker  . CORONARY ANGIOPLASTY WITH STENT PLACEMENT  04/21/2010   LAD 80%, RCA 30%, nl EF, s/p DES LAD  . EYE SURGERY    . LEFT HEART CATH AND CORONARY ANGIOGRAPHY N/A 06/14/2017  Procedure: LEFT HEART CATH AND CORONARY ANGIOGRAPHY;  Surgeon: Runell Gess, MD;  Location: [facility];  Service: Cardiovascular;  Laterality: N/A;  . OOPHORECTOMY  2011   BSO  . TOTAL HIP ARTHROPLASTY Right 06/10/2017   Procedure: RIGHT TOTAL HIP ARTHROPLASTY ANTERIOR APPROACH;  Surgeon: Samson Frederic, MD;  Location: [facility];  Service: Orthopedics;  Laterality: Right;  Needs RNFA  . VAGINAL HYSTERECTOMY  2011   [facility] BSO; benign    OB History    Gravida  2   Para  2   Term  2   Preterm      AB      Living  2     SAB      IAB       Ectopic      Multiple      Live Births               Home Medications    Prior to Admission medications   Medication Sig Start Date End Date Taking? Authorizing Provider  amLODipine (NORVASC) 5 MG tablet Take 1 tablet (5 mg total) by mouth daily. 10/12/20   Glori Luis, MD  aspirin EC 81 MG tablet Take 81 mg by mouth daily.    [provider]  Biotin w/ Vitamins C & E (HAIR/SKIN/NAILS PO) Take 1 tablet by mouth daily.    [provider]  Cranberry 400 MG CAPS Take 400 mg by mouth daily.    [provider]  Fish Oil-Lutein-D-Zeaxanthin (EYE OMEGA ADVANTAGE/VIT D-3) CAPS Take by mouth daily.    [provider]  hydrALAZINE (APRESOLINE) 10 MG tablet 10 mg twice a day, may take extra dose for SBP >160 up to a total of 4 times a day 05/29/20   Azalee Course, PA  isosorbide mononitrate (IMDUR) 60 MG 24 hr tablet TAKE 1 & 1/2 TABLETS A DAY. 09/12/20   Koberlein, Paris Lore, MD  molnupiravir EUA 200 mg CAPS Take 4 capsules (800 mg total) by mouth 2 (two) times daily for 5 days. Patient meets criteria- due to patient's age and risk for severe disease, along with chronic medical conditions 12/12/20 12/17/20  Sudie Grumbling, NP  Multiple Vitamins-Calcium (ONE-A-DAY WOMENS PO) Take 1 tablet by mouth daily.     [provider]  Multiple Vitamins-Minerals (PRESERVISION AREDS 2 PO) Take 1 capsule by mouth 2 (two) times daily.    [provider]  nitroGLYCERIN (NITROSTAT) 0.4 MG SL tablet DISSOLVE 1 TABLET UNDER TONGUE IF NEEDED FOR CHEST PAIN. MAY REPEAT IN 5 MINUTES FOR 3 DOSES. 06/21/20   Rollene Rotunda, MD  PRESCRIPTION MEDICATION Avastin eye injections given by Dr Vanessa Barbara due to macular degeneration    [provider]  rosuvastatin (CRESTOR) 10 MG tablet TAKE (1/2) TABLET DAILY. 10/02/20   Rollene Rotunda, MD  SYNTHROID 50 MCG tablet TAKE 1 TABLET EACH DAY. 10/31/20   Wynn Banker, MD    Family History Family History  Problem  Relation Age of Onset  . Hypertension Mother   . Heart disease Mother   . Heart disease Father   . Heart disease Sister   . Colon cancer Paternal Aunt 63  . Breast cancer Paternal Aunt        Age 78's  . Diabetes Sister   . Endometrial cancer Sister   . Breast cancer Cousin        Maternal 1st cousins-Age 6's  . Leukemia Paternal Aunt     Social History  Social History   Tobacco Use  . Smoking status: Never Smoker  . Smokeless tobacco: Never Used  Vaping Use  . Vaping Use: Never used  Substance Use Topics  . Alcohol use: No    Comment: Rare  . Drug use: No     Allergies   Acyclovir and related, Pravastatin sodium, Sulfa antibiotics, and Zocor [simvastatin]   Review of Systems Review of Systems  Constitutional: Positive for activity change, appetite change and fatigue. Negative for chills, diaphoresis and fever (but has felt warm).  HENT: Positive for congestion, postnasal drip and sore throat. Negative for ear discharge, ear pain, facial swelling, mouth sores, nosebleeds, rhinorrhea, sinus pressure, sinus pain and trouble swallowing.   Eyes: Negative for photophobia, pain, discharge and redness.  Respiratory: Positive for cough and chest tightness. Negative for shortness of breath, wheezing and stridor.   Cardiovascular: Negative for chest pain and palpitations.  Gastrointestinal: Positive for nausea. Negative for diarrhea and vomiting.  Musculoskeletal: Positive for arthralgias and myalgias. Negative for neck pain and neck stiffness.  Skin: Negative for color change and rash.  Allergic/Immunologic: Negative for environmental allergies and food allergies.  Neurological: Positive for light-headedness and headaches. Negative for dizziness, tremors, seizures, syncope, facial asymmetry, speech difficulty, weakness and numbness.  Hematological: Negative for adenopathy. Does not bruise/bleed easily.     Physical Exam Triage Vital Signs ED Triage Vitals  Enc Vitals Group      BP 12/12/20 1046 (!) 155/64     Pulse Rate 12/12/20 1046 74     Resp 12/12/20 1046 (!) 24     Temp 12/12/20 1046 97.7 F (36.5 C)     Temp Source 12/12/20 1046 Oral     SpO2 12/12/20 1046 99 %     Weight --      Height --      Head Circumference --      Peak Flow --      Pain Score 12/12/20 1042 6     Pain Loc --      Pain Edu? --      Excl. in GC? --    No data found.  Updated Vital Signs BP (!) 155/64 (BP Location: Right Arm)   Pulse 74   Temp 97.7 F (36.5 C) (Oral)   Resp (!) 24   SpO2 99%   Visual Acuity Right Eye Distance:   Left Eye Distance:   Bilateral Distance:    Right Eye Near:   Left Eye Near:    Bilateral Near:     Physical Exam Vitals and nursing note reviewed.  Constitutional:      General: She is awake. She is not in acute distress.    Appearance: She is well-developed and well-groomed. She is ill-appearing.     Comments: She is sitting comfortably on the exam table in no acute distress, talking in complete sentences, but appears tired and ill.   HENT:     Head: Normocephalic and atraumatic.     Right Ear: Hearing, tympanic membrane, ear canal and external ear normal.     Left Ear: Hearing, tympanic membrane, ear canal and external ear normal.     Nose: Congestion present.     Right Sinus: No maxillary sinus tenderness or frontal sinus tenderness.     Left Sinus: No maxillary sinus tenderness or frontal sinus tenderness.     Mouth/Throat:     Lips: Pink.     Mouth: Mucous membranes are moist.     Pharynx: Uvula midline.  Posterior oropharyngeal erythema present. No pharyngeal swelling, oropharyngeal exudate or uvula swelling.  Eyes:     Extraocular Movements: Extraocular movements intact.     Conjunctiva/sclera: Conjunctivae normal.  Cardiovascular:     Rate and Rhythm: Normal rate and regular rhythm.     Heart sounds: Normal heart sounds. No murmur heard.     Comments: Heart rate = 62 during exam.  Pulmonary:     Effort: Pulmonary  effort is normal. No prolonged expiration, respiratory distress or retractions.     Breath sounds: Normal air entry. No stridor. Examination of the left-upper field reveals decreased breath sounds. Examination of the left-middle field reveals decreased breath sounds. Examination of the left-lower field reveals decreased breath sounds. Decreased breath sounds present. No wheezing, rhonchi or rales.     Comments: Respiratory rate = 18 during exam. No dyspnea or visible tachypnea.  Musculoskeletal:        General: Normal range of motion.     Cervical back: Normal range of motion and neck supple. No rigidity or tenderness.  Lymphadenopathy:     Cervical: No cervical adenopathy.  Skin:    General: Skin is warm and dry.     Capillary Refill: Capillary refill takes less than 2 seconds.     Findings: No rash.  Neurological:     General: No focal deficit present.     Mental Status: She is alert and oriented to person, place, and time.     Motor: Motor function is intact.     Gait: Gait is intact.  Psychiatric:        Mood and Affect: Mood normal.        Behavior: Behavior normal. Behavior is cooperative.        Thought Content: Thought content normal.        Judgment: Judgment normal.      UC Treatments / Results  Labs (all labs ordered are listed, but only abnormal results are displayed) Labs Reviewed  SARS CORONAVIRUS 2 (TAT 6-24 HRS) - Abnormal; Notable for the following components:      Result Value   SARS Coronavirus 2 POSITIVE (*)    All other components within normal limits    EKG   Radiology DG Chest 2 View  Result Date: 12/12/2020 CLINICAL DATA:  Chest tightness, cough, positive home COVID test EXAM: CHEST - 2 VIEW COMPARISON:  10/09/2018 FINDINGS: The heart size and mediastinal contours are within normal limits. Both lungs are clear. Disc degenerative disease of the thoracic spine. IMPRESSION: No acute abnormality of the lungs. Electronically Signed   By: Lauralyn PrimesAlex  Bibbey M.D.    On: 12/12/2020 12:21    Procedures ED EKG  Date/Time: 12/12/2020 11:27 AM Performed by: Sudie GrumblingAmyot, Kalise Fickett Berry, NP Authorized by: Sudie GrumblingAmyot, Sumire Halbleib Berry, NP   ECG reviewed by ED Physician in the absence of a cardiologist: no   Previous ECG:    Previous ECG:  Compared to current Interpretation:    Interpretation: normal   Rate:    ECG rate:  45   ECG rate assessment: bradycardic   Rhythm:    Rhythm: sinus rhythm   Ectopy:    Ectopy: none   QRS:    QRS axis:  Normal ST segments:    ST segments:  Normal Q waves:    Abnormal Q-waves: not present   Comments:     Heart rate = 45 during ECG tracing but was 74 on initial triage and 62 during exam. Patient is awake and talking in complete  sentences and is not in distress.    (including critical care time)  Medications Ordered in UC Medications - No data to display  Initial Impression / Assessment and Plan / UC Course  I have reviewed the triage vital signs and the nursing notes.  Pertinent labs & imaging results that were available during my care of the patient were reviewed by me and considered in my medical decision making (see chart for details).    Reviewed ECG results with patient- bradycardia during ECG but normal heart rate during exam. No other distinct abnormalities present. Reviewed chest x-ray results with patient- no pneumonia or fluid in lungs. Discussed various treatment options for probable COVID 19 infection. Patient is stable and is a candidate for outpatient antiviral treatment. Patient desire to trial Molnupiravir  twice a day for 5 days- attempted to print out Rx but Computer issues and will not print prescription. [facility] and they do not carry oral COVID 19 antiviral medications. Sent Rx electronically to [facility] on [address]. Continue to push fluids to stay well hydrated and eat small, frequent meals to help with nausea. Offered anti-nausea medication but patient declined. May take OTC Delsym 2  teaspoons every 12 hours as needed for cough. Continue to monitor symptoms. Stay home for at least 5 days if lab test confirms positive home COVID test. Patient has neighbors and other contacts who will continue to check on patient since she lives alone. If any increased dizziness, fatigue occur or any chest pain, difficulty breathing occurs, call 911 and go to the ER ASAP. Otherwise, follow-up with your PCP next week for recheck.  Final Clinical Impressions(s) / UC Diagnoses   Final diagnoses:  Cough  Suspected COVID-19 virus infection  Bradycardia  Other fatigue     Discharge Instructions     Recommend start Molnupiravir  twice a day as directed. Continue to push fluids to stay well hydrated and eat small, frequent meals to help with nausea. May take OTC Delsym cough medication 2 teaspoons every 12 hours as needed. Continue to monitor symptoms. Stay at home for at least 5 days. If any increased dizziness, fatigue, chest pain, difficulty breathing occur or worsen, call 911 and go to the ER ASAP. Otherwise, follow-up with your PCP next week for recheck.      ED Prescriptions    Medication Sig Dispense Auth. Provider   molnupiravir EUA 200 mg CAPS  (Status: Discontinued) Take 4 capsules (800 mg total) by mouth 2 (two) times daily for 5 days. Patient meets criteria- due to patient's age and risk for severe disease, along with chronic medical conditions 40 capsule Shanaya Schneck, Ali Lowe, NP   molnupiravir EUA 200 mg CAPS Take 4 capsules (800 mg total) by mouth 2 (two) times daily for 5 days. Patient meets criteria- due to patient's age and risk for severe disease, along with chronic medical conditions 40 capsule Shallyn Constancio, Ali Lowe, NP     PDMP not reviewed this encounter.   Sudie Grumbling, NP 12/12/20 2005

## 2020-12-12 NOTE — Discharge Instructions (Addendum)
Recommend start Molnupiravir 800mg  twice a day as directed. Continue to push fluids to stay well hydrated and eat small, frequent meals to help with nausea. May take OTC Delsym cough medication 2 teaspoons every 12 hours as needed. Continue to monitor symptoms. Stay at home for at least 5 days. If any increased dizziness, fatigue, chest pain, difficulty breathing occur or worsen, call 911 and go to the ER ASAP. Otherwise, follow-up with your PCP next week for recheck.

## 2020-12-12 NOTE — ED Triage Notes (Signed)
Pt reports cough and chest heaviness x 1 day. Pt reports she had a positive COVID home test last night.   Pt reports she was told by her PCP to go to the ED.

## 2021-01-02 NOTE — Progress Notes (Addendum)
[facility] - Clinic Note  01/06/2021     CHIEF COMPLAINT Patient presents for Retina Follow Up   HISTORY OF PRESENT ILLNESS: Kaitlyn Parks is a 86 y.o. female who presents to the clinic today for:   HPI    Retina Follow Up    Patient presents with  Wet AMD.  In left eye.  Duration of 4 weeks.  Since onset it is gradually worsening.  I, the attending physician,  performed the HPI with the patient and updated documentation appropriately.          Comments    4 week follow up Exu ARMD OS-  At night her eye is hard to open OS, the eye hurts.  She uses Systane Gel and Soothe 5-6 x/d. Gradually, visio has worsen over the years.  She still drives but not long distances.  Using Impiris drops, suppose to be BID but she doesn't always do BID.  She has tried Restasis and [place] without improvement.  Patient did have COVID since her last visit.  Seems like she is having some memory problems since then.        Last edited by Rennis ChrisZamora, Tikia Skilton, MD on 01/06/2021 11:30 PM. (History)    pt states    Referring physician: Wynn BankerKoberlein, Junell C, MD [address] [place],  [place] [id number]  HISTORICAL INFORMATION:   Selected notes from the MEDICAL RECORD NUMBER Referred by Dr. SwazilandJordan DeMarco for concern of exu ARMD LEE: 11.26.19 (J. 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 (Ophthalmic Drugs)   No current facility-administered medications for this visit. (Ophthalmic Drugs)   Current Outpatient Medications (Other)  Medication Sig  . amLODipine (NORVASC) 5 MG tablet Take 1 tablet (5 mg total) by mouth daily.  Marland Kitchen. aspirin EC 81 MG tablet Take 81 mg by mouth daily.  . Biotin w/ Vitamins C & E (HAIR/SKIN/NAILS PO) Take 1 tablet by mouth daily.  . Cranberry 400 MG CAPS Take 400 mg by mouth daily.  . Fish  Oil-Lutein-D-Zeaxanthin (EYE OMEGA ADVANTAGE/VIT D-3) CAPS Take by mouth daily.  . hydrALAZINE (APRESOLINE) 10 MG tablet 10 mg twice a day, may take extra dose for SBP >160 up to a total of 4 times a day  . isosorbide mononitrate (IMDUR) 60 MG 24 hr tablet TAKE 1 & 1/2 TABLETS A DAY.  . Multiple Vitamins-Calcium (ONE-A-DAY WOMENS PO) Take 1 tablet by mouth daily.   . Multiple Vitamins-Minerals (PRESERVISION AREDS 2 PO) Take 1 capsule by mouth 2 (two) times daily.  . nitroGLYCERIN (NITROSTAT) 0.4 MG SL tablet DISSOLVE 1 TABLET UNDER TONGUE IF NEEDED FOR CHEST PAIN. MAY REPEAT IN 5 MINUTES FOR 3 DOSES.  Marland Kitchen. PRESCRIPTION MEDICATION Avastin eye injections given by Dr Vanessa BarbaraZamora due to macular degeneration  . rosuvastatin (CRESTOR) 10 MG tablet TAKE (1/2) TABLET DAILY.  . SYNTHROID 50 MCG tablet TAKE 1 TABLET EACH DAY.   No current facility-administered medications for this visit. (Other)      REVIEW OF SYSTEMS: ROS    Positive for: Gastrointestinal, Neurological, Musculoskeletal, Cardiovascular, Eyes   Negative for: Constitutional, Skin, Genitourinary, HENT, Endocrine, Respiratory, Psychiatric, Allergic/Imm, Heme/Lymph   Last edited by Joni ReiningHodges, Robin, COA on 01/06/2021  1:18 PM. (History)       ALLERGIES Allergies  Allergen Reactions  . Acyclovir And Related Other (See Comments)    unknown  .  Pravastatin Sodium Other (See Comments)    cystitis  . Sulfa Antibiotics     nausea  . Zocor [Simvastatin] Other (See Comments)    cystitis    PAST MEDICAL HISTORY Past Medical History:  Diagnosis Date  . Arthritis    DDD, scoliosis, sees Dr. Lovell Sheehan for this, uses norco very rarely for pain  . Bradycardia 11/11/2017  . CAD (coronary artery disease)    LAD stenting of a 90% lesion 2012  . Cystocele   . Educated about COVID-19 virus infection 12/06/2019  . Elevated cholesterol   . GERD (gastroesophageal reflux disease)    dx in work up 2016 for atypical CP at OSH   pt. denies  . Heart murmur    . Hypertensive retinopathy    OU  . Hypothyroidism   . Interstitial cystitis    sees Dr. [name]  . Macular degeneration    OU  . NSTEMI (non-ST elevated myocardial infarction) (HCC)   . Osteoporosis   . Rectocele   . 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 Scoliosis   . Thyroid disease    Hypothyroid  . Urinary incontinence    USI  . Uterine prolapse    Past Surgical History:  Procedure Laterality Date  . BLADDER SUSPENSION  2011  . CATARACT EXTRACTION Bilateral 2015   Dr. Elmer Picker  . CORONARY ANGIOPLASTY WITH STENT PLACEMENT  04/21/2010   LAD 80%, RCA 30%, nl EF, s/p DES LAD  . EYE SURGERY    . LEFT HEART CATH AND CORONARY ANGIOGRAPHY N/A 06/14/2017   Procedure: LEFT HEART CATH AND CORONARY ANGIOGRAPHY;  Surgeon: Runell Gess, MD;  Location: [facility];  Service: Cardiovascular;  Laterality: N/A;  . OOPHORECTOMY  2011   BSO  . TOTAL HIP ARTHROPLASTY Right 06/10/2017   Procedure: RIGHT TOTAL HIP ARTHROPLASTY ANTERIOR APPROACH;  Surgeon: Samson Frederic, MD;  Location: [facility];  Service: Orthopedics;  Laterality: Right;  Needs RNFA  . VAGINAL HYSTERECTOMY  2011   LAVH BSO; benign    FAMILY HISTORY Family History  Problem Relation Age of Onset  . Hypertension Mother   . Heart disease Mother   . Heart disease Father   . Heart disease Sister   . Colon cancer Paternal Aunt 64  . Breast cancer Paternal Aunt        Age 34's  . Diabetes Sister   . Endometrial cancer Sister   . Breast cancer Cousin        Maternal 1st cousins-Age 36's  . Leukemia Paternal Aunt     SOCIAL HISTORY Social History   Tobacco Use  . Smoking status: Never Smoker  . Smokeless tobacco: Never Used  Vaping Use  . Vaping Use: Never used  Substance Use Topics  . Alcohol use: No    Comment: Rare  . Drug use: No         OPHTHALMIC EXAM:  Base Eye Exam    Visual Acuity (Snellen - Linear)      Right Left   Dist [place] 20/40 +1 20/30 +1   Dist ph [place] NI NI       Tonometry  (Tonopen, 1:28 PM)      Right Left   Pressure 14 17       Pupils      Dark Light Shape React APD   Right 3 2 Round Brisk None   Left 3 2 Round Brisk None       Visual Fields (Counting fingers)  Left Right    Full Full       Extraocular Movement      Right Left    Full Full       Neuro/Psych    Oriented x3: Yes   Mood/Affect: Normal       Dilation    Both eyes: 1.0% Mydriacyl, 2.5% Phenylephrine @ 1:28 PM        Slit Lamp and Fundus Exam    Slit Lamp Exam      Right Left   Lids/Lashes mild Telangiectasia, marginal leasion nasal UL Telangiectasia, Meibomian gland dysfunction   Conjunctiva/Sclera White and quiet White and quiet   Cornea Arcus, 3+ Punctate epithelial erosions 3+ Punctate epithelial erosions, Arcus   Anterior Chamber Deep and clear Deep and clear   Iris Round and dilated Round and well dilated   Lens PC IOL in good position, trace Posterior capsular opacification (linear, extending just to visual axis) PC IOL in excellent position   Vitreous Vitreous syneresis, Posterior vitreous detachment Vitreous syneresis, Posterior vitreous detachment, vitreous condensations       Fundus Exam      Right Left   Disc Pink and Sharp, Compact Pink and Sharp, mild temporal PPA   C/D Ratio 0.2 0.3   Macula Blunted foveal reflex, Drusen, RPE mottling and clumping, early Atrophy, +PEDs -- stably improved, stable improvement in central cyst / IRF, No heme Blunted foveal reflex, +drusen, pigment clumping, RPE mottling, clumping and early atrophy, no heme, new central PED / CNV with overlying cystic changes -- all resolved   Vessels Vascular attenuation Vascular attenuation   Periphery Attached, scattered reticular degeneration   Attached, scattered reticular degeneration        Refraction    Manifest Refraction      Sphere Dist VA   Right [place] NI   Left            IMAGING AND PROCEDURES  Imaging and Procedures for @TODAY @  OCT, Retina - OU - Both Eyes        Right Eye Quality was good. Central Foveal Thickness: 237. Progression has been stable. Findings include retinal drusen , pigment epithelial detachment, outer retinal atrophy, intraretinal hyper-reflective material, no SRF, no IRF, abnormal foveal contour (Stable improvement in PED; trace cystic changes -- slightly improved, persistent ORA and IRHM).   Left Eye Quality was good. Central Foveal Thickness: 232. Progression has improved. Findings include retinal drusen , outer retinal atrophy, pigment epithelial detachment, intraretinal hyper-reflective material, normal foveal contour, no IRF, no SRF (Interval improvement / resolution of central PED and IRF overlying, interval improvement in foveal contour).   Notes *Images captured and stored on drive  Diagnosis / Impression:  OD: exudative ARMD -- Stable improvement in PED; trace cystic changes -- slightly improved, persistent ORA and IRHM OS: exu-ARMD -- Interval improvement / resolution of central PED and IRF overlying, interval improvement in foveal contour  Clinical management:  See below  Abbreviations: NFP - Normal foveal profile. CME - cystoid macular edema. PED - pigment epithelial detachment. IRF - intraretinal fluid. SRF - subretinal fluid. EZ - ellipsoid zone. ERM - epiretinal membrane. ORA - outer retinal atrophy. ORT - outer retinal tubulation. SRHM - subretinal hyper-reflective material        Intravitreal Injection, Pharmacologic Agent - OS - Left Eye       Time Out 01/06/2021. 1:55 PM. Confirmed correct patient, procedure, site, and patient consented.   Anesthesia Topical anesthesia was used. Anesthetic medications included Lidocaine 2%,  Proparacaine 0.5%.   Procedure Preparation included 5% betadine to ocular surface, eyelid speculum. A supplied (32g) needle was used.   Injection:  1.25 mg Bevacizumab (AVASTIN) 1.25mg /0.66mL SOLN   NDC: P3213405, Lot: [id number] , Expiration date: 02/12/2021   Route:  Intravitreal, Site: Left Eye, Waste: 0 mL  Post-op Post injection exam found visual acuity of at least counting fingers. The patient tolerated the procedure well. There were no complications. The patient received written and verbal post procedure care education. Post injection medications were not given.                 ASSESSMENT/PLAN:    ICD-10-CM   1. Exudative age-related macular degeneration of left eye with active choroidal neovascularization (HCC)  H35.3221 Intravitreal Injection, Pharmacologic Agent - OS - Left Eye    Bevacizumab (AVASTIN) SOLN 1.25 mg  2. Retinal edema  H35.81 OCT, Retina - OU - Both Eyes  3. Exudative age-related macular degeneration of right eye with active choroidal neovascularization (HCC)  H35.3211   4. Essential hypertension  I10   5. Hypertensive retinopathy of both eyes  H35.033   6. Pseudophakia of both eyes  Z96.1   7. Dry eyes  H04.123     1,2. Exudative age-related macular degeneration, left eye.    - OCT 4.30.2020 showed interval conversion of OS from nonexudative ARMD to exu-ARMD with large dome-shaped PED with overlying IRF/SRF--improved  - FA 05.28.20 -- no active CNV OS, just staining  - s/p IVA OS #1 (04.30.20), #2 (05.28.20), #3 (06.26.20), #4 (08.03.20), #5 (09.11.20), #6 (10.19.20), #7 (11.23.20),  - reactivation of CNV noted on 05.05.22 -- s/p IVA OS  #8 (05.05.22)  - today, OCT shows Interval improvement / resolution of central PED and IRF overlying, interval improvement in foveal contour  - BCVA improved to 20/30 from 20/40  - recommend IVA OS #9 today, 07.05.22 for reactivated CNV  - pt wishes to proceed with injection  - RBA of procedure discussed, questions answered  - informed consent obtained and signed  - see procedure note  - benefits investigation for Eylea initiated 4.30.2020 -- approved for 2022  - f/u in 6 wks -- DFE/OCT  3. Age related macular degeneration, exudative, OD  - interval development of IRF first  noted on 09.11.20 -- conversion from nonexudative to exudative ARMD  - pt initially presented on 11.27.19 due to alert from [facility] home monitoring system for OD  - [facility] prescribed by Dr. 11.29.19  - FA (5.28.2020) showed staining / window defect corresponding temporal RPE changes OU -- no active CNV OU  - S/P IVA OD #1 (09.11.20), #2 (10.19.20), #3 (11.23.20), #4 (01.07.21), #5 (2.19.21), #6 (04.02.21), #7 (05.28.21), #8 (07.23.21), #9 (09.24.21), #10 (11.29.21), #11 (02.11.22), #12 (05.05.22)  - benefits investigation for Eylea initiated 4.30.2020 -- approved for 2021  - OCT shows stable resolution of PED/IRF/SRF, +ORA  -- stable at 11 wk interval  - BCVA 20/30  - recommend holding IVA OD today -- due for next maintenance shot around 07.28.22 (12 wks from 5.5.22)  - f/u 12 weeks (07.28.22) -- DFE/OCT; possible injection; tx and ext as able  4,5. Hypertensive retinopathy OU  - discussed importance of tight BP control  - monitor  6. Pseudophakia OU  - s/p CE/IOL OU  - beautiful surgeries, doing well  - monitor  7. Dry eyes OU - recommend artificial tears and lubricating ointment as needed    Ophthalmic Meds Ordered this visit:  Meds ordered this encounter  Medications  .  Bevacizumab (AVASTIN) SOLN 1.25 mg       Return in about 6 weeks (around 02/17/2021) for f/u exu ARMD OS, DFE, OCT.  There are no Patient Instructions on file for this visit.  This document serves as a record of services personally performed by Karie Chimera, MD, PhD. It was created on their behalf by Herby Abraham, COA, an ophthalmic technician. The creation of this record is the provider's dictation and/or activities during the visit.    Electronically signed by: Herby Abraham, COA @TODAY @ 11:42 PM  , M.D., Ph.D. Diseases & Surgery of the Retina and Vitreous [facility] 01/06/2021   I have reviewed the above documentation for accuracy and completeness, and I  agree with the above. 03/08/2021, M.D., Ph.D. 01/06/21 11:42 PM   Abbreviations: M myopia (nearsighted); A astigmatism; H hyperopia (farsighted); P presbyopia; Mrx spectacle prescription;  CTL contact lenses; OD right eye; OS left eye; OU both eyes  XT exotropia; ET esotropia; PEK punctate epithelial keratitis; PEE punctate epithelial erosions; DES dry eye syndrome; MGD meibomian gland dysfunction; ATs artificial tears; PFAT's preservative free artificial tears; NSC nuclear sclerotic cataract; PSC posterior subcapsular cataract; ERM epi-retinal membrane; PVD posterior vitreous detachment; RD retinal detachment; DM diabetes mellitus; DR diabetic retinopathy; NPDR non-proliferative diabetic retinopathy; PDR proliferative diabetic retinopathy; CSME clinically significant macular edema; DME diabetic macular edema; dbh dot blot hemorrhages; CWS cotton wool spot; POAG primary open angle glaucoma; C/D cup-to-disc ratio; HVF humphrey visual field; GVF goldmann visual field; OCT optical coherence tomography; IOP intraocular pressure; BRVO Branch retinal vein occlusion; CRVO central retinal vein occlusion; CRAO central retinal artery occlusion; BRAO branch retinal artery occlusion; RT retinal tear; SB scleral buckle; PPV pars plana vitrectomy; VH Vitreous hemorrhage; PRP panretinal laser photocoagulation; IVK intravitreal kenalog; VMT vitreomacular traction; MH Macular hole;  NVD neovascularization of the disc; NVE neovascularization elsewhere; AREDS age related eye disease study; ARMD age related macular degeneration; POAG primary open angle glaucoma; EBMD epithelial/anterior basement membrane dystrophy; ACIOL anterior chamber intraocular lens; IOL intraocular lens; PCIOL posterior chamber intraocular lens; Phaco/IOL phacoemulsification with intraocular lens placement; PRK photorefractive keratectomy; LASIK laser assisted in situ keratomileusis; HTN hypertension; DM diabetes mellitus; COPD chronic obstructive  pulmonary disease

## 2021-01-03 ENCOUNTER — Encounter (INDEPENDENT_AMBULATORY_CARE_PROVIDER_SITE_OTHER): Payer: Medicare Other | Admitting: Ophthalmology

## 2021-01-06 ENCOUNTER — Encounter (INDEPENDENT_AMBULATORY_CARE_PROVIDER_SITE_OTHER): Payer: Self-pay | Admitting: Ophthalmology

## 2021-01-06 ENCOUNTER — Ambulatory Visit (INDEPENDENT_AMBULATORY_CARE_PROVIDER_SITE_OTHER): Payer: Medicare Other | Admitting: Ophthalmology

## 2021-01-06 ENCOUNTER — Other Ambulatory Visit: Payer: Self-pay

## 2021-01-06 DIAGNOSIS — H353211 Exudative age-related macular degeneration, right eye, with active choroidal neovascularization: Secondary | ICD-10-CM | POA: Diagnosis not present

## 2021-01-06 DIAGNOSIS — H35033 Hypertensive retinopathy, bilateral: Secondary | ICD-10-CM

## 2021-01-06 DIAGNOSIS — H353132 Nonexudative age-related macular degeneration, bilateral, intermediate dry stage: Secondary | ICD-10-CM

## 2021-01-06 DIAGNOSIS — I10 Essential (primary) hypertension: Secondary | ICD-10-CM | POA: Diagnosis not present

## 2021-01-06 DIAGNOSIS — H3581 Retinal edema: Secondary | ICD-10-CM | POA: Diagnosis not present

## 2021-01-06 DIAGNOSIS — H04123 Dry eye syndrome of bilateral lacrimal glands: Secondary | ICD-10-CM

## 2021-01-06 DIAGNOSIS — H353221 Exudative age-related macular degeneration, left eye, with active choroidal neovascularization: Secondary | ICD-10-CM | POA: Diagnosis not present

## 2021-01-06 DIAGNOSIS — H353112 Nonexudative age-related macular degeneration, right eye, intermediate dry stage: Secondary | ICD-10-CM

## 2021-01-06 DIAGNOSIS — Z961 Presence of intraocular lens: Secondary | ICD-10-CM

## 2021-01-06 MED ORDER — BEVACIZUMAB CHEMO INJECTION 1.25MG/0.05ML SYRINGE FOR KALEIDOSCOPE
1.2500 mg | INTRAVITREAL | Status: AC | PRN
  Administered 2021-01-06: 1.25 mg via INTRAVITREAL

## 2021-02-18 ENCOUNTER — Other Ambulatory Visit: Payer: Self-pay

## 2021-02-18 ENCOUNTER — Ambulatory Visit (INDEPENDENT_AMBULATORY_CARE_PROVIDER_SITE_OTHER): Payer: Medicare Other | Admitting: Ophthalmology

## 2021-02-18 ENCOUNTER — Encounter (INDEPENDENT_AMBULATORY_CARE_PROVIDER_SITE_OTHER): Payer: Self-pay | Admitting: Ophthalmology

## 2021-02-18 DIAGNOSIS — I10 Essential (primary) hypertension: Secondary | ICD-10-CM | POA: Diagnosis not present

## 2021-02-18 DIAGNOSIS — H353221 Exudative age-related macular degeneration, left eye, with active choroidal neovascularization: Secondary | ICD-10-CM

## 2021-02-18 DIAGNOSIS — H04123 Dry eye syndrome of bilateral lacrimal glands: Secondary | ICD-10-CM

## 2021-02-18 DIAGNOSIS — H353231 Exudative age-related macular degeneration, bilateral, with active choroidal neovascularization: Secondary | ICD-10-CM

## 2021-02-18 DIAGNOSIS — H35033 Hypertensive retinopathy, bilateral: Secondary | ICD-10-CM

## 2021-02-18 DIAGNOSIS — H3581 Retinal edema: Secondary | ICD-10-CM | POA: Diagnosis not present

## 2021-02-18 DIAGNOSIS — H353211 Exudative age-related macular degeneration, right eye, with active choroidal neovascularization: Secondary | ICD-10-CM

## 2021-02-18 DIAGNOSIS — Z961 Presence of intraocular lens: Secondary | ICD-10-CM

## 2021-02-18 MED ORDER — BEVACIZUMAB CHEMO INJECTION 1.25MG/0.05ML SYRINGE FOR KALEIDOSCOPE
1.2500 mg | INTRAVITREAL | Status: AC | PRN
  Administered 2021-02-18: 1.25 mg via INTRAVITREAL

## 2021-02-18 NOTE — Progress Notes (Signed)
[facility] Note  02/18/2021     CHIEF COMPLAINT Patient presents for Retina Follow Up   HISTORY OF PRESENT ILLNESS: Kaitlyn Parks is a 86 y.o. female who presents to the clinic today for:   HPI     Retina Follow Up   Patient presents with  Wet AMD.  In left eye.  Duration of 6 weeks.  Since onset it is stable.  I, the attending physician,  performed the HPI with the patient and updated documentation appropriately.        Comments   Pt here for 6 wk ret f/u exu ARMD OS. Pt states she called and spoke to [place] regarding watery eyes OU. She has followed the recommendations she received and is currently using Systane Dry Eye Gel, Soothe Vials and Systane Complete OU. She does report an improvement OU. No issues vision wise reported. No ocular pain or discomfort.       Last edited by Bernarda Caffey, MD on 02/18/2021  4:33 PM.    Pt's OS feels irritated; eyes water a lot.  Referring physician: Caren Macadam, MD [place],  [place] [address]  HISTORICAL INFORMATION:   Selected notes from the MEDICAL RECORD NUMBER Referred by Dr. Martinique DeMarco for concern of exu ARMD LEE: 11.26.19 (J. 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 (Ophthalmic Drugs)   No current facility-administered medications for this visit. (Ophthalmic Drugs)   Current Outpatient Medications (Other)  Medication Sig   amLODipine (NORVASC) 5 MG tablet Take 1 tablet (5 mg total) by mouth daily.   aspirin EC 81 MG tablet Take 81 mg by mouth daily.   Biotin w/ Vitamins C & E (HAIR/SKIN/NAILS PO) Take 1 tablet by mouth daily.   Cranberry 400 MG CAPS Take 400 mg by mouth daily.   Fish Oil-Lutein-D-Zeaxanthin (EYE OMEGA ADVANTAGE/VIT D-3) CAPS Take by mouth daily.   hydrALAZINE (APRESOLINE) 10 MG tablet  10 mg twice a day, may take extra dose for SBP >160 up to a total of 4 times a day   isosorbide mononitrate (IMDUR) 60 MG 24 hr tablet TAKE 1 & 1/2 TABLETS A DAY.   Multiple Vitamins-Calcium (ONE-A-DAY WOMENS PO) Take 1 tablet by mouth daily.    Multiple Vitamins-Minerals (PRESERVISION AREDS 2 PO) Take 1 capsule by mouth 2 (two) times daily.   nitroGLYCERIN (NITROSTAT) 0.4 MG SL tablet DISSOLVE 1 TABLET UNDER TONGUE IF NEEDED FOR CHEST PAIN. MAY REPEAT IN 5 MINUTES FOR 3 DOSES.   PRESCRIPTION MEDICATION Avastin eye injections given by Dr Coralyn Pear due to macular degeneration   rosuvastatin (CRESTOR) 10 MG tablet TAKE (1/2) TABLET DAILY.   SYNTHROID 50 MCG tablet TAKE 1 TABLET EACH DAY.   No current facility-administered medications for this visit. (Other)      REVIEW OF SYSTEMS: ROS   Positive for: Gastrointestinal, Neurological, Musculoskeletal, Cardiovascular, Eyes Negative for: Constitutional, Skin, Genitourinary, HENT, Endocrine, Respiratory, Psychiatric, Allergic/Imm, Heme/Lymph Last edited by Kingsley Spittle, COT on 02/18/2021  1:14 PM.        ALLERGIES Allergies  Allergen Reactions   Acyclovir And Related Other (See Comments)    unknown   Pravastatin Sodium Other (See Comments)    cystitis   Sulfa Antibiotics     nausea   Zocor [Simvastatin] Other (See Comments)    cystitis  PAST MEDICAL HISTORY Past Medical History:  Diagnosis Date   Arthritis    DDD, scoliosis, sees Dr. Arnoldo Morale for this, uses norco very rarely for pain   Bradycardia 11/11/2017   CAD (coronary artery disease)    LAD stenting of a 90% lesion 2012   Cystocele    Educated about COVID-19 virus infection 12/06/2019   Elevated cholesterol    GERD (gastroesophageal reflux disease)    dx in work up 2016 for atypical CP at OSH   pt. denies   Heart murmur    Hypertensive retinopathy    OU   Hypothyroidism    Interstitial cystitis    sees Dr. Jeffie Pollock   Macular degeneration    OU   NSTEMI (non-ST  elevated myocardial infarction) [facility])    Osteoporosis    Rectocele    S/P hip replacement, right 06/12/2017   Scoliosis    Thyroid disease    Hypothyroid   Urinary incontinence    USI   Uterine prolapse    Past Surgical History:  Procedure Laterality Date   BLADDER SUSPENSION  2011   CATARACT EXTRACTION Bilateral 2015   Dr. Herbert Deaner   CORONARY ANGIOPLASTY WITH STENT PLACEMENT  04/21/2010   LAD 80%, RCA 30%, nl EF, s/p DES LAD   EYE SURGERY     LEFT HEART CATH AND CORONARY ANGIOGRAPHY N/A 06/14/2017   Procedure: LEFT HEART CATH AND CORONARY ANGIOGRAPHY;  Surgeon: Lorretta Harp, MD;  Location: [facility];  Service: Cardiovascular;  Laterality: N/A;   OOPHORECTOMY  2011   BSO   TOTAL HIP ARTHROPLASTY Right 06/10/2017   Procedure: RIGHT TOTAL HIP ARTHROPLASTY ANTERIOR APPROACH;  Surgeon: Rod Can, MD;  Location: [facility];  Service: Orthopedics;  Laterality: Right;  Needs RNFA   VAGINAL HYSTERECTOMY  2011   LAVH BSO; benign    FAMILY HISTORY Family History  Problem Relation Age of Onset   Hypertension Mother    Heart disease Mother    Heart disease Father    Heart disease Sister    Colon cancer Paternal Aunt 60   Breast cancer Paternal 33        Age 22's   Diabetes Sister    Endometrial cancer Sister    Breast cancer Cousin        Maternal 1st cousins-Age 50's   Leukemia Paternal Aunt     SOCIAL HISTORY Social History   Tobacco Use   Smoking status: Never   Smokeless tobacco: Never  Vaping Use   Vaping Use: Never used  Substance Use Topics   Alcohol use: No    Comment: Rare   Drug use: No         OPHTHALMIC EXAM:  Base Eye Exam     Visual Acuity (Snellen - Linear)       Right Left   Dist [place] 20/40 20/30   Dist ph cc 20/40 +1 NI         Tonometry (Tonopen, 1:33 PM)       Right Left   Pressure 14 13         Pupils       Dark Light Shape React APD   Right 3 2 Round Brisk None   Left 3 2 Round Brisk None         Visual  Fields (Counting fingers)       Left Right    Full Full         Extraocular Movement  Right Left    Full, Ortho Full, Ortho         Neuro/Psych     Oriented x3: Yes   Mood/Affect: Normal         Dilation     Both eyes: 1.0% Mydriacyl, 2.5% Phenylephrine @ 1:33 PM           Slit Lamp and Fundus Exam     Slit Lamp Exam       Right Left   Lids/Lashes mild Telangiectasia, marginal leasion nasal UL Telangiectasia, Meibomian gland dysfunction, lower lid edema   Conjunctiva/Sclera White and quiet White and quiet, inferior conj chalasis   Cornea Arcus, 2+ Punctate epithelial erosions 3+ inferior punctate epithelial erosions, Arcus   Anterior Chamber Deep and clear Deep and clear   Iris Round and dilated Round and well dilated   Lens PC IOL in good position, trace Posterior capsular opacification (linear, extending just to visual axis) PC IOL in excellent position   Vitreous Vitreous syneresis, Posterior vitreous detachment Vitreous syneresis, Posterior vitreous detachment, vitreous condensations         Fundus Exam       Right Left   Disc Pink and Sharp, Compact Pink and Sharp, mild temporal PPA   C/D Ratio 0.2 0.3   Macula Blunted foveal reflex, Drusen, RPE mottling and clumping, early Atrophy, +PEDs -- stably improved, stable improvement in central cyst / IRF, No heme Blunted foveal reflex, +drusen, pigment clumping, RPE mottling, clumping and early atrophy, no heme, new central PED / CNV with overlying cystic changes -- all stably resolved   Vessels Vascular attenuation Vascular attenuation   Periphery Attached, scattered reticular degeneration   Attached, scattered reticular degeneration            IMAGING AND PROCEDURES  Imaging and Procedures for _0 @  OCT, Retina - OU - Both Eyes       Right Eye Quality was good. Central Foveal Thickness: 236. Progression has been stable. Findings include retinal drusen , pigment epithelial detachment,  outer retinal atrophy, intraretinal hyper-reflective material, no SRF, no IRF, abnormal foveal contour.   Left Eye Quality was good. Central Foveal Thickness: 229. Progression has been stable. Findings include retinal drusen , outer retinal atrophy, pigment epithelial detachment, intraretinal hyper-reflective material, normal foveal contour, no IRF, no SRF (stable improvement / resolution of central PED and IRF overlying, stable improvement in foveal contour).   Notes *Images captured and stored on drive  Diagnosis / Impression:  OD: exudative ARMD -- Stable improvement in PED and trace cystic changes; persistent ORA and IRHM OS: exu-ARMD -- stable improvement / resolution of central PED and IRF overlying, stable improvement in foveal contour  Clinical management:  See below  Abbreviations: NFP - Normal foveal profile. CME - cystoid macular edema. PED - pigment epithelial detachment. IRF - intraretinal fluid. SRF - subretinal fluid. EZ - ellipsoid zone. ERM - epiretinal membrane. ORA - outer retinal atrophy. ORT - outer retinal tubulation. SRHM - subretinal hyper-reflective material      Intravitreal Injection, Pharmacologic Agent - OD - Right Eye       Time Out 02/18/2021. 2:24 PM. Confirmed correct patient, procedure, site, and patient consented.   Anesthesia Topical anesthesia was used. Anesthetic medications included Lidocaine 2%, Proparacaine 0.5%.   Procedure Preparation included 5% betadine to ocular surface, eyelid speculum. A (32g) needle was used.   Injection: 1.25 mg Bevacizumab 1.13m/0.05ml   Route: Intravitreal, Site: Right Eye   NDC: 5H061816 Lot:: [id number] Expiration date: 03/31/2021,  Waste: 0.05 mL   Post-op Post injection exam found visual acuity of at least counting fingers. The patient tolerated the procedure well. There were no complications. The patient received written and verbal post procedure care education. Post injection medications were not given.       Intravitreal Injection, Pharmacologic Agent - OS - Left Eye       Time Out 02/18/2021. 2:25 PM. Confirmed correct patient, procedure, site, and patient consented.   Anesthesia Topical anesthesia was used. Anesthetic medications included Lidocaine 2%, Proparacaine 0.5%.   Procedure Preparation included 5% betadine to ocular surface, eyelid speculum. A (32g) needle was used.   Injection: 1.25 mg Bevacizumab 1.103m/0.05ml   Route: Intravitreal, Site: Left Eye   NDC: 5H061816 Lot:: [id number] Expiration date: 03/14/2021, Waste: 0.05 mL   Post-op Post injection exam found visual acuity of at least counting fingers. The patient tolerated the procedure well. There were no complications. The patient received written and verbal post procedure care education. Post injection medications were not given.               ASSESSMENT/PLAN:    ICD-10-CM   1. Exudative age-related macular degeneration of left eye with active choroidal neovascularization (HCC)  H35.3221 Intravitreal Injection, Pharmacologic Agent - OS - Left Eye    Bevacizumab (AVASTIN) SOLN 1.25 mg    2. Retinal edema  H35.81 OCT, Retina - OU - Both Eyes    3. Exudative age-related macular degeneration of right eye with active choroidal neovascularization (HCC)  H35.3211 Intravitreal Injection, Pharmacologic Agent - OD - Right Eye    Bevacizumab (AVASTIN) SOLN 1.25 mg    4. Essential hypertension  I10     5. Hypertensive retinopathy of both eyes  H35.033     6. Pseudophakia of both eyes  Z96.1     7. Dry eyes  H04.123        1,2. Exudative age-related macular degeneration, left eye.    - OCT 4.30.2020 showed interval conversion of OS from nonexudative ARMD to exu-ARMD with large dome-shaped PED with overlying IRF/SRF--improved  - FA 05.28.20 -- no active CNV OS, just staining  - s/p IVA OS #1 (04.30.20), #2 (05.28.20), #3 (06.26.20), #4 (08.03.20), #5 (09.11.20), #6 (10.19.20), #7 (11.23.20),  -  reactivation of CNV noted on 05.05.22 -- s/p IVA OS  #8 (05.05.22), #9 (6.6.22)  - today, OCT shows stable improvement / resolution of central PED and IRF overlying, stable improvement in foveal contour at 6 wks  - BCVA stable at 20/30 today  - recommend IVA OS #10 today, 7.19.22  -- mainteance w/ ext to 8 wks  - pt wishes to proceed with injection  - RBA of procedure discussed, questions answered  - informed consent obtained and signed  - see procedure note  - benefits investigation for E[employer] initiated 4.30.2020 -- approved for 2022  - f/u in 8 wks -- DFE/OCT, possible injection  3. Age related macular degeneration, exudative, OD  - interval development of IRF first noted on 09.11.20 -- conversion from nonexudative to exudative ARMD  - pt initially presented on 11.27.19 due to alert from [facility] home monitoring system for OD  - [facility] prescribed by Dr. HHerbert Deaner - FA (5.28.2020) showed staining / window defect corresponding temporal RPE changes OU -- no active CNV OU  - S/P IVA OD #1 (09.11.20), #2 (10.19.20), #3 (11.23.20), #4 (01.07.21), #5 (2.19.21), #6 (04.02.21), #7 (05.28.21), #8 (07.23.21), #9 (09.24.21), #10 (11.29.21), #11 (02.11.22), #12 (05.05.22)  - benefits investigation for  Eyle[employer] initiated 4.30.2020 -- approved for 2021  - OCT shows stable resolution of PED/IRF/SRF, +ORA  -- stable at 11 wk interval  - BCVA 20/40  - recommend IVA OD #13 today, 7.19.22             - Risks and benefits of tx discussed w/pt.             - Pt desires to proceed w/tx today  - f/u 8 weeks as above -- DFE/OCT; tx and ext as able  4,5. Hypertensive retinopathy OU  - discussed importance of tight BP control  - monitor  6. Pseudophakia OU  - s/p CE/IOL OU  - beautiful surgeries, doing well  - monitor  7. Dry eyes OU - recommend artificial tears and lubricating ointment as needed    Ophthalmic Meds Ordered this visit:  Meds ordered this encounter  Medications   Bevacizumab (AVASTIN) SOLN  1.25 mg   Bevacizumab (AVASTIN) SOLN 1.25 mg        Return in about 8 weeks (around 04/15/2021) for 8 wk f/u w/DFE/OCT/likely inj..  There are no Patient Instructions on file for this visit.  This document serves as a record of services personally performed by Gardiner Sleeper, MD, PhD. It was created on their behalf by Estill Bakes, COT an ophthalmic technician. The creation of this record is the provider's dictation and/or activities during the visit.    Electronically signed by: Estill Bakes, COT 7.18.22 @ 4:39 PM   This document serves as a record of services personally performed by Gardiner Sleeper, MD, PhD. It was created on their behalf by Estill Bakes, COT an ophthalmic technician. The creation of this record is the provider's dictation and/or activities during the visit.    Electronically signed by: Estill Bakes, [place] 7.19.22 @ 4:39 PM   Gardiner Sleeper, M.D., Ph.D. Diseases & Surgery of the Retina and [place] 7.19.22  I have reviewed the above documentation for accuracy and completeness, and I agree with the above. Gardiner Sleeper, M.D., Ph.D. 02/18/21 4:39 PM   Abbreviations: M myopia (nearsighted); A astigmatism; H hyperopia (farsighted); P presbyopia; Mrx spectacle prescription;  CTL contact lenses; OD right eye; OS left eye; OU both eyes  XT exotropia; ET esotropia; PEK punctate epithelial keratitis; PEE punctate epithelial erosions; DES dry eye syndrome; MGD meibomian gland dysfunction; ATs artificial tears; PFAT's preservative free artificial tears; Ruffin nuclear sclerotic cataract; PSC posterior subcapsular cataract; ERM epi-retinal membrane; PVD posterior vitreous detachment; RD retinal detachment; DM diabetes mellitus; DR diabetic retinopathy; NPDR non-proliferative diabetic retinopathy; PDR proliferative diabetic retinopathy; CSME clinically significant macular edema; DME diabetic macular edema; dbh dot blot hemorrhages; CWS cotton  wool spot; POAG primary open angle glaucoma; C/D cup-to-disc ratio; HVF humphrey visual field; GVF goldmann visual field; OCT optical coherence tomography; IOP intraocular pressure; BRVO Branch retinal vein occlusion; CRVO central retinal vein occlusion; CRAO central retinal artery occlusion; BRAO branch retinal artery occlusion; RT retinal tear; SB scleral buckle; PPV pars plana vitrectomy; VH Vitreous hemorrhage; PRP panretinal laser photocoagulation; IVK intravitreal kenalog; VMT vitreomacular traction; MH Macular hole;  NVD neovascularization of the disc; NVE neovascularization elsewhere; AREDS age related eye disease study; ARMD age related macular degeneration; POAG primary open angle glaucoma; EBMD epithelial/anterior basement membrane dystrophy; ACIOL anterior chamber intraocular lens; IOL intraocular lens; PCIOL posterior chamber intraocular lens; Phaco/IOL phacoemulsification with intraocular lens placement; Stark photorefractive keratectomy; LASIK laser assisted in situ keratomileusis; HTN hypertension; DM diabetes mellitus; COPD  chronic obstructive pulmonary disease  

## 2021-03-05 ENCOUNTER — Other Ambulatory Visit: Payer: Self-pay

## 2021-03-05 ENCOUNTER — Other Ambulatory Visit (INDEPENDENT_AMBULATORY_CARE_PROVIDER_SITE_OTHER): Payer: Medicare Other

## 2021-03-05 DIAGNOSIS — I2510 Atherosclerotic heart disease of native coronary artery without angina pectoris: Secondary | ICD-10-CM

## 2021-03-05 DIAGNOSIS — E785 Hyperlipidemia, unspecified: Secondary | ICD-10-CM | POA: Diagnosis not present

## 2021-03-05 DIAGNOSIS — R7309 Other abnormal glucose: Secondary | ICD-10-CM

## 2021-03-05 DIAGNOSIS — E039 Hypothyroidism, unspecified: Secondary | ICD-10-CM

## 2021-03-05 DIAGNOSIS — I10 Essential (primary) hypertension: Secondary | ICD-10-CM

## 2021-03-05 LAB — CBC WITH DIFFERENTIAL/PLATELET
Basophils Absolute: 0.1 10*3/uL (ref 0.0–0.1)
Basophils Relative: 1.1 % (ref 0.0–3.0)
Eosinophils Absolute: 0.1 10*3/uL (ref 0.0–0.7)
Eosinophils Relative: 2.2 % (ref 0.0–5.0)
HCT: 40.5 % (ref 36.0–46.0)
Hemoglobin: 13.4 g/dL (ref 12.0–15.0)
Lymphocytes Relative: 22 % (ref 12.0–46.0)
Lymphs Abs: 1.1 10*3/uL (ref 0.7–4.0)
MCHC: 33.1 g/dL (ref 30.0–36.0)
MCV: 94.5 fl (ref 78.0–100.0)
Monocytes Absolute: 0.5 10*3/uL (ref 0.1–1.0)
Monocytes Relative: 11.1 % (ref 3.0–12.0)
Neutro Abs: 3 10*3/uL (ref 1.4–7.7)
Neutrophils Relative %: 63.6 % (ref 43.0–77.0)
Platelets: 247 10*3/uL (ref 150.0–400.0)
RBC: 4.29 Mil/uL (ref 3.87–5.11)
RDW: 13.1 % (ref 11.5–15.5)
WBC: 4.8 10*3/uL (ref 4.0–10.5)

## 2021-03-05 LAB — COMPREHENSIVE METABOLIC PANEL
ALT: 13 U/L (ref 0–35)
AST: 15 U/L (ref 0–37)
Albumin: 3.9 g/dL (ref 3.5–5.2)
Alkaline Phosphatase: 55 U/L (ref 39–117)
BUN: 18 mg/dL (ref 6–23)
CO2: 28 mEq/L (ref 19–32)
Calcium: 9.1 mg/dL (ref 8.4–10.5)
Chloride: 105 mEq/L (ref 96–112)
Creatinine, Ser: 0.77 mg/dL (ref 0.40–1.20)
GFR: 71.07 mL/min (ref >60.00)
Glucose, Bld: 81 mg/dL (ref 70–99)
Potassium: 4.1 mEq/L (ref 3.5–5.1)
Sodium: 141 mEq/L (ref 135–145)
Total Bilirubin: 0.4 mg/dL (ref 0.2–1.2)
Total Protein: 6.2 g/dL (ref 6.0–8.3)

## 2021-03-05 LAB — LIPID PANEL
Cholesterol: 159 mg/dL (ref 0–200)
HDL: 61.1 mg/dL (ref >39.00)
LDL Cholesterol: 80 mg/dL (ref 0–99)
NonHDL: 98.07
Total CHOL/HDL Ratio: 3
Triglycerides: 92 mg/dL (ref 0.0–149.0)
VLDL: 18.4 mg/dL (ref 0.0–40.0)

## 2021-03-05 LAB — TSH: TSH: 1.74 u[IU]/mL (ref 0.35–5.50)

## 2021-03-05 LAB — HEMOGLOBIN A1C: Hgb A1c MFr Bld: 5.6 % (ref 4.6–6.5)

## 2021-03-11 ENCOUNTER — Other Ambulatory Visit: Payer: Self-pay

## 2021-03-12 ENCOUNTER — Ambulatory Visit: Payer: Medicare Other | Admitting: Family Medicine

## 2021-03-12 ENCOUNTER — Encounter: Payer: Self-pay | Admitting: Family Medicine

## 2021-03-12 VITALS — BP 130/78 | HR 58 | Temp 98.0°F | Ht 64.0 in | Wt 126.6 lb

## 2021-03-12 DIAGNOSIS — I2510 Atherosclerotic heart disease of native coronary artery without angina pectoris: Secondary | ICD-10-CM

## 2021-03-12 DIAGNOSIS — I10 Essential (primary) hypertension: Secondary | ICD-10-CM | POA: Diagnosis not present

## 2021-03-12 DIAGNOSIS — E785 Hyperlipidemia, unspecified: Secondary | ICD-10-CM

## 2021-03-12 DIAGNOSIS — E039 Hypothyroidism, unspecified: Secondary | ICD-10-CM | POA: Diagnosis not present

## 2021-03-12 NOTE — Progress Notes (Signed)
Kaitlyn Parks DOB: 1934/08/23 Encounter date: 03/12/2021  This is a 86 y.o. female who presents with Chief Complaint  Patient presents with   Follow-up    History of present illness: Last visit with me was 11/2020. Was having issues with recurrent UTI at that time.   HTN: was worried about easy bruising and felt like bp was being overtreated. She is checking bp at least twice daily at home. This morning was 108 systolic. She is exercising more and has lost weight. Bp for her does improve with exercise. Walking more, doing stairs regularly. She is taking hydralazine 10mg  once daily in evening but doesn't if systolic less than 120 (only takes it 3 times/week). She stopped the amlodipine. Had measured bp 3-4 weeks ago and systolic was 90's and that was when she stopped taking the amlodipine. She took one amlodipine since then (half tab) when pressure was in 155/78 but she hadn't exercised that day.   CAD/HL: taking crestor 5mg  daily.  GERD: not having issues with acid reflux lately; except for when she ate pizza last week. Doesn't usually eat this because it tends to bother her.  Hypothyroid: synthroid daily.   Macular degeneration: still feels like vision is decreasing. She is getting injections every 8 weeks. Right now getting in both eyes.    Allergies  Allergen Reactions   Acyclovir And Related Other (See Comments)    unknown   Pravastatin Sodium Other (See Comments)    cystitis   Sulfa Antibiotics     nausea   Zocor [Simvastatin] Other (See Comments)    cystitis   Current Meds  Medication Sig   aspirin EC 81 MG tablet Take 81 mg by mouth daily.   Biotin w/ Vitamins C & E (HAIR/SKIN/NAILS PO) Take 1 tablet by mouth daily.   Cranberry 400 MG CAPS Take 400 mg by mouth daily.   Fish Oil-Lutein-D-Zeaxanthin (EYE OMEGA ADVANTAGE/VIT D-3) CAPS Take by mouth daily.   hydrALAZINE (APRESOLINE) 10 MG tablet 10 mg twice a day, may take extra dose for SBP >160 up to a total of 4  times a day   isosorbide mononitrate (IMDUR) 60 MG 24 hr tablet TAKE 1 & 1/2 TABLETS A DAY.   Multiple Vitamins-Calcium (ONE-A-DAY WOMENS PO) Take 1 tablet by mouth daily.    Multiple Vitamins-Minerals (PRESERVISION AREDS 2 PO) Take 1 capsule by mouth 2 (two) times daily.   nitroGLYCERIN (NITROSTAT) 0.4 MG SL tablet DISSOLVE 1 TABLET UNDER TONGUE IF NEEDED FOR CHEST PAIN. MAY REPEAT IN 5 MINUTES FOR 3 DOSES.   PRESCRIPTION MEDICATION Avastin eye injections given by Dr due to macular degeneration   rosuvastatin (CRESTOR) 10 MG tablet TAKE (1/2) TABLET DAILY.   SYNTHROID 50 MCG tablet TAKE 1 TABLET EACH DAY.    Review of Systems  Constitutional:  Negative for chills, fatigue and fever.  Respiratory:  Negative for cough, chest tightness, shortness of breath and wheezing.   Cardiovascular:  Negative for chest pain, palpitations and leg swelling.   Objective:  BP 130/78 (BP Location: Left Arm, Patient Position: Sitting, Cuff Size: Normal)   Pulse (!) 58   Temp 98 F (36.7 C) (Oral)   Ht 5\' 4"  (1.626 m)   Wt 126 lb 9.6 oz (57.4 kg)   BMI 21.73 kg/m   Weight: 126 lb 9.6 oz (57.4 kg)   BP Readings from Last 3 Encounters:  03/12/21 130/78  12/12/20 (!) 155/64  11/06/20 120/80   Wt Readings from Last 3 Encounters:  03/12/21 126 lb 9.6 oz (57.4 kg)  11/06/20 127 lb 14.4 oz (58 kg)  10/29/20 127 lb 12.8 oz (58 kg)    Physical Exam Constitutional:      General: She is not in acute distress.    Appearance: She is well-developed.  Cardiovascular:     Rate and Rhythm: Normal rate and regular rhythm.     Heart sounds: Murmur heard.  Systolic murmur is present with a grade of 2/6.    No friction rub.  Pulmonary:     Effort: Pulmonary effort is normal. No respiratory distress.     Breath sounds: Normal breath sounds. No wheezing or rales.  Musculoskeletal:     Right lower leg: No edema.     Left lower leg: No edema.  Neurological:     Mental Status: She is alert and oriented  to person, place, and time.  Psychiatric:        Behavior: Behavior normal.    Assessment/Plan   1. Primary hypertension Well controlled with current medication. She will continue to monitor.   2. Coronary artery disease involving native heart without angina pectoris, unspecified vessel or lesion type Cholesterol has been well controlled; continue with crestor. She has follow up with cardiology.   3. Hypothyroidism (acquired) Continue with current synthroid dose.   4. Dyslipidemia Crestor 5mg  daily.   Return in about 6 months (around 09/12/2021) for physical exam.     11/10/2021, MD

## 2021-04-08 ENCOUNTER — Other Ambulatory Visit: Payer: Self-pay | Admitting: Family Medicine

## 2021-04-08 DIAGNOSIS — I10 Essential (primary) hypertension: Secondary | ICD-10-CM

## 2021-04-15 ENCOUNTER — Encounter (INDEPENDENT_AMBULATORY_CARE_PROVIDER_SITE_OTHER): Payer: Self-pay | Admitting: Ophthalmology

## 2021-04-15 ENCOUNTER — Other Ambulatory Visit: Payer: Self-pay

## 2021-04-15 ENCOUNTER — Ambulatory Visit (INDEPENDENT_AMBULATORY_CARE_PROVIDER_SITE_OTHER): Payer: Medicare Other | Admitting: Ophthalmology

## 2021-04-15 DIAGNOSIS — H353211 Exudative age-related macular degeneration, right eye, with active choroidal neovascularization: Secondary | ICD-10-CM

## 2021-04-15 DIAGNOSIS — I10 Essential (primary) hypertension: Secondary | ICD-10-CM | POA: Diagnosis not present

## 2021-04-15 DIAGNOSIS — H35033 Hypertensive retinopathy, bilateral: Secondary | ICD-10-CM | POA: Diagnosis not present

## 2021-04-15 DIAGNOSIS — Z961 Presence of intraocular lens: Secondary | ICD-10-CM

## 2021-04-15 DIAGNOSIS — H353221 Exudative age-related macular degeneration, left eye, with active choroidal neovascularization: Secondary | ICD-10-CM

## 2021-04-15 DIAGNOSIS — H3581 Retinal edema: Secondary | ICD-10-CM

## 2021-04-15 DIAGNOSIS — H04123 Dry eye syndrome of bilateral lacrimal glands: Secondary | ICD-10-CM

## 2021-04-15 DIAGNOSIS — H353231 Exudative age-related macular degeneration, bilateral, with active choroidal neovascularization: Secondary | ICD-10-CM | POA: Diagnosis not present

## 2021-04-15 MED ORDER — BEVACIZUMAB CHEMO INJECTION 1.25MG/0.05ML SYRINGE FOR KALEIDOSCOPE
1.2500 mg | INTRAVITREAL | Status: AC | PRN
  Administered 2021-04-15: 1.25 mg via INTRAVITREAL

## 2021-04-15 NOTE — Progress Notes (Signed)
[facility] - Clinic Note  04/15/2021     CHIEF COMPLAINT Patient presents for Retina Follow Up   HISTORY OF PRESENT ILLNESS: Kaitlyn Parks is a 86 y.o. female who presents to the clinic today for:   HPI     Retina Follow Up   Patient presents with  Wet AMD.  In left eye.  This started 8 weeks ago.  I, the attending physician,  performed the HPI with the patient and updated documentation appropriately.        Comments   Patient here for 8 weeks retina follow up for exu ARMD OS. Patient states vision is finally better. Has to force OS open with finger at night. Uses drops and gel- sticks together. No eye pain except when uses finger to open eye. Stopped BP med's Hydralazine and amlodipine. Had lost weight and exercises.       Last edited by Rennis Chris, MD on 04/15/2021  4:37 PM.    Pt feels like OS is slightly blurry, she has d/c both BP meds, unless she eats something extra salty, she states her cardiologist is aware   Referring physician: Wynn Banker, MD [address] [place],  [place] [address]  HISTORICAL INFORMATION:   Selected notes from the MEDICAL RECORD NUMBER Referred by Dr. [place] DeMarco for concern of exu ARMD LEE: 11.26.19 (J. 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 (Ophthalmic Drugs)   No current facility-administered medications for this visit. (Ophthalmic Drugs)   Current Outpatient Medications (Other)  Medication Sig   aspirin EC 81 MG tablet Take 81 mg by mouth daily.   Biotin w/ Vitamins C & E (HAIR/SKIN/NAILS PO) Take 1 tablet by mouth daily.   Cranberry 400 MG CAPS Take 400 mg by mouth daily.   Fish Oil-Lutein-D-Zeaxanthin (EYE OMEGA ADVANTAGE/VIT D-3) CAPS Take by mouth daily.   hydrALAZINE (APRESOLINE) 10 MG tablet 10 mg twice a day, may take  extra dose for SBP >160 up to a total of 4 times a day   isosorbide mononitrate (IMDUR) 60 MG 24 hr tablet TAKE 1 & 1/2 TABLETS A DAY.   Multiple Vitamins-Calcium (ONE-A-DAY WOMENS PO) Take 1 tablet by mouth daily.    Multiple Vitamins-Minerals (PRESERVISION AREDS 2 PO) Take 1 capsule by mouth 2 (two) times daily.   nitroGLYCERIN (NITROSTAT) 0.4 MG SL tablet DISSOLVE 1 TABLET UNDER TONGUE IF NEEDED FOR CHEST PAIN. MAY REPEAT IN 5 MINUTES FOR 3 DOSES.   PRESCRIPTION MEDICATION Avastin eye injections given by Dr Vanessa Barbara due to macular degeneration   rosuvastatin (CRESTOR) 10 MG tablet TAKE (1/2) TABLET DAILY.   SYNTHROID 50 MCG tablet TAKE 1 TABLET EACH DAY.   No current facility-administered medications for this visit. (Other)   REVIEW OF SYSTEMS: ROS   Positive for: Gastrointestinal, Neurological, Musculoskeletal, Cardiovascular, Eyes Negative for: Constitutional, Skin, Genitourinary, HENT, Endocrine, Respiratory, Psychiatric, Allergic/Imm, Heme/Lymph Last edited by Laddie Aquas, COA on 04/15/2021  1:17 PM.     ALLERGIES Allergies  Allergen Reactions   Acyclovir And Related Other (See Comments)    unknown   Pravastatin Sodium Other (See Comments)    cystitis   Sulfa Antibiotics     nausea   Zocor [Simvastatin] Other (See Comments)    cystitis    PAST MEDICAL HISTORY Past Medical History:  Diagnosis Date   Arthritis  DDD, scoliosis, sees Dr. Lovell Sheehan for this, uses norco very rarely for pain   Bradycardia 11/11/2017   CAD (coronary artery disease)    LAD stenting of a 90% lesion 2012   Cystocele    Educated about COVID-19 virus infection 12/06/2019   Elevated cholesterol    GERD (gastroesophageal reflux disease)    dx in work up 2016 for atypical CP at OSH   pt. denies   Heart murmur    Hypertensive retinopathy    OU   Hypothyroidism    Interstitial cystitis    sees Dr. [name]   Macular degeneration    OU   NSTEMI (non-ST elevated myocardial infarction) [facility])     Osteoporosis    Rectocele    S/P hip replacement, right 06/12/2017   Scoliosis    Thyroid disease    Hypothyroid   Urinary incontinence    USI   Uterine prolapse    Past Surgical History:  Procedure Laterality Date   BLADDER SUSPENSION  2011   CATARACT EXTRACTION Bilateral 2015   Dr. Elmer Picker   CORONARY ANGIOPLASTY WITH STENT PLACEMENT  04/21/2010   LAD 80%, RCA 30%, nl EF, s/p DES LAD   EYE SURGERY     LEFT HEART CATH AND CORONARY ANGIOGRAPHY N/A 06/14/2017   Procedure: LEFT HEART CATH AND CORONARY ANGIOGRAPHY;  Surgeon: Runell Gess, MD;  [facility]: [facility];  Service: Cardiovascular;  Laterality: N/A;   OOPHORECTOMY  2011   BSO   TOTAL HIP ARTHROPLASTY Right 06/10/2017   Procedure: RIGHT TOTAL HIP ARTHROPLASTY ANTERIOR APPROACH;  Surgeon: Samson Frederic, MD;  Location: [facility];  Service: Orthopedics;  Laterality: Right;  Needs RNFA   VAGINAL HYSTERECTOMY  2011   LAVH BSO; benign    FAMILY HISTORY Family History  Problem Relation Age of Onset   Hypertension Mother    Heart disease Mother    Heart disease Father    Heart disease Sister    Colon cancer Paternal Aunt 29   Breast cancer Paternal Aunt        Age 49's   Diabetes Sister    Endometrial cancer Sister    Breast cancer Cousin        Maternal 1st cousins-Age 50's   Leukemia Paternal Aunt     SOCIAL HISTORY Social History   Tobacco Use   Smoking status: Never   Smokeless tobacco: Never  Vaping Use   Vaping Use: Never used  Substance Use Topics   Alcohol use: No    Comment: Rare   Drug use: No         OPHTHALMIC EXAM:  Base Eye Exam     Visual Acuity (Snellen - Linear)       Right Left   Dist [place] 20/40 +2 20/30 -2   Dist ph [place] 20/30 +2 NI         Tonometry (Tonopen, 1:12 PM)       Right Left   Pressure 13 16         Pupils       Dark Light Shape React APD   Right 3 2 Round Brisk None   Left 3 2 Round Brisk None         Visual Fields (Counting fingers)        Left Right    Full Full         Extraocular Movement       Right Left    Full, Ortho Full, Ortho  Neuro/Psych     Oriented x3: Yes   Mood/Affect: Normal         Dilation     Both eyes: 1.0% Mydriacyl, 2.5% Phenylephrine @ 1:12 PM           Slit Lamp and Fundus Exam     Slit Lamp Exam       Right Left   Lids/Lashes mild Telangiectasia, marginal leasion nasal UL Telangiectasia, Meibomian gland dysfunction, lower lid edema   Conjunctiva/Sclera White and quiet White and quiet, inferior conj chalasis   Cornea Arcus, 2+ Punctate epithelial erosions 3+ inferior punctate epithelial erosions, Arcus   Anterior Chamber Deep and clear Deep and clear   Iris Round and dilated Round and well dilated   Lens PC IOL in good position, trace Posterior capsular opacification (linear, extending just to visual axis) PC IOL in excellent position   Vitreous Vitreous syneresis, Posterior vitreous detachment Vitreous syneresis, Posterior vitreous detachment, vitreous condensations         Fundus Exam       Right Left   Disc Pink and Sharp, Compact Pink and Sharp, mild temporal PPA   C/D Ratio 0.2 0.3   Macula Blunted foveal reflex, Drusen, RPE mottling and clumping, early Atrophy, +PEDs -- stably improved, stable improvement in central cyst / IRF, No heme Blunted foveal reflex, +drusen, pigment clumping, RPE mottling, clumping and early atrophy, no heme, central PED / CNV with overlying cystic changes -- recurrent   Vessels Vascular attenuation Vascular attenuation   Periphery Attached, scattered reticular degeneration   Attached, scattered reticular degeneration            IMAGING AND PROCEDURES  Imaging and Procedures for @TODAY @  OCT, Retina - OU - Both Eyes       Right Eye Quality was good. Central Foveal Thickness: 237. Progression has been stable. Findings include retinal drusen , pigment epithelial detachment, outer retinal atrophy, intraretinal  hyper-reflective material, no SRF, no IRF, abnormal foveal contour (Stable improvement in IRF/cystic changes).   Left Eye Quality was good. Central Foveal Thickness: 276. Progression has worsened. Findings include retinal drusen , outer retinal atrophy, pigment epithelial detachment, intraretinal hyper-reflective material, normal foveal contour, no SRF, intraretinal fluid (Interval increase in IRF overlying PED).   Notes *Images captured and stored on drive  Diagnosis / Impression:  OD: exudative ARMD -- Stable improvement in IRF/cystic changes OS: exu-ARMD -- Interval increase in IRF overlying PED  Clinical management:  See below  Abbreviations: NFP - Normal foveal profile. CME - cystoid macular edema. PED - pigment epithelial detachment. IRF - intraretinal fluid. SRF - subretinal fluid. EZ - ellipsoid zone. ERM - epiretinal membrane. ORA - outer retinal atrophy. ORT - outer retinal tubulation. SRHM - subretinal hyper-reflective material      Intravitreal Injection, Pharmacologic Agent - OS - Left Eye       Time Out 04/15/2021. 1:38 PM. Confirmed correct patient, procedure, site, and patient consented.   Anesthesia Topical anesthesia was used. Anesthetic medications included Lidocaine 2%, Proparacaine 0.5%.   Procedure Preparation included 5% betadine to ocular surface, eyelid speculum. A (32g) needle was used.   Injection: 1.25 mg Bevacizumab 1.25mg /0.78ml   Route: Intravitreal, Site: Left Eye   NDC: P3213405, Lot: [id number], Expiration date: 05/25/2021, Waste: 0.05 mL   Post-op Post injection exam found visual acuity of at least counting fingers. The patient tolerated the procedure well. There were no complications. The patient received written and verbal post procedure care education. Post injection medications were  not given.      Intravitreal Injection, Pharmacologic Agent - OD - Right Eye       Time Out 04/15/2021. 1:41 PM. Confirmed correct patient, procedure,  site, and patient consented.   Anesthesia Topical anesthesia was used. Anesthetic medications included Lidocaine 2%, Proparacaine 0.5%.   Procedure Preparation included 5% betadine to ocular surface, eyelid speculum. A supplied needle was used.   Injection: 1.25 mg Bevacizumab 1.25mg /0.9ml   Route: Intravitreal, Site: Right Eye   NDC: 50242-060-01, Lot: [id number] , Expiration date: 04/30/2021, Waste: 0 mL   Post-op Post injection exam found visual acuity of at least counting fingers. The patient tolerated the procedure well. There were no complications. The patient received written and verbal post procedure care education. Post injection medications were not given.            ASSESSMENT/PLAN:    ICD-10-CM   1. Exudative age-related macular degeneration of left eye with active choroidal neovascularization (HCC)  H35.3221 Intravitreal Injection, Pharmacologic Agent - OS - Left Eye    Bevacizumab (AVASTIN) SOLN 1.25 mg    2. Exudative age-related macular degeneration of right eye with active choroidal neovascularization (HCC)  H35.3211 Intravitreal Injection, Pharmacologic Agent - OD - Right Eye    Bevacizumab (AVASTIN) SOLN 1.25 mg    3. Retinal edema  H35.81 OCT, Retina - OU - Both Eyes    4. Essential hypertension  I10     5. Hypertensive retinopathy of both eyes  H35.033     6. Pseudophakia of both eyes  Z96.1     7. Dry eyes  H04.123      1,2. Exudative age-related macular degeneration, left eye.    - OCT 4.30.2020 showed interval conversion of OS from nonexudative ARMD to exu-ARMD with large dome-shaped PED with overlying IRF/SRF  - FA 05.28.20 -- no active CNV OS, just staining  - s/p IVA OS #1 (04.30.20), #2 (05.28.20), #3 (06.26.20), #4 (08.03.20), #5 (09.11.20), #6 (10.19.20), #7 (11.23.20),  - reactivation of CNV noted on 05.05.22 -- s/p IVA OS  #8 (05.05.22), #9 (06.06.22), #10 (07.19.22)  - today, OCT shows Interval increase in IRF overlying PED at 8 wks  -  BCVA stable at 20/30 today  - recommend IVA OS #11 today, 09.13.22  w/ decrease in interval to 6 wks  - pt wishes to proceed with injection  - RBA of procedure discussed, questions answered  - informed consent obtained and signed  - see procedure note  - benefits investigation for Eylea initiated 4.30.2020 -- approved for 2022  - f/u in 6 wks -- DFE/OCT, possible injection  3. Age related macular degeneration, exudative, OD  - interval development of IRF first noted on 09.11.20 -- conversion from nonexudative to exudative ARMD  - pt initially presented on 11.27.19 due to alert from [facility] home monitoring system for OD  - [facility] prescribed by Dr. Elmer Picker  - FA (5.28.2020) showed staining / window defect corresponding temporal RPE changes OU -- no active CNV OU  - S/P IVA OD #1 (09.11.20), #2 (10.19.20), #3 (11.23.20), #4 (01.07.21), #5 (2.19.21), #6 (04.02.21), #7 (05.28.21), #8 (07.23.21), #9 (09.24.21), #10 (11.29.21), #11 (02.11.22), #12 (05.05.22), #13 (7.19.22)  - benefits investigation for Eylea initiated 4.30.2020 -- approved for 2021  - OCT shows Stable improvement in IRF/cystic changes  - BCVA improved to 20/30 from 20/40  - recommend IVA OD #14 today, 09.13.22             - Risks and benefits of tx discussed  w/pt.             - Pt desires to proceed w/tx today  - f/u 6 weeks as above -- DFE/OCT; may be able to hold injection OD to 12 wks  4,5. Hypertensive retinopathy OU  - discussed importance of tight BP control  - monitor  6. Pseudophakia OU  - s/p CE/IOL OU  - beautiful surgeries, doing well  - monitor  7. Dry eyes OU - recommend artificial tears and lubricating ointment as needed  Ophthalmic Meds Ordered this visit:  Meds ordered this encounter  Medications   Bevacizumab (AVASTIN) SOLN 1.25 mg   Bevacizumab (AVASTIN) SOLN 1.25 mg      Return in about 6 weeks (around 05/27/2021) for f/u exu ARMD OU, DFE, OCT.  There are no Patient Instructions on file for  this visit.  This document serves as a record of services personally performed by Karie Chimera, MD, PhD. It was created on their behalf by Cristopher Estimable, COT an ophthalmic technician. The creation of this record is the provider's dictation and/or activities during the visit.    Electronically signed by: Cristopher Estimable, COT 9.13.22 @ 4:47 PM   This document serves as a record of services personally performed by Karie Chimera, MD, PhD. It was created on their behalf by Glee Arvin. Manson Passey, OA an ophthalmic technician. The creation of this record is the provider's dictation and/or activities during the visit.    Electronically signed by: Glee Arvin. Kristopher Oppenheim 09.13.2022 4:47 PM   Karie Chimera, M.D., Ph.D. Diseases & Surgery of the Retina and Vitreous [facility]  I have reviewed the above documentation for accuracy and completeness, and I agree with the above. Karie Chimera, M.D., Ph.D. 04/15/21 4:53 PM   Abbreviations: M myopia (nearsighted); A astigmatism; H hyperopia (farsighted); P presbyopia; Mrx spectacle prescription;  CTL contact lenses; OD right eye; OS left eye; OU both eyes  XT exotropia; ET esotropia; PEK punctate epithelial keratitis; PEE punctate epithelial erosions; DES dry eye syndrome; MGD meibomian gland dysfunction; ATs artificial tears; PFAT's preservative free artificial tears; NSC nuclear sclerotic cataract; PSC posterior subcapsular cataract; ERM epi-retinal membrane; PVD posterior vitreous detachment; RD retinal detachment; DM diabetes mellitus; DR diabetic retinopathy; NPDR non-proliferative diabetic retinopathy; PDR proliferative diabetic retinopathy; CSME clinically significant macular edema; DME diabetic macular edema; dbh dot blot hemorrhages; CWS cotton wool spot; POAG primary open angle glaucoma; C/D cup-to-disc ratio; HVF humphrey visual field; GVF goldmann visual field; OCT optical coherence tomography; IOP intraocular pressure; BRVO Branch  retinal vein occlusion; CRVO central retinal vein occlusion; CRAO central retinal artery occlusion; BRAO branch retinal artery occlusion; RT retinal tear; SB scleral buckle; PPV pars plana vitrectomy; VH Vitreous hemorrhage; PRP panretinal laser photocoagulation; IVK intravitreal kenalog; VMT vitreomacular traction; MH Macular hole;  NVD neovascularization of the disc; NVE neovascularization elsewhere; AREDS age related eye disease study; ARMD age related macular degeneration; POAG primary open angle glaucoma; EBMD epithelial/anterior basement membrane dystrophy; ACIOL anterior chamber intraocular lens; IOL intraocular lens; PCIOL posterior chamber intraocular lens; Phaco/IOL phacoemulsification with intraocular lens placement; PRK photorefractive keratectomy; LASIK laser assisted in situ keratomileusis; HTN hypertension; DM diabetes mellitus; COPD chronic obstructive pulmonary disease

## 2021-04-23 NOTE — Progress Notes (Signed)
Cardiology Office Note   Date:  04/24/2021   ID:  Kaitlyn Parks [birth year], MRN [id number]  PCP:  Wynn Banker, MD  Cardiologist:   Rollene Rotunda, MD   Chief Complaint  Patient presents with   Back Pain       History of Present Illness: Kaitlyn Parks is a 86 y.o. female who presents for followup up of CAD.  She had a PCI of LAD in 2012. She recently underwent total right hip arthroplasty. She presented to the hospital on 06/12/2017 with sudden onset of right-sided neck pain with radiation to the right jaw. CTA of the chest was negative for PE however troponin was elevated at 0.42. Echocardiogram was obtained on 06/13/2017 showed EF 60-65%, mild LVH, grade 1 DD, mild MR, mild TR, peak PA pressure 30 mmHg. Cardiac catheterization performed on 06/14/2017 showed 95% ostial D1 lesion, widely patent ostial LAD stent, 30% proximal LAD stenosis. Medical therapy was recommended. Imdur 60 mg daily was added to her medical regimen. Beta blocker was initially increased and later reduced due to symptomatic bradycardia. At a previous visit I stopped the beta blocker.   She had chest pain recently and had a negative perfusion study.    Since I last saw her she has been limited by back pain.  She had had some injections.  It causes pain down her left leg.  She is able to do water aerobics.  The patient denies any new symptoms such as chest discomfort, neck or arm discomfort. There has been no new shortness of breath, PND or orthopnea. There have been no reported palpitations, presyncope or syncope.    Past Medical History:  Diagnosis Date   Arthritis    DDD, scoliosis, sees Dr. Lovell Sheehan for this, uses norco very rarely for pain   Bradycardia 11/11/2017   CAD (coronary artery disease)    LAD stenting of a 90% lesion 2012   Cystocele    Educated about COVID-19 virus infection 12/06/2019   Elevated cholesterol    GERD (gastroesophageal reflux disease)    dx in work up 2016 for atypical  CP at OSH   pt. denies   Heart murmur    Hypertensive retinopathy    OU   Hypothyroidism    Interstitial cystitis    sees Dr. [name]   Macular degeneration    OU   NSTEMI (non-ST elevated myocardial infarction) [facility])    Osteoporosis    Rectocele    S/P hip replacement, right 06/12/2017   Scoliosis    Thyroid disease    Hypothyroid   Urinary incontinence    USI   Uterine prolapse     Past Surgical History:  Procedure Laterality Date   BLADDER SUSPENSION  2011   CATARACT EXTRACTION Bilateral 2015   Dr. Elmer Picker   CORONARY ANGIOPLASTY WITH STENT PLACEMENT  04/21/2010   LAD 80%, RCA 30%, nl EF, s/p DES LAD   EYE SURGERY     LEFT HEART CATH AND CORONARY ANGIOGRAPHY N/A 06/14/2017   Procedure: LEFT HEART CATH AND CORONARY ANGIOGRAPHY;  Surgeon: Runell Gess, MD;  Location: [facility];  Service: Cardiovascular;  Laterality: N/A;   OOPHORECTOMY  2011   BSO   TOTAL HIP ARTHROPLASTY Right 06/10/2017   Procedure: RIGHT TOTAL HIP ARTHROPLASTY ANTERIOR APPROACH;  Surgeon: Samson Frederic, MD;  Location: [facility];  Service: Orthopedics;  Laterality: Right;  Needs RNFA   VAGINAL HYSTERECTOMY  2011   LAVH BSO; benign  Current Outpatient Medications  Medication Sig Dispense Refill   aspirin EC 81 MG tablet Take 81 mg by mouth daily.     Biotin w/ Vitamins C & E (HAIR/SKIN/NAILS PO) Take 1 tablet by mouth daily.     Cranberry 400 MG CAPS Take 400 mg by mouth daily.     Fish Oil-Lutein-D-Zeaxanthin (EYE OMEGA ADVANTAGE/VIT D-3) CAPS Take by mouth daily.     isosorbide mononitrate (IMDUR) 60 MG 24 hr tablet TAKE 1 & 1/2 TABLETS A DAY. 135 tablet 1   Multiple Vitamins-Calcium (ONE-A-DAY WOMENS PO) Take 1 tablet by mouth daily.      Multiple Vitamins-Minerals (PRESERVISION AREDS 2 PO) Take 1 capsule by mouth 2 (two) times daily.     nitroGLYCERIN (NITROSTAT) 0.4 MG SL tablet DISSOLVE 1 TABLET UNDER TONGUE IF NEEDED FOR CHEST PAIN. MAY REPEAT IN 5 MINUTES FOR 3 DOSES. 25 tablet 0    PRESCRIPTION MEDICATION Avastin eye injections given by Dr Vanessa Barbara due to macular degeneration     rosuvastatin (CRESTOR) 10 MG tablet TAKE (1/2) TABLET DAILY. 45 tablet 8   SYNTHROID 50 MCG tablet TAKE 1 TABLET EACH DAY. 90 tablet 1   hydrALAZINE (APRESOLINE) 10 MG tablet 10 mg twice a day, may take extra dose for SBP >160 up to a total of 4 times a day (Patient not taking: Reported on 04/24/2021) 180 tablet 3   No current facility-administered medications for this visit.    Allergies:   Acyclovir and related, Pravastatin sodium, Sulfa antibiotics, and Zocor [simvastatin]     ROS:  Please see the history of present illness.   Otherwise, review of systems are positive for none.   All other systems are reviewed and negative.    PHYSICAL EXAM: VS:  BP 128/66 (BP Location: Left Arm, Patient Position: Sitting, Cuff Size: Normal)   Pulse (!) 47   Ht 5\' 4"  (1.626 m)   Wt 123 lb (55.8 kg)   BMI 21.11 kg/m  , BMI Body mass index is 21.11 kg/m. GENERAL:  Well appearing NECK:  No jugular venous distention, waveform within normal limits, carotid upstroke brisk and symmetric, no bruits, no thyromegaly LUNGS:  Clear to auscultation bilaterally CHEST:  Unremarkable HEART:  PMI not displaced or sustained,S1 and S2 within normal limits, no S3, no S4, no clicks, no rubs, no murmurs ABD:  Flat, positive bowel sounds normal in frequency in pitch, no bruits, no rebound, no guarding, no midline pulsatile mass, no hepatomegaly, no splenomegaly EXT:  2 plus pulses throughout, no edema, no cyanosis no clubbing   EKG:  EKG is  ordered today. Sinus bradycardia, rate 47, sinus arrhythmia, axis within normal limits, intervals within normal limits, possible left atrial enlargement.  Recent Labs: 03/05/2021: ALT 13; BUN 18; Creatinine, Ser 0.77; Hemoglobin 13.4; Platelets 247.0; Potassium 4.1; Sodium 141; TSH 1.74    Lipid Panel    Component Value Date/Time   CHOL 159 03/05/2021 0811   CHOL 160  02/05/2017 0834   TRIG 92.0 03/05/2021 0811   HDL 61.10 03/05/2021 0811   HDL 52 02/05/2017 0834   CHOLHDL 3 03/05/2021 0811   VLDL 18.4 03/05/2021 0811   LDLCALC 80 03/05/2021 0811   LDLCALC 72 03/04/2020 0828   LDLDIRECT 140.6 05/25/2012 0855      Wt Readings from Last 3 Encounters:  04/24/21 123 lb (55.8 kg)  03/12/21 126 lb 9.6 oz (57.4 kg)  11/06/20 127 lb 14.4 oz (58 kg)      Other studies Reviewed: Additional studies/  records that were reviewed today include: Labs Review of the above records demonstrates:    See elsewhere   ASSESSMENT AND PLAN:    CAD, NATIVE VESSEL -  The patient has no new sypmtoms.  No further cardiovascular testing is indicated.  We will continue with aggressive risk reduction and meds as listed.  HYPERTENSION -  She actually took her self off the amlodipine.  She has had to use the hydralazine rarely.  I have reviewed her blood pressures from her machine and they fluctuate.  She is going to keep a 2-week 3 times a day blood pressure diary to see if I need to reinitiate low-dose amlodipine.  She can continue to use the hydralazine as needed.   DYSLIPIDEMIA: Her LDL is very slightly elevated at 88 but her HDL 61.  I will continue current therapy.   Current medicines are reviewed at length with the patient today.  The patient does not have concerns regarding medicines.  The following changes have been made:   None  Labs/ tests ordered today include:   None  Orders Placed This Encounter  Procedures   EKG 12-Lead      Disposition:   FU with me in 12 months   Signed, Rollene Rotunda, MD  04/24/2021 11:11 AM    [facility]

## 2021-04-24 ENCOUNTER — Ambulatory Visit: Payer: Medicare Other | Admitting: Cardiology

## 2021-04-24 ENCOUNTER — Other Ambulatory Visit: Payer: Self-pay

## 2021-04-24 ENCOUNTER — Encounter: Payer: Self-pay | Admitting: Cardiology

## 2021-04-24 VITALS — BP 128/66 | HR 47 | Ht 64.0 in | Wt 123.0 lb

## 2021-04-24 DIAGNOSIS — I2510 Atherosclerotic heart disease of native coronary artery without angina pectoris: Secondary | ICD-10-CM

## 2021-04-24 DIAGNOSIS — I10 Essential (primary) hypertension: Secondary | ICD-10-CM | POA: Diagnosis not present

## 2021-04-24 NOTE — Patient Instructions (Signed)
Medication Instructions:  Your physician recommends that you continue on your current medications as directed. Please refer to the Current Medication list given to you today.  *If you need a refill on your cardiac medications before your next appointment, please call your pharmacy*   Follow-Up: At [facility], you and your health needs are our priority.  As part of our continuing mission to provide you with exceptional heart care, we have created designated Provider Care Teams.  These Care Teams include your primary Cardiologist (physician) and Advanced Practice Providers (APPs -  Physician Assistants and Nurse Practitioners) who all work together to provide you with the care you need, when you need it.  We recommend signing up for the patient portal called "MyChart".  Sign up information is provided on this After Visit Summary.  MyChart is used to connect with patients for Virtual Visits (Telemedicine).  Patients are able to view lab/test results, encounter notes, upcoming appointments, etc.  Non-urgent messages can be sent to your provider as well.   To learn more about what you can do with MyChart, go to [email or web address].    Your next appointment:   12 month(s)  The format for your next appointment:   In Person  Provider:   Rollene Rotunda, MD  If you need a sooner appointment please call our office to schedule with Dr. Antoine Poche or Azalee Course PA    Other Instructions Please check your BP at home three times daily for 2 weeks and then submit log to our office for Dr. Antoine Poche to review  HOW TO TAKE YOUR BLOOD PRESSURE: Rest 5 minutes before taking your blood pressure. Don't smoke or drink caffeinated beverages for at least 30 minutes before. Take your blood pressure before (not after) you eat. Sit comfortably with your back supported and both feet on the floor (don't cross your legs). Elevate your arm to heart level on a table or a desk. Use the proper sized cuff. It should  fit smoothly and snugly around your bare upper arm. There should be enough room to slip a fingertip under the cuff. The bottom edge of the cuff should be 1 inch above the crease of the elbow. Ideally, take 3 measurements at one sitting and record the average.

## 2021-04-30 ENCOUNTER — Other Ambulatory Visit: Payer: Self-pay | Admitting: Family Medicine

## 2021-04-30 DIAGNOSIS — E039 Hypothyroidism, unspecified: Secondary | ICD-10-CM

## 2021-05-15 ENCOUNTER — Telehealth: Payer: Self-pay | Admitting: Cardiology

## 2021-05-15 NOTE — Telephone Encounter (Signed)
Returned call to patient who states she spoke with someone from our office and has photocopied the BP readings from 14 days and put them in the mail to our office. She reports average systolic BP 133-143 mmHg. She continues to take hydralazine 10 mg if systolic BP is 160 (or high 150's). I advised her that per Dr. Jenene Slicker note from office visit on 9/22 to continue that practice and that he will review BP readings and advise whether she should restart low dose amlodipine. She verbalized understanding and agreement and thanked me for the call.

## 2021-05-15 NOTE — Telephone Encounter (Signed)
Patient called in to say that there top number for her bp has average between 164-122 over the last two weeks. Please advise

## 2021-05-21 NOTE — Progress Notes (Signed)
[facility] - Clinic Note  05/27/2021     CHIEF COMPLAINT Patient presents for Retina Follow Up   HISTORY OF PRESENT ILLNESS: Kaitlyn Parks is a 86 y.o. female who presents to the clinic today for:   HPI     Retina Follow Up   Patient presents with  Wet AMD.  In both eyes.  This started years ago.  Severity is moderate.  Duration of 6 weeks.  Since onset it is stable.  I, the attending physician,  performed the HPI with the patient and updated documentation appropriately.        Comments   86 y/o female pt here for 6 wk f/u for exu ARMD OU.  VA OU seems to be slightly deteriorating, but could be due to increased issues w/dryness.  Dry eyes bother pt all the time, especially at night.  Uses Systane prn OU, Systane gel QHS OU and Blink gtts prn OU.  Denies pain, FOL, floaters.      Last edited by Rennis Chris, MD on 05/28/2021  9:56 PM.     Referring physician: Wynn Banker, MD [address] [place],  [place] [address]  HISTORICAL INFORMATION:  Selected notes from the MEDICAL RECORD NUMBER Referred by Dr. [place] DeMarco for concern of exu ARMD LEE: 11.26.19 (J. 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 (Ophthalmic Drugs)   No current facility-administered medications for this visit. (Ophthalmic Drugs)   Current Outpatient Medications (Other)  Medication Sig   aspirin EC 81 MG tablet Take 81 mg by mouth daily.   Biotin w/ Vitamins C & E (HAIR/SKIN/NAILS PO) Take 1 tablet by mouth daily.   Cranberry 400 MG CAPS Take 400 mg by mouth daily.   Fish Oil-Lutein-D-Zeaxanthin (EYE OMEGA ADVANTAGE/VIT D-3) CAPS Take by mouth daily.   hydrALAZINE (APRESOLINE) 10 MG tablet 10 mg twice a day, may take extra dose for SBP >160 up to a total of 4 times a day   isosorbide mononitrate (IMDUR)  60 MG 24 hr tablet TAKE 1 & 1/2 TABLETS A DAY.   Multiple Vitamins-Calcium (ONE-A-DAY WOMENS PO) Take 1 tablet by mouth daily.    Multiple Vitamins-Minerals (PRESERVISION AREDS 2 PO) Take 1 capsule by mouth 2 (two) times daily.   nitrofurantoin, macrocrystal-monohydrate, (MACROBID) 100 MG capsule Take by mouth.   nitroGLYCERIN (NITROSTAT) 0.4 MG SL tablet DISSOLVE 1 TABLET UNDER TONGUE IF NEEDED FOR CHEST PAIN. MAY REPEAT IN 5 MINUTES FOR 3 DOSES.   PRESCRIPTION MEDICATION Avastin eye injections given by Dr Vanessa Barbara due to macular degeneration   rosuvastatin (CRESTOR) 10 MG tablet TAKE (1/2) TABLET DAILY.   SYNTHROID 50 MCG tablet TAKE 1 TABLET EACH DAY.   No current facility-administered medications for this visit. (Other)   REVIEW OF SYSTEMS: ROS   Positive for: Gastrointestinal, Musculoskeletal, Cardiovascular, Eyes Negative for: Constitutional, Neurological, Skin, Genitourinary, HENT, Endocrine, Respiratory, Psychiatric, Allergic/Imm, Heme/Lymph Last edited by Celine Mans, COA on 05/27/2021  1:05 PM.      ALLERGIES Allergies  Allergen Reactions   Acyclovir And Related Other (See Comments)    unknown   Pravastatin Sodium Other (See Comments)    cystitis   Sulfa Antibiotics     nausea   Zocor [Simvastatin] Other (See Comments)    cystitis    PAST MEDICAL HISTORY Past Medical History:  Diagnosis Date  Arthritis    DDD, scoliosis, sees Dr. Lovell Sheehan for this, uses norco very rarely for pain   Bradycardia 11/11/2017   CAD (coronary artery disease)    LAD stenting of a 90% lesion 2012   Cystocele    Educated about COVID-19 virus infection 12/06/2019   Elevated cholesterol    GERD (gastroesophageal reflux disease)    dx in work up 2016 for atypical CP at OSH   pt. denies   Heart murmur    Hypertensive retinopathy    OU   Hypothyroidism    Interstitial cystitis    sees Dr. [name]   Macular degeneration    OU   NSTEMI (non-ST elevated myocardial infarction) [facility])     Osteoporosis    Rectocele    S/P hip replacement, right 06/12/2017   Scoliosis    Thyroid disease    Hypothyroid   Urinary incontinence    USI   Uterine prolapse    Past Surgical History:  Procedure Laterality Date   BLADDER SUSPENSION  2011   CATARACT EXTRACTION Bilateral 2015   Dr. Elmer Picker   CORONARY ANGIOPLASTY WITH STENT PLACEMENT  04/21/2010   LAD 80%, RCA 30%, nl EF, s/p DES LAD   EYE SURGERY     LEFT HEART CATH AND CORONARY ANGIOGRAPHY N/A 06/14/2017   Procedure: LEFT HEART CATH AND CORONARY ANGIOGRAPHY;  Surgeon: Runell Gess, MD;  Location: [facility] INVASIVE [facility];  Service: Cardiovascular;  Laterality: N/A;   OOPHORECTOMY  2011   BSO   TOTAL HIP ARTHROPLASTY Right 06/10/2017   Procedure: RIGHT TOTAL HIP ARTHROPLASTY ANTERIOR APPROACH;  Surgeon: Samson Frederic, MD;  Location: [facility] ORS;  Service: Orthopedics;  Laterality: Right;  Needs RNFA   VAGINAL HYSTERECTOMY  2011   LAVH BSO; benign    FAMILY HISTORY Family History  Problem Relation Age of Onset   Hypertension Mother    Heart disease Mother    Heart disease Father    Heart disease Sister    Colon cancer Paternal Aunt 30   Breast cancer Paternal Aunt        Age 38's   Diabetes Sister    Endometrial cancer Sister    Breast cancer Cousin        Maternal 1st cousins-Age 44's   Leukemia Paternal Aunt     SOCIAL HISTORY Social History   Tobacco Use   Smoking status: Never   Smokeless tobacco: Never  Vaping Use   Vaping Use: Never used  Substance Use Topics   Alcohol use: No    Comment: Rare   Drug use: No       OPHTHALMIC EXAM: Base Eye Exam     Visual Acuity (Snellen - Linear)       Right Left   Dist [place] 20/40 20/40   Dist ph [place] 20/30 20/25 -2         Tonometry (Tonopen, 1:18 PM)       Right Left   Pressure 14 10         Pupils       Dark Light Shape React APD   Right 3 2 Round Brisk None   Left 3 2 Round Brisk None         Visual Fields (Counting fingers)       Left Right     Full Full         Extraocular Movement       Right Left    Full, Ortho Full, Ortho  Neuro/Psych     Oriented x3: Yes   Mood/Affect: Normal         Dilation     Both eyes: 1.0% Mydriacyl, 2.5% Phenylephrine @ 1:18 PM           Slit Lamp and Fundus Exam     Slit Lamp Exam       Right Left   Lids/Lashes mild Telangiectasia, marginal leasion nasal UL Telangiectasia, Meibomian gland dysfunction, lower lid edema   Conjunctiva/Sclera White and quiet White and quiet, inferior conj chalasis   Cornea Arcus, 2+ Punctate epithelial erosions 3+ inferior punctate epithelial erosions, Arcus   Anterior Chamber Deep and clear Deep and clear   Iris Round and dilated Round and well dilated   Lens PC IOL in good position, trace Posterior capsular opacification (linear, extending just to visual axis) PC IOL in excellent position   Vitreous Vitreous syneresis, Posterior vitreous detachment Vitreous syneresis, Posterior vitreous detachment, vitreous condensations         Fundus Exam       Right Left   Disc Pink and Sharp, Compact, focal PPP Pink and Sharp, mild temporal PPA   C/D Ratio 0.2 0.3   Macula Blunted foveal reflex, Drusen, RPE mottling and clumping, early Atrophy, +PEDs -- stably improved, stable improvement in central cyst / IRF, No heme Blunted foveal reflex, +drusen, pigment clumping, RPE mottling, clumping and early atrophy, no heme, central PED / CNV with overlying cystic changes -- improved   Vessels Vascular attenuation Vascular attenuation   Periphery Attached, scattered reticular degeneration   Attached, scattered reticular degeneration            IMAGING AND PROCEDURES  Imaging and Procedures for @TODAY @  OCT, Retina - OU - Both Eyes       Right Eye Quality was good. Central Foveal Thickness: 239. Progression has been stable. Findings include retinal drusen , pigment epithelial detachment, outer retinal atrophy, intraretinal hyper-reflective  material, no SRF, no IRF, abnormal foveal contour (Stable improvement in IRF/cystic changes, +punctate IRHM).   Left Eye Quality was good. Central Foveal Thickness: 230. Progression has worsened. Findings include retinal drusen , outer retinal atrophy, pigment epithelial detachment, intraretinal hyper-reflective material, normal foveal contour, no SRF, no IRF (Interval improvement in IRF/cystic changes ).   Notes *Images captured and stored on drive  Diagnosis / Impression:  OD: exudative ARMD -- Stable improvement in IRF/cystic changes, +punctate IRHM OS: exu-ARMD -- Interval improvement in IRF/cystic changes   Clinical management:  See below  Abbreviations: NFP - Normal foveal profile. CME - cystoid macular edema. PED - pigment epithelial detachment. IRF - intraretinal fluid. SRF - subretinal fluid. EZ - ellipsoid zone. ERM - epiretinal membrane. ORA - outer retinal atrophy. ORT - outer retinal tubulation. SRHM - subretinal hyper-reflective material      Intravitreal Injection, Pharmacologic Agent - OS - Left Eye       Time Out 05/27/2021. 1:34 PM. Confirmed correct patient, procedure, site, and patient consented.   Anesthesia Topical anesthesia was used. Anesthetic medications included Lidocaine 2%, Proparacaine 0.5%.   Procedure Preparation included 5% betadine to ocular surface, eyelid speculum. A (32g) needle was used.   Injection: 1.25 mg Bevacizumab 1.25mg /0.60ml   Route: Intravitreal, Site: Left Eye   NDC: 0m, Lot: [id number] , Expiration date: 07/16/2021, Waste: 0 mL   Post-op Post injection exam found visual acuity of at least counting fingers. The patient tolerated the procedure well. There were no complications. The patient received written and verbal post procedure  care education. Post injection medications were not given.             ASSESSMENT/PLAN:    ICD-10-CM   1. Exudative age-related macular degeneration of left eye with active choroidal  neovascularization (HCC)  H35.3221 Intravitreal Injection, Pharmacologic Agent - OS - Left Eye    Bevacizumab (AVASTIN) SOLN 1.25 mg    2. Exudative age-related macular degeneration of right eye with active choroidal neovascularization (HCC)  H35.3211     3. Retinal edema  H35.81 OCT, Retina - OU - Both Eyes    4. Essential hypertension  I10     5. Hypertensive retinopathy of both eyes  H35.033     6. Pseudophakia of both eyes  Z96.1     7. Dry eyes  H04.123       1,2. Exudative age-related macular degeneration, left eye.    - OCT 4.30.2020 showed interval conversion of OS from nonexudative ARMD to exu-ARMD with large dome-shaped PED with overlying IRF/SRF  - FA 05.28.20 -- no active CNV OS, just staining  - s/p IVA OS #1 (04.30.20), #2 (05.28.20), #3 (06.26.20), #4 (08.03.20), #5 (09.11.20), #6 (10.19.20), #7 (11.23.20),  - reactivation of CNV noted on 05.05.22 -- s/p IVA OS  #8 (05.05.22), #9 (06.06.22), #10 (07.19.22), #11 (09.13.22)  - **Interval increase in IRF overlying PED at 8 wks on 09.13.22 visit**  - OCT shows interval improvement in IRF/cystic changes at 6 weeks  - BCVA stable at 20/25 today  - recommend IVA OS #12 today, 10.25.22 with extension to 7 weeks  - pt wishes to proceed with injection  - RBA of procedure discussed, questions answered  - informed consent obtained and signed  - see procedure note  - benefits investigation for Eylea initiated 4.30.2020 -- approved for 2022  - f/u in 7 wks -- DFE/OCT, possible injection  3. Age related macular degeneration, exudative, OD  - interval development of IRF first noted on 09.11.20 -- conversion from nonexudative to exudative ARMD  - pt initially presented on 11.27.19 due to alert from [facility] home monitoring system for OD  - [facility] prescribed by Dr. Elmer Picker  - FA (5.28.2020) showed staining / window defect corresponding temporal RPE changes OU -- no active CNV OU  - S/P IVA OD #1 (09.11.20), #2 (10.19.20), #3  (11.23.20), #4 (01.07.21), #5 (2.19.21), #6 (04.02.21), #7 (05.28.21), #8 (07.23.21), #9 (09.24.21), #10 (11.29.21), #11 (02.11.22), #12 (05.05.22), #13 (7.19.22), #14 (09.13.22)  - benefits investigation for Eylea initiated 4.30.2020 -- approved for 2021  - OCT shows Stable improvement in IRF/cystic changes  - BCVA stable at 20/30   - f/u 7 weeks as above -- DFE/OCT; may be able to hold injection OD to 12 wks  4,5. Hypertensive retinopathy OU  - discussed importance of tight BP control  - monitor  6. Pseudophakia OU  - s/p CE/IOL OU  - beautiful surgeries, doing well  - monitor  7. Dry eyes OU - recommend artificial tears and lubricating ointment as needed  Ophthalmic Meds Ordered this visit:  Meds ordered this encounter  Medications   Bevacizumab (AVASTIN) SOLN 1.25 mg     Return in about 7 weeks (around 07/15/2021) for f/u exu ARMD OU, DFE, OCT.  There are no Patient Instructions on file for this visit.  This document serves as a record of services personally performed by Karie Chimera, MD, PhD. It was created on their behalf by Cristopher Estimable, COT an ophthalmic technician. The creation of this record is the  provider's dictation and/or activities during the visit.    Electronically signed by: Cristopher Estimable, COT 10.19.22 @ 9:59 PM   This document serves as a record of services personally performed by Karie Chimera, MD, PhD. It was created on their behalf by Glee Arvin. Manson Passey, OA an ophthalmic technician. The creation of this record is the provider's dictation and/or activities during the visit.    Electronically signed by: Glee Arvin. Manson Passey, [place] 10.25.2022 9:59 PM  Karie Chimera, M.D., Ph.D. Diseases & Surgery of the Retina and Vitreous [facility]  I have reviewed the above documentation for accuracy and completeness, and I agree with the above. Karie Chimera, M.D., Ph.D. 05/28/21 10:01 PM  Abbreviations: M myopia (nearsighted); A astigmatism; H  hyperopia (farsighted); P presbyopia; Mrx spectacle prescription;  CTL contact lenses; OD right eye; OS left eye; OU both eyes  XT exotropia; ET esotropia; PEK punctate epithelial keratitis; PEE punctate epithelial erosions; DES dry eye syndrome; MGD meibomian gland dysfunction; ATs artificial tears; PFAT's preservative free artificial tears; NSC nuclear sclerotic cataract; PSC posterior subcapsular cataract; ERM epi-retinal membrane; PVD posterior vitreous detachment; RD retinal detachment; DM diabetes mellitus; DR diabetic retinopathy; NPDR non-proliferative diabetic retinopathy; PDR proliferative diabetic retinopathy; CSME clinically significant macular edema; DME diabetic macular edema; dbh dot blot hemorrhages; CWS cotton wool spot; POAG primary open angle glaucoma; C/D cup-to-disc ratio; HVF humphrey visual field; GVF goldmann visual field; OCT optical coherence tomography; IOP intraocular pressure; BRVO Branch retinal vein occlusion; CRVO central retinal vein occlusion; CRAO central retinal artery occlusion; BRAO branch retinal artery occlusion; RT retinal tear; SB scleral buckle; PPV pars plana vitrectomy; VH Vitreous hemorrhage; PRP panretinal laser photocoagulation; IVK intravitreal kenalog; VMT vitreomacular traction; MH Macular hole;  NVD neovascularization of the disc; NVE neovascularization elsewhere; AREDS age related eye disease study; ARMD age related macular degeneration; POAG primary open angle glaucoma; EBMD epithelial/anterior basement membrane dystrophy; ACIOL anterior chamber intraocular lens; IOL intraocular lens; PCIOL posterior chamber intraocular lens; Phaco/IOL phacoemulsification with intraocular lens placement; PRK photorefractive keratectomy; LASIK laser assisted in situ keratomileusis; HTN hypertension; DM diabetes mellitus; COPD chronic obstructive pulmonary disease

## 2021-05-23 NOTE — Telephone Encounter (Signed)
Dr Antoine Poche reviewed reading, no changes continue current regimen Advised patient, verbalized understanding

## 2021-05-27 ENCOUNTER — Other Ambulatory Visit: Payer: Self-pay

## 2021-05-27 ENCOUNTER — Ambulatory Visit (INDEPENDENT_AMBULATORY_CARE_PROVIDER_SITE_OTHER): Payer: Medicare Other | Admitting: Ophthalmology

## 2021-05-27 ENCOUNTER — Encounter (INDEPENDENT_AMBULATORY_CARE_PROVIDER_SITE_OTHER): Payer: Self-pay | Admitting: Ophthalmology

## 2021-05-27 DIAGNOSIS — H35033 Hypertensive retinopathy, bilateral: Secondary | ICD-10-CM

## 2021-05-27 DIAGNOSIS — H353211 Exudative age-related macular degeneration, right eye, with active choroidal neovascularization: Secondary | ICD-10-CM

## 2021-05-27 DIAGNOSIS — H353231 Exudative age-related macular degeneration, bilateral, with active choroidal neovascularization: Secondary | ICD-10-CM | POA: Diagnosis not present

## 2021-05-27 DIAGNOSIS — H3581 Retinal edema: Secondary | ICD-10-CM

## 2021-05-27 DIAGNOSIS — Z961 Presence of intraocular lens: Secondary | ICD-10-CM

## 2021-05-27 DIAGNOSIS — H353221 Exudative age-related macular degeneration, left eye, with active choroidal neovascularization: Secondary | ICD-10-CM

## 2021-05-27 DIAGNOSIS — H04123 Dry eye syndrome of bilateral lacrimal glands: Secondary | ICD-10-CM

## 2021-05-27 DIAGNOSIS — I10 Essential (primary) hypertension: Secondary | ICD-10-CM

## 2021-05-28 ENCOUNTER — Encounter (INDEPENDENT_AMBULATORY_CARE_PROVIDER_SITE_OTHER): Payer: Self-pay | Admitting: Ophthalmology

## 2021-05-28 MED ORDER — BEVACIZUMAB CHEMO INJECTION 1.25MG/0.05ML SYRINGE FOR KALEIDOSCOPE
1.2500 mg | INTRAVITREAL | Status: AC | PRN
  Administered 2021-05-27: 1.25 mg via INTRAVITREAL

## 2021-07-09 NOTE — Progress Notes (Signed)
[facility] Note  07/15/2021     CHIEF COMPLAINT Patient presents for Retina Follow Up   HISTORY OF PRESENT ILLNESS: Kaitlyn Parks is a 86 y.o. female who presents to the clinic today for:   HPI     Retina Follow Up   Patient presents with  Wet AMD.  In left eye.  Severity is severe.  Duration of 7 weeks.  Since onset it is stable.  I, the attending physician,  performed the HPI with the patient and updated documentation appropriately.        Comments   Pt here for 7 wk ret f/u for exu ARMD OS. Pt states vision is about the same, no changes.       Last edited by Bernarda Caffey, MD on 07/17/2021  2:00 PM.     Referring physician: Caren Macadam, MD [place],  [place] [id number]  HISTORICAL INFORMATION:  Selected notes from the MEDICAL RECORD NUMBER Referred by Dr. Martinique DeMarco for concern of exu ARMD LEE: 11.26.19 (J. 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 (Ophthalmic Drugs)   No current facility-administered medications for this visit. (Ophthalmic Drugs)   Current Outpatient Medications (Other)  Medication Sig   aspirin EC 81 MG tablet Take 81 mg by mouth daily.   Biotin w/ Vitamins C & E (HAIR/SKIN/NAILS PO) Take 1 tablet by mouth daily.   Cranberry 400 MG CAPS Take 400 mg by mouth daily.   Fish Oil-Lutein-D-Zeaxanthin (EYE OMEGA ADVANTAGE/VIT D-3) CAPS Take by mouth daily.   hydrALAZINE (APRESOLINE) 10 MG tablet 10 mg twice a day, may take extra dose for SBP >160 up to a total of 4 times a day   isosorbide mononitrate (IMDUR) 60 MG 24 hr tablet TAKE 1 & 1/2 TABLETS A DAY.   Multiple Vitamins-Calcium (ONE-A-DAY WOMENS PO) Take 1 tablet by mouth daily.    Multiple Vitamins-Minerals (PRESERVISION AREDS 2 PO) Take 1 capsule by mouth 2 (two) times  daily.   nitrofurantoin, macrocrystal-monohydrate, (MACROBID) 100 MG capsule Take by mouth.   nitroGLYCERIN (NITROSTAT) 0.4 MG SL tablet DISSOLVE 1 TABLET UNDER TONGUE IF NEEDED FOR CHEST PAIN. MAY REPEAT IN 5 MINUTES FOR 3 DOSES.   PRESCRIPTION MEDICATION Avastin eye injections given by Dr Coralyn Pear due to macular degeneration   rosuvastatin (CRESTOR) 10 MG tablet TAKE (1/2) TABLET DAILY.   SYNTHROID 50 MCG tablet TAKE 1 TABLET EACH DAY.   No current facility-administered medications for this visit. (Other)   REVIEW OF SYSTEMS: ROS   Positive for: Gastrointestinal, Musculoskeletal, Cardiovascular, Eyes Negative for: Constitutional, Neurological, Skin, Genitourinary, HENT, Endocrine, Respiratory, Psychiatric, Allergic/Imm, Heme/Lymph Last edited by Kingsley Spittle, COT on 07/15/2021  1:31 PM.     ALLERGIES Allergies  Allergen Reactions   Acyclovir And Related Other (See Comments)    unknown   Pravastatin Sodium Other (See Comments)    cystitis   Sulfa Antibiotics     nausea   Zocor [Simvastatin] Other (See Comments)    cystitis    PAST MEDICAL HISTORY Past Medical History:  Diagnosis Date   Arthritis    DDD, scoliosis, sees Dr. Arnoldo Morale for this, uses norco very rarely for pain   Bradycardia 11/11/2017   CAD (coronary artery disease)    LAD stenting of a 90% lesion 2012   Cystocele    Educated  about COVID-19 virus infection 12/06/2019   Elevated cholesterol    GERD (gastroesophageal reflux disease)    dx in work up 2016 for atypical CP at OSH   pt. denies   Heart murmur    Hypertensive retinopathy    OU   Hypothyroidism    Interstitial cystitis    sees Dr. Jeffie Pollock   Macular degeneration    OU   NSTEMI (non-ST elevated myocardial infarction) [facility])    Osteoporosis    Rectocele    S/P hip replacement, right 06/12/2017   Scoliosis    Thyroid disease    Hypothyroid   Urinary incontinence    USI   Uterine prolapse    Past Surgical History:  Procedure Laterality Date    BLADDER SUSPENSION  2011   CATARACT EXTRACTION Bilateral 2015   Dr. Herbert Deaner   CORONARY ANGIOPLASTY WITH STENT PLACEMENT  04/21/2010   LAD 80%, RCA 30%, nl EF, s/p DES LAD   EYE SURGERY     LEFT HEART CATH AND CORONARY ANGIOGRAPHY N/A 06/14/2017   Procedure: LEFT HEART CATH AND CORONARY ANGIOGRAPHY;  Surgeon: Lorretta Harp, MD;  Location: [facility];  Service: Cardiovascular;  Laterality: N/A;   OOPHORECTOMY  2011   BSO   TOTAL HIP ARTHROPLASTY Right 06/10/2017   Procedure: RIGHT TOTAL HIP ARTHROPLASTY ANTERIOR APPROACH;  Surgeon: Rod Can, MD;  Location: [facility];  Service: Orthopedics;  Laterality: Right;  Needs RNFA   VAGINAL HYSTERECTOMY  2011   LAVH BSO; benign    FAMILY HISTORY Family History  Problem Relation Age of Onset   Hypertension Mother    Heart disease Mother    Heart disease Father    Heart disease Sister    Colon cancer Paternal Aunt 53   Breast cancer Paternal 47        Age 24's   Diabetes Sister    Endometrial cancer Sister    Breast cancer Cousin        Maternal 1st cousins-Age 93's   Leukemia Paternal Aunt    SOCIAL HISTORY Social History   Tobacco Use   Smoking status: Never   Smokeless tobacco: Never  Vaping Use   Vaping Use: Never used  Substance Use Topics   Alcohol use: No    Comment: Rare   Drug use: No       OPHTHALMIC EXAM: Base Eye Exam     Visual Acuity (Snellen - Linear)       Right Left   Dist [place] 20/30 -2 20/30 -1   Dist ph [place] 20/30 NI         Tonometry (Tonopen, 1:41 PM)       Right Left   Pressure 14 15         Pupils       Dark Light Shape React APD   Right 3 2 Round Brisk None   Left 3 2 Round Brisk None         Visual Fields (Counting fingers)       Left Right    Full Full         Extraocular Movement       Right Left    Full, Ortho Full, Ortho         Neuro/Psych     Oriented x3: Yes   Mood/Affect: Normal         Dilation     Both eyes: 1.0% Mydriacyl, 2.5%  Phenylephrine @ 1:41 PM  Slit Lamp and Fundus Exam     Slit Lamp Exam       Right Left   Lids/Lashes mild Telangiectasia, marginal leasion nasal UL Telangiectasia, Meibomian gland dysfunction, lower lid edema   Conjunctiva/Sclera White and quiet White and quiet, inferior conj chalasis   Cornea Arcus, 2+ Punctate epithelial erosions 3+ inferior punctate epithelial erosions, Arcus   Anterior Chamber Deep and clear Deep and clear   Iris Round and dilated Round and well dilated   Lens PC IOL in good position, trace Posterior capsular opacification (linear, extending just to visual axis) PC IOL in excellent position   Anterior Vitreous Vitreous syneresis, Posterior vitreous detachment Vitreous syneresis, Posterior vitreous detachment, vitreous condensations         Fundus Exam       Right Left   Disc Pink and Sharp, Compact, focal PPP Pink and Sharp, mild temporal PPA   C/D Ratio 0.2 0.3   Macula Blunted foveal reflex, Drusen, RPE mottling and clumping, early Atrophy, +PEDs -- stably improved, stable improvement in central cyst / IRF, No heme Blunted foveal reflex, +drusen, pigment clumping, RPE mottling, clumping and early atrophy, no heme, central PED / CNV with overlying cystic changes -- stably improved   Vessels Vascular attenuation Vascular attenuation   Periphery Attached, scattered reticular degeneration   Attached, scattered reticular degeneration            IMAGING AND PROCEDURES  Imaging and Procedures for _0 @  OCT, Retina - OU - Both Eyes       Right Eye Quality was good. Central Foveal Thickness: 241. Progression has been stable. Findings include retinal drusen , pigment epithelial detachment, outer retinal atrophy, intraretinal hyper-reflective material, no SRF, no IRF, abnormal foveal contour (Stable improvement in IRF/cystic changes, +punctate IRHM).   Left Eye Quality was good. Central Foveal Thickness: 235. Progression has been stable.  Findings include retinal drusen , outer retinal atrophy, pigment epithelial detachment, intraretinal hyper-reflective material, normal foveal contour, no SRF, no IRF (stable improvement in IRF/cystic changes ).   Notes *Images captured and stored on drive  Diagnosis / Impression:  OD: exudative ARMD -- Stable improvement in IRF/cystic changes, +punctate IRHM OS: exu-ARMD -- stable improvement in IRF/cystic changes   Clinical management:  See below  Abbreviations: NFP - Normal foveal profile. CME - cystoid macular edema. PED - pigment epithelial detachment. IRF - intraretinal fluid. SRF - subretinal fluid. EZ - ellipsoid zone. ERM - epiretinal membrane. ORA - outer retinal atrophy. ORT - outer retinal tubulation. SRHM - subretinal hyper-reflective material      Intravitreal Injection, Pharmacologic Agent - OS - Left Eye       Time Out 07/15/2021. 1:59 PM. Confirmed correct patient, procedure, site, and patient consented.   Anesthesia Topical anesthesia was used. Anesthetic medications included Lidocaine 2%, Proparacaine 0.5%.   Procedure Preparation included 5% betadine to ocular surface, eyelid speculum. A supplied (32g) needle was used.   Injection: 1.25 mg Bevacizumab 1.53m/0.05ml   Route: Intravitreal, Site: Left Eye   NDC:: 33545-625-63 Lot: [id number] , Expiration date: 09/09/2021, Waste: 0 mL   Post-op Post injection exam found visual acuity of at least counting fingers. The patient tolerated the procedure well. There were no complications. The patient received written and verbal post procedure care education. Post injection medications were not given.            ASSESSMENT/PLAN:    ICD-10-CM   1. Exudative age-related macular degeneration of left eye with active choroidal neovascularization ([place]  H35.3221 OCT, Retina - OU - Both Eyes    Intravitreal Injection, Pharmacologic Agent - OS - Left Eye    Bevacizumab (AVASTIN) SOLN 1.25 mg    2. Exudative age-related  macular degeneration of right eye with active choroidal neovascularization (HCC)  H35.3211 OCT, Retina - OU - Both Eyes    3. Essential hypertension  I10     4. Hypertensive retinopathy of both eyes  H35.033     5. Pseudophakia of both eyes  Z96.1     6. Dry eyes  H04.123      1. Exudative age-related macular degeneration, left eye.    - OCT 4.30.2020 showed interval conversion of OS from nonexudative ARMD to exu-ARMD with large dome-shaped PED with overlying IRF/SRF  - FA 05.28.20 -- no active CNV OS, just staining  - s/p IVA OS #1 (04.30.20), #2 (05.28.20), #3 (06.26.20), #4 (08.03.20), #5 (09.11.20), #6 (10.19.20), #7 (11.23.20),  - reactivation of CNV noted on 05.05.22 -- s/p IVA OS  #8 (05.05.22), #9 (06.06.22), #10 (07.19.22), #11 (09.13.22), #12 (10.25.22)  - **Interval increase in IRF overlying PED at 8 wks on 09.13.22 visit**  - BCVA decreased to 20/30 from 20/25 today  - OCT shows OD: stable improvement in IRF/cystic changes at 7 wks  - recommend IVA OS #13 today, 12.13.22 with f/u at 7 weeks  - pt wishes to proceed with injection  - RBA of procedure discussed, questions answered  - informed consent obtained and signed  - see procedure note  - benefits investigation for Eylea initiated 4.30.2020 -- approved for 2022  - f/u in 7 wks -- DFE/OCT, possible injection  2. Age related macular degeneration, exudative, OD  - interval development of IRF first noted on 09.11.20 -- conversion from nonexudative to exudative ARMD  - pt initially presented on 11.27.19 due to alert from [facility] home monitoring system for OD  - [facility] prescribed by Dr. Herbert Deaner  - FA (5.28.2020) showed staining / window defect corresponding temporal RPE changes OU -- no active CNV OU  - S/P IVA OD #1 (09.11.20), #2 (10.19.20), #3 (11.23.20), #4 (01.07.21), #5 (2.19.21), #6 (04.02.21), #7 (05.28.21), #8 (07.23.21), #9 (09.24.21), #10 (11.29.21), #11 (02.11.22), #12 (05.05.22), #13 (7.19.22), #14 (09.13.22)  -  benefits investigation for Eylea initiated 4.30.2020 -- approved for 2022  - OCT shows OD: Stable improvement in IRF/cystic changes  - BCVA stable at 20/30   - will hold IVA OD again today -- will treat prn  - f/u 7 weeks as above -- DFE/OCT  3,4. Hypertensive retinopathy OU  - discussed importance of tight BP control  - monitor  5. Pseudophakia OU  - s/p CE/IOL OU  - beautiful surgeries, doing well  - monitor  6. Dry eyes OU - recommend artificial tears and lubricating ointment as needed  Ophthalmic Meds Ordered this visit:  Meds ordered this encounter  Medications   Bevacizumab (AVASTIN) SOLN 1.25 mg     Return in about 7 weeks (around 09/02/2021) for f/u exu ARMD OU, DFE, OCT.  There are no Patient Instructions on file for this visit.  This document serves as a record of services personally performed by Gardiner Sleeper, MD, PhD. It was created on their behalf by Estill Bakes, COT an ophthalmic technician. The creation of this record is the provider's dictation and/or activities during the visit.    Electronically signed by: Estill Bakes, COT 12.7.22 @ 2:28 PM   This document serves as a record of services personally performed by  Gardiner Sleeper, MD, PhD. It was created on their behalf by San Jetty. Owens Shark, OA an ophthalmic technician. The creation of this record is the provider's dictation and/or activities during the visit.    Electronically signed by: San Jetty. Owens Shark, [place] 12.13.2022 2:28 PM  Gardiner Sleeper, M.D., Ph.D. Diseases & Surgery of the Retina and Vitreous [facility]  I have reviewed the above documentation for accuracy and completeness, and I agree with the above. Gardiner Sleeper, M.D., Ph.D. 07/17/21 2:28 PM   Abbreviations: M myopia (nearsighted); A astigmatism; H hyperopia (farsighted); P presbyopia; Mrx spectacle prescription;  CTL contact lenses; OD right eye; OS left eye; OU both eyes  XT exotropia; ET esotropia; PEK punctate  epithelial keratitis; PEE punctate epithelial erosions; DES dry eye syndrome; MGD meibomian gland dysfunction; ATs artificial tears; PFAT's preservative free artificial tears; [place] nuclear sclerotic cataract; PSC posterior subcapsular cataract; ERM epi-retinal membrane; PVD posterior vitreous detachment; RD retinal detachment; DM diabetes mellitus; DR diabetic retinopathy; NPDR non-proliferative diabetic retinopathy; PDR proliferative diabetic retinopathy; CSME clinically significant macular edema; DME diabetic macular edema; dbh dot blot hemorrhages; CWS cotton wool spot; POAG primary open angle glaucoma; C/D cup-to-disc ratio; HVF humphrey visual field; GVF goldmann visual field; OCT optical coherence tomography; IOP intraocular pressure; BRVO Branch retinal vein occlusion; CRVO central retinal vein occlusion; CRAO central retinal artery occlusion; BRAO branch retinal artery occlusion; RT retinal tear; SB scleral buckle; PPV pars plana vitrectomy; VH Vitreous hemorrhage; PRP panretinal laser photocoagulation; IVK intravitreal kenalog; VMT vitreomacular traction; MH Macular hole;  NVD neovascularization of the disc; NVE neovascularization elsewhere; AREDS age related eye disease study; ARMD age related macular degeneration; POAG primary open angle glaucoma; EBMD epithelial/anterior basement membrane dystrophy; ACIOL anterior chamber intraocular lens; IOL intraocular lens; PCIOL posterior chamber intraocular lens; Phaco/IOL phacoemulsification with intraocular lens placement; [place] photorefractive keratectomy; LASIK laser assisted in situ keratomileusis; HTN hypertension; DM diabetes mellitus; COPD chronic obstructive pulmonary disease

## 2021-07-14 ENCOUNTER — Encounter (INDEPENDENT_AMBULATORY_CARE_PROVIDER_SITE_OTHER): Payer: Medicare Other | Admitting: Ophthalmology

## 2021-07-15 ENCOUNTER — Ambulatory Visit (INDEPENDENT_AMBULATORY_CARE_PROVIDER_SITE_OTHER): Payer: Medicare Other | Admitting: Ophthalmology

## 2021-07-15 ENCOUNTER — Encounter (INDEPENDENT_AMBULATORY_CARE_PROVIDER_SITE_OTHER): Payer: Self-pay | Admitting: Ophthalmology

## 2021-07-15 ENCOUNTER — Other Ambulatory Visit: Payer: Self-pay

## 2021-07-15 DIAGNOSIS — H3581 Retinal edema: Secondary | ICD-10-CM

## 2021-07-15 DIAGNOSIS — Z961 Presence of intraocular lens: Secondary | ICD-10-CM

## 2021-07-15 DIAGNOSIS — I10 Essential (primary) hypertension: Secondary | ICD-10-CM

## 2021-07-15 DIAGNOSIS — H353231 Exudative age-related macular degeneration, bilateral, with active choroidal neovascularization: Secondary | ICD-10-CM | POA: Diagnosis not present

## 2021-07-15 DIAGNOSIS — H353211 Exudative age-related macular degeneration, right eye, with active choroidal neovascularization: Secondary | ICD-10-CM

## 2021-07-15 DIAGNOSIS — H04123 Dry eye syndrome of bilateral lacrimal glands: Secondary | ICD-10-CM

## 2021-07-15 DIAGNOSIS — H353221 Exudative age-related macular degeneration, left eye, with active choroidal neovascularization: Secondary | ICD-10-CM

## 2021-07-15 DIAGNOSIS — H35033 Hypertensive retinopathy, bilateral: Secondary | ICD-10-CM

## 2021-07-17 ENCOUNTER — Encounter (INDEPENDENT_AMBULATORY_CARE_PROVIDER_SITE_OTHER): Payer: Self-pay | Admitting: Ophthalmology

## 2021-07-17 DIAGNOSIS — H353231 Exudative age-related macular degeneration, bilateral, with active choroidal neovascularization: Secondary | ICD-10-CM | POA: Diagnosis not present

## 2021-07-17 MED ORDER — BEVACIZUMAB CHEMO INJECTION 1.25MG/0.05ML SYRINGE FOR KALEIDOSCOPE
1.2500 mg | INTRAVITREAL | Status: AC | PRN
  Administered 2021-07-17: 1.25 mg via INTRAVITREAL

## 2021-07-22 ENCOUNTER — Other Ambulatory Visit: Payer: Self-pay

## 2021-07-22 ENCOUNTER — Telehealth (INDEPENDENT_AMBULATORY_CARE_PROVIDER_SITE_OTHER): Payer: Medicare Other | Admitting: Family Medicine

## 2021-07-22 ENCOUNTER — Encounter: Payer: Self-pay | Admitting: Family Medicine

## 2021-07-22 VITALS — Temp 97.6°F

## 2021-07-22 DIAGNOSIS — R197 Diarrhea, unspecified: Secondary | ICD-10-CM | POA: Diagnosis not present

## 2021-07-22 DIAGNOSIS — R0981 Nasal congestion: Secondary | ICD-10-CM

## 2021-07-22 DIAGNOSIS — J029 Acute pharyngitis, unspecified: Secondary | ICD-10-CM | POA: Diagnosis not present

## 2021-07-22 NOTE — Progress Notes (Signed)
Virtual Visit via Telephone Note  I connected with Kaitlyn Parks on 07/22/21 at  1:00 PM EST by telephone and verified that I am speaking with the correct person using two identifiers.   I discussed the limitations, risks, security and privacy concerns of performing an evaluation and management service by telephone and the availability of in person appointments. I also discussed with the patient that there may be a patient responsible charge related to this service. The patient expressed understanding and agreed to proceed.  Location patient: home, [place] Location provider: work or home office Participants present for the call: patient, provider Patient did not have a visit with me in the prior 7 days to address this/these issue(s).   History of Present Illness:  Acute telemedicine visit for flu like symptoms: -Onset: 3-4 days ago -Symptoms include: sore throat, some diarrhea a few days ago, some sinus congestion, headache, ear ache a little, taste has been off -had a negative covid test -diarrhea is improving today -Denies: fever, CP, SOB, vomiting, melena, hematochezia, inability to eat/drink, known sick contacts -Has tried: bland diet and imodium, tylenol -Pertinent past medical history: see below, had covid -Pertinent medication allergies:  Allergies  Allergen Reactions   Acyclovir And Related Other (See Comments)    unknown   Pravastatin Sodium Other (See Comments)    cystitis   Sulfa Antibiotics     nausea   Zocor [Simvastatin] Other (See Comments)    cystitis  -COVID-19 vaccine status: Immunization History  Administered Date(s) Administered   Influenza Whole 08/11/2007   Influenza, High Dose Seasonal PF 08/06/2014, 04/30/2017, 06/20/2018, 05/07/2021   Influenza,inj,Quad PF,6+ Mos 04/24/2013   Influenza-Unspecified 06/03/2016, 05/04/2019, 04/03/2020   PFIZER(Purple Top)SARS-COV-2 Vaccination 08/13/2019, 09/02/2019, 11/02/2019   Pfizer Covid-19 Vaccine Bivalent Booster  73yrs & up 05/07/2021   Pneumococcal Conjugate-13 02/19/2014   Pneumococcal Polysaccharide-23 09/13/2007   Td 11/04/1995, 09/13/2007   Tdap 03/04/2018   Zoster Recombinat (Shingrix) 03/04/2018, 05/10/2018     Observations/Objective: Patient sounds cheerful and well on the phone. I do not appreciate any SOB. Speech and thought processing are grossly intact. Patient reported vitals:  Assessment and Plan:  Diarrhea, unspecified type  Sore throat  Sinus congestion  -we discussed possible serious and likely etiologies, options for evaluation and workup, limitations of telemedicine visit vs in person visit, treatment, treatment risks and precautions. Pt prefers to treat via telemedicine empirically rather than in person at this moment. Query viral illness, flu or covid vs other. Diarrhea is improving. She has opted for imodium prn, tylenol, nasal saline, steam, non dairy diet, salt water gargles and other care measures per pt instructions. She agrees to a 48 hour recheck on the covid test and is aware can do follow up virtual visit or contact [place] pharmacy if positive and wishes to do antiviral.  Advised to seek prompt virtual visit follow up or in person care if worsening, new symptoms arise, or if is not improving with treatment. Advised of options for care if pcp not available.   Follow Up Instructions:  I did not refer this patient for an OV with me in the next 24 hours for this/these issue(s).  I discussed the assessment and treatment plan with the patient. The patient was provided an opportunity to ask questions and all were answered. The patient agreed with the plan and demonstrated an understanding of the instructions.   I spent 13 minutes on the date of this visit in the care of this patient. See summary of  tasks completed to properly care for this patient in the detailed notes above which also included counseling of above, review of PMH, medications, allergies, evaluation  of the patient and ordering and/or  instructing patient on testing and care options.     Terressa Koyanagi, DO

## 2021-07-22 NOTE — Patient Instructions (Signed)
°  HOME CARE TIPS:  -COVID19 testing information: GoldAgenda.is  Most pharmacies also offer testing and home test kits. If the Covid19 test is positive and you desire antiviral treatment, please contact a [facility] pharmacy or schedule a follow up virtual visit through your primary care office or through the [employer].  Other test to treat options: http://www.vasquez-vaughn.biz/?click_source=alert  -steam and warm salt water gargles  -can use tylenol to a max dose of 1000mg  2-3 times per day for fevers, aches and pains per instructions  -can use nasal saline a few times per day if you have nasal congestion  -can use imodium if any further diarrhea  -stay hydrated, drink plenty of fluids and eat small healthy meals - avoid dairy  -can take 1000 IU ( ) Vit D3 and 100-500 mg of Vit C daily per instructions  -If the Covid test is positive, check out the [facility] website for more information on home care, transmission and treatment for COVID19  -follow up with your doctor in 2-3 days unless improving and feeling better  -stay home while sick, except to seek medical care. If you have COVID19, you will likely be contagious for 7-10 days. Flu or Influenza is likely contagious for about 7 days. Other respiratory viral infections remain contagious for 5-10+ days depending on the virus and many other factors. Wear a good mask that fits snugly (such as N95 or KN95) if around others to reduce the risk of transmission.  It was nice to meet you today, and I really hope you are feeling better soon. I help [facility] out with telemedicine visits on Tuesdays and Thursdays and am happy to help if you need a follow up virtual visit on those days. Otherwise, if you have any concerns or questions following this visit please schedule a follow up visit with your Primary Care doctor or seek care at a local urgent care clinic to avoid delays in care.    Seek in person  care or schedule a follow up video visit promptly if your symptoms worsen, new concerns arise or you are not improving with treatment. Call 911 and/or seek emergency care if your symptoms are severe or life threatening.

## 2021-07-24 ENCOUNTER — Telehealth: Payer: Self-pay | Admitting: Family Medicine

## 2021-07-24 NOTE — Telephone Encounter (Signed)
Patient had a virtual visit with Dr. Selena Batten on 12/20 and says that she is now needing something to help with nausea. She says that she tested herself again for COVID and it was negative and that her temperature has been 97.2. Patient is now dealing with nausea and wants to know if there is a prescription that can be sent in or if Dr. Hassan Rowan recommends something OTC.  informed patient that Dr. Hassan Rowan is out of the office today but a message could be sent back.  Patient can be reached at either [phone] or [phone].  Please advise.

## 2021-07-24 NOTE — Telephone Encounter (Signed)
Spoke with the patient and informed her a visit is needed due to this being a new problem.  Patient was advised there is an opening with Dr Ardyth Harps at 10am; however she would need to arrive 30 minutes prior as we would need to do our own Covid test in order for her to be seen in the office if this is negative.  Patient declined as she stated she does not want to come in to the office and has tested negative at home.  Appt was scheduled for tomorrow at 10am for a virtual visit with Dr Ardyth Harps at 10am.

## 2021-07-25 ENCOUNTER — Telehealth: Payer: Medicare Other | Admitting: Internal Medicine

## 2021-08-06 ENCOUNTER — Ambulatory Visit (INDEPENDENT_AMBULATORY_CARE_PROVIDER_SITE_OTHER): Payer: Medicare Other

## 2021-08-06 VITALS — BP 118/62 | HR 64 | Temp 97.8°F | Ht 64.0 in | Wt 118.5 lb

## 2021-08-06 DIAGNOSIS — Z0000 Encounter for general adult medical examination without abnormal findings: Secondary | ICD-10-CM

## 2021-08-06 NOTE — Patient Instructions (Signed)
Kaitlyn Parks , Thank you for taking time to come for your Medicare Wellness Visit. I appreciate your ongoing commitment to your health goals. Please review the following plan we discussed and let me know if I can assist you in the future.   These are the goals we discussed:  Goals      Exercise 150 min/wk Moderate Activity     Patient wants to maintain current exercise program.     Exercise 150 minutes per week (moderate activity)     Walk more consistently Will walk 1st thing in the am         This is a list of the screening recommended for you and due dates:  Health Maintenance  Topic Date Due   Mammogram  10/09/2021   Tetanus Vaccine  03/04/2028   Pneumonia Vaccine  Completed   Flu Shot  Completed   DEXA scan (bone density measurement)  Completed   COVID-19 Vaccine  Completed   Zoster (Shingles) Vaccine  Completed   HPV Vaccine  Aged Out   Advanced directives: Yes  Conditions/risks identified: None  Next appointment: Follow up in one year for your annual wellness visit    Preventive Care 65 Years and Older, Female Preventive care refers to lifestyle choices and visits with your health care provider that can promote health and wellness. What does preventive care include? A yearly physical exam. This is also called an annual well check. Dental exams once or twice a year. Routine eye exams. Ask your health care provider how often you should have your eyes checked. Personal lifestyle choices, including: Daily care of your teeth and gums. Regular physical activity. Eating a healthy diet. Avoiding tobacco and drug use. Limiting alcohol use. Practicing safe sex. Taking low-dose aspirin every day. Taking vitamin and mineral supplements as recommended by your health care provider. What happens during an annual well check? The services and screenings done by your health care provider during your annual well check will depend on your age, overall health, lifestyle risk  factors, and family history of disease. Counseling  Your health care provider may ask you questions about your: Alcohol use. Tobacco use. Drug use. Emotional well-being. Home and relationship well-being. Sexual activity. Eating habits. History of falls. Memory and ability to understand (cognition). Work and work Astronomer. Reproductive health. Screening  You may have the following tests or measurements: Height, weight, and BMI. Blood pressure. Lipid and cholesterol levels. These may be checked every 5 years, or more frequently if you are over 27 years old. Skin check. Lung cancer screening. You may have this screening every year starting at age 53 if you have a 30-pack-year history of smoking and currently smoke or have quit within the past 15 years. Fecal occult blood test (FOBT) of the stool. You may have this test every year starting at age 32. Flexible sigmoidoscopy or colonoscopy. You may have a sigmoidoscopy every 5 years or a colonoscopy every 10 years starting at age 20. Hepatitis C blood test. Hepatitis B blood test. Sexually transmitted disease (STD) testing. Diabetes screening. This is done by checking your blood sugar (glucose) after you have not eaten for a while (fasting). You may have this done every 1-3 years. Bone density scan. This is done to screen for osteoporosis. You may have this done starting at age 26. Mammogram. This may be done every 1-2 years. Talk to your health care provider about how often you should have regular mammograms. Talk with your health care provider about your  test results, treatment options, and if necessary, the need for more tests. Vaccines  Your health care provider may recommend certain vaccines, such as: Influenza vaccine. This is recommended every year. Tetanus, diphtheria, and acellular pertussis (Tdap, Td) vaccine. You may need a Td booster every 10 years. Zoster vaccine. You may need this after age 35. Pneumococcal 13-valent  conjugate (PCV13) vaccine. One dose is recommended after age 108. Pneumococcal polysaccharide (PPSV23) vaccine. One dose is recommended after age 49. Talk to your health care provider about which screenings and vaccines you need and how often you need them. This information is not intended to replace advice given to you by your health care provider. Make sure you discuss any questions you have with your health care provider. Document Released: 08/16/2015 Document Revised: 04/08/2016 Document Reviewed: 05/21/2015 Elsevier Interactive Patient Education  2017 [employer].  Fall Prevention in the Home Falls can cause injuries. They can happen to people of all ages. There are many things you can do to make your home safe and to help prevent falls. What can I do on the outside of my home? Regularly fix the edges of walkways and driveways and fix any cracks. Remove anything that might make you trip as you walk through a door, such as a raised step or threshold. Trim any bushes or trees on the path to your home. Use bright outdoor lighting. Clear any walking paths of anything that might make someone trip, such as rocks or tools. Regularly check to see if handrails are loose or broken. Make sure that both sides of any steps have handrails. Any raised decks and porches should have guardrails on the edges. Have any leaves, snow, or ice cleared regularly. Use sand or salt on walking paths during winter. Clean up any spills in your garage right away. This includes oil or grease spills. What can I do in the bathroom? Use night lights. Install grab bars by the toilet and in the tub and shower. Do not use towel bars as grab bars. Use non-skid mats or decals in the tub or shower. If you need to sit down in the shower, use a plastic, non-slip stool. Keep the floor dry. Clean up any water that spills on the floor as soon as it happens. Remove soap buildup in the tub or shower regularly. Attach bath mats  securely with double-sided non-slip rug tape. Do not have throw rugs and other things on the floor that can make you trip. What can I do in the bedroom? Use night lights. Make sure that you have a light by your bed that is easy to reach. Do not use any sheets or blankets that are too big for your bed. They should not hang down onto the floor. Have a firm chair that has side arms. You can use this for support while you get dressed. Do not have throw rugs and other things on the floor that can make you trip. What can I do in the kitchen? Clean up any spills right away. Avoid walking on wet floors. Keep items that you use a lot in easy-to-reach places. If you need to reach something above you, use a strong step stool that has a grab bar. Keep electrical cords out of the way. Do not use floor polish or wax that makes floors slippery. If you must use wax, use non-skid floor wax. Do not have throw rugs and other things on the floor that can make you trip. What can I do with my  stairs? Do not leave any items on the stairs. Make sure that there are handrails on both sides of the stairs and use them. Fix handrails that are broken or loose. Make sure that handrails are as long as the stairways. Check any carpeting to make sure that it is firmly attached to the stairs. Fix any carpet that is loose or worn. Avoid having throw rugs at the top or bottom of the stairs. If you do have throw rugs, attach them to the floor with carpet tape. Make sure that you have a light switch at the top of the stairs and the bottom of the stairs. If you do not have them, ask someone to add them for you. What else can I do to help prevent falls? Wear shoes that: Do not have high heels. Have rubber bottoms. Are comfortable and fit you well. Are closed at the toe. Do not wear sandals. If you use a stepladder: Make sure that it is fully opened. Do not climb a closed stepladder. Make sure that both sides of the stepladder  are locked into place. Ask someone to hold it for you, if possible. Clearly mark and make sure that you can see: Any grab bars or handrails. First and last steps. Where the edge of each step is. Use tools that help you move around (mobility aids) if they are needed. These include: Canes. Walkers. Scooters. Crutches. Turn on the lights when you go into a dark area. Replace any light bulbs as soon as they burn out. Set up your furniture so you have a clear path. Avoid moving your furniture around. If any of your floors are uneven, fix them. If there are any pets around you, be aware of where they are. Review your medicines with your doctor. Some medicines can make you feel dizzy. This can increase your chance of falling. Ask your doctor what other things that you can do to help prevent falls. This information is not intended to replace advice given to you by your health care provider. Make sure you discuss any questions you have with your health care provider. Document Released: 05/16/2009 Document Revised: 12/26/2015 Document Reviewed: 08/24/2014 Elsevier Interactive Patient Education  2017 [employer].

## 2021-08-06 NOTE — Progress Notes (Signed)
Subjective:   Kaitlyn Parks is a 87 y.o. female who presents for Medicare Annual (Subsequent) preventive examination.  Review of Systems    No ROS Cardiac Risk Factors include: advanced age (>5655men, 16>65 women);hypertension    Objective:    Today's Vitals   08/06/21 0950  BP: 118/62  Pulse: 64  Temp: 97.8 F (36.6 C)  Weight: 118 lb 8 oz (53.8 kg)  Height: 5\' 4"  (1.626 m)   Body mass index is 20.34 kg/m.  Advanced Directives 08/06/2021 11/30/2017 06/16/2017 06/12/2017 06/12/2017 06/10/2017 06/04/2017  Does Patient Have a Medical Advance Directive? Yes Yes Yes Yes Yes Yes Yes  Type of Estate agentAdvance Directive Healthcare Power of [place];Living will - Healthcare Power of [employer] Street Corporationttorney Healthcare Power of [employer]ttorney Healthcare Power of [employer]ttorney Healthcare Power of [employer]ttorney Healthcare Power of [place];Living will  Does patient want to make changes to medical advance directive? No - Patient declined - - No - Patient declined - No - Patient declined -  Copy of Healthcare Power of Attorney in Chart? No - copy requested - - No - copy requested - No - copy requested No - copy requested    Current Medications (verified) Outpatient Encounter Medications as of 08/06/2021  Medication Sig   aspirin EC 81 MG tablet Take 81 mg by mouth daily.   Biotin w/ Vitamins C & E (HAIR/SKIN/NAILS PO) Take 1 tablet by mouth daily.   Cranberry 400 MG CAPS Take 400 mg by mouth daily.   Fish Oil-Lutein-D-Zeaxanthin (EYE OMEGA ADVANTAGE/VIT D-3) CAPS Take by mouth daily.   hydrALAZINE (APRESOLINE) 10 MG tablet 10 mg twice a day, may take extra dose for SBP >160 up to a total of 4 times a day   isosorbide mononitrate (IMDUR) 60 MG 24 hr tablet TAKE 1 & 1/2 TABLETS A DAY.   Multiple Vitamins-Calcium (ONE-A-DAY WOMENS PO) Take 1 tablet by mouth daily.    Multiple Vitamins-Minerals (PRESERVISION AREDS 2 PO) Take 1 capsule by mouth 2 (two) times daily.   nitrofurantoin, macrocrystal-monohydrate, (MACROBID) 100 MG capsule Take by  mouth.   nitroGLYCERIN (NITROSTAT) 0.4 MG SL tablet DISSOLVE 1 TABLET UNDER TONGUE IF NEEDED FOR CHEST PAIN. MAY REPEAT IN 5 MINUTES FOR 3 DOSES.   PRESCRIPTION MEDICATION Avastin eye injections given by Dr Vanessa BarbaraZamora due to macular degeneration   rosuvastatin (CRESTOR) 10 MG tablet TAKE (1/2) TABLET DAILY.   SYNTHROID 50 MCG tablet TAKE 1 TABLET EACH DAY.   No facility-administered encounter medications on file as of 08/06/2021.    Allergies (verified) Acyclovir and related, Pravastatin sodium, Sulfa antibiotics, and Zocor [simvastatin]   History: Past Medical History:  Diagnosis Date   Arthritis    DDD, scoliosis, sees Dr. Lovell SheehanJenkins for this, uses norco very rarely for pain   Bradycardia 11/11/2017   CAD (coronary artery disease)    LAD stenting of a 90% lesion 2012   Cystocele    Educated about COVID-19 virus infection 12/06/2019   Elevated cholesterol    GERD (gastroesophageal reflux disease)    dx in work up 2016 for atypical CP at OSH   pt. denies   Heart murmur    Hypertensive retinopathy    OU   Hypothyroidism    Interstitial cystitis    sees Dr. Annabell HowellsWrenn   Macular degeneration    OU   NSTEMI (non-ST elevated myocardial infarction) [facility](HCC)    Osteoporosis    Rectocele    S/P hip replacement, right 06/12/2017   Scoliosis    Thyroid disease  Hypothyroid   Urinary incontinence    USI   Uterine prolapse    Past Surgical History:  Procedure Laterality Date   BLADDER SUSPENSION  2011   CATARACT EXTRACTION Bilateral 2015   Dr. Elmer Picker   CORONARY ANGIOPLASTY WITH STENT PLACEMENT  04/21/2010   LAD 80%, RCA 30%, nl EF, s/p DES LAD   EYE SURGERY     LEFT HEART CATH AND CORONARY ANGIOGRAPHY N/A 06/14/2017   Procedure: LEFT HEART CATH AND CORONARY ANGIOGRAPHY;  Surgeon: Runell Gess, MD;  Location: [facility];  Service: Cardiovascular;  Laterality: N/A;   OOPHORECTOMY  2011   BSO   TOTAL HIP ARTHROPLASTY Right 06/10/2017   Procedure: RIGHT TOTAL HIP ARTHROPLASTY  ANTERIOR APPROACH;  Surgeon: Samson Frederic, MD;  Location: [facility];  Service: Orthopedics;  Laterality: Right;  Needs RNFA   VAGINAL HYSTERECTOMY  2011   LAVH BSO; benign   Family History  Problem Relation Age of Onset   Hypertension Mother    Heart disease Mother    Heart disease Father    Heart disease Sister    Colon cancer Paternal Aunt 78   Breast cancer Paternal Aunt        Age 57's   Diabetes Sister    Endometrial cancer Sister    Breast cancer Cousin        Maternal 1st cousins-Age 38's   Leukemia Paternal Aunt    Social History   Socioeconomic History   Marital status: Widowed    Spouse name: Not on file   Number of children: Not on file   Years of education: Not on file   Highest education level: Not on file  Occupational History   Not on file  Tobacco Use   Smoking status: Never   Smokeless tobacco: Never  Vaping Use   Vaping Use: Never used  Substance and Sexual Activity   Alcohol use: No    Comment: Rare   Drug use: No   Sexual activity: Never    Birth control/protection: Surgical  Other Topics Concern   Not on file  Social History Narrative   Work or School:  none      Home Situation: lives with husband      Spiritual Beliefs: Methodist      Lifestyle: regular exercise (yoga, water aerobics); diet is healthy      Social Determinants of Corporate investment banker Strain: Not on file  Food Insecurity: Not on file  Transportation Needs: Not on file  Physical Activity: Not on file  Stress: Not on file  Social Connections: Not on file    Tobacco Counseling Counseling given: Not Answered   Clinical Intake:   How often do you need to have someone help you when you read instructions, pamphlets, or other written materials from your doctor or pharmacy?: 1 - Never  Diabetic? No  Activities of Daily Living In your present state of health, do you have any difficulty performing the following activities: 08/04/2021  Hearing? N  Vision? N   Difficulty concentrating or making decisions? N  Walking or climbing stairs? Y  Comment Patient followed by Dr Lorrine Kin  Dressing or bathing? N  Doing errands, shopping? N  Preparing Food and eating ? N  Using the Toilet? N  In the past six months, have you accidently leaked urine? N  Comment Patient wears liners  Do you have problems with loss of bowel control? N  Managing your Medications? N  Managing your Finances?  N  Housekeeping or managing your Housekeeping? N  Some recent data might be hidden    Patient Care Team: Wynn BankerKoberlein, Junell C, MD as PCP - General (Family Medicine) Rollene RotundaHochrein, James, MD as PCP - Cardiology (Cardiology) Carlus PavlovGherghe, Cristina, MD as Consulting Physician (Internal Medicine) Tressie StalkerJenkins, Jeffrey, MD as Consulting Physician (Neurosurgery) Bjorn PippinWrenn, John, MD as Attending Physician (Urology)  Indicate any recent Medical Services you may have received from other than [facility] providers in the past year (date may be approximate).     Assessment:   This is a routine wellness examination for Kaitlyn Parks.  Hearing/Vision screen Hearing Screening - Comments:: No difficulty hearing Vision Screening - Comments:: Wears reading glasses. Followed by Dr Elmer PickerHecker  Dietary issues and exercise activities discussed: Current Exercise Habits: Home exercise routine, Type of exercise: Other - see comments (Patient does water exercise), Time (Minutes): 60, Frequency (Times/Week): 2, Weekly Exercise (Minutes/Week): 120, Intensity: Moderate   Goals Addressed             This Visit's Progress    Exercise 150 min/wk Moderate Activity       Patient wants to maintain current exercise program.       Depression Screen PHQ 2/9 Scores 08/06/2021 09/11/2020 03/11/2020 11/30/2017 08/11/2016 11/17/2013  PHQ - 2 Score 0 0 0 0 0 0    Fall Risk Fall Risk  08/04/2021 09/11/2020 11/30/2017 08/11/2016 11/17/2013  Falls in the past year? 0 0 No No No  Number falls in past yr: 0 0 - - -  Injury with Fall? - 0 - - -     FALL RISK PREVENTION PERTAINING TO THE HOME:  Any stairs in or around the home? Yes  If so, are there any without handrails? No  Home free of loose throw rugs in walkways, pet beds, electrical cords, etc? Yes  Adequate lighting in your home to reduce risk of falls? Yes   ASSISTIVE DEVICES UTILIZED TO PREVENT FALLS:  Life alert? No  Use of a cane, walker or w/c? No  Grab bars in the bathroom? Yes  Shower chair or bench in shower? No  Elevated toilet seat or a handicapped toilet? Yes   TIMED UP AND GO:  Was the test performed? Yes .  Length of time to ambulate 10 feet: 5 sec.   Gait steady and fast without use of assistive device  Cognitive Function: MMSE - Mini Mental State Exam 11/30/2017 08/11/2016  Not completed: (No Data) (No Data)     6CIT Screen 08/06/2021  What Year? 0 points  What month? 0 points  What time? 0 points  Count back from 20 0 points  Months in reverse 0 points  Repeat phrase 0 points  Total Score 0    Immunizations Immunization History  Administered Date(s) Administered   Influenza Whole 08/11/2007   Influenza, High Dose Seasonal PF 08/06/2014, 04/30/2017, 06/20/2018, 05/07/2021   Influenza,inj,Quad PF,6+ Mos 04/24/2013   Influenza-Unspecified 06/03/2016, 05/04/2019, 04/03/2020   PFIZER(Purple Top)SARS-COV-2 Vaccination 08/13/2019, 09/02/2019, 11/02/2019   Pfizer Covid-19 Vaccine Bivalent Booster 6132yrs & up 05/07/2021   Pneumococcal Conjugate-13 02/19/2014   Pneumococcal Polysaccharide-23 09/13/2007   Td 11/04/1995, 09/13/2007   Tdap 03/04/2018   Zoster Recombinat (Shingrix) 03/04/2018, 05/10/2018        Screening Tests Health Maintenance  Topic Date Due   MAMMOGRAM  10/09/2021   TETANUS/TDAP  03/04/2028   Pneumonia Vaccine 3665+ Years old  Completed   INFLUENZA VACCINE  Completed   DEXA SCAN  Completed   COVID-19 Vaccine  Completed  Zoster Vaccines- Shingrix  Completed   HPV VACCINES  Aged Out    Health Maintenance  There  are no preventive care reminders to display for this patient.   Additional Screening:   Vision Screening: Recommended annual ophthalmology exams for early detection of glaucoma and other disorders of the eye. Is the patient up to date with their annual eye exam?  Yes  Who is the provider or what is the name of the office in which the patient attends annual eye exams? Followed by Dr Elmer Picker.    Dental Screening: Recommended annual dental exams for proper oral hygiene  Community Resource Referral / Chronic Care Management:  CRR required this visit?  No   CCM required this visit?  No      Plan:     I have personally reviewed and noted the following in the patients chart:   Medical and social history Use of alcohol, tobacco or illicit drugs  Current medications and supplements including opioid prescriptions. Patient currently not taking opioids Functional ability and status Nutritional status Physical activity Advanced directives List of other physicians Hospitalizations, surgeries, and ER visits in previous 12 months Vitals Screenings to include cognitive, depression, and falls Referrals and appointments  In addition, I have reviewed and discussed with patient certain preventive protocols, quality metrics, and best practice recommendations. A written personalized care plan for preventive services as well as general preventive health recommendations were provided to patient.     JALINA BLOWERS, LPN   4/0/9811

## 2021-08-13 ENCOUNTER — Other Ambulatory Visit (INDEPENDENT_AMBULATORY_CARE_PROVIDER_SITE_OTHER): Payer: Medicare Other

## 2021-08-13 ENCOUNTER — Encounter: Payer: Self-pay | Admitting: Family Medicine

## 2021-08-13 ENCOUNTER — Ambulatory Visit (INDEPENDENT_AMBULATORY_CARE_PROVIDER_SITE_OTHER): Payer: Medicare Other | Admitting: Family Medicine

## 2021-08-13 VITALS — BP 116/70 | HR 53 | Temp 97.6°F | Resp 18 | Ht 64.0 in | Wt 126.1 lb

## 2021-08-13 DIAGNOSIS — E785 Hyperlipidemia, unspecified: Secondary | ICD-10-CM | POA: Diagnosis not present

## 2021-08-13 DIAGNOSIS — I2510 Atherosclerotic heart disease of native coronary artery without angina pectoris: Secondary | ICD-10-CM

## 2021-08-13 DIAGNOSIS — R109 Unspecified abdominal pain: Secondary | ICD-10-CM | POA: Diagnosis not present

## 2021-08-13 DIAGNOSIS — R110 Nausea: Secondary | ICD-10-CM

## 2021-08-13 DIAGNOSIS — R195 Other fecal abnormalities: Secondary | ICD-10-CM

## 2021-08-13 DIAGNOSIS — E039 Hypothyroidism, unspecified: Secondary | ICD-10-CM

## 2021-08-13 DIAGNOSIS — I10 Essential (primary) hypertension: Secondary | ICD-10-CM

## 2021-08-13 DIAGNOSIS — H353211 Exudative age-related macular degeneration, right eye, with active choroidal neovascularization: Secondary | ICD-10-CM

## 2021-08-13 DIAGNOSIS — I700 Atherosclerosis of aorta: Secondary | ICD-10-CM

## 2021-08-13 DIAGNOSIS — Z0000 Encounter for general adult medical examination without abnormal findings: Secondary | ICD-10-CM

## 2021-08-13 DIAGNOSIS — R42 Dizziness and giddiness: Secondary | ICD-10-CM

## 2021-08-13 LAB — COMPREHENSIVE METABOLIC PANEL
ALT: 12 U/L (ref 0–35)
AST: 15 U/L (ref 0–37)
Albumin: 3.8 g/dL (ref 3.5–5.2)
Alkaline Phosphatase: 81 U/L (ref 39–117)
BUN: 18 mg/dL (ref 6–23)
CO2: 31 mEq/L (ref 19–32)
Calcium: 8.9 mg/dL (ref 8.4–10.5)
Chloride: 101 mEq/L (ref 96–112)
Creatinine, Ser: 0.71 mg/dL (ref 0.40–1.20)
GFR: 78.1 mL/min (ref >60.00)
Glucose, Bld: 81 mg/dL (ref 70–99)
Potassium: 3.6 mEq/L (ref 3.5–5.1)
Sodium: 138 mEq/L (ref 135–145)
Total Bilirubin: 0.4 mg/dL (ref 0.2–1.2)
Total Protein: 6.4 g/dL (ref 6.0–8.3)

## 2021-08-13 LAB — CBC WITH DIFFERENTIAL/PLATELET
Basophils Absolute: 0 10*3/uL (ref 0.0–0.1)
Basophils Relative: 0.5 % (ref 0.0–3.0)
Eosinophils Absolute: 0.1 10*3/uL (ref 0.0–0.7)
Eosinophils Relative: 1.3 % (ref 0.0–5.0)
HCT: 39.4 % (ref 36.0–46.0)
Hemoglobin: 12.7 g/dL (ref 12.0–15.0)
Lymphocytes Relative: 14.1 % (ref 12.0–46.0)
Lymphs Abs: 0.8 10*3/uL (ref 0.7–4.0)
MCHC: 32.2 g/dL (ref 30.0–36.0)
MCV: 92.2 fl (ref 78.0–100.0)
Monocytes Absolute: 0.7 10*3/uL (ref 0.1–1.0)
Monocytes Relative: 12.7 % — ABNORMAL HIGH (ref 3.0–12.0)
Neutro Abs: 4 10*3/uL (ref 1.4–7.7)
Neutrophils Relative %: 71.4 % (ref 43.0–77.0)
Platelets: 261 10*3/uL (ref 150.0–400.0)
RBC: 4.27 Mil/uL (ref 3.87–5.11)
RDW: 13.1 % (ref 11.5–15.5)
WBC: 5.6 10*3/uL (ref 4.0–10.5)

## 2021-08-13 LAB — AMYLASE: Amylase: 49 U/L (ref 27–131)

## 2021-08-13 LAB — TSH: TSH: 0.91 u[IU]/mL (ref 0.35–5.50)

## 2021-08-13 LAB — LIPASE: Lipase: 23 U/L (ref 11.0–59.0)

## 2021-08-13 NOTE — Progress Notes (Signed)
Kaitlyn Parks DOB: 06-Nov-1934 Encounter date: 08/13/2021  This is a 87 y.o. female who presents for complete physical   History of present illness/Additional concerns:  Has been using children's probiotic. Mid December (17th) ate beef tips at [employer] and got very ill after, vomiting. Hasn't felt well since. No appetite, dizzy, and stomach is burning. Yesterday ate salad for first time and it made her sick, was painful.gas x and pepto bismol helped. Staying nauseous most of the time. Eats because she has to. Eating oatmeal with bananas. Didn't eat last night. Didn't eat yet today. Limited to chicken. Hasn't been eating cheese. Can eat baked potato. She is having loose bowel movements, not diarrhea. Sometimes feels right now having 2 bowel movements in a day. No fevers. She is urinating ok. Lower abd discomfort, burning. The nausea is bad.   Doing water exercise twice weekly. This is helping with her spine issues. Wsa supposed to get spine injection end December. Has been pushed off to end of January.    CAD/HL/HTN: taking crestor 5mg  daily.  Hydralazine 10 mg twice daily as needed, Imdur 90 mg daily.  She is on baby aspirin daily. GERD: not having issues with acid reflux lately; except for when she ate pizza last week. Doesn't usually eat this because it tends to bother her.   Hypothyroid: synthroid daily.    Macular degeneration: still feels like vision is decreasing. She is getting injections every 8 weeks. Right now getting in both eyes.   Last dexa 09/2018: follows with endo  Past Medical History:  Diagnosis Date   Arthritis    DDD, scoliosis, sees Dr. 10/2018 for this, uses norco very rarely for pain   Bradycardia 11/11/2017   CAD (coronary artery disease)    LAD stenting of a 90% lesion 2012   Cystocele    Educated about COVID-19 virus infection 12/06/2019   Elevated cholesterol    GERD (gastroesophageal reflux disease)    dx in work up 2016 for atypical CP at OSH   pt. denies    Heart murmur    Hypertensive retinopathy    OU   Hypothyroidism    Interstitial cystitis    sees Dr. 2017   Macular degeneration    OU   NSTEMI (non-ST elevated myocardial infarction) [facility])    Osteoporosis    Rectocele    S/P hip replacement, right 06/12/2017   Scoliosis    Thyroid disease    Hypothyroid   Urinary incontinence    USI   Uterine prolapse    Past Surgical History:  Procedure Laterality Date   BLADDER SUSPENSION  2011   CATARACT EXTRACTION Bilateral 2015   Dr. 2016   CORONARY ANGIOPLASTY WITH STENT PLACEMENT  04/21/2010   LAD 80%, RCA 30%, nl EF, s/p DES LAD   EYE SURGERY     LEFT HEART CATH AND CORONARY ANGIOGRAPHY N/A 06/14/2017   Procedure: LEFT HEART CATH AND CORONARY ANGIOGRAPHY;  Surgeon: 13/07/2017, MD;  Location: [facility];  Service: Cardiovascular;  Laterality: N/A;   OOPHORECTOMY  2011   BSO   TOTAL HIP ARTHROPLASTY Right 06/10/2017   Procedure: RIGHT TOTAL HIP ARTHROPLASTY ANTERIOR APPROACH;  Surgeon: 13/03/2017, MD;  Location: [facility];  Service: Orthopedics;  Laterality: Right;  Needs RNFA   VAGINAL HYSTERECTOMY  2011   LAVH BSO; benign   Allergies  Allergen Reactions   Acyclovir And Related Other (See Comments)    unknown   Pravastatin Sodium Other (See  Comments)    cystitis   Sulfa Antibiotics     nausea   Zocor [Simvastatin] Other (See Comments)    cystitis   Current Meds  Medication Sig   aspirin EC 81 MG tablet Take 81 mg by mouth daily.   Biotin w/ Vitamins C & E (HAIR/SKIN/NAILS PO) Take 1 tablet by mouth daily.   Cranberry 400 MG CAPS Take 400 mg by mouth daily.   hydrALAZINE (APRESOLINE) 10 MG tablet 10 mg twice a day, may take extra dose for SBP >160 up to a total of 4 times a day   isosorbide mononitrate (IMDUR) 60 MG 24 hr tablet TAKE 1 & 1/2 TABLETS A DAY.   Multiple Vitamins-Calcium (ONE-A-DAY WOMENS PO) Take 1 tablet by mouth daily.    Multiple Vitamins-Minerals (PRESERVISION AREDS 2 PO) Take 1  capsule by mouth 2 (two) times daily.   nitroGLYCERIN (NITROSTAT) 0.4 MG SL tablet DISSOLVE 1 TABLET UNDER TONGUE IF NEEDED FOR CHEST PAIN. MAY REPEAT IN 5 MINUTES FOR 3 DOSES.   PRESCRIPTION MEDICATION Avastin eye injections given by Dr Vanessa BarbaraZamora due to macular degeneration   rosuvastatin (CRESTOR) 10 MG tablet TAKE (1/2) TABLET DAILY.   SYNTHROID 50 MCG tablet TAKE 1 TABLET EACH DAY.   Social History   Tobacco Use   Smoking status: Never   Smokeless tobacco: Never  Substance Use Topics   Alcohol use: No    Comment: Rare   Family History  Problem Relation Age of Onset   Hypertension Mother    Heart disease Mother    Heart disease Father    Heart disease Sister    Colon cancer Paternal Aunt [id number]   Breast cancer Paternal Aunt        Age 85's   Diabetes Sister    Endometrial cancer Sister    Breast cancer Cousin        Maternal 1st cousins-Age 85's   Leukemia Paternal Aunt      Review of Systems  Constitutional:  Negative for chills, fatigue and fever.  Respiratory:  Negative for cough, chest tightness, shortness of breath and wheezing.   Cardiovascular:  Negative for chest pain, palpitations and leg swelling.  Gastrointestinal:  Positive for abdominal pain (more burning, lower abd) and nausea. Negative for blood in stool, constipation and rectal pain. Diarrhea: stools loose, but somewhat formed. Vomiting: none since original incident.  CBC:  Lab Results  Component Value Date   WBC 5.6 08/13/2021   HGB 12.7 08/13/2021   HCT 39.4 08/13/2021   MCH 30.2 03/04/2020   MCHC 32.2 08/13/2021   RDW 13.1 08/13/2021   PLT 261.0 08/13/2021   MPV 10.3 03/04/2020   CMP: Lab Results  Component Value Date   NA 138 08/13/2021   K 3.6 08/13/2021   CL 101 08/13/2021   CO2 31 08/13/2021   ANIONGAP 8 10/09/2018   GLUCOSE 81 08/13/2021   BUN 18 08/13/2021   CREATININE 0.71 08/13/2021   CREATININE 0.77 03/04/2020   GFRAA >60 10/09/2018   CALCIUM 8.9 08/13/2021   CALCIUM 9.0  07/01/2010   PROT 6.4 08/13/2021   PROT 6.7 02/05/2017   BILITOT 0.4 08/13/2021   BILITOT 0.3 02/05/2017   ALKPHOS 81 08/13/2021   ALT 12 08/13/2021   AST 15 08/13/2021   LIPID: Lab Results  Component Value Date   CHOL 159 03/05/2021   CHOL 160 02/05/2017   TRIG 92.0 03/05/2021   HDL 61.10 03/05/2021   HDL 52 02/05/2017   LDLCALC 80 03/05/2021  LDLCALC 72 03/04/2020   LABVLDL 31 02/05/2017    Objective:  BP 116/70    Pulse (!) 53    Temp 97.6 F (36.4 C) (Oral)    Resp 18    Ht 5\' 4"  (1.626 m)    Wt 126 lb 1.6 oz (57.2 kg)    SpO2 98%    BMI 21.65 kg/m   Weight: 126 lb 1.6 oz (57.2 kg)   BP Readings from Last 3 Encounters:  08/13/21 116/70  08/06/21 118/62  04/24/21 128/66   Wt Readings from Last 3 Encounters:  08/13/21 126 lb 1.6 oz (57.2 kg)  08/06/21 118 lb 8 oz (53.8 kg)  04/24/21 123 lb (55.8 kg)    Physical Exam Constitutional:      General: She is not in acute distress.    Appearance: She is well-developed.  HENT:     Head: Normocephalic and atraumatic.     Right Ear: External ear normal.     Left Ear: External ear normal.     Mouth/Throat:     Pharynx: No oropharyngeal exudate.  Eyes:     Conjunctiva/sclera: Conjunctivae normal.     Pupils: Pupils are equal, round, and reactive to light.  Neck:     Thyroid: No thyromegaly.  Cardiovascular:     Rate and Rhythm: Normal rate and regular rhythm.     Heart sounds: Normal heart sounds. No murmur heard.   No friction rub. No gallop.  Pulmonary:     Effort: Pulmonary effort is normal.     Breath sounds: Normal breath sounds.  Abdominal:     General: Bowel sounds are normal. There is no distension.     Palpations: Abdomen is soft. There is no mass.     Tenderness: There is abdominal tenderness (mild lower abd). There is no right CVA tenderness, left CVA tenderness, guarding or rebound.     Hernia: No hernia is present.  Musculoskeletal:        General: No tenderness or deformity. Normal range of  motion.     Cervical back: Normal range of motion and neck supple.  Lymphadenopathy:     Cervical: No cervical adenopathy.     Lower Body: No right inguinal adenopathy. No left inguinal adenopathy.     Comments: 1cm mobile cyst vs lipoma left inguinal area  Skin:    General: Skin is warm and dry.     Findings: No rash.  Neurological:     Mental Status: She is alert and oriented to person, place, and time.     Deep Tendon Reflexes: Reflexes normal.     Reflex Scores:      Tricep reflexes are 2+ on the right side and 2+ on the left side.      Bicep reflexes are 2+ on the right side and 2+ on the left side.      Brachioradialis reflexes are 2+ on the right side and 2+ on the left side.      Patellar reflexes are 2+ on the right side and 2+ on the left side. Psychiatric:        Speech: Speech normal.        Behavior: Behavior normal.        Thought Content: Thought content normal.    Assessment/Plan: There are no preventive care reminders to display for this patient. Health Maintenance reviewed.  1. Preventative health care She is up-to-date with preventative health care.  We will address abdominal discomfort and nausea today.  2.  Primary hypertension Blood pressure well controlled.  Continue current medications.  3. Coronary artery disease involving native heart without angina pectoris, unspecified vessel or lesion type Continue Crestor 5 mg daily, blood pressure control.  4. Aortic atherosclerosis (HCC) On Crestor.  5. Hypothyroidism (acquired) Synthroid 50 mcg daily.  6. Dyslipidemia Crestor 5 mg daily.  7. Exudative age-related macular degeneration of right eye with active choroidal neovascularization (HCC) Follows regularly with ophthalmology.  Getting injections monthly.  8. Dizziness Notes more with changes in position.  This has been occurring since she had episode of nausea, vomiting, loose stools.  9. Nausea Antinausea medication choices are limited secondary  to prolonged QT.  She is managing currently with ginger ale and I encouraged her to continue with this.  10. Loose stools Start with blood work.  Suggested avoiding dairy (which she is already doing).  Further evaluation pending these results. - Magnesium, RBC; Future - TSH; Future - Fecal leukocytes; Future - Fecal Occult Blood, Guaiac; Future - Clostridium Difficile by PCR; Future - Stool culture; Future  11. Abdominal pain, unspecified abdominal location Mild on exam, but discomfort does not seem to be resolving on its own.  Further evaluation pending results. - Urinalysis with Culture Reflex; Future - CBC with Differential/Platelet; Future - Comprehensive metabolic panel; Future - Lipase; Future - Amylase; Future - DG Abd 2 Views; Future   Return for pending bloodwork evaluation.Theodis Shove, MD

## 2021-08-15 ENCOUNTER — Other Ambulatory Visit (INDEPENDENT_AMBULATORY_CARE_PROVIDER_SITE_OTHER): Payer: Medicare Other

## 2021-08-15 DIAGNOSIS — R195 Other fecal abnormalities: Secondary | ICD-10-CM | POA: Diagnosis not present

## 2021-08-16 LAB — URINE CULTURE
MICRO NUMBER:: 12862016
SPECIMEN QUALITY:: ADEQUATE

## 2021-08-16 LAB — CULTURE INDICATED

## 2021-08-16 LAB — URINALYSIS W MICROSCOPIC + REFLEX CULTURE
Bacteria, UA: NONE SEEN /HPF
Bilirubin Urine: NEGATIVE
Glucose, UA: NEGATIVE
Hgb urine dipstick: NEGATIVE
Hyaline Cast: NONE SEEN /LPF
Ketones, ur: NEGATIVE
Nitrites, Initial: NEGATIVE
Protein, ur: NEGATIVE
RBC / HPF: NONE SEEN /HPF (ref 0–2)
Specific Gravity, Urine: 1.014 (ref 1.001–1.035)
pH: 6.5 (ref 5.0–8.0)

## 2021-08-16 LAB — MAGNESIUM, RBC: Magnesium RBC: 6.9 mg/dL — ABNORMAL HIGH (ref 4.0–6.4)

## 2021-08-17 LAB — CLOSTRIDIUM DIFFICILE BY PCR: Toxigenic C. Difficile by PCR: NEGATIVE

## 2021-08-18 ENCOUNTER — Encounter: Payer: Self-pay | Admitting: Gastroenterology

## 2021-08-18 ENCOUNTER — Telehealth: Payer: Self-pay | Admitting: *Deleted

## 2021-08-18 LAB — FECAL OCCULT BLOOD, IMMUNOCHEMICAL: Fecal Occult Bld: POSITIVE — AB

## 2021-08-18 NOTE — Telephone Encounter (Signed)
Noted-message sent to PCP.

## 2021-08-18 NOTE — Telephone Encounter (Signed)
CRITICAL VALUE STICKER  CRITICAL VALUE: Positive IFOB  RECEIVER (on-site recipient of call):  Administrator here at [employer]  DATE & TIME NOTIFIED: 08/18/2021 at 8:25am via teams message  MESSENGER (representative from lab): unknown  MD NOTIFIED: Dr Caryl Never as PCP is out of the office  TIME OF NOTIFICATION: 08/18/2021 at 8:25am  RESPONSE:

## 2021-08-19 ENCOUNTER — Telehealth: Payer: Self-pay | Admitting: Family Medicine

## 2021-08-19 LAB — STOOL CULTURE: E coli, Shiga toxin Assay: NEGATIVE

## 2021-08-19 NOTE — Telephone Encounter (Signed)
The patient called in stating that she had a question for Dr.Koberlein. The patient asked " what is the problem with high monocytes relative?"  Patient could be contacted at [phone].  Please advise.

## 2021-08-19 NOTE — Telephone Encounter (Signed)
Patient informed of the results and message below.  Patient stated she called and has an appt with Dr Russella Dar in 5 weeks and is concerned about three of her test results, including the magnesium, fecal occult and RBCS.  Patient was reminded of results for the magnesium per PCP and this message was related to the fecal occult testing. Message sent to PCP regarding RBCs and to confirm this appt.

## 2021-08-19 NOTE — Telephone Encounter (Signed)
Hi Dr.Stark - just wondering if you had any earlier appointments to see Evona?  I just spoke with her. One month ago had episode after eating where she had vomiting, loose stools. Lasted a day, but she kept the loose stools, nausea. I saw her last week. Eval was ok except for +fobt, elevated mg. On hindsight thinking, she recalls some reddish/orangish stools for a longer period of time. Her nausea is better, but she still has some abdominal discomfort. I have asked her to stop the prelief she was taking because her magnesium was high (and she says this was only supplement containing mg) and we discussed this could affect her bowels. If you are not able to see her sooner, please let me know if there is any additional eval you would like before you see her.

## 2021-08-19 NOTE — Telephone Encounter (Signed)
In reviewing her chart Kaitlyn Parks, [place] and Yancey Flemings, MD cared for her in Jan 2018. We will see if they have availability to see her sooner. Thanks.

## 2021-08-19 NOTE — Telephone Encounter (Signed)
Noted.I spoke with patient and sent message to dr. Russella Dar to see about availability for earlier visit. Kaitlyn Parks is concerned after looking up abnormal results online and possibilities of diagnoses. See other message. We did review all results together.

## 2021-08-19 NOTE — Telephone Encounter (Signed)
I would suggest GI followup given symptoms for their expertise. I sent another note on her today regarding magnesium; so if abd is feeling better then it is not as urgent. If still having loose stools and discomfort I would like for GI to see her within a couple of weeks.

## 2021-08-20 LAB — FECAL LEUKOCYTES
Micro Number: 12868697
Result: NOT DETECTED
Specimen Quality: ADEQUATE

## 2021-08-20 NOTE — Telephone Encounter (Signed)
Called and spoke with pt. Pt is scheduled to see Hyacinth Meeker, PA on 08/25/21 at 1:30 pm. Pt verbalized understanding and had no other concerns at end of call.

## 2021-08-20 NOTE — Telephone Encounter (Signed)
Please let patient know that GI is looking at her chart and they will see if they can get her in with one of the providers she has seen in their group in the past. Let me know if she doesn't hear from them for scheduling by Monday.

## 2021-08-20 NOTE — Telephone Encounter (Signed)
Kaitlyn Parks, see notes below.  Hx with [name]

## 2021-08-25 ENCOUNTER — Ambulatory Visit: Payer: Medicare Other | Admitting: Physician Assistant

## 2021-08-25 ENCOUNTER — Encounter: Payer: Self-pay | Admitting: Physician Assistant

## 2021-08-25 VITALS — BP 120/50 | HR 56 | Ht 62.5 in | Wt 126.1 lb

## 2021-08-25 DIAGNOSIS — R197 Diarrhea, unspecified: Secondary | ICD-10-CM | POA: Diagnosis not present

## 2021-08-25 DIAGNOSIS — R112 Nausea with vomiting, unspecified: Secondary | ICD-10-CM | POA: Diagnosis not present

## 2021-08-25 DIAGNOSIS — R195 Other fecal abnormalities: Secondary | ICD-10-CM | POA: Diagnosis not present

## 2021-08-25 NOTE — Progress Notes (Signed)
Physician assistant assessment and plan noted 

## 2021-08-25 NOTE — Patient Instructions (Signed)
Start daily children's probiotic for 2 months.  If you are age 85 or older, your body mass index should be between 23-30. Your Body mass index is 22.7 kg/m. If this is out of the aforementioned range listed, please consider follow up with your Primary Care Provider.  If you are age 75 or younger, your body mass index should be between 19-25. Your Body mass index is 22.7 kg/m. If this is out of the aformentioned range listed, please consider follow up with your Primary Care Provider.   ________________________________________________________  The [facility] providers would like to encourage you to use [facility] to communicate with providers for non-urgent requests or questions.  Due to long hold times on the telephone, sending your provider a message by [facility] may be a faster and more efficient way to get a response.  Please allow 48 business hours for a response.  Please remember that this is for non-urgent requests.  _______________________________________________________

## 2021-08-25 NOTE — Progress Notes (Signed)
Chief Complaint: Abdominal pain and blood in stools  HPI:    Kaitlyn Parks is an 87 year old Caucasian female, known to Dr. [name], with a past medical history of CAD, cystocele, NSTEMI and multiple others listed below, who was referred to me by Wynn Banker, MD for a complaint of abdominal pain and blood in stools.    08/18/2016 patient seen in clinic by me and discussed Cologuard.  Cologuard returned - 09/04/2016 was discussed patient had no further screening for colon cancer.    08/13/2021 CBC and CMP normal.  She was seen in clinic by her PCP at that time and discussed that since mid December after eating beef tips at a luncheon she had gotten very ill with vomiting and had not felt well since.  Describes no appetite, dizziness and stomach burning.  Apparently had Salmonella before that visit and it made her sick and was painful, Gas-X and Pepto-Bismol helped.  She remained nauseous since that time.  We discussed 2 loose bowel movements a day.  Discussed that antinausea medication was limited secondary to prolonged QT and she was told to continue ginger ale.  It was recommended she avoid dairy and she had labs and stool studies.  An abdominal x-ray was ordered.    08/15/2021 patient had a fecal occult blood study which was positive.  Other fecal results including fecal leukocytes, C. difficile, stool culture all negative.    Today, the patient tells me that in mid December she had some steak tips which seemed to set off a reaction of abdominal pain, diarrhea and nausea with vomiting.  All of her symptoms have gotten better/gone away, she has felt much better over the past couple of weeks.  Tells me that today she has a little bit of nausea and dizziness but thinks this is because her eyes were dilated earlier this morning.  Prior to that she has been feeling fine.  Does tell me that she still goes to water exercise 2 days a week and play Bridge.  She tells me that as she has gotten older she does have  to have time to rest, but in general is able to do what she wants to do with just a little lethargy.  Currently her bowel habits are back to normal.  She tells me they are soft and formed unless she has any dairy products at which point they are loose.  Denies seeing any bright red blood in her stool or black tarry sticky stools.  Things are better.    She has a daughter that lives in [place] with 2 grown grandsons in their 29s.    Denies fever, chills, weight loss, further vomiting or symptoms that awaken her from sleep.  Past Medical History:  Diagnosis Date   Arthritis    DDD, scoliosis, sees Dr. Lovell Sheehan for this, uses norco very rarely for pain   Bradycardia 11/11/2017   CAD (coronary artery disease)    LAD stenting of a 90% lesion 2012   Cystocele    Educated about COVID-19 virus infection 12/06/2019   Elevated cholesterol    GERD (gastroesophageal reflux disease)    dx in work up 2016 for atypical CP at OSH   pt. denies   Heart murmur    Hypertensive retinopathy    OU   Hypothyroidism    Interstitial cystitis    sees Dr. [name]   Macular degeneration    OU   NSTEMI (non-ST elevated myocardial infarction) [facility])    Osteoporosis  Rectocele    S/P hip replacement, right 06/12/2017   Scoliosis    Thyroid disease    Hypothyroid   Urinary incontinence    USI   Uterine prolapse     Past Surgical History:  Procedure Laterality Date   BLADDER SUSPENSION  2011   CATARACT EXTRACTION Bilateral 2015   Dr. Elmer Picker   CORONARY ANGIOPLASTY WITH STENT PLACEMENT  04/21/2010   LAD 80%, RCA 30%, nl EF, s/p DES LAD   EYE SURGERY     LEFT HEART CATH AND CORONARY ANGIOGRAPHY N/A 06/14/2017   Procedure: LEFT HEART CATH AND CORONARY ANGIOGRAPHY;  Surgeon: Runell Gess, MD;  Location: [facility];  Service: Cardiovascular;  Laterality: N/A;   OOPHORECTOMY  2011   BSO   TOTAL HIP ARTHROPLASTY Right 06/10/2017   Procedure: RIGHT TOTAL HIP ARTHROPLASTY ANTERIOR APPROACH;  Surgeon:  Samson Frederic, MD;  Location: [facility];  Service: Orthopedics;  Laterality: Right;  Needs RNFA   VAGINAL HYSTERECTOMY  2011   [facility] BSO; benign    Current Outpatient Medications  Medication Sig Dispense Refill   aspirin EC 81 MG tablet Take 81 mg by mouth daily.     Biotin w/ Vitamins C & E (HAIR/SKIN/NAILS PO) Take 1 tablet by mouth daily.     Cranberry 400 MG CAPS Take 400 mg by mouth daily.     hydrALAZINE (APRESOLINE) 10 MG tablet 10 mg twice a day, may take extra dose for SBP >160 up to a total of 4 times a day 180 tablet 3   isosorbide mononitrate (IMDUR) 60 MG 24 hr tablet TAKE 1 & 1/2 TABLETS A DAY. 135 tablet 1   Multiple Vitamins-Calcium (ONE-A-DAY WOMENS PO) Take 1 tablet by mouth daily.      Multiple Vitamins-Minerals (PRESERVISION AREDS 2 PO) Take 1 capsule by mouth 2 (two) times daily.     nitrofurantoin, macrocrystal-monohydrate, (MACROBID) 100 MG capsule Take by mouth. (Patient not taking: Reported on 08/13/2021)     nitroGLYCERIN (NITROSTAT) 0.4 MG SL tablet DISSOLVE 1 TABLET UNDER TONGUE IF NEEDED FOR CHEST PAIN. MAY REPEAT IN 5 MINUTES FOR 3 DOSES. 25 tablet 0   PRESCRIPTION MEDICATION Avastin eye injections given by Dr Vanessa Barbara due to macular degeneration     rosuvastatin (CRESTOR) 10 MG tablet TAKE (1/2) TABLET DAILY. 45 tablet 8   SYNTHROID 50 MCG tablet TAKE 1 TABLET EACH DAY. 90 tablet 1   No current facility-administered medications for this visit.    Allergies as of 08/25/2021 - Review Complete 08/13/2021  Allergen Reaction Noted   Acyclovir and related Other (See Comments) 06/04/2017   Pravastatin sodium Other (See Comments) 05/30/2012   Sulfa antibiotics  09/15/2020   Zocor [simvastatin] Other (See Comments) 05/30/2012    Family History  Problem Relation Age of Onset   Hypertension Mother    Heart disease Mother    Heart disease Father    Heart disease Sister    Colon cancer Paternal Aunt 80   Breast cancer Paternal Aunt        Age 51's   Diabetes  Sister    Endometrial cancer Sister    Breast cancer Cousin        Maternal 1st cousins-Age 19's   Leukemia Paternal Aunt     Social History   Socioeconomic History   Marital status: Widowed    Spouse name: Not on file   Number of children: Not on file   Years of education: Not on file   Highest  education level: Not on file  Occupational History   Not on file  Tobacco Use   Smoking status: Never   Smokeless tobacco: Never  Vaping Use   Vaping Use: Never used  Substance and Sexual Activity   Alcohol use: No    Comment: Rare   Drug use: No   Sexual activity: Never    Birth control/protection: Surgical  Other Topics Concern   Not on file  Social History Narrative   Work or School:  none      Home Situation: lives with husband      Spiritual Beliefs: [employer]      Lifestyle: regular exercise (yoga, water aerobics); diet is healthy      Social Determinants of Corporate investment bankerHealth   Financial Resource Strain: Not on file  Food Insecurity: Not on file  Transportation Needs: Not on file  Physical Activity: Not on file  Stress: Not on file  Social Connections: Not on file  Intimate Partner Violence: Not At Risk   Fear of Current or Ex-Partner: No   Emotionally Abused: No   Physically Abused: No   Sexually Abused: No    Review of Systems:    Constitutional: No weight loss, fever or chills Skin: No rash  Cardiovascular: No chest pain Respiratory: No SOB  Gastrointestinal: See HPI and otherwise negative Genitourinary: No dysuria  Neurological: No headache Musculoskeletal: No new muscle or joint pain Hematologic: No bruising Psychiatric: No history of depression or anxiety   Physical Exam:  Vital signs: BP (!) 120/50 (BP Location: Left Arm, Patient Position: Sitting, Cuff Size: Normal)    Pulse (!) 56 Comment: irregular   Ht 5' 2.5" (1.588 m) Comment: height measured without shoes   Wt 126 lb 2 oz (57.2 kg)    BMI 22.70 kg/m    Constitutional:   Pleasant elderly  Caucasian female appears to be in NAD, Well developed, Well nourished, alert and cooperative Head:  Normocephalic and atraumatic. Eyes:   PEERL, EOMI. No icterus. Conjunctiva pink. Ears:  Normal auditory acuity. Neck:  Supple Throat: Oral cavity and pharynx without inflammation, swelling or lesion.  Respiratory: Respirations even and unlabored. Lungs clear to auscultation bilaterally.   No wheezes, crackles, or rhonchi.  Cardiovascular: Normal S1, S2. No MRG. Regular rate and rhythm. No peripheral edema, cyanosis or pallor.  Gastrointestinal:  Soft, nondistended, nontender. No rebound or guarding. Normal bowel sounds. No appreciable masses or hepatomegaly. Rectal:  Not performed.  Msk:  Symmetrical without gross deformities. Without edema, no deformity or joint abnormality.  Neurologic:  Alert and  oriented x4;  grossly normal neurologically.  Skin:   Dry and intact without significant lesions or rashes. Psychiatric: Demonstrates good judgement and reason without abnormal affect or behaviors.  RELEVANT LABS AND IMAGING: CBC    Component Value Date/Time   WBC 5.6 08/13/2021 1145   RBC 4.27 08/13/2021 1145   HGB 12.7 08/13/2021 1145   HCT 39.4 08/13/2021 1145   PLT 261.0 08/13/2021 1145   MCV 92.2 08/13/2021 1145   MCH 30.2 03/04/2020 0828   MCHC 32.2 08/13/2021 1145   RDW 13.1 08/13/2021 1145   LYMPHSABS 0.8 08/13/2021 1145   MONOABS 0.7 08/13/2021 1145   EOSABS 0.1 08/13/2021 1145   BASOSABS 0.0 08/13/2021 1145    CMP     Component Value Date/Time   NA 138 08/13/2021 1145   K 3.6 08/13/2021 1145   CL 101 08/13/2021 1145   CO2 31 08/13/2021 1145   GLUCOSE 81 08/13/2021 1145  BUN 18 08/13/2021 1145   CREATININE 0.71 08/13/2021 1145   CREATININE 0.77 03/04/2020 0828   CALCIUM 8.9 08/13/2021 1145   CALCIUM 9.0 07/01/2010 2255   PROT 6.4 08/13/2021 1145   PROT 6.7 02/05/2017 0834   ALBUMIN 3.8 08/13/2021 1145   ALBUMIN 4.4 02/05/2017 0834   AST 15 08/13/2021 1145   ALT  12 08/13/2021 1145   ALKPHOS 81 08/13/2021 1145   BILITOT 0.4 08/13/2021 1145   BILITOT 0.3 02/05/2017 0834   GFRNONAA >60 10/09/2018 0928   GFRAA >60 10/09/2018 0928    Assessment: 1.  Occult positive blood in stool: Stool was tested at time of symptoms below and was positive for blood, patient has not been seeing any bright red blood in her stool or having melena, blood counts are normal, her symptoms have since resolved; likely from irritated hemorrhoids or other during diarrheal symptoms 2.  Abdominal pain/nausea/diarrhea: This was an acute episode in mid December which is since resolved; likely food poisoning/viral gastroenteritis/bacterial gastroenteritis  Plan: 1.  Discussed with patient that I do not feel that the benefit outweighs the risk of her having a colonoscopy at this point.  I would suggest that she restart her children's probiotic (adult ones give her diarrhea) and take this daily for the next couple of months.  If her symptoms seem to come back or she sees any bright red blood or black tarry stools then she should definitely call and let us know.  At that point may need to rethink procedures. 2.  Patient to follow in clinic with us as needed.  Kaitlyn MeekerJennifer Maksymilian Mabey, PA-C [facility] 08/25/2021, 1:20 PM  Cc: Wynn BankerKoberlein, Junell C, MD

## 2021-08-29 NOTE — Progress Notes (Addendum)
[facility] - Clinic Note  09/01/2021     CHIEF COMPLAINT Patient presents for Retina Follow Up    HISTORY OF PRESENT ILLNESS: Kaitlyn Parks is a 87 y.o. female who presents to the clinic today for:   HPI     Retina Follow Up   Patient presents with  Wet AMD.  In both eyes.  This started years ago.  Severity is moderate.  Duration of 7 weeks.  Since onset it is gradually worsening.  I, the attending physician,  performed the HPI with the patient and updated documentation appropriately.        Comments   87 y/o female pt here for 7 wk f/u for exu ARMD OU.  Feels DVA OU is gradually worsening.  Denies pain, FOL, floaters.  AT prn OU.      Last edited by Rennis Chris, MD on 09/01/2021  2:03 PM.    Pt thinks her distance vision is not as good as it used to be, but she states she is still able to drive at night  Referring physician: Wynn Banker, MD [address] [place],  [place] [address]  HISTORICAL INFORMATION:  Selected notes from the MEDICAL RECORD NUMBER Referred by Dr. [place] DeMarco for concern of exu ARMD LEE: 11.26.19 (J. 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 (Ophthalmic Drugs)   No current facility-administered medications for this visit. (Ophthalmic Drugs)   Current Outpatient Medications (Other)  Medication Sig   aspirin EC 81 MG tablet Take 81 mg by mouth daily.   Biotin w/ Vitamins C & E (HAIR/SKIN/NAILS PO) Take 1 tablet by mouth daily.   Cranberry 400 MG CAPS Take 400 mg by mouth daily.   hydrALAZINE (APRESOLINE) 10 MG tablet 10 mg twice a day, may take extra dose for SBP >160 up to a total of 4 times a day   isosorbide mononitrate (IMDUR) 60 MG 24 hr tablet TAKE 1 & 1/2 TABLETS A DAY.   Multiple Vitamins-Calcium (ONE-A-DAY WOMENS PO) Take 1 tablet by mouth  daily.    Multiple Vitamins-Minerals (PRESERVISION AREDS 2 PO) Take 1 capsule by mouth 2 (two) times daily.   nitrofurantoin, macrocrystal-monohydrate, (MACROBID) 100 MG capsule Take by mouth. (Patient not taking: Reported on 08/13/2021)   nitroGLYCERIN (NITROSTAT) 0.4 MG SL tablet DISSOLVE 1 TABLET UNDER TONGUE IF NEEDED FOR CHEST PAIN. MAY REPEAT IN 5 MINUTES FOR 3 DOSES. (Patient not taking: Reported on 08/25/2021)   PRESCRIPTION MEDICATION Avastin eye injections given by Dr Vanessa Barbara due to macular degeneration   rosuvastatin (CRESTOR) 10 MG tablet TAKE (1/2) TABLET DAILY.   SYNTHROID 50 MCG tablet TAKE 1 TABLET EACH DAY.   No current facility-administered medications for this visit. (Other)   REVIEW OF SYSTEMS: ROS   Positive for: Gastrointestinal, Genitourinary, Musculoskeletal, Cardiovascular, Eyes Negative for: Constitutional, Neurological, Skin, HENT, Endocrine, Respiratory, Psychiatric, Allergic/Imm, Heme/Lymph Last edited by Celine Mans, COA on 09/01/2021  1:24 PM.      ALLERGIES Allergies  Allergen Reactions   Acyclovir And Related Other (See Comments)    unknown   Pravachol [Pravastatin Sodium] Other (See Comments)    cystitis   Sulfa Antibiotics     nausea   Zocor [Simvastatin] Other (See Comments)    cystitis    PAST MEDICAL HISTORY Past Medical History:  Diagnosis Date   Arthritis  DDD, scoliosis, sees Dr. Lovell Sheehan for this, uses norco very rarely for pain   Bradycardia 11/11/2017   CAD (coronary artery disease)    LAD stenting of a 90% lesion 2012   Cystocele    Educated about COVID-19 virus infection 12/06/2019   Elevated cholesterol    GERD (gastroesophageal reflux disease)    dx in work up 2016 for atypical CP at OSH   pt. denies   Heart murmur    Hypertensive retinopathy    OU   Hypothyroidism    Interstitial cystitis    sees Dr. [name]   Macular degeneration    OU   NSTEMI (non-ST elevated myocardial infarction) [facility])    Osteoporosis     Rectocele    S/P hip replacement, right 06/12/2017   Scoliosis    Thyroid disease    Hypothyroid   Urinary incontinence    USI   Uterine prolapse    Past Surgical History:  Procedure Laterality Date   BLADDER SUSPENSION  2011   CATARACT EXTRACTION Bilateral 2015   Dr. Elmer Picker   CORONARY ANGIOPLASTY WITH STENT PLACEMENT  04/21/2010   LAD 80%, RCA 30%, nl EF, s/p DES LAD   EYE SURGERY     LEFT HEART CATH AND CORONARY ANGIOGRAPHY N/A 06/14/2017   Procedure: LEFT HEART CATH AND CORONARY ANGIOGRAPHY;  Surgeon: Runell Gess, MD;  Location: [facility] INVASIVE CV LAB;  Service: Cardiovascular;  Laterality: N/A;   OOPHORECTOMY  2011   BSO   TOTAL HIP ARTHROPLASTY Right 06/10/2017   Procedure: RIGHT TOTAL HIP ARTHROPLASTY ANTERIOR APPROACH;  Surgeon: Samson Frederic, MD;  Location: [facility] ORS;  Service: Orthopedics;  Laterality: Right;  Needs RNFA   VAGINAL HYSTERECTOMY  2011   LAVH BSO; benign    FAMILY HISTORY Family History  Problem Relation Age of Onset   Hypertension Mother    Heart disease Mother    Heart disease Father    Heart disease Sister    Diabetes Sister    Uterine cancer Sister        mets to lungs   Lung cancer Sister    Scoliosis Sister    Heart disease Brother    Lung cancer Paternal Grandfather        smoker   Arthritis Daughter    Colon cancer Paternal Aunt 69   Breast cancer Paternal Aunt        Age 60's   Breast cancer Paternal Aunt    Leukemia Paternal Aunt    Breast cancer Cousin        Maternal 1st cousins-Age 28's   SOCIAL HISTORY Social History   Tobacco Use   Smoking status: Never   Smokeless tobacco: Never  Vaping Use   Vaping Use: Never used  Substance Use Topics   Alcohol use: No    Comment: Rare   Drug use: No       OPHTHALMIC EXAM: Base Eye Exam     Visual Acuity (Snellen - Linear)       Right Left   Dist [place] 20/25 -2 20/30 -2   Dist ph [place] NI NI         Tonometry (Tonopen, 1:26 PM)       Right Left   Pressure 13 15          Pupils       Dark Light Shape React APD   Right 3 2 Round Brisk None   Left 3 2 Round Brisk None  Visual Fields (Counting fingers)       Left Right    Full Full         Extraocular Movement       Right Left    Full, Ortho Full, Ortho         Neuro/Psych     Oriented x3: Yes   Mood/Affect: Normal         Dilation     Both eyes: 1.0% Mydriacyl, 2.5% Phenylephrine @ 1:26 PM           Slit Lamp and Fundus Exam     Slit Lamp Exam       Right Left   Lids/Lashes mild Telangiectasia, marginal leasion nasal UL Telangiectasia, Meibomian gland dysfunction, lower lid edema   Conjunctiva/Sclera White and quiet White and quiet, inferior conj chalasis   Cornea Arcus, 2+ Punctate epithelial erosions 3+ inferior punctate epithelial erosions, Arcus   Anterior Chamber Deep and clear Deep and clear   Iris Round and dilated Round and well dilated   Lens PC IOL in good position, trace Posterior capsular opacification (linear, extending just to visual axis) PC IOL in excellent position   Anterior Vitreous Vitreous syneresis, Posterior vitreous detachment Vitreous syneresis, Posterior vitreous detachment, vitreous condensations         Fundus Exam       Right Left   Disc Pink and Sharp, Compact, focal PPP Pink and Sharp, mild temporal PPA   C/D Ratio 0.2 0.3   Macula Blunted foveal reflex, Drusen, RPE mottling and clumping, early Atrophy, +PEDs -- stably improved, stable improvement in central cyst / IRF, No heme Blunted foveal reflex, +drusen, pigment clumping, RPE mottling, clumping and early atrophy, no heme, central PED / CNV with overlying cystic changes -- stably improved   Vessels Vascular attenuation Vascular attenuation   Periphery Attached, scattered reticular degeneration   Attached, scattered reticular degeneration            IMAGING AND PROCEDURES  Imaging and Procedures for @TODAY @  OCT, Retina - OU - Both Eyes       Right Eye Quality  was good. Central Foveal Thickness: 240. Progression has been stable. Findings include retinal drusen , pigment epithelial detachment, outer retinal atrophy, intraretinal hyper-reflective material, no SRF, no IRF, abnormal foveal contour (Stable improvement in IRF/cystic changes, +punctate IRHM - improved).   Left Eye Quality was good. Central Foveal Thickness: 236. Progression has been stable. Findings include retinal drusen , outer retinal atrophy, pigment epithelial detachment, intraretinal hyper-reflective material, normal foveal contour, no SRF, no IRF (stable improvement in IRF/cystic changes ).   Notes *Images captured and stored on drive  Diagnosis / Impression:  OD: exudative ARMD -- Stable improvement in IRF/cystic changes, +punctate IRHM -- improved OS: exu-ARMD -- stable improvement in IRF/cystic changes   Clinical management:  See below  Abbreviations: NFP - Normal foveal profile. CME - cystoid macular edema. PED - pigment epithelial detachment. IRF - intraretinal fluid. SRF - subretinal fluid. EZ - ellipsoid zone. ERM - epiretinal membrane. ORA - outer retinal atrophy. ORT - outer retinal tubulation. SRHM - subretinal hyper-reflective material      Intravitreal Injection, Pharmacologic Agent - OS - Left Eye       Time Out 09/01/2021. 2:02 PM. Confirmed correct patient, procedure, site, and patient consented.   Anesthesia Topical anesthesia was used. Anesthetic medications included Lidocaine 2%, Proparacaine 0.5%.   Procedure Preparation included 5% betadine to ocular surface, eyelid speculum. A (32g) needle was used.  Injection: 1.25 mg Bevacizumab 1.25mg /0.2105ml   Route: Intravitreal, Site: Left Eye   NDC: [id number], Lot: [id number], Expiration date: 10/13/2021, Waste: 0.05 mL   Post-op Post injection exam found visual acuity of at least counting fingers. The patient tolerated the procedure well. There were no complications. The patient received written and verbal  post procedure care education. Post injection medications were not given.            ASSESSMENT/PLAN:    ICD-10-CM   1. Exudative age-related macular degeneration of left eye with active choroidal neovascularization (HCC)  H35.3221 OCT, Retina - OU - Both Eyes    Intravitreal Injection, Pharmacologic Agent - OS - Left Eye    Bevacizumab (AVASTIN) SOLN 1.25 mg    CANCELED: Intravitreal Injection, Pharmacologic Agent - OD - Right Eye    2. Exudative age-related macular degeneration of right eye with active choroidal neovascularization (HCC)  H35.3211     3. Essential hypertension  I10     4. Hypertensive retinopathy of both eyes  H35.033     5. Pseudophakia of both eyes  Z96.1     6. Dry eyes  H04.123      1. Exudative age-related macular degeneration, left eye.    - OCT 4.30.2020 showed interval conversion of OS from nonexudative ARMD to exu-ARMD with large dome-shaped PED with overlying IRF/SRF  - FA 05.28.20 -- no active CNV OS, just staining  - s/p IVA OS #1 (04.30.20), #2 (05.28.20), #3 (06.26.20), #4 (08.03.20), #5 (09.11.20), #6 (10.19.20), #7 (11.23.20),  - reactivation of CNV noted on 05.05.22 -- s/p IVA OS  #8 (05.05.22), #9 (06.06.22), #10 (07.19.22), #11 (09.13.22), #12 (10.25.22), #13 (12.13.22)  - **Interval increase in IRF overlying PED at 8 wks on 09.13.22 visit**  - BCVA improved back to 20/25 from 20/30 today  - OCT shows OD: stable improvement in IRF/cystic changes at 7 wks  - recommend IVA OS #14 today, 01.30.23 with f/u at 7 weeks  - pt wishes to proceed with injection  - RBA of procedure discussed, questions answered  - informed consent obtained and signed  - see procedure note  - benefits investigation for Eylea initiated 4.30.2020 -- approved for 2022  - f/u in 7 wks -- DFE/OCT, possible injection  2. Age related macular degeneration, exudative, OD  - interval development of IRF first noted on 09.11.20 -- conversion from nonexudative to exudative  ARMD  - pt initially presented on 11.27.19 due to alert from [facility] home monitoring system for OD  - [facility] prescribed by Dr. Elmer PickerHecker  - FA (5.28.2020) showed staining / window defect corresponding temporal RPE changes OU -- no active CNV OU  - S/P IVA OD #1 (09.11.20), #2 (10.19.20), #3 (11.23.20), #4 (01.07.21), #5 (2.19.21), #6 (04.02.21), #7 (05.28.21), #8 (07.23.21), #9 (09.24.21), #10 (11.29.21), #11 (02.11.22), #12 (05.05.22), #13 (7.19.22), #14 (09.13.22)  - benefits investigation for Eylea initiated 4.30.2020 -- approved for 2022  - OCT shows OD: Stable improvement in IRF/cystic changes  - BCVA stable at 20/30   - will hold IVA OD again today -- will treat prn  - f/u 7 weeks DFE, OCT as above  3,4. Hypertensive retinopathy OU  - discussed importance of tight BP control  - monitor  5. Pseudophakia OU  - s/p CE/IOL OU  - beautiful surgeries, doing well  - monitor  6. Dry eyes OU - recommend artificial tears and lubricating ointment as needed  Ophthalmic Meds Ordered this visit:  Meds ordered this encounter  Medications  Bevacizumab (AVASTIN) SOLN 1.25 mg     Return in about 7 weeks (around 10/20/2021) for f/u exu ARMD OS, DFE, OCT.  There are no Patient Instructions on file for this visit.  This document serves as a record of services personally performed by Karie ChimeraBrian G. Roben Tatsch, MD, PhD. It was created on their behalf by Annalee Gentaaryl Barber, COMT. The creation of this record is the provider's dictation and/or activities during the visit.  Electronically signed by: Annalee Gentaaryl Barber, COMT 09/02/21 12:24 PM  This document serves as a record of services personally performed by Karie ChimeraBrian G. Mazi Brailsford, MD, PhD. It was created on their behalf by Glee ArvinAmanda J. Manson PasseyBrown, OA an ophthalmic technician. The creation of this record is the provider's dictation and/or activities during the visit.    Electronically signed by: Glee ArvinAmanda J. Manson PasseyBrown, [place] 01.30.2023 12:24 PM   Karie ChimeraBrian G. Avilene Marrin, M.D., Ph.D. Diseases &  Surgery of the Retina and Vitreous [facility]  I have reviewed the above documentation for accuracy and completeness, and I agree with the above. Karie ChimeraBrian G. Rayma Hegg, M.D., Ph.D. 09/02/21 12:31 PM    Abbreviations: M myopia (nearsighted); A astigmatism; H hyperopia (farsighted); P presbyopia; Mrx spectacle prescription;  CTL contact lenses; OD right eye; OS left eye; OU both eyes  XT exotropia; ET esotropia; PEK punctate epithelial keratitis; PEE punctate epithelial erosions; DES dry eye syndrome; MGD meibomian gland dysfunction; ATs artificial tears; PFAT's preservative free artificial tears; NSC nuclear sclerotic cataract; PSC posterior subcapsular cataract; ERM epi-retinal membrane; PVD posterior vitreous detachment; RD retinal detachment; DM diabetes mellitus; DR diabetic retinopathy; NPDR non-proliferative diabetic retinopathy; PDR proliferative diabetic retinopathy; CSME clinically significant macular edema; DME diabetic macular edema; dbh dot blot hemorrhages; CWS cotton wool spot; POAG primary open angle glaucoma; C/D cup-to-disc ratio; HVF humphrey visual field; GVF goldmann visual field; OCT optical coherence tomography; IOP intraocular pressure; BRVO Branch retinal vein occlusion; CRVO central retinal vein occlusion; CRAO central retinal artery occlusion; BRAO branch retinal artery occlusion; RT retinal tear; SB scleral buckle; PPV pars plana vitrectomy; VH Vitreous hemorrhage; PRP panretinal laser photocoagulation; IVK intravitreal kenalog; VMT vitreomacular traction; MH Macular hole;  NVD neovascularization of the disc; NVE neovascularization elsewhere; AREDS age related eye disease study; ARMD age related macular degeneration; POAG primary open angle glaucoma; EBMD epithelial/anterior basement membrane dystrophy; ACIOL anterior chamber intraocular lens; IOL intraocular lens; PCIOL posterior chamber intraocular lens; Phaco/IOL phacoemulsification with intraocular lens  placement; PRK photorefractive keratectomy; LASIK laser assisted in situ keratomileusis; HTN hypertension; DM diabetes mellitus; COPD chronic obstructive pulmonary disease

## 2021-09-01 ENCOUNTER — Other Ambulatory Visit: Payer: Self-pay

## 2021-09-01 ENCOUNTER — Ambulatory Visit (INDEPENDENT_AMBULATORY_CARE_PROVIDER_SITE_OTHER): Payer: Medicare Other | Admitting: Ophthalmology

## 2021-09-01 ENCOUNTER — Encounter (INDEPENDENT_AMBULATORY_CARE_PROVIDER_SITE_OTHER): Payer: Self-pay | Admitting: Ophthalmology

## 2021-09-01 DIAGNOSIS — H35033 Hypertensive retinopathy, bilateral: Secondary | ICD-10-CM | POA: Diagnosis not present

## 2021-09-01 DIAGNOSIS — H353211 Exudative age-related macular degeneration, right eye, with active choroidal neovascularization: Secondary | ICD-10-CM

## 2021-09-01 DIAGNOSIS — H353231 Exudative age-related macular degeneration, bilateral, with active choroidal neovascularization: Secondary | ICD-10-CM

## 2021-09-01 DIAGNOSIS — Z961 Presence of intraocular lens: Secondary | ICD-10-CM

## 2021-09-01 DIAGNOSIS — I10 Essential (primary) hypertension: Secondary | ICD-10-CM | POA: Diagnosis not present

## 2021-09-01 DIAGNOSIS — H353221 Exudative age-related macular degeneration, left eye, with active choroidal neovascularization: Secondary | ICD-10-CM

## 2021-09-01 DIAGNOSIS — H04123 Dry eye syndrome of bilateral lacrimal glands: Secondary | ICD-10-CM

## 2021-09-02 DIAGNOSIS — H353231 Exudative age-related macular degeneration, bilateral, with active choroidal neovascularization: Secondary | ICD-10-CM | POA: Diagnosis not present

## 2021-09-02 MED ORDER — BEVACIZUMAB CHEMO INJECTION 1.25MG/0.05ML SYRINGE FOR KALEIDOSCOPE
1.2500 mg | INTRAVITREAL | Status: AC | PRN
  Administered 2021-09-02: 1.25 mg via INTRAVITREAL

## 2021-09-08 ENCOUNTER — Ambulatory Visit (INDEPENDENT_AMBULATORY_CARE_PROVIDER_SITE_OTHER): Payer: Medicare Other

## 2021-09-08 ENCOUNTER — Other Ambulatory Visit: Payer: Self-pay

## 2021-09-08 ENCOUNTER — Other Ambulatory Visit: Payer: Medicare Other

## 2021-09-08 ENCOUNTER — Telehealth: Payer: Self-pay | Admitting: Physician Assistant

## 2021-09-08 DIAGNOSIS — R109 Unspecified abdominal pain: Secondary | ICD-10-CM | POA: Diagnosis not present

## 2021-09-08 MED ORDER — DICYCLOMINE HCL 10 MG PO CAPS: 10.0000 mg | ORAL_CAPSULE | Freq: Two times a day (BID) | ORAL | 0 refills | Status: DC | PRN

## 2021-09-08 MED ORDER — FAMOTIDINE 20 MG PO TABS: 20.0000 mg | ORAL_TABLET | Freq: Every day | ORAL | 0 refills | Status: DC

## 2021-09-08 NOTE — Telephone Encounter (Signed)
Can she take a Pepcid 20mg  qd for a week to see if this helps. (Can't remember if there is a reason she can't).  Otherwise could offer Dicyclomine 10mg  q12h prn for pain #20   If not amenable to above she needs follow up in clinic.   Thanks-JLL

## 2021-09-08 NOTE — Telephone Encounter (Signed)
Message from [facility] CHART:  "Significant stomach pain and cramps increasing since Friday Feb 3.  I noted a stomach exray was ordered at my last appointment.   I have not been contacted about this x-ray."

## 2021-09-08 NOTE — Telephone Encounter (Signed)
Lm on vm for patient to return call. X-ray was ordered by patient's PCP.

## 2021-09-08 NOTE — Telephone Encounter (Signed)
Pt returned call. She reports that she has been having stomach cramps since last Friday. Pt reports that when symptoms started she took Pepto-Bismol. Pt reports that she ate salad last night and had a burning pain in her stomach all night. She states that she has been having trouble eating. Her PCP previously told her to take Prelief, OTC antacid and children's probiotics. She states that any probiotics make her "lose her bowels". Pt reports that she had a black stool last night, she thinks it is from the Pepto that she took last week. Pt reports a BM this morning that was loose. She denies any BRB. The x-ray order in epic was ordered by her PCP. Please advise, thanks.

## 2021-09-08 NOTE — Telephone Encounter (Signed)
Returned call to patient. We reviewed Kaitlyn Parks's recommendations. She stated that she did not remember trying Pepcid, but will start now. She is aware that I have sent an RX for Dicyclomine for her to take PRN for abdominal pain. She reports that her x-ray is scheduled for this afternoon. I told the pt that she could have her PCP send results to [facility] to review. Pt knows to contact us to set up an appt if no improvement in her symptoms after 1-week. Pt verbalized understanding and had no concerns at the end of the call.

## 2021-09-12 ENCOUNTER — Other Ambulatory Visit: Payer: Medicare Other

## 2021-09-12 ENCOUNTER — Encounter: Payer: Self-pay | Admitting: Physician Assistant

## 2021-09-12 ENCOUNTER — Ambulatory Visit: Payer: Medicare Other | Admitting: Physician Assistant

## 2021-09-12 VITALS — BP 132/70 | HR 78 | Ht 62.5 in | Wt 122.6 lb

## 2021-09-12 DIAGNOSIS — R1084 Generalized abdominal pain: Secondary | ICD-10-CM | POA: Diagnosis not present

## 2021-09-12 DIAGNOSIS — R197 Diarrhea, unspecified: Secondary | ICD-10-CM

## 2021-09-12 DIAGNOSIS — R159 Full incontinence of feces: Secondary | ICD-10-CM | POA: Diagnosis not present

## 2021-09-12 MED ORDER — DICYCLOMINE HCL 10 MG PO CAPS: 10.0000 mg | ORAL_CAPSULE | Freq: Four times a day (QID) | ORAL | 5 refills | Status: DC

## 2021-09-12 NOTE — Progress Notes (Signed)
Chief Complaint: Follow-up abdominal pain and diarrhea  HPI:    Kaitlyn Parks is an 87 year old Caucasian female, known to Dr. [name], with a past medical history of CAD, cystocele, NSTEMI and multiple others, who returns to clinic today for follow-up of her abdominal pain and diarrhea.    08/18/2016 patient seen in clinic by me and discussed Cologuard.  Cologuard returned - 09/04/2016 was discussed patient had no further screening for colon cancer.    08/13/2021 CBC and CMP normal.  She was seen in clinic by her PCP at that time and discussed that since mid December after eating beef tips at a luncheon she had gotten very ill with vomiting and had not felt well since.  Describes no appetite, dizziness and stomach burning.  Apparently had Salmonella before that visit and it made her sick and was painful, Gas-X and Pepto-Bismol helped.  She remained nauseous since that time.  We discussed 2 loose bowel movements a day.  Discussed that antinausea medication was limited secondary to prolonged QT and she was told to continue ginger ale.  It was recommended she avoid dairy and she had labs and stool studies.  An abdominal x-ray was ordered.    08/15/2021 patient had a fecal occult blood study which was positive.  Other fecal results including fecal leukocytes, C. difficile, stool culture all negative.    08/25/2021 patient seen in clinic and was feeling mostly better.  She had had some nausea that day.  Otherwise her bowel habits are back to normal.  At that time suggested she restart her children's probiotic (adult ones give her diarrhea) and take daily for the next couple of months.  If symptoms came back or she saw any further blood then to let us know.    09/08/2021 patient describes significant stomach pain and cramping since 2/3.  When symptoms started she took Pepto-Bismol.  Described black stool at night.  Also some looser than normal bowel movements.  Recommended she take Pepcid 20 mg daily for a week to see if  this helped.  Also prescribe some Dicyclomine as needed for abdominal pain.    09/09/2021 abdominal x-ray with mild retained fecal matter and no obstructive changes.    Today, the patient tells me that over the past week or so her symptoms have seemed to come back, but they seem slightly different this time.  She is having urgent diarrhea and sometimes incontinence like she did in the lobby just prior to coming in, apparently had to run home and change pants.  Tells me she is having liquid stool at least 3-4 times a day.  This is typically after generalized abdominal cramping pain.  Since starting the Dicyclomine 10 mg twice a day she has had 3 decreased abdominal pain and since using the Pepcid 20 mg twice daily her nausea is better.  Her most concerning symptom is this diarrhea which is starting to hinder her life.  Does tell me that her diet is very limited she is avoiding all dairy and really trying to eat hardly anything but still has diarrhea.    Denies fever, chills or blood in her stool.  Past Medical History:  Diagnosis Date   Arthritis    DDD, scoliosis, sees Dr. Lovell Sheehan for this, uses norco very rarely for pain   Bradycardia 11/11/2017   CAD (coronary artery disease)    LAD stenting of a 90% lesion 2012   Cystocele    Educated about COVID-19 virus infection 12/06/2019  Elevated cholesterol    GERD (gastroesophageal reflux disease)    dx in work up 2016 for atypical CP at OSH   pt. denies   Heart murmur    Hypertensive retinopathy    OU   Hypothyroidism    Interstitial cystitis    sees Dr. [name]   Macular degeneration    OU   NSTEMI (non-ST elevated myocardial infarction) [facility])    Osteoporosis    Rectocele    S/P hip replacement, right 06/12/2017   Scoliosis    Thyroid disease    Hypothyroid   Urinary incontinence    USI   Uterine prolapse     Past Surgical History:  Procedure Laterality Date   BLADDER SUSPENSION  2011   CATARACT EXTRACTION Bilateral 2015   Dr. Elmer Picker    CORONARY ANGIOPLASTY WITH STENT PLACEMENT  04/21/2010   LAD 80%, RCA 30%, nl EF, s/p DES LAD   EYE SURGERY     LEFT HEART CATH AND CORONARY ANGIOGRAPHY N/A 06/14/2017   Procedure: LEFT HEART CATH AND CORONARY ANGIOGRAPHY;  Surgeon: Runell Gess, MD;  Location: [facility];  Service: Cardiovascular;  Laterality: N/A;   OOPHORECTOMY  2011   BSO   TOTAL HIP ARTHROPLASTY Right 06/10/2017   Procedure: RIGHT TOTAL HIP ARTHROPLASTY ANTERIOR APPROACH;  Surgeon: Samson Frederic, MD;  Location: [facility];  Service: Orthopedics;  Laterality: Right;  Needs RNFA   VAGINAL HYSTERECTOMY  2011   [facility] BSO; benign    Current Outpatient Medications  Medication Sig Dispense Refill   aspirin EC 81 MG tablet Take 81 mg by mouth daily.     Biotin w/ Vitamins C & E (HAIR/SKIN/NAILS PO) Take 1 tablet by mouth daily.     Cranberry 400 MG CAPS Take 400 mg by mouth daily.     dicyclomine (BENTYL) 10 MG capsule Take 1 capsule (10 mg total) by mouth every 12 (twelve) hours as needed (abdominal pain). 20 capsule 0   famotidine (PEPCID) 20 MG tablet Take 1 tablet (20 mg total) by mouth daily. 30 tablet 0   hydrALAZINE (APRESOLINE) 10 MG tablet 10 mg twice a day, may take extra dose for SBP >160 up to a total of 4 times a day 180 tablet 3   isosorbide mononitrate (IMDUR) 60 MG 24 hr tablet TAKE 1 & 1/2 TABLETS A DAY. 135 tablet 1   Multiple Vitamins-Calcium (ONE-A-DAY WOMENS PO) Take 1 tablet by mouth daily.      Multiple Vitamins-Minerals (PRESERVISION AREDS 2 PO) Take 1 capsule by mouth 2 (two) times daily.     nitrofurantoin, macrocrystal-monohydrate, (MACROBID) 100 MG capsule Take by mouth. (Patient not taking: Reported on 08/13/2021)     nitroGLYCERIN (NITROSTAT) 0.4 MG SL tablet DISSOLVE 1 TABLET UNDER TONGUE IF NEEDED FOR CHEST PAIN. MAY REPEAT IN 5 MINUTES FOR 3 DOSES. (Patient not taking: Reported on 08/25/2021) 25 tablet 0   PRESCRIPTION MEDICATION Avastin eye injections given by Dr Vanessa Barbara due to macular  degeneration     rosuvastatin (CRESTOR) 10 MG tablet TAKE (1/2) TABLET DAILY. 45 tablet 8   SYNTHROID 50 MCG tablet TAKE 1 TABLET EACH DAY. 90 tablet 1   No current facility-administered medications for this visit.    Allergies as of 09/12/2021 - Review Complete 09/01/2021  Allergen Reaction Noted   Acyclovir and related Other (See Comments) 06/04/2017   Pravachol [pravastatin sodium] Other (See Comments) 05/30/2012   Sulfa antibiotics  09/15/2020   Zocor [simvastatin] Other (See Comments) 05/30/2012  Family History  Problem Relation Age of Onset   Hypertension Mother    Heart disease Mother    Heart disease Father    Heart disease Sister    Diabetes Sister    Uterine cancer Sister        mets to lungs   Lung cancer Sister    Scoliosis Sister    Heart disease Brother    Lung cancer Paternal Grandfather        smoker   Arthritis Daughter    Colon cancer Paternal Aunt 85   Breast cancer Paternal Aunt        Age 39's   Breast cancer Paternal Aunt    Leukemia Paternal Aunt    Breast cancer Cousin        Maternal 1st cousins-Age 16's    Social History   Socioeconomic History   Marital status: Widowed    Spouse name: Not on file   Number of children: 2   Years of education: Not on file   Highest education level: Not on file  Occupational History   Occupation: retired  Tobacco Use   Smoking status: Never   Smokeless tobacco: Never  Vaping Use   Vaping Use: Never used  Substance and Sexual Activity   Alcohol use: No    Comment: Rare   Drug use: No   Sexual activity: Never    Birth control/protection: Surgical  Other Topics Concern   Not on file  Social History Narrative   Work or School:  none      Home Situation: lives with husband      Spiritual Beliefs: [employer]      Lifestyle: regular exercise (yoga, water aerobics); diet is healthy      Social Determinants of Corporate investment banker Strain: Not on file  Food Insecurity: Not on file   Transportation Needs: Not on file  Physical Activity: Not on file  Stress: Not on file  Social Connections: Not on file  Intimate Partner Violence: Not At Risk   Fear of Current or Ex-Partner: No   Emotionally Abused: No   Physically Abused: No   Sexually Abused: No    Review of Systems:    Constitutional: No weight loss, fever or chills Cardiovascular: No chest pain Respiratory: No SOB  Gastrointestinal: See HPI and otherwise negative   Physical Exam:  Vital signs: BP 132/70    Pulse 78    Ht 5' 2.5" (1.588 m)    Wt 122 lb 9.6 oz (55.6 kg)    SpO2 95%    BMI 22.07 kg/m    Constitutional:   Pleasant Elderly Caucasian female appears to be in NAD, Well developed, Well nourished, alert and cooperative Respiratory: Respirations even and unlabored. Lungs clear to auscultation bilaterally.   No wheezes, crackles, or rhonchi.  Cardiovascular: Normal S1, S2. No MRG. Regular rate and rhythm. No peripheral edema, cyanosis or pallor.  Gastrointestinal:  Soft, nondistended, mild generalized ttp. No rebound or guarding. Normal bowel sounds. No appreciable masses or hepatomegaly. Rectal:  Not performed.  Psychiatric:  Demonstrates good judgement and reason without abnormal affect or behaviors.  RELEVANT LABS AND IMAGING: CBC    Component Value Date/Time   WBC 5.6 08/13/2021 1145   RBC 4.27 08/13/2021 1145   HGB 12.7 08/13/2021 1145   HCT 39.4 08/13/2021 1145   PLT 261.0 08/13/2021 1145   MCV 92.2 08/13/2021 1145   MCH 30.2 03/04/2020 0828   MCHC 32.2 08/13/2021 1145   RDW  13.1 08/13/2021 1145   LYMPHSABS 0.8 08/13/2021 1145   MONOABS 0.7 08/13/2021 1145   EOSABS 0.1 08/13/2021 1145   BASOSABS 0.0 08/13/2021 1145    CMP     Component Value Date/Time   NA 138 08/13/2021 1145   K 3.6 08/13/2021 1145   CL 101 08/13/2021 1145   CO2 31 08/13/2021 1145   GLUCOSE 81 08/13/2021 1145   BUN 18 08/13/2021 1145   CREATININE 0.71 08/13/2021 1145   CREATININE 0.77 03/04/2020 0828    CALCIUM 8.9 08/13/2021 1145   CALCIUM 9.0 07/01/2010 2255   PROT 6.4 08/13/2021 1145   PROT 6.7 02/05/2017 0834   ALBUMIN 3.8 08/13/2021 1145   ALBUMIN 4.4 02/05/2017 0834   AST 15 08/13/2021 1145   ALT 12 08/13/2021 1145   ALKPHOS 81 08/13/2021 1145   BILITOT 0.4 08/13/2021 1145   BILITOT 0.3 02/05/2017 0834   GFRNONAA >60 10/09/2018 0928   GFRAA >60 10/09/2018 0928    Assessment: 1.  Diarrhea: With urgency and some incontinence worse over the past week, different than diarrhea in January which had resolved; consider infectious cause versus IBS versus inflammatory 2.  Abdominal pain: Generalized cramping, typically before a bowel movement, some better with Dicyclomine; consider IBS in relation to above 3.  Nausea and indigestion: Better with Pepcid 20 mg twice daily  Plan: 1.  Patient describes diarrhea as being different this time.  We will reorder stool studies including a GI pathogen panel, O&P, fecal lactoferrin and calprotectin as well as fecal pancreatic elastase. 2.  Discussed the possibility of IBS given abdominal cramping.  Recommend she increase the Dicyclomine to 10 mg 4 times daily, 20 to 30 minutes before meals and at bedtime.  Prescribed #120 with 1 refill. 3.  Continue Pepcid 20 mg twice daily as this seems to be helping with nausea. 4.  If stool studies are negative and symptoms continue would recommend a CT of the abdomen and pelvis with contrast.  She may need update of labs at that point to check creatinine. 5.  Recommend the patient use Imodium as needed for diarrhea, especially if she knows she is going somewhere 6.  Patient to follow in clinic for recommendations after above.  Kaitlyn MeekerJennifer Olson Lucarelli, PA-C [facility] 09/12/2021, 2:45 PM  Cc: Wynn BankerKoberlein, Junell C, MD

## 2021-09-12 NOTE — Patient Instructions (Addendum)
LABS:   Please proceed to the basement level for lab work/ stool tests before leaving today. Press "B" on the elevator. The lab is located at the first door on the left as you exit the elevator.  HEALTHCARE LAWS AND MY CHART RESULTS:   Due to recent changes in healthcare laws, you may see results of your imaging and/or laboratory studies on MyChart before I have had a chance to review them.  I understand that in some cases there may be results that are confusing or concerning to you. Please understand that not all results are received at the same time and often I may need to interpret multiple results in order to provide you with the best plan of care or course of treatment. Therefore, I ask that you please give me 48 hours to thoroughly review all your results before contacting my office for clarification.   MEDICATION: We have sent the following medication to your pharmacy for you to pick up at your convenience: Dicyclomine 10 MG tablet, take 4 times a day as needed.   Please continue Pepcid twice a day.  BMI:  If you are age 49 or older, your body mass index should be between 23-30. Your Body mass index is 22.07 kg/m. If this is out of the aforementioned range listed, please consider follow up with your Primary Care Provider.  If you are age 35 or younger, your body mass index should be between 19-25. Your Body mass index is 22.07 kg/m. If this is out of the aformentioned range listed, please consider follow up with your Primary Care Provider.   MY CHART:  The [facility] providers would like to encourage you to use [facility] to communicate with providers for non-urgent requests or questions.  Due to long hold times on the telephone, sending your provider a message by [facility] may be a faster and more efficient way to get a response.  Please allow 48 business hours for a response.  Please remember that this is for non-urgent requests.   Thank you for trusting me with your gastrointestinal  care!    Hyacinth Meeker, [place]

## 2021-09-13 NOTE — Progress Notes (Signed)
Noted  

## 2021-09-15 ENCOUNTER — Other Ambulatory Visit: Payer: Medicare Other

## 2021-09-15 DIAGNOSIS — R159 Full incontinence of feces: Secondary | ICD-10-CM

## 2021-09-15 DIAGNOSIS — R1084 Generalized abdominal pain: Secondary | ICD-10-CM

## 2021-09-15 DIAGNOSIS — R197 Diarrhea, unspecified: Secondary | ICD-10-CM

## 2021-09-17 ENCOUNTER — Encounter: Payer: Self-pay | Admitting: Family Medicine

## 2021-09-17 ENCOUNTER — Encounter: Payer: Medicare Other | Admitting: Family Medicine

## 2021-09-17 LAB — GI PROFILE, STOOL, PCR
Adenovirus F 40/41: NOT DETECTED
Astrovirus: NOT DETECTED
C difficile toxin A/B: NOT DETECTED
Campylobacter: NOT DETECTED
Cryptosporidium: NOT DETECTED
Cyclospora cayetanensis: NOT DETECTED
Entamoeba histolytica: NOT DETECTED
Enteroaggregative E coli: NOT DETECTED
Enteropathogenic E coli: NOT DETECTED
Enterotoxigenic E coli: DETECTED — AB
Giardia lamblia: NOT DETECTED
Norovirus GI/GII: NOT DETECTED
Plesiomonas shigelloides: NOT DETECTED
Rotavirus A: NOT DETECTED
Salmonella: NOT DETECTED
Sapovirus: NOT DETECTED
Shiga-toxin-producing E coli: NOT DETECTED
Shigella/Enteroinvasive E coli: NOT DETECTED
Vibrio cholerae: NOT DETECTED
Vibrio: NOT DETECTED
Yersinia enterocolitica: NOT DETECTED

## 2021-09-17 LAB — CALPROTECTIN, FECAL: Calprotectin, Fecal: 146 ug/g — ABNORMAL HIGH (ref 0–120)

## 2021-09-18 ENCOUNTER — Telehealth: Payer: Self-pay | Admitting: Physician Assistant

## 2021-09-18 NOTE — Telephone Encounter (Signed)
Called and spoke with patient regarding her results and treatment. Pt states that she received the results yesterday and did some research. She states that she feels like she is not being taken seriously. She states that she read that this particular E. Coli caused death in children in third world countries. Pt reports that she knows when she got it, she states that she got I back in December 2022 after eating catered food at an event. Pt states that she has been ill for the past couple of months. Pt confirmed that she went to her PCP first and the stool tests that they ordered were negative. She states that nothing was done at the time of her visit here in January and she is upset. I told her that hopefully she will start to feel better after treatment, she has been advised to call us back once she has completed the full course. Pt asked if she was going to see a doctor, It old her that Anderson Malta wanted her to call with an update. I told her that in the future if she is having GI symptoms then she should reach out to our office first so that we can triage her symptoms. Pt requested that prescription be sent to pharmacy on file. Pt verbalized understanding and had no concerns at the end of the call.  Anderson Malta, I am receiving this pop-up for Cipro. Pt is aware that we may have to send in an alternative treatment. Please advise, thanks.

## 2021-09-18 NOTE — Telephone Encounter (Signed)
[facility] called to advise an alert on results.

## 2021-09-18 NOTE — Telephone Encounter (Signed)
Lets give her some ciprofloxacin 500 mg twice daily x5 days.  Please have her call and follow-up with [place] after completing antibiotics.   Thanks, JL L

## 2021-09-18 NOTE — Telephone Encounter (Signed)
Check with ID.  Thanks

## 2021-09-18 NOTE — Telephone Encounter (Signed)
See 09/15/21 GI profile results.  Enterotoxigenic E coli Not Detected Detected - Abnormal

## 2021-09-23 ENCOUNTER — Telehealth: Payer: Self-pay

## 2021-09-23 LAB — FECAL LACTOFERRIN, QUANT
Fecal Lactoferrin: NEGATIVE
MICRO NUMBER:: 13000442
SPECIMEN QUALITY:: ADEQUATE

## 2021-09-23 LAB — OVA AND PARASITE EXAMINATION
CONCENTRATE RESULT:: NONE SEEN
MICRO NUMBER:: 13000223
SPECIMEN QUALITY:: ADEQUATE
TRICHROME RESULT:: NONE SEEN

## 2021-09-23 LAB — FECAL FAT, QUALITATIVE: FECAL FAT, QUALITATIVE: NORMAL

## 2021-09-23 LAB — PANCREATIC ELASTASE, FECAL: Pancreatic Elastase-1, Stool: >500 mcg/g

## 2021-09-23 MED ORDER — AZITHROMYCIN 500 MG PO TABS: 500.0000 mg | ORAL_TABLET | Freq: Every day | ORAL | 0 refills | Status: DC

## 2021-09-23 NOTE — Telephone Encounter (Signed)
This has been addressed. See patient message to PCP on 09/17/21 for details.

## 2021-09-23 NOTE — Telephone Encounter (Signed)
Received a vm from patient, she wanted to confirm that the Azithromycin would not cause any problems to her heart rhythm. I told her that Victorino Dike has checked with ID and they said that she should be fine to proceed. I also told her that her PCP and Victorino Dike reached out to Dr. Antoine Poche but did not receive a response. I told pt that if she does not feel comfortable starting the Azithromycin she could always reach out to Dr. Jenene Slicker office to discuss with his nurse. Pt states that she is going to go ahead and start the medication and will let us know if she has any side effects, she reports that she read that the antibiotic can cause diarrhea. I told pt that this is a short course of Azithromycin and she should see improvement in her symptoms. Pt knows to call us with an update once she has completed the medication. Pt verbalized understanding and had no concerns at the end of the call.

## 2021-09-23 NOTE — Telephone Encounter (Signed)
Attempted to reach pt on her home phone. I left her a detailed message letting her know that Victorino Dike spoke with the ID specialist at the hospital and they stated that the Azithromycin should be fine. I have sent this prescription to [facility]. I told pt to give [place] a call once she has completed the treatment and hopefully she will start to feel better soon. Pt has been advised to call us back or send a my chart message if she has any questions. I will send pt a my chart message as well.   RX for Azithromycin 500 mg daily for 3 days sent to [facility].

## 2021-09-24 ENCOUNTER — Ambulatory Visit: Payer: Medicare Other | Admitting: Gastroenterology

## 2021-09-26 NOTE — Telephone Encounter (Signed)
See alternate my chart message to patient.

## 2021-09-30 ENCOUNTER — Encounter (HOSPITAL_COMMUNITY): Payer: Self-pay | Admitting: Emergency Medicine

## 2021-09-30 ENCOUNTER — Emergency Department (HOSPITAL_COMMUNITY)
Admission: EM | Admit: 2021-09-30 | Discharge: 2021-09-30 | Disposition: A | Discharge status: Home / Self Care | Payer: Medicare Other | Attending: Emergency Medicine | Admitting: Emergency Medicine

## 2021-09-30 ENCOUNTER — Emergency Department (HOSPITAL_COMMUNITY): Payer: Medicare Other

## 2021-09-30 DIAGNOSIS — R0789 Other chest pain: Secondary | ICD-10-CM | POA: Insufficient documentation

## 2021-09-30 DIAGNOSIS — Z7982 Long term (current) use of aspirin: Secondary | ICD-10-CM | POA: Insufficient documentation

## 2021-09-30 DIAGNOSIS — I10 Essential (primary) hypertension: Secondary | ICD-10-CM | POA: Insufficient documentation

## 2021-09-30 DIAGNOSIS — I2510 Atherosclerotic heart disease of native coronary artery without angina pectoris: Secondary | ICD-10-CM | POA: Diagnosis not present

## 2021-09-30 DIAGNOSIS — R079 Chest pain, unspecified: Secondary | ICD-10-CM

## 2021-09-30 LAB — BASIC METABOLIC PANEL
Anion gap: 9 (ref 5–15)
BUN: 13 mg/dL (ref 8–23)
CO2: 26 mmol/L (ref 22–32)
Calcium: 8.8 mg/dL — ABNORMAL LOW (ref 8.9–10.3)
Chloride: 101 mmol/L (ref 98–111)
Creatinine, Ser: 0.71 mg/dL (ref 0.44–1.00)
GFR, Estimated: >60 mL/min (ref >60)
Glucose, Bld: 104 mg/dL — ABNORMAL HIGH (ref 70–99)
Potassium: 3.4 mmol/L — ABNORMAL LOW (ref 3.5–5.1)
Sodium: 136 mmol/L (ref 135–145)

## 2021-09-30 LAB — CBC
HCT: 38.7 % (ref 36.0–46.0)
Hemoglobin: 12.3 g/dL (ref 12.0–15.0)
MCH: 29.6 pg (ref 26.0–34.0)
MCHC: 31.8 g/dL (ref 30.0–36.0)
MCV: 93.3 fL (ref 80.0–100.0)
Platelets: 264 10*3/uL (ref 150–400)
RBC: 4.15 MIL/uL (ref 3.87–5.11)
RDW: 12.3 % (ref 11.5–15.5)
WBC: 4.3 10*3/uL (ref 4.0–10.5)
nRBC: 0 % (ref 0.0–0.2)

## 2021-09-30 LAB — TROPONIN I (HIGH SENSITIVITY)
Troponin I (High Sensitivity): 7 ng/L (ref <18)
Troponin I (High Sensitivity): 8 ng/L (ref <18)

## 2021-09-30 MED ORDER — NITROGLYCERIN 0.4 MG SL SUBL
0.4000 mg | SUBLINGUAL_TABLET | Freq: Once | SUBLINGUAL | Status: DC
  Filled 2021-09-30: qty 1

## 2021-09-30 NOTE — Discharge Instructions (Signed)
Please follow-up with your cardiologist.  Call their office tomorrow morning to request appointment.  If you have any recurrent chest pain, difficulty breathing or other new concerning symptom, please return to ER for reassessment.

## 2021-09-30 NOTE — ED Provider Notes (Signed)
[facility] EMERGENCY DEPARTMENT Provider Note   CSN: [id number] Arrival date & time: 09/30/21  1330     History  Chief Complaint  Patient presents with   Chest Pain    Kaitlyn Parks is a 87 y.o. female.  Presented to the emergency department with concern for chest pain.  Patient had an episode of chest tightness starting yesterday, initially moderate to severe, lasted for a few hours.  Later in the evening it seemed to subside.  This was not associated with exertion.  Not associated with any difficulty breathing.  Discomfort was nonradiating, no back pain.  No episodes of lightheadedness or passing out.  This morning she noted recurrence of the pain, still tightness, not as severe.  This afternoon it was felt to be more mild.  Seem to improve with dose of nitroglycerin.  Reviewed EMS report.  Given nitroglycerin, 324 aspirin in route.  Additional history obtained from chart review, recent E. coli infection in stool, treated with azithromycin, patient does report improvement in those symptoms after taking this medication.  Reviewed last cardiology note, sees Dr. Percival Spanish. Reviewed last cath -   Ost 1st Diag lesion is 95% stenosed. Previously placed Ost LAD to Prox LAD stent (unknown type) is widely patent. Prox LAD lesion is 30% stenosed.   HPI     Home Medications Prior to Admission medications   Medication Sig Start Date End Date Taking? Authorizing Provider  aspirin EC 81 MG tablet Take 81 mg by mouth daily.    [provider]  azithromycin (ZITHROMAX) 500 MG tablet Take 1 tablet (500 mg total) by mouth daily. 09/23/21   Levin Erp, PA  Biotin w/ Vitamins C & E (HAIR/SKIN/NAILS PO) Take 1 tablet by mouth daily.    [provider]  Cranberry 400 MG CAPS Take 400 mg by mouth daily.    [provider]  dicyclomine (BENTYL) 10 MG capsule Take 1 capsule (10 mg total) by mouth 4 (four) times daily. 09/12/21   Levin Erp, PA  famotidine (PEPCID) 20 MG tablet Take 1 tablet (20 mg total) by mouth daily. 09/08/21   Levin Erp, PA  hydrALAZINE (APRESOLINE) 10 MG tablet 10 mg twice a day, may take extra dose for SBP >160 up to a total of 4 times a day 05/29/20   Almyra Deforest, PA  isosorbide mononitrate (IMDUR) 60 MG 24 hr tablet TAKE 1 & 1/2 TABLETS A DAY. 04/08/21   Caren Macadam, MD  Multiple Vitamins-Calcium (ONE-A-DAY WOMENS PO) Take 1 tablet by mouth daily.     [provider]  Multiple Vitamins-Minerals (PRESERVISION AREDS 2 PO) Take 1 capsule by mouth 2 (two) times daily.    [provider]  nitrofurantoin, macrocrystal-monohydrate, (MACROBID) 100 MG capsule Take by mouth. 12/06/20   [provider]  nitroGLYCERIN (NITROSTAT) 0.4 MG SL tablet DISSOLVE 1 TABLET UNDER TONGUE IF NEEDED FOR CHEST PAIN. MAY REPEAT IN 5 MINUTES FOR 3 DOSES. 06/21/20   Minus Breeding, MD  PRESCRIPTION MEDICATION Avastin eye injections given by Dr Coralyn Pear due to macular degeneration    [provider]  rosuvastatin (CRESTOR) 10 MG tablet TAKE (1/2) TABLET DAILY. 10/02/20   Minus Breeding, MD  SYNTHROID 50 MCG tablet TAKE 1 TABLET EACH DAY. 04/30/21   Caren Macadam, MD      Allergies    Acyclovir and related, Pravachol [pravastatin sodium], Sulfa antibiotics, and Zocor [simvastatin]    Review of Systems   Review  of Systems  Constitutional:  Negative for chills and fever.  HENT:  Negative for ear pain and sore throat.   Eyes:  Negative for pain and visual disturbance.  Respiratory:  Positive for chest tightness. Negative for cough and shortness of breath.   Cardiovascular:  Positive for chest pain. Negative for palpitations.  Gastrointestinal:  Negative for abdominal pain and vomiting.  Genitourinary:  Negative for dysuria and hematuria.  Musculoskeletal:  Negative for arthralgias and back pain.  Skin:  Negative for color change and rash.  Neurological:  Negative for seizures  and syncope.  All other systems reviewed and are negative.  Physical Exam Updated Vital Signs BP 136/86    Pulse 68    Temp 98.3 F (36.8 C) (Oral)    Resp (!) 22    SpO2 96%  Physical Exam Vitals and nursing note reviewed.  Constitutional:      General: She is not in acute distress.    Appearance: She is well-developed.  HENT:     Head: Normocephalic and atraumatic.  Eyes:     Conjunctiva/sclera: Conjunctivae normal.  Cardiovascular:     Rate and Rhythm: Normal rate and regular rhythm.     Heart sounds: No murmur heard. Pulmonary:     Effort: Pulmonary effort is normal. No respiratory distress.     Breath sounds: Normal breath sounds.  Abdominal:     Palpations: Abdomen is soft.     Tenderness: There is no abdominal tenderness.  Musculoskeletal:        General: No swelling.     Cervical back: Neck supple.  Skin:    General: Skin is warm and dry.     Capillary Refill: Capillary refill takes less than 2 seconds.  Neurological:     Mental Status: She is alert.  Psychiatric:        Mood and Affect: Mood normal.    ED Results / Procedures / Treatments   Labs (all labs ordered are listed, but only abnormal results are displayed) Labs Reviewed  BASIC METABOLIC PANEL - Abnormal; Notable for the following components:      Result Value   Potassium 3.4 (*)    Glucose, Bld 104 (*)    Calcium 8.8 (*)    All other components within normal limits  CBC  TROPONIN I (HIGH SENSITIVITY)  TROPONIN I (HIGH SENSITIVITY)    EKG EKG Interpretation  Date/Time:  Tuesday September 30 2021 13:34:30 EST Ventricular Rate:  82 PR Interval:  176 QRS Duration: 88 QT Interval:  412 QTC Calculation: 481 R Axis:   72 Text Interpretation: Sinus rhythm with Premature atrial complexes Cannot rule out Anterior infarct , age undetermined Abnormal ECG When compared with ECG of 12-Dec-2020 10:52, No acute changes Confirmed by Madalyn Rob [phone]) on 09/30/2021 3:48:42 PM  Radiology DG Chest 2  View  Result Date: 09/30/2021 CLINICAL DATA:  Chest pain EXAM: CHEST - 2 VIEW COMPARISON:  12/12/2020 FINDINGS: COPD with pulmonary hyperinflation. Lungs clear without infiltrate or effusion. Negative for heart failure. IMPRESSION: COPD.  No acute cardiopulmonary abnormality. Electronically Signed   By: Franchot Gallo M.D.   On: 09/30/2021 14:05    Procedures Procedures    Medications Ordered in ED Medications  nitroGLYCERIN (NITROSTAT) SL tablet 0.4 mg (0.4 mg Sublingual Patient Refused/Not Given 09/30/21 1619)    ED Course/ Medical Decision Making/ A&P  Medical Decision Making Amount and/or Complexity of Data Reviewed Labs: ordered. Radiology: ordered.  Risk Prescription drug management.   87 year old lady with history of CAD presenting to ER with concern for episode of chest tightness.  On exam currently she appears well in no acute distress and has grossly stable vital signs.  EKG without acute ischemic change and initial troponin was normal.  No anemia or electrolyte derangement.  CXR independently reviewed.  No pneumonia or effusion or edema.  Given her cardiac history, consulted cardiology.  Discussed case with Dr. Marlou Porch over the phone, reviewed history, past cath report, EKG today.  He feels so long as her pain has resolved and her enzymes are normal she can follow-up in the outpatient setting with her regular cardiologist.  Repeated troponin.  Negative.  No delta troponin.  On reassessment patient has no ongoing pain.  Initial mild hypertension has improved, now within normal range.  Reviewed return precautions with patient and her friend at bedside.  After the discussed management above, the patient was determined to be safe for discharge.  The patient was in agreement with this plan and all questions regarding their care were answered.  ED return precautions were discussed and the patient will return to the ED with any significant worsening of  condition.          Final Clinical Impression(s) / ED Diagnoses Final diagnoses:  Chest pain, unspecified type    Rx / DC Orders ED Discharge Orders     None         Lucrezia Starch, MD 09/30/21 [phone]

## 2021-09-30 NOTE — ED Triage Notes (Signed)
Patient BIB [facility] from home for evaluation of chest pain described as a heaviness in the center of her chest that started yesterday at approximately 1430. Patient states pain feels similar to when she ended up having a cardiac stent. Patient also reports recent e. Coli infection in January 2023. 324mg  ASA and 1x SL NTG give PTA from EMS. 18g saline lock in left AC.

## 2021-10-03 ENCOUNTER — Other Ambulatory Visit: Payer: Self-pay | Admitting: Cardiology

## 2021-10-12 NOTE — Progress Notes (Deleted)
?Cardiology Office Note:   ? ?Date:  10/12/2021  ? ?ID:  Kaitlyn Parks, DOB [birth year], MRN [id number] ? ?PCP:  Wynn BankerKoberlein, Junell C, MD ?  ?[facility] Providers ?Cardiologist:  Rollene RotundaJames Hochrein, MD { ?Click to update primary MD,subspecialty MD or APP then REFRESH:1}   ? ?Referring MD: Wynn BankerKoberlein, Junell C, MD  ? ?No chief complaint on file. ?*** ? ?History of Present Illness:   ? ?Kaitlyn Parks is a 87 y.o. female with a hx of CAD, bradycardia, hyperlipidemia, hypertension, GERD, hypothyroidism, and urinary incontinence.  She underwent PCI of LAD in 2012.  She had right hip arthroplasty and did well.  Heart catheterization in 2018 for right-sided neck pain revealed 95% ostial D1 lesion, widely patent ostial LAD stent, 30% proximal LAD stenosis.  Medical therapy was recommended.  Imdur 60 mg daily was added to her regimen.  Beta-blocker was initially increased but was later reduced and eventually stopped due to symptomatic bradycardia.  She was last seen in clinic with Dr. Antoine PocheHochrein on 04/24/2021 and was doing well at that time.  She had self discontinued amlodipine and rarely took as needed hydralazine. ? ?[facility] with chest pain.  CE were negative and EKG without acute ischemic changes.  CXR WNL.  Case was discussed with DOD who recommended OP follow-up. ? ?She presents today for scheduled ER follow-up.  ? ? ? ?Chest pain ?- prn nitro ? ? ?CAD ?PCI to LAD in 2012 ?Last heart catheterization in 2018 with 95% ostial D1, widely patent previously placed LAD stent, and proximal LAD lesion of 30%.  Nuclear stress test 05/29/2020 was negative for ischemia. ?- 90 mg imdur, statin, ASA ? ? ?Symptomatic bradycardia ?Will avoid BB ? ? ? ?Hypertension ?Imdur, PRN hydralazine ? ? ? ?Hyperlipidemia with LDL goal < 70 ?03/05/2021: Cholesterol 159; HDL 61.10; LDL Cholesterol 80; Triglycerides 92.0; VLDL 18.4 ?Continue 10 mg crestor ??add zetia ? ? ? ? ?Past Medical History:  ?Diagnosis Date  ? Arthritis   ?  DDD, scoliosis, sees Dr. Lovell SheehanJenkins for this, uses norco very rarely for pain  ? Bradycardia 11/11/2017  ? CAD (coronary artery disease)   ? LAD stenting of a 90% lesion 2012  ? Cystocele   ? Educated about COVID-19 virus infection 12/06/2019  ? Elevated cholesterol   ? GERD (gastroesophageal reflux disease)   ? dx in work up 2016 for atypical CP at OSH   pt. denies  ? Heart murmur   ? Hypertensive retinopathy   ? OU  ? Hypothyroidism   ? Interstitial cystitis   ? sees Dr. Annabell HowellsWrenn  ? Macular degeneration   ? OU  ? NSTEMI (non-ST elevated myocardial infarction) (HCC)   ? Osteoporosis   ? Rectocele   ? S/P hip replacement, right 06/12/2017  ? Scoliosis   ? Thyroid disease   ? Hypothyroid  ? Urinary incontinence   ? USI  ? Uterine prolapse   ? ? ?Past Surgical History:  ?Procedure Laterality Date  ? BLADDER SUSPENSION  2011  ? CATARACT EXTRACTION Bilateral 2015  ? Dr. Elmer PickerHecker  ? CORONARY ANGIOPLASTY WITH STENT PLACEMENT  04/21/2010  ? LAD 80%, RCA 30%, nl EF, s/p DES LAD  ? EYE SURGERY    ? LEFT HEART CATH AND CORONARY ANGIOGRAPHY N/A 06/14/2017  ? Procedure: LEFT HEART CATH AND CORONARY ANGIOGRAPHY;  Surgeon: Runell GessBerry, Jonathan J, MD;  Location: [facility] INVASIVE CV LAB;  Service: Cardiovascular;  Laterality: N/A;  ? OOPHORECTOMY  2011  ?  BSO  ? TOTAL HIP ARTHROPLASTY Right 06/10/2017  ? Procedure: RIGHT TOTAL HIP ARTHROPLASTY ANTERIOR APPROACH;  Surgeon: Samson Frederic, MD;  Location: [facility] ORS;  Service: Orthopedics;  Laterality: Right;  Needs RNFA  ? VAGINAL HYSTERECTOMY  2011  ? LAVH BSO; benign  ? ? ?Current Medications: ?No outpatient medications have been marked as taking for the 10/15/21 encounter (Appointment) with Marcelino Duster, PA.  ?  ? ?Allergies:   Acyclovir and related, Pravachol [pravastatin sodium], Sulfa antibiotics, and Zocor [simvastatin]  ? ?Social History  ? ?Socioeconomic History  ? Marital status: Widowed  ?  Spouse name: Not on file  ? Number of children: 2  ? Years of education: Not on file  ? Highest  education level: Not on file  ?Occupational History  ? Occupation: retired  ?Tobacco Use  ? Smoking status: Never  ? Smokeless tobacco: Never  ?Vaping Use  ? Vaping Use: Never used  ?Substance and Sexual Activity  ? Alcohol use: No  ?  Comment: Rare  ? Drug use: No  ? Sexual activity: Never  ?  Birth control/protection: Surgical  ?Other Topics Concern  ? Not on file  ?Social History Narrative  ? Work or School:  none  ?   ? Home Situation: lives with husband  ?   ? Spiritual Beliefs: [employer]  ?   ? Lifestyle: regular exercise (yoga, water aerobics); diet is healthy  ?   ? ?Social Determinants of Health  ? ?Financial Resource Strain: Not on file  ?Food Insecurity: Not on file  ?Transportation Needs: Not on file  ?Physical Activity: Not on file  ?Stress: Not on file  ?Social Connections: Not on file  ?  ? ?Family History: ?The patient's ***family history includes Arthritis in her daughter; Breast cancer in her cousin, paternal aunt, and paternal aunt; Colon cancer (age of onset: 54) in her paternal aunt; Diabetes in her sister; Heart disease in her brother, father, mother, and sister; Hypertension in her mother; Leukemia in her paternal aunt; Lung cancer in her paternal grandfather and sister; Scoliosis in her sister; Uterine cancer in her sister. ? ?ROS:   ?Please see the history of present illness.    ?*** All other systems reviewed and are negative. ? ?EKGs/Labs/Other Studies Reviewed:   ? ?The following studies were reviewed today: ?*** ? ?EKG:  EKG is *** ordered today.  The ekg ordered today demonstrates *** ? ?Recent Labs: ?08/13/2021: ALT 12; TSH 0.91 ?09/30/2021: BUN 13; Creatinine, Ser 0.71; Hemoglobin 12.3; Platelets 264; Potassium 3.4; Sodium 136  ?Recent Lipid Panel ?   ?Component Value Date/Time  ? CHOL 159 03/05/2021 0811  ? CHOL 160 02/05/2017 0834  ? TRIG 92.0 03/05/2021 0811  ? HDL 61.10 03/05/2021 0811  ? HDL 52 02/05/2017 0834  ? CHOLHDL 3 03/05/2021 0811  ? VLDL 18.4 03/05/2021 0811  ? LDLCALC 80  03/05/2021 0811  ? LDLCALC 72 03/04/2020 0828  ? LDLDIRECT 140.6 05/25/2012 0855  ? ? ? ?Risk Assessment/Calculations:   ?{Does this patient have ATRIAL FIBRILLATION?:[id number]} ? ?    ? ?Physical Exam:   ? ?VS:  There were no vitals taken for this visit.   ? ?Wt Readings from Last 3 Encounters:  ?09/12/21 122 lb 9.6 oz (55.6 kg)  ?08/25/21 126 lb 2 oz (57.2 kg)  ?08/13/21 126 lb 1.6 oz (57.2 kg)  ?  ? ?GEN: *** Well nourished, well developed in no acute distress ?HEENT: Normal ?NECK: No JVD; No carotid bruits ?LYMPHATICS: No lymphadenopathy ?  CARDIAC: ***RRR, no murmurs, rubs, gallops ?RESPIRATORY:  Clear to auscultation without rales, wheezing or rhonchi  ?ABDOMEN: Soft, non-tender, non-distended ?MUSCULOSKELETAL:  No edema; No deformity  ?SKIN: Warm and dry ?NEUROLOGIC:  Alert and oriented x 3 ?PSYCHIATRIC:  Normal affect  ? ?ASSESSMENT:   ? ?No diagnosis found. ?PLAN:   ? ?In order of problems listed above: ? ?*** ? ?   ? ?{Are you ordering a CV Procedure (e.g. stress test, cath, DCCV, TEE, etc)?   Press F2        :[id number]}  ? ? ?Medication Adjustments/Labs and Tests Ordered: ?Current medicines are reviewed at length with the patient today.  Concerns regarding medicines are outlined above.  ?No orders of the defined types were placed in this encounter. ? ?No orders of the defined types were placed in this encounter. ? ? ?There are no Patient Instructions on file for this visit.  ? ?Signed, ?Marcelino Duster, PA  ?10/12/2021 3:47 PM    ?[facility] ?

## 2021-10-15 ENCOUNTER — Other Ambulatory Visit: Payer: Self-pay

## 2021-10-15 ENCOUNTER — Encounter: Payer: Self-pay | Admitting: Physician Assistant

## 2021-10-15 ENCOUNTER — Ambulatory Visit: Payer: Medicare Other | Admitting: Physician Assistant

## 2021-10-15 VITALS — BP 140/62 | HR 69 | Ht 62.0 in | Wt 121.2 lb

## 2021-10-15 DIAGNOSIS — I10 Essential (primary) hypertension: Secondary | ICD-10-CM | POA: Diagnosis not present

## 2021-10-15 DIAGNOSIS — I2510 Atherosclerotic heart disease of native coronary artery without angina pectoris: Secondary | ICD-10-CM

## 2021-10-15 DIAGNOSIS — R079 Chest pain, unspecified: Secondary | ICD-10-CM

## 2021-10-15 DIAGNOSIS — E785 Hyperlipidemia, unspecified: Secondary | ICD-10-CM

## 2021-10-15 DIAGNOSIS — R011 Cardiac murmur, unspecified: Secondary | ICD-10-CM

## 2021-10-15 DIAGNOSIS — R001 Bradycardia, unspecified: Secondary | ICD-10-CM

## 2021-10-15 LAB — HM MAMMOGRAPHY

## 2021-10-15 NOTE — Patient Instructions (Addendum)
Medication Instructions:  ?The current medical regimen is effective;  continue present plan and medications. ? ?*If you need a refill on your cardiac medications before your next appointment, please call your pharmacy* ? ? ?Lab Work: ?None ordered ? ? ?Testing/Procedures: ?Your physician has requested that you have a lexiscan myoview. For further information please visit [email or web address] Please follow instruction sheet, as given. ? ?Your physician has requested that you have an echocardiogram. Echocardiography is a painless test that uses sound waves to create images of your heart. It provides your doctor with information about the size and shape of your heart and how well your heart's chambers and valves are working. This procedure takes approximately one hour. There are no restrictions for this procedure. ? ? ?Follow-Up: ?At [facility], you and your health needs are our priority.  As part of our continuing mission to provide you with exceptional heart care, we have created designated Provider Care Teams.  These Care Teams include your primary Cardiologist (physician) and Advanced Practice Providers (APPs -  Physician Assistants and Nurse Practitioners) who all work together to provide you with the care you need, when you need it. ? ?We recommend signing up for the patient portal called "MyChart".  Sign up information is provided on this After Visit Summary.  MyChart is used to connect with patients for Virtual Visits (Telemedicine).  Patients are able to view lab/test results, encounter notes, upcoming appointments, etc.  Non-urgent messages can be sent to your provider as well.   ?To learn more about what you can do with MyChart, go to [email or web address].   ? ?Your next appointment:   ? After procedures ? ?The format for your next appointment:   ?In Person ? ?Provider:   ?Rollene Rotunda, MD  ?or Micah Flesher, PA ? ?

## 2021-10-15 NOTE — Progress Notes (Signed)
?Cardiology Office Note:   ? ?Date:  10/15/2021  ? ?ID:  Kaitlyn Parks, DOB 04-18-1935, MRN [id number] ? ?PCP:  Wynn BankerKoberlein, Junell C, MD ?  ?[facility] Providers ?Cardiologist:  Rollene RotundaJames Hochrein, MD ?Cardiology APP:  Marcelino Dusteruke, Janasia Coverdale Nicole, PA { ? ?Referring MD: Wynn BankerKoberlein, Junell C, MD  ? ?Chief Complaint  ?Patient presents with  ? Chest Pain  ? Hospitalization Follow-up  ? ? ?History of Present Illness:   ? ?Kaitlyn Parks is a 87 y.o. female with a hx of CAD, bradycardia, hyperlipidemia, hypertension, GERD, hypothyroidism, and urinary incontinence.  She underwent PCI of LAD in 2012.  She had right hip arthroplasty and did well.  Heart catheterization in 2018 for right-sided neck pain revealed 95% ostial D1 lesion, widely patent ostial LAD stent, 30% proximal LAD stenosis.  Medical therapy was recommended.  Imdur 60 mg daily was added to her regimen.  Beta-blocker was initially increased but was later reduced and eventually stopped due to symptomatic bradycardia.  She was last seen in clinic with Dr. Antoine PocheHochrein on 04/24/2021 and was doing well at that time.  She had self discontinued amlodipine and rarely took as needed hydralazine. ? ?[facility] with chest pain.  CE were negative and EKG without acute ischemic changes.  CXR WNL.  Case was discussed with DOD who recommended OP follow-up. ? ?She presents today for scheduled ER follow-up. She recounts her chest pain that started at lunch time. She was not active at the time of chest pain. Took 2 nitro with EMS to relieve her pain. The day after ER discharge, she had another bout of chest pain relieved with nitro x 1. On the third day, she took Pepcid and 324 mg ASA which relieved her chest discomfort. CP was not exertional, she has been doing HIIT training in the water and has not had CP. ? ?She does water exercise twice weekly for arthritis. She is very active, lives at home alone in a two story house. She completes more than 4.0 METS. She is  involved in several clubs and [employer] organizations.  ? ? ?Past Medical History:  ?Diagnosis Date  ? Arthritis   ? DDD, scoliosis, sees Dr. Lovell SheehanJenkins for this, uses norco very rarely for pain  ? Bradycardia 11/11/2017  ? CAD (coronary artery disease)   ? LAD stenting of a 90% lesion 2012  ? Cystocele   ? Educated about COVID-19 virus infection 12/06/2019  ? Elevated cholesterol   ? GERD (gastroesophageal reflux disease)   ? dx in work up 2016 for atypical CP at OSH   pt. denies  ? Heart murmur   ? Hypertensive retinopathy   ? OU  ? Hypothyroidism   ? Interstitial cystitis   ? sees Dr. Annabell HowellsWrenn  ? Macular degeneration   ? OU  ? NSTEMI (non-ST elevated myocardial infarction) (HCC)   ? Osteoporosis   ? Rectocele   ? S/P hip replacement, right 06/12/2017  ? Scoliosis   ? Thyroid disease   ? Hypothyroid  ? Urinary incontinence   ? USI  ? Uterine prolapse   ? ? ?Past Surgical History:  ?Procedure Laterality Date  ? BLADDER SUSPENSION  2011  ? CATARACT EXTRACTION Bilateral 2015  ? Dr. Elmer PickerHecker  ? CORONARY ANGIOPLASTY WITH STENT PLACEMENT  04/21/2010  ? LAD 80%, RCA 30%, nl EF, s/p DES LAD  ? EYE SURGERY    ? LEFT HEART CATH AND CORONARY ANGIOGRAPHY N/A 06/14/2017  ? Procedure: LEFT HEART CATH  AND CORONARY ANGIOGRAPHY;  Surgeon: Runell Gess, MD;  Location: [facility] INVASIVE CV LAB;  Service: Cardiovascular;  Laterality: N/A;  ? OOPHORECTOMY  2011  ? BSO  ? TOTAL HIP ARTHROPLASTY Right 06/10/2017  ? Procedure: RIGHT TOTAL HIP ARTHROPLASTY ANTERIOR APPROACH;  Surgeon: Samson Frederic, MD;  Location: [facility] ORS;  Service: Orthopedics;  Laterality: Right;  Needs RNFA  ? VAGINAL HYSTERECTOMY  2011  ? LAVH BSO; benign  ? ? ?Current Medications: ?Current Meds  ?Medication Sig  ? aspirin EC 81 MG tablet Take 81 mg by mouth daily.  ? Biotin w/ Vitamins C & E (HAIR/SKIN/NAILS PO) Take 1 tablet by mouth daily.  ? Cranberry 400 MG CAPS Take 400 mg by mouth daily.  ? famotidine (PEPCID) 20 MG tablet Take 1 tablet (20 mg total) by mouth daily.  ?  hydrALAZINE (APRESOLINE) 10 MG tablet 10 mg twice a day, may take extra dose for SBP >160 up to a total of 4 times a day  ? isosorbide mononitrate (IMDUR) 60 MG 24 hr tablet TAKE 1 & 1/2 TABLETS A DAY.  ? Multiple Vitamins-Calcium (ONE-A-DAY WOMENS PO) Take 1 tablet by mouth daily.   ? Multiple Vitamins-Minerals (PRESERVISION AREDS 2 PO) Take 1 capsule by mouth 2 (two) times daily.  ? nitrofurantoin, macrocrystal-monohydrate, (MACROBID) 100 MG capsule Take by mouth.  ? nitroGLYCERIN (NITROSTAT) 0.4 MG SL tablet DISSOLVE 1 TABLET UNDER TONGUE IF NEEDED FOR CHEST PAIN. MAY REPEAT IN 5 MINUTES FOR 3 DOSES.  ? PRESCRIPTION MEDICATION Avastin eye injections given by Dr Vanessa Barbara due to macular degeneration  ? rosuvastatin (CRESTOR) 10 MG tablet TAKE (1/2) TABLET DAILY.  ? SYNTHROID 50 MCG tablet TAKE 1 TABLET EACH DAY.  ?  ? ?Allergies:   Acyclovir and related, Pravachol [pravastatin sodium], Sulfa antibiotics, and Zocor [simvastatin]  ? ?Social History  ? ?Socioeconomic History  ? Marital status: Widowed  ?  Spouse name: Not on file  ? Number of children: 2  ? Years of education: Not on file  ? Highest education level: Not on file  ?Occupational History  ? Occupation: retired  ?Tobacco Use  ? Smoking status: Never  ? Smokeless tobacco: Never  ?Vaping Use  ? Vaping Use: Never used  ?Substance and Sexual Activity  ? Alcohol use: No  ?  Comment: Rare  ? Drug use: No  ? Sexual activity: Never  ?  Birth control/protection: Surgical  ?Other Topics Concern  ? Not on file  ?Social History Narrative  ? Work or School:  none  ?   ? Home Situation: lives with husband  ?   ? Spiritual Beliefs: Methodist  ?   ? Lifestyle: regular exercise (yoga, water aerobics); diet is healthy  ?   ? ?Social Determinants of Health  ? ?Financial Resource Strain: Not on file  ?Food Insecurity: Not on file  ?Transportation Needs: Not on file  ?Physical Activity: Not on file  ?Stress: Not on file  ?Social Connections: Not on file  ?  ? ?Family  History: ?The patient's family history includes Arthritis in her daughter; Breast cancer in her cousin, paternal aunt, and paternal aunt; Colon cancer (age of onset: 43) in her paternal aunt; Diabetes in her sister; Heart disease in her brother, father, mother, and sister; Hypertension in her mother; Leukemia in her paternal aunt; Lung cancer in her paternal grandfather and sister; Scoliosis in her sister; Uterine cancer in her sister. ? ?ROS:   ?Please see the history of present illness.    ?  All other systems reviewed and are negative. ? ?EKGs/Labs/Other Studies Reviewed:   ? ?The following studies were reviewed today: ? ?Heart cath 2018 ?Ost 1st Diag lesion is 95% stenosed. ?Previously placed Ost LAD to Prox LAD stent (unknown type) is widely patent. ?Prox LAD lesion is 30% stenosed. ? ?EKG:  EKG is not ordered today.  ? ?Recent Labs: ?08/13/2021: ALT 12; TSH 0.91 ?09/30/2021: BUN 13; Creatinine, Ser 0.71; Hemoglobin 12.3; Platelets 264; Potassium 3.4; Sodium 136  ?Recent Lipid Panel ?   ?Component Value Date/Time  ? CHOL 159 03/05/2021 0811  ? CHOL 160 02/05/2017 0834  ? TRIG 92.0 03/05/2021 0811  ? HDL 61.10 03/05/2021 0811  ? HDL 52 02/05/2017 0834  ? CHOLHDL 3 03/05/2021 0811  ? VLDL 18.4 03/05/2021 0811  ? LDLCALC 80 03/05/2021 0811  ? LDLCALC 72 03/04/2020 0828  ? LDLDIRECT 140.6 05/25/2012 0855  ? ? ? ?Risk Assessment/Calculations:   ?  ? ?    ? ?Physical Exam:   ? ?VS:  BP 140/62   Pulse 69   Ht 5\' 2"  (1.575 m)   Wt 121 lb 3.2 oz (55 kg)   SpO2 98%   BMI 22.17 kg/m?    ? ?Wt Readings from Last 3 Encounters:  ?10/15/21 121 lb 3.2 oz (55 kg)  ?09/12/21 122 lb 9.6 oz (55.6 kg)  ?08/25/21 126 lb 2 oz (57.2 kg)  ?  ? ?GEN:  Well nourished, well developed in no acute distress ?HEENT: Normal ?NECK: No JVD; No carotid bruits ?LYMPHATICS: No lymphadenopathy ?CARDIAC: RRR, no murmurs, rubs, gallops ?RESPIRATORY:  Clear to auscultation without rales, wheezing or rhonchi  ?ABDOMEN: Soft, non-tender,  non-distended ?MUSCULOSKELETAL:  No edema; No deformity  ?SKIN: Warm and dry ?NEUROLOGIC:  Alert and oriented x 3 ?PSYCHIATRIC:  Normal affect  ? ?ASSESSMENT:   ? ?1. Coronary artery disease involving native heart without angina pectoris, unsp

## 2021-10-17 ENCOUNTER — Other Ambulatory Visit: Payer: Self-pay | Admitting: Physician Assistant

## 2021-10-17 ENCOUNTER — Other Ambulatory Visit: Payer: Self-pay | Admitting: Family Medicine

## 2021-10-17 ENCOUNTER — Other Ambulatory Visit: Payer: Self-pay | Admitting: Cardiology

## 2021-10-17 DIAGNOSIS — I10 Essential (primary) hypertension: Secondary | ICD-10-CM

## 2021-10-17 NOTE — Telephone Encounter (Signed)
Last Ov 08/13/21 ?Filled 04/08/21 ?Is it ok to refill? ? ?

## 2021-10-17 NOTE — Progress Notes (Signed)
?[facility] - Clinic Note ? ?10/20/2021 ? ?  ?CHIEF COMPLAINT ?Patient presents for Retina Follow Up ? ?HISTORY OF PRESENT ILLNESS: ?Kaitlyn Parks is a 87 y.o. female who presents to the clinic today for:  ? ?HPI   ? ? Retina Follow Up   ?Patient presents with  Wet AMD.  In left eye.  This started 7 weeks ago.  I, the attending physician,  performed the HPI with the patient and updated documentation appropriately. ? ?  ?  ? ? Comments   ?Patient here for 7 weeks retina follow up for exu ARMD OS. Patient states vision doing ok. Sometimes blurry when has dryness. Uses drops then it clears up. No eye pain. Having heart tests done next week. Had Ecoli was on erythromycin.  ? ?  ?  ?Last edited by Rennis Chris, MD on 10/20/2021  2:03 PM.  ?  ?Pt states she had e.coli for 10 weeks, she kept being tested to see which strand it was to see which medication would help, she states she has also had to have nitroglycerin a couple times recently, so she his having tests on Monday ? ? ?Referring physician: ?Wynn Banker, MD ?[phone] ?[place],  [place] [address] ? ?HISTORICAL INFORMATION:  ?Selected notes from the MEDICAL RECORD NUMBER ?Referred by Dr. [place] DeMarco for concern of exu ARMD ?LEE: 11.26.19 (J. 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 ?CURRENT MEDICATIONS: ?No current outpatient medications on file. (Ophthalmic Drugs)  ? ?No current facility-administered medications for this visit. (Ophthalmic Drugs)  ? ?Current Outpatient Medications (Other)  ?Medication Sig  ? aspirin EC 81 MG tablet Take 81 mg by mouth daily.  ? Biotin w/ Vitamins C & E (HAIR/SKIN/NAILS PO) Take 1 tablet by mouth daily.  ? Cranberry 400 MG CAPS Take 400 mg by mouth daily.  ? famotidine (PEPCID) 20 MG tablet Take 1 tablet (20 mg total) by mouth daily.  ? hydrALAZINE (APRESOLINE) 10 MG tablet TAKE (1) TABLET TWICE  A DAY. MAY TAKE EXTRA DOSE FOR SBP >160 UP TO A TOTAL OF 4 TIMES A DAY.  ? isosorbide mononitrate (IMDUR) 60 MG 24 hr tablet TAKE 1 & 1/2 TABLETS A DAY.  ? Multiple Vitamins-Calcium (ONE-A-DAY WOMENS PO) Take 1 tablet by mouth daily.   ? Multiple Vitamins-Minerals (PRESERVISION AREDS 2 PO) Take 1 capsule by mouth 2 (two) times daily.  ? nitrofurantoin, macrocrystal-monohydrate, (MACROBID) 100 MG capsule Take by mouth.  ? nitroGLYCERIN (NITROSTAT) 0.4 MG SL tablet DISSOLVE 1 TABLET UNDER TONGUE IF NEEDED FOR CHEST PAIN. MAY REPEAT IN 5 MINUTES FOR 3 DOSES.  ? PRESCRIPTION MEDICATION Avastin eye injections given by Dr Vanessa Barbara due to macular degeneration  ? rosuvastatin (CRESTOR) 10 MG tablet TABLET ONE-HALF TABLET BY MOUTH DAILY  ? SYNTHROID 50 MCG tablet TAKE 1 TABLET EACH DAY.  ? ?No current facility-administered medications for this visit. (Other)  ? ?REVIEW OF SYSTEMS: ?ROS   ?Positive for: Gastrointestinal, Genitourinary, Musculoskeletal, Cardiovascular, Eyes ?Negative for: Constitutional, Neurological, Skin, HENT, Endocrine, Respiratory, Psychiatric, Allergic/Imm, Heme/Lymph ?Last edited by Laddie Aquas, COA on 10/20/2021  1:22 PM.  ?  ? ?ALLERGIES ?Allergies  ?Allergen Reactions  ? Acyclovir And Related Other (See Comments)  ?  unknown  ? Pravachol [Pravastatin Sodium] Other (See Comments)  ?  cystitis  ? Sulfa Antibiotics   ?  nausea  ? Zocor [Simvastatin] Other (See  Comments)  ?  cystitis  ? ?PAST MEDICAL HISTORY ?Past Medical History:  ?Diagnosis Date  ? Arthritis   ? DDD, scoliosis, sees Dr. Lovell SheehanJenkins for this, uses norco very rarely for pain  ? Bradycardia 11/11/2017  ? CAD (coronary artery disease)   ? LAD stenting of a 90% lesion 2012  ? Cystocele   ? Educated about COVID-19 virus infection 12/06/2019  ? Elevated cholesterol   ? GERD (gastroesophageal reflux disease)   ? dx in work up 2016 for atypical CP at OSH   pt. denies  ? Heart murmur   ? Hypertensive retinopathy   ? OU  ? Hypothyroidism   ?  Interstitial cystitis   ? sees Dr. Annabell HowellsWrenn  ? Macular degeneration   ? OU  ? NSTEMI (non-ST elevated myocardial infarction) (HCC)   ? Osteoporosis   ? Rectocele   ? S/P hip replacement, right 06/12/2017  ? Scoliosis   ? Thyroid disease   ? Hypothyroid  ? Urinary incontinence   ? USI  ? Uterine prolapse   ? ?Past Surgical History:  ?Procedure Laterality Date  ? BLADDER SUSPENSION  2011  ? CATARACT EXTRACTION Bilateral 2015  ? Dr. Elmer PickerHecker  ? CORONARY ANGIOPLASTY WITH STENT PLACEMENT  04/21/2010  ? LAD 80%, RCA 30%, nl EF, s/p DES LAD  ? EYE SURGERY    ? LEFT HEART CATH AND CORONARY ANGIOGRAPHY N/A 06/14/2017  ? Procedure: LEFT HEART CATH AND CORONARY ANGIOGRAPHY;  Surgeon: Runell GessBerry, Jonathan J, MD;  Location: [facility] INVASIVE CV LAB;  Service: Cardiovascular;  Laterality: N/A;  ? OOPHORECTOMY  2011  ? BSO  ? TOTAL HIP ARTHROPLASTY Right 06/10/2017  ? Procedure: RIGHT TOTAL HIP ARTHROPLASTY ANTERIOR APPROACH;  Surgeon: Samson FredericSwinteck, Manveer Gomes, MD;  Location: [facility] ORS;  Service: Orthopedics;  Laterality: Right;  Needs RNFA  ? VAGINAL HYSTERECTOMY  2011  ? LAVH BSO; benign  ? ?FAMILY HISTORY ?Family History  ?Problem Relation Age of Onset  ? Hypertension Mother   ? Heart disease Mother   ? Heart disease Father   ? Heart disease Sister   ? Diabetes Sister   ? Uterine cancer Sister   ?     mets to lungs  ? Lung cancer Sister   ? Scoliosis Sister   ? Heart disease Brother   ? Lung cancer Paternal Grandfather   ?     smoker  ? Arthritis Daughter   ? Colon cancer Paternal Aunt [id number]  ? Breast cancer Paternal Aunt   ?     Age 85's  ? Breast cancer Paternal Aunt   ? Leukemia Paternal Aunt   ? Breast cancer Cousin   ?     Maternal 1st cousins-Age 85's  ? ?SOCIAL HISTORY ?Social History  ? ?Tobacco Use  ? Smoking status: Never  ? Smokeless tobacco: Never  ?Vaping Use  ? Vaping Use: Never used  ?Substance Use Topics  ? Alcohol use: No  ?  Comment: Rare  ? Drug use: No  ?  ? ?  ?OPHTHALMIC EXAM: ?Base Eye Exam   ? ? Visual Acuity (Snellen - Linear)   ? ?    Right Left  ? Dist [place] 20/30 -2 20/25 -1  ? Dist ph [place] NI NI  ? ?  ?  ? ? Tonometry (Tonopen, 1:18 PM)   ? ?   Right Left  ? Pressure 06 11  ? ?  ?  ? ? Pupils   ? ?   Dark Light Shape React APD  ?  Right 3 2 Round Brisk None  ? Left 3 2 Round Brisk None  ? ?  ?  ? ? Visual Fields (Counting fingers)   ? ?   Left Right  ?  Full Full  ? ?  ?  ? ? Extraocular Movement   ? ?   Right Left  ?  Full, Ortho Full, Ortho  ? ?  ?  ? ? Neuro/Psych   ? ? Oriented x3: Yes  ? Mood/Affect: Normal  ? ?  ?  ? ? Dilation   ? ? Both eyes: 1.0% Mydriacyl, 2.5% Phenylephrine @ 1:18 PM  ? ?  ?  ? ?  ? ?Slit Lamp and Fundus Exam   ? ? Slit Lamp Exam   ? ?   Right Left  ? Lids/Lashes mild Telangiectasia, marginal leasion nasal UL Telangiectasia, Meibomian gland dysfunction, lower lid edema  ? Conjunctiva/Sclera White and quiet White and quiet, inferior conj chalasis  ? Cornea Arcus, 2+ Punctate epithelial erosions 3+ inferior punctate epithelial erosions, Arcus  ? Anterior Chamber Deep and clear Deep and clear  ? Iris Round and dilated Round and well dilated  ? Lens PC IOL in good position, trace Posterior capsular opacification (linear, extending just to visual axis) PC IOL in excellent position  ? Anterior Vitreous Vitreous syneresis, Posterior vitreous detachment Vitreous syneresis, Posterior vitreous detachment, vitreous condensations  ? ?  ?  ? ? Fundus Exam   ? ?   Right Left  ? Disc Pink and Sharp, Compact, focal PPP Pink and Sharp, mild temporal PPA  ? C/D Ratio 0.2 0.3  ? Macula Blunted foveal reflex, Drusen, RPE mottling and clumping, early Atrophy, +PEDs -- stably improved, stable improvement in central cyst / IRF, No heme Blunted foveal reflex, +drusen, pigment clumping, RPE mottling, clumping and early atrophy, no heme, central PED / CNV with overlying cystic changes -- stably improved  ? Vessels Vascular attenuation Vascular attenuation  ? Periphery Attached, scattered reticular degeneration   Attached, scattered reticular  degeneration  ? ?  ?  ? ?  ? ?IMAGING AND PROCEDURES  ?Imaging and Procedures for @TODAY @ ? ?OCT, Retina - OU - Both Eyes   ? ?   ?Right Eye ?Quality was good. Central Foveal Thickness: 234. Progression has been

## 2021-10-20 ENCOUNTER — Telehealth (HOSPITAL_COMMUNITY): Payer: Self-pay | Admitting: *Deleted

## 2021-10-20 ENCOUNTER — Ambulatory Visit (INDEPENDENT_AMBULATORY_CARE_PROVIDER_SITE_OTHER): Payer: Medicare Other | Admitting: Ophthalmology

## 2021-10-20 ENCOUNTER — Other Ambulatory Visit: Payer: Self-pay | Admitting: Family Medicine

## 2021-10-20 ENCOUNTER — Other Ambulatory Visit: Payer: Self-pay

## 2021-10-20 ENCOUNTER — Encounter (INDEPENDENT_AMBULATORY_CARE_PROVIDER_SITE_OTHER): Payer: Self-pay | Admitting: Ophthalmology

## 2021-10-20 DIAGNOSIS — H04123 Dry eye syndrome of bilateral lacrimal glands: Secondary | ICD-10-CM

## 2021-10-20 DIAGNOSIS — E039 Hypothyroidism, unspecified: Secondary | ICD-10-CM

## 2021-10-20 DIAGNOSIS — H353221 Exudative age-related macular degeneration, left eye, with active choroidal neovascularization: Secondary | ICD-10-CM

## 2021-10-20 DIAGNOSIS — H35033 Hypertensive retinopathy, bilateral: Secondary | ICD-10-CM

## 2021-10-20 DIAGNOSIS — H353112 Nonexudative age-related macular degeneration, right eye, intermediate dry stage: Secondary | ICD-10-CM | POA: Diagnosis not present

## 2021-10-20 DIAGNOSIS — I10 Essential (primary) hypertension: Secondary | ICD-10-CM

## 2021-10-20 DIAGNOSIS — Z961 Presence of intraocular lens: Secondary | ICD-10-CM

## 2021-10-20 MED ORDER — BEVACIZUMAB CHEMO INJECTION 1.25MG/0.05ML SYRINGE FOR KALEIDOSCOPE
1.2500 mg | INTRAVITREAL | Status: AC | PRN
  Administered 2021-10-20: 1.25 mg via INTRAVITREAL

## 2021-10-20 NOTE — Telephone Encounter (Signed)
Left message on voicemail per DPR in reference to upcoming appointment scheduled on 10/27/21 at 10:00 with detailed instructions given per Myocardial Perfusion Study Information Sheet for the test. LM to arrive 15 minutes early, and that it is imperative to arrive on time for appointment to keep from having the test rescheduled. If you need to cancel or reschedule your appointment, please call the office within 24 hours of your appointment. Failure to do so may result in a cancellation of your appointment, and a $50 no show fee. Phone number given for call back for any questions.  ? ?

## 2021-10-22 ENCOUNTER — Encounter: Payer: Self-pay | Admitting: Physician Assistant

## 2021-10-22 ENCOUNTER — Ambulatory Visit: Payer: Medicare Other | Admitting: Physician Assistant

## 2021-10-22 VITALS — BP 142/70 | HR 70 | Ht 62.0 in | Wt 122.0 lb

## 2021-10-22 DIAGNOSIS — A041 Enterotoxigenic Escherichia coli infection: Secondary | ICD-10-CM | POA: Diagnosis not present

## 2021-10-22 DIAGNOSIS — R1084 Generalized abdominal pain: Secondary | ICD-10-CM

## 2021-10-22 DIAGNOSIS — R112 Nausea with vomiting, unspecified: Secondary | ICD-10-CM | POA: Diagnosis not present

## 2021-10-22 NOTE — Patient Instructions (Signed)
Follow up as needed ? ?If you are age 85 or older, your body mass index should be between 23-30. Your Body mass index is 22.31 kg/m?Marland Kitchen If this is out of the aforementioned range listed, please consider follow up with your Primary Care Provider. ? ?If you are age 43 or younger, your body mass index should be between 19-25. Your Body mass index is 22.31 kg/m?Marland Kitchen If this is out of the aformentioned range listed, please consider follow up with your Primary Care Provider.  ? ?________________________________________________________ ? ?The [facility] providers would like to encourage you to use [facility] to communicate with providers for non-urgent requests or questions.  Due to long hold times on the telephone, sending your provider a message by [facility] may be a faster and more efficient way to get a response.  Please allow 48 business hours for a response.  Please remember that this is for non-urgent requests.  ?_______________________________________________________  ? ?I appreciate the  opportunity to care for you ? ?Thank You  ? ?Jacelyn Grip ?

## 2021-10-22 NOTE — Progress Notes (Signed)
? ?Chief Complaint: Follow-up E. coli ? ?HPI: ?   Kaitlyn Parks is an 87 year old Caucasian female known to Dr. [name], with a past medical history of CAD, cystocele, NSTEMI and multiple others, who returns to clinic today for follow-up of her diarrhea. ?   08/18/2016 patient seen in clinic by me and discussed Cologuard.  Cologuard returned -   09/04/2016 was discussed patient had no further screening for colon cancer. ?   08/13/2021 CBC and CMP normal.  She was seen in clinic by her PCP at that time and discussed that since mid December after eating beef tips at a luncheon she had gotten very ill with vomiting and had not felt well since.  Describes no appetite, dizziness and stomach burning.  Apparently had Salmonella before that visit and it made her sick and was painful, Gas-X and Pepto-Bismol helped.  She remained nauseous since that time.  We discussed 2 loose bowel movements a day.  Discussed that antinausea medication was limited secondary to prolonged QT and she was told to continue ginger ale.  It was recommended she avoid dairy and she had labs and stool studies.  An abdominal x-ray was ordered. ?   08/15/2021 patient had a fecal occult blood study which was positive.  Other fecal results including fecal leukocytes, C. difficile, stool culture all negative. ?   08/25/2021 patient seen in clinic and was feeling mostly better.  She had had some nausea that day.  Otherwise her bowel habits are back to normal.  At that time suggested she restart her children's probiotic (adult ones give her diarrhea) and take daily for the next couple of months.  If symptoms came back or she saw any further blood then to let us know. ?   09/08/2021 patient describes significant stomach pain and cramping since 2/3.  When symptoms started she took Pepto-Bismol.  Described black stool at night.  Also some looser than normal bowel movements.  Recommended she take Pepcid 20 mg daily for a week to see if this helped.  Also prescribe some  Dicyclomine as needed for abdominal pain. ?   09/09/2021 abdominal x-ray with mild retained fecal matter and no obstructive changes. ?   09/12/2021 office visit with me, described that her symptoms have come back over the past week with urgent diarrhea and some incontinence.  She was feeling better with Pepcid 20 mg twice daily as far as her nausea and Dicyclomine 10 mg twice daily had helped with abdominal pain.  At that time ordered a GI pathogen panel, O&P, fecal lactoferrin and calprotectin as well as fecal pancreatic elastase.  Recommended she increase Dicyclomine to 10 mg 4 times daily and continue Pepcid 20 mg twice daily. ?   09/15/2021 stool testing returned positive for enterotoxigenic E. coli and she was started on Ciprofloxacin 500 twice daily x5 days. ?   09/26/2021 patient described continued symptoms of E. coli with abdominal pain and some nausea.  Patient declined Zofran for nausea and wanted to use Ginger. ?   Today, the patient tells me that she is completely better.  She has not needed her Dicyclomine in some time and she only uses her Pepcid as needed when she knows she is going to eat something acidic.  Tells me she no longer has any complaints or concerns for [place]. ?   Denies fever, chills, weight loss, abdominal pain or symptoms that awaken her from sleep. ? ?Past Medical History:  ?Diagnosis Date  ? Arthritis   ?  DDD, scoliosis, sees Dr. Lovell SheehanJenkins for this, uses norco very rarely for pain  ? Bradycardia 11/11/2017  ? CAD (coronary artery disease)   ? LAD stenting of a 90% lesion 2012  ? Cystocele   ? Educated about COVID-19 virus infection 12/06/2019  ? Elevated cholesterol   ? GERD (gastroesophageal reflux disease)   ? dx in work up 2016 for atypical CP at OSH   pt. denies  ? Heart murmur   ? Hypertensive retinopathy   ? OU  ? Hypothyroidism   ? Interstitial cystitis   ? sees Dr. Annabell HowellsWrenn  ? Macular degeneration   ? OU  ? NSTEMI (non-ST elevated myocardial infarction) (HCC)   ? Osteoporosis   ? Rectocele    ? S/P hip replacement, right 06/12/2017  ? Scoliosis   ? Thyroid disease   ? Hypothyroid  ? Urinary incontinence   ? USI  ? Uterine prolapse   ? ? ?Past Surgical History:  ?Procedure Laterality Date  ? BLADDER SUSPENSION  2011  ? CATARACT EXTRACTION Bilateral 2015  ? Dr. Elmer PickerHecker  ? CORONARY ANGIOPLASTY WITH STENT PLACEMENT  04/21/2010  ? LAD 80%, RCA 30%, nl EF, s/p DES LAD  ? EYE SURGERY    ? LEFT HEART CATH AND CORONARY ANGIOGRAPHY N/A 06/14/2017  ? Procedure: LEFT HEART CATH AND CORONARY ANGIOGRAPHY;  Surgeon: Runell GessBerry, Jonathan J, MD;  Location: [facility];  Service: Cardiovascular;  Laterality: N/A;  ? OOPHORECTOMY  2011  ? BSO  ? TOTAL HIP ARTHROPLASTY Right 06/10/2017  ? Procedure: RIGHT TOTAL HIP ARTHROPLASTY ANTERIOR APPROACH;  Surgeon: Samson FredericSwinteck, Brian, MD;  Location: [facility];  Service: Orthopedics;  Laterality: Right;  Needs RNFA  ? VAGINAL HYSTERECTOMY  2011  ? LAVH BSO; benign  ? ? ?Current Outpatient Medications  ?Medication Sig Dispense Refill  ? aspirin EC 81 MG tablet Take 81 mg by mouth daily.    ? Biotin w/ Vitamins C & E (HAIR/SKIN/NAILS PO) Take 1 tablet by mouth daily.    ? Cranberry 400 MG CAPS Take 400 mg by mouth daily.    ? famotidine (PEPCID) 20 MG tablet Take 1 tablet (20 mg total) by mouth daily. 30 tablet 0  ? hydrALAZINE (APRESOLINE) 10 MG tablet TAKE (1) TABLET TWICE A DAY. MAY TAKE EXTRA DOSE FOR SBP >160 UP TO A TOTAL OF 4 TIMES A DAY. 180 tablet 3  ? isosorbide mononitrate (IMDUR) 60 MG 24 hr tablet TAKE 1 & 1/2 TABLETS A DAY. 135 tablet 1  ? Multiple Vitamins-Calcium (ONE-A-DAY WOMENS PO) Take 1 tablet by mouth daily.     ? Multiple Vitamins-Minerals (PRESERVISION AREDS 2 PO) Take 1 capsule by mouth 2 (two) times daily.    ? nitrofurantoin, macrocrystal-monohydrate, (MACROBID) 100 MG capsule Take by mouth.    ? nitroGLYCERIN (NITROSTAT) 0.4 MG SL tablet DISSOLVE 1 TABLET UNDER TONGUE IF NEEDED FOR CHEST PAIN. MAY REPEAT IN 5 MINUTES FOR 3 DOSES. 25 tablet 3  ? PRESCRIPTION  MEDICATION Avastin eye injections given by Dr Vanessa BarbaraZamora due to macular degeneration    ? rosuvastatin (CRESTOR) 10 MG tablet TABLET ONE-HALF TABLET BY MOUTH DAILY 45 tablet 3  ? SYNTHROID 50 MCG tablet TAKE 1 TABLET EACH DAY. 90 tablet 1  ? ?No current facility-administered medications for this visit.  ? ? ?Allergies as of 10/22/2021 - Review Complete 10/20/2021  ?Allergen Reaction Noted  ? Acyclovir and related Other (See Comments) 06/04/2017  ? Pravachol [pravastatin sodium] Other (See Comments) 05/30/2012  ? Sulfa antibiotics  09/15/2020  ? Zocor [simvastatin] Other (See Comments) 05/30/2012  ? ? ?Family History  ?Problem Relation Age of Onset  ? Hypertension Mother   ? Heart disease Mother   ? Heart disease Father   ? Heart disease Sister   ? Diabetes Sister   ? Uterine cancer Sister   ?     mets to lungs  ? Lung cancer Sister   ? Scoliosis Sister   ? Heart disease Brother   ? Lung cancer Paternal Grandfather   ?     smoker  ? Arthritis Daughter   ? Colon cancer Paternal Aunt 29  ? Breast cancer Paternal Aunt   ?     Age 43's  ? Breast cancer Paternal Aunt   ? Leukemia Paternal Aunt   ? Breast cancer Cousin   ?     Maternal 1st cousins-Age 31's  ? ? ?Social History  ? ?Socioeconomic History  ? Marital status: Widowed  ?  Spouse name: Not on file  ? Number of children: 2  ? Years of education: Not on file  ? Highest education level: Not on file  ?Occupational History  ? Occupation: retired  ?Tobacco Use  ? Smoking status: Never  ? Smokeless tobacco: Never  ?Vaping Use  ? Vaping Use: Never used  ?Substance and Sexual Activity  ? Alcohol use: No  ?  Comment: Rare  ? Drug use: No  ? Sexual activity: Never  ?  Birth control/protection: Surgical  ?Other Topics Concern  ? Not on file  ?Social History Narrative  ? Work or School:  none  ?   ? Home Situation: lives with husband  ?   ? Spiritual Beliefs: [employer]  ?   ? Lifestyle: regular exercise (yoga, water aerobics); diet is healthy  ?   ? ?Social Determinants of Health   ? ?Financial Resource Strain: Not on file  ?Food Insecurity: Not on file  ?Transportation Needs: Not on file  ?Physical Activity: Not on file  ?Stress: Not on file  ?Social Connections: Not on file  ?[place]

## 2021-10-23 ENCOUNTER — Encounter: Payer: Self-pay | Admitting: Family Medicine

## 2021-10-27 ENCOUNTER — Other Ambulatory Visit: Payer: Self-pay

## 2021-10-27 ENCOUNTER — Ambulatory Visit (HOSPITAL_BASED_OUTPATIENT_CLINIC_OR_DEPARTMENT_OTHER): Payer: Medicare Other

## 2021-10-27 ENCOUNTER — Ambulatory Visit (HOSPITAL_COMMUNITY): Payer: Medicare Other | Attending: Cardiology

## 2021-10-27 DIAGNOSIS — R079 Chest pain, unspecified: Secondary | ICD-10-CM

## 2021-10-27 DIAGNOSIS — I2510 Atherosclerotic heart disease of native coronary artery without angina pectoris: Secondary | ICD-10-CM

## 2021-10-27 DIAGNOSIS — R011 Cardiac murmur, unspecified: Secondary | ICD-10-CM | POA: Insufficient documentation

## 2021-10-27 LAB — MYOCARDIAL PERFUSION IMAGING
LV dias vol: 66 mL (ref 46–106)
LV sys vol: 24 mL
Nuc Stress EF: 64 %
Rest Nuclear Isotope Dose: 10.4 mCi
SDS: 6
SRS: 3
SSS: 9
ST Depression (mm): 0 mm
Stress Nuclear Isotope Dose: 32.9 mCi
TID: 0.98

## 2021-10-27 LAB — ECHOCARDIOGRAM COMPLETE
Area-P 1/2: 1.65 cm2
MV M vel: 6.67 m/s
MV Peak grad: 178 mmHg
P 1/2 time: 1160 msec
Radius: 0.5 cm
S' Lateral: 2.4 cm

## 2021-10-27 MED ORDER — TECHNETIUM TC 99M TETROFOSMIN IV KIT
32.9000 | PACK | Freq: Once | INTRAVENOUS | Status: DC | PRN
  Filled 2021-10-27: qty 33

## 2021-10-27 MED ORDER — REGADENOSON 0.4 MG/5ML IV SOLN
0.4000 mg | Freq: Once | INTRAVENOUS | Status: AC
  Administered 2021-10-27: 0.4 mg via INTRAVENOUS

## 2021-10-27 MED ORDER — TECHNETIUM TC 99M TETROFOSMIN IV KIT
10.4000 | PACK | Freq: Once | INTRAVENOUS | Status: AC | PRN
  Administered 2021-10-27: 10.4 via INTRAVENOUS
  Filled 2021-10-27: qty 11

## 2021-10-27 MED ORDER — TECHNETIUM TC 99M TETROFOSMIN IV KIT
32.9000 | PACK | Freq: Once | INTRAVENOUS | Status: AC | PRN
  Administered 2021-10-27: 32.9 via INTRAVENOUS
  Filled 2021-10-27: qty 33

## 2021-10-28 NOTE — Progress Notes (Signed)
Assessment and plans reviewed  

## 2021-11-01 NOTE — Progress Notes (Signed)
?Cardiology Office Note:   ? ?Date:  11/13/2021  ? ?ID:  JAPLEEN TORNOW, DOB August 30, 1934, MRN [id number] ? ?PCP:  Wynn Banker, MD ?  ?[facility] Providers ?Cardiologist:  Rollene Rotunda, MD ?Cardiology APP:  Marcelino Duster, PA { ?Referring MD: Wynn Banker, MD  ? ?Chief Complaint  ?Patient presents with  ? Follow-up  ?  Chest pain  ? ? ?History of Present Illness:   ? ?Kaitlyn Parks is a 87 y.o. female with a hx of CAD, bradycardia, hyperlipidemia, hypertension, GERD, hypothyroidism, and urinary incontinence.  She underwent PCI of LAD in 2012.  She had right hip arthroplasty and did well.  Heart catheterization in 2018 for right-sided neck pain revealed 95% ostial D1 lesion, widely patent ostial LAD stent, 30% proximal LAD stenosis.  Medical therapy was recommended.  Imdur 60 mg daily was added to her regimen.  Beta-blocker was initially increased but was later reduced and eventually stopped due to symptomatic bradycardia.  She was last seen in clinic with Dr. Antoine Poche on 04/24/2021 and was doing well at that time.  She had self discontinued amlodipine and rarely took as needed hydralazine. ? ?She presented to [facility] ED 09/30/2021 with chest pain.  CE were negative and EKG without acute ischemic changes.  CXR WNL.  Case was discussed with DOD who recommended OP follow-up. I saw her for ER follow up 10/15/21. She recounted nitro responsive chest pain but had also been doing HIIT workouts with water aerobics without pain. CP also relieved with Pepcid and tums. I opted to obtain a nuclear stress test which showed no evidence of ischemia. EF was read as normal. Echocardiogram showed normal LVEF and grade 1 DD, and mild to moderate MR.  ? ?She returns for follow up.  She has had no further chest pain.  She continues to do water aerobics with interval training.  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 ? ? ?Past Medical History:  ?Diagnosis  Date  ? Arthritis   ? DDD, scoliosis, sees Dr. Lovell Sheehan for this, uses norco very rarely for pain  ? Bradycardia 11/11/2017  ? CAD (coronary artery disease)   ? LAD stenting of a 90% lesion 2012  ? Cystocele   ? Educated about COVID-19 virus infection 12/06/2019  ? Elevated cholesterol   ? GERD (gastroesophageal reflux disease)   ? dx in work up 2016 for atypical CP at OSH   pt. denies  ? Heart murmur   ? Hypertensive retinopathy   ? OU  ? Hypothyroidism   ? Interstitial cystitis   ? sees Dr. [name]  ? Macular degeneration   ? OU  ? NSTEMI (non-ST elevated myocardial infarction) (HCC)   ? Osteoporosis   ? Rectocele   ? S/P hip replacement, right 06/12/2017  ? Scoliosis   ? Thyroid disease   ? Hypothyroid  ? Urinary incontinence   ? USI  ? Uterine prolapse   ? ? ?Past Surgical History:  ?Procedure Laterality Date  ? BLADDER SUSPENSION  2011  ? CATARACT EXTRACTION Bilateral 2015  ? Dr. Elmer Picker  ? CORONARY ANGIOPLASTY WITH STENT PLACEMENT  04/21/2010  ? LAD 80%, RCA 30%, nl EF, s/p DES LAD  ? EYE SURGERY    ? LEFT HEART CATH AND CORONARY ANGIOGRAPHY N/A 06/14/2017  ? Procedure: LEFT HEART CATH AND CORONARY ANGIOGRAPHY;  Surgeon: Runell Gess, MD;  Location: [facility];  Service:  Cardiovascular;  Laterality: N/A;  ? OOPHORECTOMY  2011  ? BSO  ? TOTAL HIP ARTHROPLASTY Right 06/10/2017  ? Procedure: RIGHT TOTAL HIP ARTHROPLASTY ANTERIOR APPROACH;  Surgeon: Samson Frederic, MD;  Location: [facility];  Service: Orthopedics;  Laterality: Right;  Needs RNFA  ? VAGINAL HYSTERECTOMY  2011  ? LAVH BSO; benign  ? ? ?Current Medications: ?Current Meds  ?Medication Sig  ? aspirin EC 81 MG tablet Take 81 mg by mouth daily.  ? Biotin w/ Vitamins C & E (HAIR/SKIN/NAILS PO) Take 1 tablet by mouth daily.  ? Cranberry 400 MG CAPS Take 400 mg by mouth daily.  ? famotidine (PEPCID) 20 MG tablet Take 1 tablet (20 mg total) by mouth daily.  ? hydrALAZINE (APRESOLINE) 10 MG tablet TAKE (1) TABLET TWICE A DAY. MAY TAKE EXTRA DOSE FOR SBP  >160 UP TO A TOTAL OF 4 TIMES A DAY. (Patient taking differently: TAKE (1) TABLET TWICE A DAY. MAY TAKE EXTRA DOSE FOR SBP >160 UP TO A TOTAL OF 4 TIMES A DAY. As Needed)  ? isosorbide mononitrate (IMDUR) 60 MG 24 hr tablet TAKE 1 & 1/2 TABLETS A DAY.  ? Multiple Vitamins-Calcium (ONE-A-DAY WOMENS PO) Take 1 tablet by mouth daily.   ? Multiple Vitamins-Minerals (PRESERVISION AREDS 2 PO) Take 1 capsule by mouth 2 (two) times daily.  ? nitrofurantoin, macrocrystal-monohydrate, (MACROBID) 100 MG capsule Take by mouth. Takes as Needed  ? nitroGLYCERIN (NITROSTAT) 0.4 MG SL tablet DISSOLVE 1 TABLET UNDER TONGUE IF NEEDED FOR CHEST PAIN. MAY REPEAT IN 5 MINUTES FOR 3 DOSES.  ? PRESCRIPTION MEDICATION Avastin eye injections given by Dr Vanessa Barbara due to macular degeneration  ? rosuvastatin (CRESTOR) 10 MG tablet TABLET ONE-HALF TABLET BY MOUTH DAILY  ? SYNTHROID 50 MCG tablet TAKE 1 TABLET EACH DAY.  ?  ? ?Allergies:   Acyclovir and related, Pravachol [pravastatin sodium], Sulfa antibiotics, and Zocor [simvastatin]  ? ?Social History  ? ?Socioeconomic History  ? Marital status: Widowed  ?  Spouse name: Not on file  ? Number of children: 2  ? Years of education: Not on file  ? Highest education level: Not on file  ?Occupational History  ? Occupation: retired  ?Tobacco Use  ? Smoking status: Never  ? Smokeless tobacco: Never  ?Vaping Use  ? Vaping Use: Never used  ?Substance and Sexual Activity  ? Alcohol use: No  ?  Comment: Rare  ? Drug use: No  ? Sexual activity: Never  ?  Birth control/protection: Surgical  ?Other Topics Concern  ? Not on file  ?Social History Narrative  ? Work or School:  none  ?   ? Home Situation: lives with husband  ?   ? Spiritual Beliefs: Methodist  ?   ? Lifestyle: regular exercise (yoga, water aerobics); diet is healthy  ?   ? ?Social Determinants of Health  ? ?Financial Resource Strain: Not on file  ?Food Insecurity: Not on file  ?Transportation Needs: Not on file  ?Physical Activity: Not on file   ?Stress: Not on file  ?Social Connections: Not on file  ?  ? ?Family History: ?The patient's family history includes Arthritis in her daughter; Breast cancer in her cousin, paternal aunt, and paternal aunt; Colon cancer (age of onset: 73) in her paternal aunt; Diabetes in her sister; Heart disease in her brother, father, mother, and sister; Hypertension in her mother; Leukemia in her paternal aunt; Lung cancer in her paternal grandfather and sister; Scoliosis in her sister; Uterine cancer in  her sister. There is no history of Esophageal cancer, Pancreatic cancer, or Stomach cancer. ? ?ROS:   ?Please see the history of present illness.    ? All other systems reviewed and are negative. ? ?EKGs/Labs/Other Studies Reviewed:   ? ?The following studies were reviewed today: ? ?Nuclear stress 10/27/21: ?  ECG is abnormal.  Baseline EKG showed junctional bradycardia. ?  No ST deviation was noted. ?  LV perfusion is normal. There is no evidence of ischemia. There is no evidence of infarction. ?  Left ventricular function is normal. Nuclear stress EF: 64 %. The left ventricular ejection fraction is normal (55-65%). End diastolic cavity size is normal. End systolic cavity size is normal. ?  Prior study available for comparison from 05/29/2020. ?  The study is normal. The study is low risk. ? ? ?Echo 10/27/21: ? 1. Left ventricular ejection fraction, by estimation, is 60 to 65%. Left  ?ventricular ejection fraction by 3D volume is 61 %. The left ventricle has  ?normal function. The left ventricle has no regional wall motion  ?abnormalities. Left ventricular diastolic  ? parameters are consistent with Grade I diastolic dysfunction (impaired  ?relaxation).  ? 2. Right ventricular systolic function is normal. The right ventricular  ?size is normal. There is normal pulmonary artery systolic pressure. The  ?estimated right ventricular systolic pressure is 19.5 mmHg.  ? 3. The mitral valve is normal in structure. Mild to moderate  mitral valve  ?regurgitation.  ? 4. The aortic valve is tricuspid. There is mild calcification of the  ?aortic valve. There is mild thickening of the aortic valve. Aortic valve  ?regurgitation is mild. Aortic

## 2021-11-13 ENCOUNTER — Ambulatory Visit: Payer: Medicare Other | Admitting: Physician Assistant

## 2021-11-13 ENCOUNTER — Encounter: Payer: Self-pay | Admitting: Physician Assistant

## 2021-11-13 VITALS — BP 120/60 | HR 44 | Ht 62.5 in | Wt 122.8 lb

## 2021-11-13 DIAGNOSIS — I340 Nonrheumatic mitral (valve) insufficiency: Secondary | ICD-10-CM

## 2021-11-13 DIAGNOSIS — E785 Hyperlipidemia, unspecified: Secondary | ICD-10-CM

## 2021-11-13 DIAGNOSIS — I2510 Atherosclerotic heart disease of native coronary artery without angina pectoris: Secondary | ICD-10-CM

## 2021-11-13 DIAGNOSIS — R011 Cardiac murmur, unspecified: Secondary | ICD-10-CM | POA: Diagnosis not present

## 2021-11-13 DIAGNOSIS — I10 Essential (primary) hypertension: Secondary | ICD-10-CM

## 2021-11-13 DIAGNOSIS — R079 Chest pain, unspecified: Secondary | ICD-10-CM | POA: Diagnosis not present

## 2021-11-13 DIAGNOSIS — R001 Bradycardia, unspecified: Secondary | ICD-10-CM

## 2021-11-13 NOTE — Patient Instructions (Signed)
Medication Instructions:  ?Your Physician recommend you continue on your current medication as directed.   ? ?*If you need a refill on your cardiac medications before your next appointment, please call your pharmacy* ? ? ?Follow-Up: ?At [facility], you and your health needs are our priority.  As part of our continuing mission to provide you with exceptional heart care, we have created designated Provider Care Teams.  These Care Teams include your primary Cardiologist (physician) and Advanced Practice Providers (APPs -  Physician Assistants and Nurse Practitioners) who all work together to provide you with the care you need, when you need it. ? ?We recommend signing up for the patient portal called "MyChart".  Sign up information is provided on this After Visit Summary.  MyChart is used to connect with patients for Virtual Visits (Telemedicine).  Patients are able to view lab/test results, encounter notes, upcoming appointments, etc.  Non-urgent messages can be sent to your provider as well.   ?To learn more about what you can do with MyChart, go to [email or web address].   ? ?Your next appointment:   ?6 month(s) ? ?The format for your next appointment:   ?In Person ? ?Provider:   ?Micah Flesher, PA-C     ?or Dr. Antoine Poche, MD ? ? ? ? ?

## 2021-12-02 NOTE — Progress Notes (Addendum)
?[facility] - Clinic Note ? ?12/08/2021 ? ?  ?CHIEF COMPLAINT ?Patient presents for Retina Follow Up ? ?HISTORY OF PRESENT ILLNESS: ?Kaitlyn Parks is a 87 y.o. female who presents to the clinic today for:  ? ?HPI   ? ? Retina Follow Up   ?Patient presents with  Wet AMD.  In left eye.  This started 7 weeks ago.  I, the attending physician,  performed the HPI with the patient and updated documentation appropriately. ? ?  ?  ? ? Comments   ?Patient here for 7 weeks retina follow up for exu ARMD OS. Patient states vision is ok. Distance is diminishing. Uses lubricating drops. No eye pain, just dryness.  ? ?  ?  ?Last edited by Rennis Chris, MD on 12/09/2021  1:36 PM.  ?  ?Pt states her distance vision is not as good as it was, she has a new rx for glasses, but she has not gotten new glasses yet ? ? ?Referring physician: ?Wynn Banker, MD ?[phone] ?[place],  [place] [address] ? ?HISTORICAL INFORMATION:  ?Selected notes from the MEDICAL RECORD NUMBER ?Referred by Dr. [place] DeMarco for concern of exu ARMD ?LEE: 11.26.19 (J. 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 ?CURRENT MEDICATIONS: ?No current outpatient medications on file. (Ophthalmic Drugs)  ? ?No current facility-administered medications for this visit. (Ophthalmic Drugs)  ? ?Current Outpatient Medications (Other)  ?Medication Sig  ? aspirin EC 81 MG tablet Take 81 mg by mouth daily.  ? Biotin w/ Vitamins C & E (HAIR/SKIN/NAILS PO) Take 1 tablet by mouth daily.  ? famotidine (PEPCID) 20 MG tablet Take 1 tablet (20 mg total) by mouth daily.  ? hydrALAZINE (APRESOLINE) 10 MG tablet TAKE (1) TABLET TWICE A DAY. MAY TAKE EXTRA DOSE FOR SBP >160 UP TO A TOTAL OF 4 TIMES A DAY. (Patient taking differently: TAKE (1) TABLET TWICE A DAY. MAY TAKE EXTRA DOSE FOR SBP >160 UP TO A TOTAL OF 4 TIMES A DAY. As Needed)  ? isosorbide mononitrate  (IMDUR) 60 MG 24 hr tablet TAKE 1 & 1/2 TABLETS A DAY.  ? Multiple Vitamins-Calcium (ONE-A-DAY WOMENS PO) Take 1 tablet by mouth daily.   ? Multiple Vitamins-Minerals (PRESERVISION AREDS 2 PO) Take 1 capsule by mouth 2 (two) times daily.  ? nitroGLYCERIN (NITROSTAT) 0.4 MG SL tablet DISSOLVE 1 TABLET UNDER TONGUE IF NEEDED FOR CHEST PAIN. MAY REPEAT IN 5 MINUTES FOR 3 DOSES.  ? PRESCRIPTION MEDICATION Avastin eye injections given by Dr Vanessa Barbara due to macular degeneration  ? rosuvastatin (CRESTOR) 10 MG tablet TABLET ONE-HALF TABLET BY MOUTH DAILY  ? SYNTHROID 50 MCG tablet TAKE 1 TABLET EACH DAY.  ? Cranberry 400 MG CAPS Take 400 mg by mouth daily.  ? nitrofurantoin, macrocrystal-monohydrate, (MACROBID) 100 MG capsule Take by mouth. Takes as Needed  ? ?No current facility-administered medications for this visit. (Other)  ? ?Facility-Administered Medications Ordered in Other Visits (Other)  ?Medication Route  ? technetium tetrofosmin (TC-MYOVIEW) injection 32.9 millicurie Intravenous  ? ?REVIEW OF SYSTEMS: ?ROS   ?Positive for: Gastrointestinal, Genitourinary, Musculoskeletal, Cardiovascular, Eyes ?Negative for: Constitutional, Neurological, Skin, HENT, Endocrine, Respiratory, Psychiatric, Allergic/Imm, Heme/Lymph ?Last edited by Laddie Aquas, COA on 12/08/2021  1:26 PM.  ?  ? ?ALLERGIES ?Allergies  ?Allergen Reactions  ? Acyclovir And Related Other (See Comments)  ?  unknown  ? Pravachol [Pravastatin Sodium] Other (See  Comments)  ?  cystitis  ? Sulfa Antibiotics   ?  nausea  ? Zocor [Simvastatin] Other (See Comments)  ?  cystitis  ? ?PAST MEDICAL HISTORY ?Past Medical History:  ?Diagnosis Date  ? Arthritis   ? DDD, scoliosis, sees Dr. Lovell Sheehan for this, uses norco very rarely for pain  ? Bradycardia 11/11/2017  ? CAD (coronary artery disease)   ? LAD stenting of a 90% lesion 2012  ? Cystocele   ? Educated about COVID-19 virus infection 12/06/2019  ? Elevated cholesterol   ? GERD (gastroesophageal reflux disease)   ?  dx in work up 2016 for atypical CP at [facility]   pt. denies  ? Heart murmur   ? Hypertensive retinopathy   ? OU  ? Hypothyroidism   ? Interstitial cystitis   ? sees Dr. [name]  ? Macular degeneration   ? OU  ? NSTEMI (non-ST elevated myocardial infarction) (HCC)   ? Osteoporosis   ? Rectocele   ? S/P hip replacement, right 06/12/2017  ? Scoliosis   ? Thyroid disease   ? Hypothyroid  ? Urinary incontinence   ? USI  ? Uterine prolapse   ? ?Past Surgical History:  ?Procedure Laterality Date  ? BLADDER SUSPENSION  2011  ? CATARACT EXTRACTION Bilateral 2015  ? Dr. Elmer Picker  ? CORONARY ANGIOPLASTY WITH STENT PLACEMENT  04/21/2010  ? LAD 80%, RCA 30%, nl EF, s/p DES LAD  ? EYE SURGERY    ? LEFT HEART CATH AND CORONARY ANGIOGRAPHY N/A 06/14/2017  ? Procedure: LEFT HEART CATH AND CORONARY ANGIOGRAPHY;  Surgeon: Runell Gess, MD;  Location: [facility];  Service: Cardiovascular;  Laterality: N/A;  ? OOPHORECTOMY  2011  ? BSO  ? TOTAL HIP ARTHROPLASTY Right 06/10/2017  ? Procedure: RIGHT TOTAL HIP ARTHROPLASTY ANTERIOR APPROACH;  Surgeon: Samson Frederic, MD;  Location: [facility];  Service: Orthopedics;  Laterality: Right;  Needs RNFA  ? VAGINAL HYSTERECTOMY  2011  ? LAVH BSO; benign  ? ?FAMILY HISTORY ?Family History  ?Problem Relation Age of Onset  ? Hypertension Mother   ? Heart disease Mother   ? Heart disease Father   ? Heart disease Sister   ? Diabetes Sister   ? Uterine cancer Sister   ?     mets to lungs  ? Lung cancer Sister   ? Scoliosis Sister   ? Heart disease Brother   ? Colon cancer Paternal Aunt 54  ? Breast cancer Paternal Aunt   ?     Age 24's  ? Breast cancer Paternal Aunt   ? Leukemia Paternal Aunt   ? Lung cancer Paternal Grandfather   ?     smoker  ? Arthritis Daughter   ? Breast cancer Cousin   ?     Maternal 1st cousins-Age 22's  ? Esophageal cancer Neg Hx   ? Pancreatic cancer Neg Hx   ? Stomach cancer Neg Hx   ? ?SOCIAL HISTORY ?Social History  ? ?Tobacco Use  ? Smoking status: Never  ? Smokeless  tobacco: Never  ?Vaping Use  ? Vaping Use: Never used  ?Substance Use Topics  ? Alcohol use: No  ?  Comment: Rare  ? Drug use: No  ?  ? ?  ?OPHTHALMIC EXAM: ?Base Eye Exam   ? ? Visual Acuity (Snellen - Linear)   ? ?   Right Left  ? Dist [place] 20/25 -2 20/40 -1  ? Dist ph [place] NI 20/30  ? ?  ?  ? ?  Tonometry (Tonopen, 1:23 PM)   ? ?   Right Left  ? Pressure 12 13  ? ?  ?  ? ? Pupils   ? ?   Dark Light Shape React APD  ? Right 3 2 Round Brisk None  ? Left 3 2 Round Brisk None  ? ?  ?  ? ? Visual Fields (Counting fingers)   ? ?   Left Right  ?  Full Full  ? ?  ?  ? ? Extraocular Movement   ? ?   Right Left  ?  Full, Ortho Full, Ortho  ? ?  ?  ? ? Neuro/Psych   ? ? Oriented x3: Yes  ? Mood/Affect: Normal  ? ?  ?  ? ? Dilation   ? ? Both eyes: 1.0% Mydriacyl, 2.5% Phenylephrine @ 1:23 PM  ? ?  ?  ? ?  ? ?Slit Lamp and Fundus Exam   ? ? Slit Lamp Exam   ? ?   Right Left  ? Lids/Lashes mild Telangiectasia, marginal leasion nasal UL Telangiectasia, Meibomian gland dysfunction, lower lid edema  ? Conjunctiva/Sclera White and quiet White and quiet, inferior conj chalasis  ? Cornea Arcus, 2+ Punctate epithelial erosions 3+ inferior punctate epithelial erosions, Arcus  ? Anterior Chamber Deep and clear Deep and clear  ? Iris Round and dilated Round and well dilated  ? Lens PC IOL in good position, trace Posterior capsular opacification (linear, extending just to visual axis) PC IOL in excellent position  ? Anterior Vitreous Vitreous syneresis, Posterior vitreous detachment Vitreous syneresis, Posterior vitreous detachment, vitreous condensations  ? ?  ?  ? ? Fundus Exam   ? ?   Right Left  ? Disc Pink and Sharp, Compact, focal PPP Pink and Sharp, mild temporal PPA  ? C/D Ratio 0.2 0.3  ? Macula Blunted foveal reflex, Drusen, RPE mottling and clumping, early Atrophy, +PEDs -- stably improved, stable improvement in central cyst / IRF, No heme Blunted foveal reflex, +drusen, pigment clumping, RPE mottling, clumping and early atrophy,  no heme, central PED / CNV with overlying cystic changes -- stably improved  ? Vessels attenuated, Tortuous attenuated, Tortuous  ? Periphery Attached, scattered reticular degeneration   Attached, scattered

## 2021-12-08 ENCOUNTER — Encounter (INDEPENDENT_AMBULATORY_CARE_PROVIDER_SITE_OTHER): Payer: Self-pay | Admitting: Ophthalmology

## 2021-12-08 ENCOUNTER — Ambulatory Visit (INDEPENDENT_AMBULATORY_CARE_PROVIDER_SITE_OTHER): Payer: Medicare Other | Admitting: Ophthalmology

## 2021-12-08 DIAGNOSIS — H353112 Nonexudative age-related macular degeneration, right eye, intermediate dry stage: Secondary | ICD-10-CM | POA: Diagnosis not present

## 2021-12-08 DIAGNOSIS — Z961 Presence of intraocular lens: Secondary | ICD-10-CM

## 2021-12-08 DIAGNOSIS — H35033 Hypertensive retinopathy, bilateral: Secondary | ICD-10-CM | POA: Diagnosis not present

## 2021-12-08 DIAGNOSIS — H04123 Dry eye syndrome of bilateral lacrimal glands: Secondary | ICD-10-CM

## 2021-12-08 DIAGNOSIS — I10 Essential (primary) hypertension: Secondary | ICD-10-CM

## 2021-12-08 DIAGNOSIS — H353221 Exudative age-related macular degeneration, left eye, with active choroidal neovascularization: Secondary | ICD-10-CM | POA: Diagnosis not present

## 2021-12-09 ENCOUNTER — Encounter (INDEPENDENT_AMBULATORY_CARE_PROVIDER_SITE_OTHER): Payer: Self-pay | Admitting: Ophthalmology

## 2021-12-09 MED ORDER — BEVACIZUMAB CHEMO INJECTION 1.25MG/0.05ML SYRINGE FOR KALEIDOSCOPE
1.2500 mg | INTRAVITREAL | Status: AC | PRN
  Administered 2021-12-09: 1.25 mg via INTRAVITREAL

## 2022-01-16 NOTE — Progress Notes (Signed)
[facility] Clinic Note  01/19/2022    CHIEF COMPLAINT Patient presents for Retina Follow Up  HISTORY OF PRESENT ILLNESS: Kaitlyn Parks is a 87 y.o. female who presents to the clinic today for:   HPI     Retina Follow Up   Patient presents with  Wet AMD.  In left eye.  This started 7 weeks ago.  I, the attending physician,  performed the HPI with the patient and updated documentation appropriately.        Comments   Patient here for 7 weeks retina follow up for exu ARMD OS. Patient states vision is declining gradually. Don't have a good RX for glasses. Cant read everything in paper. No eye pain.       Last edited by Bernarda Caffey, MD on 01/19/2022  5:26 PM.     Pt states    Referring physician: Caren Macadam, MD [place],  [place] [address]  HISTORICAL INFORMATION:  Selected notes from the MEDICAL RECORD NUMBER Referred by Dr. [place] DeMarco for concern of exu ARMD LEE: 11.26.19 (J. 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 (Ophthalmic Drugs)   No current facility-administered medications for this visit. (Ophthalmic Drugs)   Current Outpatient Medications (Other)  Medication Sig   aspirin EC 81 MG tablet Take 81 mg by mouth daily.   Biotin w/ Vitamins C & E (HAIR/SKIN/NAILS PO) Take 1 tablet by mouth daily.   Cranberry 400 MG CAPS Take 400 mg by mouth daily.   famotidine (PEPCID) 20 MG tablet Take 1 tablet (20 mg total) by mouth daily.   hydrALAZINE (APRESOLINE) 10 MG tablet TAKE (1) TABLET TWICE A DAY. MAY TAKE EXTRA DOSE FOR SBP >160 UP TO A TOTAL OF 4 TIMES A DAY. (Patient taking differently: TAKE (1) TABLET TWICE A DAY. MAY TAKE EXTRA DOSE FOR SBP >160 UP TO A TOTAL OF 4 TIMES A DAY. As Needed)   isosorbide mononitrate (IMDUR) 60 MG 24 hr tablet TAKE 1 & 1/2  TABLETS A DAY.   Multiple Vitamins-Calcium (ONE-A-DAY WOMENS PO) Take 1 tablet by mouth daily.    Multiple Vitamins-Minerals (PRESERVISION AREDS 2 PO) Take 1 capsule by mouth 2 (two) times daily.   nitroGLYCERIN (NITROSTAT) 0.4 MG SL tablet DISSOLVE 1 TABLET UNDER TONGUE IF NEEDED FOR CHEST PAIN. MAY REPEAT IN 5 MINUTES FOR 3 DOSES.   PRESCRIPTION MEDICATION Avastin eye injections given by Dr Coralyn Pear due to macular degeneration   rosuvastatin (CRESTOR) 10 MG tablet TABLET ONE-HALF TABLET BY MOUTH DAILY   SYNTHROID 50 MCG tablet TAKE 1 TABLET EACH DAY.   nitrofurantoin, macrocrystal-monohydrate, (MACROBID) 100 MG capsule Take by mouth. Takes as Needed   No current facility-administered medications for this visit. (Other)   Facility-Administered Medications Ordered in Other Visits (Other)  Medication Route   technetium tetrofosmin (TC-MYOVIEW) injection 99991111 millicurie Intravenous   REVIEW OF SYSTEMS: ROS   Positive for: Gastrointestinal, Genitourinary, Musculoskeletal, Cardiovascular, Eyes Negative for: Constitutional, Neurological, Skin, HENT, Endocrine, Respiratory, Psychiatric, Allergic/Imm, Heme/Lymph Last edited by Theodore Demark, COA on 01/19/2022  1:38 PM.     ALLERGIES Allergies  Allergen Reactions   Acyclovir And Related Other (See Comments)    unknown   Pravachol [Pravastatin Sodium] Other (See Comments)    cystitis   Sulfa Antibiotics     nausea   Zocor [Simvastatin] Other (  See Comments)    cystitis   PAST MEDICAL HISTORY Past Medical History:  Diagnosis Date   Arthritis    DDD, scoliosis, sees Dr. Arnoldo Morale for this, uses norco very rarely for pain   Bradycardia 11/11/2017   CAD (coronary artery disease)    LAD stenting of a 90% lesion 2012   Cystocele    Educated about COVID-19 virus infection 12/06/2019   Elevated cholesterol    GERD (gastroesophageal reflux disease)    dx in work up 2016 for atypical CP at OSH   pt. denies   Heart murmur    Hypertensive  retinopathy    OU   Hypothyroidism    Interstitial cystitis    sees Dr. Jeffie Pollock   Macular degeneration    OU   NSTEMI (non-ST elevated myocardial infarction) [facility])    Osteoporosis    Rectocele    S/P hip replacement, right 06/12/2017   Scoliosis    Thyroid disease    Hypothyroid   Urinary incontinence    USI   Uterine prolapse    Past Surgical History:  Procedure Laterality Date   BLADDER SUSPENSION  2011   CATARACT EXTRACTION Bilateral 2015   Dr. Herbert Deaner   CORONARY ANGIOPLASTY WITH STENT PLACEMENT  04/21/2010   LAD 80%, RCA 30%, nl EF, s/p DES LAD   EYE SURGERY     LEFT HEART CATH AND CORONARY ANGIOGRAPHY N/A 06/14/2017   Procedure: LEFT HEART CATH AND CORONARY ANGIOGRAPHY;  Surgeon: Lorretta Harp, MD;  Location: [facility];  Service: Cardiovascular;  Laterality: N/A;   OOPHORECTOMY  2011   BSO   TOTAL HIP ARTHROPLASTY Right 06/10/2017   Procedure: RIGHT TOTAL HIP ARTHROPLASTY ANTERIOR APPROACH;  Surgeon: Rod Can, MD;  Location: [facility];  Service: Orthopedics;  Laterality: Right;  Needs RNFA   VAGINAL HYSTERECTOMY  2011   LAVH BSO; benign   FAMILY HISTORY Family History  Problem Relation Age of Onset   Hypertension Mother    Heart disease Mother    Heart disease Father    Heart disease Sister    Diabetes Sister    Uterine cancer Sister        mets to lungs   Lung cancer Sister    Scoliosis Sister    Heart disease Brother    Colon cancer Paternal Aunt 57   Breast cancer Paternal 63        Age 65's   Breast cancer Paternal Aunt    Leukemia Paternal Aunt    Lung cancer Paternal Grandfather        smoker   Arthritis Daughter    Breast cancer Cousin        Maternal 1st cousins-Age 43's   Esophageal cancer Neg Hx    Pancreatic cancer Neg Hx    Stomach cancer Neg Hx    SOCIAL HISTORY Social History   Tobacco Use   Smoking status: Never   Smokeless tobacco: Never  Vaping Use   Vaping Use: Never used  Substance Use Topics   Alcohol use:  No    Comment: Rare   Drug use: No       OPHTHALMIC EXAM: Base Eye Exam     Visual Acuity (Snellen - Linear)       Right Left   Dist [place] 20/25 -1 20/40 -1   Dist ph [place] NI 20/40 +2    Correction: Glasses         Tonometry (Tonopen, 1:35 PM)  Right Left   Pressure 15 17         Pupils       Dark Light Shape React APD   Right 3 2 Round Brisk None   Left 3 2 Round Brisk None         Visual Fields (Counting fingers)       Left Right    Full Full         Extraocular Movement       Right Left    Full Full         Neuro/Psych     Oriented x3: Yes   Mood/Affect: Normal         Dilation     Both eyes: 1.0% Mydriacyl, 2.5% Phenylephrine @ 1:34 PM           Slit Lamp and Fundus Exam     Slit Lamp Exam       Right Left   Lids/Lashes mild Telangiectasia, marginal lesion nasal UL Telangiectasia, Meibomian gland dysfunction, lower lid edema   Conjunctiva/Sclera White and quiet White and quiet, inferior conj chalasis   Cornea Arcus, 2+ Punctate epithelial erosions, tear film debris 1-2+ inferior punctate epithelial erosions, Arcus, tear film debris   Anterior Chamber Deep and clear Deep and clear   Iris Round and dilated Round and well dilated   Lens PC IOL in good position, trace Posterior capsular opacification (linear, extending just to visual axis) PC IOL in excellent position   Anterior Vitreous Vitreous syneresis, Posterior vitreous detachment Vitreous syneresis, Posterior vitreous detachment, vitreous condensations         Fundus Exam       Right Left   Disc Pink and Sharp, Compact, focal PPP Pink and Sharp, mild temporal PPA/PPP   C/D Ratio 0.2 0.3   Macula Blunted foveal reflex, Drusen, RPE mottling and clumping, early Atrophy, +PEDs -- stably improved, stable improvement in central cyst / IRF, No heme Blunted foveal reflex, +drusen, pigment clumping, RPE mottling, clumping and atrophy, no heme, central PED / CNV with overlying  cystic changes -- stably improved   Vessels attenuated, Tortuous attenuated, Tortuous   Periphery Attached, scattered reticular degeneration   Attached, scattered reticular degeneration           IMAGING AND PROCEDURES  Imaging and Procedures for @TODAY @  OCT, Retina - OU - Both Eyes       Right Eye Quality was good. Central Foveal Thickness: 241. Progression has been stable. Findings include no IRF, no SRF, abnormal foveal contour, retinal drusen , intraretinal hyper-reflective material, pigment epithelial detachment, outer retinal atrophy (Stable improvement in IRF/cystic changes, +punctate IRHM - stably improved).   Left Eye Quality was good. Central Foveal Thickness: 247. Progression has been stable. Findings include normal foveal contour, no IRF, no SRF, retinal drusen , intraretinal hyper-reflective material, pigment epithelial detachment, outer retinal atrophy (stable improvement in IRF/cystic changes ).   Notes *Images captured and stored on drive  Diagnosis / Impression:  OD: exudative ARMD -- Stable improvement in IRF/cystic changes, +punctate IRHM -- stably improved OS: exu-ARMD -- stable improvement in IRF/cystic changes   Clinical management:  See below  Abbreviations: NFP - Normal foveal profile. CME - cystoid macular edema. PED - pigment epithelial detachment. IRF - intraretinal fluid. SRF - subretinal fluid. EZ - ellipsoid zone. ERM - epiretinal membrane. ORA - outer retinal atrophy. ORT - outer retinal tubulation. SRHM - subretinal hyper-reflective material      Intravitreal Injection, Pharmacologic Agent -  OS - Left Eye       Time Out 01/19/2022. 2:21 PM. Confirmed correct patient, procedure, site, and patient consented.   Anesthesia Topical anesthesia was used. Anesthetic medications included Lidocaine 2%, Proparacaine 0.5%.   Procedure Preparation included 5% betadine to ocular surface, eyelid speculum. A supplied needle was used.   Injection: 1.25  mg Bevacizumab 1.25mg /0.41ml   Route: Intravitreal, Site: Left Eye   NDC: 81856-314-97, Lot: [id number] , Expiration date: 02/28/2022   Post-op Post injection exam found visual acuity of at least counting fingers. The patient tolerated the procedure well. There were no complications. The patient received written and verbal post procedure care education. Post injection medications were not given.            ASSESSMENT/PLAN:    ICD-10-CM   1. Exudative age-related macular degeneration of left eye with active choroidal neovascularization (HCC)  H35.3221 OCT, Retina - OU - Both Eyes    Intravitreal Injection, Pharmacologic Agent - OS - Left Eye    Bevacizumab (AVASTIN) SOLN 1.25 mg    2. Intermediate stage nonexudative age-related macular degeneration of right eye  H35.3112     3. Essential hypertension  I10     4. Hypertensive retinopathy of both eyes  H35.033     5. Pseudophakia of both eyes  Z96.1     6. Dry eyes  H04.123       1. Exudative age-related macular degeneration, left eye   - OCT 4.30.2020 showed interval conversion of OS from nonexudative ARMD to exu-ARMD with large dome-shaped PED with overlying IRF/SRF  - FA 05.28.20 -- no active CNV OS, just staining  - s/p IVA OS #1 (04.30.20), #2 (05.28.20), #3 (06.26.20), #4 (08.03.20), #5 (09.11.20), #6 (10.19.20), #7 (11.23.20),  - reactivation of CNV noted on 05.05.22 -- s/p IVA OS  #8 (05.05.22), #9 (06.06.22), #10 (07.19.22), #11 (09.13.22), #12 (10.25.22), #13 (12.13.22), #14 (01.30.23), #15 (3.20.23), #16 (05.08.23)  - **Interval increase in IRF overlying PED at 8 wks on 09.13.22 visit**  - BCVA decreased to 20/40 from 20/30  - OCT shows OD: stable improvement in IRF/cystic changes at 7 wks  - recommend IVA OS #17 today, 06.19.23 with f/u at 7 weeks (may retry ext to 8 wks next visit)  - pt wishes to proceed with injection  - RBA of procedure discussed, questions answered  - IVA informed consent obtained and re-signed,  05.08.23 (OS)  - see procedure note  - benefits investigation for Eylea initiated 4.30.2020 -- approved for 2022  - f/u in 7 wks -- DFE/OCT, possible injection -- consider retrial of ext to 8 wks  2. Age related macular degeneration, exudative, OD  - interval development of IRF first noted on 09.11.20 -- conversion from nonexudative to exudative ARMD  - pt initially presented on 11.27.19 due to alert from [facility] home monitoring system for OD  - [facility] prescribed by Dr. 11.29.19  - FA (5.28.2020) showed staining / window defect corresponding temporal RPE changes OU -- no active CNV OU  - S/P IVA OD #1 (09.11.20), #2 (10.19.20), #3 (11.23.20), #4 (01.07.21), #5 (2.19.21), #6 (04.02.21), #7 (05.28.21), #8 (07.23.21), #9 (09.24.21), #10 (11.29.21), #11 (02.11.22), #12 (05.05.22), #13 (7.19.22), #14 (09.13.22)  - benefits investigation for Eylea initiated 4.30.2020 -- approved for 2022  - OCT shows OD: Stable improvement in IRF/cystic changes  - BCVA stable at 20/30   - will hold IVA OD again today -- will treat prn  - f/u 7 weeks DFE, OCT  3,4. Hypertensive  retinopathy OU  - discussed importance of tight BP control  - monitor  5. Pseudophakia OU  - s/p CE/IOL OU  - beautiful surgeries, doing well  - monitor  6. Dry eyes OU  - recommend artificial tears and lubricating ointment as needed   Ophthalmic Meds Ordered this visit:  Meds ordered this encounter  Medications   Bevacizumab (AVASTIN) SOLN 1.25 mg     Return in about 7 weeks (around 03/09/2022) for f/u exu ARMD OS, DFE, OCT.  There are no Patient Instructions on file for this visit.  This document serves as a record of services personally performed by Gardiner Sleeper, MD, PhD. It was created on their behalf by Roselee Nova, COMT. The creation of this record is the provider's dictation and/or activities during the visit.  Electronically signed by: Roselee Nova, COMT 01/19/22 5:28 PM  This document serves as a record of  services personally performed by Gardiner Sleeper, MD, PhD. It was created on their behalf by San Jetty. Owens Shark, OA an ophthalmic technician. The creation of this record is the provider's dictation and/or activities during the visit.    Electronically signed by: San Jetty. Owens Shark, [place] 06.19.2023 5:28 PM   Gardiner Sleeper, M.D., Ph.D. Diseases & Surgery of the Retina and Vitreous [facility]  I have reviewed the above documentation for accuracy and completeness, and I agree with the above. Gardiner Sleeper, M.D., Ph.D. 01/19/22 5:29 PM   Abbreviations: M myopia (nearsighted); A astigmatism; H hyperopia (farsighted); P presbyopia; Mrx spectacle prescription;  CTL contact lenses; OD right eye; OS left eye; OU both eyes  XT exotropia; ET esotropia; PEK punctate epithelial keratitis; PEE punctate epithelial erosions; DES dry eye syndrome; MGD meibomian gland dysfunction; ATs artificial tears; PFAT's preservative free artificial tears; [place] nuclear sclerotic cataract; PSC posterior subcapsular cataract; ERM epi-retinal membrane; PVD posterior vitreous detachment; RD retinal detachment; DM diabetes mellitus; DR diabetic retinopathy; NPDR non-proliferative diabetic retinopathy; PDR proliferative diabetic retinopathy; CSME clinically significant macular edema; DME diabetic macular edema; dbh dot blot hemorrhages; CWS cotton wool spot; POAG primary open angle glaucoma; C/D cup-to-disc ratio; HVF humphrey visual field; GVF goldmann visual field; OCT optical coherence tomography; IOP intraocular pressure; BRVO Branch retinal vein occlusion; CRVO central retinal vein occlusion; CRAO central retinal artery occlusion; BRAO branch retinal artery occlusion; RT retinal tear; SB scleral buckle; PPV pars plana vitrectomy; VH Vitreous hemorrhage; PRP panretinal laser photocoagulation; IVK intravitreal kenalog; VMT vitreomacular traction; MH Macular hole;  NVD neovascularization of the disc; NVE  neovascularization elsewhere; AREDS age related eye disease study; ARMD age related macular degeneration; POAG primary open angle glaucoma; EBMD epithelial/anterior basement membrane dystrophy; ACIOL anterior chamber intraocular lens; IOL intraocular lens; PCIOL posterior chamber intraocular lens; Phaco/IOL phacoemulsification with intraocular lens placement; [place] photorefractive keratectomy; LASIK laser assisted in situ keratomileusis; HTN hypertension; DM diabetes mellitus; COPD chronic obstructive pulmonary disease

## 2022-01-19 ENCOUNTER — Ambulatory Visit (INDEPENDENT_AMBULATORY_CARE_PROVIDER_SITE_OTHER): Payer: Medicare Other | Admitting: Ophthalmology

## 2022-01-19 ENCOUNTER — Encounter (INDEPENDENT_AMBULATORY_CARE_PROVIDER_SITE_OTHER): Payer: Self-pay | Admitting: Ophthalmology

## 2022-01-19 DIAGNOSIS — H35033 Hypertensive retinopathy, bilateral: Secondary | ICD-10-CM

## 2022-01-19 DIAGNOSIS — I10 Essential (primary) hypertension: Secondary | ICD-10-CM | POA: Diagnosis not present

## 2022-01-19 DIAGNOSIS — H04123 Dry eye syndrome of bilateral lacrimal glands: Secondary | ICD-10-CM

## 2022-01-19 DIAGNOSIS — H353112 Nonexudative age-related macular degeneration, right eye, intermediate dry stage: Secondary | ICD-10-CM | POA: Diagnosis not present

## 2022-01-19 DIAGNOSIS — H353221 Exudative age-related macular degeneration, left eye, with active choroidal neovascularization: Secondary | ICD-10-CM

## 2022-01-19 DIAGNOSIS — Z961 Presence of intraocular lens: Secondary | ICD-10-CM

## 2022-01-19 MED ORDER — BEVACIZUMAB CHEMO INJECTION 1.25MG/0.05ML SYRINGE FOR KALEIDOSCOPE
1.2500 mg | INTRAVITREAL | Status: AC | PRN
  Administered 2022-01-19: 1.25 mg via INTRAVITREAL

## 2022-02-09 ENCOUNTER — Telehealth: Payer: Self-pay | Admitting: Cardiology

## 2022-02-09 NOTE — Telephone Encounter (Signed)
Spoke with pt this morning who states that last night after having fish for dinner she took her blood pressure 196/99. Over time she took a total of 80mg  of hydralazine which did not lower her pressure the way it has in the past. Pt then took an additional 5mg  of amlodipine which got her blood pressure down to 119/79. At the time of the increased blood pressure pt states that she had tightness in her back and tingling down her left arm. Pt has no back or arm discomfort at this time. Pt states that her prescription for hydralazine has expired and she is hoping to have an office visit to discuss this episode and possible refill hydralazine prescription. Office visit made with Dr. on 7/13. Pt verbalizes understanding.

## 2022-02-09 NOTE — Telephone Encounter (Signed)
Pt c/o BP issue: STAT if pt c/o blurred vision, one-sided weakness or slurred speech  1. What are your last 5 BP readings? 196/99 9 pm, gradually took 70 mg of hydralazine which lowered her BP to 170, then took 5 mg of amlodipine at 5 pm lowered to 119/79  2. Are you having any other symptoms (ex. Dizziness, headache, blurred vision, passed out)? no  3. What is your BP issue? Patient states her BP was very high last night. She also says she had pain in her back and tingling in left arm  Pt c/o of Chest Pain: STAT if CP now or developed within 24 hours  1. Are you having CP right now? No  2. Are you experiencing any other symptoms (ex. SOB, nausea, vomiting, sweating)? no  3. How long have you been experiencing CP? 9 pm last night  4. Is your CP continuous or coming and going? Came and went  5. Have you taken Nitroglycerin? No   ?

## 2022-02-11 NOTE — Progress Notes (Unsigned)
Cardiology Office Note   Date:  02/12/2022   ID:  Marisela, Line September 19, 1934, MRN [id number]  PCP:  Wynn Banker, MD (Inactive)  Cardiologist:   Rollene Rotunda, MD   Chief Complaint  Patient presents with   Coronary Artery Disease     History of Present Illness: Kaitlyn Parks is a 87 y.o. female who presents for followup up of CAD.  She had a PCI of LAD in 2012. She recently underwent total right hip arthroplasty. She presented to the hospital on 06/12/2017 with sudden onset of right-sided neck pain with radiation to the right jaw. CTA of the chest was negative for PE however troponin was elevated at 0.42. Echocardiogram was obtained on 06/13/2017 showed EF 60-65%, mild LVH, grade 1 DD, mild MR, mild TR, peak PA pressure 30 mmHg. Cardiac catheterization performed on 06/14/2017 showed 95% ostial D1 lesion, widely patent ostial LAD stent, 30% proximal LAD stenosis. Medical therapy was recommended. Imdur 60 mg daily was added to her medical regimen. Beta blocker was initially increased and later reduced due to symptomatic bradycardia. At a previous visit I stopped the beta blocker.   She had chest pain recently and had a negative perfusion study.    Since I last saw her she called recently with chest pain and an episode of HTN.  Her BP was systolic 190.  She gave me a detailed description of what her blood pressure was doing.  She been out to dinner.  She developed some discomfort in her back after she got home and she said she had a salty meal.  She noticed her blood pressure to be 196/99.  She ended up over the next several hours she said until 5 in the morning taking 10 mg of hydralazine 8 times.  She said she finally took an expired amlodipine and her blood pressure came down.  She shows me lots of readings where her blood pressure was in the 160s systolic until it finally came down.  Over these last few days she has had 1 episode of her blood pressure being 96/61 after she did  some water aerobics.  She otherwise seems like her blood pressure has been hovering in the systolics 150s.  She denies any chest pressure, neck or arm discomfort.  She has not had any new dizziness, presyncope or syncope.  She has not had any PND or orthopnea.   Past Medical History:  Diagnosis Date   Arthritis    DDD, scoliosis, sees Dr. Lovell Sheehan for this, uses norco very rarely for pain   Bradycardia 11/11/2017   CAD (coronary artery disease)    LAD stenting of a 90% lesion 2012   Cystocele    Educated about COVID-19 virus infection 12/06/2019   Elevated cholesterol    GERD (gastroesophageal reflux disease)    dx in work up 2016 for atypical CP at OSH   pt. denies   Heart murmur    Hypertensive retinopathy    OU   Hypothyroidism    Interstitial cystitis    sees Dr. [name]   Macular degeneration    OU   NSTEMI (non-ST elevated myocardial infarction) [facility])    Osteoporosis    Rectocele    S/P hip replacement, right 06/12/2017   Scoliosis    Thyroid disease    Hypothyroid   Urinary incontinence    USI   Uterine prolapse     Past Surgical History:  Procedure Laterality Date   BLADDER SUSPENSION  2011  CATARACT EXTRACTION Bilateral 2015   Dr. Elmer Picker   CORONARY ANGIOPLASTY WITH STENT PLACEMENT  04/21/2010   LAD 80%, RCA 30%, nl EF, s/p DES LAD   EYE SURGERY     LEFT HEART CATH AND CORONARY ANGIOGRAPHY N/A 06/14/2017   Procedure: LEFT HEART CATH AND CORONARY ANGIOGRAPHY;  Surgeon: Runell Gess, MD;  Location: [facility];  Service: Cardiovascular;  Laterality: N/A;   OOPHORECTOMY  2011   BSO   TOTAL HIP ARTHROPLASTY Right 06/10/2017   Procedure: RIGHT TOTAL HIP ARTHROPLASTY ANTERIOR APPROACH;  Surgeon: Samson Frederic, MD;  Location: [facility];  Service: Orthopedics;  Laterality: Right;  Needs RNFA   VAGINAL HYSTERECTOMY  2011   LAVH BSO; benign     Current Outpatient Medications  Medication Sig Dispense Refill   amLODipine (NORVASC) 2.5 MG tablet Take 1 tablet  (2.5 mg total) by mouth daily. 90 tablet 3   aspirin EC 81 MG tablet Take 81 mg by mouth daily.     Biotin w/ Vitamins C & E (HAIR/SKIN/NAILS PO) Take 1 tablet by mouth daily.     Cranberry 400 MG CAPS Take 400 mg by mouth daily.     cycloSPORINE (RESTASIS) 0.05 % ophthalmic emulsion      famotidine (PEPCID) 20 MG tablet Take 1 tablet (20 mg total) by mouth daily. 30 tablet 0   hydrALAZINE (APRESOLINE) 10 MG tablet TAKE (1) TABLET TWICE A DAY. MAY TAKE EXTRA DOSE FOR SBP >160 UP TO A TOTAL OF 4 TIMES A DAY. (Patient taking differently: TAKE (1) TABLET TWICE A DAY. MAY TAKE EXTRA DOSE FOR SBP >160 UP TO A TOTAL OF 4 TIMES A DAY. As Needed) 180 tablet 3   isosorbide mononitrate (IMDUR) 60 MG 24 hr tablet TAKE 1 & 1/2 TABLETS A DAY. 135 tablet 1   Multiple Vitamins-Calcium (ONE-A-DAY WOMENS PO) Take 1 tablet by mouth daily.      nitrofurantoin, macrocrystal-monohydrate, (MACROBID) 100 MG capsule Take by mouth. Takes as Needed     nitroGLYCERIN (NITROSTAT) 0.4 MG SL tablet DISSOLVE 1 TABLET UNDER TONGUE IF NEEDED FOR CHEST PAIN. MAY REPEAT IN 5 MINUTES FOR 3 DOSES. 25 tablet 3   PRESCRIPTION MEDICATION Avastin eye injections given by Dr Vanessa Barbara due to macular degeneration     rosuvastatin (CRESTOR) 10 MG tablet TABLET ONE-HALF TABLET BY MOUTH DAILY 45 tablet 3   SYNTHROID 50 MCG tablet TAKE 1 TABLET EACH DAY. 90 tablet 1   No current facility-administered medications for this visit.   Facility-Administered Medications Ordered in Other Visits  Medication Dose Route Frequency Provider Last Rate Last Admin   technetium tetrofosmin (TC-MYOVIEW) injection 32.9 millicurie  32.9 millicurie Intravenous Once PRN Turner, Cornelious Bryant, MD        Allergies:   Acyclovir and related, Pravachol [pravastatin sodium], Sulfa antibiotics, and Zocor [simvastatin]     ROS:  Please see the history of present illness.   Otherwise, review of systems are positive for none.   All other systems are reviewed and negative.     PHYSICAL EXAM: VS:  Ht 5\' 3"  (1.6 m)   Wt 124 lb (56.2 kg)   SpO2 96%   BMI 21.97 kg/m  , BMI Body mass index is 21.97 kg/m. GENERAL:  Well appearing NECK:  No jugular venous distention, waveform within normal limits, carotid upstroke brisk and symmetric, no bruits, no thyromegaly LUNGS:  Clear to auscultation bilaterally CHEST:  Unremarkable HEART:  PMI not displaced or sustained,S1 and S2 within normal limits,  no S3, no S4, no clicks, no rubs, no murmurs ABD:  Flat, positive bowel sounds normal in frequency in pitch, no bruits, no rebound, no guarding, no midline pulsatile mass, no hepatomegaly, no splenomegaly EXT:  2 plus pulses throughout, no edema, no cyanosis no clubbing  EKG:  EKG is not ordered today.   Recent Labs: 08/13/2021: ALT 12; TSH 0.91 09/30/2021: BUN 13; Creatinine, Ser 0.71; Hemoglobin 12.3; Platelets 264; Potassium 3.4; Sodium 136    Lipid Panel    Component Value Date/Time   CHOL 159 03/05/2021 0811   CHOL 160 02/05/2017 0834   TRIG 92.0 03/05/2021 0811   HDL 61.10 03/05/2021 0811   HDL 52 02/05/2017 0834   CHOLHDL 3 03/05/2021 0811   VLDL 18.4 03/05/2021 0811   LDLCALC 80 03/05/2021 0811   LDLCALC 72 03/04/2020 0828   LDLDIRECT 140.6 05/25/2012 0855      Wt Readings from Last 3 Encounters:  02/12/22 124 lb (56.2 kg)  11/13/21 122 lb 12.8 oz (55.7 kg)  10/22/21 122 lb (55.3 kg)      Other studies Reviewed: Additional studies/ records that were reviewed today include: None Review of the above records demonstrates:    See elsewhere   ASSESSMENT AND PLAN:    CAD, NATIVE VESSEL -  The patient has no new sypmtoms.  No further cardiovascular testing is indicated.  We will continue with aggressive risk reduction and meds as listed.  HYPERTENSION -  I am going to restart amlodipine at 2.5 mg daily.  She will keep a blood pressure diary and continue to use the as needed hydralazine.  She will let me know if she needs further up titration.     Current medicines are reviewed at length with the patient today.  The patient does not have concerns regarding medicines.  The following changes have been made:   None  Labs/ tests ordered today include:   No  No orders of the defined types were placed in this encounter.     Disposition:   FU with me in 6 months   Signed, Rollene Rotunda, MD  02/12/2022 1:01 PM    [facility]

## 2022-02-12 ENCOUNTER — Ambulatory Visit: Payer: Medicare Other | Admitting: Cardiology

## 2022-02-12 ENCOUNTER — Encounter: Payer: Self-pay | Admitting: Cardiology

## 2022-02-12 VITALS — Ht 63.0 in | Wt 124.0 lb

## 2022-02-12 DIAGNOSIS — E785 Hyperlipidemia, unspecified: Secondary | ICD-10-CM

## 2022-02-12 DIAGNOSIS — I10 Essential (primary) hypertension: Secondary | ICD-10-CM

## 2022-02-12 DIAGNOSIS — I2510 Atherosclerotic heart disease of native coronary artery without angina pectoris: Secondary | ICD-10-CM

## 2022-02-12 MED ORDER — AMLODIPINE BESYLATE 2.5 MG PO TABS: 2.5000 mg | ORAL_TABLET | Freq: Every day | ORAL | 3 refills | Status: DC

## 2022-02-12 NOTE — Patient Instructions (Signed)
Medication Instructions:   START AMLODIPINE 2.5 MG ONCE DAILY  *If you need a refill on your cardiac medications before your next appointment, please call your pharmacy*   Follow-Up: At [facility], you and your health needs are our priority.  As part of our continuing mission to provide you with exceptional heart care, we have created designated Provider Care Teams.  These Care Teams include your primary Cardiologist (physician) and Advanced Practice Providers (APPs -  Physician Assistants and Nurse Practitioners) who all work together to provide you with the care you need, when you need it.  We recommend signing up for the patient portal called "MyChart".  Sign up information is provided on this After Visit Summary.  MyChart is used to connect with patients for Virtual Visits (Telemedicine).  Patients are able to view lab/test results, encounter notes, upcoming appointments, etc.  Non-urgent messages can be sent to your provider as well.   To learn more about what you can do with MyChart, go to [email or web address].    Your next appointment:   6 month(s)  The format for your next appointment:   In Person  Provider:   Rollene Rotunda, MD      Important Information About Sugar

## 2022-02-19 ENCOUNTER — Ambulatory Visit: Payer: Medicare Other | Admitting: Physical Therapy

## 2022-03-03 ENCOUNTER — Encounter: Payer: Self-pay | Admitting: Physical Therapy

## 2022-03-03 ENCOUNTER — Ambulatory Visit (INDEPENDENT_AMBULATORY_CARE_PROVIDER_SITE_OTHER): Payer: Medicare Other | Admitting: Physical Therapy

## 2022-03-03 DIAGNOSIS — G8929 Other chronic pain: Secondary | ICD-10-CM | POA: Diagnosis not present

## 2022-03-03 DIAGNOSIS — M25562 Pain in left knee: Secondary | ICD-10-CM | POA: Diagnosis not present

## 2022-03-03 DIAGNOSIS — M6281 Muscle weakness (generalized): Secondary | ICD-10-CM

## 2022-03-03 DIAGNOSIS — M533 Sacrococcygeal disorders, not elsewhere classified: Secondary | ICD-10-CM | POA: Diagnosis not present

## 2022-03-03 NOTE — Therapy (Signed)
OUTPATIENT PHYSICAL THERAPY THORACOLUMBAR EVALUATION   Patient Name: Kaitlyn Parks MRN: [id number] DOB:[birth year], 87 y.o., female Today's Date: 03/03/2022   PT End of Session - 03/03/22 1214     Visit Number 1    Number of Visits 12    Date for PT Re-Evaluation 05/12/22    Authorization Type [employer]    Progress Note Due on Visit 10    PT Start Time 1216    PT Stop Time 1300    PT Time Calculation (min) 44 min             Past Medical History:  Diagnosis Date   Arthritis    DDD, scoliosis, sees Dr. Arnoldo Morale for this, uses norco very rarely for pain   Bradycardia 11/11/2017   CAD (coronary artery disease)    LAD stenting of a 90% lesion 2012   Cystocele    Educated about COVID-19 virus infection 12/06/2019   Elevated cholesterol    GERD (gastroesophageal reflux disease)    dx in work up 2016 for atypical CP at OSH   pt. denies   Heart murmur    Hypertensive retinopathy    OU   Hypothyroidism    Interstitial cystitis    sees Dr. Jeffie Pollock   Macular degeneration    OU   NSTEMI (non-ST elevated myocardial infarction) [facility])    Osteoporosis    Rectocele    S/P hip replacement, right 06/12/2017   Scoliosis    Thyroid disease    Hypothyroid   Urinary incontinence    USI   Uterine prolapse    Past Surgical History:  Procedure Laterality Date   BLADDER SUSPENSION  2011   CATARACT EXTRACTION Bilateral 2015   Dr. Herbert Deaner   CORONARY ANGIOPLASTY WITH STENT PLACEMENT  04/21/2010   LAD 80%, RCA 30%, nl EF, s/p DES LAD   EYE SURGERY     LEFT HEART CATH AND CORONARY ANGIOGRAPHY N/A 06/14/2017   Procedure: LEFT HEART CATH AND CORONARY ANGIOGRAPHY;  Surgeon: Lorretta Harp, MD;  Location: [place] [facility];  Service: Cardiovascular;  Laterality: N/A;   OOPHORECTOMY  2011   BSO   TOTAL HIP ARTHROPLASTY Right 06/10/2017   Procedure: RIGHT TOTAL HIP ARTHROPLASTY ANTERIOR APPROACH;  Surgeon: Rod Can, MD;  Location: [facility];  Service: Orthopedics;  Laterality: Right;  Needs  RNFA   VAGINAL HYSTERECTOMY  2011   LAVH BSO; benign   Patient Active Problem List   Diagnosis Date Noted   Aortic atherosclerosis ([place]) 12/07/2019   Macular degeneration 01/11/2019   Coronary artery disease involving native coronary artery of native heart without angina pectoris 10/29/2018   Presence of intraocular lens 09/23/2018   Bradycardia 11/11/2017   Dyslipidemia 11/11/2017   Pain in joint of right hip 08/04/2017   CAD (coronary artery disease) 06/12/2017   GERD (gastroesophageal reflux disease) 06/12/2017   S/P hip replacement, right 06/12/2017   Osteoarthritis of right hip 06/10/2017   CAD S/P percutaneous coronary angioplasty 02/11/2015   Renal insufficiency 02/11/2015   Prolonged Q-T interval on ECG 04/18/2010   Hypertension 08/11/2007   Hypothyroidism (acquired) 06/08/2007   MITRAL VALVE PROLAPSE 06/08/2007    PCP: [name], MD  REFERRING PROVIDER: Reece Agar  REFERRING DIAG: Sacroiliitis M46.1  Rationale for Evaluation and Treatment Rehabilitation  THERAPY DIAG:  Sacroiliac joint dysfunction  Muscle weakness (generalized)  Chronic pain of left knee  ONSET DATE: 4 years   SUBJECTIVE:  SUBJECTIVE STATEMENT:  Patient complains of chronic left SIJ pain that has been managed with quarterly injections. Reports she also has left knee pain that wakes her up at night and this is her biggest complaint. States that she also uses heat and ice and this helps. She is active in water classes Tues/Thurs in the AM and would liek to go back to yoga.   PERTINENT HISTORY:  Scoliosis, R THA 5 years ago, history of cardiac issues  PAIN:  Are you having pain? Yes: NPRS scale: 2, 8 at night/10 Pain location: left knee pain Pain description: throbbing Aggravating factors:  aggressive aquatic routine, long distance walking, grocery store Relieving factors: ice, rest   PRECAUTIONS: None  WEIGHT BEARING RESTRICTIONS No  FALLS:  Has patient fallen in last 6 months? No  LIVING ENVIRONMENT: Lives with: lives alone Lives in: House/apartment   OCCUPATION: not working, likes to carden in her potted plants   PLOF: Independent  PATIENT GOALS to not have left knee pain. To go back to yoga (stopped 7 years ago)   OBJECTIVE:   DIAGNOSTIC FINDINGS:  None recent at [facility]   SCREENING FOR RED FLAGS: Bowel or bladder incontinence: No Spinal tumors: No Cauda equina syndrome: No Compression fracture: No Abdominal aneurysm: No  COGNITION:  Overall cognitive status: Within functional limits for tasks assessed       MUSCLE LENGTH: Hamstrings: Right 30 deg; Left 30 deg    POSTURE: S curve in spine, right hip higher than left, forward head.   PALPATION: Tenderness to palpation along glute meds and lumbar paraspinals   LE Measurements Lower Extremity Right 03/03/2022 Left 03/03/2022   A/PROM MMT A/PROM MMT  Hip Flexion 105 4 90 4-  Hip Extension      Hip Abduction      Hip Adduction      Hip Internal rotation 45  5*   Hip External rotation 40  10   Knee Flexion 120 4 120* 4-  Knee Extension 0 4 8 lacking extension 4-  Ankle Dorsiflexion      Ankle Plantarflexion      Ankle Inversion      Ankle Eversion       (Blank rows = not tested)  * pain   LUMBAR SPECIAL TESTS:  Positive ely's test on left       TODAY'S TREATMENT  03/03/2022  Therapeutic Exercise:  Aerobic: Supine: hip IR x15 5" holds B, hip ER bent knee fall out x15 5" holds B Prone:  Seated:  Standing: Neuromuscular Re-education: Manual Therapy: Therapeutic Activity: Self Care: Trigger Point Dry Needling:  Modalities:    PATIENT EDUCATION:  Education details: on current presentation, on HEP, on clinical outcomes score and POC Person educated: Patient Education  method: Explanation, Demonstration, and Handouts Education comprehension: verbalized understanding  HOME EXERCISE PROGRAM: [id number]  ASSESSMENT:  CLINICAL IMPRESSION: Patient complains of left SIJ and left knee pain that started years ago. SIJ pain is managed by injections and this will also help with the knee pain. Patient limited in left hip ROM in all directions with pain in knee presenting with left hip IR. Educated patient on findings and POC moving forward. Patient would greatly benefit from skilled PT to improve overall function and QOL.    OBJECTIVE IMPAIRMENTS Abnormal gait, decreased activity tolerance, decreased balance, decreased mobility, difficulty walking, decreased ROM, decreased strength, postural dysfunction, and pain.   ACTIVITY LIMITATIONS sleeping and locomotion level  PARTICIPATION LIMITATIONS: shopping, community activity, and yard work  PERSONAL  FACTORS Age and 1-2 comorbidities: chronic left SIJ pain, R THA   are also affecting patient's functional outcome.   REHAB POTENTIAL: Good  CLINICAL DECISION MAKING: Stable/uncomplicated  EVALUATION COMPLEXITY: Low  GOALS: Goals reviewed with patient?  yes  SHORT TERM GOALS:  Patient will be independent in self management strategies to improve quality of life and functional outcomes. Baseline: new program Target date: 04/07/2022 Goal status: INITIAL  2.  Patient will report at least 50% improvement in overall symptoms and/or function to demonstrate improved functional mobility Baseline: 0% Target date: 04/07/2022 Goal status: INITIAL  3.  Patient will be able to report sleeping through the night without waking up secondary to pain in her left knee to improve sleep quality. Baseline: painful Target date: 04/07/2022 Goal status: INITIAL     LONG TERM GOALS:  Patient will report at least 75% improvement in overall symptoms and/or function to demonstrate improved functional mobility Baseline: 0% Target date:  05/12/2022 Goal status: INITIAL  2.  Patient will demonstrate at least 15 degrees of left hip IR and ER to improve ROM in hip  Baseline: see above Target date: 05/12/2022 Goal status: INITIAL  3.  Patient will report ability to perform more LE exercises while in aquatics to improve strength and mobility. Baseline: unable Target date: 05/12/2022 Goal status: INITIAL       PLAN: PT FREQUENCY: 1-2x/week for total of 12 visits over 10 week certiication  PT DURATION: 10 weeks  PLANNED INTERVENTIONS: Therapeutic exercises, Therapeutic activity, Neuromuscular re-education, Balance training, Gait training, Patient/Family education, Self Care, Joint mobilization, Joint manipulation, Orthotic/Fit training, DME instructions, Aquatic Therapy, Dry Needling, Electrical stimulation, Spinal manipulation, Spinal mobilization, Cryotherapy, Moist heat, Ionotophoresis 4mg /ml Dexamethasone, and Manual therapy.  PLAN FOR NEXT SESSION: left hip STM, mobilizations, long axis traction, hip IR/ER Ext/flexion, isometrics and strengthening   1:19 PM, 03/03/22 05/03/22, DPT Physical Therapy with [facility]

## 2022-03-03 NOTE — Progress Notes (Signed)
[facility] - Clinic Note  03/09/2022    CHIEF COMPLAINT Patient presents for Retina Follow Up  HISTORY OF PRESENT ILLNESS: Kaitlyn Parks is a 87 y.o. female who presents to the clinic today for:   HPI     Retina Follow Up   Patient presents with  Wet AMD.  In both eyes.  This started 7 weeks ago.  I, the attending physician,  performed the HPI with the patient and updated documentation appropriately.        Comments   Patient here for 7 weeks retina follow up for exu ARMD OU. Patient states vision is ok. A little cloudy sometimes. Thinks needs new glasses. Sees better with old glasses. On occasion  OD has a sharp pain. Last only a few seconds.       Last edited by Rennis ChrisZamora, Sheniya Garciaperez, MD on 03/09/2022  4:43 PM.    Pt states    Referring physician: No referring provider defined for this encounter.  HISTORICAL INFORMATION:  Selected notes from the MEDICAL RECORD NUMBER Referred by Dr. Swazil[place]Jordan DeMarco for concern of exu ARMD LEE: 11.26.19 (J. 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 (Ophthalmic Drugs)   Current Outpatient Medications (Other)  Medication Sig   amLODipine (NORVASC) 2.5 MG tablet Take 1 tablet (2.5 mg total) by mouth daily.   aspirin EC 81 MG tablet Take 81 mg by mouth daily.   Biotin w/ Vitamins C & E (HAIR/SKIN/NAILS PO) Take 1 tablet by mouth daily.   Cranberry 400 MG CAPS Take 400 mg by mouth daily.   hydrALAZINE (APRESOLINE) 10 MG tablet TAKE (1) TABLET TWICE A DAY. MAY TAKE EXTRA DOSE FOR SBP >160 UP TO A TOTAL OF 4 TIMES A DAY. (Patient taking differently: TAKE (1) TABLET TWICE A DAY. MAY TAKE EXTRA DOSE FOR SBP >160 UP TO A TOTAL OF 4  TIMES A DAY. As Needed)   isosorbide mononitrate (IMDUR) 60 MG 24 hr tablet TAKE 1 & 1/2 TABLETS A DAY.   Multiple Vitamins-Calcium (ONE-A-DAY WOMENS PO) Take 1 tablet by mouth daily.    nitrofurantoin, macrocrystal-monohydrate, (MACROBID) 100 MG capsule Take by mouth. Takes as Needed   nitroGLYCERIN (NITROSTAT) 0.4 MG SL tablet DISSOLVE 1 TABLET UNDER TONGUE IF NEEDED FOR CHEST PAIN. MAY REPEAT IN 5 MINUTES FOR 3 DOSES.   PRESCRIPTION MEDICATION Avastin eye injections given by Dr Vanessa BarbaraZamora due to macular degeneration   rosuvastatin (CRESTOR) 10 MG tablet TABLET ONE-HALF TABLET BY MOUTH DAILY   SYNTHROID 50 MCG tablet TAKE 1 TABLET EACH DAY.   famotidine (PEPCID) 20 MG tablet Take 1 tablet (20 mg total) by mouth daily.   No current facility-administered medications for this visit. (Other)   Facility-Administered Medications Ordered in Other Visits (Other)  Medication Route   technetium tetrofosmin (TC-MYOVIEW) injection 32.9 millicurie Intravenous   REVIEW OF SYSTEMS: ROS   Positive for: Gastrointestinal, Genitourinary, Musculoskeletal, Cardiovascular, Eyes Negative for: Constitutional, Neurological, Skin, HENT, Endocrine, Respiratory, Psychiatric, Allergic/Imm, Heme/Lymph Last edited by Laddie Aquaslarke, Rebecca S, COA on 03/09/2022  1:35 PM.     ALLERGIES Allergies  Allergen Reactions   Acyclovir And Related Other (See Comments)    unknown   Pravachol [Pravastatin Sodium] Other (See Comments)    cystitis  Sulfa Antibiotics     nausea   Zocor [Simvastatin] Other (See Comments)    cystitis   PAST MEDICAL HISTORY Past Medical History:  Diagnosis Date   Arthritis    DDD, scoliosis, sees Dr. Lovell Sheehan for this, uses norco very rarely for pain   Bradycardia 11/11/2017   CAD (coronary artery disease)    LAD stenting of a 90% lesion 2012   Cystocele    Educated about COVID-19 virus infection 12/06/2019   Elevated cholesterol    GERD (gastroesophageal reflux disease)    dx in work up 2016 for  atypical CP at OSH   pt. denies   Heart murmur    Hypertensive retinopathy    OU   Hypothyroidism    Interstitial cystitis    sees Dr. [name]   Macular degeneration    OU   NSTEMI (non-ST elevated myocardial infarction) [facility])    Osteoporosis    Rectocele    S/P hip replacement, right 06/12/2017   Scoliosis    Thyroid disease    Hypothyroid   Urinary incontinence    USI   Uterine prolapse    Past Surgical History:  Procedure Laterality Date   BLADDER SUSPENSION  2011   CATARACT EXTRACTION Bilateral 2015   Dr. Elmer Picker   CORONARY ANGIOPLASTY WITH STENT PLACEMENT  04/21/2010   LAD 80%, RCA 30%, nl EF, s/p DES LAD   EYE SURGERY     LEFT HEART CATH AND CORONARY ANGIOGRAPHY N/A 06/14/2017   Procedure: LEFT HEART CATH AND CORONARY ANGIOGRAPHY;  Surgeon: Runell Gess, MD;  Location: [facility];  Service: Cardiovascular;  Laterality: N/A;   OOPHORECTOMY  2011   BSO   TOTAL HIP ARTHROPLASTY Right 06/10/2017   Procedure: RIGHT TOTAL HIP ARTHROPLASTY ANTERIOR APPROACH;  Surgeon: Samson Frederic, MD;  Location: [facility];  Service: Orthopedics;  Laterality: Right;  Needs RNFA   VAGINAL HYSTERECTOMY  2011   LAVH BSO; benign   FAMILY HISTORY Family History  Problem Relation Age of Onset   Hypertension Mother    Heart disease Mother    Heart disease Father    Heart disease Sister    Diabetes Sister    Uterine cancer Sister        mets to lungs   Lung cancer Sister    Scoliosis Sister    Heart disease Brother    Colon cancer Paternal Aunt 51   Breast cancer Paternal Aunt        Age 66's   Breast cancer Paternal Aunt    Leukemia Paternal Aunt    Lung cancer Paternal Grandfather        smoker   Arthritis Daughter    Breast cancer Cousin        Maternal 1st cousins-Age 6's   Esophageal cancer Neg Hx    Pancreatic cancer Neg Hx    Stomach cancer Neg Hx    SOCIAL HISTORY Social History   Tobacco Use   Smoking status: Never   Smokeless tobacco: Never  Vaping Use    Vaping Use: Never used  Substance Use Topics   Alcohol use: No    Comment: Rare   Drug use: No       OPHTHALMIC EXAM: Base Eye Exam     Visual Acuity (Snellen - Linear)       Right Left   Dist [place] 20/25 -2 20/60 +2   Dist ph [place]  20/40         Tonometry (Tonopen, 1:32  PM)       Right Left   Pressure 10 12         Pupils       Dark Light Shape React APD   Right 3 2 Round Brisk None   Left 3 2 Round Brisk None         Visual Fields (Counting fingers)       Left Right    Full Full         Extraocular Movement       Right Left    Full, Ortho Full, Ortho         Neuro/Psych     Oriented x3: Yes   Mood/Affect: Normal         Dilation     Both eyes: 1.0% Mydriacyl, 2.5% Phenylephrine @ 1:32 PM           Slit Lamp and Fundus Exam     Slit Lamp Exam       Right Left   Lids/Lashes mild Telangiectasia, marginal lesion nasal UL Telangiectasia, Meibomian gland dysfunction, lower lid edema   Conjunctiva/Sclera White and quiet White and quiet, inferior conj chalasis   Cornea Arcus, 2+ Punctate epithelial erosions, tear film debris 1-2+ inferior punctate epithelial erosions, Arcus, tear film debris   Anterior Chamber Deep and clear Deep and clear   Iris Round and dilated Round and well dilated   Lens PC IOL in good position, trace Posterior capsular opacification (linear, extending just to visual axis) PC IOL in excellent position   Anterior Vitreous Vitreous syneresis, Posterior vitreous detachment Vitreous syneresis, Posterior vitreous detachment, vitreous condensations         Fundus Exam       Right Left   Disc Pink and Sharp, Compact, focal PPP Pink and Sharp, mild temporal PPA/PPP   C/D Ratio 0.2 0.3   Macula Blunted foveal reflex, Drusen, RPE mottling and clumping, early Atrophy, +PEDs -- stably improved, stable improvement in central cyst / IRF, No heme Blunted foveal reflex, +drusen, pigment clumping, RPE mottling, clumping and  atrophy, no heme, central PED / CNV with overlying cystic changes -- stably improved, +GA   Vessels attenuated, Tortuous attenuated, Tortuous   Periphery Attached, scattered reticular degeneration   Attached, scattered reticular degeneration           IMAGING AND PROCEDURES  Imaging and Procedures for @TODAY @  OCT, Retina - OU - Both Eyes       Right Eye Quality was good. Central Foveal Thickness: 237. Progression has been stable. Findings include no IRF, no SRF, abnormal foveal contour, retinal drusen , intraretinal hyper-reflective material, pigment epithelial detachment, outer retinal atrophy (Stable improvement in IRF/cystic changes, patchy ORA / GA).   Left Eye Quality was good. Central Foveal Thickness: 245. Progression has been stable. Findings include normal foveal contour, no IRF, no SRF, retinal drusen , intraretinal hyper-reflective material, pigment epithelial detachment, outer retinal atrophy (stable improvement in IRF/cystic changes ).   Notes *Images captured and stored on drive  Diagnosis / Impression:  OD: exudative ARMD -- Stable improvement in IRF/cystic changes, patchy ORA / GA OS: exu-ARMD -- stable improvement in IRF/cystic changes   Clinical management:  See below  Abbreviations: NFP - Normal foveal profile. CME - cystoid macular edema. PED - pigment epithelial detachment. IRF - intraretinal fluid. SRF - subretinal fluid. EZ - ellipsoid zone. ERM - epiretinal membrane. ORA - outer retinal atrophy. ORT - outer retinal tubulation. SRHM - subretinal hyper-reflective material  Intravitreal Injection, Pharmacologic Agent - OS - Left Eye       Time Out 03/09/2022. 2:32 PM. Confirmed correct patient, procedure, site, and patient consented.   Anesthesia Topical anesthesia was used. Anesthetic medications included Lidocaine 2%, Proparacaine 0.5%.   Procedure Preparation included 5% betadine to ocular surface, eyelid speculum. A supplied needle was used.    Injection: 1.25 mg Bevacizumab 1.25mg /0.51ml   Route: Intravitreal, Site: Left Eye   NDC: 76195-093-26, Lot: [id number] , Expiration date: 05/05/2022   Post-op Post injection exam found visual acuity of at least counting fingers. The patient tolerated the procedure well. There were no complications. The patient received written and verbal post procedure care education. Post injection medications were not given.            ASSESSMENT/PLAN:    ICD-10-CM   1. Exudative age-related macular degeneration of left eye with active choroidal neovascularization (HCC)  H35.3221 OCT, Retina - OU - Both Eyes    Intravitreal Injection, Pharmacologic Agent - OS - Left Eye    Bevacizumab (AVASTIN) SOLN 1.25 mg    2. Intermediate stage nonexudative age-related macular degeneration of right eye  H35.3112     3. Essential hypertension  I10     4. Hypertensive retinopathy of both eyes  H35.033     5. Pseudophakia of both eyes  Z96.1     6. Dry eyes  H04.123       1. Exudative age-related macular degeneration, left eye   - OCT 4.30.2020 showed interval conversion of OS from nonexudative ARMD to exu-ARMD with large dome-shaped PED with overlying IRF/SRF  - FA 05.28.20 -- no active CNV OS, just staining  - s/p IVA OS #1 (04.30.20), #2 (05.28.20), #3 (06.26.20), #4 (08.03.20), #5 (09.11.20), #6 (10.19.20), #7 (11.23.20),  - reactivation of CNV noted on 05.05.22 -- s/p IVA OS  #8 (05.05.22), #9 (06.06.22), #10 (07.19.22), #11 (09.13.22), #12 (10.25.22), #13 (12.13.22), #14 (01.30.23), #15 (3.20.23), #16 (05.08.23), #17 (06.19.23)  - **Interval increase in IRF overlying PED at 8 wks on 09.13.22 visit**  - BCVA stable at 20/40   - OCT shows OS: stable improvement in IRF/cystic changes at 7 wks  - recommend IVA OS #18 today, 08.07.23 with ext f/u to 8 weeks   - pt wishes to proceed with injection  - RBA of procedure discussed, questions answered  - IVA informed consent obtained and re-signed,  05.08.23 (OS)  - see procedure note  - benefits investigation for Eylea initiated 4.30.2020 -- approved for 2022  - f/u in 8 wks -- DFE/OCT, possible injection -- tx and ext as able  2. Age related macular degeneration, exudative, OD  - interval development of IRF first noted on 09.11.20 -- conversion from nonexudative to exudative ARMD  - pt initially presented on 11.27.19 due to alert from [facility] home monitoring system for OD  - [facility] prescribed by Dr. 11.29.19  - FA (5.28.2020) showed staining / window defect corresponding temporal RPE changes OU -- no active CNV OU  - S/P IVA OD #1 (09.11.20), #2 (10.19.20), #3 (11.23.20), #4 (01.07.21), #5 (2.19.21), #6 (04.02.21), #7 (05.28.21), #8 (07.23.21), #9 (09.24.21), #10 (11.29.21), #11 (02.11.22), #12 (05.05.22), #13 (7.19.22), #14 (09.13.22)  - benefits investigation for Eylea initiated 4.30.2020 -- approved for 2022  - OCT shows OD: Stable improvement in IRF/cystic changes  - BCVA stable at 20/25  - will hold IVA OD again today -- will treat prn  - f/u 8 weeks DFE, OCT  3,4. Hypertensive retinopathy OU  -  discussed importance of tight BP control  - monitor  5. Pseudophakia OU  - s/p CE/IOL OU  - beautiful surgeries, doing well  - monitor  6. Dry eyes OU  - recommend artificial tears and lubricating ointment as needed   Ophthalmic Meds Ordered this visit:  Meds ordered this encounter  Medications   Bevacizumab (AVASTIN) SOLN 1.25 mg     Return in about 8 weeks (around 05/04/2022) for f/u exu ARMD OS, DFE, OCT.  There are no Patient Instructions on file for this visit.  This document serves as a record of services personally performed by Karie Chimera, MD, PhD. It was created on their behalf by Glee Arvin. Manson Passey, OA an ophthalmic technician. The creation of this record is the provider's dictation and/or activities during the visit.    Electronically signed by: Glee Arvin. Manson Passey, [place] 08.01.2023 4:48 PM  Karie Chimera, M.D.,  Ph.D. Diseases & Surgery of the Retina and Vitreous [facility]  I have reviewed the above documentation for accuracy and completeness, and I agree with the above. Karie Chimera, M.D., Ph.D. 03/09/22 4:48 PM  Abbreviations: M myopia (nearsighted); A astigmatism; H hyperopia (farsighted); P presbyopia; Mrx spectacle prescription;  CTL contact lenses; OD right eye; OS left eye; OU both eyes  XT exotropia; ET esotropia; PEK punctate epithelial keratitis; PEE punctate epithelial erosions; DES dry eye syndrome; MGD meibomian gland dysfunction; ATs artificial tears; PFAT's preservative free artificial tears; NSC nuclear sclerotic cataract; PSC posterior subcapsular cataract; ERM epi-retinal membrane; PVD posterior vitreous detachment; RD retinal detachment; DM diabetes mellitus; DR diabetic retinopathy; NPDR non-proliferative diabetic retinopathy; PDR proliferative diabetic retinopathy; CSME clinically significant macular edema; DME diabetic macular edema; dbh dot blot hemorrhages; CWS cotton wool spot; POAG primary open angle glaucoma; C/D cup-to-disc ratio; HVF humphrey visual field; GVF goldmann visual field; OCT optical coherence tomography; IOP intraocular pressure; BRVO Branch retinal vein occlusion; CRVO central retinal vein occlusion; CRAO central retinal artery occlusion; BRAO branch retinal artery occlusion; RT retinal tear; SB scleral buckle; PPV pars plana vitrectomy; VH Vitreous hemorrhage; PRP panretinal laser photocoagulation; IVK intravitreal kenalog; VMT vitreomacular traction; MH Macular hole;  NVD neovascularization of the disc; NVE neovascularization elsewhere; AREDS age related eye disease study; ARMD age related macular degeneration; POAG primary open angle glaucoma; EBMD epithelial/anterior basement membrane dystrophy; ACIOL anterior chamber intraocular lens; IOL intraocular lens; PCIOL posterior chamber intraocular lens; Phaco/IOL phacoemulsification with intraocular  lens placement; PRK photorefractive keratectomy; LASIK laser assisted in situ keratomileusis; HTN hypertension; DM diabetes mellitus; COPD chronic obstructive pulmonary disease

## 2022-03-09 ENCOUNTER — Ambulatory Visit (INDEPENDENT_AMBULATORY_CARE_PROVIDER_SITE_OTHER): Payer: Medicare Other | Admitting: Ophthalmology

## 2022-03-09 ENCOUNTER — Encounter (INDEPENDENT_AMBULATORY_CARE_PROVIDER_SITE_OTHER): Payer: Self-pay | Admitting: Ophthalmology

## 2022-03-09 DIAGNOSIS — H35033 Hypertensive retinopathy, bilateral: Secondary | ICD-10-CM

## 2022-03-09 DIAGNOSIS — Z961 Presence of intraocular lens: Secondary | ICD-10-CM

## 2022-03-09 DIAGNOSIS — I10 Essential (primary) hypertension: Secondary | ICD-10-CM

## 2022-03-09 DIAGNOSIS — H04123 Dry eye syndrome of bilateral lacrimal glands: Secondary | ICD-10-CM

## 2022-03-09 DIAGNOSIS — H353221 Exudative age-related macular degeneration, left eye, with active choroidal neovascularization: Secondary | ICD-10-CM

## 2022-03-09 DIAGNOSIS — H353112 Nonexudative age-related macular degeneration, right eye, intermediate dry stage: Secondary | ICD-10-CM

## 2022-03-09 MED ORDER — BEVACIZUMAB CHEMO INJECTION 1.25MG/0.05ML SYRINGE FOR KALEIDOSCOPE
1.2500 mg | INTRAVITREAL | Status: AC | PRN
  Administered 2022-03-09: 1.25 mg via INTRAVITREAL

## 2022-03-09 NOTE — Therapy (Unsigned)
OUTPATIENT PHYSICAL THERAPY TREATMENT NOTE   Patient Name: Kaitlyn Parks MRN: [id number] DOB:22-Feb-1935, 87 y.o., female Today's Date: 03/10/2022   END OF SESSION:   PT End of Session - 03/10/22 1431     Visit Number 2    Number of Visits 12    Date for PT Re-Evaluation 05/12/22    Authorization Type [employer]    Progress Note Due on Visit 10    PT Start Time 1431    PT Stop Time 1510    PT Time Calculation (min) 39 min             Past Medical History:  Diagnosis Date   Arthritis    DDD, scoliosis, sees Dr. Lovell Sheehan for this, uses norco very rarely for pain   Bradycardia 11/11/2017   CAD (coronary artery disease)    LAD stenting of a 90% lesion 2012   Cystocele    Educated about COVID-19 virus infection 12/06/2019   Elevated cholesterol    GERD (gastroesophageal reflux disease)    dx in work up 2016 for atypical CP at [facility]   pt. denies   Heart murmur    Hypertensive retinopathy    OU   Hypothyroidism    Interstitial cystitis    sees Dr. [name]   Macular degeneration    OU   NSTEMI (non-ST elevated myocardial infarction) [facility])    Osteoporosis    Rectocele    S/P hip replacement, right 06/12/2017   Scoliosis    Thyroid disease    Hypothyroid   Urinary incontinence    USI   Uterine prolapse    Past Surgical History:  Procedure Laterality Date   BLADDER SUSPENSION  2011   CATARACT EXTRACTION Bilateral 2015   Dr. Elmer Picker   CORONARY ANGIOPLASTY WITH STENT PLACEMENT  04/21/2010   LAD 80%, RCA 30%, nl EF, s/p DES LAD   EYE SURGERY     LEFT HEART CATH AND CORONARY ANGIOGRAPHY N/A 06/14/2017   Procedure: LEFT HEART CATH AND CORONARY ANGIOGRAPHY;  Surgeon: Runell Gess, MD;  Location: [facility];  Service: Cardiovascular;  Laterality: N/A;   OOPHORECTOMY  2011   BSO   TOTAL HIP ARTHROPLASTY Right 06/10/2017   Procedure: RIGHT TOTAL HIP ARTHROPLASTY ANTERIOR APPROACH;  Surgeon: Samson Frederic, MD;  Location: [facility];  Service: Orthopedics;  Laterality: Right;   Needs RNFA   VAGINAL HYSTERECTOMY  2011   LAVH BSO; benign   Patient Active Problem List   Diagnosis Date Noted   Aortic atherosclerosis (HCC) 12/07/2019   Macular degeneration 01/11/2019   Coronary artery disease involving native coronary artery of native heart without angina pectoris 10/29/2018   Presence of intraocular lens 09/23/2018   Bradycardia 11/11/2017   Dyslipidemia 11/11/2017   Pain in joint of right hip 08/04/2017   CAD (coronary artery disease) 06/12/2017   GERD (gastroesophageal reflux disease) 06/12/2017   S/P hip replacement, right 06/12/2017   Osteoarthritis of right hip 06/10/2017   CAD S/P percutaneous coronary angioplasty 02/11/2015   Renal insufficiency 02/11/2015   Prolonged Q-T interval on ECG 04/18/2010   Hypertension 08/11/2007   Hypothyroidism (acquired) 06/08/2007   MITRAL VALVE PROLAPSE 06/08/2007   PCP: Theodis Shove, MD   REFERRING PROVIDER: Renaldo Fiddler   REFERRING DIAG: Sacroiliitis M46.1   Rationale for Evaluation and Treatment Rehabilitation   THERAPY DIAG:  Sacroiliac joint dysfunction   Muscle weakness (generalized)   Chronic pain of left knee   ONSET DATE: 4 years  SUBJECTIVE:                                                                                                                                                                                            SUBJECTIVE STATEMENT:  03/10/2022 States that  it has been good and she has been doing her exercises. States that she did have some pain on Saturday and she thinks that its from her working in the garden with the hose.  Eval: Patient complains of chronic left SIJ pain that has been managed with quarterly injections. Reports she also has left knee pain that wakes her up at night and this is her biggest complaint. States that she also uses heat and ice and this helps. She is active in water classes Tues/Thurs in the AM and would liek to go back to yoga.   PERTINENT  HISTORY:  Scoliosis, R THA 5 years ago, history of cardiac issues   PAIN:  Are you having pain? Yes: NPRS scale: 3/10 Pain location: left knee pain Pain description: throbbing Aggravating factors: aggressive aquatic routine, long distance walking, grocery store Relieving factors: ice, rest     PRECAUTIONS: None   WEIGHT BEARING RESTRICTIONS No   FALLS:  Has patient fallen in last 6 months? No   LIVING ENVIRONMENT: Lives with: lives alone Lives in: House/apartment     OCCUPATION: not working, likes to carden in her potted plants    PLOF: Independent   PATIENT GOALS to not have left knee pain. To go back to yoga (stopped 7 years ago)     OBJECTIVE:    DIAGNOSTIC FINDINGS:  None recent at [facility]     SCREENING FOR RED FLAGS: Bowel or bladder incontinence: No Spinal tumors: No Cauda equina syndrome: No Compression fracture: No Abdominal aneurysm: No   COGNITION:           Overall cognitive status: Within functional limits for tasks assessed                              MUSCLE LENGTH: Hamstrings: Right 30 deg; Left 30 deg     POSTURE: S curve in spine, right hip higher than left, forward head.    PALPATION: Tenderness to palpation along glute meds and lumbar paraspinals              LE Measurements       Lower Extremity Right 03/03/2022 Left 03/03/2022    A/PROM MMT A/PROM MMT  Hip Flexion 105 4 90 4-  Hip Extension          Hip  Abduction          Hip Adduction          Hip Internal rotation 45   5*    Hip External rotation 40   10    Knee Flexion 120 4 120* 4-  Knee Extension 0 4 8 lacking extension 4-  Ankle Dorsiflexion          Ankle Plantarflexion          Ankle Inversion          Ankle Eversion           (Blank rows = not tested)            * pain     LUMBAR SPECIAL TESTS:  Positive ely's test on left            TODAY'S TREATMENT  03/10/2022 Therapeutic Exercise:    Aerobic: Supine: hip IR 2 minutes B, hip ER bent knee fall out 2  minutes B Prone:    Seated:pelvic motions - tactile cues - 8 minutes - - isolated lateral and forward and backwards     Standing: hip flexor stretch - difficult to do - stopped, pelvic tilts - difficult to do Neuromuscular Re-education: Manual Therapy: long axis traction left leg - tolerated well, STM to left glutes and hip muscles - gentle Therapeutic Activity: Self Care: Trigger Point Dry Needling:  Modalities:      PATIENT EDUCATION:  Education details: on HEP Person educated: Patient Education method: Explanation, Facilities manager, and Handouts Education comprehension: verbalized understanding   HOME EXERCISE PROGRAM: [id number]   ASSESSMENT:   CLINICAL IMPRESSION: 03/10/2022 Tolerated session moderately well. Gentle STM and traction which felt good but patient with reports that she feels it all later. Discussed use of ice if sore and to continue to perform gentle ROM. Patient with combined movement patterns with difficulty isolating one motion at a time. Improved pelvic tilts in seated with core engaged after practice. Will continue with current POC as tolerated.   Eval: Patient complains of left SIJ and left knee pain that started years ago. SIJ pain is managed by injections and this will also help with the knee pain. Patient limited in left hip ROM in all directions with pain in knee presenting with left hip IR. Educated patient on findings and POC moving forward. Patient would greatly benefit from skilled PT to improve overall function and QOL.      OBJECTIVE IMPAIRMENTS Abnormal gait, decreased activity tolerance, decreased balance, decreased mobility, difficulty walking, decreased ROM, decreased strength, postural dysfunction, and pain.    ACTIVITY LIMITATIONS sleeping and locomotion level   PARTICIPATION LIMITATIONS: shopping, community activity, and yard work   PERSONAL FACTORS Age and 1-2 comorbidities: chronic left SIJ pain, R THA   are also affecting patient's functional  outcome.    REHAB POTENTIAL: Good   CLINICAL DECISION MAKING: Stable/uncomplicated   EVALUATION COMPLEXITY: Low   GOALS: Goals reviewed with patient?  yes   SHORT TERM GOALS:   Patient will be independent in self management strategies to improve quality of life and functional outcomes. Baseline: new program Target date: 04/07/2022 Goal status: INITIAL   2.  Patient will report at least 50% improvement in overall symptoms and/or function to demonstrate improved functional mobility Baseline: 0% Target date: 04/07/2022 Goal status: INITIAL   3.  Patient will be able to report sleeping through the night without waking up secondary to pain in her left knee to improve sleep quality. Baseline: painful Target  date: 04/07/2022 Goal status: INITIAL         LONG TERM GOALS:   Patient will report at least 75% improvement in overall symptoms and/or function to demonstrate improved functional mobility Baseline: 0% Target date: 05/12/2022 Goal status: INITIAL   2.  Patient will demonstrate at least 15 degrees of left hip IR and ER to improve ROM in hip  Baseline: see above Target date: 05/12/2022 Goal status: INITIAL   3.  Patient will report ability to perform more LE exercises while in aquatics to improve strength and mobility. Baseline: unable Target date: 05/12/2022 Goal status: INITIAL             PLAN: PT FREQUENCY: 1-2x/week for total of 12 visits over 10 week certiication   PT DURATION: 10 weeks   PLANNED INTERVENTIONS: Therapeutic exercises, Therapeutic activity, Neuromuscular re-education, Balance training, Gait training, Patient/Family education, Self Care, Joint mobilization, Joint manipulation, Orthotic/Fit training, DME instructions, Aquatic Therapy, Dry Needling, Electrical stimulation, Spinal manipulation, Spinal mobilization, Cryotherapy, Moist heat, Ionotophoresis 4mg /ml Dexamethasone, and Manual therapy.   PLAN FOR NEXT SESSION: left hip STM, mobilizations,  long axis traction, hip IR/ER Ext/flexion, isometrics and strengthening    3:16 PM, 03/10/22 05/10/22, DPT Physical Therapy with [facility]

## 2022-03-10 ENCOUNTER — Ambulatory Visit: Payer: Medicare Other | Admitting: Physical Therapy

## 2022-03-10 ENCOUNTER — Encounter: Payer: Self-pay | Admitting: Physical Therapy

## 2022-03-10 DIAGNOSIS — M6281 Muscle weakness (generalized): Secondary | ICD-10-CM

## 2022-03-10 DIAGNOSIS — M25562 Pain in left knee: Secondary | ICD-10-CM | POA: Diagnosis not present

## 2022-03-10 DIAGNOSIS — M533 Sacrococcygeal disorders, not elsewhere classified: Secondary | ICD-10-CM | POA: Diagnosis not present

## 2022-03-10 DIAGNOSIS — G8929 Other chronic pain: Secondary | ICD-10-CM

## 2022-03-11 ENCOUNTER — Ambulatory Visit: Payer: Medicare Other | Admitting: Physical Therapy

## 2022-03-11 ENCOUNTER — Encounter: Payer: Self-pay | Admitting: Physical Therapy

## 2022-03-11 DIAGNOSIS — G8929 Other chronic pain: Secondary | ICD-10-CM | POA: Diagnosis not present

## 2022-03-11 DIAGNOSIS — M25562 Pain in left knee: Secondary | ICD-10-CM | POA: Diagnosis not present

## 2022-03-11 DIAGNOSIS — M533 Sacrococcygeal disorders, not elsewhere classified: Secondary | ICD-10-CM | POA: Diagnosis not present

## 2022-03-11 DIAGNOSIS — M6281 Muscle weakness (generalized): Secondary | ICD-10-CM

## 2022-03-11 NOTE — Therapy (Signed)
OUTPATIENT PHYSICAL THERAPY TREATMENT NOTE   Patient Name: Kaitlyn Parks MRN: [id number] DOB:1935-03-01, 87 y.o., female Today's Date: 03/11/2022   END OF SESSION:   PT End of Session - 03/11/22 1346     Visit Number 3    Number of Visits 12    Date for PT Re-Evaluation 05/12/22    Authorization Type [employer]    Progress Note Due on Visit 10    PT Start Time 1347    PT Stop Time 1425    PT Time Calculation (min) 38 min             Past Medical History:  Diagnosis Date   Arthritis    DDD, scoliosis, sees Dr. Lovell Sheehan for this, uses norco very rarely for pain   Bradycardia 11/11/2017   CAD (coronary artery disease)    LAD stenting of a 90% lesion 2012   Cystocele    Educated about COVID-19 virus infection 12/06/2019   Elevated cholesterol    GERD (gastroesophageal reflux disease)    dx in work up 2016 for atypical CP at OSH   pt. denies   Heart murmur    Hypertensive retinopathy    OU   Hypothyroidism    Interstitial cystitis    sees Dr. [name]   Macular degeneration    OU   NSTEMI (non-ST elevated myocardial infarction) [facility])    Osteoporosis    Rectocele    S/P hip replacement, right 06/12/2017   Scoliosis    Thyroid disease    Hypothyroid   Urinary incontinence    USI   Uterine prolapse    Past Surgical History:  Procedure Laterality Date   BLADDER SUSPENSION  2011   CATARACT EXTRACTION Bilateral 2015   Dr. Elmer Picker   CORONARY ANGIOPLASTY WITH STENT PLACEMENT  04/21/2010   LAD 80%, RCA 30%, nl EF, s/p DES LAD   EYE SURGERY     LEFT HEART CATH AND CORONARY ANGIOGRAPHY N/A 06/14/2017   Procedure: LEFT HEART CATH AND CORONARY ANGIOGRAPHY;  Surgeon: Runell Gess, MD;  Location: [facility];  Service: Cardiovascular;  Laterality: N/A;   OOPHORECTOMY  2011   BSO   TOTAL HIP ARTHROPLASTY Right 06/10/2017   Procedure: RIGHT TOTAL HIP ARTHROPLASTY ANTERIOR APPROACH;  Surgeon: Samson Frederic, MD;  Location: [facility];  Service: Orthopedics;  Laterality: Right;   Needs RNFA   VAGINAL HYSTERECTOMY  2011   LAVH BSO; benign   Patient Active Problem List   Diagnosis Date Noted   Aortic atherosclerosis (HCC) 12/07/2019   Macular degeneration 01/11/2019   Coronary artery disease involving native coronary artery of native heart without angina pectoris 10/29/2018   Presence of intraocular lens 09/23/2018   Bradycardia 11/11/2017   Dyslipidemia 11/11/2017   Pain in joint of right hip 08/04/2017   CAD (coronary artery disease) 06/12/2017   GERD (gastroesophageal reflux disease) 06/12/2017   S/P hip replacement, right 06/12/2017   Osteoarthritis of right hip 06/10/2017   CAD S/P percutaneous coronary angioplasty 02/11/2015   Renal insufficiency 02/11/2015   Prolonged Q-T interval on ECG 04/18/2010   Hypertension 08/11/2007   Hypothyroidism (acquired) 06/08/2007   MITRAL VALVE PROLAPSE 06/08/2007   PCP: Theodis Shove, MD   REFERRING PROVIDER: Renaldo Fiddler   REFERRING DIAG: Sacroiliitis M46.1   Rationale for Evaluation and Treatment Rehabilitation   THERAPY DIAG:  Sacroiliac joint dysfunction   Muscle weakness (generalized)   Chronic pain of left knee   ONSET DATE: 4 years  SUBJECTIVE:                                                                                                                                                                                            SUBJECTIVE STATEMENT:  03/11/2022 States that she is a little sore today and is having a little bit of knee pain. States she has been doing her exercises States she brought her imaging report.  Eval: Patient complains of chronic left SIJ pain that has been managed with quarterly injections. Reports she also has left knee pain that wakes her up at night and this is her biggest complaint. States that she also uses heat and ice and this helps. She is active in water classes Tues/Thurs in the AM and would liek to go back to yoga.   PERTINENT HISTORY:  Scoliosis, R THA 5  years ago, history of cardiac issues   PAIN:  Are you having pain? Yes: NPRS scale: 2/10 Pain location: left knee pain Pain description: throbbing Aggravating factors: aggressive aquatic routine, long distance walking, grocery store Relieving factors: ice, rest     PRECAUTIONS: None   WEIGHT BEARING RESTRICTIONS No   FALLS:  Has patient fallen in last 6 months? No   LIVING ENVIRONMENT: Lives with: lives alone Lives in: House/apartment     OCCUPATION: not working, likes to carden in her potted plants    PLOF: Independent   PATIENT GOALS to not have left knee pain. To go back to yoga (stopped 7 years ago)     OBJECTIVE:    DIAGNOSTIC FINDINGS:  None recent at [facility]     SCREENING FOR RED FLAGS: Bowel or bladder incontinence: No Spinal tumors: No Cauda equina syndrome: No Compression fracture: No Abdominal aneurysm: No   COGNITION:           Overall cognitive status: Within functional limits for tasks assessed                              MUSCLE LENGTH: Hamstrings: Right 30 deg; Left 30 deg     POSTURE: S curve in spine, right hip higher than left, forward head.    PALPATION: Tenderness to palpation along glute meds and lumbar paraspinals              LE Measurements       Lower Extremity Right 03/03/2022 Left 03/03/2022    A/PROM MMT A/PROM MMT  Hip Flexion 105 4 90 4-  Hip Extension          Hip Abduction  Hip Adduction          Hip Internal rotation 45   5*    Hip External rotation 40   10    Knee Flexion 120 4 120* 4-  Knee Extension 0 4 8 lacking extension 4-  Ankle Dorsiflexion          Ankle Plantarflexion          Ankle Inversion          Ankle Eversion           (Blank rows = not tested)            * pain     LUMBAR SPECIAL TESTS:  Positive ely's test on left            TODAY'S TREATMENT  03/11/2022 Therapeutic Exercise:    Aerobic: Supine: 90/90 position - on ball 2 minutes B, hamstring iso 2 minutes 5" holds, SAQS on  ball 2x15 5" holds. DKC 3 minutes, hip IR 2 minutes B, hip ER bent knee fall out 2 minutes B Prone:    Manual Therapy: tibial bouncing L and R 10 minutes total - pain reduced Neuro: long exhale breathing - tactile and verbal cues - 10 minutes total  Self Care: Trigger Point Dry Needling:  Modalities:      PATIENT EDUCATION:  Education details: on HEP Person educated: Patient Education method: Explanation, Facilities manager, and Handouts Education comprehension: verbalized understanding   HOME EXERCISE PROGRAM: [id number]   ASSESSMENT:   CLINICAL IMPRESSION: 03/11/2022 Session focused on additional of ball exercises. This was tolerated moderately well but frequent cues for form and muscle relaxation. Added breathing exercises for core engagement but this was difficult for patient at first as she tends to hold her breath. Fatigue in legs and core noted end of session. Tibial bouncing on ball abolished left knee symptoms. Will continue with current POC as tolerated.   Eval: Patient complains of left SIJ and left knee pain that started years ago. SIJ pain is managed by injections and this will also help with the knee pain. Patient limited in left hip ROM in all directions with pain in knee presenting with left hip IR. Educated patient on findings and POC moving forward. Patient would greatly benefit from skilled PT to improve overall function and QOL.      OBJECTIVE IMPAIRMENTS Abnormal gait, decreased activity tolerance, decreased balance, decreased mobility, difficulty walking, decreased ROM, decreased strength, postural dysfunction, and pain.    ACTIVITY LIMITATIONS sleeping and locomotion level   PARTICIPATION LIMITATIONS: shopping, community activity, and yard work   PERSONAL FACTORS Age and 1-2 comorbidities: chronic left SIJ pain, R THA   are also affecting patient's functional outcome.    REHAB POTENTIAL: Good   CLINICAL DECISION MAKING: Stable/uncomplicated   EVALUATION  COMPLEXITY: Low   GOALS: Goals reviewed with patient?  yes   SHORT TERM GOALS:   Patient will be independent in self management strategies to improve quality of life and functional outcomes. Baseline: new program Target date: 04/07/2022 Goal status: INITIAL   2.  Patient will report at least 50% improvement in overall symptoms and/or function to demonstrate improved functional mobility Baseline: 0% Target date: 04/07/2022 Goal status: INITIAL   3.  Patient will be able to report sleeping through the night without waking up secondary to pain in her left knee to improve sleep quality. Baseline: painful Target date: 04/07/2022 Goal status: INITIAL         LONG TERM GOALS:  Patient will report at least 75% improvement in overall symptoms and/or function to demonstrate improved functional mobility Baseline: 0% Target date: 05/12/2022 Goal status: INITIAL   2.  Patient will demonstrate at least 15 degrees of left hip IR and ER to improve ROM in hip  Baseline: see above Target date: 05/12/2022 Goal status: INITIAL   3.  Patient will report ability to perform more LE exercises while in aquatics to improve strength and mobility. Baseline: unable Target date: 05/12/2022 Goal status: INITIAL             PLAN: PT FREQUENCY: 1-2x/week for total of 12 visits over 10 week certiication   PT DURATION: 10 weeks   PLANNED INTERVENTIONS: Therapeutic exercises, Therapeutic activity, Neuromuscular re-education, Balance training, Gait training, Patient/Family education, Self Care, Joint mobilization, Joint manipulation, Orthotic/Fit training, DME instructions, Aquatic Therapy, Dry Needling, Electrical stimulation, Spinal manipulation, Spinal mobilization, Cryotherapy, Moist heat, Ionotophoresis 4mg /ml Dexamethasone, and Manual therapy.   PLAN FOR NEXT SESSION: left hip STM, mobilizations, long axis traction, hip IR/ER Ext/flexion, isometrics and strengthening    2:26 PM, 03/11/22 Jerene Pitch, DPT Physical Therapy with [facility]

## 2022-03-16 ENCOUNTER — Ambulatory Visit: Payer: Medicare Other | Admitting: Physical Therapy

## 2022-03-16 ENCOUNTER — Encounter: Payer: Self-pay | Admitting: Physical Therapy

## 2022-03-16 DIAGNOSIS — M533 Sacrococcygeal disorders, not elsewhere classified: Secondary | ICD-10-CM | POA: Diagnosis not present

## 2022-03-16 DIAGNOSIS — G8929 Other chronic pain: Secondary | ICD-10-CM | POA: Diagnosis not present

## 2022-03-16 DIAGNOSIS — M25562 Pain in left knee: Secondary | ICD-10-CM

## 2022-03-16 DIAGNOSIS — M6281 Muscle weakness (generalized): Secondary | ICD-10-CM

## 2022-03-16 NOTE — Therapy (Signed)
OUTPATIENT PHYSICAL THERAPY TREATMENT NOTE   Patient Name: HEAVYN YEARSLEY MRN: [id number] DOB:October 09, 1934, 87 y.o., female Today's Date: 03/16/2022   END OF SESSION:   PT End of Session - 03/16/22 1344     Visit Number 4    Number of Visits 12    Date for PT Re-Evaluation 05/12/22    Authorization Type [employer]    Progress Note Due on Visit 10    PT Start Time 1355    PT Stop Time 1433    PT Time Calculation (min) 38 min             Past Medical History:  Diagnosis Date   Arthritis    DDD, scoliosis, sees Dr. Lovell Sheehan for this, uses norco very rarely for pain   Bradycardia 11/11/2017   CAD (coronary artery disease)    LAD stenting of a 90% lesion 2012   Cystocele    Educated about COVID-19 virus infection 12/06/2019   Elevated cholesterol    GERD (gastroesophageal reflux disease)    dx in work up 2016 for atypical CP at [facility]   pt. denies   Heart murmur    Hypertensive retinopathy    OU   Hypothyroidism    Interstitial cystitis    sees Dr. [name]   Macular degeneration    OU   NSTEMI (non-ST elevated myocardial infarction) [facility])    Osteoporosis    Rectocele    S/P hip replacement, right 06/12/2017   Scoliosis    Thyroid disease    Hypothyroid   Urinary incontinence    USI   Uterine prolapse    Past Surgical History:  Procedure Laterality Date   BLADDER SUSPENSION  2011   CATARACT EXTRACTION Bilateral 2015   Dr. Elmer Picker   CORONARY ANGIOPLASTY WITH STENT PLACEMENT  04/21/2010   LAD 80%, RCA 30%, nl EF, s/p DES LAD   EYE SURGERY     LEFT HEART CATH AND CORONARY ANGIOGRAPHY N/A 06/14/2017   Procedure: LEFT HEART CATH AND CORONARY ANGIOGRAPHY;  Surgeon: Runell Gess, MD;  Location: [facility];  Service: Cardiovascular;  Laterality: N/A;   OOPHORECTOMY  2011   BSO   TOTAL HIP ARTHROPLASTY Right 06/10/2017   Procedure: RIGHT TOTAL HIP ARTHROPLASTY ANTERIOR APPROACH;  Surgeon: Samson Frederic, MD;  Location: [facility];  Service: Orthopedics;  Laterality:  Right;  Needs RNFA   VAGINAL HYSTERECTOMY  2011   LAVH BSO; benign   Patient Active Problem List   Diagnosis Date Noted   Aortic atherosclerosis (HCC) 12/07/2019   Macular degeneration 01/11/2019   Coronary artery disease involving native coronary artery of native heart without angina pectoris 10/29/2018   Presence of intraocular lens 09/23/2018   Bradycardia 11/11/2017   Dyslipidemia 11/11/2017   Pain in joint of right hip 08/04/2017   CAD (coronary artery disease) 06/12/2017   GERD (gastroesophageal reflux disease) 06/12/2017   S/P hip replacement, right 06/12/2017   Osteoarthritis of right hip 06/10/2017   CAD S/P percutaneous coronary angioplasty 02/11/2015   Renal insufficiency 02/11/2015   Prolonged Q-T interval on ECG 04/18/2010   Hypertension 08/11/2007   Hypothyroidism (acquired) 06/08/2007   MITRAL VALVE PROLAPSE 06/08/2007   PCP: Theodis Shove, MD   REFERRING PROVIDER: Renaldo Fiddler   REFERRING DIAG: Sacroiliitis M46.1   Rationale for Evaluation and Treatment Rehabilitation   THERAPY DIAG:  Sacroiliac joint dysfunction   Muscle weakness (generalized)   Chronic pain of left knee   ONSET DATE: 4 years  SUBJECTIVE:                                                                                                                                                                                            SUBJECTIVE STATEMENT:  03/16/2022 States felt better after last session but did too much on Saturday. States she did her exercises this morning.   Eval: Patient complains of chronic left SIJ pain that has been managed with quarterly injections. Reports she also has left knee pain that wakes her up at night and this is her biggest complaint. States that she also uses heat and ice and this helps. She is active in water classes Tues/Thurs in the AM and would liek to go back to yoga.   PERTINENT HISTORY:  Scoliosis, R THA 5 years ago, history of cardiac issues    PAIN:  Are you having pain? Yes: NPRS scale: 3/10 Pain location: left knee pain Pain description: throbbing Aggravating factors: aggressive aquatic routine, long distance walking, grocery store Relieving factors: ice, rest     PRECAUTIONS: None   WEIGHT BEARING RESTRICTIONS No   FALLS:  Has patient fallen in last 6 months? No   LIVING ENVIRONMENT: Lives with: lives alone Lives in: House/apartment     OCCUPATION: not working, likes to carden in her potted plants    PLOF: Independent   PATIENT GOALS to not have left knee pain. To go back to yoga (stopped 7 years ago)     OBJECTIVE:    DIAGNOSTIC FINDINGS:  None recent at [facility]     SCREENING FOR RED FLAGS: Bowel or bladder incontinence: No Spinal tumors: No Cauda equina syndrome: No Compression fracture: No Abdominal aneurysm: No   COGNITION:           Overall cognitive status: Within functional limits for tasks assessed                              MUSCLE LENGTH: Hamstrings: Right 30 deg; Left 30 deg     POSTURE: S curve in spine, right hip higher than left, forward head.    PALPATION: Tenderness to palpation along glute meds and lumbar paraspinals              LE Measurements       Lower Extremity Right 03/03/2022 Left 03/03/2022    A/PROM MMT A/PROM MMT  Hip Flexion 105 4 90 4-  Hip Extension          Hip Abduction          Hip Adduction  Hip Internal rotation 45   5*    Hip External rotation 40   10    Knee Flexion 120 4 120* 4-  Knee Extension 0 4 8 lacking extension 4-  Ankle Dorsiflexion          Ankle Plantarflexion          Ankle Inversion          Ankle Eversion           (Blank rows = not tested)            * pain     LUMBAR SPECIAL TESTS:  Positive ely's test on left            TODAY'S TREATMENT  03/16/2022 Therapeutic Exercise:    Aerobic: Supine: 90/90 position - on ball 2 minutes B, hamstring iso 2 minutes 5" holds,  Standing: DF at wall  3x10 5" holds  SAQS on  ball 2x15 5" holds. DKC 3 minutes, hip IR 2 minutes B, hip ER bent knee fall out 2 minutes B Prone: Self STM to left knee     Manual Therapy: tibial bouncing L and R 10 minutes total - pain reduced, STM to left medial knee and adductors - tolerated well     Trigger Point Dry Needling:  Modalities:      PATIENT EDUCATION:  Education details: on HEP Person educated: Patient Education method: Explanation, Facilities manager, and Handouts Education comprehension: verbalized understanding   HOME EXERCISE PROGRAM: [id number]   ASSESSMENT:   CLINICAL IMPRESSION: 03/16/2022 Tolerated manual interventions well. Added self mobilizations to HEP as well. Helped inflate ball for patient on this date. Overall reduced symptoms noted end of session. Will continue with current POC as tolerated.   Eval: Patient complains of left SIJ and left knee pain that started years ago. SIJ pain is managed by injections and this will also help with the knee pain. Patient limited in left hip ROM in all directions with pain in knee presenting with left hip IR. Educated patient on findings and POC moving forward. Patient would greatly benefit from skilled PT to improve overall function and QOL.      OBJECTIVE IMPAIRMENTS Abnormal gait, decreased activity tolerance, decreased balance, decreased mobility, difficulty walking, decreased ROM, decreased strength, postural dysfunction, and pain.    ACTIVITY LIMITATIONS sleeping and locomotion level   PARTICIPATION LIMITATIONS: shopping, community activity, and yard work   PERSONAL FACTORS Age and 1-2 comorbidities: chronic left SIJ pain, R THA   are also affecting patient's functional outcome.    REHAB POTENTIAL: Good   CLINICAL DECISION MAKING: Stable/uncomplicated   EVALUATION COMPLEXITY: Low   GOALS: Goals reviewed with patient?  yes   SHORT TERM GOALS:   Patient will be independent in self management strategies to improve quality of life and functional  outcomes. Baseline: new program Target date: 04/07/2022 Goal status: INITIAL   2.  Patient will report at least 50% improvement in overall symptoms and/or function to demonstrate improved functional mobility Baseline: 0% Target date: 04/07/2022 Goal status: INITIAL   3.  Patient will be able to report sleeping through the night without waking up secondary to pain in her left knee to improve sleep quality. Baseline: painful Target date: 04/07/2022 Goal status: INITIAL         LONG TERM GOALS:   Patient will report at least 75% improvement in overall symptoms and/or function to demonstrate improved functional mobility Baseline: 0% Target date: 05/12/2022 Goal status: INITIAL   2.  Patient will demonstrate at least 15 degrees of left hip IR and ER to improve ROM in hip  Baseline: see above Target date: 05/12/2022 Goal status: INITIAL   3.  Patient will report ability to perform more LE exercises while in aquatics to improve strength and mobility. Baseline: unable Target date: 05/12/2022 Goal status: INITIAL             PLAN: PT FREQUENCY: 1-2x/week for total of 12 visits over 10 week certiication   PT DURATION: 10 weeks   PLANNED INTERVENTIONS: Therapeutic exercises, Therapeutic activity, Neuromuscular re-education, Balance training, Gait training, Patient/Family education, Self Care, Joint mobilization, Joint manipulation, Orthotic/Fit training, DME instructions, Aquatic Therapy, Dry Needling, Electrical stimulation, Spinal manipulation, Spinal mobilization, Cryotherapy, Moist heat, Ionotophoresis 4mg /ml Dexamethasone, and Manual therapy.   PLAN FOR NEXT SESSION: left hip STM, mobilizations, long axis traction, hip IR/ER Ext/flexion, isometrics and strengthening    3:27 PM, 03/16/22 03/18/22, DPT Physical Therapy with [facility]

## 2022-03-18 ENCOUNTER — Ambulatory Visit: Payer: Medicare Other | Admitting: Physical Therapy

## 2022-03-18 ENCOUNTER — Encounter: Payer: Self-pay | Admitting: Physical Therapy

## 2022-03-18 DIAGNOSIS — M6281 Muscle weakness (generalized): Secondary | ICD-10-CM | POA: Diagnosis not present

## 2022-03-18 DIAGNOSIS — M25562 Pain in left knee: Secondary | ICD-10-CM | POA: Diagnosis not present

## 2022-03-18 DIAGNOSIS — G8929 Other chronic pain: Secondary | ICD-10-CM | POA: Diagnosis not present

## 2022-03-18 DIAGNOSIS — M533 Sacrococcygeal disorders, not elsewhere classified: Secondary | ICD-10-CM

## 2022-03-18 NOTE — Therapy (Signed)
OUTPATIENT PHYSICAL THERAPY TREATMENT NOTE   Patient Name: Kaitlyn Parks MRN: [id number] DOB:May 24, 1935, 87 y.o., female Today's Date: 03/18/2022   END OF SESSION:   PT End of Session - 03/18/22 1345     Visit Number 5    Number of Visits 12    Date for PT Re-Evaluation 05/12/22    Authorization Type [employer]    Progress Note Due on Visit 10    PT Start Time 1346    PT Stop Time 1424    PT Time Calculation (min) 38 min             Past Medical History:  Diagnosis Date   Arthritis    DDD, scoliosis, sees Dr. Lovell Sheehan for this, uses norco very rarely for pain   Bradycardia 11/11/2017   CAD (coronary artery disease)    LAD stenting of a 90% lesion 2012   Cystocele    Educated about COVID-19 virus infection 12/06/2019   Elevated cholesterol    GERD (gastroesophageal reflux disease)    dx in work up 2016 for atypical CP at OSH   pt. denies   Heart murmur    Hypertensive retinopathy    OU   Hypothyroidism    Interstitial cystitis    sees Dr. [name]   Macular degeneration    OU   NSTEMI (non-ST elevated myocardial infarction) [facility])    Osteoporosis    Rectocele    S/P hip replacement, right 06/12/2017   Scoliosis    Thyroid disease    Hypothyroid   Urinary incontinence    USI   Uterine prolapse    Past Surgical History:  Procedure Laterality Date   BLADDER SUSPENSION  2011   CATARACT EXTRACTION Bilateral 2015   Dr. Elmer Picker   CORONARY ANGIOPLASTY WITH STENT PLACEMENT  04/21/2010   LAD 80%, RCA 30%, nl EF, s/p DES LAD   EYE SURGERY     LEFT HEART CATH AND CORONARY ANGIOGRAPHY N/A 06/14/2017   Procedure: LEFT HEART CATH AND CORONARY ANGIOGRAPHY;  Surgeon: Runell Gess, MD;  Location: [facility];  Service: Cardiovascular;  Laterality: N/A;   OOPHORECTOMY  2011   BSO   TOTAL HIP ARTHROPLASTY Right 06/10/2017   Procedure: RIGHT TOTAL HIP ARTHROPLASTY ANTERIOR APPROACH;  Surgeon: Samson Frederic, MD;  Location: [facility];  Service: Orthopedics;  Laterality:  Right;  Needs RNFA   VAGINAL HYSTERECTOMY  2011   LAVH BSO; benign   Patient Active Problem List   Diagnosis Date Noted   Aortic atherosclerosis (HCC) 12/07/2019   Macular degeneration 01/11/2019   Coronary artery disease involving native coronary artery of native heart without angina pectoris 10/29/2018   Presence of intraocular lens 09/23/2018   Bradycardia 11/11/2017   Dyslipidemia 11/11/2017   Pain in joint of right hip 08/04/2017   CAD (coronary artery disease) 06/12/2017   GERD (gastroesophageal reflux disease) 06/12/2017   S/P hip replacement, right 06/12/2017   Osteoarthritis of right hip 06/10/2017   CAD S/P percutaneous coronary angioplasty 02/11/2015   Renal insufficiency 02/11/2015   Prolonged Q-T interval on ECG 04/18/2010   Hypertension 08/11/2007   Hypothyroidism (acquired) 06/08/2007   MITRAL VALVE PROLAPSE 06/08/2007   PCP: Theodis Shove, MD   REFERRING PROVIDER: Renaldo Fiddler   REFERRING DIAG: Sacroiliitis M46.1   Rationale for Evaluation and Treatment Rehabilitation   THERAPY DIAG:  Sacroiliac joint dysfunction   Muscle weakness (generalized)   Chronic pain of left knee   ONSET DATE: 4 years  SUBJECTIVE:                                                                                                                                                                                            SUBJECTIVE STATEMENT:  03/18/2022 States that she was feeling good after last session but thinks she did something in water aerobics.   Eval: Patient complains of chronic left SIJ pain that has been managed with quarterly injections. Reports she also has left knee pain that wakes her up at night and this is her biggest complaint. States that she also uses heat and ice and this helps. She is active in water classes Tues/Thurs in the AM and would liek to go back to yoga.   PERTINENT HISTORY:  Scoliosis, R THA 5 years ago, history of cardiac issues   PAIN:   Are you having pain? Yes: NPRS scale: 3/10 Pain location: left knee pain Pain description: throbbing Aggravating factors: aggressive aquatic routine, long distance walking, grocery store Relieving factors: ice, rest     PRECAUTIONS: None   WEIGHT BEARING RESTRICTIONS No   FALLS:  Has patient fallen in last 6 months? No   LIVING ENVIRONMENT: Lives with: lives alone Lives in: House/apartment     OCCUPATION: not working, likes to carden in her potted plants    PLOF: Independent   PATIENT GOALS to not have left knee pain. To go back to yoga (stopped 7 years ago)     OBJECTIVE:    DIAGNOSTIC FINDINGS:  None recent at [facility]     SCREENING FOR RED FLAGS: Bowel or bladder incontinence: No Spinal tumors: No Cauda equina syndrome: No Compression fracture: No Abdominal aneurysm: No   COGNITION:           Overall cognitive status: Within functional limits for tasks assessed                              MUSCLE LENGTH: Hamstrings: Right 30 deg; Left 30 deg     POSTURE: S curve in spine, right hip higher than left, forward head.    PALPATION: Tenderness to palpation along glute meds and lumbar paraspinals              LE Measurements       Lower Extremity Right 03/03/2022 Left 03/03/2022    A/PROM MMT A/PROM MMT  Hip Flexion 105 4 90 4-  Hip Extension          Hip Abduction          Hip Adduction  Hip Internal rotation 45   5*    Hip External rotation 40   10    Knee Flexion 120 4 120* 4-  Knee Extension 0 4 8 lacking extension 4-  Ankle Dorsiflexion          Ankle Plantarflexion          Ankle Inversion          Ankle Eversion           (Blank rows = not tested)            * pain     LUMBAR SPECIAL TESTS:  Positive ely's test on left            TODAY'S TREATMENT  03/18/2022 Therapeutic Exercise:    Aerobic: Supine: 90/90 position - on ball 2 minutes B, hamstring iso 2 minutes 5" holds,  hip IR 2 minutes, bent knee fall outs 2 minutes, bridges  2x4 30 seconds S/l PROM left hip IR/ER and extension 6 minutes:      Manual Therapy: tibial bouncing L and R 5 minutes total - pain reduced,  Lumbar traction  on ball 5 minutes, STM to left glutes - tolerated well    Trigger Point Dry Needling:  Modalities:      PATIENT EDUCATION:  Education details: on HEP Person educated: Patient Education method: Programmer, multimedia, Facilities manager, and Handouts Education comprehension: verbalized understanding   HOME EXERCISE PROGRAM: [id number]   ASSESSMENT:   CLINICAL IMPRESSION: 03/18/2022 Continued to focus on mobility and strengthening exercises. This was tolerated well. No pain noted and reduced tension reported end of session. Added new exercises to HEP. Will continue with current POC as tolerated.   Eval: Patient complains of left SIJ and left knee pain that started years ago. SIJ pain is managed by injections and this will also help with the knee pain. Patient limited in left hip ROM in all directions with pain in knee presenting with left hip IR. Educated patient on findings and POC moving forward. Patient would greatly benefit from skilled PT to improve overall function and QOL.      OBJECTIVE IMPAIRMENTS Abnormal gait, decreased activity tolerance, decreased balance, decreased mobility, difficulty walking, decreased ROM, decreased strength, postural dysfunction, and pain.    ACTIVITY LIMITATIONS sleeping and locomotion level   PARTICIPATION LIMITATIONS: shopping, community activity, and yard work   PERSONAL FACTORS Age and 1-2 comorbidities: chronic left SIJ pain, R THA   are also affecting patient's functional outcome.    REHAB POTENTIAL: Good   CLINICAL DECISION MAKING: Stable/uncomplicated   EVALUATION COMPLEXITY: Low   GOALS: Goals reviewed with patient?  yes   SHORT TERM GOALS:   Patient will be independent in self management strategies to improve quality of life and functional outcomes. Baseline: new program Target date:  04/07/2022 Goal status: INITIAL   2.  Patient will report at least 50% improvement in overall symptoms and/or function to demonstrate improved functional mobility Baseline: 0% Target date: 04/07/2022 Goal status: INITIAL   3.  Patient will be able to report sleeping through the night without waking up secondary to pain in her left knee to improve sleep quality. Baseline: painful Target date: 04/07/2022 Goal status: INITIAL         LONG TERM GOALS:   Patient will report at least 75% improvement in overall symptoms and/or function to demonstrate improved functional mobility Baseline: 0% Target date: 05/12/2022 Goal status: INITIAL   2.  Patient will demonstrate at least 15 degrees of left  hip IR and ER to improve ROM in hip  Baseline: see above Target date: 05/12/2022 Goal status: INITIAL   3.  Patient will report ability to perform more LE exercises while in aquatics to improve strength and mobility. Baseline: unable Target date: 05/12/2022 Goal status: INITIAL             PLAN: PT FREQUENCY: 1-2x/week for total of 12 visits over 10 week certiication   PT DURATION: 10 weeks   PLANNED INTERVENTIONS: Therapeutic exercises, Therapeutic activity, Neuromuscular re-education, Balance training, Gait training, Patient/Family education, Self Care, Joint mobilization, Joint manipulation, Orthotic/Fit training, DME instructions, Aquatic Therapy, Dry Needling, Electrical stimulation, Spinal manipulation, Spinal mobilization, Cryotherapy, Moist heat, Ionotophoresis 4mg /ml Dexamethasone, and Manual therapy.   PLAN FOR NEXT SESSION: left hip STM, mobilizations, long axis traction, hip IR/ER Ext/flexion, isometrics and strengthening    2:37 PM, 03/18/22 03/20/22, DPT Physical Therapy with [facility]

## 2022-03-23 ENCOUNTER — Encounter: Payer: Self-pay | Admitting: Physical Therapy

## 2022-03-23 ENCOUNTER — Ambulatory Visit: Payer: Medicare Other | Admitting: Physical Therapy

## 2022-03-23 DIAGNOSIS — G8929 Other chronic pain: Secondary | ICD-10-CM

## 2022-03-23 DIAGNOSIS — M533 Sacrococcygeal disorders, not elsewhere classified: Secondary | ICD-10-CM | POA: Diagnosis not present

## 2022-03-23 DIAGNOSIS — M6281 Muscle weakness (generalized): Secondary | ICD-10-CM | POA: Diagnosis not present

## 2022-03-23 DIAGNOSIS — M25562 Pain in left knee: Secondary | ICD-10-CM | POA: Diagnosis not present

## 2022-03-23 NOTE — Therapy (Signed)
OUTPATIENT PHYSICAL THERAPY TREATMENT NOTE   Patient Name: Kaitlyn Parks MRN: [id number] DOB:[birth year], 87 y.o., female Today's Date: 03/23/2022   END OF SESSION:   PT End of Session - 03/23/22 1351     Visit Number 6    Number of Visits 12    Date for PT Re-Evaluation 05/12/22    Authorization Type [employer]    Progress Note Due on Visit 10    PT Start Time [id number]    PT Stop Time 1429    PT Time Calculation (min) 38 min             Past Medical History:  Diagnosis Date   Arthritis    DDD, scoliosis, sees Dr. Arnoldo Morale for this, uses norco very rarely for pain   Bradycardia 11/11/2017   CAD (coronary artery disease)    LAD stenting of a 90% lesion 2012   Cystocele    Educated about COVID-19 virus infection 12/06/2019   Elevated cholesterol    GERD (gastroesophageal reflux disease)    dx in work up 2016 for atypical CP at [facility]   pt. denies   Heart murmur    Hypertensive retinopathy    OU   Hypothyroidism    Interstitial cystitis    sees Dr. Jeffie Pollock   Macular degeneration    OU   NSTEMI (non-ST elevated myocardial infarction) [facility])    Osteoporosis    Rectocele    S/P hip replacement, right 06/12/2017   Scoliosis    Thyroid disease    Hypothyroid   Urinary incontinence    USI   Uterine prolapse    Past Surgical History:  Procedure Laterality Date   BLADDER SUSPENSION  2011   CATARACT EXTRACTION Bilateral 2015   Dr. Herbert Deaner   CORONARY ANGIOPLASTY WITH STENT PLACEMENT  04/21/2010   LAD 80%, RCA 30%, nl EF, s/p DES LAD   EYE SURGERY     LEFT HEART CATH AND CORONARY ANGIOGRAPHY N/A 06/14/2017   Procedure: LEFT HEART CATH AND CORONARY ANGIOGRAPHY;  Surgeon: Lorretta Harp, MD;  Location: [place] [facility];  Service: Cardiovascular;  Laterality: N/A;   OOPHORECTOMY  2011   BSO   TOTAL HIP ARTHROPLASTY Right 06/10/2017   Procedure: RIGHT TOTAL HIP ARTHROPLASTY ANTERIOR APPROACH;  Surgeon: Rod Can, MD;  Location: [facility];  Service: Orthopedics;  Laterality:  Right;  Needs RNFA   VAGINAL HYSTERECTOMY  2011   LAVH BSO; benign   Patient Active Problem List   Diagnosis Date Noted   Aortic atherosclerosis ([place]) 12/07/2019   Macular degeneration 01/11/2019   Coronary artery disease involving native coronary artery of native heart without angina pectoris 10/29/2018   Presence of intraocular lens 09/23/2018   Bradycardia 11/11/2017   Dyslipidemia 11/11/2017   Pain in joint of right hip 08/04/2017   CAD (coronary artery disease) 06/12/2017   GERD (gastroesophageal reflux disease) 06/12/2017   S/P hip replacement, right 06/12/2017   Osteoarthritis of right hip 06/10/2017   CAD S/P percutaneous coronary angioplasty 02/11/2015   Renal insufficiency 02/11/2015   Prolonged Q-T interval on ECG 04/18/2010   Hypertension 08/11/2007   Hypothyroidism (acquired) 06/08/2007   MITRAL VALVE PROLAPSE 06/08/2007   PCP: [name], MD   REFERRING PROVIDER: Reece Agar   REFERRING DIAG: Sacroiliitis M46.1   Rationale for Evaluation and Treatment Rehabilitation   THERAPY DIAG:  Sacroiliac joint dysfunction   Muscle weakness (generalized)   Chronic pain of left knee   ONSET DATE: 4 years  SUBJECTIVE:                                                                                                                                                                                            SUBJECTIVE STATEMENT:  03/23/2022 States she has been doing alright. States she has had a busy day. States that she is still working on her knee.   Eval: Patient complains of chronic left SIJ pain that has been managed with quarterly injections. Reports she also has left knee pain that wakes her up at night and this is her biggest complaint. States that she also uses heat and ice and this helps. She is active in water classes Tues/Thurs in the AM and would liek to go back to yoga.   PERTINENT HISTORY:  Scoliosis, R THA 5 years ago, history of cardiac issues    PAIN:  Are you having pain? Yes: NPRS scale: 3/10 Pain location: left knee pain Pain description: throbbing Aggravating factors: aggressive aquatic routine, long distance walking, grocery store Relieving factors: ice, rest     PRECAUTIONS: None   WEIGHT BEARING RESTRICTIONS No   FALLS:  Has patient fallen in last 6 months? No   LIVING ENVIRONMENT: Lives with: lives alone Lives in: House/apartment     OCCUPATION: not working, likes to carden in her potted plants    PLOF: Independent   PATIENT GOALS to not have left knee pain. To go back to yoga (stopped 7 years ago)     OBJECTIVE:    DIAGNOSTIC FINDINGS:  None recent [facility]     SCREENING FOR RED FLAGS: Bowel or bladder incontinence: No Spinal tumors: No Cauda equina syndrome: No Compression fracture: No Abdominal aneurysm: No   COGNITION:           Overall cognitive status: Within functional limits for tasks assessed                              MUSCLE LENGTH: Hamstrings: Right 30 deg; Left 30 deg     POSTURE: S curve in spine, right hip higher than left, forward head.    PALPATION: Tenderness to palpation along glute meds and lumbar paraspinals              LE Measurements       Lower Extremity Right 03/03/2022 Left 03/03/2022    A/PROM MMT A/PROM MMT  Hip Flexion 105 4 90 4-  Hip Extension          Hip Abduction          Hip Adduction  Hip Internal rotation 45   5*    Hip External rotation 40   10    Knee Flexion 120 4 120* 4-  Knee Extension 0 4 8 lacking extension 4-  Ankle Dorsiflexion          Ankle Plantarflexion          Ankle Inversion          Ankle Eversion           (Blank rows = not tested)            * pain     LUMBAR SPECIAL TESTS:  Positive ely's test on left            TODAY'S TREATMENT  03/23/2022 Therapeutic Exercise:    Aerobic: Supine: 90/90 position - on ball 2 minutes B, hamstring iso 2 minutes 5" holds,  hip IR 2 minutes, bent knee fall outs 2 minutes,  ankle PF/DF and inv/ever all directions and focus on reducing knee motion, LAQs on bal B holds 10" holds x15, assited SLR for end range extension      Manual Therapy: tibial bouncing L and R 5 minutes total - pain reduced,  Lumbar traction  on ball 5 minutes, STM left knee and surrounding muscles. Percussion for vibration to left and right hip and left leg    Trigger Point Dry Needling:  Modalities:      PATIENT EDUCATION:  Education details: on HEP Person educated: Patient Education method: Consulting civil engineer, Media planner, and Handouts Education comprehension: verbalized understanding   HOME EXERCISE PROGRAM: [id number]   ASSESSMENT:   CLINICAL IMPRESSION: 03/23/2022 Session continued to focus on hip and ankle mobility with focus on minimizing knee motion with these exercises. Slight pain in knee noted with end range knee extension but this resolved with tibial bouncing on ball. Educated patient on use of percussion gun to help manage chronic conditions. Overall patient doing well and no pain noted end of session.  Eval: Patient complains of left SIJ and left knee pain that started years ago. SIJ pain is managed by injections and this will also help with the knee pain. Patient limited in left hip ROM in all directions with pain in knee presenting with left hip IR. Educated patient on findings and POC moving forward. Patient would greatly benefit from skilled PT to improve overall function and QOL.      OBJECTIVE IMPAIRMENTS Abnormal gait, decreased activity tolerance, decreased balance, decreased mobility, difficulty walking, decreased ROM, decreased strength, postural dysfunction, and pain.    ACTIVITY LIMITATIONS sleeping and locomotion level   PARTICIPATION LIMITATIONS: shopping, community activity, and yard work   PERSONAL FACTORS Age and 1-2 comorbidities: chronic left SIJ pain, R THA   are also affecting patient's functional outcome.    REHAB POTENTIAL: Good   CLINICAL DECISION  MAKING: Stable/uncomplicated   EVALUATION COMPLEXITY: Low   GOALS: Goals reviewed with patient?  yes   SHORT TERM GOALS:   Patient will be independent in self management strategies to improve quality of life and functional outcomes. Baseline: new program Target date: 04/07/2022 Goal status: INITIAL   2.  Patient will report at least 50% improvement in overall symptoms and/or function to demonstrate improved functional mobility Baseline: 0% Target date: 04/07/2022 Goal status: INITIAL   3.  Patient will be able to report sleeping through the night without waking up secondary to pain in her left knee to improve sleep quality. Baseline: painful Target date: 04/07/2022 Goal status: INITIAL  LONG TERM GOALS:   Patient will report at least 75% improvement in overall symptoms and/or function to demonstrate improved functional mobility Baseline: 0% Target date: 05/12/2022 Goal status: INITIAL   2.  Patient will demonstrate at least 15 degrees of left hip IR and ER to improve ROM in hip  Baseline: see above Target date: 05/12/2022 Goal status: INITIAL   3.  Patient will report ability to perform more LE exercises while in aquatics to improve strength and mobility. Baseline: unable Target date: 05/12/2022 Goal status: INITIAL             PLAN: PT FREQUENCY: 1-2x/week for total of 12 visits over 10 week certiication   PT DURATION: 10 weeks   PLANNED INTERVENTIONS: Therapeutic exercises, Therapeutic activity, Neuromuscular re-education, Balance training, Gait training, Patient/Family education, Self Care, Joint mobilization, Joint manipulation, Orthotic/Fit training, DME instructions, Aquatic Therapy, Dry Needling, Electrical stimulation, Spinal manipulation, Spinal mobilization, Cryotherapy, Moist heat, Ionotophoresis 4mg /ml Dexamethasone, and Manual therapy.   PLAN FOR NEXT SESSION: patient likes some form of manual (gentle STM, tibial bouncing) then exercises, gentle  knee ext,ankle and hip mobility L.; left hip STM, mobilizations, long axis traction, hip IR/ER Ext/flexion, isometrics and strengthening    2:43 PM, 03/23/22 03/25/22, DPT Physical Therapy with [facility]

## 2022-03-26 NOTE — Therapy (Signed)
OUTPATIENT PHYSICAL THERAPY TREATMENT NOTE   Patient Name: Kaitlyn Parks MRN: [id number] DOB:[birth year], 87 y.o., female Today's Date: 03/30/2022   END OF SESSION:   PT End of Session - 03/30/22 1430     Visit Number 7    Number of Visits 12    Date for PT Re-Evaluation 05/12/22    Authorization Type UHC    Progress Note Due on Visit 10    PT Start Time [id number]    PT Stop Time [id number]    PT Time Calculation (min) 43 min    Activity Tolerance Patient tolerated treatment well    Behavior During Therapy WFL for tasks assessed/performed              Past Medical History:  Diagnosis Date   Arthritis    DDD, scoliosis, sees Dr. Arnoldo Morale for this, uses norco very rarely for pain   Bradycardia 11/11/2017   CAD (coronary artery disease)    LAD stenting of a 90% lesion 2012   Cystocele    Educated about COVID-19 virus infection 12/06/2019   Elevated cholesterol    GERD (gastroesophageal reflux disease)    dx in work up 2016 for atypical CP at OSH   pt. denies   Heart murmur    Hypertensive retinopathy    OU   Hypothyroidism    Interstitial cystitis    sees Dr. Jeffie Pollock   Macular degeneration    OU   NSTEMI (non-ST elevated myocardial infarction) [facility])    Osteoporosis    Rectocele    S/P hip replacement, right 06/12/2017   Scoliosis    Thyroid disease    Hypothyroid   Urinary incontinence    USI   Uterine prolapse    Past Surgical History:  Procedure Laterality Date   BLADDER SUSPENSION  2011   CATARACT EXTRACTION Bilateral 2015   Dr. Herbert Deaner   CORONARY ANGIOPLASTY WITH STENT PLACEMENT  04/21/2010   LAD 80%, RCA 30%, nl EF, s/p DES LAD   EYE SURGERY     LEFT HEART CATH AND CORONARY ANGIOGRAPHY N/A 06/14/2017   Procedure: LEFT HEART CATH AND CORONARY ANGIOGRAPHY;  Surgeon: Lorretta Harp, MD;  Location: [place] [facility];  Service: Cardiovascular;  Laterality: N/A;   OOPHORECTOMY  2011   BSO   TOTAL HIP ARTHROPLASTY Right 06/10/2017   Procedure: RIGHT TOTAL HIP  ARTHROPLASTY ANTERIOR APPROACH;  Surgeon: Rod Can, MD;  Location: [facility];  Service: Orthopedics;  Laterality: Right;  Needs RNFA   VAGINAL HYSTERECTOMY  2011   LAVH BSO; benign   Patient Active Problem List   Diagnosis Date Noted   Aortic atherosclerosis ([place]) 12/07/2019   Macular degeneration 01/11/2019   Coronary artery disease involving native coronary artery of native heart without angina pectoris 10/29/2018   Presence of intraocular lens 09/23/2018   Bradycardia 11/11/2017   Dyslipidemia 11/11/2017   Pain in joint of right hip 08/04/2017   CAD (coronary artery disease) 06/12/2017   GERD (gastroesophageal reflux disease) 06/12/2017   S/P hip replacement, right 06/12/2017   Osteoarthritis of right hip 06/10/2017   CAD S/P percutaneous coronary angioplasty 02/11/2015   Renal insufficiency 02/11/2015   Prolonged Q-T interval on ECG 04/18/2010   Hypertension 08/11/2007   Hypothyroidism (acquired) 06/08/2007   MITRAL VALVE PROLAPSE 06/08/2007   PCP: [name], MD   REFERRING PROVIDER: Reece Agar   REFERRING DIAG: Sacroiliitis M46.1   Rationale for Evaluation and Treatment Rehabilitation   THERAPY DIAG:  Sacroiliac joint dysfunction  Muscle weakness (generalized)   Chronic pain of left knee   ONSET DATE: 4 years    SUBJECTIVE:                                                                                                                                                                                            SUBJECTIVE STATEMENT:  03/30/2022 States that she was not able to participate in her HEP while traveling. She continues to note significant pain in her L SI joint and knee.   Eval: Patient complains of chronic left SIJ pain that has been managed with quarterly injections. Reports she also has left knee pain that wakes her up at night and this is her biggest complaint. States that she also uses heat and ice and this helps. She is active in water  classes Tues/Thurs in the AM and would liek to go back to yoga.   PERTINENT HISTORY:  Scoliosis, R THA 5 years ago, history of cardiac issues   PAIN:  Are you having pain? Yes: NPRS scale: 3/10 Pain location: left knee pain Pain description: throbbing Aggravating factors: aggressive aquatic routine, long distance walking, grocery store Relieving factors: ice, rest     PRECAUTIONS: None   WEIGHT BEARING RESTRICTIONS No   FALLS:  Has patient fallen in last 6 months? No   LIVING ENVIRONMENT: Lives with: lives alone Lives in: House/apartment     OCCUPATION: not working, likes to carden in her potted plants    PLOF: Independent   PATIENT GOALS to not have left knee pain. To go back to yoga (stopped 7 years ago)     OBJECTIVE:    DIAGNOSTIC FINDINGS:  None recent at [facility]     SCREENING FOR RED FLAGS: Bowel or bladder incontinence: No Spinal tumors: No Cauda equina syndrome: No Compression fracture: No Abdominal aneurysm: No   COGNITION:           Overall cognitive status: Within functional limits for tasks assessed                              MUSCLE LENGTH: Hamstrings: Right 30 deg; Left 30 deg     POSTURE: S curve in spine, right hip higher than left, forward head.    PALPATION: Tenderness to palpation along glute meds and lumbar paraspinals              LE Measurements       Lower Extremity Right 03/03/2022 Left 03/03/2022    A/PROM MMT A/PROM MMT  Hip Flexion 105 4 90 4-  Hip Extension  Hip Abduction          Hip Adduction          Hip Internal rotation 45   5*    Hip External rotation 40   10    Knee Flexion 120 4 120* 4-  Knee Extension 0 4 8 lacking extension 4-  Ankle Dorsiflexion          Ankle Plantarflexion          Ankle Inversion          Ankle Eversion           (Blank rows = not tested)            * pain     LUMBAR SPECIAL TESTS:  Positive ely's test on left    TODAY'S TREATMENT  03/30/2022: Therapeutic Exercise:     Aerobic: Supine: 90/90 position - on ball 2 minutes B, hamstring iso 2 minutes 5" holds,  hip IR 2 minutes, bent knee fall outs 2 minutes, ankle PF/DF and inv/ever all directions and focus on reducing knee motion, LAQs on bal B holds 10" holds x15.  Manual Therapy: Pt tender to palpation with medial STM.  03/30/2022 Therapeutic Exercise:    Aerobic: Supine: 90/90 position - on ball 2 minutes B, hamstring iso 2 minutes 5" holds,  hip IR 2 minutes, bent knee fall outs 2 minutes, ankle PF/DF and inv/ever all directions and focus on reducing knee motion, LAQs on bal B holds 10" holds x15, assited SLR for end range extension     Manual Therapy: tibial bouncing L and R 5 minutes total - pain reduced,  Lumbar traction  on ball 5 minutes, STM left knee and surrounding muscles. Percussion for vibration to left and right hip and left leg  Trigger Point Dry Needling:  Modalities:      PATIENT EDUCATION:  Education details: on HEP Person educated: Patient Education method: Explanation, Facilities manager, and Handouts Education comprehension: verbalized understanding   HOME EXERCISE PROGRAM: [id number]   ASSESSMENT:   CLINICAL IMPRESSION: 03/30/2022: Pt reports frustration with her knee not touching the mat. She states that she was not able to do her HEP due to traveling. Session with continued focus on proximal hip strengthening and mobility along with HS stretches and ankle ROM. Pt with tenderness with palpation to medial HS with STM. She has significant tension noted. She was able to tolerate HS stretch, but after second round reported pain in her knee and SI joint. Pt tolerated all other prescribed exercises well. She departs with improved HS length, and no significant pain.   Eval: Patient complains of left SIJ and left knee pain that started years ago. SIJ pain is managed by injections and this will also help with the knee pain. Patient limited in left hip ROM in all directions with pain in knee  presenting with left hip IR. Educated patient on findings and POC moving forward. Patient would greatly benefit from skilled PT to improve overall function and QOL.      OBJECTIVE IMPAIRMENTS Abnormal gait, decreased activity tolerance, decreased balance, decreased mobility, difficulty walking, decreased ROM, decreased strength, postural dysfunction, and pain.    ACTIVITY LIMITATIONS sleeping and locomotion level   PARTICIPATION LIMITATIONS: shopping, community activity, and yard work   PERSONAL FACTORS Age and 1-2 comorbidities: chronic left SIJ pain, R THA   are also affecting patient's functional outcome.    REHAB POTENTIAL: Good   CLINICAL DECISION MAKING: Stable/uncomplicated   EVALUATION COMPLEXITY: Low  GOALS: Goals reviewed with patient?  yes   SHORT TERM GOALS:   Patient will be independent in self management strategies to improve quality of life and functional outcomes. Baseline: new program Target date: 04/07/2022 Goal status: INITIAL   2.  Patient will report at least 50% improvement in overall symptoms and/or function to demonstrate improved functional mobility Baseline: 0% Target date: 04/07/2022 Goal status: INITIAL   3.  Patient will be able to report sleeping through the night without waking up secondary to pain in her left knee to improve sleep quality. Baseline: painful Target date: 04/07/2022 Goal status: INITIAL         LONG TERM GOALS:   Patient will report at least 75% improvement in overall symptoms and/or function to demonstrate improved functional mobility Baseline: 0% Target date: 05/12/2022 Goal status: INITIAL   2.  Patient will demonstrate at least 15 degrees of left hip IR and ER to improve ROM in hip  Baseline: see above Target date: 05/12/2022 Goal status: INITIAL   3.  Patient will report ability to perform more LE exercises while in aquatics to improve strength and mobility. Baseline: unable Target date: 05/12/2022 Goal status:  INITIAL  PLAN: PT FREQUENCY: 1-2x/week for total of 12 visits over 10 week certiication   PT DURATION: 10 weeks   PLANNED INTERVENTIONS: Therapeutic exercises, Therapeutic activity, Neuromuscular re-education, Balance training, Gait training, Patient/Family education, Self Care, Joint mobilization, Joint manipulation, Orthotic/Fit training, DME instructions, Aquatic Therapy, Dry Needling, Electrical stimulation, Spinal manipulation, Spinal mobilization, Cryotherapy, Moist heat, Ionotophoresis 4mg /ml Dexamethasone, and Manual therapy.   PLAN FOR NEXT SESSION: patient likes some form of manual (gentle STM, tibial bouncing) then exercises, gentle knee ext,ankle and hip mobility L.; left hip STM, mobilizations, long axis traction, hip IR/ER Ext/flexion, isometrics and strengthening   PT, DPT 03/30/22  2:31 PM

## 2022-03-30 ENCOUNTER — Encounter: Payer: Self-pay | Admitting: Physical Therapy

## 2022-03-30 ENCOUNTER — Ambulatory Visit: Payer: Medicare Other | Admitting: Physical Therapy

## 2022-03-30 DIAGNOSIS — G8929 Other chronic pain: Secondary | ICD-10-CM

## 2022-03-30 DIAGNOSIS — M25562 Pain in left knee: Secondary | ICD-10-CM | POA: Diagnosis not present

## 2022-03-30 DIAGNOSIS — M6281 Muscle weakness (generalized): Secondary | ICD-10-CM | POA: Diagnosis not present

## 2022-03-30 DIAGNOSIS — M533 Sacrococcygeal disorders, not elsewhere classified: Secondary | ICD-10-CM

## 2022-04-01 ENCOUNTER — Encounter: Payer: Self-pay | Admitting: Physical Therapy

## 2022-04-01 ENCOUNTER — Ambulatory Visit: Payer: Medicare Other | Admitting: Physical Therapy

## 2022-04-01 DIAGNOSIS — M25562 Pain in left knee: Secondary | ICD-10-CM | POA: Diagnosis not present

## 2022-04-01 DIAGNOSIS — M6281 Muscle weakness (generalized): Secondary | ICD-10-CM | POA: Diagnosis not present

## 2022-04-01 DIAGNOSIS — G8929 Other chronic pain: Secondary | ICD-10-CM | POA: Diagnosis not present

## 2022-04-01 DIAGNOSIS — M533 Sacrococcygeal disorders, not elsewhere classified: Secondary | ICD-10-CM | POA: Diagnosis not present

## 2022-04-01 NOTE — Therapy (Signed)
OUTPATIENT PHYSICAL THERAPY TREATMENT NOTE   Patient Name: Kaitlyn Parks MRN: [id number] DOB:[birth year], 87 y.o., female Today's Date: 04/01/2022   END OF SESSION:   PT End of Session - 04/01/22 1349     Visit Number 8    Number of Visits 12    Date for PT Re-Evaluation 05/12/22    Authorization Type U[employer]    Progress Note Due on Visit 10    PT Start Time 1348    PT Stop Time 1426    PT Time Calculation (min) 38 min    Activity Tolerance Patient tolerated treatment well    Behavior During Therapy WFL for tasks assessed/performed              Past Medical History:  Diagnosis Date   Arthritis    DDD, scoliosis, sees Dr. Lovell Sheehan for this, uses norco very rarely for pain   Bradycardia 11/11/2017   CAD (coronary artery disease)    LAD stenting of a 90% lesion 2012   Cystocele    Educated about COVID-19 virus infection 12/06/2019   Elevated cholesterol    GERD (gastroesophageal reflux disease)    dx in work up 2016 for atypical CP at OSH   pt. denies   Heart murmur    Hypertensive retinopathy    OU   Hypothyroidism    Interstitial cystitis    sees Dr. [name]   Macular degeneration    OU   NSTEMI (non-ST elevated myocardial infarction) [facility])    Osteoporosis    Rectocele    S/P hip replacement, right 06/12/2017   Scoliosis    Thyroid disease    Hypothyroid   Urinary incontinence    USI   Uterine prolapse    Past Surgical History:  Procedure Laterality Date   BLADDER SUSPENSION  2011   CATARACT EXTRACTION Bilateral 2015   Dr. Elmer Picker   CORONARY ANGIOPLASTY WITH STENT PLACEMENT  04/21/2010   LAD 80%, RCA 30%, nl EF, s/p DES LAD   EYE SURGERY     LEFT HEART CATH AND CORONARY ANGIOGRAPHY N/A 06/14/2017   Procedure: LEFT HEART CATH AND CORONARY ANGIOGRAPHY;  Surgeon: Runell Gess, MD;  Location: [facility];  Service: Cardiovascular;  Laterality: N/A;   OOPHORECTOMY  2011   BSO   TOTAL HIP ARTHROPLASTY Right 06/10/2017   Procedure: RIGHT TOTAL HIP  ARTHROPLASTY ANTERIOR APPROACH;  Surgeon: Samson Frederic, MD;  Location: [facility];  Service: Orthopedics;  Laterality: Right;  Needs RNFA   VAGINAL HYSTERECTOMY  2011   LAVH BSO; benign   Patient Active Problem List   Diagnosis Date Noted   Aortic atherosclerosis (HCC) 12/07/2019   Macular degeneration 01/11/2019   Coronary artery disease involving native coronary artery of native heart without angina pectoris 10/29/2018   Presence of intraocular lens 09/23/2018   Bradycardia 11/11/2017   Dyslipidemia 11/11/2017   Pain in joint of right hip 08/04/2017   CAD (coronary artery disease) 06/12/2017   GERD (gastroesophageal reflux disease) 06/12/2017   S/P hip replacement, right 06/12/2017   Osteoarthritis of right hip 06/10/2017   CAD S/P percutaneous coronary angioplasty 02/11/2015   Renal insufficiency 02/11/2015   Prolonged Q-T interval on ECG 04/18/2010   Hypertension 08/11/2007   Hypothyroidism (acquired) 06/08/2007   MITRAL VALVE PROLAPSE 06/08/2007   PCP: Theodis Shove, MD   REFERRING PROVIDER: Renaldo Fiddler   REFERRING DIAG: Sacroiliitis M46.1   Rationale for Evaluation and Treatment Rehabilitation   THERAPY DIAG:  Sacroiliac joint dysfunction  Muscle weakness (generalized)   Chronic pain of left knee   ONSET DATE: 4 years    SUBJECTIVE:                                                                                                                                                                                            SUBJECTIVE STATEMENT:  04/01/2022 States that her leg is bothering her a little bit but she went to water aerobics. States that [place] really helped with her leg/knee last session.  Eval: Patient complains of chronic left SIJ pain that has been managed with quarterly injections. Reports she also has left knee pain that wakes her up at night and this is her biggest complaint. States that she also uses heat and ice and this helps. She is active in  water classes Tues/Thurs in the AM and would liek to go back to yoga.   PERTINENT HISTORY:  Scoliosis, R THA 5 years ago, history of cardiac issues   PAIN:  Are you having pain? Yes: NPRS scale: 3/10 Pain location: left knee pain Pain description: throbbing Aggravating factors: aggressive aquatic routine, long distance walking, grocery store Relieving factors: ice, rest     PRECAUTIONS: None   WEIGHT BEARING RESTRICTIONS No   FALLS:  Has patient fallen in last 6 months? No   LIVING ENVIRONMENT: Lives with: lives alone Lives in: House/apartment     OCCUPATION: not working, likes to carden in her potted plants    PLOF: Independent   PATIENT GOALS to not have left knee pain. To go back to yoga (stopped 7 years ago)     OBJECTIVE:    DIAGNOSTIC FINDINGS:  None recent at [facility]     SCREENING FOR RED FLAGS: Bowel or bladder incontinence: No Spinal tumors: No Cauda equina syndrome: No Compression fracture: No Abdominal aneurysm: No   COGNITION:           Overall cognitive status: Within functional limits for tasks assessed                              MUSCLE LENGTH: Hamstrings: Right 30 deg; Left 30 deg     POSTURE: S curve in spine, right hip higher than left, forward head.    PALPATION: Tenderness to palpation along glute meds and lumbar paraspinals              LE Measurements       Lower Extremity Right 03/03/2022 Left 03/03/2022    A/PROM MMT A/PROM MMT  Hip Flexion 105 4 90 4-  Hip Extension  Hip Abduction          Hip Adduction          Hip Internal rotation 45   5*    Hip External rotation 40   10    Knee Flexion 120 4 120* 4-  Knee Extension 0 4 8 lacking extension 4-  Ankle Dorsiflexion          Ankle Plantarflexion          Ankle Inversion          Ankle Eversion           (Blank rows = not tested)            * pain     LUMBAR SPECIAL TESTS:  Positive ely's test on left    TODAY'S TREATMENT    04/01/2022 Therapeutic  Exercise:    Aerobic: Supine: 90/90 position - on ball 2 minutes B, push pull iso 5x5 5" hlds B, bent knee fall outs 2 minutes, bent knee fall ins alternating 3 minutes Seated: self mobilization to legs with percussion gun and TOWELS for comfort/barrier 8 minutes       Manual Therapy: tibial bouncing L and R 5 minutes total - pain reduced, lateral hip mobilization grade II left    Lumbar traction  on ball 5 minutes, STM left knee and surrounding muscles. Percussion for vibration to left and right hip and left leg  Trigger Point Dry Needling:  Modalities:      PATIENT EDUCATION:  Education details: on HEP Person educated: Patient Education method: Programmer, multimedia, Facilities manager, and Handouts Education comprehension: verbalized understanding   HOME EXERCISE PROGRAM: [id number]   ASSESSMENT:   CLINICAL IMPRESSION: 04/01/2022 Improved hip IR noted after lateral hip mobilization with patient in hook lying. Occasional discomfort in knee with hip exercises but increased knee ankle and this helped alleviate pain/discomfort. Overall patient is doing well and would continue to benefit from skilled PT to improve overall QOL.  Eval: Patient complains of left SIJ and left knee pain that started years ago. SIJ pain is managed by injections and this will also help with the knee pain. Patient limited in left hip ROM in all directions with pain in knee presenting with left hip IR. Educated patient on findings and POC moving forward. Patient would greatly benefit from skilled PT to improve overall function and QOL.      OBJECTIVE IMPAIRMENTS Abnormal gait, decreased activity tolerance, decreased balance, decreased mobility, difficulty walking, decreased ROM, decreased strength, postural dysfunction, and pain.    ACTIVITY LIMITATIONS sleeping and locomotion level   PARTICIPATION LIMITATIONS: shopping, community activity, and yard work   PERSONAL FACTORS Age and 1-2 comorbidities: chronic left SIJ pain,  R THA   are also affecting patient's functional outcome.    REHAB POTENTIAL: Good   CLINICAL DECISION MAKING: Stable/uncomplicated   EVALUATION COMPLEXITY: Low   GOALS: Goals reviewed with patient?  yes   SHORT TERM GOALS:   Patient will be independent in self management strategies to improve quality of life and functional outcomes. Baseline: new program Target date: 04/07/2022 Goal status: INITIAL   2.  Patient will report at least 50% improvement in overall symptoms and/or function to demonstrate improved functional mobility Baseline: 0% Target date: 04/07/2022 Goal status: INITIAL   3.  Patient will be able to report sleeping through the night without waking up secondary to pain in her left knee to improve sleep quality. Baseline: painful Target date: 04/07/2022 Goal status: INITIAL  LONG TERM GOALS:   Patient will report at least 75% improvement in overall symptoms and/or function to demonstrate improved functional mobility Baseline: 0% Target date: 05/12/2022 Goal status: INITIAL   2.  Patient will demonstrate at least 15 degrees of left hip IR and ER to improve ROM in hip  Baseline: see above Target date: 05/12/2022 Goal status: INITIAL   3.  Patient will report ability to perform more LE exercises while in aquatics to improve strength and mobility. Baseline: unable Target date: 05/12/2022 Goal status: INITIAL  PLAN: PT FREQUENCY: 1-2x/week for total of 12 visits over 10 week certiication   PT DURATION: 10 weeks   PLANNED INTERVENTIONS: Therapeutic exercises, Therapeutic activity, Neuromuscular re-education, Balance training, Gait training, Patient/Family education, Self Care, Joint mobilization, Joint manipulation, Orthotic/Fit training, DME instructions, Aquatic Therapy, Dry Needling, Electrical stimulation, Spinal manipulation, Spinal mobilization, Cryotherapy, Moist heat, Ionotophoresis 4mg /ml Dexamethasone, and Manual therapy.   PLAN FOR NEXT  SESSION: patient likes some form of manual (gentle STM, tibial bouncing) then exercises, gentle knee ext,ankle and hip mobility L.; left hip STM, mobilizations, long axis traction, hip IR/ER Ext/flexion, isometrics and strengthening   2:31 PM, 04/01/22 04/03/22, DPT Physical Therapy with [facility]

## 2022-04-02 NOTE — Therapy (Signed)
OUTPATIENT PHYSICAL THERAPY TREATMENT NOTE   Patient Name: Kaitlyn Parks MRN: [id number] DOB:21-Sep-1934, 87 y.o., female Today's Date: 04/07/2022   END OF SESSION:   PT End of Session - 04/07/22 1442     Visit Number 9    Number of Visits 12    Date for PT Re-Evaluation 05/12/22    Authorization Type UHC    Progress Note Due on Visit 10    PT Start Time 1345    PT Stop Time 1430    PT Time Calculation (min) 45 min    Activity Tolerance Patient tolerated treatment well    Behavior During Therapy [facility] for tasks assessed/performed               Past Medical History:  Diagnosis Date   Arthritis    DDD, scoliosis, sees Dr. Lovell Sheehan for this, uses norco very rarely for pain   Bradycardia 11/11/2017   CAD (coronary artery disease)    LAD stenting of a 90% lesion 2012   Cystocele    Educated about COVID-19 virus infection 12/06/2019   Elevated cholesterol    GERD (gastroesophageal reflux disease)    dx in work up 2016 for atypical CP at OSH   pt. denies   Heart murmur    Hypertensive retinopathy    OU   Hypothyroidism    Interstitial cystitis    sees Dr. [name]   Macular degeneration    OU   NSTEMI (non-ST elevated myocardial infarction) [facility])    Osteoporosis    Rectocele    S/P hip replacement, right 06/12/2017   Scoliosis    Thyroid disease    Hypothyroid   Urinary incontinence    USI   Uterine prolapse    Past Surgical History:  Procedure Laterality Date   BLADDER SUSPENSION  2011   CATARACT EXTRACTION Bilateral 2015   Dr. Elmer Picker   CORONARY ANGIOPLASTY WITH STENT PLACEMENT  04/21/2010   LAD 80%, RCA 30%, nl EF, s/p DES LAD   EYE SURGERY     LEFT HEART CATH AND CORONARY ANGIOGRAPHY N/A 06/14/2017   Procedure: LEFT HEART CATH AND CORONARY ANGIOGRAPHY;  Surgeon: Runell Gess, MD;  Location: [facility];  Service: Cardiovascular;  Laterality: N/A;   OOPHORECTOMY  2011   BSO   TOTAL HIP ARTHROPLASTY Right 06/10/2017   Procedure: RIGHT TOTAL HIP  ARTHROPLASTY ANTERIOR APPROACH;  Surgeon: Samson Frederic, MD;  Location: [facility];  Service: Orthopedics;  Laterality: Right;  Needs RNFA   VAGINAL HYSTERECTOMY  2011   LAVH BSO; benign   Patient Active Problem List   Diagnosis Date Noted   Aortic atherosclerosis (HCC) 12/07/2019   Macular degeneration 01/11/2019   Coronary artery disease involving native coronary artery of native heart without angina pectoris 10/29/2018   Presence of intraocular lens 09/23/2018   Bradycardia 11/11/2017   Dyslipidemia 11/11/2017   Pain in joint of right hip 08/04/2017   CAD (coronary artery disease) 06/12/2017   GERD (gastroesophageal reflux disease) 06/12/2017   S/P hip replacement, right 06/12/2017   Osteoarthritis of right hip 06/10/2017   CAD S/P percutaneous coronary angioplasty 02/11/2015   Renal insufficiency 02/11/2015   Prolonged Q-T interval on ECG 04/18/2010   Hypertension 08/11/2007   Hypothyroidism (acquired) 06/08/2007   MITRAL VALVE PROLAPSE 06/08/2007   PCP: Theodis Shove, MD   REFERRING PROVIDER: Renaldo Fiddler   REFERRING DIAG: Sacroiliitis M46.1   Rationale for Evaluation and Treatment Rehabilitation   THERAPY DIAG:  Sacroiliac joint dysfunction  Muscle weakness (generalized)   Chronic pain of left knee   ONSET DATE: 4 years    SUBJECTIVE:                                                                                                                                                                                            SUBJECTIVE STATEMENT: 04/07/2022:  Pt reports continued pain in her lateral gastroc/ posterior tib. She states that it feels like a burning sensation. No reports on her SIJ today.   Eval: Patient complains of chronic left SIJ pain that has been managed with quarterly injections. Reports she also has left knee pain that wakes her up at night and this is her biggest complaint. States that she also uses heat and ice and this helps. She is active in  water classes Tues/Thurs in the AM and would liek to go back to yoga.   PERTINENT HISTORY:  Scoliosis, R THA 5 years ago, history of cardiac issues   PAIN:  Are you having pain? Yes: NPRS scale: 3/10 Pain location: left knee pain Pain description: throbbing Aggravating factors: aggressive aquatic routine, long distance walking, grocery store Relieving factors: ice, rest     PRECAUTIONS: None   WEIGHT BEARING RESTRICTIONS No   FALLS:  Has patient fallen in last 6 months? No   LIVING ENVIRONMENT: Lives with: lives alone Lives in: House/apartment     OCCUPATION: not working, likes to carden in her potted plants    PLOF: Independent   PATIENT GOALS to not have left knee pain. To go back to yoga (stopped 7 years ago)     OBJECTIVE:    DIAGNOSTIC FINDINGS:  None recent at [facility]     SCREENING FOR RED FLAGS: Bowel or bladder incontinence: No Spinal tumors: No Cauda equina syndrome: No Compression fracture: No Abdominal aneurysm: No   COGNITION:           Overall cognitive status: Within functional limits for tasks assessed                              MUSCLE LENGTH: Hamstrings: Right 30 deg; Left 30 deg     POSTURE: S curve in spine, right hip higher than left, forward head.    PALPATION: Tenderness to palpation along glute meds and lumbar paraspinals              LE Measurements       Lower Extremity Right 03/03/2022 Left 03/03/2022    A/PROM MMT A/PROM MMT  Hip Flexion 105 4 90 4-  Hip Extension  Hip Abduction          Hip Adduction          Hip Internal rotation 45   5*    Hip External rotation 40   10    Knee Flexion 120 4 120* 4-  Knee Extension 0 4 8 lacking extension 4-  Ankle Dorsiflexion          Ankle Plantarflexion          Ankle Inversion          Ankle Eversion           (Blank rows = not tested)            * pain     LUMBAR SPECIAL TESTS:  Positive ely's test on left    TODAY'S TREATMENT  04/07/2022:     Aerobic: Supine:  90/90 position - on ball 2 minutes B, push pull iso 5x5 5" hlds B, bent knee fall outs 2 minutes, supine clams with RTB x20, ball squeezes x20 Tibial bouncing L, STM to lateral gastoc and posterior tib, quad until tissue change noted.    04/07/2022 Therapeutic Exercise:    Aerobic: Supine: 90/90 position - on ball 2 minutes B, push pull iso 5x5 5" hlds B, bent knee fall outs 2 minutes, bent knee fall ins alternating 3 minutes Seated: self mobilization to legs with percussion gun and TOWELS for comfort/barrier 8 minutes      Manual Therapy: tibial bouncing L and R 5 minutes total - pain reduced, lateral hip mobilization grade II left    Lumbar traction  on ball 5 minutes, STM left knee and surrounding muscles. Percussion for vibration to left and right hip and left leg  Trigger Point Dry Needling:  Modalities:      PATIENT EDUCATION:  Education details: on HEP Person educated: Patient Education method: Programmer, multimedia, Facilities manager, and Handouts Education comprehension: verbalized understanding   HOME EXERCISE PROGRAM: [id number]   ASSESSMENT:   CLINICAL IMPRESSION:  04/07/2022: Pt presents to PT with continued gait deviations and pain reported in lateral gastroc/ posterior tib. She reports being active with water aerobics this morning and getting stiff after when getting her hair done. Session with focus on manual STM to release restrictions and tension noted with improvements in pain and knee ROM following. She was able to perform all exercises today with no reported pain. Pt will continue to benefit from skilled PT to address continued deficits.   Eval: Patient complains of left SIJ and left knee pain that started years ago. SIJ pain is managed by injections and this will also help with the knee pain. Patient limited in left hip ROM in all directions with pain in knee presenting with left hip IR. Educated patient on findings and POC moving forward. Patient would greatly benefit from  skilled PT to improve overall function and QOL.      OBJECTIVE IMPAIRMENTS Abnormal gait, decreased activity tolerance, decreased balance, decreased mobility, difficulty walking, decreased ROM, decreased strength, postural dysfunction, and pain.    ACTIVITY LIMITATIONS sleeping and locomotion level   PARTICIPATION LIMITATIONS: shopping, community activity, and yard work   PERSONAL FACTORS Age and 1-2 comorbidities: chronic left SIJ pain, R THA   are also affecting patient's functional outcome.    REHAB POTENTIAL: Good   CLINICAL DECISION MAKING: Stable/uncomplicated   EVALUATION COMPLEXITY: Low   GOALS: Goals reviewed with patient?  yes   SHORT TERM GOALS:   Patient will be independent in self management strategies  to improve quality of life and functional outcomes. Baseline: new program Target date: 04/07/2022 Goal status: INITIAL   2.  Patient will report at least 50% improvement in overall symptoms and/or function to demonstrate improved functional mobility Baseline: 0% Target date: 04/07/2022 Goal status: INITIAL   3.  Patient will be able to report sleeping through the night without waking up secondary to pain in her left knee to improve sleep quality. Baseline: painful Target date: 04/07/2022 Goal status: INITIAL         LONG TERM GOALS:   Patient will report at least 75% improvement in overall symptoms and/or function to demonstrate improved functional mobility Baseline: 0% Target date: 05/12/2022 Goal status: INITIAL   2.  Patient will demonstrate at least 15 degrees of left hip IR and ER to improve ROM in hip  Baseline: see above Target date: 05/12/2022 Goal status: INITIAL   3.  Patient will report ability to perform more LE exercises while in aquatics to improve strength and mobility. Baseline: unable Target date: 05/12/2022 Goal status: INITIAL  PLAN: PT FREQUENCY: 1-2x/week for total of 12 visits over 10 week certiication   PT DURATION: 10 weeks    PLANNED INTERVENTIONS: Therapeutic exercises, Therapeutic activity, Neuromuscular re-education, Balance training, Gait training, Patient/Family education, Self Care, Joint mobilization, Joint manipulation, Orthotic/Fit training, DME instructions, Aquatic Therapy, Dry Needling, Electrical stimulation, Spinal manipulation, Spinal mobilization, Cryotherapy, Moist heat, Ionotophoresis 4mg /ml Dexamethasone, and Manual therapy.   PLAN FOR NEXT SESSION: patient likes some form of manual (gentle STM, tibial bouncing) then exercises, gentle knee ext,ankle and hip mobility L.; left hip STM, mobilizations, long axis traction, hip IR/ER Ext/flexion, isometrics and strengthening   PT, DPT 04/07/22  2:44 PM

## 2022-04-07 ENCOUNTER — Ambulatory Visit: Payer: Medicare Other | Admitting: Physical Therapy

## 2022-04-07 ENCOUNTER — Encounter: Payer: Self-pay | Admitting: Physical Therapy

## 2022-04-07 DIAGNOSIS — M533 Sacrococcygeal disorders, not elsewhere classified: Secondary | ICD-10-CM | POA: Diagnosis not present

## 2022-04-07 DIAGNOSIS — M6281 Muscle weakness (generalized): Secondary | ICD-10-CM

## 2022-04-07 DIAGNOSIS — M25562 Pain in left knee: Secondary | ICD-10-CM

## 2022-04-07 DIAGNOSIS — G8929 Other chronic pain: Secondary | ICD-10-CM | POA: Diagnosis not present

## 2022-04-07 NOTE — Therapy (Signed)
OUTPATIENT PHYSICAL THERAPY TREATMENT NOTE   Patient Name: Kaitlyn Parks MRN: [id number] DOB:1935-04-02, 87 y.o., female Today's Date: 04/09/2022   END OF SESSION:   PT End of Session - 04/09/22 1430     Visit Number 10    Number of Visits 12    Date for PT Re-Evaluation 05/12/22    Authorization Type [employer]    PT Start Time 4098    PT Stop Time 1191    PT Time Calculation (min) 43 min    Activity Tolerance Patient tolerated treatment well    Behavior During Therapy WFL for tasks assessed/performed                Past Medical History:  Diagnosis Date   Arthritis    DDD, scoliosis, sees Dr. Arnoldo Morale for this, uses norco very rarely for pain   Bradycardia 11/11/2017   CAD (coronary artery disease)    LAD stenting of a 90% lesion 2012   Cystocele    Educated about COVID-19 virus infection 12/06/2019   Elevated cholesterol    GERD (gastroesophageal reflux disease)    dx in work up 2016 for atypical CP at OSH   pt. denies   Heart murmur    Hypertensive retinopathy    OU   Hypothyroidism    Interstitial cystitis    sees Dr. Jeffie Pollock   Macular degeneration    OU   NSTEMI (non-ST elevated myocardial infarction) [facility])    Osteoporosis    Rectocele    S/P hip replacement, right 06/12/2017   Scoliosis    Thyroid disease    Hypothyroid   Urinary incontinence    USI   Uterine prolapse    Past Surgical History:  Procedure Laterality Date   BLADDER SUSPENSION  2011   CATARACT EXTRACTION Bilateral 2015   Dr. Herbert Deaner   CORONARY ANGIOPLASTY WITH STENT PLACEMENT  04/21/2010   LAD 80%, RCA 30%, nl EF, s/p DES LAD   EYE SURGERY     LEFT HEART CATH AND CORONARY ANGIOGRAPHY N/A 06/14/2017   Procedure: LEFT HEART CATH AND CORONARY ANGIOGRAPHY;  Surgeon: Lorretta Harp, MD;  Location: [facility];  Service: Cardiovascular;  Laterality: N/A;   OOPHORECTOMY  2011   BSO   TOTAL HIP ARTHROPLASTY Right 06/10/2017   Procedure: RIGHT TOTAL HIP ARTHROPLASTY ANTERIOR APPROACH;   Surgeon: Rod Can, MD;  Location: [facility];  Service: Orthopedics;  Laterality: Right;  Needs RNFA   VAGINAL HYSTERECTOMY  2011   LAVH BSO; benign   Patient Active Problem List   Diagnosis Date Noted   Aortic atherosclerosis ([place]) 12/07/2019   Macular degeneration 01/11/2019   Coronary artery disease involving native coronary artery of native heart without angina pectoris 10/29/2018   Presence of intraocular lens 09/23/2018   Bradycardia 11/11/2017   Dyslipidemia 11/11/2017   Pain in joint of right hip 08/04/2017   CAD (coronary artery disease) 06/12/2017   GERD (gastroesophageal reflux disease) 06/12/2017   S/P hip replacement, right 06/12/2017   Osteoarthritis of right hip 06/10/2017   CAD S/P percutaneous coronary angioplasty 02/11/2015   Renal insufficiency 02/11/2015   Prolonged Q-T interval on ECG 04/18/2010   Hypertension 08/11/2007   Hypothyroidism (acquired) 06/08/2007   MITRAL VALVE PROLAPSE 06/08/2007   PCP: [name], MD   REFERRING PROVIDER: Reece Agar   REFERRING DIAG: Sacroiliitis M46.1   Rationale for Evaluation and Treatment Rehabilitation   THERAPY DIAG:  Sacroiliac joint dysfunction   Muscle weakness (generalized)   Chronic  pain of left knee   ONSET DATE: 4 years    SUBJECTIVE:                                                                                                                                                                                            SUBJECTIVE STATEMENT: 04/09/2022:  Pt states that she strained her back after helping a friend move a chair. She reports improvements in her knee mobility today.   Eval: Patient complains of chronic left SIJ pain that has been managed with quarterly injections. Reports she also has left knee pain that wakes her up at night and this is her biggest complaint. States that she also uses heat and ice and this helps. She is active in water classes Tues/Thurs in the AM and would liek  to go back to yoga.   PERTINENT HISTORY:  Scoliosis, R THA 5 years ago, history of cardiac issues   PAIN:  Are you having pain? Yes: NPRS scale: 3/10 Pain location: left knee pain Pain description: throbbing Aggravating factors: aggressive aquatic routine, long distance walking, grocery store Relieving factors: ice, rest     PRECAUTIONS: None   WEIGHT BEARING RESTRICTIONS No   FALLS:  Has patient fallen in last 6 months? No   LIVING ENVIRONMENT: Lives with: lives alone Lives in: House/apartment     OCCUPATION: not working, likes to carden in her potted plants    PLOF: Independent   PATIENT GOALS to not have left knee pain. To go back to yoga (stopped 7 years ago)     OBJECTIVE:    DIAGNOSTIC FINDINGS:  None recent at [facility]     SCREENING FOR RED FLAGS: Bowel or bladder incontinence: No Spinal tumors: No Cauda equina syndrome: No Compression fracture: No Abdominal aneurysm: No   COGNITION:           Overall cognitive status: Within functional limits for tasks assessed                              MUSCLE LENGTH: Hamstrings: Right 30 deg; Left 30 deg     POSTURE: S curve in spine, right hip higher than left, forward head.    PALPATION: Tenderness to palpation along glute meds and lumbar paraspinals              LE Measurements       Lower Extremity Right 03/03/2022 Left 03/03/2022    A/PROM MMT A/PROM MMT  Hip Flexion 105 4 90 4-  Hip Extension          Hip Abduction  Hip Adduction          Hip Internal rotation 45   5*    Hip External rotation 40   10    Knee Flexion 120 4 120* 4-  Knee Extension 0 4 8 lacking extension 4-  Ankle Dorsiflexion          Ankle Plantarflexion          Ankle Inversion          Ankle Eversion           (Blank rows = not tested)            * pain     LUMBAR SPECIAL TESTS:  Positive ely's test on left    TODAY'S TREATMENT  04/09/2022:     Aerobic: Supine: 90/90 position - on ball 2 minutes B, bent knee  fall outs 2 minutes, supine clams with RTB x20, ball squeezes x20, ball squeeze with LAQ x20, supine marching RTB to fatigue, bridges x20, Quad set with OP into extension x10 STM to HS, lateral gastroc until tissue change noted.    04/09/2022 Therapeutic Exercise:    Aerobic: Supine: 90/90 position - on ball 2 minutes B, push pull iso 5x5 5" hlds B, bent knee fall outs 2 minutes, bent knee fall ins alternating 3 minutes Seated: self mobilization to legs with percussion gun and TOWELS for comfort/barrier 8 minutes      Manual Therapy: tibial bouncing L and R 5 minutes total - pain reduced, lateral hip mobilization grade II left    Lumbar traction  on ball 5 minutes, STM left knee and surrounding muscles. Percussion for vibration to left and right hip and left leg  Trigger Point Dry Needling:  Modalities:      PATIENT EDUCATION:  Education details: on HEP Person educated: Patient Education method: Consulting civil engineer, Media planner, and Handouts Education comprehension: verbalized understanding   HOME EXERCISE PROGRAM: [id number]   ASSESSMENT:   CLINICAL IMPRESSION:  04/07/2022: Pt presents to PT with continued gait deviations but improvements reported with pain today. Session with focus on manual STM to release restrictions and tension noted with improvements in pain and knee ROM following. She was able to perform all exercises today with no reported pain. Further challenged pt with quad sets today with improvements into knee extension noted.  She departs today's session with improvements in gait with increased stance time on L LE. Pt will continue to benefit from skilled PT to address continued deficits.   Eval: Patient complains of left SIJ and left knee pain that started years ago. SIJ pain is managed by injections and this will also help with the knee pain. Patient limited in left hip ROM in all directions with pain in knee presenting with left hip IR. Educated patient on findings and POC  moving forward. Patient would greatly benefit from skilled PT to improve overall function and QOL.      OBJECTIVE IMPAIRMENTS Abnormal gait, decreased activity tolerance, decreased balance, decreased mobility, difficulty walking, decreased ROM, decreased strength, postural dysfunction, and pain.    ACTIVITY LIMITATIONS sleeping and locomotion level   PARTICIPATION LIMITATIONS: shopping, community activity, and yard work   PERSONAL FACTORS Age and 1-2 comorbidities: chronic left SIJ pain, R THA   are also affecting patient's functional outcome.    REHAB POTENTIAL: Good   CLINICAL DECISION MAKING: Stable/uncomplicated   EVALUATION COMPLEXITY: Low   GOALS: Goals reviewed with patient?  yes   SHORT TERM GOALS:   Patient will be independent  in self management strategies to improve quality of life and functional outcomes. Baseline: new program Target date: 04/07/2022 Goal status: MET    2.  Patient will report at least 50% improvement in overall symptoms and/or function to demonstrate improved functional mobility Baseline: 0% Target date: 04/07/2022 Goal status: MET    3.  Patient will be able to report sleeping through the night without waking up secondary to pain in her left knee to improve sleep quality. Baseline: painful Target date: 04/07/2022 Goal status: ONGOING, can sleep through night if pain meds taken.          LONG TERM GOALS:   Patient will report at least 75% improvement in overall symptoms and/or function to demonstrate improved functional mobility Baseline: 0% Target date: 05/12/2022 Goal status: INITIAL   2.  Patient will demonstrate at least 15 degrees of left hip IR and ER to improve ROM in hip  Baseline: see above Target date: 05/12/2022 Goal status: INITIAL   3.  Patient will report ability to perform more LE exercises while in aquatics to improve strength and mobility. Baseline: unable Target date: 05/12/2022 Goal status: MET   PLAN: PT FREQUENCY:  1-2x/week for total of 12 visits over 10 week certiication   PT DURATION: 10 weeks   PLANNED INTERVENTIONS: Therapeutic exercises, Therapeutic activity, Neuromuscular re-education, Balance training, Gait training, Patient/Family education, Self Care, Joint mobilization, Joint manipulation, Orthotic/Fit training, DME instructions, Aquatic Therapy, Dry Needling, Electrical stimulation, Spinal manipulation, Spinal mobilization, Cryotherapy, Moist heat, Ionotophoresis 4mg /ml Dexamethasone, and Manual therapy.   PLAN FOR NEXT SESSION: patient likes some form of manual (gentle STM, tibial bouncing) then exercises, gentle knee ext,ankle and hip mobility L.; left hip STM, mobilizations, long axis traction, hip IR/ER Ext/flexion, isometrics and strengthening   Rudi Heap PT, DPT 04/09/22  2:31 PM

## 2022-04-08 ENCOUNTER — Encounter: Payer: Self-pay | Admitting: Family Medicine

## 2022-04-08 ENCOUNTER — Ambulatory Visit: Payer: Medicare Other | Admitting: Family Medicine

## 2022-04-08 VITALS — BP 138/68 | HR 45 | Temp 97.6°F | Ht 63.0 in | Wt 124.7 lb

## 2022-04-08 DIAGNOSIS — R001 Bradycardia, unspecified: Secondary | ICD-10-CM | POA: Diagnosis not present

## 2022-04-08 DIAGNOSIS — M159 Polyosteoarthritis, unspecified: Secondary | ICD-10-CM

## 2022-04-08 DIAGNOSIS — I10 Essential (primary) hypertension: Secondary | ICD-10-CM | POA: Diagnosis not present

## 2022-04-08 DIAGNOSIS — R9431 Abnormal electrocardiogram [ECG] [EKG]: Secondary | ICD-10-CM

## 2022-04-08 NOTE — Progress Notes (Signed)
Established Patient Office Visit  Subjective   Patient ID: Kaitlyn Parks, female    DOB: 04-09-1935  Age: 87 y.o. MRN: [id number]  Chief Complaint  Patient presents with   Establish Care    Pt is here for transition of care visit. Patient reports she is having more difficulty walking long distances. She states she is a patient of [facility]. States that she gets injections in her SI joints regularly. Had her hip replaced in 2018 by [facility] in 2018. She denies any weakness in her legs, states she has a history of scoliosis as well. She reports no numbness or tingling in her legs, states her weakness is mostly in her spine. States that she does take advil and/or aleve at home as needed.  HTN-- pt reports good control at home of her BP, states that she was only taking hydralazine PRN for elevated blood pressures, then she was started on 2.5 mg norvasc daily by her cardiologist. States she checks it twice a week at home. States it is usually 140 systolic. BP today was on the low side and her HR today is 45. Patient reports that she has been feeling weak/ tired lately. States her HR at home is usually in the 40's. Repeat BP in office today was 138/68.   EKG was performed in office today and showed normal sinus rhythm, no AV blocks, no QT interval problems.  Patient Active Problem List   Diagnosis Date Noted   Aortic atherosclerosis ([facility]) 12/07/2019   Macular degeneration 01/11/2019   Coronary artery disease involving native coronary artery of native heart without angina pectoris 10/29/2018   Presence of intraocular lens 09/23/2018   Bradycardia 11/11/2017   Dyslipidemia 11/11/2017   Pain in joint of right hip 08/04/2017   CAD (coronary artery disease) 06/12/2017   GERD (gastroesophageal reflux disease) 06/12/2017   S/P hip replacement, right 06/12/2017   Osteoarthritis of right hip 06/10/2017   CAD S/P percutaneous coronary angioplasty 02/11/2015   Renal  insufficiency 02/11/2015   Prolonged Q-T interval on ECG 04/18/2010   Hypertension 08/11/2007   Hypothyroidism (acquired) 06/08/2007   MITRAL VALVE PROLAPSE 06/08/2007      Review of Systems  Constitutional:         Positive for fatigue only  Respiratory:  Negative for shortness of breath.   Cardiovascular:  Negative for chest pain, palpitations and leg swelling.  Musculoskeletal:  Positive for back pain.  Neurological:  Negative for dizziness.  All other systems reviewed and are negative.     Objective:     BP 138/68 (BP Location: Left Arm, Patient Position: Sitting, Cuff Size: Normal)   Pulse (!) 45   Temp 97.6 F (36.4 C) (Oral)   Ht 5\' 3"  (1.6 m)   Wt 124 lb 11.2 oz (56.6 kg)   SpO2 98%   BMI 22.09 kg/m  BP Readings from Last 3 Encounters:  04/08/22 138/68  11/13/21 120/60  10/22/21 (!) 142/70      Physical Exam Vitals reviewed.  Constitutional:      Appearance: Normal appearance. She is well-groomed and normal weight.  HENT:     Head: Normocephalic and atraumatic.  Cardiovascular:     Rate and Rhythm: Normal rate and regular rhythm.     Heart sounds: S1 normal and S2 normal.  Pulmonary:     Effort: Pulmonary effort is normal.     Breath sounds: Normal breath sounds and air entry.  Musculoskeletal:     Cervical  back: Normal range of motion and neck supple.     Right lower leg: No edema.     Left lower leg: No edema.  Skin:    General: Skin is warm and dry.  Neurological:     Mental Status: She is alert and oriented to person, place, and time. Mental status is at baseline.     Gait: Gait is intact.  Psychiatric:        Mood and Affect: Mood and affect normal.        Speech: Speech normal.        Behavior: Behavior normal.        Judgment: Judgment normal.      No results found for any visits on 04/08/22.  Last metabolic panel Lab Results  Component Value Date   GLUCOSE 104 (H) 09/30/2021   NA 136 09/30/2021   K 3.4 (L) 09/30/2021   CL 101  09/30/2021   CO2 26 09/30/2021   BUN 13 09/30/2021   CREATININE 0.71 09/30/2021   GFRNONAA >60 09/30/2021   CALCIUM 8.8 (L) 09/30/2021   PROT 6.4 08/13/2021   ALBUMIN 3.8 08/13/2021   BILITOT 0.4 08/13/2021   ALKPHOS 81 08/13/2021   AST 15 08/13/2021   ALT 12 08/13/2021   ANIONGAP 9 09/30/2021   Last thyroid functions Lab Results  Component Value Date   TSH 0.91 08/13/2021   T4TOTAL 8.7 07/01/2010      The ASCVD Risk score (Arnett DK, et al., 2019) failed to calculate for the following reasons:   The 2019 ASCVD risk score is only valid for ages 27 to 37    Assessment & Plan:   Problem List Items Addressed This Visit       Cardiovascular and Mediastinum   Hypertension - Primary    Current hypertension medications:       Sig   amLODipine (NORVASC) 2.5 MG tablet (Taking) Take 1 tablet (2.5 mg total) by mouth daily.   hydrALAZINE (APRESOLINE) 10 MG tablet (Taking) TAKE (1) TABLET TWICE A DAY. MAY TAKE EXTRA DOSE FOR SBP >160 UP TO A TOTAL OF 4 TIMES A DAY.    Patient taking differently: TAKE (1) TABLET TWICE A DAY. MAY TAKE EXTRA DOSE FOR SBP >160 UP TO A TOTAL OF 4 TIMES A DAY. As Needed  BP today in office initially was low, repeat was 138/68, she just recently saw her cardiologist, EKG in office today was performed due to low resting HR, HR improved to 61 on the EKG and morphology was essentially normal on EKG. If she is having symptoms of bradycardia then I recommend she go back to the cardiologist for further recommendations.       Relevant Orders   Lipid Panel     Other   Prolonged Q-T interval on ECG   Relevant Orders   EKG 12-Lead   Bradycardia    Resting HR was in the 40's in the office today, she may be endorsing some symptoms of bradycardia but it is not clear. We reviewed the warning signs of bradycardia and pt states she will monitor her condition at home.       Other Visit Diagnoses     Primary osteoarthritis involving multiple joints       Relevant  Orders   Rheumatoid Factor      Pt requested the rheumatoid factor to rule out inflammatory arthritis. Order placed. Return in about 6 months (around 10/07/2022) for follow up.    Karie Georges, MD

## 2022-04-08 NOTE — Patient Instructions (Signed)
If you continue to feel weak/ tired or if there is any dizziness or lightheadedness I encourage you to make an appointment with your cardiologist to possibly investigate this low heart rate. Your EKG today showed normal sinus rhythm with a HR of 61.

## 2022-04-09 ENCOUNTER — Ambulatory Visit: Payer: Medicare Other | Admitting: Physical Therapy

## 2022-04-09 ENCOUNTER — Other Ambulatory Visit (INDEPENDENT_AMBULATORY_CARE_PROVIDER_SITE_OTHER): Payer: Medicare Other

## 2022-04-09 DIAGNOSIS — M6281 Muscle weakness (generalized): Secondary | ICD-10-CM

## 2022-04-09 DIAGNOSIS — G8929 Other chronic pain: Secondary | ICD-10-CM | POA: Diagnosis not present

## 2022-04-09 DIAGNOSIS — M159 Polyosteoarthritis, unspecified: Secondary | ICD-10-CM

## 2022-04-09 DIAGNOSIS — M533 Sacrococcygeal disorders, not elsewhere classified: Secondary | ICD-10-CM | POA: Diagnosis not present

## 2022-04-09 DIAGNOSIS — I10 Essential (primary) hypertension: Secondary | ICD-10-CM

## 2022-04-09 DIAGNOSIS — M25562 Pain in left knee: Secondary | ICD-10-CM | POA: Diagnosis not present

## 2022-04-09 LAB — LIPID PANEL
Cholesterol: 142 mg/dL (ref 0–200)
HDL: 54.3 mg/dL (ref >39.00)
LDL Cholesterol: 65 mg/dL (ref 0–99)
NonHDL: 88.01
Total CHOL/HDL Ratio: 3
Triglycerides: 117 mg/dL (ref 0.0–149.0)
VLDL: 23.4 mg/dL (ref 0.0–40.0)

## 2022-04-09 NOTE — Assessment & Plan Note (Signed)
Current hypertension medications:      Sig   amLODipine (NORVASC) 2.5 MG tablet (Taking) Take 1 tablet (2.5 mg total) by mouth daily.   hydrALAZINE (APRESOLINE) 10 MG tablet (Taking) TAKE (1) TABLET TWICE A DAY. MAY TAKE EXTRA DOSE FOR SBP >160 UP TO A TOTAL OF 4 TIMES A DAY.    Patient taking differently: TAKE (1) TABLET TWICE A DAY. MAY TAKE EXTRA DOSE FOR SBP >160 UP TO A TOTAL OF 4 TIMES A DAY. As Needed    BP today in office initially was low, repeat was 138/68, she just recently saw her cardiologist, EKG in office today was performed due to low resting HR, HR improved to 61 on the EKG and morphology was essentially normal on EKG. If she is having symptoms of bradycardia then I recommend she go back to the cardiologist for further recommendations.

## 2022-04-09 NOTE — Assessment & Plan Note (Signed)
Resting HR was in the 40's in the office today, she may be endorsing some symptoms of bradycardia but it is not clear. We reviewed the warning signs of bradycardia and pt states she will monitor her condition at home.

## 2022-04-10 LAB — RHEUMATOID FACTOR: Rheumatoid fact SerPl-aCnc: <14 IU/mL (ref <14)

## 2022-04-10 NOTE — Progress Notes (Signed)
Cholesterol is normal, no evidence of rheumatoid disease

## 2022-04-13 ENCOUNTER — Encounter: Payer: Self-pay | Admitting: Physical Therapy

## 2022-04-13 ENCOUNTER — Ambulatory Visit: Payer: Medicare Other | Admitting: Physical Therapy

## 2022-04-13 DIAGNOSIS — M533 Sacrococcygeal disorders, not elsewhere classified: Secondary | ICD-10-CM | POA: Diagnosis not present

## 2022-04-13 DIAGNOSIS — M6281 Muscle weakness (generalized): Secondary | ICD-10-CM | POA: Diagnosis not present

## 2022-04-13 DIAGNOSIS — G8929 Other chronic pain: Secondary | ICD-10-CM

## 2022-04-13 DIAGNOSIS — M25562 Pain in left knee: Secondary | ICD-10-CM | POA: Diagnosis not present

## 2022-04-13 NOTE — Therapy (Signed)
OUTPATIENT PHYSICAL THERAPY TREATMENT NOTE   Patient Name: Kaitlyn Parks MRN: [id number] DOB:11/02/1934, 87 y.o., female Today's Date: 04/13/2022   END OF SESSION:   PT End of Session - 04/13/22 1349     Visit Number 11    Number of Visits 12    Date for PT Re-Evaluation 05/12/22    Authorization Type U[employer]    Progress Note Due on Visit 10    PT Start Time 1345    PT Stop Time 1425    PT Time Calculation (min) 40 min    Activity Tolerance Patient tolerated treatment well    Behavior During Therapy WFL for tasks assessed/performed                 Past Medical History:  Diagnosis Date   Arthritis    DDD, scoliosis, sees Dr. Arnoldo Morale for this, uses norco very rarely for pain   Bradycardia 11/11/2017   CAD (coronary artery disease)    LAD stenting of a 90% lesion 2012   Cystocele    Educated about COVID-19 virus infection 12/06/2019   Elevated cholesterol    GERD (gastroesophageal reflux disease)    dx in work up 2016 for atypical CP at OSH   pt. denies   Heart murmur    Hypertensive retinopathy    OU   Hypothyroidism    Interstitial cystitis    sees Dr. Jeffie Pollock   Macular degeneration    OU   NSTEMI (non-ST elevated myocardial infarction) [facility])    Osteoporosis    Rectocele    S/P hip replacement, right 06/12/2017   Scoliosis    Thyroid disease    Hypothyroid   Urinary incontinence    USI   Uterine prolapse    Past Surgical History:  Procedure Laterality Date   BLADDER SUSPENSION  2011   CATARACT EXTRACTION Bilateral 2015   Dr. Herbert Deaner   CORONARY ANGIOPLASTY WITH STENT PLACEMENT  04/21/2010   LAD 80%, RCA 30%, nl EF, s/p DES LAD   EYE SURGERY     LEFT HEART CATH AND CORONARY ANGIOGRAPHY N/A 06/14/2017   Procedure: LEFT HEART CATH AND CORONARY ANGIOGRAPHY;  Surgeon: Lorretta Harp, MD;  Location: [place] [facility];  Service: Cardiovascular;  Laterality: N/A;   OOPHORECTOMY  2011   BSO   TOTAL HIP ARTHROPLASTY Right 06/10/2017   Procedure: RIGHT TOTAL  HIP ARTHROPLASTY ANTERIOR APPROACH;  Surgeon: Rod Can, MD;  Location: [facility];  Service: Orthopedics;  Laterality: Right;  Needs RNFA   VAGINAL HYSTERECTOMY  2011   LAVH BSO; benign   Patient Active Problem List   Diagnosis Date Noted   Aortic atherosclerosis ([place]) 12/07/2019   Macular degeneration 01/11/2019   Coronary artery disease involving native coronary artery of native heart without angina pectoris 10/29/2018   Presence of intraocular lens 09/23/2018   Bradycardia 11/11/2017   Dyslipidemia 11/11/2017   Pain in joint of right hip 08/04/2017   CAD (coronary artery disease) 06/12/2017   GERD (gastroesophageal reflux disease) 06/12/2017   S/P hip replacement, right 06/12/2017   Osteoarthritis of right hip 06/10/2017   CAD S/P percutaneous coronary angioplasty 02/11/2015   Renal insufficiency 02/11/2015   Prolonged Q-T interval on ECG 04/18/2010   Hypertension 08/11/2007   Hypothyroidism (acquired) 06/08/2007   MITRAL VALVE PROLAPSE 06/08/2007   PCP: [name], MD   REFERRING PROVIDER: Reece Agar   REFERRING DIAG: Sacroiliitis M46.1   Rationale for Evaluation and Treatment Rehabilitation   THERAPY DIAG:  Sacroiliac  joint dysfunction   Muscle weakness (generalized)   Chronic pain of left knee   ONSET DATE: 4 years    SUBJECTIVE:                                                                                                                                                                                            SUBJECTIVE STATEMENT:  04/13/2022: Pt states that she was walking a labyrinthine this weekend and had to stop due to pain. She is not able to recall how far it was, but feels as though she almost finished.   Eval: Patient complains of chronic left SIJ pain that has been managed with quarterly injections. Reports she also has left knee pain that wakes her up at night and this is her biggest complaint. States that she also uses heat and ice and  this helps. She is active in water classes Tues/Thurs in the AM and would liek to go back to yoga.   PERTINENT HISTORY:  Scoliosis, R THA 5 years ago, history of cardiac issues   PAIN:  Are you having pain? Yes: NPRS scale: 3/10 Pain location: left knee pain Pain description: throbbing Aggravating factors: aggressive aquatic routine, long distance walking, grocery store Relieving factors: ice, rest     PRECAUTIONS: None   WEIGHT BEARING RESTRICTIONS No   FALLS:  Has patient fallen in last 6 months? No   LIVING ENVIRONMENT: Lives with: lives alone Lives in: House/apartment     OCCUPATION: not working, likes to carden in her potted plants    PLOF: Independent   PATIENT GOALS to not have left knee pain. To go back to yoga (stopped 7 years ago)     OBJECTIVE:    DIAGNOSTIC FINDINGS:  [facility] recent [facility]     SCREENING FOR RED FLAGS: Bowel or bladder incontinence: No Spinal tumors: No Cauda equina syndrome: No Compression fracture: No Abdominal aneurysm: No   COGNITION:           Overall cognitive status: Within functional limits for tasks assessed                              MUSCLE LENGTH: Hamstrings: Right 30 deg; Left 30 deg     POSTURE: S curve in spine, right hip higher than left, forward head.    PALPATION: Tenderness to palpation along glute meds and lumbar paraspinals              LE Measurements       Lower Extremity Right 03/03/2022 Left 03/03/2022    A/PROM MMT A/PROM  MMT  Hip Flexion 105 4 90 4-  Hip Extension          Hip Abduction          Hip Adduction          Hip Internal rotation 45   5*    Hip External rotation 40   10    Knee Flexion 120 4 120* 4-  Knee Extension 0 4 8 lacking extension 4-  Ankle Dorsiflexion          Ankle Plantarflexion          Ankle Inversion          Ankle Eversion           (Blank rows = not tested)            * pain     LUMBAR SPECIAL TESTS:  Positive ely's test on left    TODAY'S TREATMENT   04/13/22 Supine: 90/90 position - on ball 2 minutes, with HS curls, and LTR, B, bent knee fall outs 2 minutes, supine clams with RTB x20, ball squeezes x20, ball squeeze with LAQ x20, supine marching RTB to fatigue, bridges x20, Quad set with OP into extensasion x10 theragun and STM to HS, lateral gastroc until tissue change noted.   04/09/2022:     Aerobic: Supine: 90/90 position - on ball 2 minutes B, bent knee fall outs 2 minutes, supine clams with RTB x20, ball squeezes x20, ball squeeze with LAQ x20, supine marching RTB to fatigue, bridges x20, Quad set with OP into extension x10 STM to HS, lateral gastroc until tissue change noted.      Manual Therapy: tibial bouncing L and R 5 minutes total - pain reduced, lateral hip mobilization grade II left    Lumbar traction  on ball 5 minutes, STM left knee and surrounding muscles. Percussion for vibration to left and right hip and left leg  Trigger Point Dry Needling:  Modalities:      PATIENT EDUCATION:  Education details: on HEP Person educated: Patient Education method: Consulting civil engineer, Media planner, and Handouts Education comprehension: verbalized understanding   HOME EXERCISE PROGRAM: [id number]   ASSESSMENT:   CLINICAL IMPRESSION:  04/07/2022: Pt presents to PT with continued gait deviations but improvements reported with pain today. Session with focus on manual STM to release restrictions and tension noted with improvements in pain and knee ROM following. She was able to perform all exercises today with no reported pain. Pt continues to demonstrate improved functional ROM and strength in her her L LE. She departs today's session with improvements in gait with increased stance time on L LE. Pt will continue to benefit from skilled PT to address continued deficits.   Eval: Patient complains of left SIJ and left knee pain that started years ago. SIJ pain is managed by injections and this will also help with the knee pain. Patient  limited in left hip ROM in all directions with pain in knee presenting with left hip IR. Educated patient on findings and POC moving forward. Patient would greatly benefit from skilled PT to improve overall function and QOL.      OBJECTIVE IMPAIRMENTS Abnormal gait, decreased activity tolerance, decreased balance, decreased mobility, difficulty walking, decreased ROM, decreased strength, postural dysfunction, and pain.    ACTIVITY LIMITATIONS sleeping and locomotion level   PARTICIPATION LIMITATIONS: shopping, community activity, and yard work   PERSONAL FACTORS Age and 1-2 comorbidities: chronic left SIJ pain, R THA   are also affecting patient's  functional outcome.    REHAB POTENTIAL: Good   CLINICAL DECISION MAKING: Stable/uncomplicated   EVALUATION COMPLEXITY: Low   GOALS: Goals reviewed with patient?  yes   SHORT TERM GOALS:   Patient will be independent in self management strategies to improve quality of life and functional outcomes. Baseline: new program Target date: 04/07/2022 Goal status: MET    2.  Patient will report at least 50% improvement in overall symptoms and/or function to demonstrate improved functional mobility Baseline: 0% Target date: 04/07/2022 Goal status: MET    3.  Patient will be able to report sleeping through the night without waking up secondary to pain in her left knee to improve sleep quality. Baseline: painful Target date: 04/07/2022 Goal status: ONGOING, can sleep through night if pain meds taken.          LONG TERM GOALS:   Patient will report at least 75% improvement in overall symptoms and/or function to demonstrate improved functional mobility Baseline: 0% Target date: 05/12/2022 Goal status: INITIAL   2.  Patient will demonstrate at least 15 degrees of left hip IR and ER to improve ROM in hip  Baseline: see above Target date: 05/12/2022 Goal status: INITIAL   3.  Patient will report ability to perform more LE exercises while in  aquatics to improve strength and mobility. Baseline: unable Target date: 05/12/2022 Goal status: MET   PLAN: PT FREQUENCY: 1-2x/week for total of 12 visits over 10 week certiication   PT DURATION: 10 weeks   PLANNED INTERVENTIONS: Therapeutic exercises, Therapeutic activity, Neuromuscular re-education, Balance training, Gait training, Patient/Family education, Self Care, Joint mobilization, Joint manipulation, Orthotic/Fit training, DME instructions, Aquatic Therapy, Dry Needling, Electrical stimulation, Spinal manipulation, Spinal mobilization, Cryotherapy, Moist heat, Ionotophoresis 53m/ml Dexamethasone, and Manual therapy.   PLAN FOR NEXT SESSION: patient likes some form of manual (gentle STM, tibial bouncing) then exercises, gentle knee ext,ankle and hip mobility L.; left hip STM, mobilizations, long axis traction, hip IR/ER Ext/flexion, isometrics and strengthening   SRudi HeapPT, DPT 04/13/22  1:50 PM

## 2022-04-14 NOTE — Therapy (Addendum)
OUTPATIENT PHYSICAL THERAPY TREATMENT NOTE, RECERT and PN Progress Note Reporting Period 03/03/22 to 04/16/22  See note below for Objective Data and Assessment of Progress/Goals.       Patient Name: Kaitlyn Parks MRN: [id number] DOB:November 25, 1934, 87 y.o., female Today's Date: 04/16/2022   END OF SESSION:   PT End of Session - 04/16/22 1400     Visit Number 12    Number of Visits 24    Date for PT Re-Evaluation 06/28/22    Authorization Type UHC    Progress Note Due on Visit 10    PT Start Time 9924    PT Stop Time 1443    PT Time Calculation (min) 45 min    Activity Tolerance Patient tolerated treatment well    Behavior During Therapy [facility] for tasks assessed/performed                  Past Medical History:  Diagnosis Date   Arthritis    DDD, scoliosis, sees Dr. Arnoldo Morale for this, uses norco very rarely for pain   Bradycardia 11/11/2017   CAD (coronary artery disease)    LAD stenting of a 90% lesion 2012   Cystocele    Educated about COVID-19 virus infection 12/06/2019   Elevated cholesterol    GERD (gastroesophageal reflux disease)    dx in work up 2016 for atypical CP at OSH   pt. denies   Heart murmur    Hypertensive retinopathy    OU   Hypothyroidism    Interstitial cystitis    sees Dr. Jeffie Pollock   Macular degeneration    OU   NSTEMI (non-ST elevated myocardial infarction) [facility])    Osteoporosis    Rectocele    S/P hip replacement, right 06/12/2017   Scoliosis    Thyroid disease    Hypothyroid   Urinary incontinence    USI   Uterine prolapse    Past Surgical History:  Procedure Laterality Date   BLADDER SUSPENSION  2011   CATARACT EXTRACTION Bilateral 2015   Dr. Herbert Deaner   CORONARY ANGIOPLASTY WITH STENT PLACEMENT  04/21/2010   LAD 80%, RCA 30%, nl EF, s/p DES LAD   EYE SURGERY     LEFT HEART CATH AND CORONARY ANGIOGRAPHY N/A 06/14/2017   Procedure: LEFT HEART CATH AND CORONARY ANGIOGRAPHY;  Surgeon: Lorretta Harp, MD;  Location: [place] [facility];  Service: Cardiovascular;  Laterality: N/A;   OOPHORECTOMY  2011   BSO   TOTAL HIP ARTHROPLASTY Right 06/10/2017   Procedure: RIGHT TOTAL HIP ARTHROPLASTY ANTERIOR APPROACH;  Surgeon: Rod Can, MD;  Location: [facility];  Service: Orthopedics;  Laterality: Right;  Needs RNFA   VAGINAL HYSTERECTOMY  2011   LAVH BSO; benign   Patient Active Problem List   Diagnosis Date Noted   Aortic atherosclerosis ([place]) 12/07/2019   Macular degeneration 01/11/2019   Coronary artery disease involving native coronary artery of native heart without angina pectoris 10/29/2018   Presence of intraocular lens 09/23/2018   Bradycardia 11/11/2017   Dyslipidemia 11/11/2017   Pain in joint of right hip 08/04/2017   CAD (coronary artery disease) 06/12/2017   GERD (gastroesophageal reflux disease) 06/12/2017   S/P hip replacement, right 06/12/2017   Osteoarthritis of right hip 06/10/2017   CAD S/P percutaneous coronary angioplasty 02/11/2015   Renal insufficiency 02/11/2015   Prolonged Q-T interval on ECG 04/18/2010   Hypertension 08/11/2007   Hypothyroidism (acquired) 06/08/2007   MITRAL VALVE PROLAPSE 06/08/2007   PCP: [name], MD  REFERRING PROVIDER: Reece Agar   REFERRING DIAG: Sacroiliitis M46.1   Rationale for Evaluation and Treatment Rehabilitation   THERAPY DIAG:  Sacroiliac joint dysfunction   Muscle weakness (generalized)   Chronic pain of left knee   ONSET DATE: 4 years    SUBJECTIVE:                                                                                                                                                                                            SUBJECTIVE STATEMENT:  04/13/2022: Pt states that she is having a bit of pain in her L SI joint today due to water aerobics today.   Eval: Patient complains of chronic left SIJ pain that has been managed with quarterly injections. Reports she also has left knee pain that wakes her up at night and  this is her biggest complaint. States that she also uses heat and ice and this helps. She is active in water classes Tues/Thurs in the AM and would liek to go back to yoga.   PERTINENT HISTORY:  Scoliosis, R THA 5 years ago, history of cardiac issues   PAIN:  Are you having pain? Yes: NPRS scale: 6/10 Pain location: left knee pain Pain description: throbbing Aggravating factors: aggressive aquatic routine, long distance walking, grocery store Relieving factors: ice, rest     PRECAUTIONS: None   WEIGHT BEARING RESTRICTIONS No   FALLS:  Has patient fallen in last 6 months? No   LIVING ENVIRONMENT: Lives with: lives alone Lives in: House/apartment     OCCUPATION: not working, likes to carden in her potted plants    PLOF: Independent   PATIENT GOALS to not have left knee pain. To go back to yoga (stopped 7 years ago)     OBJECTIVE:    DIAGNOSTIC FINDINGS:  None recent at [facility]     SCREENING FOR RED FLAGS: Bowel or bladder incontinence: No Spinal tumors: No Cauda equina syndrome: No Compression fracture: No Abdominal aneurysm: No   COGNITION:           Overall cognitive status: Within functional limits for tasks assessed                              MUSCLE LENGTH: Hamstrings: Right 30 deg; Left 30 deg     POSTURE: S curve in spine, right hip higher than left, forward head.    PALPATION: Tenderness to palpation along glute meds and lumbar paraspinals              LE Measurements  Lower Extremity Right 03/03/2022 Left 03/03/2022 R/L 09/14     A/PROM MMT A/PROM MMT A/PROM MMT  Hip Flexion 105 4 90 4- /110 4/4  Hip Extension            Hip Abduction            Hip Adduction            Hip Internal rotation 45   5*   30/ 10*   Hip External rotation 40   10   32/ 35   Knee Flexion 120 4 120* 4- 120/ 120 4/4  Knee Extension 0 4 8 lacking extension 4- 0/ Lacking 3 4/4  Ankle Dorsiflexion            Ankle Plantarflexion            Ankle Inversion             Ankle Eversion             (Blank rows = not tested)            * pain     LUMBAR SPECIAL TESTS:  Positive ely's test on left    TODAY'S TREATMENT  04/16/2022 [facility] Adult PT Treatment:                                                DATE: 04/16/2022 Therapeutic Exercise: Supine: 90/90 position - on ball 2 minutes, with HS curls, PPTx 20, bridges x10, clams 2x10 GTB Manual Therapy: Theragun and STM to HS, lateral gastroc until tissue change noted.  04/16/22 Supine: 90/90 position - on ball 2 minutes, with HS curls, and LTR, B, bent knee fall outs 2 minutes, supine clams with RTB x20, ball squeezes x20, ball squeeze with LAQ x20, supine marching RTB to fatigue, bridges x20, Quad set with OP into extensasion x10 theragun and STM to HS, lateral gastroc until tissue change noted.   04/09/2022:     Aerobic: Supine: 90/90 position - on ball 2 minutes B, bent knee fall outs 2 minutes, supine clams with RTB x20, ball squeezes x20, ball squeeze with LAQ x20, supine marching RTB to fatigue, bridges x20, Quad set with OP into extension x10 STM to HS, lateral gastroc until tissue change noted.      Manual Therapy: tibial bouncing L and R 5 minutes total - pain reduced, lateral hip mobilization grade II left    Lumbar traction  on ball 5 minutes, STM left knee and surrounding muscles. Percussion for vibration to left and right hip and left leg      PATIENT EDUCATION:  Education details: on HEP Person educated: Patient Education method: Explanation, Demonstration, and Handouts Education comprehension: verbalized understanding   HOME EXERCISE PROGRAM: [id number]   ASSESSMENT:   CLINICAL IMPRESSION: 04/16/2022 Kaitlyn Parks has progressed well with therapy. She reports 40% improvements since starting PT. She states that she is now able to tolerate standing and walking for increased distances. She also notes improvements in water aerobics, and functional ROM. Pt is now able to sleep through  the night on days that she is not perfoming water aerobics or grocery shopping. Improved impairments include: ROM and strength in bilat LE's.  Functional improvements include: Standing, walking and water aerobics.  Progressions needed include: SIJ pain modulation, balance, strength and ROM.  Barriers to progress include: Secondary  scoliosis, and chronic SIJ pain.  Please see GOALS section for progress on short term and long term goals established at evaluation.  I recommend D/C home with HEP; pt agrees with plan.  Eval: Patient complains of left SIJ and left knee pain that started years ago. SIJ pain is managed by injections and this will also help with the knee pain. Patient limited in left hip ROM in all directions with pain in knee presenting with left hip IR. Educated patient on findings and POC moving forward. Patient would greatly benefit from skilled PT to improve overall function and QOL.      OBJECTIVE IMPAIRMENTS Abnormal gait, decreased activity tolerance, decreased balance, decreased mobility, difficulty walking, decreased ROM, decreased strength, postural dysfunction, and pain.    ACTIVITY LIMITATIONS sleeping and locomotion level   PARTICIPATION LIMITATIONS: shopping, community activity, and yard work   PERSONAL FACTORS Age and 1-2 comorbidities: chronic left SIJ pain, R THA   are also affecting patient's functional outcome.    REHAB POTENTIAL: Good   CLINICAL DECISION MAKING: Stable/uncomplicated   EVALUATION COMPLEXITY: Low   GOALS: Goals reviewed with patient?  yes   SHORT TERM GOALS:   Patient will be independent in self management strategies to improve quality of life and functional outcomes. Baseline: new program Target date: 04/07/2022 Goal status: MET    2.  Patient will report at least 50% improvement in overall symptoms and/or function to demonstrate improved functional mobility Baseline: 0% Target date: 04/07/2022 Goal status: MET    3.  Patient will be able to  report sleeping through the night without waking up secondary to pain in her left knee to improve sleep quality. Baseline: painful Target date: 05/07/2022 Goal status: ONGOING, Pt intermittently has been taking pain meds to sleep through the night. She says she only takes pain meds when she does water aerobics.      LONG TERM GOALS:   Patient will report at least 75% improvement in overall symptoms and/or function to demonstrate improved functional mobility Baseline: 0% Target date: 05/28/2022 Goal status: 40% improvements per pt.    2.  Patient will demonstrate at least 15 degrees of left hip IR and ER to improve ROM in hip  Baseline: see above Target date: 05/28/2022 Goal status: 10 degrees internal rotation on L LE.    3.  Patient will report ability to perform more LE exercises while in aquatics to improve strength and mobility. Baseline: unable Target date: 05/12/2022 Goal status: MET   PLAN: PT FREQUENCY: 2x/week    PT DURATION: 6 weeks   PLANNED INTERVENTIONS: Therapeutic exercises, Therapeutic activity, Neuromuscular re-education, Balance training, Gait training, Patient/Family education, Self Care, Joint mobilization, Joint manipulation, Orthotic/Fit training, DME instructions, Aquatic Therapy, Dry Needling, Electrical stimulation, Spinal manipulation, Spinal mobilization, Cryotherapy, Moist heat, Ionotophoresis 73m/ml Dexamethasone, and Manual therapy.   PLAN FOR NEXT SESSION: patient likes some form of manual (gentle STM, tibial bouncing) then exercises, gentle knee ext,ankle and hip mobility L.; left hip STM, mobilizations, long axis traction, hip IR/ER Ext/flexion, isometrics and strengthening   SRudi HeapPT, DPT 04/16/22  3:15 PM

## 2022-04-16 ENCOUNTER — Encounter: Payer: Self-pay | Admitting: Physical Therapy

## 2022-04-16 ENCOUNTER — Ambulatory Visit: Payer: Medicare Other | Admitting: Physical Therapy

## 2022-04-16 DIAGNOSIS — M25562 Pain in left knee: Secondary | ICD-10-CM

## 2022-04-16 DIAGNOSIS — G8929 Other chronic pain: Secondary | ICD-10-CM

## 2022-04-16 DIAGNOSIS — M533 Sacrococcygeal disorders, not elsewhere classified: Secondary | ICD-10-CM | POA: Diagnosis not present

## 2022-04-16 DIAGNOSIS — M6281 Muscle weakness (generalized): Secondary | ICD-10-CM

## 2022-04-21 ENCOUNTER — Encounter: Payer: Medicare Other | Admitting: Physical Therapy

## 2022-04-21 ENCOUNTER — Other Ambulatory Visit: Payer: Self-pay | Admitting: *Deleted

## 2022-04-21 DIAGNOSIS — I10 Essential (primary) hypertension: Secondary | ICD-10-CM

## 2022-04-21 MED ORDER — ISOSORBIDE MONONITRATE ER 60 MG PO TB24: ORAL_TABLET | ORAL | 1 refills | Status: DC

## 2022-04-22 ENCOUNTER — Other Ambulatory Visit: Payer: Self-pay | Admitting: *Deleted

## 2022-04-22 DIAGNOSIS — E039 Hypothyroidism, unspecified: Secondary | ICD-10-CM

## 2022-04-22 MED ORDER — SYNTHROID 50 MCG PO TABS: ORAL_TABLET | ORAL | 1 refills | Status: DC

## 2022-04-23 ENCOUNTER — Emergency Department (HOSPITAL_COMMUNITY): Payer: Medicare Other

## 2022-04-23 ENCOUNTER — Other Ambulatory Visit: Payer: Self-pay

## 2022-04-23 ENCOUNTER — Emergency Department (HOSPITAL_COMMUNITY)
Admission: EM | Admit: 2022-04-23 | Discharge: 2022-04-23 | Disposition: A | Discharge status: Home / Self Care | Payer: Medicare Other | Attending: Emergency Medicine | Admitting: Emergency Medicine

## 2022-04-23 ENCOUNTER — Encounter (HOSPITAL_COMMUNITY): Payer: Self-pay | Admitting: Emergency Medicine

## 2022-04-23 DIAGNOSIS — Z79899 Other long term (current) drug therapy: Secondary | ICD-10-CM | POA: Insufficient documentation

## 2022-04-23 DIAGNOSIS — I2510 Atherosclerotic heart disease of native coronary artery without angina pectoris: Secondary | ICD-10-CM | POA: Diagnosis not present

## 2022-04-23 DIAGNOSIS — I10 Essential (primary) hypertension: Secondary | ICD-10-CM | POA: Diagnosis not present

## 2022-04-23 DIAGNOSIS — Z7982 Long term (current) use of aspirin: Secondary | ICD-10-CM | POA: Insufficient documentation

## 2022-04-23 DIAGNOSIS — R0789 Other chest pain: Secondary | ICD-10-CM | POA: Diagnosis present

## 2022-04-23 DIAGNOSIS — M549 Dorsalgia, unspecified: Secondary | ICD-10-CM | POA: Insufficient documentation

## 2022-04-23 DIAGNOSIS — R1013 Epigastric pain: Secondary | ICD-10-CM | POA: Diagnosis not present

## 2022-04-23 LAB — CBC WITH DIFFERENTIAL/PLATELET
Abs Immature Granulocytes: 0.01 10*3/uL (ref 0.00–0.07)
Basophils Absolute: 0 10*3/uL (ref 0.0–0.1)
Basophils Relative: 1 %
Eosinophils Absolute: 0.2 10*3/uL (ref 0.0–0.5)
Eosinophils Relative: 3 %
HCT: 39.7 % (ref 36.0–46.0)
Hemoglobin: 12.7 g/dL (ref 12.0–15.0)
Immature Granulocytes: 0 %
Lymphocytes Relative: 27 %
Lymphs Abs: 1.3 10*3/uL (ref 0.7–4.0)
MCH: 31.1 pg (ref 26.0–34.0)
MCHC: 32 g/dL (ref 30.0–36.0)
MCV: 97.1 fL (ref 80.0–100.0)
Monocytes Absolute: 0.5 10*3/uL (ref 0.1–1.0)
Monocytes Relative: 10 %
Neutro Abs: 2.9 10*3/uL (ref 1.7–7.7)
Neutrophils Relative %: 59 %
Platelets: 237 10*3/uL (ref 150–400)
RBC: 4.09 MIL/uL (ref 3.87–5.11)
RDW: 12.1 % (ref 11.5–15.5)
WBC: 4.9 10*3/uL (ref 4.0–10.5)
nRBC: 0 % (ref 0.0–0.2)

## 2022-04-23 LAB — I-STAT CHEM 8, ED
BUN: 27 mg/dL — ABNORMAL HIGH (ref 8–23)
Calcium, Ion: 1.06 mmol/L — ABNORMAL LOW (ref 1.15–1.40)
Chloride: 108 mmol/L (ref 98–111)
Creatinine, Ser: 0.7 mg/dL (ref 0.44–1.00)
Glucose, Bld: 111 mg/dL — ABNORMAL HIGH (ref 70–99)
HCT: 37 % (ref 36.0–46.0)
Hemoglobin: 12.6 g/dL (ref 12.0–15.0)
Potassium: 4.1 mmol/L (ref 3.5–5.1)
Sodium: 140 mmol/L (ref 135–145)
TCO2: 25 mmol/L (ref 22–32)

## 2022-04-23 LAB — COMPREHENSIVE METABOLIC PANEL
ALT: 12 U/L (ref 0–44)
AST: 20 U/L (ref 15–41)
Albumin: 3.5 g/dL (ref 3.5–5.0)
Alkaline Phosphatase: 58 U/L (ref 38–126)
Anion gap: 9 (ref 5–15)
BUN: 23 mg/dL (ref 8–23)
CO2: 25 mmol/L (ref 22–32)
Calcium: 9.4 mg/dL (ref 8.9–10.3)
Chloride: 107 mmol/L (ref 98–111)
Creatinine, Ser: 0.71 mg/dL (ref 0.44–1.00)
GFR, Estimated: >60 mL/min (ref >60)
Glucose, Bld: 116 mg/dL — ABNORMAL HIGH (ref 70–99)
Potassium: 4 mmol/L (ref 3.5–5.1)
Sodium: 141 mmol/L (ref 135–145)
Total Bilirubin: 0.6 mg/dL (ref 0.3–1.2)
Total Protein: 5.9 g/dL — ABNORMAL LOW (ref 6.5–8.1)

## 2022-04-23 LAB — TROPONIN I (HIGH SENSITIVITY)
Troponin I (High Sensitivity): 7 ng/L (ref <18)
Troponin I (High Sensitivity): 6 ng/L (ref <18)

## 2022-04-23 LAB — LIPASE, BLOOD: Lipase: 75 U/L — ABNORMAL HIGH (ref 11–51)

## 2022-04-23 MED ORDER — AMLODIPINE BESYLATE 5 MG PO TABS
5.0000 mg | ORAL_TABLET | Freq: Once | ORAL | Status: AC
  Administered 2022-04-23: 5 mg via ORAL
  Filled 2022-04-23: qty 1

## 2022-04-23 MED ORDER — LIDOCAINE VISCOUS HCL 2 % MT SOLN
15.0000 mL | Freq: Once | OROMUCOSAL | Status: AC
  Administered 2022-04-23: 15 mL via ORAL
  Filled 2022-04-23: qty 15

## 2022-04-23 MED ORDER — METHOCARBAMOL 500 MG PO TABS
500.0000 mg | ORAL_TABLET | Freq: Once | ORAL | Status: AC
  Administered 2022-04-23: 500 mg via ORAL
  Filled 2022-04-23: qty 1

## 2022-04-23 MED ORDER — PANTOPRAZOLE SODIUM 20 MG PO TBEC: 20.0000 mg | DELAYED_RELEASE_TABLET | Freq: Every day | ORAL | 0 refills | Status: DC

## 2022-04-23 MED ORDER — FENTANYL CITRATE PF 50 MCG/ML IJ SOSY
50.0000 ug | PREFILLED_SYRINGE | Freq: Once | INTRAMUSCULAR | Status: AC
  Administered 2022-04-23: 50 ug via INTRAVENOUS
  Filled 2022-04-23: qty 1

## 2022-04-23 MED ORDER — ALUM & MAG HYDROXIDE-SIMETH 200-200-20 MG/5ML PO SUSP
30.0000 mL | Freq: Once | ORAL | Status: AC
  Administered 2022-04-23: 30 mL via ORAL
  Filled 2022-04-23: qty 30

## 2022-04-23 MED ORDER — HYDRALAZINE HCL 20 MG/ML IJ SOLN
5.0000 mg | Freq: Once | INTRAMUSCULAR | Status: AC
  Administered 2022-04-23: 5 mg via INTRAVENOUS
  Filled 2022-04-23: qty 1

## 2022-04-23 MED ORDER — IOHEXOL 350 MG/ML SOLN
100.0000 mL | Freq: Once | INTRAVENOUS | Status: AC | PRN
  Administered 2022-04-23: 100 mL via INTRAVENOUS

## 2022-04-23 MED ORDER — ALUM & MAG HYDROXIDE-SIMETH 400-400-40 MG/5ML PO SUSP: 15.0000 mL | Freq: Four times a day (QID) | ORAL | 0 refills | Status: DC | PRN

## 2022-04-23 MED ORDER — HYDROMORPHONE HCL 1 MG/ML IJ SOLN
0.5000 mg | Freq: Once | INTRAMUSCULAR | Status: AC
  Administered 2022-04-23: 0.5 mg via INTRAVENOUS
  Filled 2022-04-23: qty 1

## 2022-04-23 NOTE — ED Triage Notes (Signed)
Patient arrived with EMS from home reports low chest pain/epigastric pain this evening with SOB , no emesis or diaphoresis , pain radiating to upper back . She received Fentanyl 100 mcg IV ; ASA 324 mg and a total of 8 NTG sl prior to arrival with mild relief. History of CAD/Stent.

## 2022-04-23 NOTE — ED Provider Triage Note (Signed)
Emergency Medicine Provider Triage Evaluation Note  Kaitlyn Parks , a 87 y.o. female  was evaluated in triage.  Pt complains of chest pain. Pain more to lower chest/epigastrium into the diffuse back, very severe, woke her from sleep. Given fentanyl w/ temporary mild relief, also has had aspirin & NTG. Associated nausea. No hx of similar pain. Some paresthesias into the left arm. Denies vomiting, syncope, pleuritic pain, or dyspnea.  Review of Systems  Per above.   Physical Exam  BP (!) 194/54   Pulse (!) 56   Temp 97.8 F (36.6 C) (Oral)   Resp 16   SpO2 100%  Gen:   Awake, appears very uncomfortable.  Resp:  Normal effort  MSK:   Moves extremities without difficulty  Other:  2+ symmetric radial & DP pulses. Epigastric TTP. No focal tenderness throughout the back.   Medical Decision Making  Medically screening exam initiated at 12:50 AM.  Appropriate orders placed.  Kaitlyn Parks was informed that the remainder of the evaluation will be completed by another provider, this initial triage assessment does not replace that evaluation, and the importance of remaining in the ED until their evaluation is complete.  Chest pain.    Amaryllis Dyke, PA-C 04/24/22 0004

## 2022-04-23 NOTE — ED Notes (Signed)
Pt has ambulated to bathroom with standby assist twice

## 2022-04-23 NOTE — ED Provider Notes (Signed)
[facility] Provider Note   CSN: [id number] Arrival date & time: 04/23/22  0042     History  Chief Complaint  Patient presents with   Chest Pain    Kaitlyn Parks is a 87 y.o. female.  87 year old female with multiple past medical problems that presents the ER today with multiple symptoms.  States that she woke up this morning with severe pain in her whole back.  All the way from her hips to her shoulders.  She states that she also has some epigastric pain associate with the dyspnea and nausea.  States that when she has had heart attacks in the past has been chest pressure and discomfort.  Has a history of high blood pressure and takes a low-dose Norvasc and then as needed hydralazine.  Also has severe scoliosis.  Had a Myoview in March of this year that did not show any inducible ischemia.  Had a catheterization 5 years ago that showed some coronary artery disease but nothing that needed stents.  Did have a stent back in 2008.  No significant issues yesterday as far as heavy lifting or increased work.   Chest Pain      Home Medications Prior to Admission medications   Medication Sig Start Date End Date Taking? Authorizing Provider  alum & mag hydroxide-simeth (MAALOX PLUS) 400-400-40 MG/5ML suspension Take 15 mLs by mouth every 6 (six) hours as needed for indigestion. 04/23/22  Yes Nashaun Hillmer, Barbara CowerJason, MD  pantoprazole (PROTONIX) 20 MG tablet Take 1 tablet (20 mg total) by mouth daily for 10 days. 04/23/22 05/03/22 Yes Cecile Gillispie, Barbara CowerJason, MD  amLODipine (NORVASC) 2.5 MG tablet Take 1 tablet (2.5 mg total) by mouth daily. 02/12/22   Rollene RotundaHochrein, James, MD  aspirin EC 81 MG tablet Take 81 mg by mouth daily.    [provider]  Biotin w/ Vitamins C & E (HAIR/SKIN/NAILS PO) Take 1 tablet by mouth daily.    [provider]  Cranberry 400 MG CAPS Take 400 mg by mouth daily.    [provider]  hydrALAZINE (APRESOLINE) 10 MG tablet TAKE (1)  TABLET TWICE A DAY. MAY TAKE EXTRA DOSE FOR SBP >160 UP TO A TOTAL OF 4 TIMES A DAY. Patient taking differently: TAKE (1) TABLET TWICE A DAY. MAY TAKE EXTRA DOSE FOR SBP >160 UP TO A TOTAL OF 4 TIMES A DAY. As Needed 10/17/21   Rollene RotundaHochrein, James, MD  isosorbide mononitrate (IMDUR) 60 MG 24 hr tablet TAKE 1 & 1/2 TABLETS A DAY. 04/21/22   Karie GeorgesMichael, Barbara M, MD  Multiple Vitamins-Calcium (ONE-A-DAY WOMENS PO) Take 1 tablet by mouth daily.     [provider]  nitrofurantoin, macrocrystal-monohydrate, (MACROBID) 100 MG capsule Take by mouth. Takes as Needed 12/06/20   [provider]  nitroGLYCERIN (NITROSTAT) 0.4 MG SL tablet DISSOLVE 1 TABLET UNDER TONGUE IF NEEDED FOR CHEST PAIN. MAY REPEAT IN 5 MINUTES FOR 3 DOSES. 10/06/21   Rollene RotundaHochrein, James, MD  PRESCRIPTION MEDICATION Avastin eye injections given by Dr Vanessa BarbaraZamora due to macular degeneration    [provider]  rosuvastatin (CRESTOR) 10 MG tablet TABLET ONE-HALF TABLET BY MOUTH DAILY 10/17/21   Rollene RotundaHochrein, James, MD  SYNTHROID 50 MCG tablet TAKE 1 TABLET EACH DAY. 04/22/22   Karie GeorgesMichael, Barbara M, MD      Allergies    Acyclovir and related, Pravachol [pravastatin sodium], Sulfa antibiotics, and Zocor [simvastatin]    Review of Systems   Review of Systems  Cardiovascular:  Positive for  chest pain.    Physical Exam Updated Vital Signs BP (!) 145/76 (BP Location: Right Arm)   Pulse 70   Temp 97.8 F (36.6 C) (Oral)   Resp 18   SpO2 97%  Physical Exam Vitals and nursing note reviewed.  Constitutional:      Appearance: She is well-developed.  HENT:     Head: Normocephalic and atraumatic.  Cardiovascular:     Rate and Rhythm: Normal rate and regular rhythm.     Comments: Pulses intact Pulmonary:     Effort: Pulmonary effort is normal. No respiratory distress.     Breath sounds: No stridor.  Abdominal:     General: There is no distension.  Musculoskeletal:        General: Normal range of motion.     Cervical back:  Normal range of motion.     Right lower leg: No edema.     Left lower leg: No edema.  Skin:    General: Skin is warm and dry.  Neurological:     Mental Status: She is alert.     ED Results / Procedures / Treatments   Labs (all labs ordered are listed, but only abnormal results are displayed) Labs Reviewed  COMPREHENSIVE METABOLIC PANEL - Abnormal; Notable for the following components:      Result Value   Glucose, Bld 116 (*)    Total Protein 5.9 (*)    All other components within normal limits  LIPASE, BLOOD - Abnormal; Notable for the following components:   Lipase 75 (*)    All other components within normal limits  I-STAT CHEM 8, ED - Abnormal; Notable for the following components:   BUN 27 (*)    Glucose, Bld 111 (*)    Calcium, Ion 1.06 (*)    All other components within normal limits  CBC WITH DIFFERENTIAL/PLATELET  TROPONIN I (HIGH SENSITIVITY)  TROPONIN I (HIGH SENSITIVITY)    EKG EKG Interpretation  Date/Time:  Thursday April 23 2022 00:51:46 EDT Ventricular Rate:  54 PR Interval:  194 QRS Duration: 86 QT Interval:  456 QTC Calculation: 432 R Axis:   76 Text Interpretation: Sinus bradycardia Nonspecific ST abnormality Abnormal ECG When compared with ECG of 27-Oct-2021 10:07, PREVIOUS ECG IS PRESENT Confirmed by Merrily Pew [phone]) on 04/23/2022 2:54:45 AM  Radiology US Abdomen Limited RUQ (LIVER/GB)  Result Date: 04/23/2022 CLINICAL DATA:  Right upper quadrant pain EXAM: ULTRASOUND ABDOMEN LIMITED RIGHT UPPER QUADRANT COMPARISON:  Chest CT from earlier today FINDINGS: Gallbladder: No gallstones or wall thickening visualized. No sonographic Murphy sign noted by sonographer. Common bile duct: Diameter: 4 mm Liver: No focal lesion identified. Within normal limits in parenchymal echogenicity. Portal vein is patent on color Doppler imaging with normal direction of blood flow towards the liver. IMPRESSION: Normal exam Electronically Signed   By: Jorje Guild  M.D.   On: 04/23/2022 04:37   CT Angio Chest/Abd/Pel for Dissection W and/or W/WO  Result Date: 04/23/2022 CLINICAL DATA:  Chest and back pain with shortness of breath EXAM: CT ANGIOGRAPHY CHEST, ABDOMEN AND PELVIS TECHNIQUE: Non-contrast CT of the chest was initially obtained. Multidetector CT imaging through the chest, abdomen and pelvis was performed using the standard protocol during bolus administration of intravenous contrast. Multiplanar reconstructed images and MIPs were obtained and reviewed to evaluate the vascular anatomy. RADIATION DOSE REDUCTION: This exam was performed according to the departmental dose-optimization program which includes automated exposure control, adjustment of the mA and/or kV according to patient size and/or use  of iterative reconstruction technique. CONTRAST:  OMNIPAQUE IOHEXOL 350 MG/ML SOLN COMPARISON:  Chest x-ray from earlier in the same day, 12/09/2017. FINDINGS: CTA CHEST FINDINGS Cardiovascular: Initial precontrast images show atherosclerotic calcifications. No aneurysmal dilatation is seen. No hyperdense crescent to suggest acute aortic injury is seen. Post-contrast images demonstrate no evidence of dissection. No cardiac enlargement is seen. The pulmonary artery shows a normal branching pattern bilaterally. No focal filling defect to suggest pulmonary embolism is noted. Mediastinum/Nodes: Thoracic inlet is within normal limits. No hilar or mediastinal adenopathy is noted. The esophagus is within normal limits. Lungs/Pleura: Lungs are well aerated bilaterally. Mild emphysematous changes are seen. No focal infiltrate or sizable effusion is noted. No sizable parenchymal nodule is noted. Musculoskeletal: No acute rib abnormality is noted. Degenerative changes of the thoracic spine are seen. Review of the MIP images confirms the above findings. CTA ABDOMEN AND PELVIS FINDINGS VASCULAR Aorta: Abdominal aorta demonstrates atherosclerotic calcification without  aneurysmal dilatation or dissection. Celiac: Patent without evidence of aneurysm, dissection, vasculitis or significant stenosis. SMA: Patent without evidence of aneurysm, dissection, vasculitis or significant stenosis. Renals: Both renal arteries are patent without evidence of aneurysm, dissection, vasculitis, fibromuscular dysplasia or significant stenosis. IMA: Patent without evidence of aneurysm, dissection, vasculitis or significant stenosis. Inflow: Iliacs demonstrate no aneurysmal dilatation or dissection. Veins: No specific venous abnormality is noted. Review of the MIP images confirms the above findings. NON-VASCULAR Hepatobiliary: No focal liver abnormality is seen. No gallstones, gallbladder wall thickening, or biliary dilatation. Pancreas: Unremarkable. No pancreatic ductal dilatation or surrounding inflammatory changes. Spleen: Normal in size without focal abnormality. Adrenals/Urinary Tract: Adrenal glands are within normal limits. Kidneys are well visualized bilaterally within normal enhancement pattern. No renal calculi or obstructive changes are seen. The bladder is well distended. Stomach/Bowel: The appendix is within normal limits. No obstructive or inflammatory changes of the colon are seen. Stomach and small bowel are unremarkable. Lymphatic: No lymphadenopathy is noted. Reproductive: Status post hysterectomy. No adnexal masses. Other: No abdominal wall hernia or abnormality. No abdominopelvic ascites. Musculoskeletal: Right hip replacement is noted. No acute bony abnormality is seen. Review of the MIP images confirms the above findings. IMPRESSION: CTA of the chest: No evidence of aortic dissection or pulmonary embolism. No acute abnormality noted. CTA of the abdomen and pelvis: No aneurysmal dilatation or dissection is seen. No acute abnormality noted. Electronically Signed   By: Alcide Clever M.D.   On: 04/23/2022 02:00   DG Chest Portable 1 View  Result Date: 04/23/2022 CLINICAL DATA:   Chest pain. EXAM: PORTABLE CHEST 1 VIEW COMPARISON:  September 30, 2021 FINDINGS: The heart size and mediastinal contours are within normal limits. There is mild calcification of the aortic arch. Mild atelectasis is seen within the bilateral lung bases. There is no evidence of a pleural effusion or pneumothorax. Multilevel degenerative changes seen throughout the thoracic spine. IMPRESSION: Mild bibasilar atelectasis. Electronically Signed   By: Aram Candela M.D.   On: 04/23/2022 01:35    Procedures Procedures   Medications Ordered in ED Medications  HYDROmorphone (DILAUDID) injection 0.5 mg (0.5 mg Intravenous Given 04/23/22 0105)  iohexol (OMNIPAQUE) 350 MG/ML injection 100 mL (100 mLs Intravenous Contrast Given 04/23/22 0151)  hydrALAZINE (APRESOLINE) injection 5 mg (5 mg Intravenous Given 04/23/22 0247)  fentaNYL (SUBLIMAZE) injection 50 mcg (50 mcg Intravenous Given 04/23/22 0248)  alum & mag hydroxide-simeth (MAALOX/MYLANTA) 200-200-20 MG/5ML suspension 30 mL (30 mLs Oral Given 04/23/22 0251)    And  lidocaine (XYLOCAINE) 2 % viscous mouth  solution 15 mL (15 mLs Oral Given 04/23/22 0251)  amLODipine (NORVASC) tablet 5 mg (5 mg Oral Given 04/23/22 0617)  methocarbamol (ROBAXIN) tablet 500 mg (500 mg Oral Given 04/23/22 3790)    ED Course/ Medical Decision Making/ A&P                           Medical Decision Making Amount and/or Complexity of Data Reviewed Radiology: ordered.  Risk OTC drugs. Prescription drug management.  CTA dissection done and showed no obvious pancreatic (independently viewed and interpreted by myself and radiology read reviewed).  Consider biliary vs PUD vs cardiac causes. First troponin negative and ecg without obvious ischemic changes.   Troponins negative. [place] negative. Stomach improved significantly with maalox/lidocaine - will d/c on protonix for presumed PUD/gastritis.   Back pain improved significantly with medications/muscle relaxants here and able to  ambulate without difficulty. Possibly MSK? Stable for d/c with pain doc and pcp follow up.   Final Clinical Impression(s) / ED Diagnoses Final diagnoses:  Bilateral back pain, unspecified back location, unspecified chronicity  Abdominal pain, epigastric  Essential hypertension    Rx / DC Orders ED Discharge Orders          Ordered    pantoprazole (PROTONIX) 20 MG tablet  Daily        04/23/22 0718    alum & mag hydroxide-simeth (MAALOX PLUS) 400-400-40 MG/5ML suspension  Every 6 hours PRN        04/23/22 0718              Irais Mottram, Barbara Cower, MD 04/23/22 [phone]

## 2022-04-27 ENCOUNTER — Ambulatory Visit: Payer: Medicare Other | Admitting: Physical Therapy

## 2022-04-27 ENCOUNTER — Encounter: Payer: Self-pay | Admitting: Physical Therapy

## 2022-04-27 DIAGNOSIS — G8929 Other chronic pain: Secondary | ICD-10-CM

## 2022-04-27 DIAGNOSIS — M533 Sacrococcygeal disorders, not elsewhere classified: Secondary | ICD-10-CM | POA: Diagnosis not present

## 2022-04-27 DIAGNOSIS — M25562 Pain in left knee: Secondary | ICD-10-CM

## 2022-04-27 DIAGNOSIS — M6281 Muscle weakness (generalized): Secondary | ICD-10-CM

## 2022-04-27 NOTE — Therapy (Signed)
OUTPATIENT PHYSICAL THERAPY TREATMENT NOTE   Patient Name: Kaitlyn Parks MRN: [id number] DOB:30-Aug-1934, 87 y.o., female Today's Date: 04/27/2022   END OF SESSION:   PT End of Session - 04/27/22 1210     Visit Number 13    Number of Visits 24    Date for PT Re-Evaluation 06/28/22    Authorization Type [employer]    Progress Note Due on Visit 10    PT Start Time 1215    PT Stop Time 9357    PT Time Calculation (min) 41 min    Activity Tolerance Patient tolerated treatment well    Behavior During Therapy [facility] for tasks assessed/performed                  Past Medical History:  Diagnosis Date   Arthritis    DDD, scoliosis, sees Dr. Arnoldo Morale for this, uses norco very rarely for pain   Bradycardia 11/11/2017   CAD (coronary artery disease)    LAD stenting of a 90% lesion 2012   Cystocele    Educated about COVID-19 virus infection 12/06/2019   Elevated cholesterol    GERD (gastroesophageal reflux disease)    dx in work up 2016 for atypical CP at OSH   pt. denies   Heart murmur    Hypertensive retinopathy    OU   Hypothyroidism    Interstitial cystitis    sees Dr. Jeffie Pollock   Macular degeneration    OU   NSTEMI (non-ST elevated myocardial infarction) [facility])    Osteoporosis    Rectocele    S/P hip replacement, right 06/12/2017   Scoliosis    Thyroid disease    Hypothyroid   Urinary incontinence    USI   Uterine prolapse    Past Surgical History:  Procedure Laterality Date   BLADDER SUSPENSION  2011   CATARACT EXTRACTION Bilateral 2015   Dr. Herbert Deaner   CORONARY ANGIOPLASTY WITH STENT PLACEMENT  04/21/2010   LAD 80%, RCA 30%, nl EF, s/p DES LAD   EYE SURGERY     LEFT HEART CATH AND CORONARY ANGIOGRAPHY N/A 06/14/2017   Procedure: LEFT HEART CATH AND CORONARY ANGIOGRAPHY;  Surgeon: Lorretta Harp, MD;  Location: [place] [facility];  Service: Cardiovascular;  Laterality: N/A;   OOPHORECTOMY  2011   BSO   TOTAL HIP ARTHROPLASTY Right 06/10/2017   Procedure: RIGHT TOTAL  HIP ARTHROPLASTY ANTERIOR APPROACH;  Surgeon: Rod Can, MD;  Location: [facility];  Service: Orthopedics;  Laterality: Right;  Needs RNFA   VAGINAL HYSTERECTOMY  2011   LAV[facility] BSO; benign   Patient Active Problem List   Diagnosis Date Noted   Aortic atherosclerosis ([place]) 12/07/2019   Macular degeneration 01/11/2019   Coronary artery disease involving native coronary artery of native heart without angina pectoris 10/29/2018   Presence of intraocular lens 09/23/2018   Bradycardia 11/11/2017   Dyslipidemia 11/11/2017   Pain in joint of right hip 08/04/2017   CAD (coronary artery disease) 06/12/2017   GERD (gastroesophageal reflux disease) 06/12/2017   S/P hip replacement, right 06/12/2017   Osteoarthritis of right hip 06/10/2017   CAD S/P percutaneous coronary angioplasty 02/11/2015   Renal insufficiency 02/11/2015   Prolonged Q-T interval on ECG 04/18/2010   Hypertension 08/11/2007   Hypothyroidism (acquired) 06/08/2007   MITRAL VALVE PROLAPSE 06/08/2007   PCP: [name], MD   REFERRING PROVIDER: Reece Agar   REFERRING DIAG: Sacroiliitis M46.1   Rationale for Evaluation and Treatment Rehabilitation   THERAPY DIAG:  Sacroiliac joint dysfunction   Muscle weakness (generalized)   Chronic pain of left knee   ONSET DATE: 4 years    SUBJECTIVE:                                                                                                                                                                                            SUBJECTIVE STATEMENT:  .04/27/2022 States she went to the ER as she was having extreme pain in the back as she was concerned it was a heart issue but she was cleared for heart issues and sees the MD after her apt on wednesday   Eval: Patient complains of chronic left SIJ pain that has been managed with quarterly injections. Reports she also has left knee pain that wakes her up at night and this is her biggest complaint. States that she also  uses heat and ice and this helps. She is active in water classes Tues/Thurs in the AM and would liek to go back to yoga.   PERTINENT HISTORY:  Scoliosis, R THA 5 years ago, history of cardiac issues   PAIN:  Are you having pain? Yes: NPRS scale: 2/10 - 4/10 pain in her upper back Pain location: left knee pain Pain description: throbbing Aggravating factors: aggressive aquatic routine, long distance walking, grocery store Relieving factors: ice, rest     PRECAUTIONS: None   WEIGHT BEARING RESTRICTIONS No   FALLS:  Has patient fallen in last 6 months? No   LIVING ENVIRONMENT: Lives with: lives alone Lives in: House/apartment     OCCUPATION: not working, likes to carden in her potted plants    PLOF: Independent   PATIENT GOALS to not have left knee pain. To go back to yoga (stopped 7 years ago)     OBJECTIVE:    DIAGNOSTIC FINDINGS:  None recent at [facility]     SCREENING FOR RED FLAGS: Bowel or bladder incontinence: No Spinal tumors: No Cauda equina syndrome: No Compression fracture: No Abdominal aneurysm: No   COGNITION:           Overall cognitive status: Within functional limits for tasks assessed                              MUSCLE LENGTH: Hamstrings: Right 30 deg; Left 30 deg     POSTURE: S curve in spine, right hip higher than left, forward head.    PALPATION: Tenderness to palpation along glute meds and lumbar paraspinals              LE Measurements  Lower Extremity Right 03/03/2022 Left 03/03/2022 R/L 09/14     A/PROM MMT A/PROM MMT A/PROM MMT  Hip Flexion 105 4 90 4- /110 4/4  Hip Extension            Hip Abduction            Hip Adduction            Hip Internal rotation 45   5*   30/ 10*   Hip External rotation 40   10   32/ 35   Knee Flexion 120 4 120* 4- 120/ 120 4/4  Knee Extension 0 4 8 lacking extension 4- 0/ Lacking 3 4/4  Ankle Dorsiflexion            Ankle Plantarflexion            Ankle Inversion            Ankle Eversion              (Blank rows = not tested)            * pain     LUMBAR SPECIAL TESTS:  Positive ely's test on left    TODAY'S TREATMENT  04/27/2022 [facility] Adult PT Treatment:                                                DATE: 04/16/2022 Therapeutic Exercise: Supine: 90/90 position -scapular protraction 2x15 5" holds, thoracic hug rocking back and forth - x20 5" holds Standing: shoulder extension with towel x30 5" holds Manual Therapy: Theragun and STM to rhomboid and thoracic paraspinals - tolerated well Thermotherapy - to upper back - tolerated well, tibial bouncing on ball 5 minutes     PATIENT EDUCATION:  Education details: on HEP, on anatomy, [place] for interventions and exercises.  Person educated: Patient Education method: Explanation, Demonstration, and Handouts Education comprehension: verbalized understanding   HOME EXERCISE PROGRAM: [id number]   ASSESSMENT:   CLINICAL IMPRESSION: 04/27/2022 Overall patient tolerated session well. Reduced pain but slight increase in soreness along area of pain noted end of session. Educated patient on use of ice and heat for soreness. Added new exercises to HEP. Will continue with current POC as toelrated.   Eval: Patient complains of left SIJ and left knee pain that started years ago. SIJ pain is managed by injections and this will also help with the knee pain. Patient limited in left hip ROM in all directions with pain in knee presenting with left hip IR. Educated patient on findings and POC moving forward. Patient would greatly benefit from skilled PT to improve overall function and QOL.      OBJECTIVE IMPAIRMENTS Abnormal gait, decreased activity tolerance, decreased balance, decreased mobility, difficulty walking, decreased ROM, decreased strength, postural dysfunction, and pain.    ACTIVITY LIMITATIONS sleeping and locomotion level   PARTICIPATION LIMITATIONS: shopping, community activity, and yard work   PERSONAL FACTORS Age and 1-2  comorbidities: chronic left SIJ pain, R THA   are also affecting patient's functional outcome.    REHAB POTENTIAL: Good   CLINICAL DECISION MAKING: Stable/uncomplicated   EVALUATION COMPLEXITY: Low   GOALS: Goals reviewed with patient?  yes   SHORT TERM GOALS:   Patient will be independent in self management strategies to improve quality of life and functional outcomes. Baseline: new program Target date: 04/07/2022 Goal status: MET  2.  Patient will report at least 50% improvement in overall symptoms and/or function to demonstrate improved functional mobility Baseline: 0% Target date: 04/07/2022 Goal status: MET    3.  Patient will be able to report sleeping through the night without waking up secondary to pain in her left knee to improve sleep quality. Baseline: painful Target date: 05/07/2022 Goal status: ONGOING, Pt intermittently has been taking pain meds to sleep through the night. She says she only takes pain meds when she does water aerobics.      LONG TERM GOALS:   Patient will report at least 75% improvement in overall symptoms and/or function to demonstrate improved functional mobility Baseline: 0% Target date: 05/28/2022 Goal status: 40% improvements per pt.    2.  Patient will demonstrate at least 15 degrees of left hip IR and ER to improve ROM in hip  Baseline: see above Target date: 05/28/2022 Goal status: 10 degrees internal rotation on L LE.    3.  Patient will report ability to perform more LE exercises while in aquatics to improve strength and mobility. Baseline: unable Target date: 05/12/2022 Goal status: MET   PLAN: PT FREQUENCY: 2x/week    PT DURATION: 6 weeks   PLANNED INTERVENTIONS: Therapeutic exercises, Therapeutic activity, Neuromuscular re-education, Balance training, Gait training, Patient/Family education, Self Care, Joint mobilization, Joint manipulation, Orthotic/Fit training, DME instructions, Aquatic Therapy, Dry Needling, Electrical  stimulation, Spinal manipulation, Spinal mobilization, Cryotherapy, Moist heat, Ionotophoresis 4mg /ml Dexamethasone, and Manual therapy.   PLAN FOR NEXT SESSION: patient likes some form of manual (gentle STM, tibial bouncing) then exercises, gentle knee ext,ankle and hip mobility L.; left hip STM, mobilizations, long axis traction, hip IR/ER Ext/flexion, isometrics and strengthening   1:02 PM, 04/27/22 Jerene Pitch, DPT Physical Therapy with [facility]

## 2022-04-29 ENCOUNTER — Encounter: Payer: Medicare Other | Admitting: Physical Therapy

## 2022-04-29 ENCOUNTER — Ambulatory Visit: Payer: Medicare Other | Admitting: Physical Therapy

## 2022-04-29 ENCOUNTER — Encounter: Payer: Self-pay | Admitting: Physical Therapy

## 2022-04-29 DIAGNOSIS — M6281 Muscle weakness (generalized): Secondary | ICD-10-CM | POA: Diagnosis not present

## 2022-04-29 DIAGNOSIS — M25562 Pain in left knee: Secondary | ICD-10-CM | POA: Diagnosis not present

## 2022-04-29 DIAGNOSIS — G8929 Other chronic pain: Secondary | ICD-10-CM

## 2022-04-29 DIAGNOSIS — M533 Sacrococcygeal disorders, not elsewhere classified: Secondary | ICD-10-CM | POA: Diagnosis not present

## 2022-04-29 NOTE — Therapy (Signed)
OUTPATIENT PHYSICAL THERAPY TREATMENT NOTE   Patient Name: Kaitlyn Parks MRN: [id number] DOB:1934-08-14, 87 y.o., female Today's Date: 04/29/2022   END OF SESSION:   PT End of Session - 04/29/22 1105     Visit Number 14    Number of Visits 24    Date for PT Re-Evaluation 06/28/22    Authorization Type [employer]    Progress Note Due on Visit 10    PT Start Time 1104    PT Stop Time 1142    PT Time Calculation (min) 38 min    Activity Tolerance Patient tolerated treatment well    Behavior During Therapy WFL for tasks assessed/performed                  Past Medical History:  Diagnosis Date   Arthritis    DDD, scoliosis, sees Dr. Arnoldo Morale for this, uses norco very rarely for pain   Bradycardia 11/11/2017   CAD (coronary artery disease)    LAD stenting of a 90% lesion 2012   Cystocele    Educated about COVID-19 virus infection 12/06/2019   Elevated cholesterol    GERD (gastroesophageal reflux disease)    dx in work up 2016 for atypical CP at OSH   pt. denies   Heart murmur    Hypertensive retinopathy    OU   Hypothyroidism    Interstitial cystitis    sees Dr. Jeffie Pollock   Macular degeneration    OU   NSTEMI (non-ST elevated myocardial infarction) [facility])    Osteoporosis    Rectocele    S/P hip replacement, right 06/12/2017   Scoliosis    Thyroid disease    Hypothyroid   Urinary incontinence    USI   Uterine prolapse    Past Surgical History:  Procedure Laterality Date   BLADDER SUSPENSION  2011   CATARACT EXTRACTION Bilateral 2015   Dr. Herbert Deaner   CORONARY ANGIOPLASTY WITH STENT PLACEMENT  04/21/2010   LAD 80%, RCA 30%, nl EF, s/p DES LAD   EYE SURGERY     LEFT HEART CATH AND CORONARY ANGIOGRAPHY N/A 06/14/2017   Procedure: LEFT HEART CATH AND CORONARY ANGIOGRAPHY;  Surgeon: Lorretta Harp, MD;  Location: [place] [facility];  Service: Cardiovascular;  Laterality: N/A;   OOPHORECTOMY  2011   BSO   TOTAL HIP ARTHROPLASTY Right 06/10/2017   Procedure: RIGHT TOTAL  HIP ARTHROPLASTY ANTERIOR APPROACH;  Surgeon: Rod Can, MD;  Location: [facility];  Service: Orthopedics;  Laterality: Right;  Needs RNFA   VAGINAL HYSTERECTOMY  2011   LA[facility] BSO; benign   Patient Active Problem List   Diagnosis Date Noted   Aortic atherosclerosis ([place]) 12/07/2019   Macular degeneration 01/11/2019   Coronary artery disease involving native coronary artery of native heart without angina pectoris 10/29/2018   Presence of intraocular lens 09/23/2018   Bradycardia 11/11/2017   Dyslipidemia 11/11/2017   Pain in joint of right hip 08/04/2017   CAD (coronary artery disease) 06/12/2017   GERD (gastroesophageal reflux disease) 06/12/2017   S/P hip replacement, right 06/12/2017   Osteoarthritis of right hip 06/10/2017   CAD S/P percutaneous coronary angioplasty 02/11/2015   Renal insufficiency 02/11/2015   Prolonged Q-T interval on ECG 04/18/2010   Hypertension 08/11/2007   Hypothyroidism (acquired) 06/08/2007   MITRAL VALVE PROLAPSE 06/08/2007   PCP: [name], MD   REFERRING PROVIDER: Reece Agar   REFERRING DIAG: Sacroiliitis M46.1   Rationale for Evaluation and Treatment Rehabilitation   THERAPY DIAG:  Sacroiliac joint dysfunction   Muscle weakness (generalized)   Chronic pain of left knee   ONSET DATE: 4 years    SUBJECTIVE:                                                                                                                                                                                            SUBJECTIVE STATEMENT:  .04/29/2022 States she did her exercises late last night and thinks that wasn't a good a idea. States that she is still having some back pain. States that she tried her compression hoes and that seemed to help her knee pain. States she was fine after last session.   Eval: Patient complains of chronic left SIJ pain that has been managed with quarterly injections. Reports she also has left knee pain that wakes her up at  night and this is her biggest complaint. States that she also uses heat and ice and this helps. She is active in water classes Tues/Thurs in the AM and would liek to go back to yoga.   PERTINENT HISTORY:  Scoliosis, R THA 5 years ago, history of cardiac issues   PAIN:  Are you having pain? Yes: NPRS scale: 2/10 in the knee- 2/10 pain in her upper back Pain location: left knee pain Pain description: throbbing Aggravating factors: aggressive aquatic routine, long distance walking, grocery store Relieving factors: ice, rest     PRECAUTIONS: None   WEIGHT BEARING RESTRICTIONS No   FALLS:  Has patient fallen in last 6 months? No   LIVING ENVIRONMENT: Lives with: lives alone Lives in: House/apartment     OCCUPATION: not working, likes to carden in her potted plants    PLOF: Independent   PATIENT GOALS to not have left knee pain. To go back to yoga (stopped 7 years ago)     OBJECTIVE:    DIAGNOSTIC FINDINGS:  None recent at [facility]     SCREENING FOR RED FLAGS: Bowel or bladder incontinence: No Spinal tumors: No Cauda equina syndrome: No Compression fracture: No Abdominal aneurysm: No   COGNITION:           Overall cognitive status: Within functional limits for tasks assessed                              MUSCLE LENGTH: Hamstrings: Right 30 deg; Left 30 deg     POSTURE: S curve in spine, right hip higher than left, forward head.    PALPATION: Tenderness to palpation along glute meds and lumbar paraspinals  LE Measurements         Lower Extremity Right 03/03/2022 Left 03/03/2022 R/L 09/14     A/PROM MMT A/PROM MMT A/PROM MMT  Hip Flexion 105 4 90 4- /110 4/4  Hip Extension            Hip Abduction            Hip Adduction            Hip Internal rotation 45   5*   30/ 10*   Hip External rotation 40   10   32/ 35   Knee Flexion 120 4 120* 4- 120/ 120 4/4  Knee Extension 0 4 8 lacking extension 4- 0/ Lacking 3 4/4  Ankle Dorsiflexion             Ankle Plantarflexion            Ankle Inversion            Ankle Eversion             (Blank rows = not tested)            * pain     LUMBAR SPECIAL TESTS:  Positive ely's test on left    TODAY'S TREATMENT  04/29/2022 TE:  Supine:on ball hamstring iso 5" holds 2 minutes, SAQs on ball 3x10 B, DKC to tolerance 1 minute x2 on ball, hip add isometrics ball 2 minutes 5" holds, hip abd into band 5" holds 2 minutes Manual: tibial bouncing 5 minutes B, IASTM with percussion gun on L LE - tolerated well    PATIENT EDUCATION:  Education details: on how to safely use own theragun and using cushion when always using theragun. Person educated: Patient Education method: Explanation, Demonstration, and Handouts Education comprehension: verbalized understanding   HOME EXERCISE PROGRAM: [id number]   ASSESSMENT:   CLINICAL IMPRESSION: 04/29/2022 Session focused on education for safe use of percussion gun at home. Answered all questions. Left knee biggest complaint on this date. Improved with manual and TE interventions. Reduced pain in leg noted end of session. Will continue with current POC as tolerated  Eval: Patient complains of left SIJ and left knee pain that started years ago. SIJ pain is managed by injections and this will also help with the knee pain. Patient limited in left hip ROM in all directions with pain in knee presenting with left hip IR. Educated patient on findings and POC moving forward. Patient would greatly benefit from skilled PT to improve overall function and QOL.      OBJECTIVE IMPAIRMENTS Abnormal gait, decreased activity tolerance, decreased balance, decreased mobility, difficulty walking, decreased ROM, decreased strength, postural dysfunction, and pain.    ACTIVITY LIMITATIONS sleeping and locomotion level   PARTICIPATION LIMITATIONS: shopping, community activity, and yard work   PERSONAL FACTORS Age and 1-2 comorbidities: chronic left SIJ pain, R THA   are also  affecting patient's functional outcome.    REHAB POTENTIAL: Good   CLINICAL DECISION MAKING: Stable/uncomplicated   EVALUATION COMPLEXITY: Low   GOALS: Goals reviewed with patient?  yes   SHORT TERM GOALS:   Patient will be independent in self management strategies to improve quality of life and functional outcomes. Baseline: new program Target date: 04/07/2022 Goal status: MET    2.  Patient will report at least 50% improvement in overall symptoms and/or function to demonstrate improved functional mobility Baseline: 0% Target date: 04/07/2022 Goal status: MET    3.  Patient will be able to  report sleeping through the night without waking up secondary to pain in her left knee to improve sleep quality. Baseline: painful Target date: 05/07/2022 Goal status: ONGOING, Pt intermittently has been taking pain meds to sleep through the night. She says she only takes pain meds when she does water aerobics.      LONG TERM GOALS:   Patient will report at least 75% improvement in overall symptoms and/or function to demonstrate improved functional mobility Baseline: 0% Target date: 05/28/2022 Goal status: 40% improvements per pt.    2.  Patient will demonstrate at least 15 degrees of left hip IR and ER to improve ROM in hip  Baseline: see above Target date: 05/28/2022 Goal status: 10 degrees internal rotation on L LE.    3.  Patient will report ability to perform more LE exercises while in aquatics to improve strength and mobility. Baseline: unable Target date: 05/12/2022 Goal status: MET   PLAN: PT FREQUENCY: 2x/week    PT DURATION: 6 weeks   PLANNED INTERVENTIONS: Therapeutic exercises, Therapeutic activity, Neuromuscular re-education, Balance training, Gait training, Patient/Family education, Self Care, Joint mobilization, Joint manipulation, Orthotic/Fit training, DME instructions, Aquatic Therapy, Dry Needling, Electrical stimulation, Spinal manipulation, Spinal mobilization,  Cryotherapy, Moist heat, Ionotophoresis 33m/ml Dexamethasone, and Manual therapy.   PLAN FOR NEXT SESSION: patient likes some form of manual (gentle STM, tibial bouncing) then exercises, gentle knee ext,ankle and hip mobility L.; left hip STM, mobilizations, long axis traction, hip IR/ER Ext/flexion, isometrics and strengthening   11:49 AM, 04/29/22 MJerene Pitch DPT Physical Therapy with [facility]

## 2022-05-01 NOTE — Progress Notes (Shared)
[facility] Note  05/04/2022    CHIEF COMPLAINT Patient presents for Retina Follow Up  HISTORY OF PRESENT ILLNESS: Kaitlyn Parks is a 87 y.o. female who presents to the clinic today for:   HPI     Retina Follow Up   Patient presents with  Wet AMD.  In left eye.  Severity is moderate.  Duration of 8 weeks.  Since onset it is stable.  I, the attending physician,  performed the HPI with the patient and updated documentation appropriately.        Comments   Pt here for 8 wk ret f/u exu ARMD OS. Pt states VA is about the same though she is having more difficulty reading. Pt received new rx from Dr. Herbert Deaner but has not yet filled them. Waited to see Dr. Coralyn Pear first.       Last edited by Bernarda Caffey, MD on 05/04/2022  2:08 PM.     Pt states she went to Dr. Payton Emerald office for a new glasses rx, she states her left eye has lost some vision, she was 20/70 in [facility] office, she states she hasn''t gotten her glasses yet, she was waiting until after this appt   Referring physician: Monna Fam, MD [place],  [place] [address]  HISTORICAL INFORMATION:  Selected notes from the [facility] Referred by Dr. Martinique DeMarco for concern of exu ARMD LEE: 11.26.19 (J. 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 (Ophthalmic Drugs)   No current facility-administered medications for this visit. (Ophthalmic Drugs)   Current Outpatient Medications (Other)  Medication Sig   alum & mag hydroxide-simeth (MAALOX PLUS) 400-400-40 MG/5ML suspension Take 15 mLs by mouth every 6 (six) hours as needed for indigestion.   amLODipine (NORVASC) 2.5 MG tablet Take 1 tablet (2.5 mg total) by mouth daily.   aspirin EC 81 MG tablet Take 81 mg by mouth daily.   Biotin w/ Vitamins C & E  (HAIR/SKIN/NAILS PO) Take 1 tablet by mouth daily.   Cranberry 400 MG CAPS Take 400 mg by mouth daily.   hydrALAZINE (APRESOLINE) 10 MG tablet TAKE (1) TABLET TWICE A DAY. MAY TAKE EXTRA DOSE FOR SBP >160 UP TO A TOTAL OF 4 TIMES A DAY. (Patient taking differently: TAKE (1) TABLET TWICE A DAY. MAY TAKE EXTRA DOSE FOR SBP >160 UP TO A TOTAL OF 4 TIMES A DAY. As Needed)   isosorbide mononitrate (IMDUR) 60 MG 24 hr tablet TAKE 1 & 1/2 TABLETS A DAY.   Multiple Vitamins-Calcium (ONE-A-DAY WOMENS PO) Take 1 tablet by mouth daily.    nitrofurantoin, macrocrystal-monohydrate, (MACROBID) 100 MG capsule Take by mouth. Takes as Needed   nitroGLYCERIN (NITROSTAT) 0.4 MG SL tablet DISSOLVE 1 TABLET UNDER TONGUE IF NEEDED FOR CHEST PAIN. MAY REPEAT IN 5 MINUTES FOR 3 DOSES.   PRESCRIPTION MEDICATION Avastin eye injections given by Dr Coralyn Pear due to macular degeneration   rosuvastatin (CRESTOR) 10 MG tablet TABLET ONE-HALF TABLET BY MOUTH DAILY   SYNTHROID 50 MCG tablet TAKE 1 TABLET EACH DAY.   pantoprazole (PROTONIX) 20 MG tablet Take 1 tablet (20 mg total) by mouth daily for 10 days.   No current facility-administered medications for this visit. (Other)   Facility-Administered Medications Ordered in Other Visits (Other)  Medication Route   technetium tetrofosmin (TC-MYOVIEW) injection 29.5 millicurie Intravenous  REVIEW OF SYSTEMS: ROS   Positive for: Gastrointestinal, Genitourinary, Musculoskeletal, Cardiovascular, Eyes Negative for: Constitutional, Neurological, Skin, HENT, Endocrine, Respiratory, Psychiatric, Allergic/Imm, Heme/Lymph Last edited by Kingsley Spittle, COT on 05/04/2022  1:04 PM.      ALLERGIES Allergies  Allergen Reactions   Acyclovir And Related Other (See Comments)    unknown   Pravachol [Pravastatin Sodium] Other (See Comments)    cystitis   Sulfa Antibiotics     nausea   Zocor [Simvastatin] Other (See Comments)    cystitis   PAST MEDICAL HISTORY Past Medical  History:  Diagnosis Date   Arthritis    DDD, scoliosis, sees Dr. Arnoldo Morale for this, uses norco very rarely for pain   Bradycardia 11/11/2017   CAD (coronary artery disease)    LAD stenting of a 90% lesion 2012   Cystocele    Educated about COVID-19 virus infection 12/06/2019   Elevated cholesterol    GERD (gastroesophageal reflux disease)    dx in work up 2016 for atypical CP at OSH   pt. denies   Heart murmur    Hypertensive retinopathy    OU   Hypothyroidism    Interstitial cystitis    sees Dr. Jeffie Pollock   Macular degeneration    OU   NSTEMI (non-ST elevated myocardial infarction) [facility])    Osteoporosis    Rectocele    S/P hip replacement, right 06/12/2017   Scoliosis    Thyroid disease    Hypothyroid   Urinary incontinence    USI   Uterine prolapse    Past Surgical History:  Procedure Laterality Date   BLADDER SUSPENSION  2011   CATARACT EXTRACTION Bilateral 2015   Dr. Herbert Deaner   CORONARY ANGIOPLASTY WITH STENT PLACEMENT  04/21/2010   LAD 80%, RCA 30%, nl EF, s/p DES LAD   EYE SURGERY     LEFT HEART CATH AND CORONARY ANGIOGRAPHY N/A 06/14/2017   Procedure: LEFT HEART CATH AND CORONARY ANGIOGRAPHY;  Surgeon: Lorretta Harp, MD;  Location: [facility];  Service: Cardiovascular;  Laterality: N/A;   OOPHORECTOMY  2011   BSO   TOTAL HIP ARTHROPLASTY Right 06/10/2017   Procedure: RIGHT TOTAL HIP ARTHROPLASTY ANTERIOR APPROACH;  Surgeon: Rod Can, MD;  Location: [facility];  Service: Orthopedics;  Laterality: Right;  Needs RNFA   VAGINAL HYSTERECTOMY  2011   LAVH BSO; benign   FAMILY HISTORY Family History  Problem Relation Age of Onset   Hypertension Mother    Heart disease Mother    Heart disease Father    Heart disease Sister    Diabetes Sister    Uterine cancer Sister        mets to lungs   Lung cancer Sister    Scoliosis Sister    Heart disease Brother    Colon cancer Paternal Aunt 21   Breast cancer Paternal 87        Age 31's   Breast cancer  Paternal Aunt    Leukemia Paternal Aunt    Lung cancer Paternal Grandfather        smoker   Arthritis Daughter    Breast cancer Cousin        Maternal 1st cousins-Age 41's   Esophageal cancer Neg Hx    Pancreatic cancer Neg Hx    Stomach cancer Neg Hx    SOCIAL HISTORY Social History   Tobacco Use   Smoking status: Never   Smokeless tobacco: Never  Vaping Use   Vaping Use: Never used  Substance Use Topics   Alcohol use: No    Comment: Rare   Drug use: No       OPHTHALMIC EXAM: Base Eye Exam     Visual Acuity (Snellen - Linear)       Right Left   Dist [place] 20/30 20/70   Dist ph [place] 20/30 +1 20/60 -1         Tonometry (Tonopen, 1:18 PM)       Right Left   Pressure 13 12         Pupils       Dark Light Shape React APD   Right 3 2 Round Brisk None   Left 3 2 Round Brisk None         Visual Fields (Counting fingers)       Left Right    Full Full         Extraocular Movement       Right Left    Full, Ortho Full, Ortho         Neuro/Psych     Oriented x3: Yes   Mood/Affect: Normal         Dilation     Both eyes: 1.0% Mydriacyl, 2.5% Phenylephrine @ 1:18 PM           Slit Lamp and Fundus Exam     Slit Lamp Exam       Right Left   Lids/Lashes mild Telangiectasia, marginal lesion nasal UL Telangiectasia, Meibomian gland dysfunction, lower lid edema   Conjunctiva/Sclera White and quiet White and quiet, inferior conj chalasis   Cornea Arcus, 1+ Punctate epithelial erosions, fine endo pigment 1+ punctate epithelial erosions, Arcus, trace, fine endo pigment   Anterior Chamber Deep and clear Deep and clear   Iris Round and dilated Round and well dilated   Lens PC IOL in good position, trace Posterior capsular opacification (linear, extending just to visual axis) PC IOL in excellent position   Anterior Vitreous Vitreous syneresis, Posterior vitreous detachment Vitreous syneresis, Posterior vitreous detachment, vitreous condensations          Fundus Exam       Right Left   Disc Pink and Sharp, Compact, focal PPP Pink and Sharp, mild temporal PPA/PPP   C/D Ratio 0.3 0.3   Macula Blunted foveal reflex, Drusen, RPE mottling and clumping, early Atrophy, +PEDs -- stably improved, trace cystic changes, No frank heme Blunted foveal reflex, +drusen, pigment clumping, RPE mottling, clumping and atrophy, no heme, central PED / CNV with overlying cystic changes -- stably improved, +GA   Vessels attenuated, Tortuous attenuated, Tortuous   Periphery Attached, scattered reticular degeneration   Attached, scattered reticular degeneration           Refraction     Manifest Refraction       Sphere Cylinder Axis Dist VA   Right [place] +0.50 005 20/40   Left -0.75 +0.50 180 20/50           IMAGING AND PROCEDURES  Imaging and Procedures for _0 @  OCT, Retina - OU - Both Eyes       Right Eye Quality was good. Central Foveal Thickness: 236. Progression has worsened. Findings include no SRF, abnormal foveal contour, retinal drusen , intraretinal hyper-reflective material, intraretinal fluid, pigment epithelial detachment, outer retinal atrophy (Interval development of focal inter retinal cyst nasal fovea, patchy ORA / GA).   Left Eye Quality was good. Central Foveal Thickness: 259. Progression has been stable. Findings include normal foveal contour,  no IRF, no SRF, retinal drusen , intraretinal hyper-reflective material, pigment epithelial detachment, outer retinal atrophy (stable improvement in IRF/cystic changes, mild interval increase in [facility] / PED inferior to fovea).   Notes *Images captured and stored on drive  Diagnosis / Impression:  OD: exudative ARMD -- Interval development of focal inter retinal cyst nasal fovea, patchy ORA / GA OS: exu-ARMD -- stable improvement in IRF/cystic changes, mild interval increase in [facility] / PED inferior to fovea  Clinical management:  See below  Abbreviations: NFP - Normal foveal  profile. CME - cystoid macular edema. PED - pigment epithelial detachment. IRF - intraretinal fluid. SRF - subretinal fluid. EZ - ellipsoid zone. ERM - epiretinal membrane. ORA - outer retinal atrophy. ORT - outer retinal tubulation. SRHM - subretinal hyper-reflective material      Intravitreal Injection, Pharmacologic Agent - OD - Right Eye       Time Out 05/04/2022. 1:52 PM. Confirmed correct patient, procedure, site, and patient consented.   Anesthesia Topical anesthesia was used. Anesthetic medications included Lidocaine 2%, Proparacaine 0.5%.   Procedure Preparation included 5% betadine to ocular surface, eyelid speculum. A supplied needle was used.   Injection: 1.25 mg Bevacizumab 1.11m/0.05ml   Route: Intravitreal, Site: Right Eye   NDC:: 63875-643-32 Lot: [id number] , Expiration date: 06/03/2022   Post-op Post injection exam found visual acuity of at least counting fingers. The patient tolerated the procedure well. There were no complications. The patient received written and verbal post procedure care education. Post injection medications were not given.      Intravitreal Injection, Pharmacologic Agent - OS - Left Eye       Time Out 05/04/2022. 1:52 PM. Confirmed correct patient, procedure, site, and patient consented.   Anesthesia Topical anesthesia was used. Anesthetic medications included Lidocaine 2%, Proparacaine 0.5%.   Procedure Preparation included 5% betadine to ocular surface, eyelid speculum. A (32g) needle was used.   Injection: 1.25 mg Bevacizumab 1.242m0.05ml   Route: Intravitreal, Site: Left Eye   NDC: : 95188-416-60Lot: : [id number] date: 05/26/2022   Post-op Post injection exam found visual acuity of at least counting fingers. The patient tolerated the procedure well. There were no complications. The patient received written and verbal post procedure care education. Post injection medications were not given.              ASSESSMENT/PLAN:    ICD-10-CM   1. Exudative age-related macular degeneration of left eye with active choroidal neovascularization (HCC)  H35.3221 OCT, Retina - OU - Both Eyes    Intravitreal Injection, Pharmacologic Agent - OD - Right Eye    Intravitreal Injection, Pharmacologic Agent - OS - Left Eye    Bevacizumab (AVASTIN) SOLN 1.25 mg    Bevacizumab (AVASTIN) SOLN 1.25 mg    2. Intermediate stage nonexudative age-related macular degeneration of right eye  H35.3112     3. Essential hypertension  I10     4. Hypertensive retinopathy of both eyes  H35.033     5. Pseudophakia of both eyes  Z96.1     6. Dry eyes  H04.123        1. Exudative age-related macular degeneration, left eye   - OCT 4.30.2020 showed interval conversion of OS from nonexudative ARMD to exu-ARMD with large dome-shaped PED with overlying IRF/SRF  - FA 05.28.20 -- no active CNV OS, just staining  - s/p IVA OS #1 (04.30.20), #2 (05.28.20), #3 (06.26.20), #4 (08.03.20), #5 (09.11.20), #6 (10.19.20), #7 (11.23.20),  - reactivation of CNV noted on  05.05.22 -- s/p IVA OS  #8 (05.05.22), #9 (06.06.22), #10 (07.19.22), #11 (09.13.22), #12 (10.25.22), #13 (12.13.22), #14 (01.30.23), #15 (3.20.23), #16 (05.08.23), #17 (06.19.23)  - **Interval increase in IRF overlying PED at 8 wks on 09.13.22 visit**  - BCVA OS 20/60 -- decreased from 20/40  - OCT shows OS: OD: stable improvement in IRF/cystic changes, mild interval increase in [facility] / PED inferior to fovea at 8 weeks  - recommend IVA OS #18 today, 08.07.23 with f/u back to 6-7 weeks   - pt wishes to proceed with injection  - RBA of procedure discussed, questions answered  - IVA informed consent obtained and re-signed, 05.08.23 (OU)  - see procedure note  - benefits investigation for [employer] initiated 4.30.2020 -- approved for 2022  - f/u in 6-7 wks -- DFE/OCT, possible injection -- tx and ext as able  2. Age related macular degeneration, exudative, OD  - interval  development of IRF first noted on 09.11.20 -- conversion from nonexudative to exudative ARMD  - pt initially presented on 11.27.19 due to alert from [facility] home monitoring system for OD  - [facility] prescribed by Dr. Herbert Deaner  - FA (5.28.2020) showed staining / window defect corresponding temporal RPE changes OU -- no active CNV OU  - S/P IVA OD #1 (09.11.20), #2 (10.19.20), #3 (11.23.20), #4 (01.07.21), #5 (2.19.21), #6 (04.02.21), #7 (05.28.21), #8 (07.23.21), #9 (09.24.21), #10 (11.29.21), #11 (02.11.22), #12 (05.05.22), #13 (7.19.22), #14 (09.13.22)  - OCT shows OD: Interval development of focal inter retinal cyst nasal fovea, patchy ORA / GA  - BCVA OD decreased to 20/30 from 20/25  - recommend IVA OD #15 today, 10.02.23  - pt wishes to proceed with injection  - RBA of procedure discussed, questions answered - IVA informed consent obtained and signed, 05.08.23 (OU) - see procedure note  - f/u 6-7 weeks DFE, OCT  3,4. Hypertensive retinopathy OU  - discussed importance of tight BP control  - monitor  5. Pseudophakia OU  - s/p CE/IOL OU  - beautiful surgeries, doing well  - monitor  6. Dry eyes OU  - recommend artificial tears and lubricating ointment as needed   Ophthalmic Meds Ordered this visit:  Meds ordered this encounter  Medications   Bevacizumab (AVASTIN) SOLN 1.25 mg   Bevacizumab (AVASTIN) SOLN 1.25 mg     Return for f/u 6-7 weeks, exu ARMD OU, DFE, OCT.  There are no Patient Instructions on file for this visit.  This document serves as a record of services personally performed by Gardiner Sleeper, MD, PhD. It was created on their behalf by Roselee Nova, COMT. The creation of this record is the provider's dictation and/or activities during the visit.  Electronically signed by: Roselee Nova, COMT 05/04/22 2:29 PM  This document serves as a record of services personally performed by Gardiner Sleeper, MD, PhD. It was created on their behalf by San Jetty. Owens Shark, OA an  ophthalmic technician. The creation of this record is the provider's dictation and/or activities during the visit.    Electronically signed by: San Jetty. Owens Shark, [place] 10.020.2023 2:29 PM  Gardiner Sleeper, M.D., Ph.D. Diseases & Surgery of the Retina and Vitreous [facility]  I have reviewed the above documentation for accuracy and completeness, and I agree with the above. Gardiner Sleeper, M.D., Ph.D. 05/04/22 2:29 PM   Abbreviations: M myopia (nearsighted); A astigmatism; H hyperopia (farsighted); P presbyopia; Mrx spectacle prescription;  CTL contact lenses; OD right eye;  OS left eye; OU both eyes  XT exotropia; ET esotropia; PEK punctate epithelial keratitis; PEE punctate epithelial erosions; DES dry eye syndrome; MGD meibomian gland dysfunction; ATs artificial tears; PFAT's preservative free artificial tears; [place] nuclear sclerotic cataract; PSC posterior subcapsular cataract; ERM epi-retinal membrane; PVD posterior vitreous detachment; RD retinal detachment; DM diabetes mellitus; DR diabetic retinopathy; NPDR non-proliferative diabetic retinopathy; PDR proliferative diabetic retinopathy; CSME clinically significant macular edema; DME diabetic macular edema; dbh dot blot hemorrhages; CWS cotton wool spot; POAG primary open angle glaucoma; C/D cup-to-disc ratio; HVF humphrey visual field; GVF goldmann visual field; OCT optical coherence tomography; IOP intraocular pressure; BRVO Branch retinal vein occlusion; CRVO central retinal vein occlusion; CRAO central retinal artery occlusion; BRAO branch retinal artery occlusion; RT retinal tear; SB scleral buckle; PPV pars plana vitrectomy; VH Vitreous hemorrhage; PRP panretinal laser photocoagulation; IVK intravitreal kenalog; VMT vitreomacular traction; MH Macular hole;  NVD neovascularization of the disc; NVE neovascularization elsewhere; AREDS age related eye disease study; ARMD age related macular degeneration; POAG primary open angle  glaucoma; EBMD epithelial/anterior basement membrane dystrophy; ACIOL anterior chamber intraocular lens; IOL intraocular lens; PCIOL posterior chamber intraocular lens; Phaco/IOL phacoemulsification with intraocular lens placement; [place] photorefractive keratectomy; LASIK laser assisted in situ keratomileusis; HTN hypertension; DM diabetes mellitus; COPD chronic obstructive pulmonary disease

## 2022-05-04 ENCOUNTER — Ambulatory Visit (INDEPENDENT_AMBULATORY_CARE_PROVIDER_SITE_OTHER): Payer: Medicare Other | Admitting: Ophthalmology

## 2022-05-04 ENCOUNTER — Encounter: Payer: Medicare Other | Admitting: Physical Therapy

## 2022-05-04 ENCOUNTER — Encounter (INDEPENDENT_AMBULATORY_CARE_PROVIDER_SITE_OTHER): Payer: Self-pay | Admitting: Ophthalmology

## 2022-05-04 DIAGNOSIS — H04123 Dry eye syndrome of bilateral lacrimal glands: Secondary | ICD-10-CM

## 2022-05-04 DIAGNOSIS — H35033 Hypertensive retinopathy, bilateral: Secondary | ICD-10-CM | POA: Diagnosis not present

## 2022-05-04 DIAGNOSIS — I10 Essential (primary) hypertension: Secondary | ICD-10-CM

## 2022-05-04 DIAGNOSIS — H353112 Nonexudative age-related macular degeneration, right eye, intermediate dry stage: Secondary | ICD-10-CM

## 2022-05-04 DIAGNOSIS — Z961 Presence of intraocular lens: Secondary | ICD-10-CM

## 2022-05-04 DIAGNOSIS — H353221 Exudative age-related macular degeneration, left eye, with active choroidal neovascularization: Secondary | ICD-10-CM | POA: Diagnosis not present

## 2022-05-04 MED ORDER — BEVACIZUMAB CHEMO INJECTION 1.25MG/0.05ML SYRINGE FOR KALEIDOSCOPE
1.2500 mg | INTRAVITREAL | Status: AC | PRN
  Administered 2022-05-04: 1.25 mg via INTRAVITREAL

## 2022-05-06 ENCOUNTER — Ambulatory Visit: Payer: Medicare Other | Admitting: Physical Therapy

## 2022-05-06 ENCOUNTER — Encounter: Payer: Self-pay | Admitting: Physical Therapy

## 2022-05-06 DIAGNOSIS — M25562 Pain in left knee: Secondary | ICD-10-CM | POA: Diagnosis not present

## 2022-05-06 DIAGNOSIS — M533 Sacrococcygeal disorders, not elsewhere classified: Secondary | ICD-10-CM | POA: Diagnosis not present

## 2022-05-06 DIAGNOSIS — M6281 Muscle weakness (generalized): Secondary | ICD-10-CM

## 2022-05-06 DIAGNOSIS — G8929 Other chronic pain: Secondary | ICD-10-CM

## 2022-05-06 NOTE — Therapy (Signed)
OUTPATIENT PHYSICAL THERAPY TREATMENT NOTE   Patient Name: Kaitlyn Parks MRN: [id number] DOB:[birth year], 87 y.o., female Today's Date: 05/06/2022   END OF SESSION:   PT End of Session - 05/06/22 1519     Visit Number 15    Number of Visits 24    Date for PT Re-Evaluation 06/28/22    Authorization Type U[employer]    Progress Note Due on Visit 10    PT Start Time 1519    PT Stop Time 4287    PT Time Calculation (min) 38 min    Activity Tolerance Patient tolerated treatment well    Behavior During Therapy WFL for tasks assessed/performed                  Past Medical History:  Diagnosis Date   Arthritis    DDD, scoliosis, sees Dr. Arnoldo Morale for this, uses norco very rarely for pain   Bradycardia 11/11/2017   CAD (coronary artery disease)    LAD stenting of a 90% lesion 2012   Cystocele    Educated about COVID-19 virus infection 12/06/2019   Elevated cholesterol    GERD (gastroesophageal reflux disease)    dx in work up 2016 for atypical CP at OSH   pt. denies   Heart murmur    Hypertensive retinopathy    OU   Hypothyroidism    Interstitial cystitis    sees Dr. Jeffie Pollock   Macular degeneration    OU   NSTEMI (non-ST elevated myocardial infarction) [facility])    Osteoporosis    Rectocele    S/P hip replacement, right 06/12/2017   Scoliosis    Thyroid disease    Hypothyroid   Urinary incontinence    USI   Uterine prolapse    Past Surgical History:  Procedure Laterality Date   BLADDER SUSPENSION  2011   CATARACT EXTRACTION Bilateral 2015   Dr. Herbert Deaner   CORONARY ANGIOPLASTY WITH STENT PLACEMENT  04/21/2010   LAD 80%, RCA 30%, nl EF, s/p DES LAD   EYE SURGERY     LEFT HEART CATH AND CORONARY ANGIOGRAPHY N/A 06/14/2017   Procedure: LEFT HEART CATH AND CORONARY ANGIOGRAPHY;  Surgeon: Lorretta Harp, MD;  Location: [facility];  Service: Cardiovascular;  Laterality: N/A;   OOPHORECTOMY  2011   BSO   TOTAL HIP ARTHROPLASTY Right 06/10/2017   Procedure: RIGHT TOTAL  HIP ARTHROPLASTY ANTERIOR APPROACH;  Surgeon: Rod Can, MD;  Location: [facility];  Service: Orthopedics;  Laterality: Right;  Needs RNFA   VAGINAL HYSTERECTOMY  2011   LAVH BSO; benign   Patient Active Problem List   Diagnosis Date Noted   Aortic atherosclerosis ([place]) 12/07/2019   Macular degeneration 01/11/2019   Coronary artery disease involving native coronary artery of native heart without angina pectoris 10/29/2018   Presence of intraocular lens 09/23/2018   Bradycardia 11/11/2017   Dyslipidemia 11/11/2017   Pain in joint of right hip 08/04/2017   CAD (coronary artery disease) 06/12/2017   GERD (gastroesophageal reflux disease) 06/12/2017   S/P hip replacement, right 06/12/2017   Osteoarthritis of right hip 06/10/2017   CAD S/P percutaneous coronary angioplasty 02/11/2015   Renal insufficiency 02/11/2015   Prolonged Q-T interval on ECG 04/18/2010   Hypertension 08/11/2007   Hypothyroidism (acquired) 06/08/2007   MITRAL VALVE PROLAPSE 06/08/2007   PCP: [name], MD   REFERRING PROVIDER: Reece Agar   REFERRING DIAG: Sacroiliitis M46.1   Rationale for Evaluation and Treatment Rehabilitation   THERAPY DIAG:  Sacroiliac joint dysfunction   Muscle weakness (generalized)   Chronic pain of left knee   ONSET DATE: 4 years    SUBJECTIVE:                                                                                                                                                                                            SUBJECTIVE STATEMENT:  .05/06/2022 States she is sore and stiff today. She did her exercises and it was doing well but she has had a busy day.  Eval: Patient complains of chronic left SIJ pain that has been managed with quarterly injections. Reports she also has left knee pain that wakes her up at night and this is her biggest complaint. States that she also uses heat and ice and this helps. She is active in water classes Tues/Thurs in the AM  and would liek to go back to yoga.   PERTINENT HISTORY:  Scoliosis, R THA 5 years ago, history of cardiac issues   PAIN:  Are you having pain? Yes: NPRS scale: 2/10 in the knee-  Pain location: left knee pain Pain description: throbbing Aggravating factors: aggressive aquatic routine, long distance walking, grocery store Relieving factors: ice, rest     PRECAUTIONS: None   WEIGHT BEARING RESTRICTIONS No   FALLS:  Has patient fallen in last 6 months? No   LIVING ENVIRONMENT: Lives with: lives alone Lives in: House/apartment     OCCUPATION: not working, likes to carden in her potted plants    PLOF: Independent   PATIENT GOALS to not have left knee pain. To go back to yoga (stopped 7 years ago)     OBJECTIVE:    DIAGNOSTIC FINDINGS:  None recent at [facility]     SCREENING FOR RED FLAGS: Bowel or bladder incontinence: No Spinal tumors: No Cauda equina syndrome: No Compression fracture: No Abdominal aneurysm: No   COGNITION:           Overall cognitive status: Within functional limits for tasks assessed                              MUSCLE LENGTH: Hamstrings: Right 30 deg; Left 30 deg     POSTURE: S curve in spine, right hip higher than left, forward head.    PALPATION: Tenderness to palpation along glute meds and lumbar paraspinals              LE Measurements         Lower Extremity Right 03/03/2022 Left 03/03/2022 R/L 09/14     A/PROM MMT A/PROM MMT A/PROM  MMT  Hip Flexion 105 4 90 4- /110 4/4  Hip Extension            Hip Abduction            Hip Adduction            Hip Internal rotation 45   5*   30/ 10*   Hip External rotation 40   10   32/ 35   Knee Flexion 120 4 120* 4- 120/ 120 4/4  Knee Extension 0 4 8 lacking extension 4- 0/ Lacking 3 4/4  Ankle Dorsiflexion            Ankle Plantarflexion            Ankle Inversion            Ankle Eversion             (Blank rows = not tested)            * pain     LUMBAR SPECIAL TESTS:  Positive ely's  test on left    TODAY'S TREATMENT  05/06/2022 TE:  Supine:on ball   SAQs on ball 3x10 B, DKC to tolerance 1 minute x2 on ball, hip add isometrics ball 2 minutes 5" holds, hip abd into band 5" holds 2 minutes, bent knee fall outs 2 minutes, bent knee fall ins 2 minutes, hip add iso x30 5" holds B, bridges with hip add 2x15  Standing: glutes squeezes x25 5" holds Manual: tibial bouncing 5 minutes B, STM  L LE and groin - tolerated well    PATIENT EDUCATION:  Education details: on how to safely use own theragun and using cushion when always using theragun. Person educated: Patient Education method: Explanation, Demonstration, and Handouts Education comprehension: verbalized understanding   HOME EXERCISE PROGRAM: [id number]   ASSESSMENT:   CLINICAL IMPRESSION: 05/06/2022 Continued to focus on mobility and strengthening. Limited hip IR and extension on left but poor tolerance to stretching or interventions in these motions. Mild soreness noted end of session. Patient reported she would use ice at home to help with soreness. Will continue with current POC as tolerated.   Eval: Patient complains of left SIJ and left knee pain that started years ago. SIJ pain is managed by injections and this will also help with the knee pain. Patient limited in left hip ROM in all directions with pain in knee presenting with left hip IR. Educated patient on findings and POC moving forward. Patient would greatly benefit from skilled PT to improve overall function and QOL.      OBJECTIVE IMPAIRMENTS Abnormal gait, decreased activity tolerance, decreased balance, decreased mobility, difficulty walking, decreased ROM, decreased strength, postural dysfunction, and pain.    ACTIVITY LIMITATIONS sleeping and locomotion level   PARTICIPATION LIMITATIONS: shopping, community activity, and yard work   PERSONAL FACTORS Age and 1-2 comorbidities: chronic left SIJ pain, R THA   are also affecting patient's functional  outcome.    REHAB POTENTIAL: Good   CLINICAL DECISION MAKING: Stable/uncomplicated   EVALUATION COMPLEXITY: Low   GOALS: Goals reviewed with patient?  yes   SHORT TERM GOALS:   Patient will be independent in self management strategies to improve quality of life and functional outcomes. Baseline: new program Target date: 04/07/2022 Goal status: MET    2.  Patient will report at least 50% improvement in overall symptoms and/or function to demonstrate improved functional mobility Baseline: 0% Target date: 04/07/2022 Goal status: MET    3.  Patient will be able to report sleeping through the night without waking up secondary to pain in her left knee to improve sleep quality. Baseline: painful Target date: 05/07/2022 Goal status: ONGOING, Pt intermittently has been taking pain meds to sleep through the night. She says she only takes pain meds when she does water aerobics.      LONG TERM GOALS:   Patient will report at least 75% improvement in overall symptoms and/or function to demonstrate improved functional mobility Baseline: 0% Target date: 05/28/2022 Goal status: 40% improvements per pt.    2.  Patient will demonstrate at least 15 degrees of left hip IR and ER to improve ROM in hip  Baseline: see above Target date: 05/28/2022 Goal status: 10 degrees internal rotation on L LE.    3.  Patient will report ability to perform more LE exercises while in aquatics to improve strength and mobility. Baseline: unable Target date: 05/12/2022 Goal status: MET   PLAN: PT FREQUENCY: 2x/week    PT DURATION: 6 weeks   PLANNED INTERVENTIONS: Therapeutic exercises, Therapeutic activity, Neuromuscular re-education, Balance training, Gait training, Patient/Family education, Self Care, Joint mobilization, Joint manipulation, Orthotic/Fit training, DME instructions, Aquatic Therapy, Dry Needling, Electrical stimulation, Spinal manipulation, Spinal mobilization, Cryotherapy, Moist heat,  Ionotophoresis 1m/ml Dexamethasone, and Manual therapy.   PLAN FOR NEXT SESSION: patient likes some form of manual (gentle STM, tibial bouncing) then exercises, gentle knee ext,ankle and hip mobility L.; left hip STM, mobilizations, long axis traction, hip IR/ER Ext/flexion, isometrics and strengthening   4:01 PM, 05/06/22 MJerene Pitch DPT Physical Therapy with [facility]

## 2022-05-11 ENCOUNTER — Encounter: Payer: Self-pay | Admitting: Physical Therapy

## 2022-05-11 ENCOUNTER — Ambulatory Visit: Payer: Medicare Other | Admitting: Physical Therapy

## 2022-05-11 DIAGNOSIS — M25562 Pain in left knee: Secondary | ICD-10-CM | POA: Diagnosis not present

## 2022-05-11 DIAGNOSIS — M533 Sacrococcygeal disorders, not elsewhere classified: Secondary | ICD-10-CM | POA: Diagnosis not present

## 2022-05-11 DIAGNOSIS — G8929 Other chronic pain: Secondary | ICD-10-CM

## 2022-05-11 DIAGNOSIS — M6281 Muscle weakness (generalized): Secondary | ICD-10-CM | POA: Diagnosis not present

## 2022-05-11 NOTE — Therapy (Unsigned)
OUTPATIENT PHYSICAL THERAPY TREATMENT NOTE   Patient Name: Kaitlyn Parks MRN: [id number] DOB:1934-08-04, 87 y.o., female Today's Date: 05/11/2022   END OF SESSION:   PT End of Session - 05/11/22 1515     Visit Number 16    Number of Visits 24    Date for PT Re-Evaluation 06/28/22    Authorization Type UHC    Progress Note Due on Visit 10    PT Start Time 1516    PT Stop Time 2637    PT Time Calculation (min) 39 min    Activity Tolerance Patient tolerated treatment well    Behavior During Therapy WFL for tasks assessed/performed                  Past Medical History:  Diagnosis Date   Arthritis    DDD, scoliosis, sees Dr. Arnoldo Morale for this, uses norco very rarely for pain   Bradycardia 11/11/2017   CAD (coronary artery disease)    LAD stenting of a 90% lesion 2012   Cystocele    Educated about COVID-19 virus infection 12/06/2019   Elevated cholesterol    GERD (gastroesophageal reflux disease)    dx in work up 2016 for atypical CP at OSH   pt. denies   Heart murmur    Hypertensive retinopathy    OU   Hypothyroidism    Interstitial cystitis    sees Dr. Jeffie Pollock   Macular degeneration    OU   NSTEMI (non-ST elevated myocardial infarction) [facility])    Osteoporosis    Rectocele    S/P hip replacement, right 06/12/2017   Scoliosis    Thyroid disease    Hypothyroid   Urinary incontinence    USI   Uterine prolapse    Past Surgical History:  Procedure Laterality Date   BLADDER SUSPENSION  2011   CATARACT EXTRACTION Bilateral 2015   Dr. Herbert Deaner   CORONARY ANGIOPLASTY WITH STENT PLACEMENT  04/21/2010   LAD 80%, RCA 30%, nl EF, s/p DES LAD   EYE SURGERY     LEFT HEART CATH AND CORONARY ANGIOGRAPHY N/A 06/14/2017   Procedure: LEFT HEART CATH AND CORONARY ANGIOGRAPHY;  Surgeon: Lorretta Harp, MD;  Location: [facility];  Service: Cardiovascular;  Laterality: N/A;   OOPHORECTOMY  2011   BSO   TOTAL HIP ARTHROPLASTY Right 06/10/2017   Procedure: RIGHT TOTAL  HIP ARTHROPLASTY ANTERIOR APPROACH;  Surgeon: Rod Can, MD;  Location: [facility];  Service: Orthopedics;  Laterality: Right;  Needs RNFA   VAGINAL HYSTERECTOMY  2011   LAVH BSO; benign   Patient Active Problem List   Diagnosis Date Noted   Aortic atherosclerosis ([place]) 12/07/2019   Macular degeneration 01/11/2019   Coronary artery disease involving native coronary artery of native heart without angina pectoris 10/29/2018   Presence of intraocular lens 09/23/2018   Bradycardia 11/11/2017   Dyslipidemia 11/11/2017   Pain in joint of right hip 08/04/2017   CAD (coronary artery disease) 06/12/2017   GERD (gastroesophageal reflux disease) 06/12/2017   S/P hip replacement, right 06/12/2017   Osteoarthritis of right hip 06/10/2017   CAD S/P percutaneous coronary angioplasty 02/11/2015   Renal insufficiency 02/11/2015   Prolonged Q-T interval on ECG 04/18/2010   Hypertension 08/11/2007   Hypothyroidism (acquired) 06/08/2007   MITRAL VALVE PROLAPSE 06/08/2007   PCP: [name], MD   REFERRING PROVIDER: Reece Agar   REFERRING DIAG: Sacroiliitis M46.1   Rationale for Evaluation and Treatment Rehabilitation   THERAPY DIAG:  Sacroiliac joint dysfunction   Muscle weakness (generalized)   Chronic pain of left knee   ONSET DATE: 4 years    SUBJECTIVE:                                                                                                                                                                                            SUBJECTIVE STATEMENT:  .05/11/2022 States her back is feeling good but her knee is bothering her right below the knee and on the outside area. States that when she turns foot in her knee hurts more and her knee hurts less when she turns her foot out.  Eval: Patient complains of chronic left SIJ pain that has been managed with quarterly injections. Reports she also has left knee pain that wakes her up at night and this is her biggest  complaint. States that she also uses heat and ice and this helps. She is active in water classes Tues/Thurs in the AM and would liek to go back to yoga.   PERTINENT HISTORY:  Scoliosis, R THA 5 years ago, history of cardiac issues   PAIN:  Are you having pain? Yes: NPRS scale: 5/10 in the knee-  Pain location: left knee pain Pain description: throbbing Aggravating factors: aggressive aquatic routine, long distance walking, grocery store Relieving factors: ice, rest     PRECAUTIONS: None   WEIGHT BEARING RESTRICTIONS No   FALLS:  Has patient fallen in last 6 months? No   LIVING ENVIRONMENT: Lives with: lives alone Lives in: House/apartment     OCCUPATION: not working, likes to carden in her potted plants    PLOF: Independent   PATIENT GOALS to not have left knee pain. To go back to yoga (stopped 7 years ago)     OBJECTIVE:    DIAGNOSTIC FINDINGS:  None recent at [facility]     SCREENING FOR RED FLAGS: Bowel or bladder incontinence: No Spinal tumors: No Cauda equina syndrome: No Compression fracture: No Abdominal aneurysm: No   COGNITION:           Overall cognitive status: Within functional limits for tasks assessed                              MUSCLE LENGTH: Hamstrings: Right 30 deg; Left 30 deg     POSTURE: S curve in spine, right hip higher than left, forward head.    PALPATION: Tenderness to palpation along glute meds and lumbar paraspinals              LE Measurements  Lower Extremity Right 03/03/2022 Left 03/03/2022 R/L 09/14     A/PROM MMT A/PROM MMT A/PROM MMT  Hip Flexion 105 4 90 4- /110 4/4  Hip Extension            Hip Abduction            Hip Adduction            Hip Internal rotation 45   5*   30/ 10*   Hip External rotation 40   10   32/ 35   Knee Flexion 120 4 120* 4- 120/ 120 4/4  Knee Extension 0 4 8 lacking extension 4- 0/ Lacking 3 4/4  Ankle Dorsiflexion            Ankle Plantarflexion            Ankle Inversion             Ankle Eversion             (Blank rows = not tested)            * pain     LUMBAR SPECIAL TESTS:  Positive ely's test on left    TODAY'S TREATMENT  05/11/2022 TE:  Supine:on ball   SAQs on ball 3x10 B, hip abd into band 5" holds 2 minutes, bent knee fall outs 2 minutes, bent knee fall ins 2 minutes, hip add iso x30 5" holds B,  Manual: tibial bouncing 3 minutes B, STM  L LE and groin - tolerated well    PATIENT EDUCATION:  Education details: on functional vs anatomical leg lenght Person educated: Patient Education method: Explanation, Demonstration, and Handouts Education comprehension: verbalized understanding   HOME EXERCISE PROGRAM: [id number]   ASSESSMENT:   CLINICAL IMPRESSION: 05/11/2022 ***  Eval: Patient complains of left SIJ and left knee pain that started years ago. SIJ pain is managed by injections and this will also help with the knee pain. Patient limited in left hip ROM in all directions with pain in knee presenting with left hip IR. Educated patient on findings and POC moving forward. Patient would greatly benefit from skilled PT to improve overall function and QOL.      OBJECTIVE IMPAIRMENTS Abnormal gait, decreased activity tolerance, decreased balance, decreased mobility, difficulty walking, decreased ROM, decreased strength, postural dysfunction, and pain.    ACTIVITY LIMITATIONS sleeping and locomotion level   PARTICIPATION LIMITATIONS: shopping, community activity, and yard work   PERSONAL FACTORS Age and 1-2 comorbidities: chronic left SIJ pain, R THA   are also affecting patient's functional outcome.    REHAB POTENTIAL: Good   CLINICAL DECISION MAKING: Stable/uncomplicated   EVALUATION COMPLEXITY: Low   GOALS: Goals reviewed with patient?  yes   SHORT TERM GOALS:   Patient will be independent in self management strategies to improve quality of life and functional outcomes. Baseline: new program Target date: 04/07/2022 Goal status: MET    2.   Patient will report at least 50% improvement in overall symptoms and/or function to demonstrate improved functional mobility Baseline: 0% Target date: 04/07/2022 Goal status: MET    3.  Patient will be able to report sleeping through the night without waking up secondary to pain in her left knee to improve sleep quality. Baseline: painful Target date: 05/07/2022 Goal status: ONGOING, Pt intermittently has been taking pain meds to sleep through the night. She says she only takes pain meds when she does water aerobics.      LONG TERM GOALS:  Patient will report at least 75% improvement in overall symptoms and/or function to demonstrate improved functional mobility Baseline: 0% Target date: 05/28/2022 Goal status: 40% improvements per pt.    2.  Patient will demonstrate at least 15 degrees of left hip IR and ER to improve ROM in hip  Baseline: see above Target date: 05/28/2022 Goal status: 10 degrees internal rotation on L LE.    3.  Patient will report ability to perform more LE exercises while in aquatics to improve strength and mobility. Baseline: unable Target date: 05/12/2022 Goal status: MET   PLAN: PT FREQUENCY: 2x/week    PT DURATION: 6 weeks   PLANNED INTERVENTIONS: Therapeutic exercises, Therapeutic activity, Neuromuscular re-education, Balance training, Gait training, Patient/Family education, Self Care, Joint mobilization, Joint manipulation, Orthotic/Fit training, DME instructions, Aquatic Therapy, Dry Needling, Electrical stimulation, Spinal manipulation, Spinal mobilization, Cryotherapy, Moist heat, Ionotophoresis 4mg /ml Dexamethasone, and Manual therapy.   PLAN FOR NEXT SESSION: patient likes some form of manual (gentle STM, tibial bouncing) then exercises, gentle knee ext,ankle and hip mobility L.; left hip STM, mobilizations, long axis traction, hip IR/ER Ext/flexion, isometrics and strengthening   3:20 PM, 05/11/22 Jerene Pitch, DPT Physical Therapy with  [facility]

## 2022-05-12 NOTE — Addendum Note (Signed)
Addended by: Jerene Pitch R on: 05/12/2022 12:14 PM   Modules accepted: Orders

## 2022-05-13 ENCOUNTER — Ambulatory Visit: Payer: Medicare Other | Admitting: Physical Therapy

## 2022-05-13 ENCOUNTER — Encounter: Payer: Self-pay | Admitting: Physical Therapy

## 2022-05-13 DIAGNOSIS — M533 Sacrococcygeal disorders, not elsewhere classified: Secondary | ICD-10-CM

## 2022-05-13 DIAGNOSIS — G8929 Other chronic pain: Secondary | ICD-10-CM | POA: Diagnosis not present

## 2022-05-13 DIAGNOSIS — M25562 Pain in left knee: Secondary | ICD-10-CM

## 2022-05-13 DIAGNOSIS — M6281 Muscle weakness (generalized): Secondary | ICD-10-CM | POA: Diagnosis not present

## 2022-05-13 NOTE — Therapy (Signed)
OUTPATIENT PHYSICAL THERAPY TREATMENT NOTE   Patient Name: Kaitlyn Parks MRN: [id number] DOB:1935/07/31, 87 y.o., female Today's Date: 05/13/2022   END OF SESSION:   PT End of Session - 05/13/22 1516     Visit Number 17    Number of Visits 24    Date for PT Re-Evaluation 05/28/22    Authorization Type UHC    Progress Note Due on Visit 10    PT Start Time 1519    PT Stop Time 0100    PT Time Calculation (min) 38 min    Activity Tolerance Patient tolerated treatment well    Behavior During Therapy WFL for tasks assessed/performed                  Past Medical History:  Diagnosis Date   Arthritis    DDD, scoliosis, sees Dr. Arnoldo Morale for this, uses norco very rarely for pain   Bradycardia 11/11/2017   CAD (coronary artery disease)    LAD stenting of a 90% lesion 2012   Cystocele    Educated about COVID-19 virus infection 12/06/2019   Elevated cholesterol    GERD (gastroesophageal reflux disease)    dx in work up 2016 for atypical CP at OSH   pt. denies   Heart murmur    Hypertensive retinopathy    OU   Hypothyroidism    Interstitial cystitis    sees Dr. Jeffie Pollock   Macular degeneration    OU   NSTEMI (non-ST elevated myocardial infarction) [facility])    Osteoporosis    Rectocele    S/P hip replacement, right 06/12/2017   Scoliosis    Thyroid disease    Hypothyroid   Urinary incontinence    USI   Uterine prolapse    Past Surgical History:  Procedure Laterality Date   BLADDER SUSPENSION  2011   CATARACT EXTRACTION Bilateral 2015   Dr. Herbert Deaner   CORONARY ANGIOPLASTY WITH STENT PLACEMENT  04/21/2010   LAD 80%, RCA 30%, nl EF, s/p DES LAD   EYE SURGERY     LEFT HEART CATH AND CORONARY ANGIOGRAPHY N/A 06/14/2017   Procedure: LEFT HEART CATH AND CORONARY ANGIOGRAPHY;  Surgeon: Lorretta Harp, MD;  Location: [facility];  Service: Cardiovascular;  Laterality: N/A;   OOPHORECTOMY  2011   BSO   TOTAL HIP ARTHROPLASTY Right 06/10/2017   Procedure: RIGHT  TOTAL HIP ARTHROPLASTY ANTERIOR APPROACH;  Surgeon: Rod Can, MD;  Location: [facility];  Service: Orthopedics;  Laterality: Right;  Needs RNFA   VAGINAL HYSTERECTOMY  2011   LAVH BSO; benign   Patient Active Problem List   Diagnosis Date Noted   Aortic atherosclerosis ([place]) 12/07/2019   Macular degeneration 01/11/2019   Coronary artery disease involving native coronary artery of native heart without angina pectoris 10/29/2018   Presence of intraocular lens 09/23/2018   Bradycardia 11/11/2017   Dyslipidemia 11/11/2017   Pain in joint of right hip 08/04/2017   CAD (coronary artery disease) 06/12/2017   GERD (gastroesophageal reflux disease) 06/12/2017   S/P hip replacement, right 06/12/2017   Osteoarthritis of right hip 06/10/2017   CAD S/P percutaneous coronary angioplasty 02/11/2015   Renal insufficiency 02/11/2015   Prolonged Q-T interval on ECG 04/18/2010   Hypertension 08/11/2007   Hypothyroidism (acquired) 06/08/2007   MITRAL VALVE PROLAPSE 06/08/2007   PCP: [name], MD   REFERRING PROVIDER: Reece Agar   REFERRING DIAG: Sacroiliitis M46.1   Rationale for Evaluation and Treatment Rehabilitation   THERAPY DIAG:  Sacroiliac joint dysfunction   Muscle weakness (generalized)   Chronic pain of left knee   ONSET DATE: 4 years    SUBJECTIVE:                                                                                                                                                                                            SUBJECTIVE STATEMENT:  .05/13/2022 States she did her exercises while waiting for her friend at the hospital. Reports she feels 70% better  Eval: Patient complains of chronic left SIJ pain that has been managed with quarterly injections. Reports she also has left knee pain that wakes her up at night and this is her biggest complaint. States that she also uses heat and ice and this helps. She is active in water classes Tues/Thurs in the  AM and would liek to go back to yoga.   PERTINENT HISTORY:  Scoliosis, R THA 5 years ago, history of cardiac issues   PAIN:  Are you having pain? Yes: NPRS scale: 5/10 in the knee-  Pain location: left knee pain Pain description: throbbing Aggravating factors: aggressive aquatic routine, long distance walking, grocery store Relieving factors: ice, rest     PRECAUTIONS: None   WEIGHT BEARING RESTRICTIONS No   FALLS:  Has patient fallen in last 6 months? No   LIVING ENVIRONMENT: Lives with: lives alone Lives in: House/apartment     OCCUPATION: not working, likes to carden in her potted plants    PLOF: Independent   PATIENT GOALS to not have left knee pain. To go back to yoga (stopped 7 years ago)     OBJECTIVE:    DIAGNOSTIC FINDINGS:  None recent at [facility]     SCREENING FOR RED FLAGS: Bowel or bladder incontinence: No Spinal tumors: No Cauda equina syndrome: No Compression fracture: No Abdominal aneurysm: No   COGNITION:           Overall cognitive status: Within functional limits for tasks assessed                              MUSCLE LENGTH: Hamstrings: Right 30 deg; Left 30 deg     POSTURE: S curve in spine, right hip higher than left, forward head.    PALPATION: Tenderness to palpation along glute meds and lumbar paraspinals              LE Measurements         Lower Extremity Right 03/03/2022 Left 03/03/2022 R/L 09/14     A/PROM MMT A/PROM MMT A/PROM MMT  Hip Flexion 105  4 90 4- 120/110 4/4  Hip Extension            Hip Abduction            Hip Adduction            Hip Internal rotation 45   5*   30/ 5   Hip External rotation 40   10   45/ 35   Knee Flexion 120 4 120* 4- 120/ 120 4/4  Knee Extension 0 4 8 lacking extension 4- 0/ Lacking 3 4/4  Ankle Dorsiflexion            Ankle Plantarflexion            Ankle Inversion            Ankle Eversion             (Blank rows = not tested)            * pain     LUMBAR SPECIAL TESTS:  Positive  ely's test on left    TODAY'S TREATMENT  05/13/2022 TE:  Supine: hamstring stretch x3 30" holds with strap, gastroc stretch x3 30" , SAQS on ball x2 1.5 minutes, hamstring iso on ball 2 minutes, hip abd fall outs 2 minutes, PROM left hip in all directions,     Manual: tibial bouncing 6 minutes B, STM  L LE and groin - tolerated well    PATIENT EDUCATION:  Education details: on HEP, on current findings, on walking program Person educated: Patient Education method: Explanation, Demonstration, and Handouts Education comprehension: verbalized understanding   HOME EXERCISE PROGRAM: [id number]   ASSESSMENT:   CLINICAL IMPRESSION: 05/13/2022  Patient improving in overall function but continues to lack hip ROM. Educated patient on this and how this relates to her knee and SIJ pain. Will continue with current POC as tolerated no pain just soreness noted end of session. Eval: Patient complains of left SIJ and left knee pain that started years ago. SIJ pain is managed by injections and this will also help with the knee pain. Patient limited in left hip ROM in all directions with pain in knee presenting with left hip IR. Educated patient on findings and POC moving forward. Patient would greatly benefit from skilled PT to improve overall function and QOL.      OBJECTIVE IMPAIRMENTS Abnormal gait, decreased activity tolerance, decreased balance, decreased mobility, difficulty walking, decreased ROM, decreased strength, postural dysfunction, and pain.    ACTIVITY LIMITATIONS sleeping and locomotion level   PARTICIPATION LIMITATIONS: shopping, community activity, and yard work   PERSONAL FACTORS Age and 1-2 comorbidities: chronic left SIJ pain, R THA   are also affecting patient's functional outcome.    REHAB POTENTIAL: Good   CLINICAL DECISION MAKING: Stable/uncomplicated   EVALUATION COMPLEXITY: Low   GOALS: Goals reviewed with patient?  yes   SHORT TERM GOALS:   Patient will be  independent in self management strategies to improve quality of life and functional outcomes. Baseline: new program Target date: 04/07/2022 Goal status: MET    2.  Patient will report at least 50% improvement in overall symptoms and/or function to demonstrate improved functional mobility Baseline: 0% Target date: 04/07/2022 Goal status: MET    3.  Patient will be able to report sleeping through the night without waking up secondary to pain in her left knee to improve sleep quality. Baseline: painful Target date: 05/07/2022 Goal status: ONGOING, Pt intermittently has been taking pain meds to sleep through the  night. She says she only takes pain meds when she does water aerobics.      LONG TERM GOALS:   Patient will report at least 75% improvement in overall symptoms and/or function to demonstrate improved functional mobility Baseline: 0% Target date: 05/28/2022 Goal status: 40% improvements per pt.    2.  Patient will demonstrate at least 15 degrees of left hip IR and ER to improve ROM in hip  Baseline: see above Target date: 05/28/2022 Goal status: 10 degrees internal rotation on L LE.    3.  Patient will report ability to perform more LE exercises while in aquatics to improve strength and mobility. Baseline: unable Target date: 05/12/2022 Goal status: MET   PLAN: PT FREQUENCY: 2x/week    PT DURATION: 6 weeks   PLANNED INTERVENTIONS: Therapeutic exercises, Therapeutic activity, Neuromuscular re-education, Balance training, Gait training, Patient/Family education, Self Care, Joint mobilization, Joint manipulation, Orthotic/Fit training, DME instructions, Aquatic Therapy, Dry Needling, Electrical stimulation, Spinal manipulation, Spinal mobilization, Cryotherapy, Moist heat, Ionotophoresis 4mg /ml Dexamethasone, and Manual therapy.   PLAN FOR NEXT SESSION: patient likes some form of manual (gentle STM, tibial bouncing) then exercises, gentle knee ext,ankle and hip mobility L.; left  hip STM, mobilizations, long axis traction, hip IR/ER Ext/flexion, isometrics and strengthening   4:24 PM, 05/13/22 Jerene Pitch, DPT Physical Therapy with [facility]

## 2022-05-18 ENCOUNTER — Encounter: Payer: Medicare Other | Admitting: Physical Therapy

## 2022-05-18 ENCOUNTER — Encounter: Payer: Self-pay | Admitting: Physical Therapy

## 2022-05-18 ENCOUNTER — Ambulatory Visit: Payer: Medicare Other | Admitting: Physical Therapy

## 2022-05-18 DIAGNOSIS — M533 Sacrococcygeal disorders, not elsewhere classified: Secondary | ICD-10-CM | POA: Diagnosis not present

## 2022-05-18 DIAGNOSIS — M6281 Muscle weakness (generalized): Secondary | ICD-10-CM | POA: Diagnosis not present

## 2022-05-18 DIAGNOSIS — M25562 Pain in left knee: Secondary | ICD-10-CM

## 2022-05-18 DIAGNOSIS — G8929 Other chronic pain: Secondary | ICD-10-CM | POA: Diagnosis not present

## 2022-05-18 NOTE — Therapy (Signed)
OUTPATIENT PHYSICAL THERAPY TREATMENT NOTE   Patient Name: Kaitlyn Parks MRN: [id number] DOB:September 24, 1934, 87 y.o., female Today's Date: 05/18/2022   END OF SESSION:   PT End of Session - 05/18/22 1349     Visit Number 18    Number of Visits 24    Date for PT Re-Evaluation 05/28/22    Authorization Type UHC    PT Start Time 1349    PT Stop Time 1427    PT Time Calculation (min) 38 min    Activity Tolerance Patient tolerated treatment well    Behavior During Therapy WFL for tasks assessed/performed                  Past Medical History:  Diagnosis Date   Arthritis    DDD, scoliosis, sees Dr. Lovell Sheehan for this, uses norco very rarely for pain   Bradycardia 11/11/2017   CAD (coronary artery disease)    LAD stenting of a 90% lesion 2012   Cystocele    Educated about COVID-19 virus infection 12/06/2019   Elevated cholesterol    GERD (gastroesophageal reflux disease)    dx in work up 2016 for atypical CP at OSH   pt. denies   Heart murmur    Hypertensive retinopathy    OU   Hypothyroidism    Interstitial cystitis    sees Dr. [name]   Macular degeneration    OU   NSTEMI (non-ST elevated myocardial infarction) [facility])    Osteoporosis    Rectocele    S/P hip replacement, right 06/12/2017   Scoliosis    Thyroid disease    Hypothyroid   Urinary incontinence    USI   Uterine prolapse    Past Surgical History:  Procedure Laterality Date   BLADDER SUSPENSION  2011   CATARACT EXTRACTION Bilateral 2015   Dr. Elmer Picker   CORONARY ANGIOPLASTY WITH STENT PLACEMENT  04/21/2010   LAD 80%, RCA 30%, nl EF, s/p DES LAD   EYE SURGERY     LEFT HEART CATH AND CORONARY ANGIOGRAPHY N/A 06/14/2017   Procedure: LEFT HEART CATH AND CORONARY ANGIOGRAPHY;  Surgeon: Runell Gess, MD;  Location: [facility];  Service: Cardiovascular;  Laterality: N/A;   OOPHORECTOMY  2011   BSO   TOTAL HIP ARTHROPLASTY Right 06/10/2017   Procedure: RIGHT TOTAL HIP ARTHROPLASTY ANTERIOR  APPROACH;  Surgeon: Samson Frederic, MD;  Location: [facility];  Service: Orthopedics;  Laterality: Right;  Needs RNFA   VAGINAL HYSTERECTOMY  2011   LAVH BSO; benign   Patient Active Problem List   Diagnosis Date Noted   Aortic atherosclerosis ([facility]) 12/07/2019   Macular degeneration 01/11/2019   Coronary artery disease involving native coronary artery of native heart without angina pectoris 10/29/2018   Presence of intraocular lens 09/23/2018   Bradycardia 11/11/2017   Dyslipidemia 11/11/2017   Pain in joint of right hip 08/04/2017   CAD (coronary artery disease) 06/12/2017   GERD (gastroesophageal reflux disease) 06/12/2017   S/P hip replacement, right 06/12/2017   Osteoarthritis of right hip 06/10/2017   CAD S/P percutaneous coronary angioplasty 02/11/2015   Renal insufficiency 02/11/2015   Prolonged Q-T interval on ECG 04/18/2010   Hypertension 08/11/2007   Hypothyroidism (acquired) 06/08/2007   MITRAL VALVE PROLAPSE 06/08/2007   PCP: Theodis Shove, MD   REFERRING PROVIDER: Renaldo Fiddler   REFERRING DIAG: Sacroiliitis M46.1   Rationale for Evaluation and Treatment Rehabilitation   THERAPY DIAG:  Sacroiliac joint dysfunction   Muscle weakness (generalized)  Chronic pain of left knee   ONSET DATE: 4 years    SUBJECTIVE:                                                                                                                                                                                            SUBJECTIVE STATEMENT:  05/18/2022 States been the exercises and she feels good. States her knee is getting better at times.   Eval: Patient complains of chronic left SIJ pain that has been managed with quarterly injections. Reports she also has left knee pain that wakes her up at night and this is her biggest complaint. States that she also uses heat and ice and this helps. She is active in water classes Tues/Thurs in the AM and would liek to go back to yoga.    PERTINENT HISTORY:  Scoliosis, R THA 5 years ago, history of cardiac issues   PAIN:  Are you having pain? Yes: NPRS scale: 5/10 in the knee-  Pain location: left knee pain Pain description: throbbing Aggravating factors: aggressive aquatic routine, long distance walking, grocery store Relieving factors: ice, rest     PRECAUTIONS: None   WEIGHT BEARING RESTRICTIONS No   FALLS:  Has patient fallen in last 6 months? No   LIVING ENVIRONMENT: Lives with: lives alone Lives in: House/apartment     OCCUPATION: not working, likes to carden in her potted plants    PLOF: Independent   PATIENT GOALS to not have left knee pain. To go back to yoga (stopped 7 years ago)     OBJECTIVE:    DIAGNOSTIC FINDINGS:  None recent at [facility]     SCREENING FOR RED FLAGS: Bowel or bladder incontinence: No Spinal tumors: No Cauda equina syndrome: No Compression fracture: No Abdominal aneurysm: No   COGNITION:           Overall cognitive status: Within functional limits for tasks assessed                              MUSCLE LENGTH: Hamstrings: Right 30 deg; Left 30 deg     POSTURE: S curve in spine, right hip higher than left, forward head.    PALPATION: Tenderness to palpation along glute meds and lumbar paraspinals              LE Measurements         Lower Extremity Right 03/03/2022 Left 03/03/2022 R/L 09/14     A/PROM MMT A/PROM MMT A/PROM MMT  Hip Flexion 105 4 90 4- 120/110 4/4  Hip Extension  Hip Abduction            Hip Adduction            Hip Internal rotation 45   5*   30/ 5   Hip External rotation 40   10   45/ 35   Knee Flexion 120 4 120* 4- 120/ 120 4/4  Knee Extension 0 4 8 lacking extension 4- 0/ Lacking 3 4/4  Ankle Dorsiflexion            Ankle Plantarflexion            Ankle Inversion            Ankle Eversion             (Blank rows = not tested)            * pain     LUMBAR SPECIAL TESTS:  Positive ely's test on left    TODAY'S TREATMENT   05/18/2022 TE:  Supine  Seated: DF with ball between knees 2x15 B, heel raise with ball between knees, 2x15 B one at a time, ball roll in/out under foot x2 1.5 minutes bilateral, Laqs with ball b/w knees 3x10 B, piriformis stretch x3 30" holds B, hip add iso ball 2 minutes total 5" holds, hamstring stretch x3 30" holds  Manual:    PATIENT EDUCATION:  Education details: on HEP Person educated: Patient Education method: Consulting civil engineer, Media planner, and Handouts Education comprehension: verbalized understanding   HOME EXERCISE PROGRAM: [id number]   ASSESSMENT:   CLINICAL IMPRESSION: 05/18/2022  Session focused on left knee/leg exercises in seated position which were tolerated well. Used ball to help control excessive hip motion for ankle movements. Favors eversion with dorsiflexion but this improved with tactile and verbal cues. Slight tightness in SIJ noted end of session but no pain in knee or SIJ.  Eval: Patient complains of left SIJ and left knee pain that started years ago. SIJ pain is managed by injections and this will also help with the knee pain. Patient limited in left hip ROM in all directions with pain in knee presenting with left hip IR. Educated patient on findings and POC moving forward. Patient would greatly benefit from skilled PT to improve overall function and QOL.      OBJECTIVE IMPAIRMENTS Abnormal gait, decreased activity tolerance, decreased balance, decreased mobility, difficulty walking, decreased ROM, decreased strength, postural dysfunction, and pain.    ACTIVITY LIMITATIONS sleeping and locomotion level   PARTICIPATION LIMITATIONS: shopping, community activity, and yard work   PERSONAL FACTORS Age and 1-2 comorbidities: chronic left SIJ pain, R THA   are also affecting patient's functional outcome.    REHAB POTENTIAL: Good   CLINICAL DECISION MAKING: Stable/uncomplicated   EVALUATION COMPLEXITY: Low   GOALS: Goals reviewed with patient?  yes   SHORT  TERM GOALS:   Patient will be independent in self management strategies to improve quality of life and functional outcomes. Baseline: new program Target date: 04/07/2022 Goal status: MET    2.  Patient will report at least 50% improvement in overall symptoms and/or function to demonstrate improved functional mobility Baseline: 0% Target date: 04/07/2022 Goal status: MET    3.  Patient will be able to report sleeping through the night without waking up secondary to pain in her left knee to improve sleep quality. Baseline: painful Target date: 05/07/2022 Goal status: ONGOING, Pt intermittently has been taking pain meds to sleep through the night. She says she only takes pain meds when  she does water aerobics.      LONG TERM GOALS:   Patient will report at least 75% improvement in overall symptoms and/or function to demonstrate improved functional mobility Baseline: 0% Target date: 05/28/2022 Goal status: 40% improvements per pt.    2.  Patient will demonstrate at least 15 degrees of left hip IR and ER to improve ROM in hip  Baseline: see above Target date: 05/28/2022 Goal status: 10 degrees internal rotation on L LE.    3.  Patient will report ability to perform more LE exercises while in aquatics to improve strength and mobility. Baseline: unable Target date: 05/12/2022 Goal status: MET   PLAN: PT FREQUENCY: 2x/week    PT DURATION: 6 weeks   PLANNED INTERVENTIONS: Therapeutic exercises, Therapeutic activity, Neuromuscular re-education, Balance training, Gait training, Patient/Family education, Self Care, Joint mobilization, Joint manipulation, Orthotic/Fit training, DME instructions, Aquatic Therapy, Dry Needling, Electrical stimulation, Spinal manipulation, Spinal mobilization, Cryotherapy, Moist heat, Ionotophoresis 4mg /ml Dexamethasone, and Manual therapy.   PLAN FOR NEXT SESSION: PN, patient likes some form of manual (gentle STM, tibial bouncing) then exercises, gentle knee  ext,ankle and hip mobility L.; left hip STM, mobilizations, long axis traction, hip IR/ER Ext/flexion, isometrics and strengthening   2:29 PM, 05/18/22 Jerene Pitch, DPT Physical Therapy with [facility]

## 2022-05-19 ENCOUNTER — Telehealth: Payer: Self-pay

## 2022-05-19 NOTE — Telephone Encounter (Signed)
POC FOLLOW UP   POC sent to [facility]bo on 03/03/22 By Sharyn Lull.   Detailed Voice mail left with Wyatt Portela, Dr Shade Flood CMA at ext [phone] .    POC as been faxed on 05/19/22 to [phone]. Note included in cover sheet: MD signature needed on page 6 of POC.    Confirmation received .

## 2022-05-20 ENCOUNTER — Encounter: Payer: Medicare Other | Admitting: Physical Therapy

## 2022-05-20 ENCOUNTER — Encounter: Payer: Self-pay | Admitting: Physical Therapy

## 2022-05-20 ENCOUNTER — Ambulatory Visit: Payer: Medicare Other | Admitting: Physical Therapy

## 2022-05-20 DIAGNOSIS — M6281 Muscle weakness (generalized): Secondary | ICD-10-CM

## 2022-05-20 DIAGNOSIS — M25562 Pain in left knee: Secondary | ICD-10-CM | POA: Diagnosis not present

## 2022-05-20 DIAGNOSIS — G8929 Other chronic pain: Secondary | ICD-10-CM

## 2022-05-20 DIAGNOSIS — M533 Sacrococcygeal disorders, not elsewhere classified: Secondary | ICD-10-CM | POA: Diagnosis not present

## 2022-05-20 NOTE — Therapy (Addendum)
OUTPATIENT PHYSICAL THERAPY TREATMENT NOTE, and progress note Progress Note Reporting Period 04/16/22 to 05/28/22  See note below for Objective Data and Assessment of Progress/Goals.      Patient Name: Kaitlyn Parks MRN: [id number] DOB:02-18-1935, 87 y.o., female Today's Date: 05/20/2022   END OF SESSION:   PT End of Session - 05/20/22 1301     Visit Number 19    Number of Visits 24    Date for PT Re-Evaluation 05/28/22    Authorization Type [employer]    Progress Note Due on Visit 16    PT Start Time 1302    PT Stop Time 1344    PT Time Calculation (min) 42 min    Activity Tolerance Patient tolerated treatment well    Behavior During Therapy WFL for tasks assessed/performed                  Past Medical History:  Diagnosis Date   Arthritis    DDD, scoliosis, sees Dr. Arnoldo Morale for this, uses norco very rarely for pain   Bradycardia 11/11/2017   CAD (coronary artery disease)    LAD stenting of a 90% lesion 2012   Cystocele    Educated about COVID-19 virus infection 12/06/2019   Elevated cholesterol    GERD (gastroesophageal reflux disease)    dx in work up 2016 for atypical CP at OSH   pt. denies   Heart murmur    Hypertensive retinopathy    OU   Hypothyroidism    Interstitial cystitis    sees Dr. Jeffie Pollock   Macular degeneration    OU   NSTEMI (non-ST elevated myocardial infarction) [facility])    Osteoporosis    Rectocele    S/P hip replacement, right 06/12/2017   Scoliosis    Thyroid disease    Hypothyroid   Urinary incontinence    USI   Uterine prolapse    Past Surgical History:  Procedure Laterality Date   BLADDER SUSPENSION  2011   CATARACT EXTRACTION Bilateral 2015   Dr. Herbert Deaner   CORONARY ANGIOPLASTY WITH STENT PLACEMENT  04/21/2010   LAD 80%, RCA 30%, nl EF, s/p DES LAD   EYE SURGERY     LEFT HEART CATH AND CORONARY ANGIOGRAPHY N/A 06/14/2017   Procedure: LEFT HEART CATH AND CORONARY ANGIOGRAPHY;  Surgeon: Lorretta Harp, MD;  Location: [facility];  Service: Cardiovascular;  Laterality: N/A;   OOPHORECTOMY  2011   BSO   TOTAL HIP ARTHROPLASTY Right 06/10/2017   Procedure: RIGHT TOTAL HIP ARTHROPLASTY ANTERIOR APPROACH;  Surgeon: Rod Can, MD;  Location: [facility];  Service: Orthopedics;  Laterality: Right;  Needs RNFA   VAGINAL HYSTERECTOMY  2011   LAVH BSO; benign   Patient Active Problem List   Diagnosis Date Noted   Aortic atherosclerosis ([place]) 12/07/2019   Macular degeneration 01/11/2019   Coronary artery disease involving native coronary artery of native heart without angina pectoris 10/29/2018   Presence of intraocular lens 09/23/2018   Bradycardia 11/11/2017   Dyslipidemia 11/11/2017   Pain in joint of right hip 08/04/2017   CAD (coronary artery disease) 06/12/2017   GERD (gastroesophageal reflux disease) 06/12/2017   S/P hip replacement, right 06/12/2017   Osteoarthritis of right hip 06/10/2017   CAD S/P percutaneous coronary angioplasty 02/11/2015   Renal insufficiency 02/11/2015   Prolonged Q-T interval on ECG 04/18/2010   Hypertension 08/11/2007   Hypothyroidism (acquired) 06/08/2007   MITRAL VALVE PROLAPSE 06/08/2007   PCP: [name], MD  REFERRING PROVIDER: Reece Agar   REFERRING DIAG: Sacroiliitis M46.1   Rationale for Evaluation and Treatment Rehabilitation   THERAPY DIAG:  Sacroiliac joint dysfunction   Muscle weakness (generalized)   Chronic pain of left knee   ONSET DATE: 4 years    SUBJECTIVE:                                                                                                                                                                                            SUBJECTIVE STATEMENT:  05/20/2022 States that she is feeling fine and does not have any pain. States overall she feels 87% better since the start of PT. States she does her exercises daily and goes to water exercise class regularly.  Eval: Patient complains of chronic left SIJ pain that has been  managed with quarterly injections. Reports she also has left knee pain that wakes her up at night and this is her biggest complaint. States that she also uses heat and ice and this helps. She is active in water classes Tues/Thurs in the AM and would liek to go back to yoga.   PERTINENT HISTORY:  Scoliosis, R THA 5 years ago, history of cardiac issues   PAIN:  Are you having pain? no: NPRS scale: 0/10 in the knee-  Pain location: left knee pain Pain description: throbbing Aggravating factors: aggressive aquatic routine, long distance walking, grocery store Relieving factors: ice, rest     PRECAUTIONS: None   WEIGHT BEARING RESTRICTIONS No   FALLS:  Has patient fallen in last 6 months? No   LIVING ENVIRONMENT: Lives with: lives alone Lives in: House/apartment     OCCUPATION: not working, likes to carden in her potted plants    PLOF: Independent   PATIENT GOALS to not have left knee pain. To go back to yoga (stopped 7 years ago)     OBJECTIVE:    DIAGNOSTIC FINDINGS:  None recent at [facility]     SCREENING FOR RED FLAGS: Bowel or bladder incontinence: No Spinal tumors: No Cauda equina syndrome: No Compression fracture: No Abdominal aneurysm: No   COGNITION:           Overall cognitive status: Within functional limits for tasks assessed                              MUSCLE LENGTH: Hamstrings: Right 30 deg; Left 30 deg     POSTURE: S curve in spine, right hip higher than left, forward head.    PALPATION: Tenderness to palpation along glute meds and lumbar paraspinals  LE Measurements         Lower Extremity Right 03/03/2022 Left 03/03/2022 R/L 05/20/22    A/PROM MMT A/PROM MMT ROM MMT  Hip Flexion 105 4 90 4- 110/110   Hip Extension            Hip Abduction            Hip Adduction            Hip Internal rotation 45   5*   30/15   Hip External rotation 40   10   45/ 30   Knee Flexion 120 4 120* 4- 120/ 120   Knee Extension 0 4 8 lacking extension  4- 0/ Lacking 3   Ankle Dorsiflexion            Ankle Plantarflexion            Ankle Inversion            Ankle Eversion             (Blank rows = not tested)            * pain     LUMBAR SPECIAL TESTS:  Positive ely's test on left    TODAY'S TREATMENT  05/20/2022 TE:  Supine: bent knee fall outs 2x15 B, bent knee fall ins 2x15 B, SKC x15 B, hip abd x30 B  Seated: LAQs x15 L  Manual: IASTM to left groin - tolerated well - used percussion gun with padding   PATIENT EDUCATION:  Education details: on HEP, on progress made and POC moving forward Person educated: Patient Education method: Consulting civil engineer, Media planner, and Handouts Education comprehension: verbalized understanding   HOME EXERCISE PROGRAM: [id number]   ASSESSMENT:   CLINICAL IMPRESSION: 05/20/2022 Overall patient is doing well and has met all but one goal at this time. Reviewed current presentation and progress with patient and cause for left knee pain. Added additional interventions to HEP and answered all questions. Discussed working towards DC with plan to continue with HEP at home. Overall patient would continue to benefit from skilled PT to improve overall function and indenpence in HEP.   Eval: Patient complains of left SIJ and left knee pain that started years ago. SIJ pain is managed by injections and this will also help with the knee pain. Patient limited in left hip ROM in all directions with pain in knee presenting with left hip IR. Educated patient on findings and POC moving forward. Patient would greatly benefit from skilled PT to improve overall function and QOL.      OBJECTIVE IMPAIRMENTS Abnormal gait, decreased activity tolerance, decreased balance, decreased mobility, difficulty walking, decreased ROM, decreased strength, postural dysfunction, and pain.    ACTIVITY LIMITATIONS sleeping and locomotion level   PARTICIPATION LIMITATIONS: shopping, community activity, and yard work   PERSONAL FACTORS  Age and 1-2 comorbidities: chronic left SIJ pain, R THA   are also affecting patient's functional outcome.    REHAB POTENTIAL: Good   CLINICAL DECISION MAKING: Stable/uncomplicated   EVALUATION COMPLEXITY: Low   GOALS: Goals reviewed with patient?  yes   SHORT TERM GOALS:   Patient will be independent in self management strategies to improve quality of life and functional outcomes. Baseline: new program Target date: 04/07/2022 Goal status: PROGRESSING     2.  Patient will report at least 50% improvement in overall symptoms and/or function to demonstrate improved functional mobility Baseline: 0% Target date: 04/07/2022 Goal status: MET    3.  Patient  will be able to report sleeping through the night without waking up secondary to pain in her left knee to improve sleep quality. Baseline: painful Target date: 05/07/2022 Goal status: MET      LONG TERM GOALS:   Patient will report at least 75% improvement in overall symptoms and/or function to demonstrate improved functional mobility Baseline: 0% Target date: 05/28/2022 Goal status: MET   2.  Patient will demonstrate at least 15 degrees of left hip IR and ER to improve ROM in hip  Baseline: see above Target date: 05/28/2022 Goal status: MET    3.  Patient will report ability to perform more LE exercises while in aquatics to improve strength and mobility. Baseline: unable Target date: 05/12/2022 Goal status: MET   PLAN: PT FREQUENCY: 2x/week    PT DURATION: 6 weeks   PLANNED INTERVENTIONS: Therapeutic exercises, Therapeutic activity, Neuromuscular re-education, Balance training, Gait training, Patient/Family education, Self Care, Joint mobilization, Joint manipulation, Orthotic/Fit training, DME instructions, Aquatic Therapy, Dry Needling, Electrical stimulation, Spinal manipulation, Spinal mobilization, Cryotherapy, Moist heat, Ionotophoresis 3m/ml Dexamethasone, and Manual therapy.   PLAN FOR NEXT SESSION: , patient  likes some form of manual (gentle STM, tibial bouncing) then exercises, gentle knee ext,ankle and hip mobility L.; left hip STM, mobilizations, long axis traction, hip IR/ER Ext/flexion, isometrics and strengthening   KX modifier applied to the visit - patient medically necessary to work on reducing pain and improving overall function and ability to walk and maintain independence in the home and community.  1:55 PM, 05/20/22 MJerene Pitch DPT [facility]

## 2022-05-25 ENCOUNTER — Encounter: Payer: Medicare Other | Admitting: Physical Therapy

## 2022-05-25 ENCOUNTER — Ambulatory Visit: Payer: Medicare Other | Admitting: Physical Therapy

## 2022-05-25 ENCOUNTER — Encounter: Payer: Self-pay | Admitting: Physical Therapy

## 2022-05-25 DIAGNOSIS — M533 Sacrococcygeal disorders, not elsewhere classified: Secondary | ICD-10-CM

## 2022-05-25 DIAGNOSIS — G8929 Other chronic pain: Secondary | ICD-10-CM

## 2022-05-25 DIAGNOSIS — M6281 Muscle weakness (generalized): Secondary | ICD-10-CM

## 2022-05-25 DIAGNOSIS — M25562 Pain in left knee: Secondary | ICD-10-CM

## 2022-05-25 NOTE — Therapy (Signed)
OUTPATIENT PHYSICAL THERAPY TREATMENT NOTE    Patient Name: Kaitlyn Parks MRN: [id number] DOB:1935/03/13, 87 y.o., female Today's Date: 05/25/2022   END OF SESSION:   PT End of Session - 05/25/22 1259     Visit Number 20    Number of Visits 24    Date for PT Re-Evaluation 05/28/22    Authorization Type U[employer]    Progress Note Due on Visit 60    PT Start Time 1301    PT Stop Time 1341    PT Time Calculation (min) 40 min    Activity Tolerance Patient tolerated treatment well    Behavior During Therapy WFL for tasks assessed/performed                  Past Medical History:  Diagnosis Date   Arthritis    DDD, scoliosis, sees Dr. Arnoldo Morale for this, uses norco very rarely for pain   Bradycardia 11/11/2017   CAD (coronary artery disease)    LAD stenting of a 90% lesion 2012   Cystocele    Educated about COVID-19 virus infection 12/06/2019   Elevated cholesterol    GERD (gastroesophageal reflux disease)    dx in work up 2016 for atypical CP at OSH   pt. denies   Heart murmur    Hypertensive retinopathy    OU   Hypothyroidism    Interstitial cystitis    sees Dr. Jeffie Pollock   Macular degeneration    OU   NSTEMI (non-ST elevated myocardial infarction) [facility])    Osteoporosis    Rectocele    S/P hip replacement, right 06/12/2017   Scoliosis    Thyroid disease    Hypothyroid   Urinary incontinence    USI   Uterine prolapse    Past Surgical History:  Procedure Laterality Date   BLADDER SUSPENSION  2011   CATARACT EXTRACTION Bilateral 2015   Dr. Herbert Deaner   CORONARY ANGIOPLASTY WITH STENT PLACEMENT  04/21/2010   LAD 80%, RCA 30%, nl EF, s/p DES LAD   EYE SURGERY     LEFT HEART CATH AND CORONARY ANGIOGRAPHY N/A 06/14/2017   Procedure: LEFT HEART CATH AND CORONARY ANGIOGRAPHY;  Surgeon: Lorretta Harp, MD;  Location: [facility];  Service: Cardiovascular;  Laterality: N/A;   OOPHORECTOMY  2011   BSO   TOTAL HIP ARTHROPLASTY Right 06/10/2017   Procedure: RIGHT  TOTAL HIP ARTHROPLASTY ANTERIOR APPROACH;  Surgeon: Rod Can, MD;  Location: [facility];  Service: Orthopedics;  Laterality: Right;  Needs RNFA   VAGINAL HYSTERECTOMY  2011   LAVH BSO; benign   Patient Active Problem List   Diagnosis Date Noted   Aortic atherosclerosis ([place]) 12/07/2019   Macular degeneration 01/11/2019   Coronary artery disease involving native coronary artery of native heart without angina pectoris 10/29/2018   Presence of intraocular lens 09/23/2018   Bradycardia 11/11/2017   Dyslipidemia 11/11/2017   Pain in joint of right hip 08/04/2017   CAD (coronary artery disease) 06/12/2017   GERD (gastroesophageal reflux disease) 06/12/2017   S/P hip replacement, right 06/12/2017   Osteoarthritis of right hip 06/10/2017   CAD S/P percutaneous coronary angioplasty 02/11/2015   Renal insufficiency 02/11/2015   Prolonged Q-T interval on ECG 04/18/2010   Hypertension 08/11/2007   Hypothyroidism (acquired) 06/08/2007   MITRAL VALVE PROLAPSE 06/08/2007   PCP: [name], MD   REFERRING PROVIDER: Reece Agar   REFERRING DIAG: Sacroiliitis M46.1   Rationale for Evaluation and Treatment Rehabilitation   THERAPY DIAG:  Sacroiliac joint dysfunction   Muscle weakness (generalized)   Chronic pain of left knee   ONSET DATE: 4 years    SUBJECTIVE:                                                                                                                                                                                            SUBJECTIVE STATEMENT: 05/25/2022 States that she went on a walk for the first time in a while and she did good. States she is doing her exercises and feels good but would like to go over them, states she has been having arthritis in her right hand.   Eval: Patient complains of chronic left SIJ pain that has been managed with quarterly injections. Reports she also has left knee pain that wakes her up at night and this is her biggest  complaint. States that she also uses heat and ice and this helps. She is active in water classes Tues/Thurs in the AM and would liek to go back to yoga.   PERTINENT HISTORY:  Scoliosis, R THA 5 years ago, history of cardiac issues   PAIN:  Are you having pain? no: NPRS scale: 0/10 in the knee-  Pain location: left knee pain Pain description: throbbing Aggravating factors: aggressive aquatic routine, long distance walking, grocery store Relieving factors: ice, rest     PRECAUTIONS: None   WEIGHT BEARING RESTRICTIONS No   FALLS:  Has patient fallen in last 6 months? No   LIVING ENVIRONMENT: Lives with: lives alone Lives in: House/apartment     OCCUPATION: not working, likes to carden in her potted plants    PLOF: Independent   PATIENT GOALS to not have left knee pain. To go back to yoga (stopped 7 years ago)     OBJECTIVE:    DIAGNOSTIC FINDINGS:  None recent at [facility]     SCREENING FOR RED FLAGS: Bowel or bladder incontinence: No Spinal tumors: No Cauda equina syndrome: No Compression fracture: No Abdominal aneurysm: No   COGNITION:           Overall cognitive status: Within functional limits for tasks assessed                              MUSCLE LENGTH: Hamstrings: Right 30 deg; Left 30 deg     POSTURE: S curve in spine, right hip higher than left, forward head.    PALPATION: Tenderness to palpation along glute meds and lumbar paraspinals              LE Measurements  Lower Extremity Right 03/03/2022 Left 03/03/2022 R/L 05/20/22    A/PROM MMT A/PROM MMT ROM MMT  Hip Flexion 105 4 90 4- 110/110   Hip Extension            Hip Abduction            Hip Adduction            Hip Internal rotation 45   5*   30/15   Hip External rotation 40   10   45/ 30   Knee Flexion 120 4 120* 4- 120/ 120   Knee Extension 0 4 8 lacking extension 4- 0/ Lacking 3   Ankle Dorsiflexion            Ankle Plantarflexion            Ankle Inversion            Ankle  Eversion             (Blank rows = not tested)            * pain     LUMBAR SPECIAL TESTS:  Positive ely's test on left    TODAY'S TREATMENT  05/25/2022 TE:  Supine: bent knee fall outs x30 B, bent knee fall ins 2x15 B, SKC x15 B, hip abd x30 B  Seated: LAQs x15 L, reviewed HEP in entirety.   Manual: IASTM to right hand 8 minutes - tolerated well, STM to left hip/groin with PROM left hip   PATIENT EDUCATION:  Education details: on HEP on importance of ROM Person educated: Patient Education method: Explanation, Demonstration, and Handouts Education comprehension: verbalized understanding   HOME EXERCISE PROGRAM: [id number]   ASSESSMENT:   CLINICAL IMPRESSION: 05/25/2022 Focused on reviewing HEP and answering all questions. Focused on educating patient that knee pain is result of limited hip ROM. Improved knee extension and hip ROM noted end of session. Will continue with current POC as tolerated.   Eval: Patient complains of left SIJ and left knee pain that started years ago. SIJ pain is managed by injections and this will also help with the knee pain. Patient limited in left hip ROM in all directions with pain in knee presenting with left hip IR. Educated patient on findings and POC moving forward. Patient would greatly benefit from skilled PT to improve overall function and QOL.      OBJECTIVE IMPAIRMENTS Abnormal gait, decreased activity tolerance, decreased balance, decreased mobility, difficulty walking, decreased ROM, decreased strength, postural dysfunction, and pain.    ACTIVITY LIMITATIONS sleeping and locomotion level   PARTICIPATION LIMITATIONS: shopping, community activity, and yard work   PERSONAL FACTORS Age and 1-2 comorbidities: chronic left SIJ pain, R THA   are also affecting patient's functional outcome.    REHAB POTENTIAL: Good   CLINICAL DECISION MAKING: Stable/uncomplicated   EVALUATION COMPLEXITY: Low   GOALS: Goals reviewed with patient?  yes    SHORT TERM GOALS:   Patient will be independent in self management strategies to improve quality of life and functional outcomes. Baseline: new program Target date: 04/07/2022 Goal status: PROGRESSING     2.  Patient will report at least 50% improvement in overall symptoms and/or function to demonstrate improved functional mobility Baseline: 0% Target date: 04/07/2022 Goal status: MET    3.  Patient will be able to report sleeping through the night without waking up secondary to pain in her left knee to improve sleep quality. Baseline: painful Target date: 05/07/2022 Goal status: MET  LONG TERM GOALS:   Patient will report at least 75% improvement in overall symptoms and/or function to demonstrate improved functional mobility Baseline: 0% Target date: 05/28/2022 Goal status: MET   2.  Patient will demonstrate at least 15 degrees of left hip IR and ER to improve ROM in hip  Baseline: see above Target date: 05/28/2022 Goal status: MET    3.  Patient will report ability to perform more LE exercises while in aquatics to improve strength and mobility. Baseline: unable Target date: 05/12/2022 Goal status: MET   PLAN: PT FREQUENCY: 2x/week    PT DURATION: 6 weeks   PLANNED INTERVENTIONS: Therapeutic exercises, Therapeutic activity, Neuromuscular re-education, Balance training, Gait training, Patient/Family education, Self Care, Joint mobilization, Joint manipulation, Orthotic/Fit training, DME instructions, Aquatic Therapy, Dry Needling, Electrical stimulation, Spinal manipulation, Spinal mobilization, Cryotherapy, Moist heat, Ionotophoresis 4mg /ml Dexamethasone, and Manual therapy.   PLAN FOR NEXT SESSION: , patient likes some form of manual (gentle STM, tibial bouncing) then exercises, gentle knee ext,ankle and hip mobility L.; left hip STM, mobilizations, long axis traction, hip IR/ER Ext/flexion, isometrics and strengthening   KX modifier applied to the visit - patient  medically necessary to work on reducing pain and improving overall function and ability to walk and maintain independence in the home and community.  1:48 PM, 05/25/22 Jerene Pitch, DPT Physical Therapy with [facility]

## 2022-05-27 ENCOUNTER — Encounter: Payer: Medicare Other | Admitting: Physical Therapy

## 2022-05-27 ENCOUNTER — Ambulatory Visit: Payer: Medicare Other | Admitting: Physical Therapy

## 2022-05-27 ENCOUNTER — Encounter: Payer: Self-pay | Admitting: Physical Therapy

## 2022-05-27 DIAGNOSIS — M25562 Pain in left knee: Secondary | ICD-10-CM | POA: Diagnosis not present

## 2022-05-27 DIAGNOSIS — M6281 Muscle weakness (generalized): Secondary | ICD-10-CM

## 2022-05-27 DIAGNOSIS — M533 Sacrococcygeal disorders, not elsewhere classified: Secondary | ICD-10-CM | POA: Diagnosis not present

## 2022-05-27 DIAGNOSIS — G8929 Other chronic pain: Secondary | ICD-10-CM | POA: Diagnosis not present

## 2022-05-27 NOTE — Therapy (Signed)
OUTPATIENT PHYSICAL THERAPY TREATMENT NOTE PHYSICAL THERAPY DISCHARGE SUMMARY  Visits from Start of Care: 21  Current functional level related to goals / functional outcomes: All goals met   Remaining deficits: Continued weakness and ROM defecits   Education / Equipment: See below   Patient agrees to discharge. Patient goals were met. Patient is being discharged due to meeting the stated rehab goals.    Patient Name: Kaitlyn Parks MRN: [id number] DOB:Oct 31, 1934, 87 y.o., female Today's Date: 05/27/2022   END OF SESSION:   PT End of Session - 05/27/22 1307     Visit Number 21    Number of Visits 24    Date for PT Re-Evaluation 05/28/22    Authorization Type [employer]    Progress Note Due on Visit 88    PT Start Time 1305    PT Stop Time 1345    PT Time Calculation (min) 40 min    Activity Tolerance Patient tolerated treatment well    Behavior During Therapy [facility] for tasks assessed/performed                  Past Medical History:  Diagnosis Date   Arthritis    DDD, scoliosis, sees Dr. Arnoldo Morale for this, uses norco very rarely for pain   Bradycardia 11/11/2017   CAD (coronary artery disease)    LAD stenting of a 90% lesion 2012   Cystocele    Educated about COVID-19 virus infection 12/06/2019   Elevated cholesterol    GERD (gastroesophageal reflux disease)    dx in work up 2016 for atypical CP at OSH   pt. denies   Heart murmur    Hypertensive retinopathy    OU   Hypothyroidism    Interstitial cystitis    sees Dr. Jeffie Pollock   Macular degeneration    OU   NSTEMI (non-ST elevated myocardial infarction) [facility])    Osteoporosis    Rectocele    S/P hip replacement, right 06/12/2017   Scoliosis    Thyroid disease    Hypothyroid   Urinary incontinence    USI   Uterine prolapse    Past Surgical History:  Procedure Laterality Date   BLADDER SUSPENSION  2011   CATARACT EXTRACTION Bilateral 2015   Dr. Herbert Deaner   CORONARY ANGIOPLASTY WITH STENT PLACEMENT   04/21/2010   LAD 80%, RCA 30%, nl EF, s/p DES LAD   EYE SURGERY     LEFT HEART CATH AND CORONARY ANGIOGRAPHY N/A 06/14/2017   Procedure: LEFT HEART CATH AND CORONARY ANGIOGRAPHY;  Surgeon: Lorretta Harp, MD;  Location: [place] [facility];  Service: Cardiovascular;  Laterality: N/A;   OOPHORECTOMY  2011   BSO   TOTAL HIP ARTHROPLASTY Right 06/10/2017   Procedure: RIGHT TOTAL HIP ARTHROPLASTY ANTERIOR APPROACH;  Surgeon: Rod Can, MD;  Location: [facility];  Service: Orthopedics;  Laterality: Right;  Needs RNFA   VAGINAL HYSTERECTOMY  2011   LAVH BSO; benign   Patient Active Problem List   Diagnosis Date Noted   Aortic atherosclerosis ([place]) 12/07/2019   Macular degeneration 01/11/2019   Coronary artery disease involving native coronary artery of native heart without angina pectoris 10/29/2018   Presence of intraocular lens 09/23/2018   Bradycardia 11/11/2017   Dyslipidemia 11/11/2017   Pain in joint of right hip 08/04/2017   CAD (coronary artery disease) 06/12/2017   GERD (gastroesophageal reflux disease) 06/12/2017   S/P hip replacement, right 06/12/2017   Osteoarthritis of right hip 06/10/2017   CAD S/P percutaneous coronary  angioplasty 02/11/2015   Renal insufficiency 02/11/2015   Prolonged Q-T interval on ECG 04/18/2010   Hypertension 08/11/2007   Hypothyroidism (acquired) 06/08/2007   MITRAL VALVE PROLAPSE 06/08/2007   PCP: Theodis Shove, MD   REFERRING PROVIDER: Renaldo Fiddler   REFERRING DIAG: Sacroiliitis M46.1   Rationale for Evaluation and Treatment Rehabilitation   THERAPY DIAG:  Sacroiliac joint dysfunction   Muscle weakness (generalized)   Chronic pain of left knee   ONSET DATE: 4 years    SUBJECTIVE:                                                                                                                                                                                            SUBJECTIVE STATEMENT: 05/27/2022 States she has still not  needed to take any pain medications. States she did exercises today no pain  Eval: Patient complains of chronic left SIJ pain that has been managed with quarterly injections. Reports she also has left knee pain that wakes her up at night and this is her biggest complaint. States that she also uses heat and ice and this helps. She is active in water classes Tues/Thurs in the AM and would liek to go back to yoga.   PERTINENT HISTORY:  Scoliosis, R THA 5 years ago, history of cardiac issues   PAIN:  Are you having pain? no: NPRS scale: 0/10 in the knee-  Pain location: left knee pain Pain description: throbbing Aggravating factors: aggressive aquatic routine, long distance walking, grocery store Relieving factors: ice, rest     PRECAUTIONS: None   WEIGHT BEARING RESTRICTIONS No   FALLS:  Has patient fallen in last 6 months? No   LIVING ENVIRONMENT: Lives with: lives alone Lives in: House/apartment     OCCUPATION: not working, likes to carden in her potted plants    PLOF: Independent   PATIENT GOALS to not have left knee pain. To go back to yoga (stopped 7 years ago)     OBJECTIVE:                 LE Measurements         Lower Extremity Right 03/03/2022 Left 03/03/2022 R/L 05/20/22    A/PROM MMT A/PROM MMT ROM MMT  Hip Flexion 105 4 90 4- 110/110   Hip Extension            Hip Abduction            Hip Adduction            Hip Internal rotation 45   5*   30/15   Hip External rotation 40  10   45/ 30   Knee Flexion 120 4 120* 4- 120/ 120   Knee Extension 0 4 8 lacking extension 4- 0/ Lacking 3   Ankle Dorsiflexion            Ankle Plantarflexion            Ankle Inversion            Ankle Eversion             (Blank rows = not tested)            * pain     LUMBAR SPECIAL TESTS:  Positive ely's test on left    TODAY'S TREATMENT  05/27/2022 TE:  Seated: LAQs x15 L, seated hip IR/ER PROM then AROM L, shoulder flexion/scaption/horizontal abd 3x12 B  Standing:  shoulder flexion up wall x20 5" holds, shoulder extension at wall x20 5" holds B, shoulder ER at wall x25 5" hlds, shoulder Ext red band x30 5" holds     PATIENT EDUCATION:  Education details: on HEP  Person educated: Patient Education method: Explanation, Media planner, and Handouts Education comprehension: verbalized understanding   HOME EXERCISE PROGRAM: [id number]   ASSESSMENT:   CLINICAL IMPRESSION: 05/27/2022  Reviewed all exercises and answered all questions. Added posture exercises per patient complaint of being able to stand up/sit up tall. No pain with these exercises. All goals met. Patient to DC from PT to HEP At this time.   Eval: Patient complains of left SIJ and left knee pain that started years ago. SIJ pain is managed by injections and this will also help with the knee pain. Patient limited in left hip ROM in all directions with pain in knee presenting with left hip IR. Educated patient on findings and POC moving forward. Patient would greatly benefit from skilled PT to improve overall function and QOL.      OBJECTIVE IMPAIRMENTS Abnormal gait, decreased activity tolerance, decreased balance, decreased mobility, difficulty walking, decreased ROM, decreased strength, postural dysfunction, and pain.    ACTIVITY LIMITATIONS sleeping and locomotion level   PARTICIPATION LIMITATIONS: shopping, community activity, and yard work   PERSONAL FACTORS Age and 1-2 comorbidities: chronic left SIJ pain, R THA   are also affecting patient's functional outcome.    REHAB POTENTIAL: Good   CLINICAL DECISION MAKING: Stable/uncomplicated   EVALUATION COMPLEXITY: Low   GOALS: Goals reviewed with patient?  yes   SHORT TERM GOALS:   Patient will be independent in self management strategies to improve quality of life and functional outcomes. Baseline: new program Target date: 04/07/2022 Goal status: MET   2.  Patient will report at least 50% improvement in overall symptoms and/or  function to demonstrate improved functional mobility Baseline: 0% Target date: 04/07/2022 Goal status: MET    3.  Patient will be able to report sleeping through the night without waking up secondary to pain in her left knee to improve sleep quality. Baseline: painful Target date: 05/07/2022 Goal status: MET      LONG TERM GOALS:   Patient will report at least 75% improvement in overall symptoms and/or function to demonstrate improved functional mobility Baseline: 0% Target date: 05/28/2022 Goal status: MET   2.  Patient will demonstrate at least 15 degrees of left hip IR and ER to improve ROM in hip  Baseline: see above Target date: 05/28/2022 Goal status: MET    3.  Patient will report ability to perform more LE exercises while in aquatics to improve strength and  mobility. Baseline: unable Target date: 05/12/2022 Goal status: MET   PLAN: PT FREQUENCY: 2x/week    PT DURATION: 6 weeks   PLANNED INTERVENTIONS: Therapeutic exercises, Therapeutic activity, Neuromuscular re-education, Balance training, Gait training, Patient/Family education, Self Care, Joint mobilization, Joint manipulation, Orthotic/Fit training, DME instructions, Aquatic Therapy, Dry Needling, Electrical stimulation, Spinal manipulation, Spinal mobilization, Cryotherapy, Moist heat, Ionotophoresis 4mg /ml Dexamethasone, and Manual therapy.   PLAN FOR NEXT SESSION: , patient likes some form of manual (gentle STM, tibial bouncing) then exercises, gentle knee ext,ankle and hip mobility L.; left hip STM, mobilizations, long axis traction, hip IR/ER Ext/flexion, isometrics and strengthening   KX modifier applied to the visit - patient medically necessary to work on reducing pain and improving overall function and ability to walk and maintain independence in the home and community.  2:02 PM, 05/27/22 Jerene Pitch, DPT Physical Therapy with [facility]

## 2022-06-11 ENCOUNTER — Ambulatory Visit: Payer: Medicare Other | Admitting: Family Medicine

## 2022-06-11 ENCOUNTER — Encounter: Payer: Self-pay | Admitting: Family Medicine

## 2022-06-11 VITALS — BP 102/58 | HR 69 | Temp 98.2°F | Ht 63.0 in | Wt 124.4 lb

## 2022-06-11 DIAGNOSIS — K2900 Acute gastritis without bleeding: Secondary | ICD-10-CM | POA: Diagnosis not present

## 2022-06-11 DIAGNOSIS — K2970 Gastritis, unspecified, without bleeding: Secondary | ICD-10-CM | POA: Insufficient documentation

## 2022-06-11 MED ORDER — PANTOPRAZOLE SODIUM 40 MG PO TBEC: 40.0000 mg | DELAYED_RELEASE_TABLET | Freq: Every day | ORAL | 3 refills | Status: DC

## 2022-06-11 MED ORDER — ONDANSETRON 4 MG PO TBDP: 4.0000 mg | ORAL_TABLET | Freq: Three times a day (TID) | ORAL | 0 refills | Status: DC | PRN

## 2022-06-11 NOTE — Assessment & Plan Note (Signed)
Patinet had 1 black stool 2 days ago after her symptoms started. Is having some epigastric tenderness on exam and burning. I suspect acute gastritis or possibly even a gastric ulcer. I advised that she start pantoprazole 40 mg BID for 1 week then if her symptoms are improved she can reduce to once daily. I also gave her more ondansetron 4 mg PRN every 8 hours for nausea. I advised that if she is not feeling better in a few days she should contact her GI doctor to get an appointment.

## 2022-06-11 NOTE — Progress Notes (Signed)
Established Patient Office Visit  Subjective   Patient ID: Kaitlyn Parks, female    DOB: 05/20/1935  Age: 87 y.o. MRN: [id number]  Chief Complaint  Patient presents with   Abdominal Pain    Patient complains of vomiting, burning sensation in the stomach x2 days after eating crab cakes    Patient reports that 2 days ago she ate a crab cake that didn't agree with her so about an hour later she had an episode of vomiting. States that since then she hasn't "felt right" has not had a BM for 2 days. States she was taking some left over doxycycline and also some ondansetron which she had from a previous illness. States that the pain in her upper abdomen is burning. Patient reports she feels very nauseated but has not had any more episodes of emesis. States that she ate about half of her breakfast yesterday and some lunch. States that she is still passing some gas, did try to take a gas-x without any relief. States that the night she had the vomiting episode she did have a BM that was black in color.   Abdominal Pain This is a new problem. The current episode started in the past 7 days. The onset quality is sudden.   Current Outpatient Medications  Medication Instructions   alum & mag hydroxide-simeth (MAALOX PLUS) 400-400-40 MG/5ML suspension 15 mLs, Oral, Every 6 hours PRN   amLODipine (NORVASC) 2.5 mg, Oral, Daily   aspirin EC 81 mg, Oral, Daily   Biotin w/ Vitamins C & E (HAIR/SKIN/NAILS PO) 1 tablet, Oral, Daily   Cranberry 400 mg, Oral, Daily   hydrALAZINE (APRESOLINE) 10 MG tablet TAKE (1) TABLET TWICE A DAY. MAY TAKE EXTRA DOSE FOR SBP >160 UP TO A TOTAL OF 4 TIMES A DAY.   isosorbide mononitrate (IMDUR) 60 MG 24 hr tablet TAKE 1 & 1/2 TABLETS A DAY.   Multiple Vitamins-Calcium (ONE-A-DAY WOMENS PO) 1 tablet, Oral, Daily   nitrofurantoin, macrocrystal-monohydrate, (MACROBID) 100 MG capsule Oral, Takes as Needed   nitroGLYCERIN (NITROSTAT) 0.4 MG SL tablet DISSOLVE 1 TABLET UNDER TONGUE  IF NEEDED FOR CHEST PAIN. MAY REPEAT IN 5 MINUTES FOR 3 DOSES.   ondansetron (ZOFRAN-ODT) 4 mg, Oral, Every 8 hours PRN   pantoprazole (PROTONIX) 40 mg, Oral, Daily   PRESCRIPTION MEDICATION Avastin eye injections given by Dr Vanessa Briley Sulton due to macular degeneration    rosuvastatin (CRESTOR) 10 MG tablet TABLET ONE-HALF TABLET BY MOUTH DAILY   SYNTHROID 50 MCG tablet TAKE 1 TABLET EACH DAY.     Patient Active Problem List   Diagnosis Date Noted   Gastritis 06/11/2022   Aortic atherosclerosis ([facility]) 12/07/2019   Macular degeneration 01/11/2019   Coronary artery disease involving native coronary artery of native heart without angina pectoris 10/29/2018   Presence of intraocular lens 09/23/2018   Bradycardia 11/11/2017   Dyslipidemia 11/11/2017   Pain in joint of right hip 08/04/2017   CAD (coronary artery disease) 06/12/2017   GERD (gastroesophageal reflux disease) 06/12/2017   S/P hip replacement, right 06/12/2017   Osteoarthritis of right hip 06/10/2017   CAD S/P percutaneous coronary angioplasty 02/11/2015   Renal insufficiency 02/11/2015   Prolonged Q-T interval on ECG 04/18/2010   Hypertension 08/11/2007   Hypothyroidism (acquired) 06/08/2007   MITRAL VALVE PROLAPSE 06/08/2007      Review of Systems  Gastrointestinal:  Positive for abdominal pain.  All other systems reviewed and are negative.     Objective:     BP Marland Kitchen)  102/58 (BP Location: Left Arm, Patient Position: Sitting, Cuff Size: Normal)   Pulse 69   Temp 98.2 F (36.8 C) (Oral)   Ht 5\' 3"  (1.6 m)   Wt 124 lb 6.4 oz (56.4 kg)   SpO2 94%   BMI 22.04 kg/m    Physical Exam Vitals reviewed.  Constitutional:      Appearance: Normal appearance. She is well-groomed and normal weight.  Eyes:     Conjunctiva/sclera: Conjunctivae normal.  Neck:     Thyroid: No thyromegaly.  Cardiovascular:     Rate and Rhythm: Normal rate and regular rhythm.     Pulses: Normal pulses.     Heart sounds: S1 normal and S2 normal.   Pulmonary:     Effort: Pulmonary effort is normal.     Breath sounds: Normal breath sounds and air entry.  Abdominal:     General: Bowel sounds are normal.  Musculoskeletal:     Right lower leg: No edema.     Left lower leg: No edema.  Neurological:     Mental Status: She is alert and oriented to person, place, and time. Mental status is at baseline.     Gait: Gait is intact.  Psychiatric:        Mood and Affect: Mood and affect normal.        Speech: Speech normal.        Behavior: Behavior normal.        Judgment: Judgment normal.      No results found for any visits on 06/11/22.    The ASCVD Risk score (Arnett DK, et al., 2019) failed to calculate for the following reasons:   The 2019 ASCVD risk score is only valid for ages 106 to 70    Assessment & Plan:   Problem List Items Addressed This Visit       Digestive   Gastritis - Primary    Patinet had 1 black stool 2 days ago after her symptoms started. Is having some epigastric tenderness on exam and burning. I suspect acute gastritis or possibly even a gastric ulcer. I advised that she start pantoprazole 40 mg BID for 1 week then if her symptoms are improved she can reduce to once daily. I also gave her more ondansetron 4 mg PRN every 8 hours for nausea. I advised that if she is not feeling better in a few days she should contact her GI doctor to get an appointment.       Relevant Medications   pantoprazole (PROTONIX) 40 MG tablet   ondansetron (ZOFRAN-ODT) 4 MG disintegrating tablet    No follow-ups on file.    78, MD

## 2022-06-11 NOTE — Patient Instructions (Addendum)
Take the pantoprazole twice a day for 1 week, then reduce to once a day. Can add pepcid once a day to the pantrpazole also.  If you do not feel better in the next few days (by Monday) please call your GI doctor and get an appt.

## 2022-06-12 ENCOUNTER — Telehealth: Payer: Self-pay | Admitting: Family Medicine

## 2022-06-12 ENCOUNTER — Other Ambulatory Visit: Payer: Self-pay

## 2022-06-12 ENCOUNTER — Encounter (HOSPITAL_BASED_OUTPATIENT_CLINIC_OR_DEPARTMENT_OTHER): Payer: Self-pay | Admitting: Emergency Medicine

## 2022-06-12 DIAGNOSIS — Z96641 Presence of right artificial hip joint: Secondary | ICD-10-CM | POA: Diagnosis present

## 2022-06-12 DIAGNOSIS — Q453 Other congenital malformations of pancreas and pancreatic duct: Secondary | ICD-10-CM

## 2022-06-12 DIAGNOSIS — K2980 Duodenitis without bleeding: Secondary | ICD-10-CM | POA: Diagnosis present

## 2022-06-12 DIAGNOSIS — E871 Hypo-osmolality and hyponatremia: Secondary | ICD-10-CM | POA: Diagnosis present

## 2022-06-12 DIAGNOSIS — Z803 Family history of malignant neoplasm of breast: Secondary | ICD-10-CM

## 2022-06-12 DIAGNOSIS — K859 Acute pancreatitis without necrosis or infection, unspecified: Principal | ICD-10-CM | POA: Diagnosis present

## 2022-06-12 DIAGNOSIS — E878 Other disorders of electrolyte and fluid balance, not elsewhere classified: Secondary | ICD-10-CM | POA: Diagnosis present

## 2022-06-12 DIAGNOSIS — Z801 Family history of malignant neoplasm of trachea, bronchus and lung: Secondary | ICD-10-CM

## 2022-06-12 DIAGNOSIS — Z8249 Family history of ischemic heart disease and other diseases of the circulatory system: Secondary | ICD-10-CM

## 2022-06-12 DIAGNOSIS — M810 Age-related osteoporosis without current pathological fracture: Secondary | ICD-10-CM | POA: Diagnosis present

## 2022-06-12 DIAGNOSIS — Z806 Family history of leukemia: Secondary | ICD-10-CM

## 2022-06-12 DIAGNOSIS — Z800 Family history of malignant neoplasm of digestive organs: Secondary | ICD-10-CM

## 2022-06-12 DIAGNOSIS — I2510 Atherosclerotic heart disease of native coronary artery without angina pectoris: Secondary | ICD-10-CM | POA: Diagnosis present

## 2022-06-12 DIAGNOSIS — Z833 Family history of diabetes mellitus: Secondary | ICD-10-CM

## 2022-06-12 DIAGNOSIS — K5900 Constipation, unspecified: Secondary | ICD-10-CM | POA: Diagnosis present

## 2022-06-12 DIAGNOSIS — I252 Old myocardial infarction: Secondary | ICD-10-CM

## 2022-06-12 DIAGNOSIS — K3580 Unspecified acute appendicitis: Secondary | ICD-10-CM | POA: Diagnosis present

## 2022-06-12 DIAGNOSIS — Z79899 Other long term (current) drug therapy: Secondary | ICD-10-CM

## 2022-06-12 DIAGNOSIS — Z888 Allergy status to other drugs, medicaments and biological substances status: Secondary | ICD-10-CM

## 2022-06-12 DIAGNOSIS — Z882 Allergy status to sulfonamides status: Secondary | ICD-10-CM

## 2022-06-12 DIAGNOSIS — M419 Scoliosis, unspecified: Secondary | ICD-10-CM | POA: Diagnosis present

## 2022-06-12 DIAGNOSIS — I10 Essential (primary) hypertension: Secondary | ICD-10-CM | POA: Diagnosis present

## 2022-06-12 DIAGNOSIS — K219 Gastro-esophageal reflux disease without esophagitis: Secondary | ICD-10-CM | POA: Diagnosis present

## 2022-06-12 DIAGNOSIS — E039 Hypothyroidism, unspecified: Secondary | ICD-10-CM | POA: Diagnosis present

## 2022-06-12 DIAGNOSIS — Z955 Presence of coronary angioplasty implant and graft: Secondary | ICD-10-CM

## 2022-06-12 DIAGNOSIS — Z79890 Hormone replacement therapy (postmenopausal): Secondary | ICD-10-CM

## 2022-06-12 DIAGNOSIS — E7800 Pure hypercholesterolemia, unspecified: Secondary | ICD-10-CM | POA: Diagnosis present

## 2022-06-12 DIAGNOSIS — Z8049 Family history of malignant neoplasm of other genital organs: Secondary | ICD-10-CM

## 2022-06-12 DIAGNOSIS — Z7982 Long term (current) use of aspirin: Secondary | ICD-10-CM

## 2022-06-12 LAB — COMPREHENSIVE METABOLIC PANEL
ALT: 44 U/L (ref 0–44)
AST: 49 U/L — ABNORMAL HIGH (ref 15–41)
Albumin: 3.6 g/dL (ref 3.5–5.0)
Alkaline Phosphatase: 83 U/L (ref 38–126)
Anion gap: 10 (ref 5–15)
BUN: 22 mg/dL (ref 8–23)
CO2: 25 mmol/L (ref 22–32)
Calcium: 8.9 mg/dL (ref 8.9–10.3)
Chloride: 95 mmol/L — ABNORMAL LOW (ref 98–111)
Creatinine, Ser: 0.91 mg/dL (ref 0.44–1.00)
GFR, Estimated: >60 mL/min (ref >60)
Glucose, Bld: 108 mg/dL — ABNORMAL HIGH (ref 70–99)
Potassium: 4.1 mmol/L (ref 3.5–5.1)
Sodium: 130 mmol/L — ABNORMAL LOW (ref 135–145)
Total Bilirubin: 0.5 mg/dL (ref 0.3–1.2)
Total Protein: 6.4 g/dL — ABNORMAL LOW (ref 6.5–8.1)

## 2022-06-12 LAB — URINALYSIS, ROUTINE W REFLEX MICROSCOPIC
Bilirubin Urine: NEGATIVE
Cellular Cast, UA: 1
Glucose, UA: NEGATIVE mg/dL
Hgb urine dipstick: NEGATIVE
Ketones, ur: NEGATIVE mg/dL
Nitrite: NEGATIVE
Specific Gravity, Urine: 1.013 (ref 1.005–1.030)
Trans Epithel, UA: <1
pH: 6 (ref 5.0–8.0)

## 2022-06-12 LAB — CBC
HCT: 38.2 % (ref 36.0–46.0)
Hemoglobin: 12.5 g/dL (ref 12.0–15.0)
MCH: 30.9 pg (ref 26.0–34.0)
MCHC: 32.7 g/dL (ref 30.0–36.0)
MCV: 94.3 fL (ref 80.0–100.0)
Platelets: 218 10*3/uL (ref 150–400)
RBC: 4.05 MIL/uL (ref 3.87–5.11)
RDW: 12.1 % (ref 11.5–15.5)
WBC: 10.7 10*3/uL — ABNORMAL HIGH (ref 4.0–10.5)
nRBC: 0 % (ref 0.0–0.2)

## 2022-06-12 LAB — LIPASE, BLOOD: Lipase: 30 U/L (ref 11–51)

## 2022-06-12 NOTE — ED Triage Notes (Signed)
Abdominal pains generalized (pelvic to ribs) Constipation. Last Bm Tuesday. Ongoing GI issues since September Bloated, passing some gas

## 2022-06-12 NOTE — Telephone Encounter (Signed)
Patient informed of the message below and stated she is taking Protonix.  Patient stated she has not had a bowel movement and agreed to go to the ER if she has any black stools.  Message sent to PCP.

## 2022-06-12 NOTE — Telephone Encounter (Signed)
Pt is calling and was seen on 06-11-2022 and per pt was told to call on Friday to let md know how she is doing. Pt has tried miralax for 2 days with no relief and she will call her GI on monday

## 2022-06-12 NOTE — Telephone Encounter (Signed)
Is she also taking the protonix that I prescribed her? If she has any more black stools then she should go to the ER for evaluation. I did tell her this in the visit I just wanted to be sure she seeks attention if her condition worsens.

## 2022-06-13 ENCOUNTER — Inpatient Hospital Stay (HOSPITAL_BASED_OUTPATIENT_CLINIC_OR_DEPARTMENT_OTHER)
Admission: EM | Admit: 2022-06-13 | Discharge: 2022-06-15 | DRG: 439 | Disposition: A | Discharge status: Home / Self Care | Payer: Medicare Other | Attending: Internal Medicine | Admitting: Internal Medicine

## 2022-06-13 ENCOUNTER — Observation Stay (HOSPITAL_COMMUNITY): Payer: Medicare Other

## 2022-06-13 ENCOUNTER — Emergency Department (HOSPITAL_BASED_OUTPATIENT_CLINIC_OR_DEPARTMENT_OTHER): Payer: Medicare Other

## 2022-06-13 DIAGNOSIS — K3580 Unspecified acute appendicitis: Secondary | ICD-10-CM | POA: Diagnosis not present

## 2022-06-13 DIAGNOSIS — Z806 Family history of leukemia: Secondary | ICD-10-CM | POA: Diagnosis not present

## 2022-06-13 DIAGNOSIS — E785 Hyperlipidemia, unspecified: Secondary | ICD-10-CM | POA: Diagnosis present

## 2022-06-13 DIAGNOSIS — Z955 Presence of coronary angioplasty implant and graft: Secondary | ICD-10-CM | POA: Diagnosis not present

## 2022-06-13 DIAGNOSIS — Z800 Family history of malignant neoplasm of digestive organs: Secondary | ICD-10-CM | POA: Diagnosis not present

## 2022-06-13 DIAGNOSIS — I10 Essential (primary) hypertension: Secondary | ICD-10-CM | POA: Diagnosis present

## 2022-06-13 DIAGNOSIS — R109 Unspecified abdominal pain: Secondary | ICD-10-CM | POA: Diagnosis present

## 2022-06-13 DIAGNOSIS — M419 Scoliosis, unspecified: Secondary | ICD-10-CM | POA: Diagnosis not present

## 2022-06-13 DIAGNOSIS — Z96641 Presence of right artificial hip joint: Secondary | ICD-10-CM | POA: Diagnosis not present

## 2022-06-13 DIAGNOSIS — E039 Hypothyroidism, unspecified: Secondary | ICD-10-CM | POA: Diagnosis not present

## 2022-06-13 DIAGNOSIS — E871 Hypo-osmolality and hyponatremia: Secondary | ICD-10-CM | POA: Diagnosis not present

## 2022-06-13 DIAGNOSIS — Z8249 Family history of ischemic heart disease and other diseases of the circulatory system: Secondary | ICD-10-CM | POA: Diagnosis not present

## 2022-06-13 DIAGNOSIS — R1012 Left upper quadrant pain: Secondary | ICD-10-CM

## 2022-06-13 DIAGNOSIS — I252 Old myocardial infarction: Secondary | ICD-10-CM | POA: Diagnosis not present

## 2022-06-13 DIAGNOSIS — Z801 Family history of malignant neoplasm of trachea, bronchus and lung: Secondary | ICD-10-CM | POA: Diagnosis not present

## 2022-06-13 DIAGNOSIS — K859 Acute pancreatitis without necrosis or infection, unspecified: Secondary | ICD-10-CM | POA: Diagnosis present

## 2022-06-13 DIAGNOSIS — I2510 Atherosclerotic heart disease of native coronary artery without angina pectoris: Secondary | ICD-10-CM | POA: Diagnosis not present

## 2022-06-13 DIAGNOSIS — Z833 Family history of diabetes mellitus: Secondary | ICD-10-CM | POA: Diagnosis not present

## 2022-06-13 DIAGNOSIS — E7800 Pure hypercholesterolemia, unspecified: Secondary | ICD-10-CM | POA: Diagnosis not present

## 2022-06-13 DIAGNOSIS — K219 Gastro-esophageal reflux disease without esophagitis: Secondary | ICD-10-CM | POA: Diagnosis present

## 2022-06-13 DIAGNOSIS — Z8049 Family history of malignant neoplasm of other genital organs: Secondary | ICD-10-CM | POA: Diagnosis not present

## 2022-06-13 DIAGNOSIS — E878 Other disorders of electrolyte and fluid balance, not elsewhere classified: Secondary | ICD-10-CM | POA: Diagnosis not present

## 2022-06-13 DIAGNOSIS — Q453 Other congenital malformations of pancreas and pancreatic duct: Secondary | ICD-10-CM | POA: Diagnosis not present

## 2022-06-13 DIAGNOSIS — K2980 Duodenitis without bleeding: Secondary | ICD-10-CM | POA: Diagnosis not present

## 2022-06-13 DIAGNOSIS — M810 Age-related osteoporosis without current pathological fracture: Secondary | ICD-10-CM | POA: Diagnosis not present

## 2022-06-13 DIAGNOSIS — K5900 Constipation, unspecified: Secondary | ICD-10-CM | POA: Diagnosis not present

## 2022-06-13 DIAGNOSIS — K8591 Acute pancreatitis with uninfected necrosis, unspecified: Principal | ICD-10-CM

## 2022-06-13 DIAGNOSIS — Z803 Family history of malignant neoplasm of breast: Secondary | ICD-10-CM | POA: Diagnosis not present

## 2022-06-13 LAB — CBC WITH DIFFERENTIAL/PLATELET
Abs Immature Granulocytes: 0.03 10*3/uL (ref 0.00–0.07)
Basophils Absolute: 0 10*3/uL (ref 0.0–0.1)
Basophils Relative: 0 %
Eosinophils Absolute: 0.1 10*3/uL (ref 0.0–0.5)
Eosinophils Relative: 1 %
HCT: 36.7 % (ref 36.0–46.0)
Hemoglobin: 11.9 g/dL — ABNORMAL LOW (ref 12.0–15.0)
Immature Granulocytes: 0 %
Lymphocytes Relative: 9 %
Lymphs Abs: 0.6 10*3/uL — ABNORMAL LOW (ref 0.7–4.0)
MCH: 30.8 pg (ref 26.0–34.0)
MCHC: 32.4 g/dL (ref 30.0–36.0)
MCV: 95.1 fL (ref 80.0–100.0)
Monocytes Absolute: 0.7 10*3/uL (ref 0.1–1.0)
Monocytes Relative: 10 %
Neutro Abs: 5.3 10*3/uL (ref 1.7–7.7)
Neutrophils Relative %: 80 %
Platelets: 214 10*3/uL (ref 150–400)
RBC: 3.86 MIL/uL — ABNORMAL LOW (ref 3.87–5.11)
RDW: 12 % (ref 11.5–15.5)
WBC: 6.7 10*3/uL (ref 4.0–10.5)
nRBC: 0 % (ref 0.0–0.2)

## 2022-06-13 LAB — COMPREHENSIVE METABOLIC PANEL
ALT: 32 U/L (ref 0–44)
AST: 27 U/L (ref 15–41)
Albumin: 3.1 g/dL — ABNORMAL LOW (ref 3.5–5.0)
Alkaline Phosphatase: 93 U/L (ref 38–126)
Anion gap: 8 (ref 5–15)
BUN: 13 mg/dL (ref 8–23)
CO2: 26 mmol/L (ref 22–32)
Calcium: 8.4 mg/dL — ABNORMAL LOW (ref 8.9–10.3)
Chloride: 105 mmol/L (ref 98–111)
Creatinine, Ser: 0.7 mg/dL (ref 0.44–1.00)
GFR, Estimated: >60 mL/min (ref >60)
Glucose, Bld: 93 mg/dL (ref 70–99)
Potassium: 3.8 mmol/L (ref 3.5–5.1)
Sodium: 139 mmol/L (ref 135–145)
Total Bilirubin: 0.6 mg/dL (ref 0.3–1.2)
Total Protein: 6.2 g/dL — ABNORMAL LOW (ref 6.5–8.1)

## 2022-06-13 MED ORDER — MORPHINE SULFATE (PF) 4 MG/ML IV SOLN
4.0000 mg | Freq: Once | INTRAVENOUS | Status: AC
  Administered 2022-06-13: 4 mg via INTRAVENOUS
  Filled 2022-06-13: qty 1

## 2022-06-13 MED ORDER — PROCHLORPERAZINE EDISYLATE 10 MG/2ML IJ SOLN
10.0000 mg | Freq: Four times a day (QID) | INTRAMUSCULAR | Status: DC | PRN
  Administered 2022-06-13 – 2022-06-14 (×2): 10 mg via INTRAVENOUS
  Filled 2022-06-13 (×2): qty 2

## 2022-06-13 MED ORDER — POLYVINYL ALCOHOL 1.4 % OP SOLN: 1.0000 [drp] | Freq: Every day | OPHTHALMIC | Status: DC | PRN

## 2022-06-13 MED ORDER — MORPHINE SULFATE (PF) 2 MG/ML IV SOLN: 2.0000 mg | INTRAVENOUS | Status: DC | PRN

## 2022-06-13 MED ORDER — SODIUM CHLORIDE 0.9 % IV BOLUS
1000.0000 mL | Freq: Once | INTRAVENOUS | Status: AC
  Administered 2022-06-13: 1000 mL via INTRAVENOUS

## 2022-06-13 MED ORDER — MORPHINE SULFATE (PF) 4 MG/ML IV SOLN
4.0000 mg | INTRAVENOUS | Status: DC | PRN
  Administered 2022-06-13 – 2022-06-14 (×4): 4 mg via INTRAVENOUS
  Filled 2022-06-13 (×4): qty 1

## 2022-06-13 MED ORDER — ONDANSETRON HCL 4 MG/2ML IJ SOLN
4.0000 mg | Freq: Once | INTRAMUSCULAR | Status: AC
  Administered 2022-06-13: 4 mg via INTRAVENOUS
  Filled 2022-06-13: qty 2

## 2022-06-13 MED ORDER — IOHEXOL 300 MG/ML  SOLN
100.0000 mL | Freq: Once | INTRAMUSCULAR | Status: AC | PRN
  Administered 2022-06-13: 100 mL via INTRAVENOUS

## 2022-06-13 MED ORDER — GADOBUTROL 1 MMOL/ML IV SOLN
5.5000 mL | Freq: Once | INTRAVENOUS | Status: AC | PRN
  Administered 2022-06-13: 5.5 mL via INTRAVENOUS

## 2022-06-13 MED ORDER — PANTOPRAZOLE SODIUM 40 MG IV SOLR
40.0000 mg | INTRAVENOUS | Status: DC
  Administered 2022-06-13: 40 mg via INTRAVENOUS
  Filled 2022-06-13: qty 10

## 2022-06-13 MED ORDER — SODIUM CHLORIDE 0.9 % IV SOLN: INTRAVENOUS | Status: DC

## 2022-06-13 MED ORDER — HYDRALAZINE HCL 20 MG/ML IJ SOLN: 10.0000 mg | Freq: Three times a day (TID) | INTRAMUSCULAR | Status: DC | PRN

## 2022-06-13 NOTE — Progress Notes (Signed)
Pt has arrived to Room 1504 from [facility]. Alert and oriented. VS WNL.

## 2022-06-13 NOTE — Progress Notes (Signed)
  [facility] will assume care on arrival to accepting facility. Until arrival, care as per EDP. However, [facility] available 24/7 for questions and assistance.   Nursing staff please page [facility] Admits and Consults ([phone]) as soon as the patient arrives to the hospital.  Mayo Owczarzak, DO [facility]  

## 2022-06-13 NOTE — H&P (Signed)
History and Physical    Patient: Kaitlyn Parks [id number] DOB: 1935/06/19 DOA: 06/13/2022 DOS: the patient was seen and examined on 06/13/2022 PCP: Farrel Conners, MD  Patient coming from: Home  Chief Complaint:  Chief Complaint  Patient presents with   Abdominal Pain   HPI: Kaitlyn Parks is a 87 y.o. female with medical history significant of HTN, CAD, HLD, GERD, hyponatremia. Presenting with abdominal pain, N/V. Her symptoms started 4 days ago. She says she ate a crab cake and became sick soon after. She didn't have any blood in her vomit. She hasn't had any fever. Her pain is mostly in the LUQ and LLQ. It feels like a burning sensation. She went to her PCP 2 days ago. She was given a PPI, miralax and zofran. However, these did not provide complete relief. When her symptoms did not improve this morning, she decided to come to the ED for evaluation. She denies any other aggravating or alleviating factors.    Review of Systems: As mentioned in the history of present illness. All other systems reviewed and are negative. Past Medical History:  Diagnosis Date   Arthritis    DDD, scoliosis, sees Dr. Arnoldo Morale for this, uses norco very rarely for pain   Bradycardia 11/11/2017   CAD (coronary artery disease)    LAD stenting of a 90% lesion 2012   Cystocele    Educated about COVID-19 virus infection 12/06/2019   Elevated cholesterol    GERD (gastroesophageal reflux disease)    dx in work up 2016 for atypical CP at OSH   pt. denies   Heart murmur    Hypertensive retinopathy    OU   Hypothyroidism    Interstitial cystitis    sees Dr. Jeffie Pollock   Macular degeneration    OU   NSTEMI (non-ST elevated myocardial infarction) [facility])    Osteoporosis    Rectocele    S/P hip replacement, right 06/12/2017   Scoliosis    Thyroid disease    Hypothyroid   Urinary incontinence    USI   Uterine prolapse    Past Surgical History:  Procedure Laterality Date   BLADDER SUSPENSION  2011    CATARACT EXTRACTION Bilateral 2015   Dr. Herbert Deaner   CORONARY ANGIOPLASTY WITH STENT PLACEMENT  04/21/2010   LAD 80%, RCA 30%, nl EF, s/p DES LAD   EYE SURGERY     LEFT HEART CATH AND CORONARY ANGIOGRAPHY N/A 06/14/2017   Procedure: LEFT HEART CATH AND CORONARY ANGIOGRAPHY;  Surgeon: Lorretta Harp, MD;  Location: [facility];  Service: Cardiovascular;  Laterality: N/A;   OOPHORECTOMY  2011   BSO   TOTAL HIP ARTHROPLASTY Right 06/10/2017   Procedure: RIGHT TOTAL HIP ARTHROPLASTY ANTERIOR APPROACH;  Surgeon: Rod Can, MD;  Location: [facility];  Service: Orthopedics;  Laterality: Right;  Needs RNFA   VAGINAL HYSTERECTOMY  2011   LAVH BSO; benign   Social History:  reports that she has never smoked. She has never used smokeless tobacco. She reports that she does not drink alcohol and does not use drugs.  Allergies  Allergen Reactions   Acyclovir And Related Other (See Comments)    unknown   Pravachol [Pravastatin Sodium] Other (See Comments)    cystitis   Sulfa Antibiotics     nausea   Zocor [Simvastatin] Other (See Comments)    cystitis    Family History  Problem Relation Age of Onset   Hypertension Mother    Heart disease Mother  Heart disease Father    Heart disease Sister    Diabetes Sister    Uterine cancer Sister        mets to lungs   Lung cancer Sister    Scoliosis Sister    Heart disease Brother    Colon cancer Paternal Aunt 9   Breast cancer Paternal 26        Age 22's   Breast cancer Paternal Aunt    Leukemia Paternal Aunt    Lung cancer Paternal Grandfather        smoker   Arthritis Daughter    Breast cancer Cousin        Maternal 1st cousins-Age 28's   Esophageal cancer Neg Hx    Pancreatic cancer Neg Hx    Stomach cancer Neg Hx     Prior to Admission medications   Medication Sig Start Date End Date Taking? Authorizing Provider  alum & mag hydroxide-simeth (MAALOX PLUS) 400-400-40 MG/5ML suspension Take 15 mLs by mouth every 6 (six)  hours as needed for indigestion. 04/23/22   Mesner, Corene Cornea, MD  amLODipine (NORVASC) 2.5 MG tablet Take 1 tablet (2.5 mg total) by mouth daily. 02/12/22   Minus Breeding, MD  aspirin EC 81 MG tablet Take 81 mg by mouth daily.    [provider]  Biotin w/ Vitamins C & E (HAIR/SKIN/NAILS PO) Take 1 tablet by mouth daily.    [provider]  Cranberry 400 MG CAPS Take 400 mg by mouth daily.    [provider]  hydrALAZINE (APRESOLINE) 10 MG tablet TAKE (1) TABLET TWICE A DAY. MAY TAKE EXTRA DOSE FOR SBP >160 UP TO A TOTAL OF 4 TIMES A DAY. Patient taking differently: TAKE (1) TABLET TWICE A DAY. MAY TAKE EXTRA DOSE FOR SBP >160 UP TO A TOTAL OF 4 TIMES A DAY. As Needed 10/17/21   Minus Breeding, MD  Multiple Vitamins-Calcium (ONE-A-DAY WOMENS PO) Take 1 tablet by mouth daily.     [provider]  nitrofurantoin, macrocrystal-monohydrate, (MACROBID) 100 MG capsule Take by mouth. Takes as Needed 12/06/20   [provider]  nitroGLYCERIN (NITROSTAT) 0.4 MG SL tablet DISSOLVE 1 TABLET UNDER TONGUE IF NEEDED FOR CHEST PAIN. MAY REPEAT IN 5 MINUTES FOR 3 DOSES. 10/06/21   Minus Breeding, MD  ondansetron (ZOFRAN-ODT) 4 MG disintegrating tablet Take 1 tablet (4 mg total) by mouth every 8 (eight) hours as needed for nausea or vomiting. 06/11/22   Farrel Conners, MD  pantoprazole (PROTONIX) 40 MG tablet Take 1 tablet (40 mg total) by mouth daily. 06/11/22   Farrel Conners, MD  PRESCRIPTION MEDICATION Avastin eye injections given by Dr Coralyn Pear due to macular degeneration    [provider]  rosuvastatin (CRESTOR) 10 MG tablet TABLET ONE-HALF TABLET BY MOUTH DAILY 10/17/21   Minus Breeding, MD  SYNTHROID 50 MCG tablet TAKE 1 TABLET EACH DAY. 04/22/22   Farrel Conners, MD    Physical Exam: Vitals:   06/13/22 0800 06/13/22 0830 06/13/22 1150 06/13/22 1418  BP: (!) 147/74 136/66 (!) 176/81 (!) 157/79  Pulse: 74 65 77 78  Resp: 16 16 16 18   Temp: 98 F  (36.7 C)  98.6 F (37 C) 98 F (36.7 C)  TempSrc: Oral  Oral Oral  SpO2: 96% 94% 97% 99%  Weight:    56.8 kg  Height:    5\' 3"  (1.6 m)   General: 87 y.o. female resting in bed in NAD Eyes: PERRL, normal sclera ENMT: Nares  patent w/o discharge, orophaynx clear, dentition normal, ears w/o discharge/lesions/ulcers Neck: Supple, trachea midline Cardiovascular: RRR, +S1, S2, no m/g/r, equal pulses throughout Respiratory: CTABL, no w/r/r, normal WOB GI: BS+, ND, epigastric/LUQ TTP, no masses noted, no organomegaly noted MSK: No e/c/c Neuro: A&O x 3, no focal deficits Psyc: Appropriate interaction and affect, calm/cooperative  Data Reviewed:  Results for orders placed or performed during the hospital encounter of 06/13/22 (from the past 24 hour(s))  Lipase, blood     Status: None   Collection Time: 06/12/22  9:01 PM  Result Value Ref Range   Lipase 30 11 - 51 U/L  Comprehensive metabolic panel     Status: Abnormal   Collection Time: 06/12/22  9:01 PM  Result Value Ref Range   Sodium 130 (L) 135 - 145 mmol/L   Potassium 4.1 3.5 - 5.1 mmol/L   Chloride 95 (L) 98 - 111 mmol/L   CO2 25 22 - 32 mmol/L   Glucose, Bld 108 (H) 70 - 99 mg/dL   BUN 22 8 - 23 mg/dL   Creatinine, Ser 0.91 0.44 - 1.00 mg/dL   Calcium 8.9 8.9 - 10.3 mg/dL   Total Protein 6.4 (L) 6.5 - 8.1 g/dL   Albumin 3.6 3.5 - 5.0 g/dL   AST 49 (H) 15 - 41 U/L   ALT 44 0 - 44 U/L   Alkaline Phosphatase 83 38 - 126 U/L   Total Bilirubin 0.5 0.3 - 1.2 mg/dL   GFR, Estimated >60 >60 mL/min   Anion gap 10 5 - 15  CBC     Status: Abnormal   Collection Time: 06/12/22  9:01 PM  Result Value Ref Range   WBC 10.7 (H) 4.0 - 10.5 K/uL   RBC 4.05 3.87 - 5.11 MIL/uL   Hemoglobin 12.5 12.0 - 15.0 g/dL   HCT 38.2 36.0 - 46.0 %   MCV 94.3 80.0 - 100.0 fL   MCH 30.9 26.0 - 34.0 pg   MCHC 32.7 30.0 - 36.0 g/dL   RDW 12.1 11.5 - 15.5 %   Platelets 218 150 - 400 K/uL   nRBC 0.0 0.0 - 0.2 %  Urinalysis, Routine w reflex  microscopic     Status: Abnormal   Collection Time: 06/12/22  9:01 PM  Result Value Ref Range   Color, Urine YELLOW YELLOW   APPearance CLEAR CLEAR   Specific Gravity, Urine 1.013 1.005 - 1.030   pH 6.0 5.0 - 8.0   Glucose, UA NEGATIVE NEGATIVE mg/dL   Hgb urine dipstick NEGATIVE NEGATIVE   Bilirubin Urine NEGATIVE NEGATIVE   Ketones, ur NEGATIVE NEGATIVE mg/dL   Protein, ur TRACE (A) NEGATIVE mg/dL   Nitrite NEGATIVE NEGATIVE   Leukocytes,Ua MODERATE (A) NEGATIVE   WBC, UA 6-10 0 - 5 WBC/hpf   Squamous Epithelial / LPF 0-5 0 - 5   Trans Epithel, UA <1    Mucus PRESENT    Hyaline Casts, UA PRESENT    Cellular Cast, UA 1    CT ab/pelvis 1. Findings suggestive of acute pancreatitis. There is mild dilatation of the pancreatic duct and a hypodense region at the pancreatic head, possible cysts, necrosis, or underlying lesion. MRI is recommended for further evaluation following resolution of acute inflammatory changes. 2. Thickening of the walls of the gastric antrum and proximal duodenum, suggesting gastritis and duodenitis. 3. Trace right pleural effusion with atelectasis at the lung bases bilaterally. 4. Aortic atherosclerosis.  Assessment and Plan: Acute pancreatitis     - placed  in obs, med-surg     - fluids, anti-emetics, pain control     - MRCP pendings; depending on results, may need LBGI consult  HTN     - continue home regimen when off NPO status     - PRN hydralazine  CAD HLD     - continue home regimen when off NPO status  GERD     - PPI  Hypothyroidism     - continue home regimen when off NPO status  Hyponatremia     - mild, fluids, trend  Advance Care Planning:   Code Status: FULL  Consults: None  Family Communication: None at bedside  Severity of Illness: The appropriate patient status for this patient is OBSERVATION. Observation status is judged to be reasonable and necessary in order to provide the required intensity of service to ensure the  patient's safety. The patient's presenting symptoms, physical exam findings, and initial radiographic and laboratory data in the context of their medical condition is felt to place them at decreased risk for further clinical deterioration. Furthermore, it is anticipated that the patient will be medically stable for discharge from the hospital within 2 midnights of admission.   Author: Teddy Spike, DO 06/13/2022 2:41 PM  For on call review [email or web address].

## 2022-06-13 NOTE — ED Notes (Signed)
She ambulates ad lib without difficulty. She tells me she is "comfortable for now". She is aware that we are awaiting a bed at [facility].

## 2022-06-13 NOTE — ED Notes (Signed)
I have just given report to Toniann Fail, Charity fundraiser at [employer].

## 2022-06-13 NOTE — ED Provider Notes (Signed)
[facility] EMERGENCY DEPT Provider Note   CSN: [id number] Arrival date & time: 06/12/22  2040     History  Chief Complaint  Patient presents with   Abdominal Pain    Kaitlyn Parks is a 87 y.o. female.  HPI     This is an 87 year old female who presents with abdominal pain.  Patient reports that she has had ongoing abdominal discomfort since Tuesday.  She reports on Tuesday she had a large dark bowel movement and nausea and vomiting.  She states that she has had worsening abdominal pain over the last several days.  She has been able to eat but has mostly been eating pured foods such as applesauce.  Tonight she had intense upper abdominal pain and bloating.  She reports that she is passing gas.  No prior surgical history.  Home Medications Prior to Admission medications   Medication Sig Start Date End Date Taking? Authorizing Provider  alum & mag hydroxide-simeth (MAALOX PLUS) 400-400-40 MG/5ML suspension Take 15 mLs by mouth every 6 (six) hours as needed for indigestion. 04/23/22   Mesner, Barbara Cower, MD  amLODipine (NORVASC) 2.5 MG tablet Take 1 tablet (2.5 mg total) by mouth daily. 02/12/22   Rollene Rotunda, MD  aspirin EC 81 MG tablet Take 81 mg by mouth daily.    [provider]  Biotin w/ Vitamins C & E (HAIR/SKIN/NAILS PO) Take 1 tablet by mouth daily.    [provider]  Cranberry 400 MG CAPS Take 400 mg by mouth daily.    [provider]  hydrALAZINE (APRESOLINE) 10 MG tablet TAKE (1) TABLET TWICE A DAY. MAY TAKE EXTRA DOSE FOR SBP >160 UP TO A TOTAL OF 4 TIMES A DAY. Patient taking differently: TAKE (1) TABLET TWICE A DAY. MAY TAKE EXTRA DOSE FOR SBP >160 UP TO A TOTAL OF 4 TIMES A DAY. As Needed 10/17/21   Rollene Rotunda, MD  Multiple Vitamins-Calcium (ONE-A-DAY WOMENS PO) Take 1 tablet by mouth daily.     [provider]  nitrofurantoin, macrocrystal-monohydrate, (MACROBID) 100 MG capsule Take by mouth. Takes as Needed 12/06/20    [provider]  nitroGLYCERIN (NITROSTAT) 0.4 MG SL tablet DISSOLVE 1 TABLET UNDER TONGUE IF NEEDED FOR CHEST PAIN. MAY REPEAT IN 5 MINUTES FOR 3 DOSES. 10/06/21   Rollene Rotunda, MD  ondansetron (ZOFRAN-ODT) 4 MG disintegrating tablet Take 1 tablet (4 mg total) by mouth every 8 (eight) hours as needed for nausea or vomiting. 06/11/22   Karie Georges, MD  pantoprazole (PROTONIX) 40 MG tablet Take 1 tablet (40 mg total) by mouth daily. 06/11/22   Karie Georges, MD  PRESCRIPTION MEDICATION Avastin eye injections given by Dr Vanessa Barbara due to macular degeneration    [provider]  rosuvastatin (CRESTOR) 10 MG tablet TABLET ONE-HALF TABLET BY MOUTH DAILY 10/17/21   Rollene Rotunda, MD  SYNTHROID 50 MCG tablet TAKE 1 TABLET EACH DAY. 04/22/22   Karie Georges, MD      Allergies    Acyclovir and related, Pravachol [pravastatin sodium], Sulfa antibiotics, and Zocor [simvastatin]    Review of Systems   Review of Systems  Constitutional:  Negative for fever.  Respiratory:  Negative for shortness of breath.   Cardiovascular:  Negative for chest pain.  Gastrointestinal:  Positive for abdominal pain, constipation, nausea and vomiting.  All other systems reviewed and are negative.   Physical Exam Updated Vital Signs BP (!) 153/68   Pulse 72   Temp 98.4 F (36.9 C) (  Oral)   Resp 16   SpO2 98%  Physical Exam Vitals and nursing note reviewed.  Constitutional:      Appearance: She is well-developed.     Comments: Uncomfortable appearing, nontoxic  HENT:     Head: Normocephalic and atraumatic.  Eyes:     Pupils: Pupils are equal, round, and reactive to light.  Cardiovascular:     Rate and Rhythm: Normal rate and regular rhythm.     Heart sounds: Normal heart sounds.  Pulmonary:     Effort: Pulmonary effort is normal. No respiratory distress.     Breath sounds: No wheezing.  Abdominal:     General: Bowel sounds are increased.     Palpations: Abdomen is soft.      Tenderness: There is abdominal tenderness in the epigastric area and periumbilical area. There is rebound. There is no guarding.  Musculoskeletal:     Cervical back: Neck supple.  Skin:    General: Skin is warm and dry.  Neurological:     Mental Status: She is alert and oriented to person, place, and time.  Psychiatric:        Mood and Affect: Mood normal.     ED Results / Procedures / Treatments   Labs (all labs ordered are listed, but only abnormal results are displayed) Labs Reviewed  COMPREHENSIVE METABOLIC PANEL - Abnormal; Notable for the following components:      Result Value   Sodium 130 (*)    Chloride 95 (*)    Glucose, Bld 108 (*)    Total Protein 6.4 (*)    AST 49 (*)    All other components within normal limits  CBC - Abnormal; Notable for the following components:   WBC 10.7 (*)    All other components within normal limits  URINALYSIS, ROUTINE W REFLEX MICROSCOPIC - Abnormal; Notable for the following components:   Protein, ur TRACE (*)    Leukocytes,Ua MODERATE (*)    All other components within normal limits  LIPASE, BLOOD    EKG None  Radiology CT ABDOMEN PELVIS W CONTRAST  Result Date: 06/13/2022 CLINICAL DATA:  Abdominal pain, constipation, and bloating. EXAM: CT ABDOMEN AND PELVIS WITH CONTRAST TECHNIQUE: Multidetector CT imaging of the abdomen and pelvis was performed using the standard protocol following bolus administration of intravenous contrast. RADIATION DOSE REDUCTION: This exam was performed according to the departmental dose-optimization program which includes automated exposure control, adjustment of the mA and/or kV according to patient size and/or use of iterative reconstruction technique. CONTRAST:  OMNIPAQUE IOHEXOL 300 MG/ML  SOLN COMPARISON:  04/23/2022. FINDINGS: Lower chest: Mild atelectasis at the lung bases. Trace right pleural effusion. Hepatobiliary: No focal liver abnormality is seen. No gallstones, gallbladder wall  thickening, or biliary dilatation. Pancreas: Peripancreatic fat stranding and surrounding inflammatory changes noted at the pancreatic head and body. There is heterogeneous enhancement at the pancreatic head with a 1.3 cm hypodense region, best seen on coronal image 52. There is mild dilatation of the pancreatic duct measuring 5 mm. Spleen: Normal in size without focal abnormality. Adrenals/Urinary Tract: The adrenal glands are within normal limits. The kidneys enhance symmetrically. A punctate nonobstructive calcification is noted on the right. Extrarenal pelvises are present bilaterally. The visualized portion of the urinary bladder is unremarkable. Stomach/Bowel: There is thickening of the walls of the gastric antrum. There is mild thickening of the walls of the second and third portions of the duodenum with surrounding inflammatory changes. No bowel obstruction, free air, or pneumatosis.  A few scattered diverticula are noted along the colon without evidence of diverticulitis. A normal appendix is present in the right lower quadrant. A moderate amount of retained stool is present in the ascending and transverse colon. Vascular/Lymphatic: Aortic atherosclerosis. No enlarged abdominal or pelvic lymph nodes. The portal vein, splenic vein, and superior mesenteric vein are patent. Reproductive: Status post hysterectomy. No adnexal masses. Other: Fat containing umbilical hernia. Musculoskeletal: Total hip arthroplasty changes are noted on the right. There are degenerative changes at the left hip and thoracolumbar spine. No acute osseous abnormality. IMPRESSION: 1. Findings suggestive of acute pancreatitis. There is mild dilatation of the pancreatic duct and a hypodense region at the pancreatic head, possible cysts, necrosis, or underlying lesion. MRI is recommended for further evaluation following resolution of acute inflammatory changes. 2. Thickening of the walls of the gastric antrum and proximal duodenum,  suggesting gastritis and duodenitis. 3. Trace right pleural effusion with atelectasis at the lung bases bilaterally. 4. Aortic atherosclerosis. Electronically Signed   By: Thornell Sartorius M.D.   On: 06/13/2022 03:56    Procedures Procedures    Medications Ordered in ED Medications  morphine (PF) 4 MG/ML injection 4 mg (4 mg Intravenous Given 06/13/22 0310)  ondansetron (ZOFRAN) injection 4 mg (4 mg Intravenous Given 06/13/22 0310)  iohexol (OMNIPAQUE) 300 MG/ML solution 100 mL (100 mLs Intravenous Contrast Given 06/13/22 0307)    ED Course/ Medical Decision Making/ A&P                           Medical Decision Making Amount and/or Complexity of Data Reviewed Labs: ordered. Radiology: ordered.  Risk Prescription drug management. Decision regarding hospitalization.   This patient presents to the ED for concern of abdominal pain, this involves an extensive number of treatment options, and is a complaint that carries with it a high risk of complications and morbidity.  I considered the following differential and admission for this acute, potentially life threatening condition.  The differential diagnosis includes SBO, constipation, pancreatitis, cholecystitis, appendicitis  MDM:    This is an 87 year old female who presents with several days of worsening abdominal pain.  She relates this to constipation.  She has not had a bowel movement since Tuesday.  She is overall uncomfortable appearing but nontoxic.  Vital signs notable for blood pressure of 153/68.  She has diffuse tenderness to palpation.  She has increased bowel sounds.  Makes me question SBO although she denies any surgical history.  Labs obtained and reviewed.  Mild hyponatremia at 130 with mild hypochloremia.  She was given fluids, pain, nausea medication.  She has a slight leukocytosis to 10.7.  Urinalysis not consistent with infection.  CT abdomen pelvis obtained.  This is concerning for acute pancreatitis.  She has  inflammation around her pancreas, gastric antrum and duodenum.  She also has dilation of the pancreatic duct and hypodense region of the pancreatic head consistent with possible cysts versus necrosis versus underlying lesion.  On recheck, she states she feels somewhat better with pain medication.  Given CT findings, feel it reasonable to admit for pain control and fluids.  She will need MRI for further evaluation of this pancreatic hypodense region.  Patient is agreeable to plan.  (Labs, imaging, consults)  Labs: I Ordered, and personally interpreted labs.  The pertinent results include: CBC, CMP, lipase, urinalysis  Imaging Studies ordered: I ordered imaging studies including CT abdomen pelvis I independently visualized and interpreted imaging. I agree with  the radiologist interpretation  Additional history obtained from friend at bedside.  External records from outside source obtained and reviewed including prior evaluations  Cardiac Monitoring: The patient was maintained on a cardiac monitor.  I personally viewed and interpreted the cardiac monitored which showed an underlying rhythm of: Sinus rhythm  Reevaluation: After the interventions noted above, I reevaluated the patient and found that they have :improved  Social Determinants of Health:  widowed  Disposition: Admit  Co morbidities that complicate the patient evaluation  Past Medical History:  Diagnosis Date   Arthritis    DDD, scoliosis, sees Dr. Lovell SheehanJenkins for this, uses norco very rarely for pain   Bradycardia 11/11/2017   CAD (coronary artery disease)    LAD stenting of a 90% lesion 2012   Cystocele    Educated about COVID-19 virus infection 12/06/2019   Elevated cholesterol    GERD (gastroesophageal reflux disease)    dx in work up 2016 for atypical CP at OSH   pt. denies   Heart murmur    Hypertensive retinopathy    OU   Hypothyroidism    Interstitial cystitis    sees Dr. Annabell HowellsWrenn   Macular degeneration    OU    NSTEMI (non-ST elevated myocardial infarction) (HCC)    Osteoporosis    Rectocele    S/P hip replacement, right 06/12/2017   Scoliosis    Thyroid disease    Hypothyroid   Urinary incontinence    USI   Uterine prolapse      Medicines Meds ordered this encounter  Medications   morphine (PF) 4 MG/ML injection 4 mg   ondansetron (ZOFRAN) injection 4 mg   iohexol (OMNIPAQUE) 300 MG/ML solution 100 mL    I have reviewed the patients home medicines and have made adjustments as needed  Problem List / ED Course: Problem List Items Addressed This Visit   None Visit Diagnoses     Acute pancreatitis with uninfected necrosis, unspecified pancreatitis type    -  Primary   Relevant Medications   morphine (PF) 4 MG/ML injection 4 mg (Completed)                   Final Clinical Impression(s) / ED Diagnoses Final diagnoses:  Acute pancreatitis with uninfected necrosis, unspecified pancreatitis type    Rx / DC Orders ED Discharge Orders     None         Shon BatonHorton, Jermayne Sweeney F, MD 06/13/22 [phone]

## 2022-06-14 DIAGNOSIS — I252 Old myocardial infarction: Secondary | ICD-10-CM | POA: Diagnosis not present

## 2022-06-14 DIAGNOSIS — K859 Acute pancreatitis without necrosis or infection, unspecified: Secondary | ICD-10-CM | POA: Diagnosis present

## 2022-06-14 DIAGNOSIS — K8500 Idiopathic acute pancreatitis without necrosis or infection: Secondary | ICD-10-CM | POA: Diagnosis not present

## 2022-06-14 DIAGNOSIS — K5900 Constipation, unspecified: Secondary | ICD-10-CM | POA: Diagnosis present

## 2022-06-14 DIAGNOSIS — M419 Scoliosis, unspecified: Secondary | ICD-10-CM | POA: Diagnosis present

## 2022-06-14 DIAGNOSIS — Z8049 Family history of malignant neoplasm of other genital organs: Secondary | ICD-10-CM | POA: Diagnosis not present

## 2022-06-14 DIAGNOSIS — I10 Essential (primary) hypertension: Secondary | ICD-10-CM | POA: Diagnosis present

## 2022-06-14 DIAGNOSIS — K219 Gastro-esophageal reflux disease without esophagitis: Secondary | ICD-10-CM | POA: Diagnosis present

## 2022-06-14 DIAGNOSIS — Z803 Family history of malignant neoplasm of breast: Secondary | ICD-10-CM | POA: Diagnosis not present

## 2022-06-14 DIAGNOSIS — Z833 Family history of diabetes mellitus: Secondary | ICD-10-CM | POA: Diagnosis not present

## 2022-06-14 DIAGNOSIS — Z801 Family history of malignant neoplasm of trachea, bronchus and lung: Secondary | ICD-10-CM | POA: Diagnosis not present

## 2022-06-14 DIAGNOSIS — Q453 Other congenital malformations of pancreas and pancreatic duct: Secondary | ICD-10-CM | POA: Diagnosis not present

## 2022-06-14 DIAGNOSIS — I2510 Atherosclerotic heart disease of native coronary artery without angina pectoris: Secondary | ICD-10-CM | POA: Diagnosis present

## 2022-06-14 DIAGNOSIS — E039 Hypothyroidism, unspecified: Secondary | ICD-10-CM | POA: Diagnosis present

## 2022-06-14 DIAGNOSIS — E871 Hypo-osmolality and hyponatremia: Secondary | ICD-10-CM | POA: Diagnosis present

## 2022-06-14 DIAGNOSIS — Z96641 Presence of right artificial hip joint: Secondary | ICD-10-CM | POA: Diagnosis present

## 2022-06-14 DIAGNOSIS — E7800 Pure hypercholesterolemia, unspecified: Secondary | ICD-10-CM | POA: Diagnosis present

## 2022-06-14 DIAGNOSIS — Z8249 Family history of ischemic heart disease and other diseases of the circulatory system: Secondary | ICD-10-CM | POA: Diagnosis not present

## 2022-06-14 DIAGNOSIS — K3580 Unspecified acute appendicitis: Secondary | ICD-10-CM | POA: Diagnosis present

## 2022-06-14 DIAGNOSIS — Z800 Family history of malignant neoplasm of digestive organs: Secondary | ICD-10-CM | POA: Diagnosis not present

## 2022-06-14 DIAGNOSIS — Z955 Presence of coronary angioplasty implant and graft: Secondary | ICD-10-CM | POA: Diagnosis not present

## 2022-06-14 DIAGNOSIS — Z806 Family history of leukemia: Secondary | ICD-10-CM | POA: Diagnosis not present

## 2022-06-14 DIAGNOSIS — E878 Other disorders of electrolyte and fluid balance, not elsewhere classified: Secondary | ICD-10-CM | POA: Diagnosis present

## 2022-06-14 DIAGNOSIS — K2980 Duodenitis without bleeding: Secondary | ICD-10-CM | POA: Diagnosis present

## 2022-06-14 DIAGNOSIS — M810 Age-related osteoporosis without current pathological fracture: Secondary | ICD-10-CM | POA: Diagnosis present

## 2022-06-14 LAB — COMPREHENSIVE METABOLIC PANEL
ALT: 32 U/L (ref 0–44)
AST: 30 U/L (ref 15–41)
Albumin: 3.2 g/dL — ABNORMAL LOW (ref 3.5–5.0)
Alkaline Phosphatase: 104 U/L (ref 38–126)
Anion gap: 10 (ref 5–15)
BUN: 12 mg/dL (ref 8–23)
CO2: 25 mmol/L (ref 22–32)
Calcium: 8.5 mg/dL — ABNORMAL LOW (ref 8.9–10.3)
Chloride: 102 mmol/L (ref 98–111)
Creatinine, Ser: 0.7 mg/dL (ref 0.44–1.00)
GFR, Estimated: >60 mL/min (ref >60)
Glucose, Bld: 76 mg/dL (ref 70–99)
Potassium: 3.4 mmol/L — ABNORMAL LOW (ref 3.5–5.1)
Sodium: 137 mmol/L (ref 135–145)
Total Bilirubin: 0.9 mg/dL (ref 0.3–1.2)
Total Protein: 6.4 g/dL — ABNORMAL LOW (ref 6.5–8.1)

## 2022-06-14 LAB — CBC
HCT: 41.9 % (ref 36.0–46.0)
Hemoglobin: 13 g/dL (ref 12.0–15.0)
MCH: 30.6 pg (ref 26.0–34.0)
MCHC: 31 g/dL (ref 30.0–36.0)
MCV: 98.6 fL (ref 80.0–100.0)
Platelets: 274 10*3/uL (ref 150–400)
RBC: 4.25 MIL/uL (ref 3.87–5.11)
RDW: 11.9 % (ref 11.5–15.5)
WBC: 6.3 10*3/uL (ref 4.0–10.5)
nRBC: 0 % (ref 0.0–0.2)

## 2022-06-14 MED ORDER — PANTOPRAZOLE SODIUM 40 MG PO TBEC
40.0000 mg | DELAYED_RELEASE_TABLET | Freq: Every day | ORAL | Status: DC
  Administered 2022-06-14 – 2022-06-15 (×2): 40 mg via ORAL
  Filled 2022-06-14 (×2): qty 1

## 2022-06-14 MED ORDER — POTASSIUM CHLORIDE CRYS ER 20 MEQ PO TBCR
20.0000 meq | EXTENDED_RELEASE_TABLET | Freq: Two times a day (BID) | ORAL | Status: DC
  Administered 2022-06-14 – 2022-06-15 (×3): 20 meq via ORAL
  Filled 2022-06-14 (×3): qty 1

## 2022-06-14 MED ORDER — ISOSORBIDE MONONITRATE ER 60 MG PO TB24
90.0000 mg | ORAL_TABLET | Freq: Every day | ORAL | Status: DC
  Administered 2022-06-14 – 2022-06-15 (×2): 90 mg via ORAL
  Filled 2022-06-14 (×2): qty 1

## 2022-06-14 MED ORDER — ASPIRIN 81 MG PO TBEC
81.0000 mg | DELAYED_RELEASE_TABLET | Freq: Every day | ORAL | Status: DC
  Administered 2022-06-14 – 2022-06-15 (×2): 81 mg via ORAL
  Filled 2022-06-14 (×2): qty 1

## 2022-06-14 MED ORDER — LEVOTHYROXINE SODIUM 50 MCG PO TABS
50.0000 ug | ORAL_TABLET | Freq: Every day | ORAL | Status: DC
  Administered 2022-06-15: 50 ug via ORAL
  Filled 2022-06-14: qty 1

## 2022-06-14 MED ORDER — HYDRALAZINE HCL 25 MG PO TABS
25.0000 mg | ORAL_TABLET | Freq: Two times a day (BID) | ORAL | Status: DC
  Administered 2022-06-14 – 2022-06-15 (×3): 25 mg via ORAL
  Filled 2022-06-14 (×3): qty 1

## 2022-06-14 MED ORDER — AMLODIPINE BESYLATE 5 MG PO TABS
2.5000 mg | ORAL_TABLET | Freq: Every day | ORAL | Status: DC
  Administered 2022-06-14 – 2022-06-15 (×2): 2.5 mg via ORAL
  Filled 2022-06-14 (×2): qty 1

## 2022-06-14 NOTE — Progress Notes (Signed)
PROGRESS NOTE    Kaitlyn Parks  VOZ:[id number] DOB: 1935-03-10 DOA: 06/13/2022 PCP: Karie Georges, MD    Brief Narrative:  87 year old with history of hypertension, coronary artery disease and hyperlipidemia, GERD and hyponatremia presented with multiple days of nausea vomiting and epigastric abdominal pain that she attributed to eating a crab cake.  Seen at primary care physician's office, started on PPI and MiraLAX without relief.  In the emergency room hemodynamically stable.  She was found to have pancreatitis emergency room without complications.  Admitted due to significant symptoms.   Assessment & Plan:   Acute appendicitis and duodenitis: Primary cause unknown.  First episode the patient has GI symptoms in the past. MRI as well as recent right upper quadrant ultrasound with no evidence of gallbladder disease.  Patient did not drink alcohol. MRCP consistent with diffuse pancreatitis of the pancreatic head, without complications.  Pancreatic divisum.  Also found to have multiseptated cystic lesion at the pancreatic head that is 1.8 x 1.8 cm in size.  Radiologist was able to address it to similar size septated cyst on CT scan in 2018.  Likely benign process as it is not grown in last 5 years.  Structural abnormality may be contributing to pancreatitis. Lipase was 36.  Patient has slightly stabilized now.  Wants to try some liquids.  Challenge with clear liquid diet.  Continue IV fluids.  Continue IV abdominal pain medications as needed.  Mobilize.  Recheck electrolytes tomorrow morning.  Chronic medical history significant For essential hypertension, blood pressure elevated.  Resume home medications Hyperlipidemia, continue to hold statin. GERD, PPI.  Change from IV to oral. Hypothyroidism: Resume home dose of Synthroid.    DVT prophylaxis: SCDs Start: 06/13/22 1601   Code Status: Full code Family Communication: Daughter on the phone Disposition Plan: Status is:  Inpatient Remains inpatient appropriate because: Unable to eat regular food, on IV fluids     Consultants:  None  Procedures:  None  Antimicrobials:  None   Subjective: Patient seen and examined.  Feels hungry.  Wants to try some meal.  We will start with clears.  Still has some nausea feeling of she thinks she should try some liquids. Objective: Vitals:   06/13/22 1816 06/13/22 2215 06/14/22 0203 06/14/22 0533  BP: (!) 148/78 (!) 167/72 (!) 169/96 (!) 172/73  Pulse: 76 83 82 75  Resp: 18 19 16 18   Temp: 98.8 F (37.1 C) 98.7 F (37.1 C) 100 F (37.8 C) 98.3 F (36.8 C)  TempSrc: Oral Oral Oral Oral  SpO2: 98% 94% 95% 94%  Weight:      Height:        Intake/Output Summary (Last 24 hours) at 06/14/2022 1132 Last data filed at 06/14/2022 1100 Gross per 24 hour  Intake 1280 ml  Output 1 ml  Net 1279 ml   Filed Weights   06/13/22 1418  Weight: 56.8 kg    Examination:  General exam: Appears calm and comfortable at rest.  Appropriately anxious. Respiratory system: Clear to auscultation. Respiratory effort normal. Cardiovascular system: S1 & S2 heard, RRR. No JVD, murmurs, rubs, gallops or clicks. No pedal edema. Gastrointestinal system: Soft.  Mild Tenderness along the epigastrium but no rigidity or guarding.  Central nervous system: Alert and oriented. No focal neurological deficits. Extremities: Symmetric 5 x 5 power. Skin: No rashes, lesions or ulcers Psychiatry: Judgement and insight appear normal. Mood & affect appropriate.     Data Reviewed: I have personally reviewed following labs and imaging  studies  CBC: Recent Labs  Lab 06/12/22 2101 06/13/22 1847 06/14/22 0606  WBC 10.7* 6.7 6.3  NEUTROABS  --  5.3  --   HGB 12.5 11.9* 13.0  HCT 38.2 36.7 41.9  MCV 94.3 95.1 98.6  PLT 218 214 274   Basic Metabolic Panel: Recent Labs  Lab 06/12/22 2101 06/13/22 1847 06/14/22 0606  NA 130* 139 137  K 4.1 3.8 3.4*  CL 95* 105 102  CO2 25 26 25    GLUCOSE 108* 93 76  BUN 22 13 12   CREATININE 0.91 0.70 0.70  CALCIUM 8.9 8.4* 8.5*   GFR: Estimated Creatinine Clearance: 42.5 mL/min (by C-G formula based on SCr of 0.7 mg/dL). Liver Function Tests: Recent Labs  Lab 06/12/22 2101 06/13/22 1847 06/14/22 0606  AST 49* 27 30  ALT 44 32 32  ALKPHOS 83 93 104  BILITOT 0.5 0.6 0.9  PROT 6.4* 6.2* 6.4*  ALBUMIN 3.6 3.1* 3.2*   Recent Labs  Lab 06/12/22 2101  LIPASE 30   No results for input(s): "AMMONIA" in the last 168 hours. Coagulation Profile: No results for input(s): "INR", "PROTIME" in the last 168 hours. Cardiac Enzymes: No results for input(s): "CKTOTAL", "CKMB", "CKMBINDEX", "TROPONINI" in the last 168 hours. BNP (last 3 results) No results for input(s): "PROBNP" in the last 8760 hours. HbA1C: No results for input(s): "HGBA1C" in the last 72 hours. CBG: No results for input(s): "GLUCAP" in the last 168 hours. Lipid Profile: No results for input(s): "CHOL", "HDL", "LDLCALC", "TRIG", "CHOLHDL", "LDLDIRECT" in the last 72 hours. Thyroid Function Tests: No results for input(s): "TSH", "T4TOTAL", "FREET4", "T3FREE", "THYROIDAB" in the last 72 hours. Anemia Panel: No results for input(s): "VITAMINB12", "FOLATE", "FERRITIN", "TIBC", "IRON", "RETICCTPCT" in the last 72 hours. Sepsis Labs: No results for input(s): "PROCALCITON", "LATICACIDVEN" in the last 168 hours.  No results found for this or any previous visit (from the past 240 hour(s)).       Radiology Studies: MR ABDOMEN MRCP W WO CONTAST  Result Date: 06/13/2022 CLINICAL DATA:  Pancreatitis suspected, abdominal pain, abnormal CT EXAM: MRI ABDOMEN WITHOUT AND WITH CONTRAST (INCLUDING MRCP) TECHNIQUE: Multiplanar multisequence MR imaging of the abdomen was performed both before and after the administration of intravenous contrast. Heavily T2-weighted images of the biliary and pancreatic ducts were obtained, and three-dimensional MRCP images were rendered by  post processing. CONTRAST:  5.42mL GADAVIST GADOBUTROL 1 MMOL/ML IV SOLN COMPARISON:  CT abdomen pelvis, 06/13/2022 CT abdomen pelvis, 07/12/2017 FINDINGS: Lower chest: Trace bilateral pleural effusions. Hepatobiliary: No solid liver abnormality is seen. No gallstones, gallbladder wall thickening, or biliary dilatation. Pancreas: Diffuse inflammatory fat stranding about the pancreatic head and neck. No evidence of fluid collection or parenchymal hypoenhancement. Pancreatic divisum. Mildly prominent pancreatic duct measuring up to 0.5 cm in caliber along its length. No obstructing lesion identified to the ampulla. In the dorsal pancreatic head and uncinate, there is a multi septated cystic lesion measuring 1.8 x 0.9 x 1.8 cm (series 4, image 29, series 3, image 14). On retrospective review of prior examination of the abdomen pelvis dated 07/12/2017, this lesion is not significantly changed. Multiphasic contrast enhanced sequences are unfortunately limited by breath motion artifact, however there is no obvious solid component or contrast enhancement. Spleen: Normal in size without significant abnormality. Adrenals/Urinary Tract: Adrenal glands are unremarkable. Kidneys are normal, without renal calculi, solid lesion, or hydronephrosis. Bladder is unremarkable. Stomach/Bowel: Stomach is within normal limits. Appendix appears normal. No evidence of bowel wall thickening, distention, or inflammatory  changes. Vascular/Lymphatic: No significant vascular findings are present. No enlarged abdominal or pelvic lymph nodes. Reproductive: No mass or other significant abnormality. Other: No abdominal wall hernia or abnormality. No ascites. Musculoskeletal: No acute or significant osseous findings. IMPRESSION: 1. Diffuse inflammatory fat stranding about the pancreatic head and neck, consistent with acute pancreatitis. No evidence of complicating fluid collection or parenchymal necrosis 2. Pancreatic divisum. Mildly prominent  pancreatic duct measuring up to 0.5 cm in caliber along its length. No obstructing lesion identified to the ampulla. 3. In the dorsal pancreatic head and uncinate, there is a multiseptated cystic lesion measuring 1.8 x 0.9 x 1.8 cm. On retrospective review of prior examination of the abdomen pelvis dated 07/12/2017, this lesion is not significantly changed. Multiphasic contrast enhanced sequences are unfortunately limited by breath motion artifact, however there is no obvious solid component or contrast enhancement. 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 Given patient age and greater than 5 years stability, this lesion is benign and no further follow-up or characterization is required. This recommendation follows ACR white paper guidelines on management of incidental pancreatic cystic lesions. 4. Trace bilateral pleural effusions. Electronically Signed   By: Jearld Lesch M.D.   On: 06/13/2022 18:58   MR 3D Recon At Scanner  Result Date: 06/13/2022 CLINICAL DATA:  Pancreatitis suspected, abdominal pain, abnormal CT EXAM: MRI ABDOMEN WITHOUT AND WITH CONTRAST (INCLUDING MRCP) TECHNIQUE: Multiplanar multisequence MR imaging of the abdomen was performed both before and after the administration of intravenous contrast. Heavily T2-weighted images of the biliary and pancreatic ducts were obtained, and three-dimensional MRCP images were rendered by post processing. CONTRAST:  5.62mL GADAVIST GADOBUTROL 1 MMOL/ML IV SOLN COMPARISON:  CT abdomen pelvis, 06/13/2022 CT abdomen pelvis, 07/12/2017 FINDINGS: Lower chest: Trace bilateral pleural effusions. Hepatobiliary: No solid liver abnormality is seen. No gallstones, gallbladder wall thickening, or biliary dilatation. Pancreas: Diffuse inflammatory fat stranding about the pancreatic head and neck. No evidence of fluid collection or parenchymal hypoenhancement. Pancreatic divisum. Mildly prominent pancreatic duct measuring up to 0.5 cm in  caliber along its length. No obstructing lesion identified to the ampulla. In the dorsal pancreatic head and uncinate, there is a multi septated cystic lesion measuring 1.8 x 0.9 x 1.8 cm (series 4, image 29, series 3, image 14). On retrospective review of prior examination of the abdomen pelvis dated 07/12/2017, this lesion is not significantly changed. Multiphasic contrast enhanced sequences are unfortunately limited by breath motion artifact, however there is no obvious solid component or contrast enhancement. Spleen: Normal in size without significant abnormality. Adrenals/Urinary Tract: Adrenal glands are unremarkable. Kidneys are normal, without renal calculi, solid lesion, or hydronephrosis. Bladder is unremarkable. Stomach/Bowel: Stomach is within normal limits. Appendix appears normal. No evidence of bowel wall thickening, distention, or inflammatory changes. Vascular/Lymphatic: No significant vascular findings are present. No enlarged abdominal or pelvic lymph nodes. Reproductive: No mass or other significant abnormality. Other: No abdominal wall hernia or abnormality. No ascites. Musculoskeletal: No acute or significant osseous findings. IMPRESSION: 1. Diffuse inflammatory fat stranding about the pancreatic head and neck, consistent with acute pancreatitis. No evidence of complicating fluid collection or parenchymal necrosis 2. Pancreatic divisum. Mildly prominent pancreatic duct measuring up to 0.5 cm in caliber along its length. No obstructing lesion identified to the ampulla. 3. In the dorsal pancreatic head and uncinate, there is a multiseptated cystic lesion measuring 1.8 x 0.9 x 1.8 cm. On retrospective review of prior examination of the abdomen pelvis dated 07/12/2017, this lesion is  not significantly changed. Multiphasic contrast enhanced sequences are unfortunately limited by breath motion artifact, however there is no obvious solid component or contrast enhancement. This is most likely a side  branch intraductal papillary mucinous neoplasm. Given patient age and greater than 5 years stability, this lesion is benign and no further follow-up or characterization is required. This recommendation follows ACR white paper guidelines on management of incidental pancreatic cystic lesions. 4. Trace bilateral pleural effusions. Electronically Signed   By: Jearld Lesch M.D.   On: 06/13/2022 18:58   CT ABDOMEN PELVIS W CONTRAST  Result Date: 06/13/2022 CLINICAL DATA:  Abdominal pain, constipation, and bloating. EXAM: CT ABDOMEN AND PELVIS WITH CONTRAST TECHNIQUE: Multidetector CT imaging of the abdomen and pelvis was performed using the standard protocol following bolus administration of intravenous contrast. RADIATION DOSE REDUCTION: This exam was performed according to the departmental dose-optimization program which includes automated exposure control, adjustment of the mA and/or kV according to patient size and/or use of iterative reconstruction technique. CONTRAST:  OMNIPAQUE IOHEXOL 300 MG/ML  SOLN COMPARISON:  04/23/2022. FINDINGS: Lower chest: Mild atelectasis at the lung bases. Trace right pleural effusion. Hepatobiliary: No focal liver abnormality is seen. No gallstones, gallbladder wall thickening, or biliary dilatation. Pancreas: Peripancreatic fat stranding and surrounding inflammatory changes noted at the pancreatic head and body. There is heterogeneous enhancement at the pancreatic head with a 1.3 cm hypodense region, best seen on coronal image 52. There is mild dilatation of the pancreatic duct measuring 5 mm. Spleen: Normal in size without focal abnormality. Adrenals/Urinary Tract: The adrenal glands are within normal limits. The kidneys enhance symmetrically. A punctate nonobstructive calcification is noted on the right. Extrarenal pelvises are present bilaterally. The visualized portion of the urinary bladder is unremarkable. Stomach/Bowel: There is thickening of the walls of the gastric  antrum. There is mild thickening of the walls of the second and third portions of the duodenum with surrounding inflammatory changes. No bowel obstruction, free air, or pneumatosis. A few scattered diverticula are noted along the colon without evidence of diverticulitis. A normal appendix is present in the right lower quadrant. A moderate amount of retained stool is present in the ascending and transverse colon. Vascular/Lymphatic: Aortic atherosclerosis. No enlarged abdominal or pelvic lymph nodes. The portal vein, splenic vein, and superior mesenteric vein are patent. Reproductive: Status post hysterectomy. No adnexal masses. Other: Fat containing umbilical hernia. Musculoskeletal: Total hip arthroplasty changes are noted on the right. There are degenerative changes at the left hip and thoracolumbar spine. No acute osseous abnormality. IMPRESSION: 1. Findings suggestive of acute pancreatitis. There is mild dilatation of the pancreatic duct and a hypodense region at the pancreatic head, possible cysts, necrosis, or underlying lesion. MRI is recommended for further evaluation following resolution of acute inflammatory changes. 2. Thickening of the walls of the gastric antrum and proximal duodenum, suggesting gastritis and duodenitis. 3. Trace right pleural effusion with atelectasis at the lung bases bilaterally. 4. Aortic atherosclerosis. Electronically Signed   By: Thornell Sartorius M.D.   On: 06/13/2022 03:56        Scheduled Meds:  amLODipine  2.5 mg Oral Daily   aspirin EC  81 mg Oral Daily   hydrALAZINE  25 mg Oral BID   isosorbide mononitrate  90 mg Oral Daily   [START ON 06/15/2022] levothyroxine  50 mcg Oral QAC breakfast   pantoprazole  40 mg Oral Daily   Continuous Infusions:  sodium chloride 100 mL/hr at 06/14/22 1131     LOS: 0  days    Time spent: 35 minutes    Dorcas CarrowKuber Kelsa Jaworowski, MD [facility] Pager [phone]

## 2022-06-14 NOTE — Plan of Care (Signed)

## 2022-06-15 LAB — CBC WITH DIFFERENTIAL/PLATELET
Abs Immature Granulocytes: 0.02 10*3/uL (ref 0.00–0.07)
Basophils Absolute: 0 10*3/uL (ref 0.0–0.1)
Basophils Relative: 1 %
Eosinophils Absolute: 0.2 10*3/uL (ref 0.0–0.5)
Eosinophils Relative: 3 %
HCT: 34.8 % — ABNORMAL LOW (ref 36.0–46.0)
Hemoglobin: 11.1 g/dL — ABNORMAL LOW (ref 12.0–15.0)
Immature Granulocytes: 0 %
Lymphocytes Relative: 12 %
Lymphs Abs: 0.6 10*3/uL — ABNORMAL LOW (ref 0.7–4.0)
MCH: 30.9 pg (ref 26.0–34.0)
MCHC: 31.9 g/dL (ref 30.0–36.0)
MCV: 96.9 fL (ref 80.0–100.0)
Monocytes Absolute: 0.6 10*3/uL (ref 0.1–1.0)
Monocytes Relative: 12 %
Neutro Abs: 3.6 10*3/uL (ref 1.7–7.7)
Neutrophils Relative %: 72 %
Platelets: 273 10*3/uL (ref 150–400)
RBC: 3.59 MIL/uL — ABNORMAL LOW (ref 3.87–5.11)
RDW: 11.9 % (ref 11.5–15.5)
WBC: 5.1 10*3/uL (ref 4.0–10.5)
nRBC: 0 % (ref 0.0–0.2)

## 2022-06-15 LAB — COMPREHENSIVE METABOLIC PANEL
ALT: 22 U/L (ref 0–44)
AST: 20 U/L (ref 15–41)
Albumin: 2.7 g/dL — ABNORMAL LOW (ref 3.5–5.0)
Alkaline Phosphatase: 83 U/L (ref 38–126)
Anion gap: 9 (ref 5–15)
BUN: 9 mg/dL (ref 8–23)
CO2: 23 mmol/L (ref 22–32)
Calcium: 8.2 mg/dL — ABNORMAL LOW (ref 8.9–10.3)
Chloride: 105 mmol/L (ref 98–111)
Creatinine, Ser: 0.61 mg/dL (ref 0.44–1.00)
GFR, Estimated: >60 mL/min (ref >60)
Glucose, Bld: 87 mg/dL (ref 70–99)
Potassium: 4 mmol/L (ref 3.5–5.1)
Sodium: 137 mmol/L (ref 135–145)
Total Bilirubin: 0.9 mg/dL (ref 0.3–1.2)
Total Protein: 5.5 g/dL — ABNORMAL LOW (ref 6.5–8.1)

## 2022-06-15 LAB — PHOSPHORUS: Phosphorus: 2.7 mg/dL (ref 2.5–4.6)

## 2022-06-15 LAB — MAGNESIUM: Magnesium: 2 mg/dL (ref 1.7–2.4)

## 2022-06-15 LAB — LIPASE, BLOOD: Lipase: 26 U/L (ref 11–51)

## 2022-06-15 MED ORDER — POLYETHYLENE GLYCOL 3350 17 G PO PACK
17.0000 g | PACK | Freq: Every day | ORAL | Status: DC
  Filled 2022-06-15: qty 1

## 2022-06-15 MED ORDER — SENNOSIDES-DOCUSATE SODIUM 8.6-50 MG PO TABS
1.0000 | ORAL_TABLET | Freq: Two times a day (BID) | ORAL | Status: DC
  Administered 2022-06-15: 1 via ORAL
  Filled 2022-06-15: qty 1

## 2022-06-15 NOTE — Progress Notes (Signed)
  Transition of Care [facility]) Screening Note   Patient Details  Name: Kaitlyn Parks Date of Birth: [birth year]   Transition of Care [facility]) CM/SW Contact:    Otelia Santee, LCSW Phone Number: 06/15/2022, 1:35 PM    Transition of Care Department [facility]) has reviewed patient and no TOC needs have been identified at this time. We will continue to monitor patient advancement through interdisciplinary progression rounds. If new patient transition needs arise, please place a TOC consult.

## 2022-06-15 NOTE — Discharge Summary (Signed)
Physician Discharge Summary  Kaitlyn Parks JGO:[id number] DOB: 01-03-1935 DOA: 06/13/2022  PCP: Karie Georges, MD  Admit date: 06/13/2022 Discharge date: 06/15/2022  Admitted From: Home Disposition: Home  Recommendations for Outpatient Follow-up:  Follow up with PCP in 1-2 weeks We will send to gastroenterology for follow-up  Home Health: N/A Equipment/Devices: N/A  Discharge Condition: Stable CODE STATUS: Full code Diet recommendation: Low-salt diet  Discharge summary: 88 year old with history of hypertension, coronary artery disease and hyperlipidemia, GERD and hyponatremia presented with multiple days of nausea vomiting and epigastric abdominal pain that she attributed to eating a crab cake.  Seen at primary care physician's office, started on PPI and MiraLAX without relief.  In the emergency room hemodynamically stable.  She was found to have acute pancreatitis on Ct scan without complications.  Admitted due to significant symptoms.  #  Acute appendicitis and duodenitis: Primary cause unknown.  First episode however she has had GI symptoms in the past. MRI as well as recent right upper quadrant ultrasound with no evidence of gallbladder disease.  no alcohol. MRCP consistent with diffuse pancreatitis of the pancreatic head, without complications.  Pancreatic divisum.  Also found to have multiseptated cystic lesion at the pancreatic head that is 1.8 x 1.8 cm in size.  Radiologist was able to address it to similar size septated cyst on CT scan in 2018.  Likely benign process as it is not grown in last 5 years.  Structural abnormality may be contributing to pancreatitis. Lipase was 36 and remained normal.  Pain is controlled.  Tolerating regular diet.  Normal bowel movements.  Electrolytes are normal.  Lipase is normal. Discharged home. Will ask follow-up with gastroenterology as she was seen before for abdominal pain.  MRI results were discussed with patient and her daughter,  likely no intervention needed, however may need to monitor.   Chronic medical history significant For essential hypertension, blood pressure elevated.  Resume home medications Hyperlipidemia, continue to hold statin. GERD, PPI.  Change from IV to oral. Hypothyroidism: Resume home dose of Synthroid.  Stable for discharge.    Discharge Diagnoses:  Principal Problem:   Abdominal pain Active Problems:   Hypothyroidism (acquired)   Hypertension   CAD (coronary artery disease)   GERD (gastroesophageal reflux disease)   Dyslipidemia   Acute pancreatitis   Hyponatremia   Pancreatitis, acute    Discharge Instructions  Discharge Instructions     Ambulatory referral to Gastroenterology   Complete by: As directed    Follow up abnormal MRI of pancrease   What is the reason for referral?: Other   Call MD for:  persistant nausea and vomiting   Complete by: As directed    Call MD for:  severe uncontrolled pain   Complete by: As directed    Diet - low sodium heart healthy   Complete by: As directed    Increase activity slowly   Complete by: As directed       Allergies as of 06/15/2022       Reactions   Acyclovir And Related Other (See Comments)   unknown   Pravachol [pravastatin Sodium] Other (See Comments)   cystitis   Sulfa Antibiotics    nausea   Zocor [simvastatin] Other (See Comments)   cystitis        Medication List     TAKE these medications    alum & mag hydroxide-simeth 400-400-40 MG/5ML suspension Commonly known as: MAALOX PLUS Take 15 mLs by mouth every 6 (six) hours as  needed for indigestion.   amLODipine 2.5 MG tablet Commonly known as: NORVASC Take 1 tablet (2.5 mg total) by mouth daily.   aspirin EC 81 MG tablet Take 81 mg by mouth daily.   Cranberry 400 MG Caps Take 400 mg by mouth daily.   HAIR/SKIN/NAILS PO Take 1 tablet by mouth daily.   hydrALAZINE 10 MG tablet Commonly known as: APRESOLINE TAKE (1) TABLET TWICE A DAY. MAY TAKE  EXTRA DOSE FOR SBP >160 UP TO A TOTAL OF 4 TIMES A DAY. What changed: additional instructions   ibuprofen 400 MG tablet Commonly known as: ADVIL Take 400 mg by mouth every 6 (six) hours as needed for mild pain.   isosorbide mononitrate 60 MG 24 hr tablet Commonly known as: IMDUR Take 90 mg by mouth daily. One and one half tablet daily   nitrofurantoin (macrocrystal-monohydrate) 100 MG capsule Commonly known as: MACROBID Take 100 mg by mouth daily as needed (UTI). Takes as Needed   nitroGLYCERIN 0.4 MG SL tablet Commonly known as: NITROSTAT DISSOLVE 1 TABLET UNDER TONGUE IF NEEDED FOR CHEST PAIN. MAY REPEAT IN 5 MINUTES FOR 3 DOSES.   ondansetron 4 MG disintegrating tablet Commonly known as: ZOFRAN-ODT Take 1 tablet (4 mg total) by mouth every 8 (eight) hours as needed for nausea or vomiting.   ONE-A-DAY WOMENS PO Take 1 tablet by mouth daily.   pantoprazole 40 MG tablet Commonly known as: PROTONIX Take 1 tablet (40 mg total) by mouth daily.   PRESCRIPTION MEDICATION Avastin eye injections given by Dr Vanessa Barbara due to macular degeneration   rosuvastatin 10 MG tablet Commonly known as: CRESTOR TABLET ONE-HALF TABLET BY MOUTH DAILY What changed: See the new instructions.   Synthroid 50 MCG tablet Generic drug: levothyroxine TAKE 1 TABLET EACH DAY. What changed:  how much to take how to take this when to take this   Systane 0.4-0.3 % Gel ophthalmic gel Generic drug: Polyethyl Glycol-Propyl Glycol Place 1 Application into both eyes daily as needed (dry eyes).        Allergies  Allergen Reactions   Acyclovir And Related Other (See Comments)    unknown   Pravachol [Pravastatin Sodium] Other (See Comments)    cystitis   Sulfa Antibiotics     nausea   Zocor [Simvastatin] Other (See Comments)    cystitis    Consultations: None   Procedures/Studies: MR ABDOMEN MRCP W WO CONTAST  Result Date: 06/13/2022 CLINICAL DATA:  Pancreatitis suspected, abdominal pain,  abnormal CT EXAM: MRI ABDOMEN WITHOUT AND WITH CONTRAST (INCLUDING MRCP) TECHNIQUE: Multiplanar multisequence MR imaging of the abdomen was performed both before and after the administration of intravenous contrast. Heavily T2-weighted images of the biliary and pancreatic ducts were obtained, and three-dimensional MRCP images were rendered by post processing. CONTRAST:  5.22mL GADAVIST GADOBUTROL 1 MMOL/ML IV SOLN COMPARISON:  CT abdomen pelvis, 06/13/2022 CT abdomen pelvis, 07/12/2017 FINDINGS: Lower chest: Trace bilateral pleural effusions. Hepatobiliary: No solid liver abnormality is seen. No gallstones, gallbladder wall thickening, or biliary dilatation. Pancreas: Diffuse inflammatory fat stranding about the pancreatic head and neck. No evidence of fluid collection or parenchymal hypoenhancement. Pancreatic divisum. Mildly prominent pancreatic duct measuring up to 0.5 cm in caliber along its length. No obstructing lesion identified to the ampulla. In the dorsal pancreatic head and uncinate, there is a multi septated cystic lesion measuring 1.8 x 0.9 x 1.8 cm (series 4, image 29, series 3, image 14). On retrospective review of prior examination of the abdomen pelvis dated 07/12/2017, this  lesion is not significantly changed. Multiphasic contrast enhanced sequences are unfortunately limited by breath motion artifact, however there is no obvious solid component or contrast enhancement. Spleen: Normal in size without significant abnormality. Adrenals/Urinary Tract: Adrenal glands are unremarkable. Kidneys are normal, without renal calculi, solid lesion, or hydronephrosis. Bladder is unremarkable. Stomach/Bowel: Stomach is within normal limits. Appendix appears normal. No evidence of bowel wall thickening, distention, or inflammatory changes. Vascular/Lymphatic: No significant vascular findings are present. No enlarged abdominal or pelvic lymph nodes. Reproductive: No mass or other significant abnormality. Other: No  abdominal wall hernia or abnormality. No ascites. Musculoskeletal: No acute or significant osseous findings. IMPRESSION: 1. Diffuse inflammatory fat stranding about the pancreatic head and neck, consistent with acute pancreatitis. No evidence of complicating fluid collection or parenchymal necrosis 2. Pancreatic divisum. Mildly prominent pancreatic duct measuring up to 0.5 cm in caliber along its length. No obstructing lesion identified to the ampulla. 3. In the dorsal pancreatic head and uncinate, there is a multiseptated cystic lesion measuring 1.8 x 0.9 x 1.8 cm. On retrospective review of prior examination of the abdomen pelvis dated 07/12/2017, this lesion is not significantly changed. Multiphasic contrast enhanced sequences are unfortunately limited by breath motion artifact, however there is no obvious solid component or contrast enhancement. 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 Given patient age and greater than 5 years stability, this lesion is benign and no further follow-up or characterization is required. This recommendation follows ACR white paper guidelines on management of incidental pancreatic cystic lesions. 4. Trace bilateral pleural effusions. Electronically Signed   By: Jearld Lesch M.D.   On: 06/13/2022 18:58   MR 3D Recon At Scanner  Result Date: 06/13/2022 CLINICAL DATA:  Pancreatitis suspected, abdominal pain, abnormal CT EXAM: MRI ABDOMEN WITHOUT AND WITH CONTRAST (INCLUDING MRCP) TECHNIQUE: Multiplanar multisequence MR imaging of the abdomen was performed both before and after the administration of intravenous contrast. Heavily T2-weighted images of the biliary and pancreatic ducts were obtained, and three-dimensional MRCP images were rendered by post processing. CONTRAST:  5.71mL GADAVIST GADOBUTROL 1 MMOL/ML IV SOLN COMPARISON:  CT abdomen pelvis, 06/13/2022 CT abdomen pelvis, 07/12/2017 FINDINGS: Lower chest: Trace bilateral pleural effusions.  Hepatobiliary: No solid liver abnormality is seen. No gallstones, gallbladder wall thickening, or biliary dilatation. Pancreas: Diffuse inflammatory fat stranding about the pancreatic head and neck. No evidence of fluid collection or parenchymal hypoenhancement. Pancreatic divisum. Mildly prominent pancreatic duct measuring up to 0.5 cm in caliber along its length. No obstructing lesion identified to the ampulla. In the dorsal pancreatic head and uncinate, there is a multi septated cystic lesion measuring 1.8 x 0.9 x 1.8 cm (series 4, image 29, series 3, image 14). On retrospective review of prior examination of the abdomen pelvis dated 07/12/2017, this lesion is not significantly changed. Multiphasic contrast enhanced sequences are unfortunately limited by breath motion artifact, however there is no obvious solid component or contrast enhancement. Spleen: Normal in size without significant abnormality. Adrenals/Urinary Tract: Adrenal glands are unremarkable. Kidneys are normal, without renal calculi, solid lesion, or hydronephrosis. Bladder is unremarkable. Stomach/Bowel: Stomach is within normal limits. Appendix appears normal. No evidence of bowel wall thickening, distention, or inflammatory changes. Vascular/Lymphatic: No significant vascular findings are present. No enlarged abdominal or pelvic lymph nodes. Reproductive: No mass or other significant abnormality. Other: No abdominal wall hernia or abnormality. No ascites. Musculoskeletal: No acute or significant osseous findings. IMPRESSION: 1. Diffuse inflammatory fat stranding about the pancreatic head and neck, consistent with acute  pancreatitis. No evidence of complicating fluid collection or parenchymal necrosis 2. Pancreatic divisum. Mildly prominent pancreatic duct measuring up to 0.5 cm in caliber along its length. No obstructing lesion identified to the ampulla. 3. In the dorsal pancreatic head and uncinate, there is a multiseptated cystic lesion  measuring 1.8 x 0.9 x 1.8 cm. On retrospective review of prior examination of the abdomen pelvis dated 07/12/2017, this lesion is not significantly changed. Multiphasic contrast enhanced sequences are unfortunately limited by breath motion artifact, however there is no obvious solid component or contrast enhancement. 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 Given patient age and greater than 5 years stability, this lesion is benign and no further follow-up or characterization is required. This recommendation follows ACR white paper guidelines on management of incidental pancreatic cystic lesions. 4. Trace bilateral pleural effusions. Electronically Signed   By: Jearld LeschAlex D Bibbey M.D.   On: 06/13/2022 18:58   CT ABDOMEN PELVIS W CONTRAST  Result Date: 06/13/2022 CLINICAL DATA:  Abdominal pain, constipation, and bloating. EXAM: CT ABDOMEN AND PELVIS WITH CONTRAST TECHNIQUE: Multidetector CT imaging of the abdomen and pelvis was performed using the standard protocol following bolus administration of intravenous contrast. RADIATION DOSE REDUCTION: This exam was performed according to the departmental dose-optimization program which includes automated exposure control, adjustment of the mA and/or kV according to patient size and/or use of iterative reconstruction technique. CONTRAST:  100mL OMNIPAQUE IOHEXOL 300 MG/ML  SOLN COMPARISON:  04/23/2022. FINDINGS: Lower chest: Mild atelectasis at the lung bases. Trace right pleural effusion. Hepatobiliary: No focal liver abnormality is seen. No gallstones, gallbladder wall thickening, or biliary dilatation. Pancreas: Peripancreatic fat stranding and surrounding inflammatory changes noted at the pancreatic head and body. There is heterogeneous enhancement at the pancreatic head with a 1.3 cm hypodense region, best seen on coronal image 52. There is mild dilatation of the pancreatic duct measuring 5 mm. Spleen: Normal in size without focal  abnormality. Adrenals/Urinary Tract: The adrenal glands are within normal limits. The kidneys enhance symmetrically. A punctate nonobstructive calcification is noted on the right. Extrarenal pelvises are present bilaterally. The visualized portion of the urinary bladder is unremarkable. Stomach/Bowel: There is thickening of the walls of the gastric antrum. There is mild thickening of the walls of the second and third portions of the duodenum with surrounding inflammatory changes. No bowel obstruction, free air, or pneumatosis. A few scattered diverticula are noted along the colon without evidence of diverticulitis. A normal appendix is present in the right lower quadrant. A moderate amount of retained stool is present in the ascending and transverse colon. Vascular/Lymphatic: Aortic atherosclerosis. No enlarged abdominal or pelvic lymph nodes. The portal vein, splenic vein, and superior mesenteric vein are patent. Reproductive: Status post hysterectomy. No adnexal masses. Other: Fat containing umbilical hernia. Musculoskeletal: Total hip arthroplasty changes are noted on the right. There are degenerative changes at the left hip and thoracolumbar spine. No acute osseous abnormality. IMPRESSION: 1. Findings suggestive of acute pancreatitis. There is mild dilatation of the pancreatic duct and a hypodense region at the pancreatic head, possible cysts, necrosis, or underlying lesion. MRI is recommended for further evaluation following resolution of acute inflammatory changes. 2. Thickening of the walls of the gastric antrum and proximal duodenum, suggesting gastritis and duodenitis. 3. Trace right pleural effusion with atelectasis at the lung bases bilaterally. 4. Aortic atherosclerosis. Electronically Signed   By: Thornell SartoriusLaura  Taylor M.D.   On: 06/13/2022 03:56   (Echo, Carotid, EGD, Colonoscopy, ERCP)  Subjective: Patient seen in the morning rounds.  She had no complaints.  She wanted to eat regular diet.  She ate  regular food for lunch and had no recurrence of pain.  Eager to go home.   Discharge Exam: Vitals:   06/15/22 0624 06/15/22 1356  BP: (!) 158/75 129/69  Pulse: 74 85  Resp: 18   Temp: 98.2 F (36.8 C) 98.7 F (37.1 C)  SpO2: 98% 98%   Vitals:   06/14/22 1219 06/14/22 2008 06/15/22 0624 06/15/22 1356  BP: (!) 167/68 (!) 143/61 (!) 158/75 129/69  Pulse: 65 79 74 85  Resp: 18 16 18    Temp: 97.7 F (36.5 C) 99.2 F (37.3 C) 98.2 F (36.8 C) 98.7 F (37.1 C)  TempSrc: Oral Oral Oral Oral  SpO2: 98% 96% 98% 98%  Weight:      Height:        General: Pt is alert, awake, not in acute distress Walking around in the room.  On her dress from home. Cardiovascular: RRR, S1/S2 +, no rubs, no gallops Respiratory: CTA bilaterally, no wheezing, no rhonchi Abdominal: Soft, NT, ND, bowel sounds + Extremities: no edema, no cyanosis    The results of significant diagnostics from this hospitalization (including imaging, microbiology, ancillary and laboratory) are listed below for reference.     Microbiology: No results found for this or any previous visit (from the past 240 hour(s)).   Labs: BNP (last 3 results) No results for input(s): "BNP" in the last 8760 hours. Basic Metabolic Panel: Recent Labs  Lab 06/12/22 2101 06/13/22 1847 06/14/22 0606 06/15/22 0524  NA 130* 139 137 137  K 4.1 3.8 3.4* 4.0  CL 95* 105 102 105  CO2 25 26 25 23   GLUCOSE 108* 93 76 87  BUN 22 13 12 9   CREATININE 0.91 0.70 0.70 0.61  CALCIUM 8.9 8.4* 8.5* 8.2*  MG  --   --   --  2.0  PHOS  --   --   --  2.7   Liver Function Tests: Recent Labs  Lab 06/12/22 2101 06/13/22 1847 06/14/22 0606 06/15/22 0524  AST 49* 27 30 20   ALT 44 32 32 22  ALKPHOS 83 93 104 83  BILITOT 0.5 0.6 0.9 0.9  PROT 6.4* 6.2* 6.4* 5.5*  ALBUMIN 3.6 3.1* 3.2* 2.7*   Recent Labs  Lab 06/12/22 2101 06/15/22 0524  LIPASE 30 26   No results for input(s): "AMMONIA" in the last 168 hours. CBC: Recent Labs  Lab  06/12/22 2101 06/13/22 1847 06/14/22 0606 06/15/22 0524  WBC 10.7* 6.7 6.3 5.1  NEUTROABS  --  5.3  --  3.6  HGB 12.5 11.9* 13.0 11.1*  HCT 38.2 36.7 41.9 34.8*  MCV 94.3 95.1 98.6 96.9  PLT 218 214 274 273   Cardiac Enzymes: No results for input(s): "CKTOTAL", "CKMB", "CKMBINDEX", "TROPONINI" in the last 168 hours. BNP: Invalid input(s): "POCBNP" CBG: No results for input(s): "GLUCAP" in the last 168 hours. D-Dimer No results for input(s): "DDIMER" in the last 72 hours. Hgb A1c No results for input(s): "HGBA1C" in the last 72 hours. Lipid Profile No results for input(s): "CHOL", "HDL", "LDLCALC", "TRIG", "CHOLHDL", "LDLDIRECT" in the last 72 hours. Thyroid function studies No results for input(s): "TSH", "T4TOTAL", "T3FREE", "THYROIDAB" in the last 72 hours.  Invalid input(s): "FREET3" Anemia work up No results for input(s): "VITAMINB12", "FOLATE", "FERRITIN", "TIBC", "IRON", "RETICCTPCT" in the last 72 hours. Urinalysis    Component Value Date/Time   COLORURINE  YELLOW 06/12/2022 2101   APPEARANCEUR CLEAR 06/12/2022 2101   LABSPEC 1.013 06/12/2022 2101   PHURINE 6.0 06/12/2022 2101   GLUCOSEU NEGATIVE 06/12/2022 2101   GLUCOSEU NEGATIVE 09/11/2020 1041   HGBUR NEGATIVE 06/12/2022 2101   BILIRUBINUR NEGATIVE 06/12/2022 2101   BILIRUBINUR negative 11/06/2020 1104   KETONESUR NEGATIVE 06/12/2022 2101   PROTEINUR TRACE (A) 06/12/2022 2101   UROBILINOGEN 0.2 11/06/2020 1104   UROBILINOGEN 0.2 09/11/2020 1041   NITRITE NEGATIVE 06/12/2022 2101   LEUKOCYTESUR MODERATE (A) 06/12/2022 2101   Sepsis Labs Recent Labs  Lab 06/12/22 2101 06/13/22 1847 06/14/22 0606 06/15/22 0524  WBC 10.7* 6.7 6.3 5.1   Microbiology No results found for this or any previous visit (from the past 240 hour(s)).   Time coordinating discharge: 32 minutes  SIGNED:   Dorcas Carrow, MD  [facility] 06/15/2022, 2:49 PM

## 2022-06-15 NOTE — Progress Notes (Signed)
Patient discharged via wheelchair daughter came to bring her home. Pt. Is alert and oriented, no distress. Discharge instruction and education discussed with patient. All personal belongings are with patient.

## 2022-06-15 NOTE — Progress Notes (Signed)
Mobility Specialist - Progress Note   06/15/22 1232  Mobility  Activity Ambulated independently in hallway  Level of Assistance Independent after set-up  Assistive Device None  Distance Ambulated (ft) 750 ft  Activity Response Tolerated well  Mobility Referral Yes  $Mobility charge 1 Mobility   Pt received in chair and agreed to mobility, no c/o pain no discomfort during session. Pt returned to chair with all needs met.   Roderick Pee Mobility Specialist

## 2022-06-16 ENCOUNTER — Telehealth: Payer: Self-pay

## 2022-06-16 ENCOUNTER — Telehealth: Payer: Self-pay | Admitting: Physician Assistant

## 2022-06-16 NOTE — Telephone Encounter (Signed)
Inbound call from patient stating she was just released from the hospital and would like to f/u with a nurse. Patient scheduled jan of 2024. Please advise.

## 2022-06-16 NOTE — Telephone Encounter (Addendum)
Transition Care Management Unsuccessful Follow-up Telephone Call  Date of discharge and from where:  TCM DC [facility] 06-15-22 Dx: abdominal pain   Attempts:  1st Attempt  Reason for unsuccessful TCM follow-up call:  Left voice message   Woodfin Ganja LPN [facility] Nurse Health Advisor Direct Dial [phone]  Pt is going to fu with Dr Russella Dar 06-30-22 at 320pm- she does plan to fu with [facility] as well - will call if appt with PCP is needed- TCM not done    Woodfin Ganja LPN [facility] Nurse Health Advisor Direct Dial [phone]

## 2022-06-16 NOTE — Telephone Encounter (Signed)
Returned call to patient. We rescheduled her for a sooner appt on 06/30/22 at 3:20 pm with Dr. Russella Dar. Pt had no concerns at the end of the call.

## 2022-06-18 ENCOUNTER — Encounter: Payer: Self-pay | Admitting: Family Medicine

## 2022-06-18 ENCOUNTER — Ambulatory Visit: Payer: Medicare Other | Admitting: Family Medicine

## 2022-06-18 VITALS — BP 112/60 | HR 88 | Temp 97.7°F | Ht 63.0 in | Wt 120.3 lb

## 2022-06-18 DIAGNOSIS — N3001 Acute cystitis with hematuria: Secondary | ICD-10-CM | POA: Diagnosis not present

## 2022-06-18 DIAGNOSIS — K8500 Idiopathic acute pancreatitis without necrosis or infection: Secondary | ICD-10-CM | POA: Diagnosis not present

## 2022-06-18 DIAGNOSIS — K2900 Acute gastritis without bleeding: Secondary | ICD-10-CM

## 2022-06-18 DIAGNOSIS — N390 Urinary tract infection, site not specified: Secondary | ICD-10-CM | POA: Insufficient documentation

## 2022-06-18 DIAGNOSIS — R300 Dysuria: Secondary | ICD-10-CM

## 2022-06-18 LAB — POC URINALSYSI DIPSTICK (AUTOMATED)
Bilirubin, UA: NEGATIVE
Glucose, UA: NEGATIVE
Ketones, UA: NEGATIVE
Nitrite, UA: POSITIVE
Protein, UA: NEGATIVE
Spec Grav, UA: 1.01 (ref 1.010–1.025)
Urobilinogen, UA: 0.2 E.U./dL
pH, UA: 6.5 (ref 5.0–8.0)

## 2022-06-18 MED ORDER — ONDANSETRON 4 MG PO TBDP: 4.0000 mg | ORAL_TABLET | Freq: Three times a day (TID) | ORAL | 0 refills | Status: DC | PRN

## 2022-06-18 MED ORDER — CIPROFLOXACIN HCL 250 MG PO TABS: 250.0000 mg | ORAL_TABLET | Freq: Two times a day (BID) | ORAL | 0 refills | Status: AC

## 2022-06-18 NOTE — Progress Notes (Signed)
Established Patient Office Visit  Subjective   Patient ID: Kaitlyn Parks, female    DOB: [birth year]  Age: 87 y.o. MRN: [id number]  Chief Complaint  Patient presents with   Hospitalization Follow-up   Dysuria    X2 days, tried AZO with no relief    Patient is here for hospital discharge follow up. She was seen previous to her ER visit for the abdominal pain, however the pain did not resolve with PPI and miralax so she went to the ER and was diagnosed with acute pancreatitis on CT abd. She was admitted 11/11- 11/13 to the hospital. She was given IV fluids and IV pain medication.  I reviewed the discharge summary. She underwent MRI of the abdomen which showed:   IMPRESSION: 1. Diffuse inflammatory fat stranding about the pancreatic head and neck, consistent with acute pancreatitis. No evidence of complicating fluid collection or parenchymal necrosis 2. Pancreatic divisum. Mildly prominent pancreatic duct measuring up to 0.5 cm in caliber along its length. No obstructing lesion identified to the ampulla. 3. In the dorsal pancreatic head and uncinate, there is a multiseptated cystic lesion measuring 1.8 x 0.9 x 1.8 cm. On retrospective review of prior examination of the abdomen pelvis dated 07/12/2017, this lesion is not significantly changed.   Patient was referred to GI for further recommendations and work up. She is currently reporting constipation and dysuria. States that the miralax did not work for her, states she drank prune juice and chocolate which helped her have a BM. States that her appetite is not better, states she is feeling perpetual nausea and indigestion.  Dysuria  This is a new problem. The current episode started yesterday. The problem has been unchanged. The quality of the pain is described as burning. The pain is mild. There has been no fever. There is No history of pyelonephritis. Associated symptoms include frequency, nausea and urgency. Pertinent negatives include no  discharge or flank pain. She has tried home medications for the symptoms. The treatment provided mild relief. There is no history of catheterization.    Patient Active Problem List   Diagnosis Date Noted   UTI (urinary tract infection) 06/18/2022   Pancreatitis, acute 06/14/2022   Abdominal pain 06/13/2022   Acute pancreatitis 06/13/2022   Hyponatremia 06/13/2022   Gastritis 06/11/2022   Aortic atherosclerosis (HCC) 12/07/2019   Macular degeneration 01/11/2019   Coronary artery disease involving native coronary artery of native heart without angina pectoris 10/29/2018   Presence of intraocular lens 09/23/2018   Bradycardia 11/11/2017   Dyslipidemia 11/11/2017   Pain in joint of right hip 08/04/2017   CAD (coronary artery disease) 06/12/2017   GERD (gastroesophageal reflux disease) 06/12/2017   S/P hip replacement, right 06/12/2017   Osteoarthritis of right hip 06/10/2017   CAD S/P percutaneous coronary angioplasty 02/11/2015   Renal insufficiency 02/11/2015   Prolonged Q-T interval on ECG 04/18/2010   Hypertension 08/11/2007   Hypothyroidism (acquired) 06/08/2007   MITRAL VALVE PROLAPSE 06/08/2007      Review of Systems  Gastrointestinal:  Positive for nausea.  Genitourinary:  Positive for dysuria, frequency and urgency. Negative for flank pain.      Objective:     BP 112/60 (BP Location: Left Arm, Patient Position: Sitting, Cuff Size: Normal)   Pulse 88   Temp 97.7 F (36.5 C) (Oral)   Ht 5\' 3"  (1.6 m)   Wt 120 lb 4.8 oz (54.6 kg)   SpO2 98%   BMI 21.31 kg/m    Physical  Exam   Results for orders placed or performed in visit on 06/18/22  POCT Urinalysis Dipstick (Automated)  Result Value Ref Range   Color, UA yellow    Clarity, UA cloudy    Glucose, UA Negative Negative   Bilirubin, UA negative    Ketones, UA negative    Spec Grav, UA 1.010 1.010 - 1.025   Blood, UA small    pH, UA 6.5 5.0 - 8.0   Protein, UA Negative Negative   Urobilinogen, UA 0.2  0.2 or 1.0 E.U./dL   Nitrite, UA positive    Leukocytes, UA Large (3+) (A) Negative    Last metabolic panel Lab Results  Component Value Date   GLUCOSE 87 06/15/2022   NA 137 06/15/2022   K 4.0 06/15/2022   CL 105 06/15/2022   CO2 23 06/15/2022   BUN 9 06/15/2022   CREATININE 0.61 06/15/2022   GFRNONAA >60 06/15/2022   CALCIUM 8.2 (L) 06/15/2022   PHOS 2.7 06/15/2022   PROT 5.5 (L) 06/15/2022   ALBUMIN 2.7 (L) 06/15/2022   BILITOT 0.9 06/15/2022   ALKPHOS 83 06/15/2022   AST 20 06/15/2022   ALT 22 06/15/2022   ANIONGAP 9 06/15/2022      The ASCVD Risk score (Arnett DK, et al., 2019) failed to calculate for the following reasons:   The 2019 ASCVD risk score is only valid for ages 65 to 57    Assessment & Plan:   Problem List Items Addressed This Visit       Unprioritized   Gastritis   Acute pancreatitis    Acute pancreatitis, overall improved with the hospitalization. She has follow up already scheduled with the GI doctor. She needs a refill on the ondansetron 4 mg every 8 hours PRN. I reviewed her discharge summary and her MRI results with the patient.      Relevant Medications   ondansetron (ZOFRAN-ODT) 4 MG disintegrating tablet   UTI (urinary tract infection)    Positive on UA for nitrites, LE and small blood. Will treat with cipro 250 mg BID for 5 days. Pt will call back if sx do not improve or worsen.      Relevant Medications   ciprofloxacin (CIPRO) 250 MG tablet   Other Visit Diagnoses     Dysuria    -  Primary   Relevant Orders   POCT Urinalysis Dipstick (Automated) (Completed)     I spent 35 minutes with the patient today reviewing hospital documentation, discussing acute pancreatitis and MRI results, we discussed dietary changes and handouts were given also.   Return in about 4 months (around 10/17/2022).    Karie Georges, MD

## 2022-06-18 NOTE — Assessment & Plan Note (Signed)
Acute pancreatitis, overall improved with the hospitalization. She has follow up already scheduled with the GI doctor. She needs a refill on the ondansetron 4 mg every 8 hours PRN. I reviewed her discharge summary and her MRI results with the patient.

## 2022-06-18 NOTE — Progress Notes (Signed)
[facility] - Clinic Note  06/22/2022    CHIEF COMPLAINT Patient presents for Retina Follow Up  HISTORY OF PRESENT ILLNESS: Kaitlyn Parks is a 87 y.o. female who presents to the clinic today for:   HPI     Retina Follow Up   Patient presents with  Wet AMD.  In left eye.  This started months ago.  Severity is moderate.  Duration of 6 weeks.  Since onset it is stable.  I, the attending physician,  performed the HPI with the patient and updated documentation appropriately.        Comments   Patient feels that there is a slight decline in vision in the left eye. She is using AT's OU PRN. She has acute pancreatitis and is on a strict diet.         Last edited by Rennis Chris, MD on 06/22/2022 11:44 PM.      Referring physician: Karie Georges, MD [address] [place],  [place] [address]  HISTORICAL INFORMATION:  Selected notes from the MEDICAL RECORD NUMBER Referred by Dr. [place] DeMarco for concern of exu ARMD LEE: 11.26.19 (J. 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   No current facility-administered medications for this visit. (Ophthalmic Drugs)   Current Outpatient Medications (Other)  Medication Sig   alum & mag hydroxide-simeth (MAALOX PLUS) 400-400-40 MG/5ML suspension Take 15 mLs by mouth every 6 (six) hours as needed for indigestion.   amLODipine (NORVASC) 2.5 MG tablet Take 1 tablet (2.5 mg total) by mouth daily.   aspirin EC 81 MG tablet Take 81 mg by mouth daily.   Biotin w/ Vitamins C & E (HAIR/SKIN/NAILS PO) Take 1 tablet by mouth daily.   ciprofloxacin (CIPRO) 250 MG tablet Take 1 tablet (250 mg total) by  mouth 2 (two) times daily for 5 days.   Cranberry 400 MG CAPS Take 400 mg by mouth daily.   hydrALAZINE (APRESOLINE) 10 MG tablet TAKE (1) TABLET TWICE A DAY. MAY TAKE EXTRA DOSE FOR SBP >160 UP TO A TOTAL OF 4 TIMES A DAY. (Patient taking differently: TAKE (1) TABLET TWICE A DAY. MAY TAKE EXTRA DOSE FOR SBP >160 UP TO A TOTAL OF 4 TIMES A DAY. As Needed)   ibuprofen (ADVIL) 400 MG tablet Take 400 mg by mouth every 6 (six) hours as needed for mild pain.   isosorbide mononitrate (IMDUR) 60 MG 24 hr tablet Take 90 mg by mouth daily. One and one half tablet daily   Multiple Vitamins-Calcium (ONE-A-DAY WOMENS PO) Take 1 tablet by mouth daily.    nitrofurantoin, macrocrystal-monohydrate, (MACROBID) 100 MG capsule Take 100 mg by mouth daily as needed (UTI). Takes as Needed   nitroGLYCERIN (NITROSTAT) 0.4 MG SL tablet DISSOLVE 1 TABLET UNDER TONGUE IF NEEDED FOR CHEST PAIN. MAY REPEAT IN 5 MINUTES FOR 3 DOSES.   ondansetron (ZOFRAN-ODT) 4 MG disintegrating tablet Take 1 tablet (4 mg total) by mouth every 8 (eight) hours as needed for nausea or vomiting.   pantoprazole (PROTONIX) 40 MG tablet Take 1 tablet (40 mg total) by mouth daily.   PRESCRIPTION MEDICATION Avastin eye injections given by Dr Vanessa Barbara due to macular degeneration   rosuvastatin (CRESTOR) 10 MG  tablet TABLET ONE-HALF TABLET BY MOUTH DAILY (Patient taking differently: Take 5 mg by mouth daily.)   SYNTHROID 50 MCG tablet TAKE 1 TABLET EACH DAY. (Patient taking differently: Take 50 mcg by mouth daily before breakfast. TAKE 1 TABLET EACH DAY.)   No current facility-administered medications for this visit. (Other)   Facility-Administered Medications Ordered in Other Visits (Other)  Medication Route   technetium tetrofosmin (TC-MYOVIEW) injection 32.9 millicurie Intravenous   REVIEW OF SYSTEMS: ROS   Positive for: Gastrointestinal, Genitourinary, Musculoskeletal, Cardiovascular, Eyes Negative for: Constitutional, Neurological, Skin, HENT,  Endocrine, Respiratory, Psychiatric, Allergic/Imm, Heme/Lymph Last edited by Julieanne Cotton, COT on 06/22/2022  1:35 PM.     ALLERGIES Allergies  Allergen Reactions   Acyclovir And Related Other (See Comments)    unknown   Pravachol [Pravastatin Sodium] Other (See Comments)    cystitis   Sulfa Antibiotics     nausea   Zocor [Simvastatin] Other (See Comments)    cystitis   PAST MEDICAL HISTORY Past Medical History:  Diagnosis Date   Arthritis    DDD, scoliosis, sees Dr. Lovell Sheehan for this, uses norco very rarely for pain   Bradycardia 11/11/2017   CAD (coronary artery disease)    LAD stenting of a 90% lesion 2012   Cystocele    Educated about COVID-19 virus infection 12/06/2019   Elevated cholesterol    GERD (gastroesophageal reflux disease)    dx in work up 2016 for atypical CP at OSH   pt. denies   Heart murmur    Hypertensive retinopathy    OU   Hypothyroidism    Interstitial cystitis    sees Dr. [name]   Macular degeneration    OU   NSTEMI (non-ST elevated myocardial infarction) [facility])    Osteoporosis    Rectocele    S/P hip replacement, right 06/12/2017   Scoliosis    Thyroid disease    Hypothyroid   Urinary incontinence    USI   Uterine prolapse    Past Surgical History:  Procedure Laterality Date   BLADDER SUSPENSION  2011   CATARACT EXTRACTION Bilateral 2015   Dr. Elmer Picker   CORONARY ANGIOPLASTY WITH STENT PLACEMENT  04/21/2010   LAD 80%, RCA 30%, nl EF, s/p DES LAD   EYE SURGERY     LEFT HEART CATH AND CORONARY ANGIOGRAPHY N/A 06/14/2017   Procedure: LEFT HEART CATH AND CORONARY ANGIOGRAPHY;  Surgeon: Runell Gess, MD;  Location: [facility] INVASIVE [facility];  Service: Cardiovascular;  Laterality: N/A;   OOPHORECTOMY  2011   BSO   TOTAL HIP ARTHROPLASTY Right 06/10/2017   Procedure: RIGHT TOTAL HIP ARTHROPLASTY ANTERIOR APPROACH;  Surgeon: Samson Frederic, MD;  Location: [facility] ORS;  Service: Orthopedics;  Laterality: Right;  Needs RNFA   VAGINAL HYSTERECTOMY   2011   [facility] BSO; benign   FAMILY HISTORY Family History  Problem Relation Age of Onset   Hypertension Mother    Heart disease Mother    Heart disease Father    Heart disease Sister    Diabetes Sister    Uterine cancer Sister        mets to lungs   Lung cancer Sister    Scoliosis Sister    Heart disease Brother    Colon cancer Paternal Aunt 75   Breast cancer Paternal Aunt        Age 42's   Breast cancer Paternal Aunt    Leukemia Paternal Aunt    Lung cancer Paternal Grandfather  smoker   Arthritis Daughter    Breast cancer Cousin        Maternal 1st cousins-Age 42's   Esophageal cancer Neg Hx    Pancreatic cancer Neg Hx    Stomach cancer Neg Hx    SOCIAL HISTORY Social History   Tobacco Use   Smoking status: Never   Smokeless tobacco: Never  Vaping Use   Vaping Use: Never used  Substance Use Topics   Alcohol use: No    Comment: Rare   Drug use: No       OPHTHALMIC EXAM: Base Eye Exam     Visual Acuity (Snellen - Linear)       Right Left   Dist [place] 20/30 20/70   Dist ph [place] NI 20/60 -2    Correction: Glasses         Tonometry (Tonopen, 1:39 PM)       Right Left   Pressure 12 12         Pupils       Dark Light Shape React APD   Right 3 2 Round Brisk None   Left 3 2 Round Brisk None         Visual Fields       Left Right    Full Full         Extraocular Movement       Right Left    Full, Ortho Full, Ortho         Neuro/Psych     Oriented x3: Yes   Mood/Affect: Normal         Dilation     Both eyes: 1.0% Mydriacyl, 2.5% Phenylephrine @ 1:35 PM           Slit Lamp and Fundus Exam     Slit Lamp Exam       Right Left   Lids/Lashes mild Telangiectasia, marginal lesion nasal UL Telangiectasia, Meibomian gland dysfunction, lower lid edema   Conjunctiva/Sclera White and quiet White and quiet, inferior conj chalasis   Cornea Arcus, 1+ Punctate epithelial erosions, fine endo pigment 1+ punctate epithelial  erosions, Arcus, trace, fine endo pigment   Anterior Chamber Deep and clear Deep and clear   Iris Round and dilated Round and well dilated   Lens PC IOL in good position, trace Posterior capsular opacification (linear, extending just to visual axis) PC IOL in excellent position   Anterior Vitreous Vitreous syneresis, Posterior vitreous detachment Vitreous syneresis, Posterior vitreous detachment, vitreous condensations         Fundus Exam       Right Left   Disc Pink and Sharp, Compact, focal PPP Pink and Sharp, mild temporal PPA/PPP   C/D Ratio 0.3 0.3   Macula Blunted foveal reflex, Drusen, RPE mottling and clumping, early Atrophy, +PEDs -- stably improved, trace cystic changes -- improved, No frank heme Blunted foveal reflex, +drusen, pigment clumping, RPE mottling, clumping and atrophy, no heme, central PED / CNV with overlying cystic changes -- stably improved, +GA   Vessels attenuated, Tortuous attenuated, Tortuous   Periphery Attached, scattered reticular degeneration   Attached, scattered reticular degeneration           IMAGING AND PROCEDURES  Imaging and Procedures for @TODAY @  OCT, Retina - OU - Both Eyes       Right Eye Quality was good. Central Foveal Thickness: 240. Progression has improved. Findings include no IRF, no SRF, abnormal foveal contour, retinal drusen , intraretinal hyper-reflective material, pigment epithelial detachment, outer  retinal atrophy (Interval improvement in cystic changes nasal and temporal fovea, patchy ORA / GA).   Left Eye Quality was good. Central Foveal Thickness: 254. Progression has been stable. Findings include normal foveal contour, no IRF, no SRF, retinal drusen , intraretinal hyper-reflective material, pigment epithelial detachment, outer retinal atrophy (stable improvement in IRF/cystic changes, mild [facility] / [facility] inferior to fovea).   Notes *Images captured and stored on drive  Diagnosis / Impression:  OD:  exudative ARMD -- Interval improvement in cystic changes nasal and temporal fovea, patchy ORA / GA OS: exu-ARMD -- stable improvement in IRF/cystic changes, mild [facility] / PED inferior to fovea  Clinical management:  See below  Abbreviations: NFP - Normal foveal profile. CME - cystoid macular edema. PED - pigment epithelial detachment. IRF - intraretinal fluid. SRF - subretinal fluid. EZ - ellipsoid zone. ERM - epiretinal membrane. ORA - outer retinal atrophy. ORT - outer retinal tubulation. SRHM - subretinal hyper-reflective material      Intravitreal Injection, Pharmacologic Agent - OD - Right Eye       Time Out 06/22/2022. 2:17 PM. Confirmed correct patient, procedure, site, and patient consented.   Anesthesia Topical anesthesia was used. Anesthetic medications included Lidocaine 2%, Proparacaine 0.5%.   Procedure Preparation included 5% betadine to ocular surface, eyelid speculum. A (32g) needle was used.   Injection: 1.25 mg Bevacizumab 1.25mg /0.7105ml   Route: Intravitreal, Site: Right Eye   NDC: P321340550242-060-01, Lot: [id number], Expiration date: 07/19/2022   Post-op Post injection exam found visual acuity of at least counting fingers. The patient tolerated the procedure well. There were no complications. The patient received written and verbal post procedure care education. Post injection medications were not given.      Intravitreal Injection, Pharmacologic Agent - OS - Left Eye       Time Out 06/22/2022. 2:39 PM. Confirmed correct patient, procedure, site, and patient consented.   Anesthesia Topical anesthesia was used. Anesthetic medications included Lidocaine 2%, Proparacaine 0.5%.   Procedure Preparation included 5% betadine to ocular surface, eyelid speculum. A supplied (32g) needle was used.   Injection: 1.25 mg Bevacizumab 1.25mg /0.9705ml   Route: Intravitreal, Site: Left Eye   NDC: P321340550242-060-01, Lot: [id number], Expiration date: 07/27/2022    Post-op Post injection exam found visual acuity of at least counting fingers. The patient tolerated the procedure well. There were no complications. The patient received written and verbal post procedure care education. Post injection medications were not given.            ASSESSMENT/PLAN:    ICD-10-CM   1. Exudative age-related macular degeneration of left eye with active choroidal neovascularization (HCC)  H35.3221 OCT, Retina - OU - Both Eyes    Intravitreal Injection, Pharmacologic Agent - OS - Left Eye    Bevacizumab (AVASTIN) SOLN 1.25 mg    2. Exudative age-related macular degeneration of right eye with active choroidal neovascularization (HCC)  H35.3211 OCT, Retina - OU - Both Eyes    Intravitreal Injection, Pharmacologic Agent - OD - Right Eye    Bevacizumab (AVASTIN) SOLN 1.25 mg    3. Essential hypertension  I10     4. Hypertensive retinopathy of both eyes  H35.033     5. Pseudophakia of both eyes  Z96.1     6. Dry eyes  H04.123      1. Exudative age-related macular degeneration, left eye   - OCT 4.30.2020 showed interval conversion of OS from nonexudative ARMD to exu-ARMD with large  dome-shaped PED with overlying IRF/SRF  - FA 05.28.20 -- no active CNV OS, just staining  - s/p IVA OS #1 (04.30.20), #2 (05.28.20), #3 (06.26.20), #4 (08.03.20), #5 (09.11.20), #6 (10.19.20), #7 (11.23.20),  - reactivation of CNV noted on 05.05.22 -- s/p IVA OS  #8 (05.05.22), #9 (06.06.22), #10 (07.19.22), #11 (09.13.22), #12 (10.25.22), #13 (12.13.22), #14 (01.30.23), #15 (3.20.23), #16 (05.08.23), #17 (06.19.23), #18 (08.07.23), #19 (10.02.23)  - **Interval increase in IRF overlying PED at 8 wks on 09.13.22 visit**  - BCVA OS stable at 20/60   - OCT shows OS: stable improvement in IRF/cystic changes, mild interval improvement in [facility] / PED inferior to fovea at 7 wks  - recommend IVA OS #20 today, 11.20.23 with f/u extended to 8 weeks   - pt wishes to proceed with injection  - RBA  of procedure discussed, questions answered  - IVA informed consent obtained and re-signed, 05.08.23 (OU)  - see procedure note  - benefits investigation for [employer] initiated 4.30.2020 -- approved for 2022  - f/u in 8 wks -- DFE/OCT, possible injection -- tx and ext as able  2. Age related macular degeneration, exudative, OD  - interval development of IRF first noted on 09.11.20 -- conversion from nonexudative to exudative ARMD  - pt initially presented on 11.27.19 due to alert from [facility] home monitoring system for OD  - [facility] prescribed by Dr. Elmer Picker  - FA (5.28.2020) showed staining / window defect corresponding temporal RPE changes OU -- no active CNV OU  - S/P IVA OD #1 (09.11.20), #2 (10.19.20), #3 (11.23.20), #4 (01.07.21), #5 (2.19.21), #6 (04.02.21), #7 (05.28.21), #8 (07.23.21), #9 (09.24.21), #10 (11.29.21), #11 (02.11.22), #12 (05.05.22), #13 (7.19.22), #14 (09.13.22), #15 (10.02.23)  - OCT shows OD: Interval improvement in cystic changes nasal and temporal fovea, patchy ORA / GA at 7 wks  - BCVA OD stable at 20/30   - recommend IVA OD #16 today, 11.20.23 w/ f/u in 8 wks  - pt wishes to proceed with injection  - RBA of procedure discussed, questions answered - IVA informed consent obtained and signed, 05.08.23 (OU) - see procedure note  - f/u 8 weeks DFE, OCT  3,4. Hypertensive retinopathy OU  - discussed importance of tight BP control  - monitor  5. Pseudophakia OU  - s/p CE/IOL OU  - beautiful surgeries, doing well  - monitor  6. Dry eyes OU  - recommend artificial tears and lubricating ointment as needed   Ophthalmic Meds Ordered this visit:  Meds ordered this encounter  Medications   Bevacizumab (AVASTIN) SOLN 1.25 mg   Bevacizumab (AVASTIN) SOLN 1.25 mg     Return in about 8 weeks (around 08/17/2022) for f/u exu ARMD OU, DFE, OCT.  There are no Patient Instructions on file for this visit.  This document serves as a record of services personally performed  by Karie Chimera, MD, PhD. It was created on their behalf by Annalee Genta, COMT. The creation of this record is the provider's dictation and/or activities during the visit.  Electronically signed by: Annalee Genta, COMT 06/22/22 11:47 PM  This document serves as a record of services personally performed by Karie Chimera, MD, PhD. It was created on their behalf by Glee Arvin. Manson Passey, OA an ophthalmic technician. The creation of this record is the provider's dictation and/or activities during the visit.    Electronically signed by: Glee Arvin. Manson Passey, [place] 11.20.2023 11:47 PM  Karie Chimera, M.D., Ph.D. Diseases & Surgery of the  Retina and Vitreous [facility]  I have reviewed the above documentation for accuracy and completeness, and I agree with the above. Karie Chimera, M.D., Ph.D. 06/22/22 11:47 PM  Abbreviations: M myopia (nearsighted); A astigmatism; H hyperopia (farsighted); P presbyopia; Mrx spectacle prescription;  CTL contact lenses; OD right eye; OS left eye; OU both eyes  XT exotropia; ET esotropia; PEK punctate epithelial keratitis; PEE punctate epithelial erosions; DES dry eye syndrome; MGD meibomian gland dysfunction; ATs artificial tears; PFAT's preservative free artificial tears; NSC nuclear sclerotic cataract; PSC posterior subcapsular cataract; ERM epi-retinal membrane; PVD posterior vitreous detachment; RD retinal detachment; DM diabetes mellitus; DR diabetic retinopathy; NPDR non-proliferative diabetic retinopathy; PDR proliferative diabetic retinopathy; CSME clinically significant macular edema; DME diabetic macular edema; dbh dot blot hemorrhages; CWS cotton wool spot; POAG primary open angle glaucoma; C/D cup-to-disc ratio; HVF humphrey visual field; GVF goldmann visual field; OCT optical coherence tomography; IOP intraocular pressure; BRVO Branch retinal vein occlusion; CRVO central retinal vein occlusion; CRAO central retinal artery occlusion; BRAO branch  retinal artery occlusion; RT retinal tear; SB scleral buckle; PPV pars plana vitrectomy; VH Vitreous hemorrhage; PRP panretinal laser photocoagulation; IVK intravitreal kenalog; VMT vitreomacular traction; MH Macular hole;  NVD neovascularization of the disc; NVE neovascularization elsewhere; AREDS age related eye disease study; ARMD age related macular degeneration; POAG primary open angle glaucoma; EBMD epithelial/anterior basement membrane dystrophy; ACIOL anterior chamber intraocular lens; IOL intraocular lens; PCIOL posterior chamber intraocular lens; Phaco/IOL phacoemulsification with intraocular lens placement; PRK photorefractive keratectomy; LASIK laser assisted in situ keratomileusis; HTN hypertension; DM diabetes mellitus; COPD chronic obstructive pulmonary disease

## 2022-06-18 NOTE — Assessment & Plan Note (Signed)
Positive on UA for nitrites, LE and small blood. Will treat with cipro 250 mg BID for 5 days. Pt will call back if sx do not improve or worsen.

## 2022-06-22 ENCOUNTER — Encounter (INDEPENDENT_AMBULATORY_CARE_PROVIDER_SITE_OTHER): Payer: Self-pay | Admitting: Ophthalmology

## 2022-06-22 ENCOUNTER — Ambulatory Visit (INDEPENDENT_AMBULATORY_CARE_PROVIDER_SITE_OTHER): Payer: Medicare Other | Admitting: Ophthalmology

## 2022-06-22 DIAGNOSIS — Z961 Presence of intraocular lens: Secondary | ICD-10-CM | POA: Diagnosis not present

## 2022-06-22 DIAGNOSIS — I10 Essential (primary) hypertension: Secondary | ICD-10-CM | POA: Diagnosis not present

## 2022-06-22 DIAGNOSIS — H353112 Nonexudative age-related macular degeneration, right eye, intermediate dry stage: Secondary | ICD-10-CM

## 2022-06-22 DIAGNOSIS — H353211 Exudative age-related macular degeneration, right eye, with active choroidal neovascularization: Secondary | ICD-10-CM

## 2022-06-22 DIAGNOSIS — H353231 Exudative age-related macular degeneration, bilateral, with active choroidal neovascularization: Secondary | ICD-10-CM

## 2022-06-22 DIAGNOSIS — H35033 Hypertensive retinopathy, bilateral: Secondary | ICD-10-CM

## 2022-06-22 DIAGNOSIS — H04123 Dry eye syndrome of bilateral lacrimal glands: Secondary | ICD-10-CM

## 2022-06-22 DIAGNOSIS — H353221 Exudative age-related macular degeneration, left eye, with active choroidal neovascularization: Secondary | ICD-10-CM

## 2022-06-22 MED ORDER — BEVACIZUMAB CHEMO INJECTION 1.25MG/0.05ML SYRINGE FOR KALEIDOSCOPE
1.2500 mg | INTRAVITREAL | Status: AC | PRN
  Administered 2022-06-22: 1.25 mg via INTRAVITREAL

## 2022-06-30 ENCOUNTER — Ambulatory Visit: Payer: Medicare Other | Admitting: Internal Medicine

## 2022-06-30 ENCOUNTER — Ambulatory Visit: Payer: Medicare Other | Admitting: Gastroenterology

## 2022-06-30 ENCOUNTER — Encounter: Payer: Self-pay | Admitting: Internal Medicine

## 2022-06-30 VITALS — BP 110/60 | HR 54 | Ht 63.0 in | Wt 121.0 lb

## 2022-06-30 DIAGNOSIS — R935 Abnormal findings on diagnostic imaging of other abdominal regions, including retroperitoneum: Secondary | ICD-10-CM

## 2022-06-30 DIAGNOSIS — K859 Acute pancreatitis without necrosis or infection, unspecified: Secondary | ICD-10-CM

## 2022-06-30 DIAGNOSIS — R1013 Epigastric pain: Secondary | ICD-10-CM | POA: Diagnosis not present

## 2022-06-30 DIAGNOSIS — K862 Cyst of pancreas: Secondary | ICD-10-CM

## 2022-06-30 NOTE — Patient Instructions (Signed)
_______________________________________________________  If you are age 85 or older, your body mass index should be between 23-30. Your Body mass index is 21.43 kg/m. If this is out of the aforementioned range listed, please consider follow up with your Primary Care Provider.  If you are age 74 or younger, your body mass index should be between 19-25. Your Body mass index is 21.43 kg/m. If this is out of the aformentioned range listed, please consider follow up with your Primary Care Provider.   ________________________________________________________  The [facility] providers would like to encourage you to use [facility] to communicate with providers for non-urgent requests or questions.  Due to long hold times on the telephone, sending your provider a message by [facility] may be a faster and more efficient way to get a response.  Please allow 48 business hours for a response.  Please remember that this is for non-urgent requests.  _______________________________________________________  Please follow up as needed

## 2022-06-30 NOTE — Progress Notes (Signed)
HISTORY OF PRESENT ILLNESS:  Kaitlyn Parks is a delightful 87 y.o. female with past medical history as listed below who presents today for posthospital evaluation after a bout of mild pancreatitis.  Patient was last seen in this office by the GI physician assistant in March 2023 for E. coli associated diarrhea.  The patient was admitted to the hospital June 13, 2022 after several day history of persistent abdominal pain.  Seen by PCP several days prior to admission and given PPI, MiraLAX, and Zofran.  Evaluation in the emergency room revealed essentially normal liver test.  Normal lipase.  Lipase 2 months earlier mildly elevated at 75.  CT imaging with peripancreatic inflammation consistent with pancreatitis.  She was given pain medicine, IV fluids, nil by mouth.  Discharged home 2 days later.  Has been on restricted diet.  Saw her PCP 06/18/2022.  She tells me that today was her best day.  Minimal discomfort.  Tolerating restricted diet.  Did have [place] for Thanksgiving.  Watching fats.  She does not use alcohol.  Imaging did not demonstrate gallstones.  No medications which commonly cause pancreatitis.  No family history.  She did undergo MRI/MRCP June 13, 2022.  She was again found to have [facility] pancreatic inflammatory changes.  Her anatomy was that of pancreatic divisum.  In the region of the head/uncinate process there was a multiseptated cystic lesion measuring less than 2 cm.  This was seen in 2018 and not significantly different.  No further follow-up recommended given her age and stability.  Overall her weight has been stable.  Lost a few pounds since her hospitalization.  She has a number of questions including the cause of this problem, will come back, is a cancer, what can I eat???  REVIEW OF SYSTEMS:  All non-GI ROS negative unless otherwise stated in the HPI except for arthritis  Past Medical History:  Diagnosis Date   Arthritis    DDD, scoliosis, sees Dr. Arnoldo Morale for this,  uses norco very rarely for pain   Bradycardia 11/11/2017   CAD (coronary artery disease)    LAD stenting of a 90% lesion 2012   Cystocele    Educated about COVID-19 virus infection 12/06/2019   Elevated cholesterol    GERD (gastroesophageal reflux disease)    dx in work up 2016 for atypical CP at OSH   pt. denies   Heart murmur    Hypertensive retinopathy    OU   Hypothyroidism    Interstitial cystitis    sees Dr. Jeffie Pollock   Macular degeneration    OU   NSTEMI (non-ST elevated myocardial infarction) [facility])    Osteoporosis    Rectocele    S/P hip replacement, right 06/12/2017   Scoliosis    Thyroid disease    Hypothyroid   Urinary incontinence    USI   Uterine prolapse     Past Surgical History:  Procedure Laterality Date   BLADDER SUSPENSION  2011   CATARACT EXTRACTION Bilateral 2015   Dr. Herbert Deaner   CORONARY ANGIOPLASTY WITH STENT PLACEMENT  04/21/2010   LAD 80%, RCA 30%, nl EF, s/p DES LAD   EYE SURGERY     LEFT HEART CATH AND CORONARY ANGIOGRAPHY N/A 06/14/2017   Procedure: LEFT HEART CATH AND CORONARY ANGIOGRAPHY;  Surgeon: Lorretta Harp, MD;  Location: [facility];  Service: Cardiovascular;  Laterality: N/A;   OOPHORECTOMY  2011   BSO   TOTAL HIP ARTHROPLASTY Right 06/10/2017   Procedure: RIGHT TOTAL HIP  ARTHROPLASTY ANTERIOR APPROACH;  Surgeon: Rod Can, MD;  Location: [facility];  Service: Orthopedics;  Laterality: Right;  Needs RNFA   VAGINAL HYSTERECTOMY  2011   [facility] BSO; benign    Social History Kaitlyn Parks  reports that she has never smoked. She has never used smokeless tobacco. She reports that she does not drink alcohol and does not use drugs.  family history includes Arthritis in her daughter; Breast cancer in her cousin, paternal aunt, and paternal aunt; Colon cancer (age of onset: 52) in her paternal aunt; Diabetes in her sister; Heart disease in her brother, father, mother, and sister; Hypertension in her mother; Leukemia in her paternal aunt;  Lung cancer in her paternal grandfather and sister; Scoliosis in her sister; Uterine cancer in her sister.  Allergies  Allergen Reactions   Acyclovir And Related Other (See Comments)    unknown   Pravachol [Pravastatin Sodium] Other (See Comments)    cystitis   Sulfa Antibiotics     nausea   Zocor [Simvastatin] Other (See Comments)    cystitis       PHYSICAL EXAMINATION: Vital signs: BP 110/60   Pulse (!) 54   Ht 5' 3" (1.6 m)   Wt 121 lb (54.9 kg)   BMI 21.43 kg/m   Constitutional: Thin but generally well-appearing, no acute distress Psychiatric: alert and oriented x3, cooperative Eyes: extraocular movements intact, anicteric, conjunctiva pink Mouth: oral pharynx moist, no lesions Neck: supple no lymphadenopathy Cardiovascular: heart regular rate and rhythm, no murmur Lungs: clear to auscultation bilaterally Abdomen: soft, nontender, nondistended, no obvious ascites, no peritoneal signs, normal bowel sounds, no organomegaly Rectal: Omitted Extremities: no lower extremity edema bilaterally Skin: no lesions on visible extremities Neuro: No focal deficits. No asterixis.    ASSESSMENT:  1.  Acute pancreatitis.  Mild to moderate.  Likely secondary to pancreas divisum. 2.  Chronic stable cystic lesion of the pancreas.  This is not cancer 3.  Negative colonoscopy 2007 with Dr. Sharlett Iles.  More recently negative Cologuard. 4.  General medical problems   PLAN:  1.  Extensive discussion on pancreas divisum including diagrams. 2.  Low-fat diet.  When feeling well can resume previous diet 3.  Contact the office or go to the hospital for severe recurrent pain 4.  Resume general medical care with PCP  A total time of 40 minutes was spent preparing to see the patient, reviewing a myriad of hospital records, x-rays, laboratories.  Obtaining comprehensive history, performing medically appropriate physical examination, counseling and educating the patient regarding the above  listed issues, answering multiple questions, directing dietary measures, and documenting clinical information in the health record.

## 2022-07-01 ENCOUNTER — Telehealth: Payer: Self-pay | Admitting: Family Medicine

## 2022-07-01 DIAGNOSIS — N3001 Acute cystitis with hematuria: Secondary | ICD-10-CM

## 2022-07-01 NOTE — Telephone Encounter (Signed)
Pt says her UTI has returned and she is requesting another prescription of cipro or another medication

## 2022-07-02 ENCOUNTER — Other Ambulatory Visit: Payer: Medicare Other

## 2022-07-02 DIAGNOSIS — N3001 Acute cystitis with hematuria: Secondary | ICD-10-CM

## 2022-07-02 NOTE — Telephone Encounter (Signed)
Lab order re-entered per approval from PCP as the lab could not see this since previously entered as routine and not future labs.  Teams message sent to [place].

## 2022-07-02 NOTE — Telephone Encounter (Signed)
I called the patient and scheduled a lab appt today at 1:20pm.  Message sent to PCP for orders.

## 2022-07-02 NOTE — Telephone Encounter (Signed)
Can she come in just to give a urine culture? If so then I can order the lab test for her

## 2022-07-02 NOTE — Addendum Note (Signed)
Addended by: Johnella Moloney on: 07/02/2022 01:13 PM   Modules accepted: Orders

## 2022-07-02 NOTE — Telephone Encounter (Signed)
Left a detailed message at the patient's cell number with the information below and requested she call the office to schedule a lab appointment if she able to come in for the urine culture.

## 2022-07-02 NOTE — Addendum Note (Signed)
Addended by: Karie Georges on: 07/02/2022 12:02 PM   Modules accepted: Orders

## 2022-07-03 LAB — URINE CULTURE
MICRO NUMBER:: 14252316
SPECIMEN QUALITY:: ADEQUATE

## 2022-07-06 NOTE — Progress Notes (Signed)
Urine culture is negative -- if she continues to have trouble I would send her to urology

## 2022-08-04 ENCOUNTER — Ambulatory Visit: Payer: Medicare Other | Admitting: Physician Assistant

## 2022-08-06 ENCOUNTER — Ambulatory Visit (INDEPENDENT_AMBULATORY_CARE_PROVIDER_SITE_OTHER): Payer: Medicare Other

## 2022-08-06 VITALS — Ht 63.0 in | Wt 121.0 lb

## 2022-08-06 DIAGNOSIS — Z0000 Encounter for general adult medical examination without abnormal findings: Secondary | ICD-10-CM

## 2022-08-06 NOTE — Patient Instructions (Addendum)
[name] , Thank you for taking time to come for your Medicare Wellness Visit. I appreciate your ongoing commitment to your health goals. Please review the following plan we discussed and let me know if I can assist you in the future.   These are the goals we discussed:  Goals       Exercise 150 min/wk Moderate Activity      Patient wants to maintain current exercise program.      Exercise 150 minutes per week (moderate activity)      Walk more consistently Will walk 1st thing in the am       Patient stated (pt-stated)      I want to walk with more erect posture.        This is a list of the screening recommended for you and due dates:  Health Maintenance  Topic Date Due   COVID-19 Vaccine (6 - 2023-24 season) 08/22/2022*   Mammogram  10/16/2022   Medicare Annual Wellness Visit  08/07/2023   DTaP/Tdap/Td vaccine (4 - Td or Tdap) 03/04/2028   Pneumonia Vaccine  Completed   Flu Shot  Completed   DEXA scan (bone density measurement)  Completed   Zoster (Shingles) Vaccine  Completed   HPV Vaccine  Aged Out  *Topic was postponed. The date shown is not the original due date.    Advanced directives: In Chart  Conditions/risks identified: None  Next appointment: Follow up in one year for your annual wellness visit     Preventive Care 65 Years and Older, Female Preventive care refers to lifestyle choices and visits with your health care provider that can promote health and wellness. What does preventive care include? A yearly physical exam. This is also called an annual well check. Dental exams once or twice a year. Routine eye exams. Ask your health care provider how often you should have your eyes checked. Personal lifestyle choices, including: Daily care of your teeth and gums. Regular physical activity. Eating a healthy diet. Avoiding tobacco and drug use. Limiting alcohol use. Practicing safe sex. Taking low-dose aspirin every day. Taking vitamin and mineral  supplements as recommended by your health care provider. What happens during an annual well check? The services and screenings done by your health care provider during your annual well check will depend on your age, overall health, lifestyle risk factors, and family history of disease. Counseling  Your health care provider may ask you questions about your: Alcohol use. Tobacco use. Drug use. Emotional well-being. Home and relationship well-being. Sexual activity. Eating habits. History of falls. Memory and ability to understand (cognition). Work and work Statistician. Reproductive health. Screening  You may have the following tests or measurements: Height, weight, and BMI. Blood pressure. Lipid and cholesterol levels. These may be checked every 5 years, or more frequently if you are over 52 years old. Skin check. Lung cancer screening. You may have this screening every year starting at age 102 if you have a 30-pack-year history of smoking and currently smoke or have quit within the past 15 years. Fecal occult blood test (FOBT) of the stool. You may have this test every year starting at age 26. Flexible sigmoidoscopy or colonoscopy. You may have a sigmoidoscopy every 5 years or a colonoscopy every 10 years starting at age 20. Hepatitis C blood test. Hepatitis B blood test. Sexually transmitted disease (STD) testing. Diabetes screening. This is done by checking your blood sugar (glucose) after you have not eaten for a while (fasting). You  may have this done every 1-3 years. Bone density scan. This is done to screen for osteoporosis. You may have this done starting at age 41. Mammogram. This may be done every 1-2 years. Talk to your health care provider about how often you should have regular mammograms. Talk with your health care provider about your test results, treatment options, and if necessary, the need for more tests. Vaccines  Your health care provider may recommend certain  vaccines, such as: Influenza vaccine. This is recommended every year. Tetanus, diphtheria, and acellular pertussis (Tdap, Td) vaccine. You may need a Td booster every 10 years. Zoster vaccine. You may need this after age 71. Pneumococcal 13-valent conjugate (PCV13) vaccine. One dose is recommended after age 64. Pneumococcal polysaccharide (PPSV23) vaccine. One dose is recommended after age 1. Talk to your health care provider about which screenings and vaccines you need and how often you need them. This information is not intended to replace advice given to you by your health care provider. Make sure you discuss any questions you have with your health care provider. Document Released: 08/16/2015 Document Revised: 04/08/2016 Document Reviewed: 05/21/2015 Elsevier Interactive Patient Education  2017 [place] Prevention in the Home Falls can cause injuries. They can happen to people of all ages. There are many things you can do to make your home safe and to help prevent falls. What can I do on the outside of my home? Regularly fix the edges of walkways and driveways and fix any cracks. Remove anything that might make you trip as you walk through a door, such as a raised step or threshold. Trim any bushes or trees on the path to your home. Use bright outdoor lighting. Clear any walking paths of anything that might make someone trip, such as rocks or tools. Regularly check to see if handrails are loose or broken. Make sure that both sides of any steps have handrails. Any raised decks and porches should have guardrails on the edges. Have any leaves, snow, or ice cleared regularly. Use sand or salt on walking paths during winter. Clean up any spills in your garage right away. This includes oil or grease spills. What can I do in the bathroom? Use night lights. Install grab bars by the toilet and in the tub and shower. Do not use towel bars as grab bars. Use non-skid mats or decals in  the tub or shower. If you need to sit down in the shower, use a plastic, non-slip stool. Keep the floor dry. Clean up any water that spills on the floor as soon as it happens. Remove soap buildup in the tub or shower regularly. Attach bath mats securely with double-sided non-slip rug tape. Do not have throw rugs and other things on the floor that can make you trip. What can I do in the bedroom? Use night lights. Make sure that you have a light by your bed that is easy to reach. Do not use any sheets or blankets that are too big for your bed. They should not hang down onto the floor. Have a firm chair that has side arms. You can use this for support while you get dressed. Do not have throw rugs and other things on the floor that can make you trip. What can I do in the kitchen? Clean up any spills right away. Avoid walking on wet floors. Keep items that you use a lot in easy-to-reach places. If you need to reach something above you, use a strong  step stool that has a grab bar. Keep electrical cords out of the way. Do not use floor polish or wax that makes floors slippery. If you must use wax, use non-skid floor wax. Do not have throw rugs and other things on the floor that can make you trip. What can I do with my stairs? Do not leave any items on the stairs. Make sure that there are handrails on both sides of the stairs and use them. Fix handrails that are broken or loose. Make sure that handrails are as long as the stairways. Check any carpeting to make sure that it is firmly attached to the stairs. Fix any carpet that is loose or worn. Avoid having throw rugs at the top or bottom of the stairs. If you do have throw rugs, attach them to the floor with carpet tape. Make sure that you have a light switch at the top of the stairs and the bottom of the stairs. If you do not have them, ask someone to add them for you. What else can I do to help prevent falls? Wear shoes that: Do not have high  heels. Have rubber bottoms. Are comfortable and fit you well. Are closed at the toe. Do not wear sandals. If you use a stepladder: Make sure that it is fully opened. Do not climb a closed stepladder. Make sure that both sides of the stepladder are locked into place. Ask someone to hold it for you, if possible. Clearly mark and make sure that you can see: Any grab bars or handrails. First and last steps. Where the edge of each step is. Use tools that help you move around (mobility aids) if they are needed. These include: Canes. Walkers. Scooters. Crutches. Turn on the lights when you go into a dark area. Replace any light bulbs as soon as they burn out. Set up your furniture so you have a clear path. Avoid moving your furniture around. If any of your floors are uneven, fix them. If there are any pets around you, be aware of where they are. Review your medicines with your doctor. Some medicines can make you feel dizzy. This can increase your chance of falling. Ask your doctor what other things that you can do to help prevent falls. This information is not intended to replace advice given to you by your health care provider. Make sure you discuss any questions you have with your health care provider. Document Released: 05/16/2009 Document Revised: 12/26/2015 Document Reviewed: 08/24/2014 Elsevier Interactive Patient Education  2017 [employer].

## 2022-08-06 NOTE — Progress Notes (Signed)
Subjective:   Kaitlyn Parks is a 88 y.o. female who presents for Medicare Annual (Subsequent) preventive examination.  Review of Systems    Virtual Visit via Telephone Note  I connected with  Kaitlyn Parks on 08/06/22 at  9:45 AM EST by telephone and verified that I am speaking with the correct person using two identifiers.  Location: Patient: Home Provider: Office Persons participating in the virtual visit: patient/Nurse Health Advisor   I discussed the limitations, risks, security and privacy concerns of performing an evaluation and management service by telephone and the availability of in person appointments. The patient expressed understanding and agreed to proceed.  Interactive audio and video telecommunications were attempted between this nurse and patient, however failed, due to patient having technical difficulties OR patient did not have access to video capability.  We continued and completed visit with audio only.  Some vital signs may be absent or patient reported.   Criselda Peaches, LPN  Cardiac Risk Factors include: advanced age (>45men, >67 women);hypertension     Objective:    Today's Vitals   08/06/22 1010  Weight: 121 lb (54.9 kg)  Height: 5\' 3"  (1.6 m)   Body mass index is 21.43 kg/m.     08/06/2022   10:16 AM 06/13/2022    2:20 PM 06/12/2022    8:59 PM 04/23/2022   12:52 AM 03/03/2022   12:32 PM 08/06/2021   10:12 AM 11/30/2017    9:33 AM  Advanced Directives  Does Patient Have a Medical Advance Directive? Yes No No No Yes Yes Yes  Type of Paramedic of [place];Living will    [place];Living will [place];Living will   Does patient want to make changes to medical advance directive? No - Patient declined     No - Patient declined   Copy of [place] in Chart? Yes - validated most recent copy scanned in chart (See row information)    No - copy requested No - copy  requested   Would patient like information on creating a medical advance directive?  No - Patient declined No - Patient declined        Current Medications (verified) Outpatient Encounter Medications as of 08/06/2022  Medication Sig   alum & mag hydroxide-simeth (MAALOX PLUS) 400-400-40 MG/5ML suspension Take 15 mLs by mouth every 6 (six) hours as needed for indigestion.   amLODipine (NORVASC) 2.5 MG tablet Take 1 tablet (2.5 mg total) by mouth daily.   aspirin EC 81 MG tablet Take 81 mg by mouth daily.   Biotin w/ Vitamins C & E (HAIR/SKIN/NAILS PO) Take 1 tablet by mouth daily.   Cranberry 400 MG CAPS Take 400 mg by mouth daily.   hydrALAZINE (APRESOLINE) 10 MG tablet TAKE (1) TABLET TWICE A DAY. MAY TAKE EXTRA DOSE FOR SBP >160 UP TO A TOTAL OF 4 TIMES A DAY. (Patient taking differently: TAKE (1) TABLET TWICE A DAY. MAY TAKE EXTRA DOSE FOR SBP >160 UP TO A TOTAL OF 4 TIMES A DAY. As Needed)   ibuprofen (ADVIL) 400 MG tablet Take 400 mg by mouth every 6 (six) hours as needed for mild pain.   isosorbide mononitrate (IMDUR) 60 MG 24 hr tablet Take 90 mg by mouth daily. One and one half tablet daily   Multiple Vitamins-Calcium (ONE-A-DAY WOMENS PO) Take 1 tablet by mouth daily.    nitrofurantoin, macrocrystal-monohydrate, (MACROBID) 100 MG capsule Take 100 mg by mouth daily  as needed (UTI). Takes as Needed   nitroGLYCERIN (NITROSTAT) 0.4 MG SL tablet DISSOLVE 1 TABLET UNDER TONGUE IF NEEDED FOR CHEST PAIN. MAY REPEAT IN 5 MINUTES FOR 3 DOSES.   ondansetron (ZOFRAN-ODT) 4 MG disintegrating tablet Take 1 tablet (4 mg total) by mouth every 8 (eight) hours as needed for nausea or vomiting.   pantoprazole (PROTONIX) 40 MG tablet Take 1 tablet (40 mg total) by mouth daily.   Polyethyl Glycol-Propyl Glycol (SYSTANE) 0.4-0.3 % GEL ophthalmic gel Place 1 Application into both eyes daily as needed (dry eyes).   PRESCRIPTION MEDICATION Avastin eye injections given by Dr Coralyn Pear due to macular degeneration    rosuvastatin (CRESTOR) 10 MG tablet TABLET ONE-HALF TABLET BY MOUTH DAILY (Patient taking differently: Take 5 mg by mouth daily.)   SYNTHROID 50 MCG tablet TAKE 1 TABLET EACH DAY. (Patient taking differently: Take 50 mcg by mouth daily before breakfast. TAKE 1 TABLET EACH DAY.)   Facility-Administered Encounter Medications as of 08/06/2022  Medication   technetium tetrofosmin (TC-MYOVIEW) injection 16.1 millicurie    Allergies (verified) Acyclovir and related, Pravachol [pravastatin sodium], Sulfa antibiotics, and Zocor [simvastatin]   History: Past Medical History:  Diagnosis Date   Arthritis    DDD, scoliosis, sees Dr. Arnoldo Morale for this, uses norco very rarely for pain   Bradycardia 11/11/2017   CAD (coronary artery disease)    LAD stenting of a 90% lesion 2012   Cystocele    Educated about COVID-19 virus infection 12/06/2019   Elevated cholesterol    GERD (gastroesophageal reflux disease)    dx in work up 2016 for atypical CP at OSH   pt. denies   Heart murmur    Hypertensive retinopathy    OU   Hypothyroidism    Interstitial cystitis    sees Dr. Jeffie Pollock   Macular degeneration    OU   NSTEMI (non-ST elevated myocardial infarction) [facility])    Osteoporosis    Rectocele    S/P hip replacement, right 06/12/2017   Scoliosis    Thyroid disease    Hypothyroid   Urinary incontinence    USI   Uterine prolapse    Past Surgical History:  Procedure Laterality Date   BLADDER SUSPENSION  2011   CATARACT EXTRACTION Bilateral 2015   Dr. Herbert Deaner   CORONARY ANGIOPLASTY WITH STENT PLACEMENT  04/21/2010   LAD 80%, RCA 30%, nl EF, s/p DES LAD   EYE SURGERY     LEFT HEART CATH AND CORONARY ANGIOGRAPHY N/A 06/14/2017   Procedure: LEFT HEART CATH AND CORONARY ANGIOGRAPHY;  Surgeon: Lorretta Harp, MD;  Location: [facility];  Service: Cardiovascular;  Laterality: N/A;   OOPHORECTOMY  2011   BSO   TOTAL HIP ARTHROPLASTY Right 06/10/2017   Procedure: RIGHT TOTAL HIP ARTHROPLASTY  ANTERIOR APPROACH;  Surgeon: Rod Can, MD;  Location: [facility];  Service: Orthopedics;  Laterality: Right;  Needs RNFA   VAGINAL HYSTERECTOMY  2011   LAVH BSO; benign   Family History  Problem Relation Age of Onset   Hypertension Mother    Heart disease Mother    Heart disease Father    Heart disease Sister    Diabetes Sister    Uterine cancer Sister        mets to lungs   Lung cancer Sister    Scoliosis Sister    Heart disease Brother    Colon cancer Paternal Aunt 74   Breast cancer Paternal 72        Age  17's   Breast cancer Paternal Aunt    Leukemia Paternal Aunt    Lung cancer Paternal Grandfather        smoker   Arthritis Daughter    Breast cancer Cousin        Maternal 1st cousins-Age 57's   Esophageal cancer Neg Hx    Pancreatic cancer Neg Hx    Stomach cancer Neg Hx    Social History   Socioeconomic History   Marital status: Widowed    Spouse name: Not on file   Number of children: 2   Years of education: Not on file   Highest education level: Not on file  Occupational History   Occupation: retired  Tobacco Use   Smoking status: Never   Smokeless tobacco: Never  Vaping Use   Vaping Use: Never used  Substance and Sexual Activity   Alcohol use: No    Comment: Rare   Drug use: No   Sexual activity: Never    Birth control/protection: Surgical  Other Topics Concern   Not on file  Social History Narrative   Work or School:  none      Home Situation: lives with husband      Spiritual Beliefs: [employer]      Lifestyle: regular exercise (yoga, water aerobics); diet is healthy      Social Determinants of Health   Financial Resource Strain: Low Risk  (08/06/2022)   Overall Financial Resource Strain (CARDIA)    Difficulty of Paying Living Expenses: Not hard at all  Food Insecurity: No Food Insecurity (08/06/2022)   Hunger Vital Sign    Worried About Running Out of Food in the Last Year: Never true    [place] in the Last Year: Never true   Transportation Needs: No Transportation Needs (08/06/2022)   PRAPARE - Hydrologist (Medical): No    Lack of Transportation (Non-Medical): No  Physical Activity: Sufficiently Active (08/06/2022)   Exercise Vital Sign    Days of Exercise per Week: 4 days    Minutes of Exercise per Session: 130 min  Stress: No Stress Concern Present (08/06/2022)   [place]    Feeling of Stress : Not at all  Social Connections: Moderately Integrated (08/06/2022)   Social Connection and Isolation Panel [NHANES]    Frequency of Communication with Friends and Family: More than three times a week    Frequency of Social Gatherings with Friends and Family: More than three times a week    Attends Religious Services: More than 4 times per year    Active Member of [employer] or Organizations: Yes    Attends Archivist Meetings: More than 4 times per year    Marital Status: Widowed    Tobacco Counseling Counseling given: Not Answered   Clinical Intake:  Pre-visit preparation completed: Yes  Pain : No/denies pain     BMI - recorded: 21.43 Nutritional Status: BMI of 19-24  Normal Nutritional Risks: None Diabetes: No  How often do you need to have someone help you when you read instructions, pamphlets, or other written materials from your doctor or pharmacy?: 1 - Never  Diabetic?  No  Interpreter Needed?: No  Information entered by :: Rolene Arbour LPN   Activities of Daily Living    08/06/2022   10:15 AM 08/02/2022    9:36 PM  In your present state of health, do you have any difficulty performing the  following activities:  Hearing? 0 0  Vision? 0 1  Difficulty concentrating or making decisions? 0   Walking or climbing stairs? 0 0  Dressing or bathing? 0 0  Doing errands, shopping? 0 0  Preparing Food and eating ? N N  Using the Toilet? N N  In the past six months, have you accidently leaked  urine? N Y  Do you have problems with loss of bowel control? N Y  Managing your Medications? N   Managing your Finances? N N  Housekeeping or managing your Housekeeping? N Y    Patient Care Team: Karie Georges, MD as PCP - General (Family Medicine) Rollene Rotunda, MD as PCP - Cardiology (Cardiology) Carlus Pavlov, MD as Consulting Physician (Internal Medicine) Tressie Stalker, MD as Consulting Physician (Neurosurgery) Bjorn Pippin, MD as Attending Physician (Urology) Duke, Roe Rutherford, PA as Physician Assistant (Cardiology)  Indicate any recent Medical Services you may have received from other than [facility] providers in the past year (date may be approximate).     Assessment:   This is a routine wellness examination for Kaitlyn Parks.  Hearing/Vision screen Hearing Screening - Comments:: Denies hearing difficulties   Vision Screening - Comments:: Wears rx glasses - up to date with routine eye exams with  Dr Elmer Picker  Dietary issues and exercise activities discussed: Exercise limited by: None identified   Goals Addressed               This Visit's Progress     Patient stated (pt-stated)        I want to walk with more erect posture.       Depression Screen    08/06/2022   10:14 AM 08/13/2021   10:32 AM 08/06/2021   10:05 AM 09/11/2020    9:03 AM 03/11/2020   10:02 AM 11/30/2017    9:36 AM 08/11/2016    2:03 PM  PHQ 2/9 Scores  PHQ - 2 Score 0 0 0 0 0 0 0  PHQ- 9 Score  4         Fall Risk    08/06/2022   10:16 AM 08/02/2022    9:36 PM 08/13/2021   10:32 AM 08/04/2021    7:41 PM 09/11/2020    9:03 AM  Fall Risk   Falls in the past year? 0 0 1 0 0  Number falls in past yr: 0 0 1 0 0  Injury with Fall? 0 0 0  0  Risk for fall due to : No Fall Risks      Follow up Falls prevention discussed        FALL RISK PREVENTION PERTAINING TO THE HOME:  Any stairs in or around the home? Yes  If so, are there any without handrails? No  Home free of loose throw rugs in walkways,  pet beds, electrical cords, etc? Yes  Adequate lighting in your home to reduce risk of falls? Yes   ASSISTIVE DEVICES UTILIZED TO PREVENT FALLS:  Life alert? No  Use of a cane, walker or w/c? No  Grab bars in the bathroom? No  Shower chair or bench in shower? No  Elevated toilet seat or a handicapped toilet? Yes   TIMED UP AND GO:  Was the test performed? No . Audio Visit  Cognitive Function:        08/06/2022   10:18 AM 08/06/2021   10:11 AM  6CIT Screen  What Year? 0 points 0 points  What month? 0 points 0 points  What time? 0 points 0 points  Count back from 20 0 points 0 points  Months in reverse 0 points 0 points  Repeat phrase 0 points 0 points  Total Score 0 points 0 points    Immunizations Immunization History  Administered Date(s) Administered   Influenza Whole 08/11/2007   Influenza, High Dose Seasonal PF 08/06/2014, 04/30/2017, 06/20/2018, 04/17/2019, 05/07/2021   Influenza,inj,Quad PF,6+ Mos 04/24/2013   Influenza-Unspecified 06/03/2016, 05/04/2019, 04/03/2020, 04/24/2022   PFIZER Comirnaty(Gray Top)Covid-19 Tri-Sucrose Vaccine 05/02/2022   PFIZER(Purple Top)SARS-COV-2 Vaccination 08/13/2019, 09/02/2019, 11/02/2019   Pfizer Covid-19 Vaccine Bivalent Booster 29yrs & up 05/07/2021   Pneumococcal Conjugate-13 02/19/2014   Pneumococcal Polysaccharide-23 09/13/2007   Td 11/04/1995, 09/13/2007   Tdap 03/04/2018   Zoster Recombinat (Shingrix) 03/04/2018, 05/10/2018    TDAP status: Up to date  Flu Vaccine status: Up to date  Pneumococcal vaccine status: Up to date  Covid-19 vaccine status: Completed vaccines  Qualifies for Shingles Vaccine? Yes   Zostavax completed Yes   Shingrix Completed?: Yes  Screening Tests Health Maintenance  Topic Date Due   COVID-19 Vaccine (6 - 2023-24 season) 08/22/2022 (Originally 06/27/2022)   MAMMOGRAM  10/16/2022   Medicare Annual Wellness (AWV)  08/07/2023   DTaP/Tdap/Td (4 - Td or Tdap) 03/04/2028   Pneumonia Vaccine  51+ Years old  Completed   INFLUENZA VACCINE  Completed   DEXA SCAN  Completed   Zoster Vaccines- Shingrix  Completed   HPV VACCINES  Aged Out    Health Maintenance  There are no preventive care reminders to display for this patient.   Colorectal cancer screening: No longer required.   Mammogram status: No longer required due to Age.  Bone Density status: Completed 09/28/18. Results reflect: Bone density results: OSTEOPOROSIS. Repeat every   years.  Lung Cancer Screening: (Low Dose CT Chest recommended if Age 4-80 years, 30 pack-year currently smoking OR have quit w/in 15years.) does not qualify.     Additional Screening:  Hepatitis C Screening: does not qualify; Completed   Vision Screening: Recommended annual ophthalmology exams for early detection of glaucoma and other disorders of the eye. Is the patient up to date with their annual eye exam?  Yes  Who is the provider or what is the name of the office in which the patient attends annual eye exams? Dr Herbert Deaner If pt is not established with a provider, would they like to be referred to a provider to establish care? No .   Dental Screening: Recommended annual dental exams for proper oral hygiene  Community Resource Referral / Chronic Care Management:  CRR required this visit?  No   CCM required this visit?  No      Plan:     I have personally reviewed and noted the following in the patient's chart:   Medical and social history Use of alcohol, tobacco or illicit drugs  Current medications and supplements including opioid prescriptions. Patient is not currently taking opioid prescriptions. Functional ability and status Nutritional status Physical activity Advanced directives List of other physicians Hospitalizations, surgeries, and ER visits in previous 12 months Vitals Screenings to include cognitive, depression, and falls Referrals and appointments  In addition, I have reviewed and discussed with patient  certain preventive protocols, quality metrics, and best practice recommendations. A written personalized care plan for preventive services as well as general preventive health recommendations were provided to patient.     PERRIS TRIPATHI, LPN   10/03/2023   Nurse Notes: None

## 2022-08-12 NOTE — Progress Notes (Addendum)
[facility] - Clinic Note  08/17/2022    CHIEF COMPLAINT Patient presents for Retina Follow Up  HISTORY OF PRESENT ILLNESS: Kaitlyn Parks is a 88 y.o. female who presents to the clinic today for:   HPI     Retina Follow Up   Patient presents with  Wet AMD.  In left eye.  This started months ago.  Severity is moderate.  Duration of 8 weeks.  Since onset it is stable.  I, the attending physician,  performed the HPI with the patient and updated documentation appropriately.        Comments   Patient feels that the vision in the left eye is declining. She is having a hard time reading. She is complaining of dry eyes. She is using AT's OU PRN, Gel OU QHS      Last edited by Rennis Chris, MD on 08/17/2022  5:16 PM.    Pt states left eye is much drier than the right, she states she can barely open her left eye when she wakes up in the morning, pt has an appt with Dr. Bascom Levels coming up  Referring physician: Mateo Flow, MD [address] [place],  [place] [address]  HISTORICAL INFORMATION:  Selected notes from the MEDICAL RECORD NUMBER Referred by Dr. [place] DeMarco for concern of exu ARMD LEE: 11.26.19 (J. 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   No current facility-administered medications for this visit. (Ophthalmic Drugs)   Current Outpatient Medications (Other)  Medication Sig   alum & mag hydroxide-simeth (MAALOX PLUS) 400-400-40 MG/5ML suspension Take 15 mLs by mouth every 6 (six) hours as needed for indigestion.   amLODipine (NORVASC) 2.5 MG tablet Take 1 tablet (2.5 mg total) by mouth daily.   aspirin EC 81 MG tablet Take  81 mg by mouth daily.   Biotin w/ Vitamins C & E (HAIR/SKIN/NAILS PO) Take 1 tablet by mouth daily.   Cranberry 400 MG CAPS Take 400 mg by mouth daily.   hydrALAZINE (APRESOLINE) 10 MG tablet TAKE (1) TABLET TWICE A DAY. MAY TAKE EXTRA DOSE FOR SBP >160 UP TO A TOTAL OF 4 TIMES A DAY. (Patient taking differently: TAKE (1) TABLET TWICE A DAY. MAY TAKE EXTRA DOSE FOR SBP >160 UP TO A TOTAL OF 4 TIMES A DAY. As Needed)   ibuprofen (ADVIL) 400 MG tablet Take 400 mg by mouth every 6 (six) hours as needed for mild pain.   isosorbide mononitrate (IMDUR) 60 MG 24 hr tablet Take 90 mg by mouth daily. One and one half tablet daily   Multiple Vitamins-Calcium (ONE-A-DAY WOMENS PO) Take 1 tablet by mouth daily.    nitrofurantoin, macrocrystal-monohydrate, (MACROBID) 100 MG capsule Take 100 mg by mouth daily as needed (UTI). Takes as Needed   nitroGLYCERIN (NITROSTAT) 0.4 MG SL tablet DISSOLVE 1 TABLET UNDER TONGUE IF NEEDED FOR CHEST PAIN. MAY REPEAT IN 5 MINUTES FOR 3 DOSES.   ondansetron (ZOFRAN-ODT) 4 MG disintegrating tablet Take 1 tablet (4 mg total) by mouth every 8 (eight) hours as needed for nausea or vomiting.   pantoprazole (PROTONIX) 40 MG tablet Take 1 tablet (40 mg total) by mouth daily.   PRESCRIPTION MEDICATION Avastin eye injections given by  Dr Coralyn Pear due to macular degeneration   rosuvastatin (CRESTOR) 10 MG tablet TABLET ONE-HALF TABLET BY MOUTH DAILY (Patient taking differently: Take 5 mg by mouth daily.)   SYNTHROID 50 MCG tablet TAKE 1 TABLET EACH DAY. (Patient taking differently: Take 50 mcg by mouth daily before breakfast. TAKE 1 TABLET EACH DAY.)   No current facility-administered medications for this visit. (Other)   Facility-Administered Medications Ordered in Other Visits (Other)  Medication Route   technetium tetrofosmin (TC-MYOVIEW) injection 54.2 millicurie Intravenous   REVIEW OF SYSTEMS: ROS   Positive for: Gastrointestinal, Genitourinary, Musculoskeletal, Cardiovascular,  Eyes Negative for: Constitutional, Neurological, Skin, HENT, Endocrine, Respiratory, Psychiatric, Allergic/Imm, Heme/Lymph Last edited by Annie Paras, COT on 08/17/2022  1:27 PM.     ALLERGIES Allergies  Allergen Reactions   Acyclovir And Related Other (See Comments)    unknown   Pravachol [Pravastatin Sodium] Other (See Comments)    cystitis   Sulfa Antibiotics     nausea   Zocor [Simvastatin] Other (See Comments)    cystitis   PAST MEDICAL HISTORY Past Medical History:  Diagnosis Date   Arthritis    DDD, scoliosis, sees Dr. Arnoldo Morale for this, uses norco very rarely for pain   Bradycardia 11/11/2017   CAD (coronary artery disease)    LAD stenting of a 90% lesion 2012   Cystocele    Educated about COVID-19 virus infection 12/06/2019   Elevated cholesterol    GERD (gastroesophageal reflux disease)    dx in work up 2016 for atypical CP at OSH   pt. denies   Heart murmur    Hypertensive retinopathy    OU   Hypothyroidism    Interstitial cystitis    sees Dr. Jeffie Pollock   Macular degeneration    OU   NSTEMI (non-ST elevated myocardial infarction) [facility])    Osteoporosis    Rectocele    S/P hip replacement, right 06/12/2017   Scoliosis    Thyroid disease    Hypothyroid   Urinary incontinence    USI   Uterine prolapse    Past Surgical History:  Procedure Laterality Date   BLADDER SUSPENSION  2011   CATARACT EXTRACTION Bilateral 2015   Dr. Herbert Deaner   CORONARY ANGIOPLASTY WITH STENT PLACEMENT  04/21/2010   LAD 80%, RCA 30%, nl EF, s/p DES LAD   EYE SURGERY     LEFT HEART CATH AND CORONARY ANGIOGRAPHY N/A 06/14/2017   Procedure: LEFT HEART CATH AND CORONARY ANGIOGRAPHY;  Surgeon: Lorretta Harp, MD;  Location: [facility];  Service: Cardiovascular;  Laterality: N/A;   OOPHORECTOMY  2011   BSO   TOTAL HIP ARTHROPLASTY Right 06/10/2017   Procedure: RIGHT TOTAL HIP ARTHROPLASTY ANTERIOR APPROACH;  Surgeon: Rod Can, MD;  Location: [facility];  Service:  Orthopedics;  Laterality: Right;  Needs RNFA   VAGINAL HYSTERECTOMY  2011   LAVH BSO; benign   FAMILY HISTORY Family History  Problem Relation Age of Onset   Hypertension Mother    Heart disease Mother    Heart disease Father    Heart disease Sister    Diabetes Sister    Uterine cancer Sister        mets to lungs   Lung cancer Sister    Scoliosis Sister    Heart disease Brother    Colon cancer Paternal Aunt 15   Breast cancer Paternal 86        Age 37's   Breast cancer Paternal Aunt    Leukemia Paternal Aunt  Lung cancer Paternal Grandfather        smoker   Arthritis Daughter    Breast cancer Cousin        Maternal 1st cousins-Age 62's   Esophageal cancer Neg Hx    Pancreatic cancer Neg Hx    Stomach cancer Neg Hx    SOCIAL HISTORY Social History   Tobacco Use   Smoking status: Never   Smokeless tobacco: Never  Vaping Use   Vaping Use: Never used  Substance Use Topics   Alcohol use: No    Comment: Rare   Drug use: No       OPHTHALMIC EXAM: Base Eye Exam     Visual Acuity (Snellen - Linear)       Right Left   Dist [place] 20/30 20/70   Dist ph  NI 20/60         Tonometry (Tonopen, 1:30 PM)       Right Left   Pressure 13 12         Pupils       Dark Light Shape React APD   Right 3 2 Round Brisk None   Left 3 2 Round Brisk None         Visual Fields       Left Right    Full Full         Extraocular Movement       Right Left    Full, Ortho Full, Ortho         Neuro/Psych     Oriented x3: Yes   Mood/Affect: Normal         Dilation     Both eyes: 1.0% Mydriacyl, 2.5% Phenylephrine @ 1:27 PM           Slit Lamp and Fundus Exam     Slit Lamp Exam       Right Left   Lids/Lashes mild Telangiectasia, marginal lesion nasal UL Telangiectasia, Meibomian gland dysfunction, lower lid edema   Conjunctiva/Sclera White and quiet White and quiet, inferior conj chalasis   Cornea Arcus, 1+ Punctate epithelial erosions, fine  endo pigment 1+ punctate epithelial erosions, Arcus, trace, fine endo pigment   Anterior Chamber Deep and clear Deep and clear   Iris Round and dilated Round and well dilated   Lens PC IOL in good position, trace Posterior capsular opacification (linear, extending just to visual axis) PC IOL in excellent position   Anterior Vitreous Vitreous syneresis, Posterior vitreous detachment Vitreous syneresis, Posterior vitreous detachment, vitreous condensations         Fundus Exam       Right Left   Disc Pink and Sharp, Compact, focal PPP Pink and Sharp, mild temporal PPA/PPP   C/D Ratio 0.3 0.3   Macula Blunted foveal reflex, Drusen, RPE mottling and clumping, early Atrophy, +PEDs -- stably improved, trace cystic changes -- stably improved, No frank heme Blunted foveal reflex, +drusen, pigment clumping, RPE mottling, clumping and atrophy, no heme, central PED / CNV with overlying cystic changes -- persistent, +GA   Vessels attenuated, Tortuous attenuated, Tortuous   Periphery Attached, scattered reticular degeneration   Attached, scattered reticular degeneration           IMAGING AND PROCEDURES  Imaging and Procedures for @TODAY @  OCT, Retina - OU - Both Eyes       Right Eye Quality was good. Central Foveal Thickness: 237. Progression has been stable. Findings include no IRF, no SRF, abnormal foveal contour, retinal drusen , intraretinal  hyper-reflective material, pigment epithelial detachment, outer retinal atrophy (stable improvement in cystic changes nasal and temporal fovea, patchy ORA / GA).   Left Eye Quality was good. Central Foveal Thickness: 254. Progression has been stable. Findings include normal foveal contour, no SRF, retinal drusen , intraretinal hyper-reflective material, intraretinal fluid, pigment epithelial detachment, outer retinal atrophy (Interval re-development of IRF/cystic changes inferior fovea, persistent SRHM / PED inferior to fovea).   Notes *Images captured  and stored on drive  Diagnosis / Impression:  OD: exudative ARMD -- Interval improvement in cystic changes nasal and temporal fovea, patchy ORA / GA OS: exu-ARMD -- Interval re-development of IRF/cystic changes inferior fovea, persistent SRHM / PED inferior to fovea  Clinical management:  See below  Abbreviations: NFP - Normal foveal profile. CME - cystoid macular edema. PED - pigment epithelial detachment. IRF - intraretinal fluid. SRF - subretinal fluid. EZ - ellipsoid zone. ERM - epiretinal membrane. ORA - outer retinal atrophy. ORT - outer retinal tubulation. SRHM - subretinal hyper-reflective material      Intravitreal Injection, Pharmacologic Agent - OS - Left Eye       Time Out 08/17/2022. 2:18 PM. Confirmed correct patient, procedure, site, and patient consented.   Anesthesia Topical anesthesia was used. Anesthetic medications included Lidocaine 2%, Proparacaine 0.5%.   Procedure Preparation included 5% betadine to ocular surface, eyelid speculum. A (32g) needle was used.   Injection: 1.25 mg Bevacizumab 1.25mg /0.43ml   Route: Intravitreal, Site: Left Eye   NDC: P3213405, Lot: [id number], Expiration date: 09/01/2022   Post-op Post injection exam found visual acuity of at least counting fingers. The patient tolerated the procedure well. There were no complications. The patient received written and verbal post procedure care education. Post injection medications were not given.      Intravitreal Injection, Pharmacologic Agent - OD - Right Eye       Time Out 08/17/2022. 2:37 PM. Confirmed correct patient, procedure, site, and patient consented.   Anesthesia Topical anesthesia was used. Anesthetic medications included Lidocaine 2%, Proparacaine 0.5%.   Procedure Preparation included 5% betadine to ocular surface, eyelid speculum. A (32g) needle was used.   Injection: 1.25 mg Bevacizumab 1.25mg /0.84ml   Route: Intravitreal, Site: Right Eye   NDC: P3213405, Lot:  [id number], Expiration date: 10/02/2022   Post-op Post injection exam found visual acuity of at least counting fingers. The patient tolerated the procedure well. There were no complications. The patient received written and verbal post procedure care education. Post injection medications were not given.            ASSESSMENT/PLAN:    ICD-10-CM   1. Exudative age-related macular degeneration of left eye with active choroidal neovascularization (HCC)  H35.3221 OCT, Retina - OU - Both Eyes    Intravitreal Injection, Pharmacologic Agent - OS - Left Eye    Bevacizumab (AVASTIN) SOLN 1.25 mg    2. Exudative age-related macular degeneration of right eye with active choroidal neovascularization (HCC)  H35.3211 Intravitreal Injection, Pharmacologic Agent - OD - Right Eye    Bevacizumab (AVASTIN) SOLN 1.25 mg    3. Essential hypertension  I10     4. Hypertensive retinopathy of both eyes  H35.033     5. Pseudophakia of both eyes  Z96.1     6. Dry eyes  H04.123      1. Exudative age-related macular degeneration, left eye   - OCT 4.30.2020 showed interval conversion of OS from nonexudative ARMD to exu-ARMD with large dome-shaped PED with overlying IRF/SRF  -  FA 05.28.20 -- no active CNV OS, just staining  - s/p IVA OS #1 (04.30.20), #2 (05.28.20), #3 (06.26.20), #4 (08.03.20), #5 (09.11.20), #6 (10.19.20), #7 (11.23.20),  - reactivation of CNV noted on 05.05.22 -- s/p IVA OS  #8 (05.05.22), #9 (06.06.22), #10 (07.19.22), #11 (09.13.22), #12 (10.25.22), #13 (12.13.22), #14 (01.30.23), #15 (3.20.23), #16 (05.08.23), #17 (06.19.23), #18 (08.07.23), #19 (10.02.23), #20 (11.20.23)  - **Interval increase in IRF overlying PED at 8 wks on 09.13.22 visit**  - BCVA OS stable at 20/60   - OCT shows OS: Interval re-development of IRF/cystic changes inferior fovea, persistent SRHM / PED inferior to fovea at 8 weeks  - recommend IVA OS #21 today, 01.15.24 with f/u at 8 weeks   - pt wishes to proceed with  injection  - RBA of procedure discussed, questions answered  - IVA informed consent obtained and re-signed, 05.08.23 (OU)  - see procedure note   - benefits investigation for [employer] initiated 4.30.2020 -- approved for 2024  - f/u in 8 wks -- DFE/OCT, possible injection -- tx and ext as able  2. Age related macular degeneration, exudative, OD  - interval development of IRF first noted on 09.11.20 -- conversion from nonexudative to exudative ARMD  - pt initially presented on 11.27.19 due to alert from [facility] home monitoring system for OD  - [facility] prescribed by Dr. Elmer Picker  - FA (5.28.2020) showed staining / window defect corresponding temporal RPE changes OU -- no active CNV OU  - S/P IVA OD #1 (09.11.20), #2 (10.19.20), #3 (11.23.20), #4 (01.07.21), #5 (2.19.21), #6 (04.02.21), #7 (05.28.21), #8 (07.23.21), #9 (09.24.21), #10 (11.29.21), #11 (02.11.22), #12 (05.05.22), #13 (7.19.22), #14 (09.13.22), #15 (10.02.23), #16 (11.20.23)  - OCT shows Interval improvement in cystic changes nasal and temporal fovea, patchy ORA / GA at 8 weeks  - BCVA OD stable at 20/30   - recommend IVA OD #17 today, 01.15.24 w/ f/u in 8 wks  - pt wishes to proceed with injection  - RBA of procedure discussed, questions answered - IVA informed consent obtained and signed, 05.08.23 (OU) - see procedure note  - f/u 8 weeks DFE, OCT  3,4. Hypertensive retinopathy OU  - discussed importance of tight BP control  - monitor  5. Pseudophakia OU  - s/p CE/IOL OU  - beautiful surgeries, doing well  - monitor  6. Dry eyes OU  - recommend artificial tears and lubricating ointment as needed   Ophthalmic Meds Ordered this visit:  Meds ordered this encounter  Medications   Bevacizumab (AVASTIN) SOLN 1.25 mg   Bevacizumab (AVASTIN) SOLN 1.25 mg     Return in about 8 weeks (around 10/12/2022) for f/u exu ARMD OU, DFE, OCT.  There are no Patient Instructions on file for this visit.  This document serves as a record  of services personally performed by Karie Chimera, MD, PhD. It was created on their behalf by De Blanch, an ophthalmic technician. The creation of this record is the provider's dictation and/or activities during the visit.    Electronically signed by: De Blanch, OA, 08/19/22  5:23 PM  This document serves as a record of services personally performed by Karie Chimera, MD, PhD. It was created on their behalf by Glee Arvin. Manson Passey, OA an ophthalmic technician. The creation of this record is the provider's dictation and/or activities during the visit.    Electronically signed by: Glee Arvin. Manson Passey, [place] 01.15.2024 5:23 PM  Karie Chimera, M.D., Ph.D. Diseases & Surgery of the  Retina and Vitreous [facility]  I have reviewed the above documentation for accuracy and completeness, and I agree with the above. Karie Chimera, M.D., Ph.D. 08/19/22 5:23 PM  Abbreviations: M myopia (nearsighted); A astigmatism; H hyperopia (farsighted); P presbyopia; Mrx spectacle prescription;  CTL contact lenses; OD right eye; OS left eye; OU both eyes  XT exotropia; ET esotropia; PEK punctate epithelial keratitis; PEE punctate epithelial erosions; DES dry eye syndrome; MGD meibomian gland dysfunction; ATs artificial tears; PFAT's preservative free artificial tears; NSC nuclear sclerotic cataract; PSC posterior subcapsular cataract; ERM epi-retinal membrane; PVD posterior vitreous detachment; RD retinal detachment; DM diabetes mellitus; DR diabetic retinopathy; NPDR non-proliferative diabetic retinopathy; PDR proliferative diabetic retinopathy; CSME clinically significant macular edema; DME diabetic macular edema; dbh dot blot hemorrhages; CWS cotton wool spot; POAG primary open angle glaucoma; C/D cup-to-disc ratio; HVF humphrey visual field; GVF goldmann visual field; OCT optical coherence tomography; IOP intraocular pressure; BRVO Branch retinal vein occlusion; CRVO central retinal vein  occlusion; CRAO central retinal artery occlusion; BRAO branch retinal artery occlusion; RT retinal tear; SB scleral buckle; PPV pars plana vitrectomy; VH Vitreous hemorrhage; PRP panretinal laser photocoagulation; IVK intravitreal kenalog; VMT vitreomacular traction; MH Macular hole;  NVD neovascularization of the disc; NVE neovascularization elsewhere; AREDS age related eye disease study; ARMD age related macular degeneration; POAG primary open angle glaucoma; EBMD epithelial/anterior basement membrane dystrophy; ACIOL anterior chamber intraocular lens; IOL intraocular lens; PCIOL posterior chamber intraocular lens; Phaco/IOL phacoemulsification with intraocular lens placement; PRK photorefractive keratectomy; LASIK laser assisted in situ keratomileusis; HTN hypertension; DM diabetes mellitus; COPD chronic obstructive pulmonary disease

## 2022-08-17 ENCOUNTER — Encounter (INDEPENDENT_AMBULATORY_CARE_PROVIDER_SITE_OTHER): Payer: Self-pay | Admitting: Ophthalmology

## 2022-08-17 ENCOUNTER — Ambulatory Visit (INDEPENDENT_AMBULATORY_CARE_PROVIDER_SITE_OTHER): Payer: Medicare Other | Admitting: Ophthalmology

## 2022-08-17 DIAGNOSIS — H353211 Exudative age-related macular degeneration, right eye, with active choroidal neovascularization: Secondary | ICD-10-CM

## 2022-08-17 DIAGNOSIS — I10 Essential (primary) hypertension: Secondary | ICD-10-CM | POA: Diagnosis not present

## 2022-08-17 DIAGNOSIS — H35033 Hypertensive retinopathy, bilateral: Secondary | ICD-10-CM

## 2022-08-17 DIAGNOSIS — Z961 Presence of intraocular lens: Secondary | ICD-10-CM | POA: Diagnosis not present

## 2022-08-17 DIAGNOSIS — H04123 Dry eye syndrome of bilateral lacrimal glands: Secondary | ICD-10-CM

## 2022-08-17 DIAGNOSIS — H353231 Exudative age-related macular degeneration, bilateral, with active choroidal neovascularization: Secondary | ICD-10-CM | POA: Diagnosis not present

## 2022-08-17 DIAGNOSIS — H353221 Exudative age-related macular degeneration, left eye, with active choroidal neovascularization: Secondary | ICD-10-CM

## 2022-08-17 DIAGNOSIS — H353112 Nonexudative age-related macular degeneration, right eye, intermediate dry stage: Secondary | ICD-10-CM

## 2022-08-17 MED ORDER — BEVACIZUMAB CHEMO INJECTION 1.25MG/0.05ML SYRINGE FOR KALEIDOSCOPE
1.2500 mg | INTRAVITREAL | Status: AC | PRN
  Administered 2022-08-17: 1.25 mg via INTRAVITREAL

## 2022-09-10 NOTE — Progress Notes (Signed)
Cardiology Office Note   Date:  09/11/2022   ID:  Kaitlyn, Parks 12-16-1934, MRN [id number]  PCP:  Farrel Conners, MD  Cardiologist:   Minus Breeding, MD   Chief Complaint  Patient presents with   Coronary Artery Disease     History of Present Illness: Kaitlyn Parks is a 88 y.o. female who presents for followup up of CAD.  She had a PCI of LAD in 2012. She recently underwent total right hip arthroplasty. She presented to the hospital on 06/12/2017 with sudden onset of right-sided neck pain with radiation to the right jaw. CTA of the chest was negative for PE however troponin was elevated at 0.42. Echocardiogram was obtained on 06/13/2017 showed EF 60-65%, mild LVH, grade 1 DD, mild MR, mild TR, peak PA pressure 30 mmHg. Cardiac catheterization performed on 06/14/2017 showed 95% ostial D1 lesion, widely patent ostial LAD stent, 30% proximal LAD stenosis. Medical therapy was recommended. Imdur 60 mg daily was added to her medical regimen. Beta blocker was initially increased and later reduced due to symptomatic bradycardia. At a previous visit I stopped the beta blocker.   She had chest pain recently and had a negative perfusion study.    She called recently with chest pain and HTN.  She gets chest discomfort that is not like her previous angina.  She did have a stress test last year that was negative for any evidence of ischemia.  She does not think she is having any new symptoms since then.  She did have pancreatitis and was in the hospital and I looked at the records for this.  As some of the discomfort she is describing which is the back pain that she gets associated with.  She can do her physical activities and do some exercise class.  She denies bringing on any symptoms with this.  She has had to take hydralazine 1 time because her blood pressure was elevated.  Typically it is well-controlled.   Past Medical History:  Diagnosis Date   Arthritis    DDD, scoliosis, sees Dr.  Arnoldo Morale for this, uses norco very rarely for pain   Bradycardia 11/11/2017   CAD (coronary artery disease)    LAD stenting of a 90% lesion 2012   Cystocele    Educated about COVID-19 virus infection 12/06/2019   Elevated cholesterol    GERD (gastroesophageal reflux disease)    dx in work up 2016 for atypical CP at OSH   pt. denies   Heart murmur    Hypertensive retinopathy    OU   Hypothyroidism    Interstitial cystitis    sees Dr. Jeffie Pollock   Macular degeneration    OU   NSTEMI (non-ST elevated myocardial infarction) [facility])    Osteoporosis    Rectocele    S/P hip replacement, right 06/12/2017   Scoliosis    Thyroid disease    Hypothyroid   Urinary incontinence    USI   Uterine prolapse     Past Surgical History:  Procedure Laterality Date   BLADDER SUSPENSION  2011   CATARACT EXTRACTION Bilateral 2015   Dr. Herbert Deaner   CORONARY ANGIOPLASTY WITH STENT PLACEMENT  04/21/2010   LAD 80%, RCA 30%, nl EF, s/p DES LAD   EYE SURGERY     LEFT HEART CATH AND CORONARY ANGIOGRAPHY N/A 06/14/2017   Procedure: LEFT HEART CATH AND CORONARY ANGIOGRAPHY;  Surgeon: Lorretta Harp, MD;  Location: [facility];  Service: Cardiovascular;  Laterality: N/A;   OOPHORECTOMY  2011   BSO   TOTAL HIP ARTHROPLASTY Right 06/10/2017   Procedure: RIGHT TOTAL HIP ARTHROPLASTY ANTERIOR APPROACH;  Surgeon: Rod Can, MD;  Location: [facility];  Service: Orthopedics;  Laterality: Right;  Needs RNFA   VAGINAL HYSTERECTOMY  2011   [facility] BSO; benign     Current Outpatient Medications  Medication Sig Dispense Refill   alum & mag hydroxide-simeth (MAALOX PLUS) 400-400-40 MG/5ML suspension Take 15 mLs by mouth every 6 (six) hours as needed for indigestion. 355 mL 0   amLODipine (NORVASC) 2.5 MG tablet Take 1 tablet (2.5 mg total) by mouth daily. 90 tablet 3   aspirin EC 81 MG tablet Take 81 mg by mouth daily.     Biotin w/ Vitamins C & E (HAIR/SKIN/NAILS PO) Take 1 tablet by mouth daily.     Cranberry 400  MG CAPS Take 400 mg by mouth daily.     hydrALAZINE (APRESOLINE) 10 MG tablet TAKE (1) TABLET TWICE A DAY. MAY TAKE EXTRA DOSE FOR SBP >160 UP TO A TOTAL OF 4 TIMES A DAY. (Patient taking differently: TAKE (1) TABLET TWICE A DAY. MAY TAKE EXTRA DOSE FOR SBP >160 UP TO A TOTAL OF 4 TIMES A DAY. As Needed) 180 tablet 3   ibuprofen (ADVIL) 400 MG tablet Take 400 mg by mouth every 6 (six) hours as needed for mild pain.     isosorbide mononitrate (IMDUR) 60 MG 24 hr tablet Take 90 mg by mouth daily. One and one half tablet daily     meloxicam (MOBIC) 7.5 MG tablet Take 1 tablet every day by oral route with meal(s) for 30 days.     Multiple Vitamins-Calcium (ONE-A-DAY WOMENS PO) Take 1 tablet by mouth daily.      nitrofurantoin, macrocrystal-monohydrate, (MACROBID) 100 MG capsule Take 100 mg by mouth daily as needed (UTI). Takes as Needed     nitroGLYCERIN (NITROSTAT) 0.4 MG SL tablet DISSOLVE 1 TABLET UNDER TONGUE IF NEEDED FOR CHEST PAIN. MAY REPEAT IN 5 MINUTES FOR 3 DOSES. 25 tablet 3   ondansetron (ZOFRAN-ODT) 4 MG disintegrating tablet Take 1 tablet (4 mg total) by mouth every 8 (eight) hours as needed for nausea or vomiting. 30 tablet 0   pantoprazole (PROTONIX) 40 MG tablet Take 1 tablet (40 mg total) by mouth daily. 30 tablet 3   Polyethyl Glycol-Propyl Glycol (SYSTANE) 0.4-0.3 % GEL ophthalmic gel Place 1 Application into both eyes daily as needed (dry eyes).     PRESCRIPTION MEDICATION Avastin eye injections given by Dr Coralyn Pear due to macular degeneration     rosuvastatin (CRESTOR) 10 MG tablet TABLET ONE-HALF TABLET BY MOUTH DAILY (Patient taking differently: Take 5 mg by mouth daily.) 45 tablet 3   SYNTHROID 50 MCG tablet TAKE 1 TABLET EACH DAY. (Patient taking differently: Take 50 mcg by mouth daily before breakfast. TAKE 1 TABLET EACH DAY.) 90 tablet 1   No current facility-administered medications for this visit.   Facility-Administered Medications Ordered in Other Visits  Medication Dose  Route Frequency Provider Last Rate Last Admin   technetium tetrofosmin (TC-MYOVIEW) injection 99991111 millicurie  99991111 millicurie Intravenous Once PRN Turner, Eber Hong, MD        Allergies:   Acyclovir and related, Pravachol [pravastatin sodium], Sulfa antibiotics, and Zocor [simvastatin]    ROS:  Please see the history of present illness.   Otherwise, review of systems are positive for none.   All other systems are reviewed and negative.  PHYSICAL EXAM: VS:  BP 136/60   Pulse 63   Ht 5' 3"$  (1.6 m)   Wt 121 lb 9.6 oz (55.2 kg)   SpO2 95%   BMI 21.54 kg/m  , BMI Body mass index is 21.54 kg/m. GENERAL:  Well appearing NECK:  No jugular venous distention, waveform within normal limits, carotid upstroke brisk and symmetric, no bruits, no thyromegaly LUNGS:  Clear to auscultation bilaterally CHEST:  Unremarkable HEART:  PMI not displaced or sustained,S1 and S2 within normal limits, no S3, no S4, no clicks, no rubs, no murmurs ABD:  Flat, positive bowel sounds normal in frequency in pitch, no bruits, no rebound, no guarding, no midline pulsatile mass, no hepatomegaly, no splenomegaly EXT:  2 plus pulses throughout, no edema, no cyanosis no clubbing   EKG:  EKG is not ordered today. Sinus rhythm, rate 54, axis within normal limits, intervals within normal limits, no acute ST-T wave changes, 04/23/2022  Recent Labs: 06/15/2022: ALT 22; BUN 9; Creatinine, Ser 0.61; Hemoglobin 11.1; Magnesium 2.0; Platelets 273; Potassium 4.0; Sodium 137    Lipid Panel    Component Value Date/Time   CHOL 142 04/09/2022 1443   CHOL 160 02/05/2017 0834   TRIG 117.0 04/09/2022 1443   HDL 54.30 04/09/2022 1443   HDL 52 02/05/2017 0834   CHOLHDL 3 04/09/2022 1443   VLDL 23.4 04/09/2022 1443   LDLCALC 65 04/09/2022 1443   LDLCALC 72 03/04/2020 0828   LDLDIRECT 140.6 05/25/2012 0855      Wt Readings from Last 3 Encounters:  09/11/22 121 lb 9.6 oz (55.2 kg)  08/06/22 121 lb (54.9 kg)  06/30/22 121 lb  (54.9 kg)      Other studies Reviewed: Additional studies/ records that were reviewed today include: Labs Review of the above records demonstrates:    See elsewhere   ASSESSMENT AND PLAN:    CAD, NATIVE VESSEL -  The patient has no new sypmtoms.  No further cardiovascular testing is indicated.  We will continue with aggressive risk reduction and meds as listed.  HYPERTENSION -  The blood pressure is at target. No change in medications is indicated. We will continue with therapeutic lifestyle changes (TLC).  RISK REDUCTION: Lipids are well-controlled.  Continue meds as listed.  Current medicines are reviewed at length with the patient today.  The patient does not have concerns regarding medicines.  The following changes have been made:   None  Labs/ tests ordered today include:   None  No orders of the defined types were placed in this encounter.    Disposition:   FU with me in 12 months.   See APP in six month.    Signed, Minus Breeding, MD  09/11/2022 9:36 AM    [facility]

## 2022-09-11 ENCOUNTER — Ambulatory Visit: Payer: Medicare Other | Attending: Cardiology | Admitting: Cardiology

## 2022-09-11 ENCOUNTER — Encounter: Payer: Self-pay | Admitting: Cardiology

## 2022-09-11 VITALS — BP 136/60 | HR 63 | Ht 63.0 in | Wt 121.6 lb

## 2022-09-11 DIAGNOSIS — I10 Essential (primary) hypertension: Secondary | ICD-10-CM

## 2022-09-11 DIAGNOSIS — I2510 Atherosclerotic heart disease of native coronary artery without angina pectoris: Secondary | ICD-10-CM

## 2022-09-11 NOTE — Patient Instructions (Signed)
Medication Instructions:  Your physician recommends that you continue on your current medications as directed. Please refer to the Current Medication list given to you today.  *If you need a refill on your cardiac medications before your next appointment, please call your pharmacy*   Lab Work: None If you have labs (blood work) drawn today and your tests are completely normal, you will receive your results only by: Kaitlyn Parks (if you have MyChart) OR A paper copy in the mail If you have any lab test that is abnormal or we need to change your treatment, we will call you to review the results.   Testing/Procedures: None   Follow-Up: At [facility], you and your health needs are our priority.  As part of our continuing mission to provide you with exceptional heart care, we have created designated Provider Care Teams.  These Care Teams include your primary Cardiologist (physician) and Advanced Practice Providers (APPs -  Physician Assistants and Nurse Practitioners) who all work together to provide you with the care you need, when you need it.  Your next appointment:   6 month with Fabian Sharp or 1 year Minus Breeding, MD

## 2022-10-05 NOTE — Progress Notes (Signed)
[facility] Note  10/12/2022    CHIEF COMPLAINT Patient presents for Retina Follow Up  HISTORY OF PRESENT ILLNESS: Kaitlyn Parks is a 88 y.o. female who presents to the clinic today for:   HPI     Retina Follow Up   Patient presents with  Wet AMD.  In left eye.  Severity is moderate.  Duration of 8 weeks.  Since onset it is stable.  I, the attending physician,  performed the HPI with the patient and updated documentation appropriately.        Comments   Patient states vision improved OS since changing glasses Rx. Still has some vision fluctuation due to dry eyes. Didn't bring new glasses into clinic today. Patient started new medication (Miebo) for dry eyes qid OU.       Last edited by Bernarda Caffey, MD on 10/12/2022  2:45 PM.    Pt states her left eye is declining, she has a new Rx for glasses and she still has a hard time reading, she saw Dr. Mare Ferrari and he gave her a Rx for [place]  Referring physician: Farrel Conners, MD [place],  [place] [id number]  HISTORICAL INFORMATION:  Selected notes from the MEDICAL RECORD NUMBER Referred by Dr. Martinique DeMarco for concern of exu ARMD LEE: 11.26.19 (J. 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   No current facility-administered medications for this visit. (Ophthalmic Drugs)   Current Outpatient Medications (Other)  Medication Sig   alum & mag hydroxide-simeth (MAALOX PLUS) 400-400-40 MG/5ML suspension Take 15 mLs by mouth every 6 (six) hours as needed for indigestion.   amLODipine (NORVASC) 2.5 MG tablet Take 1 tablet (2.5 mg total) by mouth daily.   aspirin EC 81  MG tablet Take 81 mg by mouth daily.   Biotin w/ Vitamins C & E (HAIR/SKIN/NAILS PO) Take 1 tablet by mouth daily.   Cranberry 400 MG CAPS Take 400 mg by mouth daily.   hydrALAZINE (APRESOLINE) 10 MG tablet TAKE (1) TABLET TWICE A DAY. MAY TAKE EXTRA DOSE FOR SBP >160 UP TO A TOTAL OF 4 TIMES A DAY. (Patient taking differently: TAKE (1) TABLET TWICE A DAY. MAY TAKE EXTRA DOSE FOR SBP >160 UP TO A TOTAL OF 4 TIMES A DAY. As Needed)   ibuprofen (ADVIL) 400 MG tablet Take 400 mg by mouth every 6 (six) hours as needed for mild pain.   isosorbide mononitrate (IMDUR) 60 MG 24 hr tablet Take 90 mg by mouth daily. One and one half tablet daily   meloxicam (MOBIC) 7.5 MG tablet Take 1 tablet every day by oral route with meal(s) for 30 days.   Multiple Vitamins-Calcium (ONE-A-DAY WOMENS PO) Take 1 tablet by mouth daily.    nitrofurantoin, macrocrystal-monohydrate, (MACROBID) 100 MG capsule Take 100 mg by mouth daily as needed (UTI). Takes as Needed   nitroGLYCERIN (NITROSTAT) 0.4 MG SL tablet DISSOLVE 1 TABLET UNDER TONGUE IF NEEDED FOR CHEST PAIN. MAY REPEAT IN 5 MINUTES FOR 3 DOSES.   ondansetron (ZOFRAN-ODT) 4 MG disintegrating tablet Take 1 tablet (4 mg total) by mouth every 8 (eight) hours as needed for nausea or vomiting.   pantoprazole (PROTONIX) 40 MG tablet  Take 1 tablet (40 mg total) by mouth daily.   PRESCRIPTION MEDICATION Avastin eye injections given by Dr Coralyn Pear due to macular degeneration   rosuvastatin (CRESTOR) 10 MG tablet TABLET ONE-HALF TABLET BY MOUTH DAILY (Patient taking differently: Take 5 mg by mouth daily.)   SYNTHROID 50 MCG tablet TAKE 1 TABLET EACH DAY. (Patient taking differently: Take 50 mcg by mouth daily before breakfast. TAKE 1 TABLET EACH DAY.)   No current facility-administered medications for this visit. (Other)   Facility-Administered Medications Ordered in Other Visits (Other)  Medication Route   technetium tetrofosmin (TC-MYOVIEW) injection 50.9 millicurie  Intravenous   REVIEW OF SYSTEMS: ROS   Positive for: Gastrointestinal, Genitourinary, Musculoskeletal, Cardiovascular, Eyes Negative for: Constitutional, Neurological, Skin, HENT, Endocrine, Respiratory, Psychiatric, Allergic/Imm, Heme/Lymph Last edited by Jobe Marker, COT on 10/12/2022  1:19 PM.     ALLERGIES Allergies  Allergen Reactions   Acyclovir And Related Other (See Comments)    unknown   Pravachol [Pravastatin Sodium] Other (See Comments)    cystitis   Sulfa Antibiotics     nausea   Zocor [Simvastatin] Other (See Comments)    cystitis   PAST MEDICAL HISTORY Past Medical History:  Diagnosis Date   Arthritis    DDD, scoliosis, sees Dr. Arnoldo Morale for this, uses norco very rarely for pain   Bradycardia 11/11/2017   CAD (coronary artery disease)    LAD stenting of a 90% lesion 2012   Cystocele    Educated about COVID-19 virus infection 12/06/2019   Elevated cholesterol    GERD (gastroesophageal reflux disease)    dx in work up 2016 for atypical CP at OSH   pt. denies   Heart murmur    Hypertensive retinopathy    OU   Hypothyroidism    Interstitial cystitis    sees Dr. Jeffie Pollock   Macular degeneration    OU   NSTEMI (non-ST elevated myocardial infarction) [facility])    Osteoporosis    Rectocele    S/P hip replacement, right 06/12/2017   Scoliosis    Thyroid disease    Hypothyroid   Urinary incontinence    USI   Uterine prolapse    Past Surgical History:  Procedure Laterality Date   BLADDER SUSPENSION  2011   CATARACT EXTRACTION Bilateral 2015   Dr. Herbert Deaner   CORONARY ANGIOPLASTY WITH STENT PLACEMENT  04/21/2010   LAD 80%, RCA 30%, nl EF, s/p DES LAD   EYE SURGERY     LEFT HEART CATH AND CORONARY ANGIOGRAPHY N/A 06/14/2017   Procedure: LEFT HEART CATH AND CORONARY ANGIOGRAPHY;  Surgeon: Lorretta Harp, MD;  Location: [facility];  Service: Cardiovascular;  Laterality: N/A;   OOPHORECTOMY  2011   BSO   TOTAL HIP ARTHROPLASTY Right 06/10/2017    Procedure: RIGHT TOTAL HIP ARTHROPLASTY ANTERIOR APPROACH;  Surgeon: Rod Can, MD;  Location: [facility];  Service: Orthopedics;  Laterality: Right;  Needs RNFA   VAGINAL HYSTERECTOMY  2011   LAV[facility] BSO; benign   FAMILY HISTORY Family History  Problem Relation Age of Onset   Hypertension Mother    Heart disease Mother    Heart disease Father    Heart disease Sister    Diabetes Sister    Uterine cancer Sister        mets to lungs   Lung cancer Sister    Scoliosis Sister    Heart disease Brother    Colon cancer Paternal Aunt 65   Breast cancer Paternal Aunt  Age 30's   Breast cancer Paternal Aunt    Leukemia Paternal Aunt    Lung cancer Paternal Grandfather        smoker   Arthritis Daughter    Breast cancer Cousin        Maternal 1st cousins-Age 21's   Esophageal cancer Neg Hx    Pancreatic cancer Neg Hx    Stomach cancer Neg Hx    SOCIAL HISTORY Social History   Tobacco Use   Smoking status: Never   Smokeless tobacco: Never  Vaping Use   Vaping Use: Never used  Substance Use Topics   Alcohol use: No    Comment: Rare   Drug use: No       OPHTHALMIC EXAM: Base Eye Exam     Visual Acuity (Snellen - Linear)       Right Left   Dist [place] 20/40 +1 20/70 -2   Dist ph [place] NI NI         Pupils       Dark Light Shape React APD   Right 3 2 Round Brisk None   Left 3 2 Round Brisk None         Visual Fields (Counting fingers)       Left Right    Full Full         Extraocular Movement       Right Left    Full, Ortho Full, Ortho         Neuro/Psych     Oriented x3: Yes   Mood/Affect: Normal           Slit Lamp and Fundus Exam     Slit Lamp Exam       Right Left   Lids/Lashes mild Telangiectasia -- improved, marginal lesion nasal UL Telangiectasia, Meibomian gland dysfunction, lower lid edema -- all improved   Conjunctiva/Sclera White and quiet White and quiet, inferior conj chalasis   Cornea Arcus, 1+ Punctate epithelial  erosions, fine endo pigment 1+ punctate epithelial erosions, Arcus, trace, fine endo pigment   Anterior Chamber Deep and clear Deep and clear   Iris Round and dilated Round and well dilated   Lens PC IOL in good position, trace Posterior capsular opacification (linear, extending just to visual axis) PC IOL in excellent position   Anterior Vitreous Vitreous syneresis, Posterior vitreous detachment Vitreous syneresis, Posterior vitreous detachment, vitreous condensations         Fundus Exam       Right Left   Disc Pink and Sharp, Compact, focal PPP Pink and Sharp, mild temporal PPA/PPP   C/D Ratio 0.3 0.3   Macula Blunted foveal reflex, Drusen, RPE mottling and clumping, early Atrophy, +PEDs -- stably improved, trace cystic changes -- stably improved, No frank heme Blunted foveal reflex, +drusen, pigment clumping, RPE mottling, clumping and atrophy, no heme, central PED / CNV with overlying cystic changes -- slightly improved, +GA   Vessels attenuated, Tortuous attenuated, Tortuous   Periphery Attached, scattered reticular degeneration   Attached, scattered reticular degeneration           Refraction     Manifest Refraction       Sphere Cylinder Axis Dist VA   Right [place] +0.50 140 20/30   Left -1.25 +0.75 152 20/40-2           IMAGING AND PROCEDURES  Imaging and Procedures for @TODAY @  OCT, Retina - OU - Both Eyes       Right Eye Quality was  good. Central Foveal Thickness: 236. Progression has been stable. Findings include no IRF, no SRF, abnormal foveal contour, retinal drusen , intraretinal hyper-reflective material, pigment epithelial detachment, outer retinal atrophy (stable improvement in cystic changes nasal and temporal fovea, patchy ORA / GA).   Left Eye Quality was good. Central Foveal Thickness: 264. Progression has improved. Findings include normal foveal contour, no SRF, retinal drusen , intraretinal hyper-reflective material, intraretinal fluid, pigment  epithelial detachment, outer retinal atrophy (Interval improvement in IRF/cystic changes inferior fovea, persistent SRHM / PED inferior to fovea).   Notes *Images captured and stored on drive  Diagnosis / Impression:  OD: exudative ARMD -- stable improvement in cystic changes nasal and temporal fovea, patchy ORA / GA OS: exu-ARMD -- Interval improvement in IRF/cystic changes inferior fovea, persistent SRHM / PED inferior to fovea  Clinical management:  See below  Abbreviations: NFP - Normal foveal profile. CME - cystoid macular edema. PED - pigment epithelial detachment. IRF - intraretinal fluid. SRF - subretinal fluid. EZ - ellipsoid zone. ERM - epiretinal membrane. ORA - outer retinal atrophy. ORT - outer retinal tubulation. SRHM - subretinal hyper-reflective material      Intravitreal Injection, Pharmacologic Agent - OS - Left Eye       Time Out 10/12/2022. 2:30 PM. Confirmed correct patient, procedure, site, and patient consented.   Anesthesia Topical anesthesia was used. Anesthetic medications included Lidocaine 2%, Proparacaine 0.5%.   Procedure Preparation included 5% betadine to ocular surface, eyelid speculum. A (32g) needle was used.   Injection: 2 mg aflibercept 2 MG/0.05ML   Route: Intravitreal, Site: Left Eye   NDC: A3590391, Lot: [id number], Expiration date: 06/03/2023, Waste: 0 mL   Post-op Post injection exam found visual acuity of at least counting fingers. The patient tolerated the procedure well. There were no complications. The patient received written and verbal post procedure care education. Post injection medications were not given.      Intravitreal Injection, Pharmacologic Agent - OD - Right Eye       Time Out 10/12/2022. 2:46 PM. Confirmed correct patient, procedure, site, and patient consented.   Anesthesia Topical anesthesia was used. Anesthetic medications included Lidocaine 2%, Proparacaine 0.5%.   Procedure Preparation included 5%  betadine to ocular surface, eyelid speculum. A (32g) needle was used.   Injection: 1.25 mg Bevacizumab 1.25mg /0.4ml   Route: Intravitreal, Site: Right Eye   NDC: H061816, Lot: [id number], Expiration date: 11/15/2022   Post-op Post injection exam found visual acuity of at least counting fingers. The patient tolerated the procedure well. There were no complications. The patient received written and verbal post procedure care education. Post injection medications were not given.            ASSESSMENT/PLAN:    ICD-10-CM   1. Exudative age-related macular degeneration of left eye with active choroidal neovascularization (HCC)  H35.3221 OCT, Retina - OU - Both Eyes    Intravitreal Injection, Pharmacologic Agent - OS - Left Eye    aflibercept (EYLEA) SOLN 2 mg    2. Exudative age-related macular degeneration of right eye with active choroidal neovascularization (HCC)  H35.3211 Intravitreal Injection, Pharmacologic Agent - OD - Right Eye    Bevacizumab (AVASTIN) SOLN 1.25 mg    3. Essential hypertension  I10     4. Hypertensive retinopathy of both eyes  H35.033     5. Pseudophakia of both eyes  Z96.1     6. Dry eyes  H04.123      1. Exudative age-related macular degeneration, left  eye   - OCT 4.30.2020 showed interval conversion of OS from nonexudative ARMD to exu-ARMD with large dome-shaped PED with overlying IRF/SRF  - FA 05.28.20 -- no active CNV OS, just staining  - s/p IVA OS #1 (04.30.20), #2 (05.28.20), #3 (06.26.20), #4 (08.03.20), #5 (09.11.20), #6 (10.19.20), #7 (11.23.20),  - reactivation of CNV noted on 05.05.22 -- s/p IVA OS  #8 (05.05.22), #9 (06.06.22), #10 (07.19.22), #11 (09.13.22), #12 (10.25.22), #13 (12.13.22), #14 (01.30.23), #15 (3.20.23), #16 (05.08.23), #17 (06.19.23), #18 (08.07.23), #19 (10.02.23), #20 (11.20.23), #21 (01.15.24)  - **Interval increase in IRF overlying PED at 8 wks on 09.13.22 visit**  - BCVA OS 20/70 -- decreased   - OCT shows OS: Interval  improvement of IRF/cystic changes inferior fovea, persistent SRHM / PED inferior to fovea at 8 weeks  - discussed IVA resistance and potential benefit of switching medication to [facility]  - recommend IVE OS #1 today, 03.11.24 with f/u at 6 weeks   - pt wishes to proceed with injection  - RBA of procedure discussed, questions answered  - IVA informed consent obtained and re-signed, 05.08.23 (OU)  - IVE informed consent obtained and signed, 03.11.24  - see procedure note   - benefits investigation for Eylea initiated 4.30.2020 -- approved for 2024  - f/u in 6 wks -- DFE/OCT, possible injection  2. Age related macular degeneration, exudative, OD  - interval development of IRF first noted on 09.11.20 -- conversion from nonexudative to exudative ARMD  - pt initially presented on 11.27.19 due to alert from [facility] home monitoring system for OD  - [facility] prescribed by Dr. Herbert Deaner  - FA (5.28.2020) showed staining / window defect corresponding temporal RPE changes OU -- no active CNV OU  - S/P IVA OD #1 (09.11.20), #2 (10.19.20), #3 (11.23.20), #4 (01.07.21), #5 (2.19.21), #6 (04.02.21), #7 (05.28.21), #8 (07.23.21), #9 (09.24.21), #10 (11.29.21), #11 (02.11.22), #12 (05.05.22), #13 (7.19.22), #14 (09.13.22), #15 (10.02.23), #16 (11.20.23), #17 (01.15.24)  - OCT shows Interval improvement in cystic changes nasal and temporal fovea, patchy ORA / GA at 8 weeks  - BCVA OD stable at 20/40 -- stable   - recommend IVA OD #18 today, 03.11.24 w/ f/u in 6 wks  - pt wishes to proceed with injection  - RBA of procedure discussed, questions answered - IVA informed consent obtained and signed, 05.08.23 (OU) - see procedure note  - f/u 6 weeks DFE, OCT  3,4. Hypertensive retinopathy OU  - discussed importance of tight BP control  - monitor  5. Pseudophakia OU  - s/p CE/IOL OU  - beautiful surgeries, doing well  - monitor  6. Dry eyes OU  - recommend artificial tears and lubricating ointment as  needed   Ophthalmic Meds Ordered this visit:  Meds ordered this encounter  Medications   aflibercept (EYLEA) SOLN 2 mg   Bevacizumab (AVASTIN) SOLN 1.25 mg     Return in about 6 weeks (around 11/23/2022) for f/u exu ARMD OU, DFE, OCT.  There are no Patient Instructions on file for this visit.  This document serves as a record of services personally performed by Gardiner Sleeper, MD, PhD. It was created on their behalf by Orvan Falconer, an ophthalmic technician. The creation of this record is the provider's dictation and/or activities during the visit.    Electronically signed by: Orvan Falconer, OA, 10/12/22  10:11 PM  This document serves as a record of services personally performed by Gardiner Sleeper, MD, PhD. It was created on their  behalf by San Jetty. Owens Shark, OA an ophthalmic technician. The creation of this record is the provider's dictation and/or activities during the visit.    Electronically signed by: San Jetty. Owens Shark, [place] 03.11.2024 10:11 PM  Gardiner Sleeper, M.D., Ph.D. Diseases & Surgery of the Retina and Vitreous [facility]  I have reviewed the above documentation for accuracy and completeness, and I agree with the above. Gardiner Sleeper, M.D., Ph.D. 10/12/22 10:13 PM   Abbreviations: M myopia (nearsighted); A astigmatism; H hyperopia (farsighted); P presbyopia; Mrx spectacle prescription;  CTL contact lenses; OD right eye; OS left eye; OU both eyes  XT exotropia; ET esotropia; PEK punctate epithelial keratitis; PEE punctate epithelial erosions; DES dry eye syndrome; MGD meibomian gland dysfunction; ATs artificial tears; PFAT's preservative free artificial tears; [place] nuclear sclerotic cataract; PSC posterior subcapsular cataract; ERM epi-retinal membrane; PVD posterior vitreous detachment; RD retinal detachment; DM diabetes mellitus; DR diabetic retinopathy; NPDR non-proliferative diabetic retinopathy; PDR proliferative diabetic retinopathy; CSME  clinically significant macular edema; DME diabetic macular edema; dbh dot blot hemorrhages; CWS cotton wool spot; POAG primary open angle glaucoma; C/D cup-to-disc ratio; HVF humphrey visual field; GVF goldmann visual field; OCT optical coherence tomography; IOP intraocular pressure; BRVO Branch retinal vein occlusion; CRVO central retinal vein occlusion; CRAO central retinal artery occlusion; BRAO branch retinal artery occlusion; RT retinal tear; SB scleral buckle; PPV pars plana vitrectomy; VH Vitreous hemorrhage; PRP panretinal laser photocoagulation; IVK intravitreal kenalog; VMT vitreomacular traction; MH Macular hole;  NVD neovascularization of the disc; NVE neovascularization elsewhere; AREDS age related eye disease study; ARMD age related macular degeneration; POAG primary open angle glaucoma; EBMD epithelial/anterior basement membrane dystrophy; ACIOL anterior chamber intraocular lens; IOL intraocular lens; PCIOL posterior chamber intraocular lens; Phaco/IOL phacoemulsification with intraocular lens placement; [place] photorefractive keratectomy; LASIK laser assisted in situ keratomileusis; HTN hypertension; DM diabetes mellitus; COPD chronic obstructive pulmonary disease

## 2022-10-12 ENCOUNTER — Ambulatory Visit (INDEPENDENT_AMBULATORY_CARE_PROVIDER_SITE_OTHER): Payer: Medicare Other | Admitting: Ophthalmology

## 2022-10-12 ENCOUNTER — Encounter (INDEPENDENT_AMBULATORY_CARE_PROVIDER_SITE_OTHER): Payer: Self-pay | Admitting: Ophthalmology

## 2022-10-12 DIAGNOSIS — H35033 Hypertensive retinopathy, bilateral: Secondary | ICD-10-CM

## 2022-10-12 DIAGNOSIS — Z961 Presence of intraocular lens: Secondary | ICD-10-CM | POA: Diagnosis not present

## 2022-10-12 DIAGNOSIS — H353231 Exudative age-related macular degeneration, bilateral, with active choroidal neovascularization: Secondary | ICD-10-CM | POA: Diagnosis not present

## 2022-10-12 DIAGNOSIS — H353211 Exudative age-related macular degeneration, right eye, with active choroidal neovascularization: Secondary | ICD-10-CM

## 2022-10-12 DIAGNOSIS — H04123 Dry eye syndrome of bilateral lacrimal glands: Secondary | ICD-10-CM

## 2022-10-12 DIAGNOSIS — H353221 Exudative age-related macular degeneration, left eye, with active choroidal neovascularization: Secondary | ICD-10-CM

## 2022-10-12 DIAGNOSIS — I10 Essential (primary) hypertension: Secondary | ICD-10-CM

## 2022-10-12 MED ORDER — AFLIBERCEPT 2MG/0.05ML IZ SOLN FOR KALEIDOSCOPE
2.0000 mg | INTRAVITREAL | Status: AC | PRN
  Administered 2022-10-12: 2 mg via INTRAVITREAL

## 2022-10-12 MED ORDER — BEVACIZUMAB CHEMO INJECTION 1.25MG/0.05ML SYRINGE FOR KALEIDOSCOPE
1.2500 mg | INTRAVITREAL | Status: AC | PRN
  Administered 2022-10-12: 1.25 mg via INTRAVITREAL

## 2022-10-21 LAB — HM MAMMOGRAPHY

## 2022-10-27 ENCOUNTER — Encounter: Payer: Self-pay | Admitting: Adult Health

## 2022-10-27 ENCOUNTER — Ambulatory Visit (INDEPENDENT_AMBULATORY_CARE_PROVIDER_SITE_OTHER): Payer: Medicare Other | Admitting: Adult Health

## 2022-10-27 ENCOUNTER — Other Ambulatory Visit: Payer: Self-pay | Admitting: Cardiology

## 2022-10-27 ENCOUNTER — Ambulatory Visit: Payer: Medicare Other | Admitting: Family Medicine

## 2022-10-27 ENCOUNTER — Other Ambulatory Visit: Payer: Self-pay | Admitting: Family Medicine

## 2022-10-27 VITALS — BP 148/62 | HR 52 | Temp 97.4°F | Ht 63.0 in | Wt 126.2 lb

## 2022-10-27 DIAGNOSIS — I10 Essential (primary) hypertension: Secondary | ICD-10-CM

## 2022-10-27 DIAGNOSIS — R300 Dysuria: Secondary | ICD-10-CM | POA: Diagnosis not present

## 2022-10-27 DIAGNOSIS — E039 Hypothyroidism, unspecified: Secondary | ICD-10-CM

## 2022-10-27 LAB — POC URINALSYSI DIPSTICK (AUTOMATED)
Bilirubin, UA: NEGATIVE
Blood, UA: NEGATIVE
Glucose, UA: NEGATIVE
Ketones, UA: NEGATIVE
Leukocytes, UA: NEGATIVE
Nitrite, UA: NEGATIVE
Protein, UA: NEGATIVE
Spec Grav, UA: 1.02 (ref 1.010–1.025)
Urobilinogen, UA: 0.2 E.U./dL
pH, UA: 5.5 (ref 5.0–8.0)

## 2022-10-27 MED ORDER — NITROFURANTOIN MONOHYD MACRO 100 MG PO CAPS: 100.0000 mg | ORAL_CAPSULE | Freq: Two times a day (BID) | ORAL | 0 refills | Status: AC

## 2022-10-27 NOTE — Progress Notes (Signed)
   Subjective:    Patient ID: Kaitlyn Parks, female    DOB: April 18, 1935, 88 y.o.   MRN: [id number]  Dysuria  This is a recurrent problem. The current episode started in the past 7 days. The problem occurs every urination. The problem has been gradually improving. The quality of the pain is described as burning. There has been no fever. Associated symptoms include frequency, nausea and urgency. She has tried antibiotics and home medications (took three doses of Macrobid that she had left over from a previous prescription.) for the symptoms. The treatment provided mild relief. Her past medical history is significant for recurrent UTIs.      Review of Systems  Gastrointestinal:  Positive for nausea.  Genitourinary:  Positive for dysuria, frequency and urgency.       Objective:   Physical Exam Vitals and nursing note reviewed.  Constitutional:      Appearance: Normal appearance.  Abdominal:     General: Abdomen is flat. Bowel sounds are normal.     Palpations: Abdomen is soft.     Tenderness: There is no right CVA tenderness or left CVA tenderness.  Musculoskeletal:        General: Normal range of motion.  Skin:    General: Skin is warm and dry.  Neurological:     General: No focal deficit present.     Mental Status: She is alert and oriented to person, place, and time.  Psychiatric:        Mood and Affect: Mood normal.        Behavior: Behavior normal.        Thought Content: Thought content normal.        Judgment: Judgment normal.       Assessment & Plan:  1. Dysuria  - POCT Urinalysis Dipstick (Automated)- negative but likely hidden due to home use of macrobid use. She did start feeling better after starting Macrobid. Will place her back on macrobid. Take all medications  - Follow up with PCP if needed - nitrofurantoin, macrocrystal-monohydrate, (MACROBID) 100 MG capsule; Take 1 capsule (100 mg total) by mouth 2 (two) times daily for 5 days.  Dispense: 10 capsule; Refill:  0

## 2022-11-13 NOTE — Progress Notes (Signed)
[facility] - Clinic Note  11/23/2022    CHIEF COMPLAINT Patient presents for Retina Follow Up  HISTORY OF PRESENT ILLNESS: Kaitlyn Parks is a 88 y.o. female who presents to the clinic today for:   HPI     Retina Follow Up   Patient presents with  Wet AMD.  In left eye.  Severity is moderate.  Duration of 6 weeks.  Since onset it is stable.  I, the attending physician,  performed the HPI with the patient and updated documentation appropriately.        Comments   Pt here for 6 wk ret f/u exu ARMD OS. Pt states [facility] is good, went to see Dr. Bascom Levels for a follow up. Received a good report and per Dr. Bascom Levels, [facility] is 'looking better'. Pt noted during acuity that the letters had a grey square over them in OS.       Last edited by Rennis Chris, MD on 11/24/2022 10:01 PM.      Referring physician: Karie Georges, MD [address] [place],  [place] [address]  HISTORICAL INFORMATION:  Selected notes from the MEDICAL RECORD NUMBER Referred by Dr. [place] DeMarco for concern of exu ARMD LEE: 11.26.19 (J. 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   No current facility-administered medications for this visit. (Ophthalmic Drugs)   Current Outpatient Medications (Other)  Medication Sig   alum & mag hydroxide-simeth (MAALOX PLUS) 400-400-40 MG/5ML suspension Take 15 mLs by mouth every 6 (six) hours as needed for indigestion.   amLODipine (NORVASC) 2.5 MG tablet Take 1 tablet (2.5 mg total) by mouth daily.   aspirin EC 81 MG tablet Take 81 mg by mouth daily.   Biotin w/ Vitamins C & E (HAIR/SKIN/NAILS PO) Take 1 tablet by mouth daily.    Cranberry 400 MG CAPS Take 400 mg by mouth daily.   hydrALAZINE (APRESOLINE) 10 MG tablet TAKE (1) TABLET TWICE A DAY. MAY TAKE EXTRA DOSE FOR SBP >160 UP TO A TOTAL OF 4 TIMES A DAY. (Patient taking differently: TAKE (1) TABLET TWICE A DAY. MAY TAKE EXTRA DOSE FOR SBP >160 UP TO A TOTAL OF 4 TIMES A DAY. As Needed)   ibuprofen (ADVIL) 400 MG tablet Take 400 mg by mouth every 6 (six) hours as needed for mild pain.   isosorbide mononitrate (IMDUR) 60 MG 24 hr tablet TAKE 1 & 1/2 TABLETS A DAY.   Multiple Vitamins-Calcium (ONE-A-DAY WOMENS PO) Take 1 tablet by mouth daily.    nitroGLYCERIN (NITROSTAT) 0.4 MG SL tablet DISSOLVE 1 TABLET UNDER TONGUE IF NEEDED FOR CHEST PAIN. MAY REPEAT IN 5 MINUTES FOR 3 DOSES.   ondansetron (ZOFRAN-ODT) 4 MG disintegrating tablet Take 1 tablet (4 mg total) by mouth every 8 (eight) hours as needed for nausea or vomiting.   PRESCRIPTION MEDICATION Avastin eye injections given by Dr Vanessa Barbara due to macular degeneration   rosuvastatin (CRESTOR) 10 MG tablet TABLET ONE-HALF TABLET BY MOUTH DAILY   SYNTHROID 50 MCG tablet TAKE 1 TABLET EACH DAY.   meloxicam (MOBIC) 7.5 MG tablet Take 1 tablet every day by oral route with meal(s) for 30 days.   pantoprazole (PROTONIX) 40 MG tablet Take 1 tablet (40  mg total) by mouth daily.   No current facility-administered medications for this visit. (Other)   Facility-Administered Medications Ordered in Other Visits (Other)  Medication Route   technetium tetrofosmin (TC-MYOVIEW) injection 32.9 millicurie Intravenous   REVIEW OF SYSTEMS: ROS   Positive for: Gastrointestinal, Genitourinary, Musculoskeletal, Cardiovascular, Eyes Negative for: Constitutional, Neurological, Skin, HENT, Endocrine, Respiratory, Psychiatric, Allergic/Imm, Heme/Lymph Last edited by Thompson Grayer, COT on 11/23/2022  1:11 PM.      ALLERGIES Allergies  Allergen Reactions   Acyclovir And Related Other (See Comments)    unknown   Pravachol  [Pravastatin Sodium] Other (See Comments)    cystitis   Sulfa Antibiotics     nausea   Zocor [Simvastatin] Other (See Comments)    cystitis   PAST MEDICAL HISTORY Past Medical History:  Diagnosis Date   Arthritis    DDD, scoliosis, sees Dr. Lovell Sheehan for this, uses norco very rarely for pain   Bradycardia 11/11/2017   CAD (coronary artery disease)    LAD stenting of a 90% lesion 2012   Cystocele    Educated about COVID-19 virus infection 12/06/2019   Elevated cholesterol    GERD (gastroesophageal reflux disease)    dx in work up 2016 for atypical CP at OSH   pt. denies   Heart murmur    Hypertensive retinopathy    OU   Hypothyroidism    Interstitial cystitis    sees Dr. [name]   Macular degeneration    OU   NSTEMI (non-ST elevated myocardial infarction)    Osteoporosis    Rectocele    S/P hip replacement, right 06/12/2017   Scoliosis    Thyroid disease    Hypothyroid   Urinary incontinence    USI   Uterine prolapse    Past Surgical History:  Procedure Laterality Date   BLADDER SUSPENSION  2011   CATARACT EXTRACTION Bilateral 2015   Dr. Elmer Picker   CORONARY ANGIOPLASTY WITH STENT PLACEMENT  04/21/2010   LAD 80%, RCA 30%, nl EF, s/p DES LAD   EYE SURGERY     LEFT HEART CATH AND CORONARY ANGIOGRAPHY N/A 06/14/2017   Procedure: LEFT HEART CATH AND CORONARY ANGIOGRAPHY;  Surgeon: Runell Gess, MD;  Location: [facility];  Service: Cardiovascular;  Laterality: N/A;   OOPHORECTOMY  2011   BSO   TOTAL HIP ARTHROPLASTY Right 06/10/2017   Procedure: RIGHT TOTAL HIP ARTHROPLASTY ANTERIOR APPROACH;  Surgeon: Samson Frederic, MD;  Location: [facility];  Service: Orthopedics;  Laterality: Right;  Needs RNFA   VAGINAL HYSTERECTOMY  2011   LAVH BSO; benign   FAMILY HISTORY Family History  Problem Relation Age of Onset   Hypertension Mother    Heart disease Mother    Heart disease Father    Heart disease Sister    Diabetes Sister    Uterine cancer Sister        mets to  lungs   Lung cancer Sister    Scoliosis Sister    Heart disease Brother    Colon cancer Paternal Aunt 58   Breast cancer Paternal Aunt        Age 8's   Breast cancer Paternal Aunt    Leukemia Paternal Aunt    Lung cancer Paternal Grandfather        smoker   Arthritis Daughter    Breast cancer Cousin        Maternal 1st cousins-Age 5's   Esophageal cancer Neg Hx    Pancreatic cancer Neg Hx  Stomach cancer Neg Hx    SOCIAL HISTORY Social History   Tobacco Use   Smoking status: Never   Smokeless tobacco: Never  Vaping Use   Vaping Use: Never used  Substance Use Topics   Alcohol use: No    Comment: Rare   Drug use: No       OPHTHALMIC EXAM: Base Eye Exam     Visual Acuity (Snellen - Linear)       Right Left   Dist [place] 20/40 -1 20/80 -2   Dist ph [place] NI 20/60 +1  Noted that during acuity, letters on screen had grey square over them in OS. MS        Tonometry (Tonopen, 1:18 PM)       Right Left   Pressure 15 11         Pupils       Pupils Dark Light Shape React APD   Right PERRL 3 2 Round Brisk None   Left PERRL 3 2 Round Brisk None         Visual Fields (Counting fingers)       Left Right    Full Full         Extraocular Movement       Right Left    Full, Ortho Full, Ortho         Neuro/Psych     Oriented x3: Yes   Mood/Affect: Normal         Dilation     Both eyes: 1.0% Mydriacyl, 2.5% Phenylephrine @ 1:18 PM           Slit Lamp and Fundus Exam     Slit Lamp Exam       Right Left   Lids/Lashes mild Telangiectasia -- improved, marginal lesion nasal UL Telangiectasia, Meibomian gland dysfunction, lower lid edema -- all improved   Conjunctiva/Sclera White and quiet White and quiet, inferior conj chalasis   Cornea Arcus, 1+ Punctate epithelial erosions, fine endo pigment 1+ punctate epithelial erosions, Arcus, trace, fine endo pigment   Anterior Chamber Deep and clear Deep and clear   Iris Round and dilated Round and  well dilated   Lens PC IOL in good position, trace Posterior capsular opacification (linear, extending just to visual axis) PC IOL in excellent position   Anterior Vitreous Vitreous syneresis, Posterior vitreous detachment Vitreous syneresis, Posterior vitreous detachment, vitreous condensations         Fundus Exam       Right Left   Disc Pink and Sharp, Compact, focal PPP Pink and Sharp, mild temporal PPA/PPP   C/D Ratio 0.3 0.3   Macula Blunted foveal reflex, Drusen, RPE mottling and clumping, early Atrophy, +PEDs -- stably improved, trace cystic changes -- stably improved, No frank heme Blunted foveal reflex, +drusen, pigment clumping, RPE mottling, clumping and atrophy, no heme, central PED / CNV with overlying cystic changes -- slightly improved, +GA   Vessels attenuated, Tortuous attenuated, Tortuous   Periphery Attached, scattered reticular degeneration   Attached, scattered reticular degeneration           IMAGING AND PROCEDURES  Imaging and Procedures for @TODAY @  OCT, Retina - OU - Both Eyes       Right Eye Quality was good. Central Foveal Thickness: 232. Progression has been stable. Findings include no IRF, no SRF, abnormal foveal contour, retinal drusen , intraretinal hyper-reflective material, pigment epithelial detachment, outer retinal atrophy (stable improvement in cystic changes nasal and temporal fovea, patchy ORA /  GA).   Left Eye Quality was good. Central Foveal Thickness: 247. Progression has improved. Findings include normal foveal contour, no IRF, no SRF, retinal drusen , intraretinal hyper-reflective material, pigment epithelial detachment, outer retinal atrophy (Interval improvement in IRF/cystic changes inferior fovea, persistent SRHM / PED inferior to fovea).   Notes *Images captured and stored on drive  Diagnosis / Impression:  OD: exudative ARMD -- stable improvement in cystic changes nasal and temporal fovea, patchy ORA / GA OS: exu-ARMD -- Interval  improvement in IRF/cystic changes inferior fovea, persistent SRHM / PED inferior to fovea  Clinical management:  See below  Abbreviations: NFP - Normal foveal profile. CME - cystoid macular edema. PED - pigment epithelial detachment. IRF - intraretinal fluid. SRF - subretinal fluid. EZ - ellipsoid zone. ERM - epiretinal membrane. ORA - outer retinal atrophy. ORT - outer retinal tubulation. SRHM - subretinal hyper-reflective material      Intravitreal Injection, Pharmacologic Agent - OS - Left Eye       Time Out 11/23/2022. 2:14 PM. Confirmed correct patient, procedure, site, and patient consented.   Anesthesia Topical anesthesia was used. Anesthetic medications included Lidocaine 2%, Proparacaine 0.5%.   Procedure Preparation included 5% betadine to ocular surface, eyelid speculum. A (32g) needle was used.   Injection: 2 mg aflibercept 2 MG/0.05ML   Route: Intravitreal, Site: Left Eye   NDC: L6038910, Lot: [id number], Expiration date: 12/01/2023, Waste: 0 mL   Post-op Post injection exam found visual acuity of at least counting fingers. The patient tolerated the procedure well. There were no complications. The patient received written and verbal post procedure care education. Post injection medications were not given.            ASSESSMENT/PLAN:    ICD-10-CM   1. Exudative age-related macular degeneration of left eye with active choroidal neovascularization  H35.3221 OCT, Retina - OU - Both Eyes    Intravitreal Injection, Pharmacologic Agent - OS - Left Eye    aflibercept (EYLEA) SOLN 2 mg    2. Exudative age-related macular degeneration of right eye with active choroidal neovascularization  H35.3211 CANCELED: Intravitreal Injection, Pharmacologic Agent - OD - Right Eye    3. Essential hypertension  I10     4. Hypertensive retinopathy of both eyes  H35.033     5. Pseudophakia of both eyes  Z96.1       1. Exudative age-related macular degeneration, left eye   -  OCT 4.30.2020 showed interval conversion of OS from nonexudative ARMD to exu-ARMD with large dome-shaped PED with overlying IRF/SRF  - FA 05.28.20 -- no active CNV OS, just staining  - s/p IVA OS #1 (04.30.20), #2 (05.28.20), #3 (06.26.20), #4 (08.03.20), #5 (09.11.20), #6 (10.19.20), #7 (11.23.20),  - reactivation of CNV noted on 05.05.22 -- s/p IVA OS  #8 (05.05.22), #9 (06.06.22), #10 (07.19.22), #11 (09.13.22), #12 (10.25.22), #13 (12.13.22), #14 (01.30.23), #15 (3.20.23), #16 (05.08.23), #17 (06.19.23), #18 (08.07.23), #19 (10.02.23), #20 (11.20.23), #21 (01.15.24)  - s/p IVE OS #1 (03.11.24)  - **Interval increase in IRF overlying PED at 8 wks on 09.13.22 visit**  - BCVA OS 20/60 from 20/70 -- improved   - OCT shows OS: Interval improvement of IRF/cystic changes inferior fovea, persistent SRHM / PED inferior to fovea at 6 weeks  - recommend IVE OS #2 today, 04.22.24 with f/u at 6 weeks   - pt wishes to proceed with injection  - RBA of procedure discussed, questions answered  - IVA informed consent obtained and re-signed, 05.08.23 (  OU)  - IVE informed consent obtained and signed, 03.11.24  - see procedure note   - benefits investigation for Eylea initiated 4.30.2020 -- approved for 2024  - f/u in 6 wks -- DFE/OCT, possible injection  2. Age related macular degeneration, exudative, OD  - interval development of IRF first noted on 09.11.20 -- conversion from nonexudative to exudative ARMD  - pt initially presented on 11.27.19 due to alert from [facility] home monitoring system for OD  - [facility] prescribed by Dr. Elmer Picker  - FA (5.28.2020) showed staining / window defect corresponding temporal RPE changes OU -- no active CNV OU  - S/P IVA OD #1 (09.11.20), #2 (10.19.20), #3 (11.23.20), #4 (01.07.21), #5 (2.19.21), #6 (04.02.21), #7 (05.28.21), #8 (07.23.21), #9 (09.24.21), #10 (11.29.21), #11 (02.11.22), #12 (05.05.22), #13 (7.19.22), #14 (09.13.22), #15 (10.02.23), #16 (11.20.23), #17 (01.15.24),  #18 (03.11.24)  - OCT shows stable improvement in cystic changes nasal and temporal fovea, patchy ORA / GA at 6 weeks  - BCVA OD stable at 20/40 -- stable   - recommend holding IVA OD today -- will treat PRN - IVA informed consent obtained and signed, 05.08.23 (OU) - see procedure note  - f/u 6 weeks DFE, OCT  3,4. Hypertensive retinopathy OU  - discussed importance of tight BP control  - monitor  5. Pseudophakia OU  - s/p CE/IOL OU  - beautiful surgeries, doing well  - monitor  6. Dry eyes OU  - recommend artificial tears and lubricating ointment as needed   Ophthalmic Meds Ordered this visit:  Meds ordered this encounter  Medications   aflibercept (EYLEA) SOLN 2 mg     Return in about 6 weeks (around 01/04/2023) for f/u exu ARMD OU, DFE, OCT.  There are no Patient Instructions on file for this visit.  This document serves as a record of services personally performed by Karie Chimera, MD, PhD. It was created on their behalf by De Blanch, an ophthalmic technician. The creation of this record is the provider's dictation and/or activities during the visit.    Electronically signed by: De Blanch, OA, 11/24/22  10:02 PM  This document serves as a record of services personally performed by Karie Chimera, MD, PhD. It was created on their behalf by Glee Arvin. Manson Passey, OA an ophthalmic technician. The creation of this record is the provider's dictation and/or activities during the visit.    Electronically signed by: Glee Arvin. Manson Passey, [place] 04.22.2024 10:02 PM  Karie Chimera, M.D., Ph.D. Diseases & Surgery of the Retina and Vitreous [facility]  I have reviewed the above documentation for accuracy and completeness, and I agree with the above. Karie Chimera, M.D., Ph.D. 11/24/22 10:04 PM   Abbreviations: M myopia (nearsighted); A astigmatism; H hyperopia (farsighted); P presbyopia; Mrx spectacle prescription;  CTL contact lenses; OD right eye;  OS left eye; OU both eyes  XT exotropia; ET esotropia; PEK punctate epithelial keratitis; PEE punctate epithelial erosions; DES dry eye syndrome; MGD meibomian gland dysfunction; ATs artificial tears; PFAT's preservative free artificial tears; NSC nuclear sclerotic cataract; PSC posterior subcapsular cataract; ERM epi-retinal membrane; PVD posterior vitreous detachment; RD retinal detachment; DM diabetes mellitus; DR diabetic retinopathy; NPDR non-proliferative diabetic retinopathy; PDR proliferative diabetic retinopathy; CSME clinically significant macular edema; DME diabetic macular edema; dbh dot blot hemorrhages; CWS cotton wool spot; POAG primary open angle glaucoma; C/D cup-to-disc ratio; HVF humphrey visual field; GVF goldmann visual field; OCT optical coherence tomography; IOP intraocular pressure; BRVO Branch  retinal vein occlusion; CRVO central retinal vein occlusion; CRAO central retinal artery occlusion; BRAO branch retinal artery occlusion; RT retinal tear; SB scleral buckle; PPV pars plana vitrectomy; VH Vitreous hemorrhage; PRP panretinal laser photocoagulation; IVK intravitreal kenalog; VMT vitreomacular traction; MH Macular hole;  NVD neovascularization of the disc; NVE neovascularization elsewhere; AREDS age related eye disease study; ARMD age related macular degeneration; POAG primary open angle glaucoma; EBMD epithelial/anterior basement membrane dystrophy; ACIOL anterior chamber intraocular lens; IOL intraocular lens; PCIOL posterior chamber intraocular lens; Phaco/IOL phacoemulsification with intraocular lens placement; [place] photorefractive keratectomy; LASIK laser assisted in situ keratomileusis; HTN hypertension; DM diabetes mellitus; COPD chronic obstructive pulmonary disease

## 2022-11-23 ENCOUNTER — Encounter (INDEPENDENT_AMBULATORY_CARE_PROVIDER_SITE_OTHER): Payer: Self-pay | Admitting: Ophthalmology

## 2022-11-23 ENCOUNTER — Ambulatory Visit (INDEPENDENT_AMBULATORY_CARE_PROVIDER_SITE_OTHER): Payer: Medicare Other | Admitting: Ophthalmology

## 2022-11-23 DIAGNOSIS — I10 Essential (primary) hypertension: Secondary | ICD-10-CM

## 2022-11-23 DIAGNOSIS — Z961 Presence of intraocular lens: Secondary | ICD-10-CM | POA: Diagnosis not present

## 2022-11-23 DIAGNOSIS — H353231 Exudative age-related macular degeneration, bilateral, with active choroidal neovascularization: Secondary | ICD-10-CM

## 2022-11-23 DIAGNOSIS — H353221 Exudative age-related macular degeneration, left eye, with active choroidal neovascularization: Secondary | ICD-10-CM

## 2022-11-23 DIAGNOSIS — H353211 Exudative age-related macular degeneration, right eye, with active choroidal neovascularization: Secondary | ICD-10-CM

## 2022-11-23 DIAGNOSIS — H04123 Dry eye syndrome of bilateral lacrimal glands: Secondary | ICD-10-CM

## 2022-11-23 DIAGNOSIS — H35033 Hypertensive retinopathy, bilateral: Secondary | ICD-10-CM | POA: Diagnosis not present

## 2022-11-23 MED ORDER — AFLIBERCEPT 2MG/0.05ML IZ SOLN FOR KALEIDOSCOPE
2.0000 mg | INTRAVITREAL | Status: AC | PRN
  Administered 2022-11-23: 2 mg via INTRAVITREAL

## 2022-11-24 ENCOUNTER — Encounter (INDEPENDENT_AMBULATORY_CARE_PROVIDER_SITE_OTHER): Payer: Self-pay | Admitting: Ophthalmology

## 2022-12-24 NOTE — Progress Notes (Signed)
[facility] - Clinic Note  01/04/2023    CHIEF COMPLAINT Patient presents for Retina Follow Up  HISTORY OF PRESENT ILLNESS: Kaitlyn Parks is a 88 y.o. female who presents to the clinic today for:   HPI     Retina Follow Up   Patient presents with  Wet AMD.  In both eyes.  This started 6 weeks ago.  Duration of 6 weeks.  Since onset it is stable.  I, the attending physician,  performed the HPI with the patient and updated documentation appropriately.        Comments   6 week retina follow up ARMD OU and I'VE OS and IVA OD pt is reporting no vision changes noticed she denies any flashes or floaters       Last edited by Rennis Chris, MD on 01/04/2023  1:20 PM.    Pt states she couldn't see the eye chart with her left eye today, she states she can see a little with just her right eye and with both eyes open   Referring physician: Karie Georges, MD [address] [place],  [place] [address]  HISTORICAL INFORMATION:  Selected notes from the MEDICAL RECORD NUMBER Referred by Dr. [place] DeMarco for concern of exu ARMD LEE: 11.26.19 (J. 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   No current facility-administered medications for this visit. (Ophthalmic Drugs)   Current Outpatient Medications (Other)  Medication Sig   alum & mag hydroxide-simeth (MAALOX PLUS) 400-400-40 MG/5ML suspension Take 15 mLs by mouth every 6 (six) hours as needed for indigestion.   amLODipine (NORVASC) 2.5 MG tablet Take 1 tablet (2.5 mg total) by mouth daily.   aspirin EC 81 MG tablet Take 81 mg by mouth daily.   Biotin w/ Vitamins C & E (HAIR/SKIN/NAILS PO)  Take 1 tablet by mouth daily.   Cranberry 400 MG CAPS Take 400 mg by mouth daily.   hydrALAZINE (APRESOLINE) 10 MG tablet TAKE (1) TABLET TWICE A DAY. MAY TAKE EXTRA DOSE FOR SBP >160 UP TO A TOTAL OF 4 TIMES A DAY. (Patient taking differently: TAKE (1) TABLET TWICE A DAY. MAY TAKE EXTRA DOSE FOR SBP >160 UP TO A TOTAL OF 4 TIMES A DAY. As Needed)   ibuprofen (ADVIL) 400 MG tablet Take 400 mg by mouth every 6 (six) hours as needed for mild pain.   isosorbide mononitrate (IMDUR) 60 MG 24 hr tablet TAKE 1 & 1/2 TABLETS A DAY.   meloxicam (MOBIC) 7.5 MG tablet Take 1 tablet every day by oral route with meal(s) for 30 days.   Multiple Vitamins-Calcium (ONE-A-DAY WOMENS PO) Take 1 tablet by mouth daily.    nitroGLYCERIN (NITROSTAT) 0.4 MG SL tablet DISSOLVE 1 TABLET UNDER TONGUE IF NEEDED FOR CHEST PAIN. MAY REPEAT IN 5 MINUTES FOR 3 DOSES.   ondansetron (ZOFRAN-ODT) 4 MG disintegrating tablet Take 1 tablet (4 mg total) by mouth every 8 (eight) hours as needed for nausea or vomiting.   pantoprazole (PROTONIX) 40 MG tablet Take 1 tablet (40 mg total) by mouth daily.   PRESCRIPTION MEDICATION Avastin eye injections given by Dr Vanessa Barbara due to macular degeneration   rosuvastatin (CRESTOR) 10 MG tablet TABLET ONE-HALF TABLET BY  MOUTH DAILY   SYNTHROID 50 MCG tablet TAKE 1 TABLET EACH DAY.   No current facility-administered medications for this visit. (Other)   Facility-Administered Medications Ordered in Other Visits (Other)  Medication Route   technetium tetrofosmin (TC-MYOVIEW) injection 32.9 millicurie Intravenous   REVIEW OF SYSTEMS: ROS   Positive for: Gastrointestinal, Genitourinary, Musculoskeletal, Cardiovascular, Eyes Negative for: Constitutional, Neurological, Skin, HENT, Endocrine, Respiratory, Psychiatric, Allergic/Imm, Heme/Lymph Last edited by Etheleen Mayhew, COT on 01/04/2023  1:02 PM.       ALLERGIES Allergies  Allergen Reactions   Acyclovir And Related Other (See Comments)     unknown   Pravachol [Pravastatin Sodium] Other (See Comments)    cystitis   Sulfa Antibiotics     nausea   Zocor [Simvastatin] Other (See Comments)    cystitis   PAST MEDICAL HISTORY Past Medical History:  Diagnosis Date   Arthritis    DDD, scoliosis, sees Dr. Lovell Sheehan for this, uses norco very rarely for pain   Bradycardia 11/11/2017   CAD (coronary artery disease)    LAD stenting of a 90% lesion 2012   Cystocele    Educated about COVID-19 virus infection 12/06/2019   Elevated cholesterol    GERD (gastroesophageal reflux disease)    dx in work up 2016 for atypical CP at OSH   pt. denies   Heart murmur    Hypertensive retinopathy    OU   Hypothyroidism    Interstitial cystitis    sees Dr. [name]   Macular degeneration    OU   NSTEMI (non-ST elevated myocardial infarction) [facility])    Osteoporosis    Rectocele    S/P hip replacement, right 06/12/2017   Scoliosis    Thyroid disease    Hypothyroid   Urinary incontinence    USI   Uterine prolapse    Past Surgical History:  Procedure Laterality Date   BLADDER SUSPENSION  2011   CATARACT EXTRACTION Bilateral 2015   Dr. Elmer Picker   CORONARY ANGIOPLASTY WITH STENT PLACEMENT  04/21/2010   LAD 80%, RCA 30%, nl EF, s/p DES LAD   EYE SURGERY     LEFT HEART CATH AND CORONARY ANGIOGRAPHY N/A 06/14/2017   Procedure: LEFT HEART CATH AND CORONARY ANGIOGRAPHY;  Surgeon: Runell Gess, MD;  Location: [facility];  Service: Cardiovascular;  Laterality: N/A;   OOPHORECTOMY  2011   BSO   TOTAL HIP ARTHROPLASTY Right 06/10/2017   Procedure: RIGHT TOTAL HIP ARTHROPLASTY ANTERIOR APPROACH;  Surgeon: Samson Frederic, MD;  Location: [facility];  Service: Orthopedics;  Laterality: Right;  Needs RNFA   VAGINAL HYSTERECTOMY  2011   [facility] BSO; benign   FAMILY HISTORY Family History  Problem Relation Age of Onset   Hypertension Mother    Heart disease Mother    Heart disease Father    Heart disease Sister    Diabetes Sister    Uterine  cancer Sister        mets to lungs   Lung cancer Sister    Scoliosis Sister    Heart disease Brother    Colon cancer Paternal Aunt 2   Breast cancer Paternal Aunt        Age 75's   Breast cancer Paternal Aunt    Leukemia Paternal Aunt    Lung cancer Paternal Grandfather        smoker   Arthritis Daughter    Breast cancer Cousin        Maternal 1st cousins-Age 36's   Esophageal cancer  Neg Hx    Pancreatic cancer Neg Hx    Stomach cancer Neg Hx    SOCIAL HISTORY Social History   Tobacco Use   Smoking status: Never   Smokeless tobacco: Never  Vaping Use   Vaping Use: Never used  Substance Use Topics   Alcohol use: No    Comment: Rare   Drug use: No       OPHTHALMIC EXAM: Base Eye Exam     Visual Acuity (Snellen - Linear)       Right Left   Dist [place] 20/40 -1 20/80 -2   Dist ph   20/60 -2         Tonometry (Tonopen, 1:07 PM)       Right Left   Pressure 14 16         Pupils       Pupils Dark Light Shape React APD   Right PERRL 3 2 Round Brisk None   Left PERRL 3 2 Round Brisk None         Visual Fields       Left Right    Full Full         Extraocular Movement       Right Left    Full, Ortho Full, Ortho         Neuro/Psych     Oriented x3: Yes   Mood/Affect: Normal         Dilation     Both eyes: 2.5% Phenylephrine @ 1:07 PM           Slit Lamp and Fundus Exam     Slit Lamp Exam       Right Left   Lids/Lashes mild Telangiectasia -- improved, marginal lesion nasal UL Telangiectasia, Meibomian gland dysfunction, lower lid edema -- all improved   Conjunctiva/Sclera White and quiet White and quiet, inferior conj chalasis   Cornea Arcus, 1+ Punctate epithelial erosions, fine endo pigment 1+ punctate epithelial erosions, Arcus, trace, fine endo pigment   Anterior Chamber Deep and clear Deep and clear   Iris Round and dilated Round and well dilated   Lens PC IOL in good position, trace Posterior capsular opacification  (linear, extending just to visual axis) PC IOL in excellent position   Anterior Vitreous Vitreous syneresis, Posterior vitreous detachment Vitreous syneresis, Posterior vitreous detachment, vitreous condensations         Fundus Exam       Right Left   Disc Pink and Sharp, Compact, focal PPP Pink and Sharp, mild temporal PPA/PPP   C/D Ratio 0.3 0.3   Macula Blunted foveal reflex, Drusen, RPE mottling and clumping, early Atrophy, +PEDs -- stably improved, trace cystic changes -- stably improved, No frank heme Blunted foveal reflex, +drusen, pigment clumping, RPE mottling, clumping and atrophy, no heme, central PED / CNV with overlying cystic changes -- stably improved, +GA   Vessels attenuated, Tortuous attenuated, Tortuous   Periphery Attached, scattered reticular degeneration   Attached, scattered reticular degeneration           IMAGING AND PROCEDURES  Imaging and Procedures for @TODAY @  OCT, Retina - OU - Both Eyes       Right Eye Quality was good. Central Foveal Thickness: 242. Progression has been stable. Findings include no IRF, no SRF, abnormal foveal contour, retinal drusen , intraretinal hyper-reflective material, pigment epithelial detachment, outer retinal atrophy (stable improvement in cystic changes nasal and temporal fovea, patchy ORA / GA).   Left Eye Quality was  good. Central Foveal Thickness: 257. Progression has improved. Findings include normal foveal contour, no IRF, no SRF, retinal drusen , intraretinal hyper-reflective material, pigment epithelial detachment, outer retinal atrophy (Stable improvement in IRF/cystic changes inferior fovea, persistent SRHM / PED inferior to fovea -- slightly improved).   Notes *Images captured and stored on drive  Diagnosis / Impression:  OD: exudative ARMD -- stable improvement in cystic changes nasal and temporal fovea, patchy ORA / GA OS: exu-ARMD -- Stable improvement in IRF/cystic changes inferior fovea, persistent SRHM /  PED inferior to fovea -- slightly improved  Clinical management:  See below  Abbreviations: NFP - Normal foveal profile. CME - cystoid macular edema. PED - pigment epithelial detachment. IRF - intraretinal fluid. SRF - subretinal fluid. EZ - ellipsoid zone. ERM - epiretinal membrane. ORA - outer retinal atrophy. ORT - outer retinal tubulation. SRHM - subretinal hyper-reflective material      Intravitreal Injection, Pharmacologic Agent - OS - Left Eye       Time Out 01/04/2023. 1:37 PM. Confirmed correct patient, procedure, site, and patient consented.   Anesthesia Topical anesthesia was used. Anesthetic medications included Lidocaine 2%, Proparacaine 0.5%.   Procedure Preparation included 5% betadine to ocular surface, eyelid speculum. A (32g) needle was used.   Injection: 2 mg aflibercept 2 MG/0.05ML   Route: Intravitreal, Site: Left Eye   NDC: L6038910, Lot: [id number], Expiration date: 12/01/2023, Waste: 0 mL   Post-op Post injection exam found visual acuity of at least counting fingers. The patient tolerated the procedure well. There were no complications. The patient received written and verbal post procedure care education. Post injection medications were not given.             ASSESSMENT/PLAN:    ICD-10-CM   1. Exudative age-related macular degeneration of left eye with active choroidal neovascularization (HCC)  H35.3221 OCT, Retina - OU - Both Eyes    Intravitreal Injection, Pharmacologic Agent - OS - Left Eye    aflibercept (EYLEA) SOLN 2 mg    2. Exudative age-related macular degeneration of right eye with active choroidal neovascularization (HCC)  H35.3211     3. Essential hypertension  I10     4. Hypertensive retinopathy of both eyes  H35.033     5. Pseudophakia of both eyes  Z96.1     6. Dry eyes  H04.123      1. Exudative age-related macular degeneration, left eye   - OCT 4.30.2020 showed interval conversion of OS from nonexudative ARMD to  exu-ARMD with large dome-shaped PED with overlying IRF/SRF  - FA 05.28.20 -- no active CNV OS, just staining  - s/p IVA OS #1 (04.30.20), #2 (05.28.20), #3 (06.26.20), #4 (08.03.20), #5 (09.11.20), #6 (10.19.20), #7 (11.23.20),  - reactivation of CNV noted on 05.05.22 -- s/p IVA OS  #8 (05.05.22), #9 (06.06.22), #10 (07.19.22), #11 (09.13.22), #12 (10.25.22), #13 (12.13.22), #14 (01.30.23), #15 (3.20.23), #16 (05.08.23), #17 (06.19.23), #18 (08.07.23), #19 (10.02.23), #20 (11.20.23), #21 (01.15.24)  - s/p IVE OS #1 (03.11.24), #2 (04.22.24)  - **Interval increase in IRF overlying PED at 8 wks on 09.13.22 visit**  - BCVA OS stable at 20/60   - OCT shows OS: Stable improvement in IRF/cystic changes inferior fovea, persistent SRHM / PED inferior to fovea -- slightly improved at 6 wks  - recommend IVE OS #3 today, 05.29.24 with f/u ext to 7 weeks   - pt wishes to proceed with injection  - RBA of procedure discussed, questions answered  - IVA informed  consent obtained and re-signed, 05.08.23 (OU)  - IVE informed consent obtained and signed, 03.11.24  - see procedure note   - benefits investigation for Eylea initiated 4.30.2020 -- approved for 2024  - f/u in 7 wks -- DFE/OCT, possible injection  2. Age related macular degeneration, exudative, OD  - interval development of IRF first noted on 09.11.20 -- conversion from nonexudative to exudative ARMD  - pt initially presented on 11.27.19 due to alert from [facility] home monitoring system for OD  - [facility] prescribed by Dr. Elmer Picker  - FA (5.28.2020) showed staining / window defect corresponding temporal RPE changes OU -- no active CNV OU  - S/P IVA OD #1 (09.11.20), #2 (10.19.20), #3 (11.23.20), #4 (01.07.21), #5 (2.19.21), #6 (04.02.21), #7 (05.28.21), #8 (07.23.21), #9 (09.24.21), #10 (11.29.21), #11 (02.11.22), #12 (05.05.22), #13 (7.19.22), #14 (09.13.22), #15 (10.02.23), #16 (11.20.23), #17 (01.15.24), #18 (03.11.24)  - OCT shows stable improvement  in cystic changes nasal and temporal fovea, patchy ORA / GA at 12 wks since last injxn  - BCVA OD stable at 20/40 -- stable   - recommend holding IVA OD today -- will treat PRN - IVA informed consent obtained and signed, 05.08.23 (OU) - see procedure note  - f/u 7 weeks DFE, OCT  3,4. Hypertensive retinopathy OU  - discussed importance of tight BP control  - monitor  5. Pseudophakia OU  - s/p CE/IOL OU  - beautiful surgeries, doing well  - monitor  6. Dry eyes OU  - recommend artificial tears and lubricating ointment as needed   Ophthalmic Meds Ordered this visit:  Meds ordered this encounter  Medications   aflibercept (EYLEA) SOLN 2 mg     Return in about 6 weeks (around 02/15/2023) for f/u exu ARMD OS, DFE, OCT.  There are no Patient Instructions on file for this visit.  This document serves as a record of services personally performed by Karie Chimera, MD, PhD. It was created on their behalf by De Blanch, an ophthalmic technician. The creation of this record is the provider's dictation and/or activities during the visit.    Electronically signed by: De Blanch, OA, 01/04/23  4:39 PM  This document serves as a record of services personally performed by Karie Chimera, MD, PhD. It was created on their behalf by Glee Arvin. Manson Passey, OA an ophthalmic technician. The creation of this record is the provider's dictation and/or activities during the visit.    Electronically signed by: Glee Arvin. Kristopher Oppenheim 06.03.2024 4:39 PM   Karie Chimera, M.D., Ph.D. Diseases & Surgery of the Retina and Vitreous [facility]  I have reviewed the above documentation for accuracy and completeness, and I agree with the above. Karie Chimera, M.D., Ph.D. 01/04/23 4:42 PM   Abbreviations: M myopia (nearsighted); A astigmatism; H hyperopia (farsighted); P presbyopia; Mrx spectacle prescription;  CTL contact lenses; OD right eye; OS left eye; OU both eyes  XT  exotropia; ET esotropia; PEK punctate epithelial keratitis; PEE punctate epithelial erosions; DES dry eye syndrome; MGD meibomian gland dysfunction; ATs artificial tears; PFAT's preservative free artificial tears; NSC nuclear sclerotic cataract; PSC posterior subcapsular cataract; ERM epi-retinal membrane; PVD posterior vitreous detachment; RD retinal detachment; DM diabetes mellitus; DR diabetic retinopathy; NPDR non-proliferative diabetic retinopathy; PDR proliferative diabetic retinopathy; CSME clinically significant macular edema; DME diabetic macular edema; dbh dot blot hemorrhages; CWS cotton wool spot; POAG primary open angle glaucoma; C/D cup-to-disc ratio; HVF humphrey visual field; GVF goldmann visual field;  OCT optical coherence tomography; IOP intraocular pressure; BRVO Branch retinal vein occlusion; CRVO central retinal vein occlusion; CRAO central retinal artery occlusion; BRAO branch retinal artery occlusion; RT retinal tear; SB scleral buckle; PPV pars plana vitrectomy; VH Vitreous hemorrhage; PRP panretinal laser photocoagulation; IVK intravitreal kenalog; VMT vitreomacular traction; MH Macular hole;  NVD neovascularization of the disc; NVE neovascularization elsewhere; AREDS age related eye disease study; ARMD age related macular degeneration; POAG primary open angle glaucoma; EBMD epithelial/anterior basement membrane dystrophy; ACIOL anterior chamber intraocular lens; IOL intraocular lens; PCIOL posterior chamber intraocular lens; Phaco/IOL phacoemulsification with intraocular lens placement; PRK photorefractive keratectomy; LASIK laser assisted in situ keratomileusis; HTN hypertension; DM diabetes mellitus; COPD chronic obstructive pulmonary disease

## 2022-12-25 ENCOUNTER — Other Ambulatory Visit: Payer: Self-pay | Admitting: Family Medicine

## 2022-12-25 DIAGNOSIS — K8500 Idiopathic acute pancreatitis without necrosis or infection: Secondary | ICD-10-CM

## 2023-01-04 ENCOUNTER — Ambulatory Visit (INDEPENDENT_AMBULATORY_CARE_PROVIDER_SITE_OTHER): Payer: Medicare Other | Admitting: Ophthalmology

## 2023-01-04 ENCOUNTER — Encounter (INDEPENDENT_AMBULATORY_CARE_PROVIDER_SITE_OTHER): Payer: Self-pay | Admitting: Ophthalmology

## 2023-01-04 DIAGNOSIS — Z961 Presence of intraocular lens: Secondary | ICD-10-CM | POA: Diagnosis not present

## 2023-01-04 DIAGNOSIS — H353211 Exudative age-related macular degeneration, right eye, with active choroidal neovascularization: Secondary | ICD-10-CM

## 2023-01-04 DIAGNOSIS — I10 Essential (primary) hypertension: Secondary | ICD-10-CM

## 2023-01-04 DIAGNOSIS — H353231 Exudative age-related macular degeneration, bilateral, with active choroidal neovascularization: Secondary | ICD-10-CM | POA: Diagnosis not present

## 2023-01-04 DIAGNOSIS — H35033 Hypertensive retinopathy, bilateral: Secondary | ICD-10-CM | POA: Diagnosis not present

## 2023-01-04 DIAGNOSIS — H353221 Exudative age-related macular degeneration, left eye, with active choroidal neovascularization: Secondary | ICD-10-CM

## 2023-01-04 DIAGNOSIS — H04123 Dry eye syndrome of bilateral lacrimal glands: Secondary | ICD-10-CM

## 2023-01-04 MED ORDER — AFLIBERCEPT 2MG/0.05ML IZ SOLN FOR KALEIDOSCOPE
2.0000 mg | INTRAVITREAL | Status: AC | PRN
  Administered 2023-01-04: 2 mg via INTRAVITREAL

## 2023-01-21 ENCOUNTER — Other Ambulatory Visit: Payer: Self-pay | Admitting: Family Medicine

## 2023-01-21 DIAGNOSIS — E039 Hypothyroidism, unspecified: Secondary | ICD-10-CM

## 2023-01-22 ENCOUNTER — Ambulatory Visit (INDEPENDENT_AMBULATORY_CARE_PROVIDER_SITE_OTHER): Payer: Medicare Other | Admitting: Family Medicine

## 2023-01-22 ENCOUNTER — Encounter: Payer: Self-pay | Admitting: Family Medicine

## 2023-01-22 VITALS — BP 130/60 | HR 44 | Temp 98.3°F | Ht 62.25 in | Wt 120.6 lb

## 2023-01-22 DIAGNOSIS — E785 Hyperlipidemia, unspecified: Secondary | ICD-10-CM | POA: Diagnosis not present

## 2023-01-22 DIAGNOSIS — Z0000 Encounter for general adult medical examination without abnormal findings: Secondary | ICD-10-CM

## 2023-01-22 DIAGNOSIS — K8500 Idiopathic acute pancreatitis without necrosis or infection: Secondary | ICD-10-CM | POA: Diagnosis not present

## 2023-01-22 DIAGNOSIS — E039 Hypothyroidism, unspecified: Secondary | ICD-10-CM

## 2023-01-22 DIAGNOSIS — I10 Essential (primary) hypertension: Secondary | ICD-10-CM | POA: Diagnosis not present

## 2023-01-22 MED ORDER — AMLODIPINE BESYLATE 2.5 MG PO TABS: 2.5000 mg | ORAL_TABLET | Freq: Every day | ORAL | 3 refills | Status: DC

## 2023-01-22 MED ORDER — ONDANSETRON 4 MG PO TBDP
4.0000 mg | ORAL_TABLET | Freq: Three times a day (TID) | ORAL | 1 refills | Status: DC | PRN
Start: 2023-01-22 — End: 2023-08-27

## 2023-01-22 NOTE — Patient Instructions (Signed)

## 2023-01-22 NOTE — Progress Notes (Unsigned)
Complete physical exam  Patient: Kaitlyn Parks   DOB: 03/01/1935   88 y.o. Female  MRN: [id number]  Subjective:    Chief Complaint  Patient presents with   Annual Exam    Requesting  a refill on zofran. Pt would like to discuss with provider regarding BP cuff to bring to [place].     Kaitlyn Parks is a 88 y.o. female who presents today for a complete physical exam. She reports consuming a general diet. Gym/ health club routine includes water exercises twice a week. She generally feels well. She reports sleeping fairly well. She does not have additional problems to discuss today.    Most recent fall risk assessment:    01/22/2023    3:56 PM  Fall Risk   Falls in the past year? 0  Number falls in past yr: 0  Injury with Fall? 0  Risk for fall due to : No Fall Risks  Follow up Falls evaluation completed     Most recent depression screenings:    08/06/2022   10:14 AM 08/13/2021   10:32 AM  PHQ 2/9 Scores  PHQ - 2 Score 0 0  PHQ- 9 Score  4    Vision:Within last year and Dental: No current dental problems and Receives regular dental care  Patient Active Problem List   Diagnosis Date Noted   UTI (urinary tract infection) 06/18/2022   Pancreatitis, acute 06/14/2022   Abdominal pain 06/13/2022   Acute pancreatitis 06/13/2022   Hyponatremia 06/13/2022   Gastritis 06/11/2022   Aortic atherosclerosis (HCC) 12/07/2019   Macular degeneration 01/11/2019   Coronary artery disease involving native coronary artery of native heart without angina pectoris 10/29/2018   Presence of intraocular lens 09/23/2018   Bradycardia 11/11/2017   Dyslipidemia 11/11/2017   Pain in joint of right hip 08/04/2017   CAD (coronary artery disease) 06/12/2017   GERD (gastroesophageal reflux disease) 06/12/2017   S/P hip replacement, right 06/12/2017   Osteoarthritis of right hip 06/10/2017   CAD S/P percutaneous coronary angioplasty 02/11/2015   Renal insufficiency 02/11/2015   Prolonged Q-T  interval on ECG 04/18/2010   Hypertension 08/11/2007   Hypothyroidism (acquired) 06/08/2007   MITRAL VALVE PROLAPSE 06/08/2007      Patient Care Team: Karie Georges, MD as PCP - General (Family Medicine) Rollene Rotunda, MD as PCP - Cardiology (Cardiology) Carlus Pavlov, MD as Consulting Physician (Internal Medicine) Tressie Stalker, MD as Consulting Physician (Neurosurgery) Bjorn Pippin, MD as Attending Physician (Urology) Marcelino Duster, PA as Physician Assistant (Cardiology)   Outpatient Medications Prior to Visit  Medication Sig   amLODipine (NORVASC) 2.5 MG tablet Take 1 tablet (2.5 mg total) by mouth daily.   aspirin EC 81 MG tablet Take 81 mg by mouth daily.   Biotin w/ Vitamins C & E (HAIR/SKIN/NAILS PO) Take 1 tablet by mouth daily.   Cranberry 400 MG CAPS Take 400 mg by mouth daily.   hydrALAZINE (APRESOLINE) 10 MG tablet TAKE (1) TABLET TWICE A DAY. MAY TAKE EXTRA DOSE FOR SBP >160 UP TO A TOTAL OF 4 TIMES A DAY. (Patient taking differently: TAKE (1) TABLET TWICE A DAY. MAY TAKE EXTRA DOSE FOR SBP >160 UP TO A TOTAL OF 4 TIMES A DAY. As Needed)   ibuprofen (ADVIL) 400 MG tablet Take 400 mg by mouth every 6 (six) hours as needed for mild pain.   isosorbide mononitrate (IMDUR) 60 MG 24 hr tablet TAKE 1 & 1/2 TABLETS A DAY.   Multiple  Vitamins-Calcium (ONE-A-DAY WOMENS PO) Take 1 tablet by mouth daily.    nitroGLYCERIN (NITROSTAT) 0.4 MG SL tablet DISSOLVE 1 TABLET UNDER TONGUE IF NEEDED FOR CHEST PAIN. MAY REPEAT IN 5 MINUTES FOR 3 DOSES.   NON FORMULARY Get steroid inject for SI joint every 12 wks with Dr. Lorrine Kin [facility]   Polyethyl Glycol-Propyl Glycol (SYSTANE) 0.4-0.3 % GEL ophthalmic gel Place 1 Application into both eyes daily as needed (dry eyes).   PRESCRIPTION MEDICATION Avastin eye injections given by Dr Vanessa Shelley Pooley due to macular degeneration . Every 7wks.   rosuvastatin (CRESTOR) 10 MG tablet TABLET ONE-HALF TABLET BY MOUTH DAILY   SYNTHROID  50 MCG tablet TAKE 1 TABLET EACH DAY. NEED APPOINTMENT FOR ADDITIONAL REFILLS   [DISCONTINUED] ondansetron (ZOFRAN-ODT) 4 MG disintegrating tablet Take 1 tablet (4 mg total) by mouth every 8 (eight) hours as needed for nausea or vomiting.   alum & mag hydroxide-simeth (MAALOX PLUS) 400-400-40 MG/5ML suspension Take 15 mLs by mouth every 6 (six) hours as needed for indigestion. (Patient not taking: Reported on 01/22/2023)   [DISCONTINUED] meloxicam (MOBIC) 7.5 MG tablet Take 1 tablet every day by oral route with meal(s) for 30 days.   [DISCONTINUED] pantoprazole (PROTONIX) 40 MG tablet Take 1 tablet (40 mg total) by mouth daily.   Facility-Administered Medications Prior to Visit    Review of Systems  HENT:  Negative for hearing loss.   Eyes:  Negative for blurred vision.  Respiratory:  Negative for shortness of breath.   Cardiovascular:  Negative for chest pain.  Gastrointestinal: Negative.   Genitourinary: Negative.   Musculoskeletal:  Negative for back pain.  Neurological:  Negative for headaches.  Psychiatric/Behavioral:  Negative for depression.   All other systems reviewed and are negative.      Objective:     BP 130/60 (BP Location: Right Arm, Patient Position: Sitting, Cuff Size: Normal)   Pulse (!) 44   Temp 98.3 F (36.8 C) (Oral)   Ht 5' 2.25" (1.581 m)   Wt 120 lb 9.6 oz (54.7 kg)   SpO2 94%   BMI 21.88 kg/m  BP Readings from Last 3 Encounters:  01/22/23 130/60  10/27/22 (!) 148/62  09/11/22 136/60      Physical Exam Vitals reviewed.  Constitutional:      Appearance: Normal appearance. She is well-groomed and normal weight.  HENT:     Right Ear: Tympanic membrane and ear canal normal.     Left Ear: Tympanic membrane and ear canal normal.     Mouth/Throat:     Mouth: Mucous membranes are moist.     Pharynx: No posterior oropharyngeal erythema.  Eyes:     Conjunctiva/sclera: Conjunctivae normal.  Neck:     Thyroid: No thyromegaly.  Cardiovascular:      Rate and Rhythm: Normal rate and regular rhythm.     Pulses: Normal pulses.     Heart sounds: S1 normal and S2 normal.  Pulmonary:     Effort: Pulmonary effort is normal.     Breath sounds: Normal breath sounds and air entry.  Abdominal:     General: Bowel sounds are normal.  Musculoskeletal:     Right lower leg: No edema.     Left lower leg: No edema.  Lymphadenopathy:     Cervical: No cervical adenopathy.  Neurological:     Mental Status: She is alert and oriented to person, place, and time. Mental status is at baseline.     Gait: Gait is intact.  Psychiatric:  Mood and Affect: Mood and affect normal.        Speech: Speech normal.        Behavior: Behavior normal.        Judgment: Judgment normal.      No results found for any visits on 01/22/23. Last metabolic panel Lab Results  Component Value Date   GLUCOSE 87 06/15/2022   NA 137 06/15/2022   K 4.0 06/15/2022   CL 105 06/15/2022   CO2 23 06/15/2022   BUN 9 06/15/2022   CREATININE 0.61 06/15/2022   GFRNONAA >60 06/15/2022   CALCIUM 8.2 (L) 06/15/2022   PHOS 2.7 06/15/2022   PROT 5.5 (L) 06/15/2022   ALBUMIN 2.7 (L) 06/15/2022   BILITOT 0.9 06/15/2022   ALKPHOS 83 06/15/2022   AST 20 06/15/2022   ALT 22 06/15/2022   ANIONGAP 9 06/15/2022        Assessment & Plan:    Routine Health Maintenance and Physical Exam  Immunization History  Administered Date(s) Administered   Influenza Whole 08/11/2007   Influenza, High Dose Seasonal PF 08/06/2014, 04/30/2017, 06/20/2018, 04/17/2019, 05/07/2021   Influenza,inj,Quad PF,6+ Mos 04/24/2013   Influenza-Unspecified 06/03/2016, 05/04/2019, 04/03/2020, 04/24/2022   PFIZER Comirnaty(Gray Top)Covid-19 Tri-Sucrose Vaccine 05/02/2022, 12/03/2022   PFIZER(Purple Top)SARS-COV-2 Vaccination 08/13/2019, 09/02/2019, 11/02/2019   Pfizer Covid-19 Vaccine Bivalent Booster 46yrs & up 05/07/2021   Pneumococcal Conjugate-13 02/19/2014   Pneumococcal Polysaccharide-23  09/13/2007   Rsv, Bivalent, Protein Subunit Rsvpref,pf (Abrysvo) 09/11/2022   Td 11/04/1995, 09/13/2007   Tdap 03/04/2018   Zoster Recombinat (Shingrix) 03/04/2018, 05/10/2018    Health Maintenance  Topic Date Due   COVID-19 Vaccine (7 - 2023-24 season) 01/28/2023   INFLUENZA VACCINE  03/04/2023   Medicare Annual Wellness (AWV)  08/07/2023   MAMMOGRAM  10/21/2023   DTaP/Tdap/Td (4 - Td or Tdap) 03/04/2028   Pneumonia Vaccine 31+ Years old  Completed   DEXA SCAN  Completed   Zoster Vaccines- Shingrix  Completed   HPV VACCINES  Aged Out    Discussed health benefits of physical activity, and encouraged her to engage in regular exercise appropriate for her age and condition.  Primary hypertension -     amLODIPine Besylate; Take 1 tablet (2.5 mg total) by mouth daily.  Dispense: 90 tablet; Refill: 3  Idiopathic acute pancreatitis without infection or necrosis -     Ondansetron; Take 1 tablet (4 mg total) by mouth every 8 (eight) hours as needed for nausea or vomiting.  Dispense: 30 tablet; Refill: 1  Dyslipidemia -     Lipid panel; Future  Hypothyroidism (acquired) -     TSH; Future  Routine general medical examination at a health care facility  Normal physical exam findings today, HR is slow but regular, this is chronic for her. Checking her annual labs today for surveillance. RTC in 6 months for follow up.   Return in 6 months (on 07/24/2023).     Karie Georges, MD

## 2023-02-03 ENCOUNTER — Other Ambulatory Visit (INDEPENDENT_AMBULATORY_CARE_PROVIDER_SITE_OTHER): Payer: Medicare Other

## 2023-02-03 DIAGNOSIS — E039 Hypothyroidism, unspecified: Secondary | ICD-10-CM | POA: Diagnosis not present

## 2023-02-03 DIAGNOSIS — E785 Hyperlipidemia, unspecified: Secondary | ICD-10-CM | POA: Diagnosis not present

## 2023-02-03 LAB — LIPID PANEL
Cholesterol: 134 mg/dL (ref 0–200)
HDL: 49.6 mg/dL (ref >39.00)
LDL Cholesterol: 61 mg/dL (ref 0–99)
NonHDL: 83.94
Total CHOL/HDL Ratio: 3
Triglycerides: 113 mg/dL (ref 0.0–149.0)
VLDL: 22.6 mg/dL (ref 0.0–40.0)

## 2023-02-03 LAB — TSH: TSH: 2.25 u[IU]/mL (ref 0.35–5.50)

## 2023-02-08 ENCOUNTER — Telehealth: Payer: Self-pay | Admitting: Cardiology

## 2023-02-08 NOTE — Telephone Encounter (Signed)
Pt c/o Shortness Of Breath: STAT if SOB developed within the last 24 hours or pt is noticeably SOB on the phone  1. Are you currently SOB (can you hear that pt is SOB on the phone)? No  2. How long have you been experiencing SOB? Month   3. Are you SOB when sitting or when up moving around? Up moving around   4. Are you currently experiencing any other symptoms? Dizziness

## 2023-02-08 NOTE — Telephone Encounter (Signed)
Patient states SOB over the last month, on and off.  It with strenuous exercise.  Especially with walking up steps.  Patient states BP is good.  160 twice in the last week attributed to salty food. 131/81 at present HR 50.  She had swollen ankles when out of the country and this is first time this has happened.  This resolved once returned (a week ago).  Appt made for Friday with NP

## 2023-02-09 ENCOUNTER — Encounter: Payer: Self-pay | Admitting: Family Medicine

## 2023-02-09 ENCOUNTER — Ambulatory Visit: Payer: Medicare Other | Admitting: Family Medicine

## 2023-02-09 VITALS — BP 110/58 | HR 42 | Temp 98.6°F | Wt 123.2 lb

## 2023-02-09 DIAGNOSIS — K529 Noninfective gastroenteritis and colitis, unspecified: Secondary | ICD-10-CM

## 2023-02-09 MED ORDER — CIPROFLOXACIN HCL 500 MG PO TABS: 500.0000 mg | ORAL_TABLET | Freq: Two times a day (BID) | ORAL | 0 refills | Status: DC

## 2023-02-09 NOTE — Progress Notes (Signed)
   Subjective:    Patient ID: Kaitlyn Parks, female    DOB: Jun 27, 1935, 88 y.o.   MRN: [id number]  HPI Here for 10 days of mild abdominal cramping, diarrhea, and nausea. No vomiting and no fever. She developed these symptoms right after she came home from a trip to [place]. She was with her 2 daughters, but they have felt fine. She is drinking fluids and taking Imodium.    Review of Systems  Constitutional: Negative.   Respiratory: Negative.    Cardiovascular: Negative.   Gastrointestinal:  Positive for abdominal pain, diarrhea and nausea. Negative for abdominal distention, blood in stool, constipation and vomiting.  Genitourinary: Negative.        Objective:   Physical Exam Constitutional:      Appearance: Normal appearance. She is not ill-appearing.  Cardiovascular:     Rate and Rhythm: Normal rate and regular rhythm.     Pulses: Normal pulses.     Heart sounds: Normal heart sounds.  Pulmonary:     Effort: Pulmonary effort is normal.     Breath sounds: Normal breath sounds.  Abdominal:     General: Abdomen is flat. Bowel sounds are normal. There is no distension.     Palpations: Abdomen is soft. There is no mass.     Tenderness: There is no abdominal tenderness. There is no right CVA tenderness, left CVA tenderness, guarding or rebound.     Hernia: No hernia is present.  Neurological:     Mental Status: She is alert.           Assessment & Plan:  Enteritis, treat with 10 days of Cipro. Follow up as needed.  Gershon Crane, MD

## 2023-02-10 NOTE — Progress Notes (Signed)
Cardiology Clinic Note   Patient Name: Kaitlyn Parks Date of Encounter: 02/12/2023  Primary Care Provider:  Karie Georges, MD Primary Cardiologist:  Rollene Rotunda, MD  Patient Profile    88 year old female with hx of CAD, PCI of LAD in 2012. Cardiac catheterization performed on 06/14/2017 showed 95% ostial D1 lesion, widely patent ostial LAD stent, 30% proximal LAD stenosis. Medical therapy was recommended.   Past Medical History    Past Medical History:  Diagnosis Date   Arthritis    DDD, scoliosis, sees Dr. Lovell Sheehan for this, uses norco very rarely for pain   Bradycardia 11/11/2017   CAD (coronary artery disease)    LAD stenting of a 90% lesion 2012   Cystocele    Educated about COVID-19 virus infection 12/06/2019   Elevated cholesterol    GERD (gastroesophageal reflux disease)    dx in work up 2016 for atypical CP at OSH   pt. denies   Heart murmur    Hypertensive retinopathy    OU   Hypothyroidism    Interstitial cystitis    sees Dr. [name]   Macular degeneration    OU   NSTEMI (non-ST elevated myocardial infarction) [facility])    Osteoporosis    Rectocele    S/P hip replacement, right 06/12/2017   Scoliosis    Thyroid disease    Hypothyroid   Urinary incontinence    USI   Uterine prolapse    Past Surgical History:  Procedure Laterality Date   BLADDER SUSPENSION  2011   CATARACT EXTRACTION Bilateral 2015   Dr. Elmer Picker   CORONARY ANGIOPLASTY WITH STENT PLACEMENT  04/21/2010   LAD 80%, RCA 30%, nl EF, s/p DES LAD   EYE SURGERY     LEFT HEART CATH AND CORONARY ANGIOGRAPHY N/A 06/14/2017   Procedure: LEFT HEART CATH AND CORONARY ANGIOGRAPHY;  Surgeon: Runell Gess, MD;  Location: [facility];  Service: Cardiovascular;  Laterality: N/A;   OOPHORECTOMY  2011   BSO   TOTAL HIP ARTHROPLASTY Right 06/10/2017   Procedure: RIGHT TOTAL HIP ARTHROPLASTY ANTERIOR APPROACH;  Surgeon: Samson Frederic, MD;  Location: [facility];  Service: Orthopedics;  Laterality:  Right;  Needs RNFA   VAGINAL HYSTERECTOMY  2011   [facility] BSO; benign    Allergies  Allergies  Allergen Reactions   Acyclovir And Related Other (See Comments)    unknown   Pravachol [Pravastatin Sodium] Other (See Comments)    cystitis   Sulfa Antibiotics     nausea   Zocor [Simvastatin] Other (See Comments)    cystitis    History of Present Illness    [name] returns to the office today with complaints of lower extremity edema and dyspnea on exertion 2 weeks ago while on a trip to [place] with her family.  She states that she wore compression hose on the plane trip over, but noticed that she had some lower extremity edema and heaviness in her legs with walking causing her to have some mild shortness of breath after being there a day.  She admits to eating some rich salted foods during her time there.  Upon returning home the patient's lower extremity edema and breathing status improved rapidly.  She did not make any changes in her medication regimen.    She did notice during her time in [place] that her blood pressure rose into the 170/80 range, at which time she took a dose of hydralazine 10 mg, which she had to do  twice while away.  Since returning home she has not had to take extra doses.  She denied chest pain, palpitations, near syncope, headache, unilateral leg pain and swelling or dizziness associated with elevated blood pressure or lower extremity edema.  Home Medications    Current Outpatient Medications  Medication Sig Dispense Refill   alum & mag hydroxide-simeth (MAALOX PLUS) 400-400-40 MG/5ML suspension Take 15 mLs by mouth every 6 (six) hours as needed for indigestion. 355 mL 0   amLODipine (NORVASC) 2.5 MG tablet Take 1 tablet (2.5 mg total) by mouth daily. 90 tablet 3   aspirin EC 81 MG tablet Take 81 mg by mouth daily.     Biotin w/ Vitamins C & E (HAIR/SKIN/NAILS PO) Take 1 tablet by mouth daily.     ciprofloxacin (CIPRO) 500 MG tablet Take 1 tablet (500 mg  total) by mouth 2 (two) times daily for 10 days. 20 tablet 0   Cranberry 400 MG CAPS Take 400 mg by mouth daily.     hydrALAZINE (APRESOLINE) 10 MG tablet TAKE (1) TABLET TWICE A DAY. MAY TAKE EXTRA DOSE FOR SBP >160 UP TO A TOTAL OF 4 TIMES A DAY. (Patient taking differently: TAKE (1) TABLET TWICE A DAY. MAY TAKE EXTRA DOSE FOR SBP >160 UP TO A TOTAL OF 4 TIMES A DAY. As Needed) 180 tablet 3   ibuprofen (ADVIL) 400 MG tablet Take 400 mg by mouth every 6 (six) hours as needed for mild pain.     isosorbide mononitrate (IMDUR) 60 MG 24 hr tablet TAKE 1 & 1/2 TABLETS A DAY. 135 tablet 1   Multiple Vitamins-Calcium (ONE-A-DAY WOMENS PO) Take 1 tablet by mouth daily.      nitroGLYCERIN (NITROSTAT) 0.4 MG SL tablet DISSOLVE 1 TABLET UNDER TONGUE IF NEEDED FOR CHEST PAIN. MAY REPEAT IN 5 MINUTES FOR 3 DOSES. 25 tablet 3   NON FORMULARY Get steroid inject for SI joint every 12 wks with Dr. Smitty Pluck surgery spine     ondansetron (ZOFRAN-ODT) 4 MG disintegrating tablet Take 1 tablet (4 mg total) by mouth every 8 (eight) hours as needed for nausea or vomiting. 30 tablet 1   Polyethyl Glycol-Propyl Glycol (SYSTANE) 0.4-0.3 % GEL ophthalmic gel Place 1 Application into both eyes daily as needed (dry eyes).     PRESCRIPTION MEDICATION Avastin eye injections given by Dr Vanessa Barbara due to macular degeneration . Every 7wks.     rosuvastatin (CRESTOR) 10 MG tablet TABLET ONE-HALF TABLET BY MOUTH DAILY 45 tablet 3   SYNTHROID 50 MCG tablet TAKE 1 TABLET EACH DAY. NEED APPOINTMENT FOR ADDITIONAL REFILLS 90 tablet 0   No current facility-administered medications for this visit.   Facility-Administered Medications Ordered in Other Visits  Medication Dose Route Frequency Provider Last Rate Last Admin   technetium tetrofosmin (TC-MYOVIEW) injection 32.9 millicurie  32.9 millicurie Intravenous Once PRN Turner, Cornelious Bryant, MD         Family History    Family History  Problem Relation Age of Onset   Hypertension  Mother    Heart disease Mother    Heart disease Father    Heart disease Sister    Diabetes Sister    Uterine cancer Sister        mets to lungs   Lung cancer Sister    Scoliosis Sister    Heart disease Brother    Colon cancer Paternal Aunt 22   Breast cancer Paternal Aunt        Age 14's  Breast cancer Paternal Aunt    Leukemia Paternal Aunt    Lung cancer Paternal Grandfather        smoker   Arthritis Daughter    Breast cancer Cousin        Maternal 1st cousins-Age 32's   Esophageal cancer Neg Hx    Pancreatic cancer Neg Hx    Stomach cancer Neg Hx    She indicated that her mother is deceased. She indicated that her father is deceased. She indicated that only one of her two sisters is alive. She indicated that her brother is alive. She indicated that her maternal grandmother is deceased. She indicated that her maternal grandfather is deceased. She indicated that her paternal grandmother is deceased. She indicated that her paternal grandfather is deceased. She indicated that both of her daughters are alive. She indicated that the status of her cousin is unknown. She indicated that the status of her neg hx is unknown.  Social History    Social History   Socioeconomic History   Marital status: Widowed    Spouse name: Not on file   Number of children: 2   Years of education: Not on file   Highest education level: Associate degree: occupational, Scientist, product/process development, or vocational program  Occupational History   Occupation: retired  Tobacco Use   Smoking status: Never   Smokeless tobacco: Never  Vaping Use   Vaping status: Never Used  Substance and Sexual Activity   Alcohol use: No    Comment: Rare   Drug use: No   Sexual activity: Never    Birth control/protection: Surgical  Other Topics Concern   Not on file  Social History Narrative   Work or School:  none      Home Situation: lives with husband      Spiritual Beliefs: Methodist      Lifestyle: regular exercise (yoga,  water aerobics); diet is healthy      Social Determinants of Health   Financial Resource Strain: Low Risk  (10/26/2022)   Overall Financial Resource Strain (CARDIA)    Difficulty of Paying Living Expenses: Not hard at all  Food Insecurity: No Food Insecurity (10/26/2022)   Hunger Vital Sign    Worried About Running Out of Food in the Last Year: Never true    Ran Out of Food in the Last Year: Never true  Transportation Needs: No Transportation Needs (10/26/2022)   PRAPARE - Administrator, Civil Service (Medical): No    Lack of Transportation (Non-Medical): No  Physical Activity: Sufficiently Active (10/26/2022)   Exercise Vital Sign    Days of Exercise per Week: 3 days    Minutes of Exercise per Session: 60 min  Stress: No Stress Concern Present (10/26/2022)   [employer] of Occupational Health - Occupational Stress Questionnaire    Feeling of Stress : Not at all  Social Connections: Moderately Integrated (10/26/2022)   Social Connection and Isolation Panel [NHANES]    Frequency of Communication with Friends and Family: More than three times a week    Frequency of Social Gatherings with Friends and Family: Three times a week    Attends Religious Services: More than 4 times per year    Active Member of Clubs or Organizations: Yes    Attends Banker Meetings: More than 4 times per year    Marital Status: Widowed  Intimate Partner Violence: Not At Risk (08/06/2022)   Humiliation, Afraid, Rape, and Kick questionnaire    Fear of Current  or Ex-Partner: No    Emotionally Abused: No    Physically Abused: No    Sexually Abused: No     Review of Systems    General:  No chills, fever, night sweats or weight changes.  Cardiovascular:  No chest pain, positive for transient dyspnea on exertion, positive for transient lower extremity edema, orthopnea, palpitations, paroxysmal nocturnal dyspnea. Dermatological: No rash, lesions/masses Respiratory: No cough,  dyspnea Urologic: No hematuria, dysuria Abdominal:   No nausea, vomiting, diarrhea, bright red blood per rectum, melena, or hematemesis Neurologic:  No visual changes, wkns, changes in mental status. All other systems reviewed and are otherwise negative except as noted above.       Physical Exam    VS:  BP (!) 150/70 (BP Location: Left Arm, Patient Position: Sitting, Cuff Size: Normal)   Pulse (!) 49   Ht 5\' 3"  (1.6 m)   Wt 125 lb 9.6 oz (57 kg)   SpO2 99%   BMI 22.25 kg/m  , BMI Body mass index is 22.25 kg/m.     GEN: Well nourished, well developed, in no acute distress. HEENT: normal. Neck: Supple, no JVD, carotid bruits, or masses. Cardiac: RRR, no murmurs, rubs, or gallops. No clubbing, cyanosis, edema.  Varicosities are noted bilaterally lower extremities radials/DP/PT 2+ and equal bilaterally.  Respiratory:  Respirations regular and unlabored, clear to auscultation bilaterally. GI: Soft, nontender, nondistended, BS + x 4. MS: no deformity or atrophy. Skin: warm and dry, no rash. Neuro:  Strength and sensation are intact. Psych: Normal affect.      Lab Results  Component Value Date   WBC 5.1 06/15/2022   HGB 11.1 (L) 06/15/2022   HCT 34.8 (L) 06/15/2022   MCV 96.9 06/15/2022   PLT 273 06/15/2022   Lab Results  Component Value Date   CREATININE 0.61 06/15/2022   BUN 9 06/15/2022   NA 137 06/15/2022   K 4.0 06/15/2022   CL 105 06/15/2022   CO2 23 06/15/2022   Lab Results  Component Value Date   ALT 22 06/15/2022   AST 20 06/15/2022   ALKPHOS 83 06/15/2022   BILITOT 0.9 06/15/2022   Lab Results  Component Value Date   CHOL 134 02/03/2023   HDL 49.60 02/03/2023   LDLCALC 61 02/03/2023   LDLDIRECT 140.6 05/25/2012   TRIG 113.0 02/03/2023   CHOLHDL 3 02/03/2023    Lab Results  Component Value Date   HGBA1C 5.6 03/05/2021     Review of Prior Studies  NM Stress Test 10/27/2021    ECG is abnormal.  Baseline EKG showed junctional bradycardia.   No  ST deviation was noted.   LV perfusion is normal. There is no evidence of ischemia. There is no evidence of infarction.   Left ventricular function is normal. Nuclear stress EF: 64 %. The left ventricular ejection fraction is normal (55-65%). End diastolic cavity size is normal. End systolic cavity size is normal.   Prior study available for comparison from 05/29/2020.   The study is normal. The study is low risk.    Echocardiogram 10/27/2021  1. Left ventricular ejection fraction, by estimation, is 60 to 65%. Left  ventricular ejection fraction by 3D volume is 61 %. The left ventricle has  normal function. The left ventricle has no regional wall motion  abnormalities. Left ventricular diastolic   parameters are consistent with Grade I diastolic dysfunction (impaired  relaxation).   2. Right ventricular systolic function is normal. The right ventricular  size is  normal. There is normal pulmonary artery systolic pressure. The  estimated right ventricular systolic pressure is 19.5 mmHg.   3. The mitral valve is normal in structure. Mild to moderate mitral valve  regurgitation.   4. The aortic valve is tricuspid. There is mild calcification of the  aortic valve. There is mild thickening of the aortic valve. Aortic valve  regurgitation is mild. Aortic valve sclerosis/calcification is present,  without any evidence of aortic  stenosis.   5. The inferior vena cava is normal in size with greater than 50%  respiratory variability, suggesting right atrial pressure of 3 mmHg.   Assessment & Plan   1.  Transient dyspnea on exertion and lower extremity edema: Based upon her symptoms it does not appear that she had a DVT or PE.  Swelling immediately improved after returning home.  She did admit to eating a good bit of salted foods and rich foods while away.  Do not feel I need to do any vascular ultrasound at this time.  There is no pain or swelling in her legs on inspection.  Lungs are clear.    2.   Hypertension: Transient elevation in blood pressure while away eating salty foods while visiting [place].  She did take an extra dose of hydralazine twice.  Blood pressure has normalized since returning home.  Continue to monitor this for transient elevations while eating normally and avoiding salt rich foods.  I rechecked her blood pressure in the clinic room and it was 138/68 significantly better than initial blood pressure during rooming.  3.  Hypercholesterolemia: Remains on statin therapy.  Labs are followed by PCP.  Continue low-cholesterol diet.  4.  Coronary artery disease: Per cath on 06/14/2017 first diagonal 95% stenosed, previously placed ostial to proximal LAD stent was widely patent with proximal LAD lesion.  Continue secondary prevention with statin therapy, low-cholesterol diet, and blood pressure control.  No changes in her EKG.         Signed, Bettey Mare. Liborio Nixon, ANP, AACC   02/12/2023 4:55 PM      Office [phone] Fax [phone]  Notice: This dictation was prepared with Dragon dictation along with smaller phrase technology. Any transcriptional errors that result from this process are unintentional and may not be corrected upon review.

## 2023-02-12 ENCOUNTER — Encounter: Payer: Self-pay | Admitting: Adult Health

## 2023-02-12 ENCOUNTER — Ambulatory Visit: Payer: Medicare Other | Attending: Adult Health | Admitting: Adult Health

## 2023-02-12 VITALS — BP 150/70 | HR 49 | Ht 63.0 in | Wt 125.6 lb

## 2023-02-12 DIAGNOSIS — I872 Venous insufficiency (chronic) (peripheral): Secondary | ICD-10-CM

## 2023-02-12 DIAGNOSIS — I2510 Atherosclerotic heart disease of native coronary artery without angina pectoris: Secondary | ICD-10-CM | POA: Diagnosis not present

## 2023-02-12 DIAGNOSIS — I10 Essential (primary) hypertension: Secondary | ICD-10-CM | POA: Diagnosis not present

## 2023-02-12 DIAGNOSIS — E785 Hyperlipidemia, unspecified: Secondary | ICD-10-CM

## 2023-02-12 NOTE — Patient Instructions (Signed)
Medication Instructions:  Your physician recommends that you continue on your current medications as directed. Please refer to the Current Medication list given to you today.  *If you need a refill on your cardiac medications before your next appointment, please call your pharmacy*   Lab Work: None needed  If you have labs (blood work) drawn today and your tests are completely normal, you will receive your results only by: MyChart Message (if you have MyChart) OR A paper copy in the mail If you have any lab test that is abnormal or we need to change your treatment, we will call you to review the results.   Testing/Procedures: None needed   Follow-Up: At [facility], you and your health needs are our priority.  As part of our continuing mission to provide you with exceptional heart care, we have created designated Provider Care Teams.  These Care Teams include your primary Cardiologist (physician) and Advanced Practice Providers (APPs -  Physician Assistants and Nurse Practitioners) who all work together to provide you with the care you need, when you need it.  We recommend signing up for the patient portal called "MyChart".  Sign up information is provided on this After Visit Summary.  MyChart is used to connect with patients for Virtual Visits (Telemedicine).  Patients are able to view lab/test results, encounter notes, upcoming appointments, etc.  Non-urgent messages can be sent to your provider as well.   To learn more about what you can do with MyChart, go to [email or web address].    Your next appointment:   6 month(s)  Provider:   Rollene Rotunda, MD

## 2023-02-13 ENCOUNTER — Other Ambulatory Visit: Payer: Self-pay

## 2023-02-13 ENCOUNTER — Emergency Department (HOSPITAL_COMMUNITY)
Admission: EM | Admit: 2023-02-13 | Discharge: 2023-02-14 | Disposition: A | Discharge status: Home / Self Care | Payer: Medicare Other | Attending: Emergency Medicine | Admitting: Emergency Medicine

## 2023-02-13 ENCOUNTER — Encounter (HOSPITAL_COMMUNITY): Payer: Self-pay | Admitting: Emergency Medicine

## 2023-02-13 DIAGNOSIS — E039 Hypothyroidism, unspecified: Secondary | ICD-10-CM | POA: Diagnosis not present

## 2023-02-13 DIAGNOSIS — Z7982 Long term (current) use of aspirin: Secondary | ICD-10-CM | POA: Diagnosis not present

## 2023-02-13 DIAGNOSIS — I2510 Atherosclerotic heart disease of native coronary artery without angina pectoris: Secondary | ICD-10-CM | POA: Insufficient documentation

## 2023-02-13 DIAGNOSIS — I10 Essential (primary) hypertension: Secondary | ICD-10-CM | POA: Insufficient documentation

## 2023-02-13 DIAGNOSIS — R42 Dizziness and giddiness: Secondary | ICD-10-CM | POA: Diagnosis not present

## 2023-02-13 DIAGNOSIS — Z79899 Other long term (current) drug therapy: Secondary | ICD-10-CM | POA: Diagnosis not present

## 2023-02-13 LAB — CBC WITH DIFFERENTIAL/PLATELET
Abs Immature Granulocytes: 0.03 10*3/uL (ref 0.00–0.07)
Basophils Absolute: 0.1 10*3/uL (ref 0.0–0.1)
Basophils Relative: 1 %
Eosinophils Absolute: 0.2 10*3/uL (ref 0.0–0.5)
Eosinophils Relative: 3 %
HCT: 39.4 % (ref 36.0–46.0)
Hemoglobin: 12.5 g/dL (ref 12.0–15.0)
Immature Granulocytes: 1 %
Lymphocytes Relative: 22 %
Lymphs Abs: 1.2 10*3/uL (ref 0.7–4.0)
MCH: 30 pg (ref 26.0–34.0)
MCHC: 31.7 g/dL (ref 30.0–36.0)
MCV: 94.7 fL (ref 80.0–100.0)
Monocytes Absolute: 0.6 10*3/uL (ref 0.1–1.0)
Monocytes Relative: 10 %
Neutro Abs: 3.5 10*3/uL (ref 1.7–7.7)
Neutrophils Relative %: 63 %
Platelets: 270 10*3/uL (ref 150–400)
RBC: 4.16 MIL/uL (ref 3.87–5.11)
RDW: 12.4 % (ref 11.5–15.5)
WBC: 5.5 10*3/uL (ref 4.0–10.5)
nRBC: 0 % (ref 0.0–0.2)

## 2023-02-13 LAB — CBG MONITORING, ED: Glucose-Capillary: 112 mg/dL — ABNORMAL HIGH (ref 70–99)

## 2023-02-13 MED ORDER — MECLIZINE HCL 25 MG PO TABS
25.0000 mg | ORAL_TABLET | Freq: Once | ORAL | Status: AC
  Administered 2023-02-13: 25 mg via ORAL
  Filled 2023-02-13: qty 1

## 2023-02-13 MED ORDER — SODIUM CHLORIDE 0.9 % IV BOLUS
1000.0000 mL | Freq: Once | INTRAVENOUS | Status: AC
  Administered 2023-02-13: 1000 mL via INTRAVENOUS

## 2023-02-13 MED ORDER — PROCHLORPERAZINE EDISYLATE 10 MG/2ML IJ SOLN
5.0000 mg | Freq: Once | INTRAMUSCULAR | Status: AC
  Administered 2023-02-13: 5 mg via INTRAVENOUS
  Filled 2023-02-13: qty 2

## 2023-02-13 NOTE — ED Provider Notes (Signed)
[place] EMERGENCY DEPARTMENT AT [facility] Provider Note  CSN: [id number] Arrival date & time: 02/13/23 2233  Chief Complaint(s) Dizziness  HPI Kaitlyn Parks is a 88 y.o. female {Add pertinent medical, surgical, social history, OB history to HPI:1}    Dizziness Quality:  Head spinning Severity:  Severe Onset quality:  Gradual Duration:  3 days Timing: initially intermittent; now constant since this morning. Progression:  Worsening Chronicity:  New Context comment:  Recent trip from [place]. on Cipro for diarrhea on going for several weeks Relieved by:  Being still Worsened by:  Movement, turning head and sitting upright Associated symptoms: nausea   Associated symptoms: no chest pain, no palpitations, no shortness of breath, no tinnitus, no vision changes and no vomiting     Past Medical History Past Medical History:  Diagnosis Date   Arthritis    DDD, scoliosis, sees Dr. Lovell Sheehan for this, uses norco very rarely for pain   Bradycardia 11/11/2017   CAD (coronary artery disease)    LAD stenting of a 90% lesion 2012   Cystocele    Educated about COVID-19 virus infection 12/06/2019   Elevated cholesterol    GERD (gastroesophageal reflux disease)    dx in work up 2016 for atypical CP at OSH   pt. denies   Heart murmur    Hypertensive retinopathy    OU   Hypothyroidism    Interstitial cystitis    sees Dr. [name]   Macular degeneration    OU   NSTEMI (non-ST elevated myocardial infarction) [facility])    Osteoporosis    Rectocele    S/P hip replacement, right 06/12/2017   Scoliosis    Thyroid disease    Hypothyroid   Urinary incontinence    USI   Uterine prolapse    Patient Active Problem List   Diagnosis Date Noted   UTI (urinary tract infection) 06/18/2022   Pancreatitis, acute 06/14/2022   Abdominal pain 06/13/2022   Acute pancreatitis 06/13/2022   Hyponatremia 06/13/2022   Gastritis 06/11/2022   Aortic atherosclerosis (HCC) 12/07/2019   Macular  degeneration 01/11/2019   Coronary artery disease involving native coronary artery of native heart without angina pectoris 10/29/2018   Presence of intraocular lens 09/23/2018   Bradycardia 11/11/2017   Dyslipidemia 11/11/2017   Pain in joint of right hip 08/04/2017   CAD (coronary artery disease) 06/12/2017   GERD (gastroesophageal reflux disease) 06/12/2017   S/P hip replacement, right 06/12/2017   Osteoarthritis of right hip 06/10/2017   CAD S/P percutaneous coronary angioplasty 02/11/2015   Renal insufficiency 02/11/2015   Prolonged Q-T interval on ECG 04/18/2010   Hypertension 08/11/2007   Hypothyroidism (acquired) 06/08/2007   MITRAL VALVE PROLAPSE 06/08/2007   Home Medication(s) Prior to Admission medications   Medication Sig Start Date End Date Taking? Authorizing Provider  alum & mag hydroxide-simeth (MAALOX PLUS) 400-400-40 MG/5ML suspension Take 15 mLs by mouth every 6 (six) hours as needed for indigestion. 04/23/22   Mesner, Barbara Cower, MD  amLODipine (NORVASC) 2.5 MG tablet Take 1 tablet (2.5 mg total) by mouth daily. 01/22/23   Karie Georges, MD  aspirin EC 81 MG tablet Take 81 mg by mouth daily.    [provider]  Biotin w/ Vitamins C & E (HAIR/SKIN/NAILS PO) Take 1 tablet by mouth daily.    [provider]  ciprofloxacin (CIPRO) 500 MG tablet Take 1 tablet (500 mg total) by mouth 2 (two) times daily for 10 days. 02/09/23 02/19/23  Nelwyn Salisbury, MD  Cranberry 400 MG CAPS Take 400 mg by mouth daily.    [provider]  hydrALAZINE (APRESOLINE) 10 MG tablet TAKE (1) TABLET TWICE A DAY. MAY TAKE EXTRA DOSE FOR SBP >160 UP TO A TOTAL OF 4 TIMES A DAY. Patient taking differently: TAKE (1) TABLET TWICE A DAY. MAY TAKE EXTRA DOSE FOR SBP >160 UP TO A TOTAL OF 4 TIMES A DAY. As Needed 10/17/21   Rollene Rotunda, MD  ibuprofen (ADVIL) 400 MG tablet Take 400 mg by mouth every 6 (six) hours as needed for mild pain.    [provider]  isosorbide  mononitrate (IMDUR) 60 MG 24 hr tablet TAKE 1 & 1/2 TABLETS A DAY. 10/28/22   Karie Georges, MD  Multiple Vitamins-Calcium (ONE-A-DAY WOMENS PO) Take 1 tablet by mouth daily.     [provider]  nitroGLYCERIN (NITROSTAT) 0.4 MG SL tablet DISSOLVE 1 TABLET UNDER TONGUE IF NEEDED FOR CHEST PAIN. MAY REPEAT IN 5 MINUTES FOR 3 DOSES. 10/06/21   Rollene Rotunda, MD  NON FORMULARY Get steroid inject for SI joint every 12 wks with Dr. Smitty Pluck surgery spine    [provider]  ondansetron (ZOFRAN-ODT) 4 MG disintegrating tablet Take 1 tablet (4 mg total) by mouth every 8 (eight) hours as needed for nausea or vomiting. 01/22/23   Karie Georges, MD  Polyethyl Glycol-Propyl Glycol (SYSTANE) 0.4-0.3 % GEL ophthalmic gel Place 1 Application into both eyes daily as needed (dry eyes).    [provider]  PRESCRIPTION MEDICATION Avastin eye injections given by Dr Vanessa Barbara due to macular degeneration . Every 7wks.    [provider]  rosuvastatin (CRESTOR) 10 MG tablet TABLET ONE-HALF TABLET BY MOUTH DAILY 10/27/22   Rollene Rotunda, MD  SYNTHROID 50 MCG tablet TAKE 1 TABLET EACH DAY. NEED APPOINTMENT FOR ADDITIONAL REFILLS 01/22/23   Karie Georges, MD                                                                                                                                    Allergies Acyclovir and related, Pravachol [pravastatin sodium], Sulfa antibiotics, and Zocor [simvastatin]  Review of Systems Review of Systems  HENT:  Negative for tinnitus.   Respiratory:  Negative for shortness of breath.   Cardiovascular:  Negative for chest pain and palpitations.  Gastrointestinal:  Positive for nausea. Negative for vomiting.  Neurological:  Positive for dizziness.   As noted in HPI  Physical Exam Vital Signs  I have reviewed the triage vital signs BP (!) 173/80 (BP Location: Right Arm)   Pulse (!) 58   Temp 98.2 F (36.8 C) (Oral)   Resp 18   Ht 5'  3" (1.6 m)   Wt 57 kg   SpO2 96%   BMI 22.26 kg/m  *** Physical Exam Vitals reviewed.  Constitutional:      General: She is not in acute distress.  Appearance: She is well-developed. She is not diaphoretic.  HENT:     Head: Normocephalic and atraumatic.     Nose: Nose normal.  Eyes:     General: No scleral icterus.       Right eye: No discharge.        Left eye: No discharge.     Conjunctiva/sclera: Conjunctivae normal.     Pupils: Pupils are equal, round, and reactive to light.  Cardiovascular:     Rate and Rhythm: Normal rate and regular rhythm.     Heart sounds: No murmur heard.    No friction rub. No gallop.  Pulmonary:     Effort: Pulmonary effort is normal. No respiratory distress.     Breath sounds: Normal breath sounds. No stridor. No rales.  Abdominal:     General: There is no distension.     Palpations: Abdomen is soft.     Tenderness: There is no abdominal tenderness.  Musculoskeletal:        General: No tenderness.     Cervical back: Normal range of motion and neck supple.  Skin:    General: Skin is warm and dry.     Findings: No erythema or rash.  Neurological:     Mental Status: She is alert and oriented to person, place, and time.     Comments: Mental Status:  Alert and oriented to person, place, and time.  Attention and concentration normal.  Speech clear.  Recent memory is intact  Cranial Nerves:  II Visual Fields: Intact to confrontation. Visual fields intact. III, IV, VI: Pupils equal and reactive to light and near. Full eye movement without nystagmus  V Facial Sensation: Normal. No weakness of masticatory muscles  VII: No facial weakness or asymmetry  VIII Auditory Acuity: Grossly normal  IX/X: The uvula is midline; the palate elevates symmetrically  XI: Normal sternocleidomastoid and trapezius strength  XII: The tongue is midline. No atrophy or fasciculations.   Motor System: Muscle Strength: 5/5 and symmetric in the upper and lower  extremities. No pronation or drift.  Muscle Tone: Tone and muscle bulk are normal in the upper and lower extremities.  Coordination: Intact finger-to-nose, heel-to-shin. No tremor.  Sensation: Intact to light touch. Gait: Routine gait mild unstable      ED Results and Treatments Labs (all labs ordered are listed, but only abnormal results are displayed) Labs Reviewed  CBG MONITORING, ED - Abnormal; Notable for the following components:      Result Value   Glucose-Capillary 112 (*)    All other components within normal limits  CBC WITH DIFFERENTIAL/PLATELET  COMPREHENSIVE METABOLIC PANEL  MAGNESIUM                                                                                                                         EKG  EKG Interpretation Date/Time:  Saturday February 13 2023 22:57:31 EDT Ventricular Rate:  50 PR Interval:  120 QRS Duration:  101 QT Interval:  495  QTC Calculation: 452 R Axis:   36  Text Interpretation: Ectopic atrial rhythm Atrial premature complexes Confirmed by Drema Pry [phone]) on 02/13/2023 11:04:05 PM       Radiology No results found.  Medications Ordered in ED Medications  sodium chloride 0.9 % bolus 1,000 mL (1,000 mLs Intravenous New Bag/Given 02/13/23 2322)  meclizine (ANTIVERT) tablet 25 mg (25 mg Oral Given 02/13/23 2330)  prochlorperazine (COMPAZINE) injection 5 mg (5 mg Intravenous Given 02/13/23 2328)   Procedures Procedures  (including critical care time) Medical Decision Making / ED Course   Medical Decision Making Amount and/or Complexity of Data Reviewed Labs: ordered. Decision-making details documented in ED Course. ECG/medicine tests: ordered and independent interpretation performed. Decision-making details documented in ED Course.  Risk Prescription drug management. Decision regarding hospitalization.    Patient presents for dizziness. Mostly positional favoring peripheral etiology.  No focal deficits noted on  exam. Given recent diarrhea will need to assess for electrolyte derangements, renal insufficiency, metabolic derangements. Possibly medicine side effects from Cipro. Will assess for any dysrhythmias. No chest pain or shortness of breath concerning for ACS, PE, etc.  EKG with ectopic bradycardia.  Appears to be similar to prior tracings.  No acute ischemic changes.  No blocks.  Will treat with with antivert and compazine. Will also give IVF. Deferring imaging at this time. May reconsider if no improvement from medication.    Final Clinical Impression(s) / ED Diagnoses Final diagnoses:  None    This chart was dictated using voice recognition software.  Despite best efforts to proofread,  errors can occur which can change the documentation meaning.

## 2023-02-13 NOTE — ED Triage Notes (Addendum)
Per EMS, pt from home c/o dizziness X5 day. She reports her dizziness as "in my ears and behind my head." She believes it is b/c she started Cipro 5 days ago.    Pt returned from [place] 2 weeks ago and began to feel poorly,GI upset and was prescribed Cipro.  Pt saw Cards on Friday and got the "all clear".  Today Dizziness returned and she was also found to be brady in the 50s.    HR 50s BP 176/82 CBG 124 O2 99%  20G L AC

## 2023-02-14 ENCOUNTER — Emergency Department (HOSPITAL_COMMUNITY): Payer: Medicare Other

## 2023-02-14 LAB — COMPREHENSIVE METABOLIC PANEL
ALT: 16 U/L (ref 0–44)
AST: 22 U/L (ref 15–41)
Albumin: 3.9 g/dL (ref 3.5–5.0)
Alkaline Phosphatase: 67 U/L (ref 38–126)
Anion gap: 6 (ref 5–15)
BUN: 27 mg/dL — ABNORMAL HIGH (ref 8–23)
CO2: 24 mmol/L (ref 22–32)
Calcium: 8.7 mg/dL — ABNORMAL LOW (ref 8.9–10.3)
Chloride: 105 mmol/L (ref 98–111)
Creatinine, Ser: 0.69 mg/dL (ref 0.44–1.00)
GFR, Estimated: >60 mL/min (ref >60)
Glucose, Bld: 108 mg/dL — ABNORMAL HIGH (ref 70–99)
Potassium: 3.7 mmol/L (ref 3.5–5.1)
Sodium: 135 mmol/L (ref 135–145)
Total Bilirubin: 0.3 mg/dL (ref 0.3–1.2)
Total Protein: 6.8 g/dL (ref 6.5–8.1)

## 2023-02-14 LAB — MAGNESIUM: Magnesium: 2.4 mg/dL (ref 1.7–2.4)

## 2023-02-14 MED ORDER — ACETAMINOPHEN 500 MG PO TABS
1000.0000 mg | ORAL_TABLET | Freq: Once | ORAL | Status: AC
  Administered 2023-02-14: 1000 mg via ORAL
  Filled 2023-02-14: qty 2

## 2023-02-14 MED ORDER — LORAZEPAM 2 MG/ML IJ SOLN
1.0000 mg | Freq: Once | INTRAMUSCULAR | Status: AC
  Administered 2023-02-14: 1 mg via INTRAVENOUS
  Filled 2023-02-14: qty 1

## 2023-02-14 MED ORDER — DICYCLOMINE HCL 20 MG PO TABS: 20.0000 mg | ORAL_TABLET | Freq: Two times a day (BID) | ORAL | 0 refills | Status: DC

## 2023-02-14 MED ORDER — IOHEXOL 350 MG/ML SOLN
75.0000 mL | Freq: Once | INTRAVENOUS | Status: AC | PRN
  Administered 2023-02-14: 75 mL via INTRAVENOUS

## 2023-02-14 MED ORDER — MORPHINE SULFATE (PF) 4 MG/ML IV SOLN
4.0000 mg | Freq: Once | INTRAVENOUS | Status: AC
  Administered 2023-02-14: 4 mg via INTRAVENOUS
  Filled 2023-02-14: qty 1

## 2023-02-14 MED ORDER — MECLIZINE HCL 25 MG PO TABS: 25.0000 mg | ORAL_TABLET | Freq: Three times a day (TID) | ORAL | 0 refills | Status: DC | PRN

## 2023-02-14 MED ORDER — KETOROLAC TROMETHAMINE 15 MG/ML IJ SOLN
15.0000 mg | Freq: Once | INTRAMUSCULAR | Status: AC
  Administered 2023-02-14: 15 mg via INTRAVENOUS
  Filled 2023-02-14: qty 1

## 2023-02-14 NOTE — ED Provider Notes (Signed)
  Physical Exam  BP (!) 176/86 (BP Location: Left Arm)   Pulse (!) 50   Temp 97.6 F (36.4 C) (Oral)   Resp 18   Ht 5\' 3"  (1.6 m)   Wt 57 kg   SpO2 98%   BMI 22.26 kg/m   Physical Exam Vitals and nursing note reviewed.  Constitutional:      General: She is not in acute distress.    Appearance: She is well-developed.  HENT:     Head: Normocephalic and atraumatic.  Eyes:     Conjunctiva/sclera: Conjunctivae normal.  Cardiovascular:     Rate and Rhythm: Normal rate and regular rhythm.     Heart sounds: No murmur heard. Pulmonary:     Effort: Pulmonary effort is normal. No respiratory distress.     Breath sounds: Normal breath sounds.  Abdominal:     Palpations: Abdomen is soft.     Tenderness: There is no abdominal tenderness.  Musculoskeletal:        General: No swelling.     Cervical back: Neck supple.  Skin:    General: Skin is warm and dry.     Capillary Refill: Capillary refill takes less than 2 seconds.  Neurological:     Mental Status: She is alert.  Psychiatric:        Mood and Affect: Mood normal.     Procedures  Procedures  ED Course / MDM    Medical Decision Making Amount and/or Complexity of Data Reviewed Labs: ordered. Radiology: ordered.  Risk OTC drugs. Prescription drug management.   Patient received in handoff.  Dizziness pending MRI.  MRI reassuringly unremarkable and on reevaluation symptoms have significant proved with previous interventions by previous provider.  Suspect element of ciprofloxacin ototoxicity as underlying cause of peripheral vertigo and patient informed that this medication is contraindicated in the elderly and should avoid this in the future.  Patient given meclizine for home use and patient was discharged.  However, immediately upon getting out of bed, her dizziness returned.  Her daughter states that she gets this way when she has not eaten and that we trialed p.o. challenge and observe the patient for an additional hour.   On additional reevaluation, patient symptoms significant proved and she is able to ambulate without significant difficulty.  Suspect polypharmacy from medications used for dizziness causing patient's previous difficulty with walking.  As these have worn off, she is safe for discharge at this time with outpatient follow-up.       Glendora Score, MD 02/14/23 1026

## 2023-02-14 NOTE — ED Notes (Signed)
Pt to CT

## 2023-02-17 NOTE — Progress Notes (Signed)
[facility] - Clinic Note  02/22/2023    CHIEF COMPLAINT Patient presents for Retina Follow Up  HISTORY OF PRESENT ILLNESS: Kaitlyn Parks is a 88 y.o. female who presents to the clinic today for:   HPI     Retina Follow Up   Patient presents with  Wet AMD.  In both eyes.  This started 7 weeks ago.  Duration of 7 weeks.  Since onset it is stable.  I, the attending physician,  performed the HPI with the patient and updated documentation appropriately.        Comments   7 week retina follow up ARMD and I'VE OS pt is reporting no vision changes noticed she denies any flashes or floaters       Last edited by Rennis Chris, MD on 02/22/2023  4:43 PM.     Pt states left eye vision is down  Referring physician: Karie Georges, MD [address] [place],  [place] [address]  HISTORICAL INFORMATION:  Selected notes from the MEDICAL RECORD NUMBER Referred by Dr. [place] DeMarco for concern of exu ARMD LEE: 11.26.19 (J. 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   No current facility-administered medications for this visit. (Ophthalmic Drugs)   Current Outpatient Medications (Other)  Medication Sig   alum & mag hydroxide-simeth (MAALOX PLUS) 400-400-40 MG/5ML suspension Take 15 mLs by mouth every 6 (six) hours as needed for indigestion.   amLODipine (NORVASC) 2.5 MG tablet Take 1 tablet (2.5 mg total) by mouth daily.   aspirin EC 81 MG tablet Take 81 mg by mouth daily.   Biotin w/ Vitamins C & E (HAIR/SKIN/NAILS PO) Take 1 tablet by mouth daily.   Cranberry 400 MG CAPS Take 400 mg by mouth daily.   dicyclomine (BENTYL) 20 MG tablet Take 1  tablet (20 mg total) by mouth 2 (two) times daily.   hydrALAZINE (APRESOLINE) 10 MG tablet TAKE (1) TABLET TWICE A DAY. MAY TAKE EXTRA DOSE FOR SBP >160 UP TO A TOTAL OF 4 TIMES A DAY. (Patient taking differently: TAKE (1) TABLET TWICE A DAY. MAY TAKE EXTRA DOSE FOR SBP >160 UP TO A TOTAL OF 4 TIMES A DAY. As Needed)   ibuprofen (ADVIL) 400 MG tablet Take 400 mg by mouth every 6 (six) hours as needed for mild pain.   isosorbide mononitrate (IMDUR) 60 MG 24 hr tablet TAKE 1 & 1/2 TABLETS A DAY.   meclizine (ANTIVERT) 25 MG tablet Take 1 tablet (25 mg total) by mouth 3 (three) times daily as needed for dizziness.   Multiple Vitamins-Calcium (ONE-A-DAY WOMENS PO) Take 1 tablet by mouth daily.    nitroGLYCERIN (NITROSTAT) 0.4 MG SL tablet DISSOLVE 1 TABLET UNDER TONGUE IF NEEDED FOR CHEST PAIN. MAY REPEAT IN 5 MINUTES FOR 3 DOSES.   NON FORMULARY Get steroid inject for SI joint every 12 wks with Dr. Smitty Pluck surgery spine   ondansetron (ZOFRAN-ODT) 4 MG disintegrating tablet Take 1 tablet (4 mg total) by mouth every 8 (eight) hours as needed for nausea or vomiting.   PRESCRIPTION MEDICATION Avastin eye injections given by Dr Vanessa Barbara due to macular degeneration . Every 7wks.   rosuvastatin (CRESTOR) 10 MG tablet TABLET ONE-HALF TABLET  BY MOUTH DAILY   SYNTHROID 50 MCG tablet TAKE 1 TABLET EACH DAY. NEED APPOINTMENT FOR ADDITIONAL REFILLS   No current facility-administered medications for this visit. (Other)   Facility-Administered Medications Ordered in Other Visits (Other)  Medication Route   technetium tetrofosmin (TC-MYOVIEW) injection 32.9 millicurie Intravenous   REVIEW OF SYSTEMS: ROS   Positive for: Gastrointestinal, Genitourinary, Musculoskeletal, Cardiovascular, Eyes Negative for: Constitutional, Neurological, Skin, HENT, Endocrine, Respiratory, Psychiatric, Allergic/Imm, Heme/Lymph Last edited by Etheleen Mayhew, COT on 02/22/2023  1:00 PM.     ALLERGIES Allergies   Allergen Reactions   Acyclovir And Related Other (See Comments)    unknown   Ciprofloxacin Other (See Comments)    dizziness   Pravachol [Pravastatin Sodium] Other (See Comments)    cystitis   Sulfa Antibiotics     nausea   Zocor [Simvastatin] Other (See Comments)    cystitis   PAST MEDICAL HISTORY Past Medical History:  Diagnosis Date   Arthritis    DDD, scoliosis, sees Dr. Lovell Sheehan for this, uses norco very rarely for pain   Bradycardia 11/11/2017   CAD (coronary artery disease)    LAD stenting of a 90% lesion 2012   Cystocele    Educated about COVID-19 virus infection 12/06/2019   Elevated cholesterol    GERD (gastroesophageal reflux disease)    dx in work up 2016 for atypical CP at OSH   pt. denies   Heart murmur    Hypertensive retinopathy    OU   Hypothyroidism    Interstitial cystitis    sees Dr. [name]   Macular degeneration    OU   NSTEMI (non-ST elevated myocardial infarction) [facility])    Osteoporosis    Rectocele    S/P hip replacement, right 06/12/2017   Scoliosis    Thyroid disease    Hypothyroid   Urinary incontinence    USI   Uterine prolapse    Past Surgical History:  Procedure Laterality Date   BLADDER SUSPENSION  2011   CATARACT EXTRACTION Bilateral 2015   Dr. Elmer Picker   CORONARY ANGIOPLASTY WITH STENT PLACEMENT  04/21/2010   LAD 80%, RCA 30%, nl EF, s/p DES LAD   EYE SURGERY     LEFT HEART CATH AND CORONARY ANGIOGRAPHY N/A 06/14/2017   Procedure: LEFT HEART CATH AND CORONARY ANGIOGRAPHY;  Surgeon: Runell Gess, MD;  Location: [facility] INVASIVE [facility];  Service: Cardiovascular;  Laterality: N/A;   OOPHORECTOMY  2011   BSO   TOTAL HIP ARTHROPLASTY Right 06/10/2017   Procedure: RIGHT TOTAL HIP ARTHROPLASTY ANTERIOR APPROACH;  Surgeon: Samson Frederic, MD;  Location: [facility] ORS;  Service: Orthopedics;  Laterality: Right;  Needs RNFA   VAGINAL HYSTERECTOMY  2011   LAVH BSO; benign   FAMILY HISTORY Family History  Problem Relation Age of Onset    Hypertension Mother    Heart disease Mother    Heart disease Father    Heart disease Sister    Diabetes Sister    Uterine cancer Sister        mets to lungs   Lung cancer Sister    Scoliosis Sister    Heart disease Brother    Colon cancer Paternal Aunt 29   Breast cancer Paternal Aunt        Age 31's   Breast cancer Paternal Aunt    Leukemia Paternal Aunt    Lung cancer Paternal Grandfather        smoker   Arthritis Daughter    Breast cancer Cousin  Maternal 1st cousins-Age 76's   Esophageal cancer Neg Hx    Pancreatic cancer Neg Hx    Stomach cancer Neg Hx    SOCIAL HISTORY Social History   Tobacco Use   Smoking status: Never   Smokeless tobacco: Never  Vaping Use   Vaping status: Never Used  Substance Use Topics   Alcohol use: No    Comment: Rare   Drug use: No       OPHTHALMIC EXAM: Base Eye Exam     Visual Acuity (Snellen - Linear)       Right Left   Dist [place] 20/40 -2 20/100 -2   Dist ph cc  20/60         Tonometry (Tonopen, 1:06 PM)       Right Left   Pressure 16 16         Pupils       Pupils Dark Light Shape React APD   Right PERRL 3 2 Round Brisk None   Left PERRL 3 2 Round Brisk None         Visual Fields       Left Right    Full Full         Extraocular Movement       Right Left    Full, Ortho Full, Ortho         Neuro/Psych     Oriented x3: Yes   Mood/Affect: Normal         Dilation     Both eyes: 2.5% Phenylephrine @ 1:06 PM           Slit Lamp and Fundus Exam     Slit Lamp Exam       Right Left   Lids/Lashes mild Telangiectasia -- improved, marginal lesion nasal UL Dermatochalasis - upper lid   Conjunctiva/Sclera White and quiet White and quiet, inferior conj chalasis   Cornea Arcus, 2+ fine Punctate epithelial erosions, tear film debris, decreased TBUT arcus, tear film debris, 1-2+ Punctate epithelial erosions   Anterior Chamber Deep and clear Deep and clear   Iris Round and dilated Round  and well dilated   Lens PC IOL in good position, 1+Posterior capsular opacification (linear, extending just to visual axis) PC IOL in excellent position, 2+ Posterior capsular opacification   Anterior Vitreous Vitreous syneresis, Posterior vitreous detachment Vitreous syneresis, Posterior vitreous detachment, vitreous condensations         Fundus Exam       Right Left   Disc Pink and Sharp, Compact, focal PPP Pink and Sharp, mild temporal PPA/PPP   C/D Ratio 0.3 0.3   Macula Blunted foveal reflex, Drusen, RPE mottling and clumping, early Atrophy, +PEDs -- stably improved, trace cystic changes -- stably improved, No frank heme Blunted foveal reflex, +drusen, pigment clumping, RPE mottling, clumping and atrophy, no heme, central PED / CNV with overlying cystic changes -- stably improved, +GA   Vessels attenuated, Tortuous attenuated, Tortuous   Periphery Attached, scattered reticular degeneration   Attached, scattered reticular degeneration           IMAGING AND PROCEDURES  Imaging and Procedures for @TODAY @  OCT, Retina - OU - Both Eyes       Right Eye Quality was good. Central Foveal Thickness: 238. Progression has been stable. Findings include no IRF, no SRF, abnormal foveal contour, retinal drusen , intraretinal hyper-reflective material, pigment epithelial detachment, outer retinal atrophy (stable improvement in cystic changes nasal and temporal fovea, patchy ORA / GA).  Left Eye Quality was good. Central Foveal Thickness: 257. Progression has been stable. Findings include normal foveal contour, no IRF, no SRF, retinal drusen , intraretinal hyper-reflective material, pigment epithelial detachment, outer retinal atrophy (Stable improvement in IRF/cystic changes inferior fovea, stable improvement in [facility] / PED inferior to fovea ).   Notes *Images captured and stored on drive  Diagnosis / Impression:  OD: exudative ARMD -- stable improvement in cystic changes nasal and temporal  fovea, patchy ORA / GA OS: exu-ARMD -- Stable improvement in IRF/cystic changes inferior fovea, stable improvement in [facility] / PED inferior to fovea   Clinical management:  See below  Abbreviations: NFP - Normal foveal profile. CME - cystoid macular edema. PED - pigment epithelial detachment. IRF - intraretinal fluid. SRF - subretinal fluid. EZ - ellipsoid zone. ERM - epiretinal membrane. ORA - outer retinal atrophy. ORT - outer retinal tubulation. SRHM - subretinal hyper-reflective material      Intravitreal Injection, Pharmacologic Agent - OS - Left Eye       Time Out 02/22/2023. 1:46 PM. Confirmed correct patient, procedure, site, and patient consented.   Anesthesia Topical anesthesia was used. Anesthetic medications included Lidocaine 2%, Proparacaine 0.5%.   Procedure Preparation included 5% betadine to ocular surface, eyelid speculum. A (32g) needle was used.   Injection: 2 mg aflibercept 2 MG/0.05ML   Route: Intravitreal, Site: Left Eye   NDC: L6038910, Lot: [id number], Expiration date: 01/31/2024, Waste: 0 mL   Post-op Post injection exam found visual acuity of at least counting fingers. The patient tolerated the procedure well. There were no complications. The patient received written and verbal post procedure care education. Post injection medications were not given.            ASSESSMENT/PLAN:    ICD-10-CM   1. Exudative age-related macular degeneration of left eye with active choroidal neovascularization (HCC)  H35.3221 OCT, Retina - OU - Both Eyes    Intravitreal Injection, Pharmacologic Agent - OS - Left Eye    aflibercept (EYLEA) SOLN 2 mg    2. Exudative age-related macular degeneration of right eye with active choroidal neovascularization (HCC)  H35.3211     3. Essential hypertension  I10     4. Hypertensive retinopathy of both eyes  H35.033     5. Pseudophakia of both eyes  Z96.1     6. Dry eyes  H04.123      1. Exudative age-related macular  degeneration, left eye   - OCT 4.30.2020 showed interval conversion of OS from nonexudative ARMD to exu-ARMD with large dome-shaped PED with overlying IRF/SRF  - FA 05.28.20 -- no active CNV OS, just staining  - s/p IVA OS #1 (04.30.20), #2 (05.28.20), #3 (06.26.20), #4 (08.03.20), #5 (09.11.20), #6 (10.19.20), #7 (11.23.20),  - reactivation of CNV noted on 05.05.22 -- s/p IVA OS  #8 (05.05.22), #9 (06.06.22), #10 (07.19.22), #11 (09.13.22), #12 (10.25.22), #13 (12.13.22), #14 (01.30.23), #15 (3.20.23), #16 (05.08.23), #17 (06.19.23), #18 (08.07.23), #19 (10.02.23), #20 (11.20.23), #21 (01.15.24)  - s/p IVE OS #1 (03.11.24), #2 (04.22.24), #3 (05.29.24)  - **Interval increase in IRF overlying PED at 8 wks on 09.13.22 visit**  - BCVA OS decreased to 20/100 from 20/60   - OCT shows OS: Stable improvement in IRF/cystic changes inferior fovea, interval improvement in [facility] / PED inferior to fovea at 7 wks  - recommend IVE OS #4 today, 07.22.24 with f/u decreased back to 6 weeks   - pt wishes to proceed with injection  - RBA of  procedure discussed, questions answered  - IVE informed consent obtained and signed, 03.11.24  - see procedure note   - benefits investigation for Eylea initiated 4.30.2020 -- approved for 2024  - f/u in 6 wks -- DFE/OCT, possible injection  2. Age related macular degeneration, exudative, OD  - interval development of IRF first noted on 09.11.20 -- conversion from nonexudative to exudative ARMD  - pt initially presented on 11.27.19 due to alert from [employer] home monitoring system for OD  - [facility] prescribed by Dr. Elmer Picker  - FA (5.28.2020) showed staining / window defect corresponding temporal RPE changes OU -- no active CNV OU  - S/P IVA OD #1 (09.11.20), #2 (10.19.20), #3 (11.23.20), #4 (01.07.21), #5 (2.19.21), #6 (04.02.21), #7 (05.28.21), #8 (07.23.21), #9 (09.24.21), #10 (11.29.21), #11 (02.11.22), #12 (05.05.22), #13 (7.19.22), #14 (09.13.22), #15 (10.02.23), #16  (11.20.23), #17 (01.15.24), #18 (03.11.24)  - OCT shows stable improvement in cystic changes nasal and temporal fovea, patchy ORA / GA at 4 mos since last injxn  - BCVA OD stable at 20/40 -- stable   - recommend holding IVA OD today -- will treat PRN - IVA informed consent obtained and signed, 05.08.23 (OU) - see procedure note  - f/u 6 weeks DFE, OCT  3,4. Hypertensive retinopathy OU  - discussed importance of tight BP control  - monitor  5. Pseudophakia OU  - s/p CE/IOL OU  - beautiful surgeries, doing well  - monitor  6. Dry eyes OU  - recommend artificial tears and lubricating ointment as needed  7. PCO OU (OS > OD)  - recommend Yag Cap OS  - will scheduled laser for July 31   Ophthalmic Meds Ordered this visit:  Meds ordered this encounter  Medications   aflibercept (EYLEA) SOLN 2 mg     Return in about 9 days (around 03/03/2023) for f/u yag cap OS, DFE, OCT.  There are no Patient Instructions on file for this visit.  This document serves as a record of services personally performed by Karie Chimera, MD, PhD. It was created on their behalf by De Blanch, an ophthalmic technician. The creation of this record is the provider's dictation and/or activities during the visit.    Electronically signed by: De Blanch, OA, 02/22/23  4:44 PM  This document serves as a record of services personally performed by Karie Chimera, MD, PhD. It was created on their behalf by Glee Arvin. Manson Passey, OA an ophthalmic technician. The creation of this record is the provider's dictation and/or activities during the visit.    Electronically signed by: Glee Arvin. Manson Passey, OA 02/22/23 4:44 PM   Karie Chimera, M.D., Ph.D. Diseases & Surgery of the Retina and Vitreous [facility]  I have reviewed the above documentation for accuracy and completeness, and I agree with the above. Karie Chimera, M.D., Ph.D. 02/22/23 4:46 PM   Abbreviations: M myopia  (nearsighted); A astigmatism; H hyperopia (farsighted); P presbyopia; Mrx spectacle prescription;  CTL contact lenses; OD right eye; OS left eye; OU both eyes  XT exotropia; ET esotropia; PEK punctate epithelial keratitis; PEE punctate epithelial erosions; DES dry eye syndrome; MGD meibomian gland dysfunction; ATs artificial tears; PFAT's preservative free artificial tears; NSC nuclear sclerotic cataract; PSC posterior subcapsular cataract; ERM epi-retinal membrane; PVD posterior vitreous detachment; RD retinal detachment; DM diabetes mellitus; DR diabetic retinopathy; NPDR non-proliferative diabetic retinopathy; PDR proliferative diabetic retinopathy; CSME clinically significant macular edema; DME diabetic macular edema; dbh dot blot hemorrhages; CWS  cotton wool spot; POAG primary open angle glaucoma; C/D cup-to-disc ratio; HVF humphrey visual field; GVF goldmann visual field; OCT optical coherence tomography; IOP intraocular pressure; BRVO Branch retinal vein occlusion; CRVO central retinal vein occlusion; CRAO central retinal artery occlusion; BRAO branch retinal artery occlusion; RT retinal tear; SB scleral buckle; PPV pars plana vitrectomy; VH Vitreous hemorrhage; PRP panretinal laser photocoagulation; IVK intravitreal kenalog; VMT vitreomacular traction; MH Macular hole;  NVD neovascularization of the disc; NVE neovascularization elsewhere; AREDS age related eye disease study; ARMD age related macular degeneration; POAG primary open angle glaucoma; EBMD epithelial/anterior basement membrane dystrophy; ACIOL anterior chamber intraocular lens; IOL intraocular lens; PCIOL posterior chamber intraocular lens; Phaco/IOL phacoemulsification with intraocular lens placement; PRK photorefractive keratectomy; LASIK laser assisted in situ keratomileusis; HTN hypertension; DM diabetes mellitus; COPD chronic obstructive pulmonary disease

## 2023-02-18 ENCOUNTER — Encounter: Payer: Self-pay | Admitting: Family Medicine

## 2023-02-18 ENCOUNTER — Ambulatory Visit: Payer: Medicare Other | Admitting: Family Medicine

## 2023-02-18 VITALS — BP 120/40 | HR 42 | Temp 98.0°F | Ht 63.0 in | Wt 123.0 lb

## 2023-02-18 DIAGNOSIS — H6993 Unspecified Eustachian tube disorder, bilateral: Secondary | ICD-10-CM | POA: Diagnosis not present

## 2023-02-18 DIAGNOSIS — R42 Dizziness and giddiness: Secondary | ICD-10-CM | POA: Diagnosis not present

## 2023-02-18 NOTE — Patient Instructions (Addendum)
Try to "pop" your ears several times a day   Drink lots of water to help stay hydrated  If your symptoms do not improve-- go to ENT, also you might discuss your low heart rate with your cardiologist--- keep in the back of our minds

## 2023-02-18 NOTE — Progress Notes (Signed)
Established Patient Office Visit  Subjective   Patient ID: Kaitlyn Parks, female    DOB: 1934/11/12  Age: 88 y.o. MRN: [id number]  Chief Complaint  Patient presents with   Follow-up    Patient presents tor follow up from ER visit due to dizziness and questionable reaction from Cipro, still complains of recurrent dizziness, better since ER visit    Patient is here for ER follow up. Had an episode of dizziness, was kind of gradual. States that she was given cipro previously since she returned from [place]. Was having diarrhea at the time and she was given the cipro to help with this. Thought that the dizziness might have been caused by the cipro. States that in the ER they told her it might have been from the cipro. She had blood work that showed a elevated BUN, but the rest of her work up was unrevealing. States that she feels like her ears are clogged. She had a very long plan ride on the way back, was in the plane for 10 hours.    Current Outpatient Medications  Medication Instructions   alum & mag hydroxide-simeth (MAALOX PLUS) 400-400-40 MG/5ML suspension 15 mLs, Oral, Every 6 hours PRN   amLODipine (NORVASC) 2.5 mg, Oral, Daily   aspirin EC 81 mg, Oral, Daily   Biotin w/ Vitamins C & E (HAIR/SKIN/NAILS PO) 1 tablet, Oral, Daily   Cranberry 400 mg, Oral, Daily   dicyclomine (BENTYL) 20 mg, Oral, 2 times daily   hydrALAZINE (APRESOLINE) 10 MG tablet TAKE (1) TABLET TWICE A DAY. MAY TAKE EXTRA DOSE FOR SBP >160 UP TO A TOTAL OF 4 TIMES A DAY.   ibuprofen (ADVIL) 400 mg, Oral, Every 6 hours PRN   isosorbide mononitrate (IMDUR) 60 MG 24 hr tablet TAKE 1 & 1/2 TABLETS A DAY.   meclizine (ANTIVERT) 25 mg, Oral, 3 times daily PRN   Multiple Vitamins-Calcium (ONE-A-DAY WOMENS PO) 1 tablet, Oral, Daily   nitroGLYCERIN (NITROSTAT) 0.4 MG SL tablet DISSOLVE 1 TABLET UNDER TONGUE IF NEEDED FOR CHEST PAIN. MAY REPEAT IN 5 MINUTES FOR 3 DOSES.   NON FORMULARY Get steroid inject for SI joint every  12 wks with Dr. Smitty Pluck surgery spine   ondansetron (ZOFRAN-ODT) 4 mg, Oral, Every 8 hours PRN   Polyethyl Glycol-Propyl Glycol (SYSTANE) 0.4-0.3 % GEL ophthalmic gel 1 Application, Both Eyes, Daily PRN   PRESCRIPTION MEDICATION Avastin eye injections given by Dr Vanessa Chritopher Coster due to macular degeneration . Every 7wks.    rosuvastatin (CRESTOR) 10 MG tablet TABLET ONE-HALF TABLET BY MOUTH DAILY   SYNTHROID 50 MCG tablet TAKE 1 TABLET EACH DAY. NEED APPOINTMENT FOR ADDITIONAL REFILLS    Patient Active Problem List   Diagnosis Date Noted   UTI (urinary tract infection) 06/18/2022   Pancreatitis, acute 06/14/2022   Abdominal pain 06/13/2022   Acute pancreatitis 06/13/2022   Hyponatremia 06/13/2022   Gastritis 06/11/2022   Aortic atherosclerosis (HCC) 12/07/2019   Macular degeneration 01/11/2019   Coronary artery disease involving native coronary artery of native heart without angina pectoris 10/29/2018   Presence of intraocular lens 09/23/2018   Bradycardia 11/11/2017   Dyslipidemia 11/11/2017   Pain in joint of right hip 08/04/2017   CAD (coronary artery disease) 06/12/2017   GERD (gastroesophageal reflux disease) 06/12/2017   S/P hip replacement, right 06/12/2017   Osteoarthritis of right hip 06/10/2017   CAD S/P percutaneous coronary angioplasty 02/11/2015   Renal insufficiency 02/11/2015   Prolonged Q-T interval on ECG 04/18/2010  Hypertension 08/11/2007   Hypothyroidism (acquired) 06/08/2007   MITRAL VALVE PROLAPSE 06/08/2007      Review of Systems  All other systems reviewed and are negative.     Objective:     BP (!) 120/40 (BP Location: Left Arm, Patient Position: Sitting, Cuff Size: Normal)   Pulse (!) 42   Temp 98 F (36.7 C) (Oral)   Ht 5\' 3"  (1.6 m)   Wt 123 lb (55.8 kg)   SpO2 98%   BMI 21.79 kg/m    Physical Exam Constitutional:      General: She is not in acute distress.    Appearance: Normal appearance. She is normal weight.  HENT:     Right  Ear: Tympanic membrane normal.     Left Ear: Tympanic membrane normal.     Nose: No congestion.  Cardiovascular:     Rate and Rhythm: Regular rhythm. Bradycardia present.     Pulses: Normal pulses.  Pulmonary:     Effort: Pulmonary effort is normal.  Neurological:     General: No focal deficit present.     Mental Status: She is alert and oriented to person, place, and time. Mental status is at baseline.  Psychiatric:        Mood and Affect: Mood normal.        Behavior: Behavior normal.      No results found for any visits on 02/18/23.    The ASCVD Risk score (Arnett DK, et al., 2019) failed to calculate for the following reasons:   The 2019 ASCVD risk score is only valid for ages 29 to 95    Assessment & Plan:  Eustachian tube dysfunction, bilateral -     Ambulatory referral to ENT  Dizziness   Could have been from multiple things, from the diarrhea causing dehydration (BUN was elevated in ER) or from the cipro she took for the diarrhea. Or it could also be from eustachian tube dysfunction from the extended plane ride. She is gradually getting better, diarrhea did resolve with the cipro. She is off the cipro. Her work up in the ER was relatively benign.   Of note, patient does have underlying chronic bradycardia, in the 40-50 range. This is likely due to long standing HTN. BP recheck in office today was  142/60. Patient diligently checks her BP at home daily and takes PRN hydralazine when it is higher than 160.   I advised that she try to pop her ears several times a day to help with the pressure sensation she is experiencing. I have placed a referral to ENT in case this does not improve. RTC at regular appt or sooner if sx worsen.  No follow-ups on file.    Karie Georges, MD

## 2023-02-22 ENCOUNTER — Telehealth: Payer: Self-pay | Admitting: Family Medicine

## 2023-02-22 ENCOUNTER — Ambulatory Visit (INDEPENDENT_AMBULATORY_CARE_PROVIDER_SITE_OTHER): Payer: Medicare Other | Admitting: Ophthalmology

## 2023-02-22 ENCOUNTER — Encounter (INDEPENDENT_AMBULATORY_CARE_PROVIDER_SITE_OTHER): Payer: Self-pay | Admitting: Ophthalmology

## 2023-02-22 DIAGNOSIS — Z961 Presence of intraocular lens: Secondary | ICD-10-CM

## 2023-02-22 DIAGNOSIS — H35033 Hypertensive retinopathy, bilateral: Secondary | ICD-10-CM | POA: Diagnosis not present

## 2023-02-22 DIAGNOSIS — H353231 Exudative age-related macular degeneration, bilateral, with active choroidal neovascularization: Secondary | ICD-10-CM | POA: Diagnosis not present

## 2023-02-22 DIAGNOSIS — I10 Essential (primary) hypertension: Secondary | ICD-10-CM

## 2023-02-22 DIAGNOSIS — H353211 Exudative age-related macular degeneration, right eye, with active choroidal neovascularization: Secondary | ICD-10-CM

## 2023-02-22 DIAGNOSIS — H04123 Dry eye syndrome of bilateral lacrimal glands: Secondary | ICD-10-CM

## 2023-02-22 DIAGNOSIS — H353221 Exudative age-related macular degeneration, left eye, with active choroidal neovascularization: Secondary | ICD-10-CM

## 2023-02-22 MED ORDER — AFLIBERCEPT 2MG/0.05ML IZ SOLN FOR KALEIDOSCOPE
2.0000 mg | INTRAVITREAL | Status: AC | PRN
Start: 2023-02-22 — End: 2023-02-22
  Administered 2023-02-22: 2 mg via INTRAVITREAL

## 2023-02-22 NOTE — Telephone Encounter (Signed)
Wants to know the date of last blood pressure check.

## 2023-02-22 NOTE — Telephone Encounter (Signed)
I called the patient for clarification of the message below as we do not give information of this kind to an insurance company over the phone.  She stated she had a call from someone earlier asking if she needed some type of service and declined.  Patient is aware this would be noted in the chart.

## 2023-03-01 NOTE — Progress Notes (Signed)
[facility] - Clinic Note  03/03/2023    CHIEF COMPLAINT Patient presents for Retina Follow Up  HISTORY OF PRESENT ILLNESS: Kaitlyn Parks is a 88 y.o. female who presents to the clinic today for:   HPI     Retina Follow Up   In left eye.  This started 9 days ago.  Duration of 9 days.  Since onset it is stable.  I, the attending physician,  performed the HPI with the patient and updated documentation appropriately.        Comments   9 day retina follow up and yag OS pt is reporting no vision changes noticed she denies any flashes or floaters       Last edited by Rennis Chris, MD on 03/03/2023  5:18 PM.    Pt states left eye vision is down  Referring physician: Karie Georges, MD [address] [place],  [place] [address]  HISTORICAL INFORMATION:  Selected notes from the MEDICAL RECORD NUMBER Referred by Dr. [place] DeMarco for concern of exu ARMD LEE: 11.26.19 (J. 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   Polyethyl Glycol-Propyl Glycol (SYSTANE) 0.4-0.3 % GEL ophthalmic gel Place 1 Application into both eyes daily as needed (dry eyes).   No current facility-administered medications for this visit. (Ophthalmic Drugs)   Current Outpatient Medications (Other)  Medication Sig   alum & mag hydroxide-simeth (MAALOX PLUS) 400-400-40 MG/5ML suspension Take 15 mLs by mouth every 6 (six) hours as needed for indigestion.   amLODipine (NORVASC) 2.5 MG tablet Take 1 tablet (2.5 mg total) by mouth daily.   aspirin EC 81 MG tablet Take 81 mg by mouth daily.   Biotin w/ Vitamins C & E (HAIR/SKIN/NAILS PO) Take 1 tablet by mouth daily.   Cranberry  400 MG CAPS Take 400 mg by mouth daily.   dicyclomine (BENTYL) 20 MG tablet Take 1 tablet (20 mg total) by mouth 2 (two) times daily.   hydrALAZINE (APRESOLINE) 10 MG tablet TAKE (1) TABLET TWICE A DAY. MAY TAKE EXTRA DOSE FOR SBP >160 UP TO A TOTAL OF 4 TIMES A DAY. (Patient taking differently: TAKE (1) TABLET TWICE A DAY. MAY TAKE EXTRA DOSE FOR SBP >160 UP TO A TOTAL OF 4 TIMES A DAY. As Needed)   ibuprofen (ADVIL) 400 MG tablet Take 400 mg by mouth every 6 (six) hours as needed for mild pain.   isosorbide mononitrate (IMDUR) 60 MG 24 hr tablet TAKE 1 & 1/2 TABLETS A DAY.   meclizine (ANTIVERT) 25 MG tablet Take 1 tablet (25 mg total) by mouth 3 (three) times daily as needed for dizziness.   Multiple Vitamins-Calcium (ONE-A-DAY WOMENS PO) Take 1 tablet by mouth daily.    nitroGLYCERIN (NITROSTAT) 0.4 MG SL tablet DISSOLVE 1 TABLET UNDER TONGUE IF NEEDED FOR CHEST PAIN. MAY REPEAT IN 5 MINUTES FOR 3 DOSES.   NON FORMULARY Get steroid inject for SI joint every 12 wks with Dr. Smitty Pluck surgery spine   ondansetron (ZOFRAN-ODT) 4 MG disintegrating tablet Take 1 tablet (4 mg total) by mouth every 8 (eight) hours as needed for nausea or vomiting.   PRESCRIPTION MEDICATION Avastin eye injections given by Dr Vanessa Barbara due to  macular degeneration . Every 7wks.   rosuvastatin (CRESTOR) 10 MG tablet TABLET ONE-HALF TABLET BY MOUTH DAILY   SYNTHROID 50 MCG tablet TAKE 1 TABLET EACH DAY. NEED APPOINTMENT FOR ADDITIONAL REFILLS   No current facility-administered medications for this visit. (Other)   Facility-Administered Medications Ordered in Other Visits (Other)  Medication Route   technetium tetrofosmin (TC-MYOVIEW) injection 32.9 millicurie Intravenous   REVIEW OF SYSTEMS: ROS   Positive for: Gastrointestinal, Genitourinary, Musculoskeletal, Cardiovascular, Eyes Negative for: Constitutional, Neurological, Skin, HENT, Endocrine, Respiratory, Psychiatric, Allergic/Imm, Heme/Lymph Last edited by  Etheleen Mayhew, COT on 03/03/2023  1:16 PM.      ALLERGIES Allergies  Allergen Reactions   Acyclovir And Related Other (See Comments)    unknown   Ciprofloxacin Other (See Comments)    dizziness   Pravachol [Pravastatin Sodium] Other (See Comments)    cystitis   Sulfa Antibiotics     nausea   Zocor [Simvastatin] Other (See Comments)    cystitis   PAST MEDICAL HISTORY Past Medical History:  Diagnosis Date   Arthritis    DDD, scoliosis, sees Dr. Lovell Sheehan for this, uses norco very rarely for pain   Bradycardia 11/11/2017   CAD (coronary artery disease)    LAD stenting of a 90% lesion 2012   Cystocele    Educated about COVID-19 virus infection 12/06/2019   Elevated cholesterol    GERD (gastroesophageal reflux disease)    dx in work up 2016 for atypical CP at OSH   pt. denies   Heart murmur    Hypertensive retinopathy    OU   Hypothyroidism    Interstitial cystitis    sees Dr. [name]   Macular degeneration    OU   NSTEMI (non-ST elevated myocardial infarction) [facility])    Osteoporosis    Rectocele    S/P hip replacement, right 06/12/2017   Scoliosis    Thyroid disease    Hypothyroid   Urinary incontinence    USI   Uterine prolapse    Past Surgical History:  Procedure Laterality Date   BLADDER SUSPENSION  2011   CATARACT EXTRACTION Bilateral 2015   Dr. Elmer Picker   CORONARY ANGIOPLASTY WITH STENT PLACEMENT  04/21/2010   LAD 80%, RCA 30%, nl EF, s/p DES LAD   EYE SURGERY     LEFT HEART CATH AND CORONARY ANGIOGRAPHY N/A 06/14/2017   Procedure: LEFT HEART CATH AND CORONARY ANGIOGRAPHY;  Surgeon: Runell Gess, MD;  Location: [facility] INVASIVE CV LAB;  Service: Cardiovascular;  Laterality: N/A;   OOPHORECTOMY  2011   BSO   TOTAL HIP ARTHROPLASTY Right 06/10/2017   Procedure: RIGHT TOTAL HIP ARTHROPLASTY ANTERIOR APPROACH;  Surgeon: Samson Frederic, MD;  Location: [facility] ORS;  Service: Orthopedics;  Laterality: Right;  Needs RNFA   VAGINAL HYSTERECTOMY  2011   LAVH BSO;  benign   FAMILY HISTORY Family History  Problem Relation Age of Onset   Hypertension Mother    Heart disease Mother    Heart disease Father    Heart disease Sister    Diabetes Sister    Uterine cancer Sister        mets to lungs   Lung cancer Sister    Scoliosis Sister    Heart disease Brother    Colon cancer Paternal Aunt 13   Breast cancer Paternal Aunt        Age 78's   Breast cancer Paternal Aunt    Leukemia Paternal Aunt    Lung cancer Paternal Grandfather  smoker   Arthritis Daughter    Breast cancer Cousin        Maternal 1st cousins-Age 61's   Esophageal cancer Neg Hx    Pancreatic cancer Neg Hx    Stomach cancer Neg Hx    SOCIAL HISTORY Social History   Tobacco Use   Smoking status: Never   Smokeless tobacco: Never  Vaping Use   Vaping status: Never Used  Substance Use Topics   Alcohol use: No    Comment: Rare   Drug use: No       OPHTHALMIC EXAM: Base Eye Exam     Visual Acuity (Snellen - Linear)       Right Left   Dist [place] 20/40 20/100   Dist ph [place] NI 20/70         Tonometry (Tonopen, 1:19 PM)       Right Left   Pressure 15 16         Pupils       Pupils Dark Light Shape React APD   Right PERRL 3 2 Round Brisk None   Left PERRL 3 2 Round Brisk None         Visual Fields       Left Right    Full Full         Extraocular Movement       Right Left    Full, Ortho Full, Ortho         Neuro/Psych     Oriented x3: Yes   Mood/Affect: Normal         Dilation     Left eye: 2.5% Phenylephrine @ 1:20 PM           Slit Lamp and Fundus Exam     Slit Lamp Exam       Right Left   Lids/Lashes mild Telangiectasia -- improved, marginal lesion nasal UL Dermatochalasis - upper lid   Conjunctiva/Sclera White and quiet White and quiet, inferior conj chalasis   Cornea Arcus, 2+ fine Punctate epithelial erosions, tear film debris, decreased TBUT arcus, tear film debris, 1-2+ Punctate epithelial erosions    Anterior Chamber Deep and clear Deep and clear   Iris Round and dilated Round and well dilated   Lens PC IOL in good position, 1+Posterior capsular opacification (linear, extending just to visual axis) PC IOL in excellent position, 2+ Posterior capsular opacification   Anterior Vitreous Vitreous syneresis, Posterior vitreous detachment Vitreous syneresis, Posterior vitreous detachment, vitreous condensations         Fundus Exam       Right Left   Disc Pink and Sharp, Compact, focal PPP Pink and Sharp, mild temporal PPA/PPP   C/D Ratio 0.3 0.3   Macula Blunted foveal reflex, Drusen, RPE mottling and clumping, early Atrophy, +PEDs -- stably improved, trace cystic changes -- stably improved, No frank heme Blunted foveal reflex, +drusen, pigment clumping, RPE mottling, clumping and atrophy, no heme, central PED / CNV with overlying cystic changes -- stably improved, +GA   Vessels attenuated, Tortuous attenuated, Tortuous   Periphery Attached, scattered reticular degeneration   Attached, scattered reticular degeneration           IMAGING AND PROCEDURES  Imaging and Procedures for @TODAY @  OCT, Retina - OU - Both Eyes       Right Eye Quality was good. Central Foveal Thickness: 243. Progression has been stable. Findings include no IRF, no SRF, abnormal foveal contour, retinal drusen , intraretinal hyper-reflective material, pigment epithelial detachment,  outer retinal atrophy (stable improvement in cystic changes nasal and temporal fovea, patchy ORA / GA).   Left Eye Quality was good. Central Foveal Thickness: 254. Progression has been stable. Findings include normal foveal contour, no IRF, no SRF, retinal drusen , intraretinal hyper-reflective material, pigment epithelial detachment, outer retinal atrophy (Stable improvement in IRF/cystic changes inferior fovea, stable improvement in [facility] / PED inferior to fovea ).   Notes *Images captured and stored on drive  Diagnosis / Impression:   OD: exudative ARMD -- stable improvement in cystic changes nasal and temporal fovea, patchy ORA / GA OS: exu-ARMD -- Stable improvement in IRF/cystic changes inferior fovea, stable improvement in [facility] / PED inferior to fovea   Clinical management:  See below  Abbreviations: NFP - Normal foveal profile. CME - cystoid macular edema. PED - pigment epithelial detachment. IRF - intraretinal fluid. SRF - subretinal fluid. EZ - ellipsoid zone. ERM - epiretinal membrane. ORA - outer retinal atrophy. ORT - outer retinal tubulation. SRHM - subretinal hyper-reflective material      Yag Capsulotomy - OS - Left Eye       Procedure note: YAG Capsulotomy, LEFT Eye  Informed consent obtained. Pre-op dilating drops (1% Topicamide and 2.5% Phenylephrine), and topical anesthesia given. Power: 7.0 mJ Shots: 10 Posterior capsulotomy in cruciate formation performed without difficulty. Patient tolerated procedure well. No complications. Rx pred forte 4 times a day for 7 days, then stop. Pt received written and verbal post laser education. Recheck in 2-3 weeks w/ dilated exam.            ASSESSMENT/PLAN:    ICD-10-CM   1. Exudative age-related macular degeneration of left eye with active choroidal neovascularization (HCC)  H35.3221 OCT, Retina - OU - Both Eyes    2. Exudative age-related macular degeneration of right eye with active choroidal neovascularization (HCC)  H35.3211     3. Essential hypertension  I10     4. Hypertensive retinopathy of both eyes  H35.033     5. Pseudophakia of both eyes  Z96.1     6. Dry eyes  H04.123     7. Left posterior capsular opacification  H26.492 Yag Capsulotomy - OS - Left Eye     1. Exudative age-related macular degeneration, left eye   - OCT 4.30.2020 showed interval conversion of OS from nonexudative ARMD to exu-ARMD with large dome-shaped PED with overlying IRF/SRF  - FA 05.28.20 -- no active CNV OS, just staining  - s/p IVA OS #1 (04.30.20),  #2 (05.28.20), #3 (06.26.20), #4 (08.03.20), #5 (09.11.20), #6 (10.19.20), #7 (11.23.20),  - reactivation of CNV noted on 05.05.22 -- s/p IVA OS  #8 (05.05.22), #9 (06.06.22), #10 (07.19.22), #11 (09.13.22), #12 (10.25.22), #13 (12.13.22), #14 (01.30.23), #15 (3.20.23), #16 (05.08.23), #17 (06.19.23), #18 (08.07.23), #19 (10.02.23), #20 (11.20.23), #21 (01.15.24)  - s/p IVE OS #1 (03.11.24), #2 (04.22.24), #3 (05.29.24), #4 (07.22.24)  - **Interval increase in IRF overlying PED at 8 wks on 09.13.22 visit**  - BCVA OS 20/70   - OCT shows OS: Stable improvement in IRF/cystic changes inferior fovea, interval improvement in [facility] / PED inferior to fovea at 7 wks  - IVE informed consent obtained and signed, 03.11.24  - benefits investigation for [employer] initiated 4.30.2020 -- approved for 2024  - f/u in 6 wks from 07.22.24 -- 09.02.24 or later -- DFE/OCT, possible injection  2. Age related macular degeneration, exudative, OD  - interval development of IRF first noted on 09.11.20 -- conversion from nonexudative to  exudative ARMD  - pt initially presented on 11.27.19 due to alert from [facility] home monitoring system for OD  - [facility] prescribed by Dr. Elmer Picker  - FA (5.28.2020) showed staining / window defect corresponding temporal RPE changes OU -- no active CNV OU  - S/P IVA OD #1 (09.11.20), #2 (10.19.20), #3 (11.23.20), #4 (01.07.21), #5 (2.19.21), #6 (04.02.21), #7 (05.28.21), #8 (07.23.21), #9 (09.24.21), #10 (11.29.21), #11 (02.11.22), #12 (05.05.22), #13 (7.19.22), #14 (09.13.22), #15 (10.02.23), #16 (11.20.23), #17 (01.15.24), #18 (03.11.24)  - OCT shows stable improvement in cystic changes nasal and temporal fovea, patchy ORA / GA at 4+ mos since last injxn  - BCVA OD stable at 20/40  - recommend holding IVA OD today -- will treat PRN - IVA informed consent obtained and signed, 05.08.23 (OU) - see procedure note  - f/u 6 weeks DFE, OCT  3,4. Hypertensive retinopathy OU  - discussed importance of  tight BP control  - monitor  5. Pseudophakia OU  - s/p CE/IOL OU  - beautiful surgeries, doing well  - monitor  6. Dry eyes OU  - recommend artificial tears and lubricating ointment as needed  7. PCO OU (OS > OD)  - recommend Yag Cap OS today, 07.31.24  - RBA of procedure discussed, questions answered - informed consent obtained and signed - see procedure note  - start PF QID OS x 7 days     Ophthalmic Meds Ordered this visit:  Meds ordered this encounter  Medications   prednisoLONE acetate (PRED FORTE) 1 % ophthalmic suspension    Sig: Place 1 drop into the left eye 4 (four) times daily for 7 days.    Dispense:  1.4 mL    Refill:  0     Return for 6wk f/u on 9/2 or later exu ARMD OS.  There are no Patient Instructions on file for this visit.  This document serves as a record of services personally performed by Karie Chimera, MD, PhD. It was created on their behalf by De Blanch, an ophthalmic technician. The creation of this record is the provider's dictation and/or activities during the visit.    Electronically signed by: De Blanch, OA, 03/03/23  5:28 PM  Karie Chimera, M.D., Ph.D. Diseases & Surgery of the Retina and Vitreous [facility]  I have reviewed the above documentation for accuracy and completeness, and I agree with the above. Karie Chimera, M.D., Ph.D. 03/03/23 5:29 PM   Abbreviations: M myopia (nearsighted); A astigmatism; H hyperopia (farsighted); P presbyopia; Mrx spectacle prescription;  CTL contact lenses; OD right eye; OS left eye; OU both eyes  XT exotropia; ET esotropia; PEK punctate epithelial keratitis; PEE punctate epithelial erosions; DES dry eye syndrome; MGD meibomian gland dysfunction; ATs artificial tears; PFAT's preservative free artificial tears; NSC nuclear sclerotic cataract; PSC posterior subcapsular cataract; ERM epi-retinal membrane; PVD posterior vitreous detachment; RD retinal detachment; DM  diabetes mellitus; DR diabetic retinopathy; NPDR non-proliferative diabetic retinopathy; PDR proliferative diabetic retinopathy; CSME clinically significant macular edema; DME diabetic macular edema; dbh dot blot hemorrhages; CWS cotton wool spot; POAG primary open angle glaucoma; C/D cup-to-disc ratio; HVF humphrey visual field; GVF goldmann visual field; OCT optical coherence tomography; IOP intraocular pressure; BRVO Branch retinal vein occlusion; CRVO central retinal vein occlusion; CRAO central retinal artery occlusion; BRAO branch retinal artery occlusion; RT retinal tear; SB scleral buckle; PPV pars plana vitrectomy; VH Vitreous hemorrhage; PRP panretinal laser photocoagulation; IVK intravitreal kenalog; VMT vitreomacular traction; MH Macular hole;  NVD neovascularization of the disc; NVE neovascularization elsewhere; AREDS age related eye disease study; ARMD age related macular degeneration; POAG primary open angle glaucoma; EBMD epithelial/anterior basement membrane dystrophy; ACIOL anterior chamber intraocular lens; IOL intraocular lens; PCIOL posterior chamber intraocular lens; Phaco/IOL phacoemulsification with intraocular lens placement; PRK photorefractive keratectomy; LASIK laser assisted in situ keratomileusis; HTN hypertension; DM diabetes mellitus; COPD chronic obstructive pulmonary disease

## 2023-03-03 ENCOUNTER — Ambulatory Visit (INDEPENDENT_AMBULATORY_CARE_PROVIDER_SITE_OTHER): Payer: Medicare Other | Admitting: Ophthalmology

## 2023-03-03 ENCOUNTER — Encounter (INDEPENDENT_AMBULATORY_CARE_PROVIDER_SITE_OTHER): Payer: Self-pay | Admitting: Ophthalmology

## 2023-03-03 DIAGNOSIS — Z961 Presence of intraocular lens: Secondary | ICD-10-CM

## 2023-03-03 DIAGNOSIS — H353231 Exudative age-related macular degeneration, bilateral, with active choroidal neovascularization: Secondary | ICD-10-CM

## 2023-03-03 DIAGNOSIS — I10 Essential (primary) hypertension: Secondary | ICD-10-CM

## 2023-03-03 DIAGNOSIS — H35033 Hypertensive retinopathy, bilateral: Secondary | ICD-10-CM

## 2023-03-03 DIAGNOSIS — H353211 Exudative age-related macular degeneration, right eye, with active choroidal neovascularization: Secondary | ICD-10-CM

## 2023-03-03 DIAGNOSIS — H04123 Dry eye syndrome of bilateral lacrimal glands: Secondary | ICD-10-CM

## 2023-03-03 DIAGNOSIS — H353221 Exudative age-related macular degeneration, left eye, with active choroidal neovascularization: Secondary | ICD-10-CM

## 2023-03-03 DIAGNOSIS — H353112 Nonexudative age-related macular degeneration, right eye, intermediate dry stage: Secondary | ICD-10-CM

## 2023-03-03 DIAGNOSIS — H26492 Other secondary cataract, left eye: Secondary | ICD-10-CM

## 2023-03-03 MED ORDER — PREDNISOLONE ACETATE 1 % OP SUSP: 1.0000 [drp] | Freq: Four times a day (QID) | OPHTHALMIC | 0 refills | Status: AC

## 2023-03-05 ENCOUNTER — Other Ambulatory Visit: Payer: Self-pay | Admitting: Cardiology

## 2023-04-09 NOTE — Progress Notes (Signed)
[facility] - Clinic Note  04/12/2023    CHIEF COMPLAINT Patient presents for Retina Follow Up  HISTORY OF PRESENT ILLNESS: Kaitlyn Parks is a 88 y.o. female who presents to the clinic today for:   HPI     Retina Follow Up   Patient presents with  Wet AMD.  In left eye.  This started 6 weeks ago.  Duration of 6 weeks.  Since onset it is stable.  I, the attending physician,  performed the HPI with the patient and updated documentation appropriately.        Comments   6 week retina follow up ARMD and IVE OS pt is reporting vision seems better after having yad in July she has some floaters after injection denies flashes       Last edited by Rennis Chris, MD on 04/12/2023  6:33 PM.    Pt states the Yag helped her vision, she states she was using Meibo, but the cost has increased from $100 to $183 a month  Referring physician: Karie Georges, MD [address] [place],  [place] [address]  HISTORICAL INFORMATION:  Selected notes from the MEDICAL RECORD NUMBER Referred by Dr. [place] DeMarco for concern of exu ARMD LEE: 11.26.19 (J. 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   No current facility-administered medications for this visit. (Ophthalmic Drugs)   Current Outpatient Medications (Other)  Medication Sig   alum & mag hydroxide-simeth (MAALOX PLUS) 400-400-40 MG/5ML suspension Take 15 mLs by mouth every 6 (six) hours as needed for indigestion.   amLODipine (NORVASC) 2.5 MG tablet Take 1 tablet (2.5 mg total) by mouth daily.   aspirin EC 81 MG tablet Take 81 mg by mouth daily.   Biotin w/ Vitamins C & E (HAIR/SKIN/NAILS PO) Take 1  tablet by mouth daily.   Cranberry 400 MG CAPS Take 400 mg by mouth daily.   dicyclomine (BENTYL) 20 MG tablet Take 1 tablet (20 mg total) by mouth 2 (two) times daily.   hydrALAZINE (APRESOLINE) 10 MG tablet TAKE (1) TABLET TWICE A DAY. MAY TAKE EXTRA DOSE FOR SBP >160 UP TO A TOTAL OF 4 TIMES A DAY. (Patient taking differently: TAKE (1) TABLET TWICE A DAY. MAY TAKE EXTRA DOSE FOR SBP >160 UP TO A TOTAL OF 4 TIMES A DAY. As Needed)   ibuprofen (ADVIL) 400 MG tablet Take 400 mg by mouth every 6 (six) hours as needed for mild pain.   isosorbide mononitrate (IMDUR) 60 MG 24 hr tablet TAKE 1 & 1/2 TABLETS A DAY.   meclizine (ANTIVERT) 25 MG tablet Take 1 tablet (25 mg total) by mouth 3 (three) times daily as needed for dizziness.   Multiple Vitamins-Calcium (ONE-A-DAY WOMENS PO) Take 1 tablet by mouth daily.    nitroGLYCERIN (NITROSTAT) 0.4 MG SL tablet DISSOLVE 1 TABLET UNDER TONGUE IF NEEDED FOR CHEST PAIN. MAY REPEAT IN 5 MINUTES FOR 3 DOSES.   NON FORMULARY Get steroid inject for SI joint every 12 wks with Dr. Smitty Pluck surgery spine   ondansetron (ZOFRAN-ODT) 4 MG disintegrating tablet Take 1 tablet (4 mg total) by mouth every 8 (eight) hours as needed for nausea or vomiting.   PRESCRIPTION MEDICATION Avastin eye  injections given by Dr Vanessa Barbara due to macular degeneration . Every 7wks.   rosuvastatin (CRESTOR) 10 MG tablet TABLET ONE-HALF TABLET BY MOUTH DAILY   SYNTHROID 50 MCG tablet TAKE 1 TABLET EACH DAY. NEED APPOINTMENT FOR ADDITIONAL REFILLS   No current facility-administered medications for this visit. (Other)   Facility-Administered Medications Ordered in Other Visits (Other)  Medication Route   technetium tetrofosmin (TC-MYOVIEW) injection 32.9 millicurie Intravenous   REVIEW OF SYSTEMS: ROS   Positive for: Gastrointestinal, Genitourinary, Musculoskeletal, Cardiovascular, Eyes Negative for: Constitutional, Neurological, Skin, HENT, Endocrine, Respiratory, Psychiatric,  Allergic/Imm, Heme/Lymph Last edited by Etheleen Mayhew, COT on 04/12/2023  1:28 PM.       ALLERGIES Allergies  Allergen Reactions   Acyclovir And Related Other (See Comments)    unknown   Ciprofloxacin Other (See Comments)    dizziness   Pravachol [Pravastatin Sodium] Other (See Comments)    cystitis   Sulfa Antibiotics     nausea   Zocor [Simvastatin] Other (See Comments)    cystitis   PAST MEDICAL HISTORY Past Medical History:  Diagnosis Date   Arthritis    DDD, scoliosis, sees Dr. Lovell Sheehan for this, uses norco very rarely for pain   Bradycardia 11/11/2017   CAD (coronary artery disease)    LAD stenting of a 90% lesion 2012   Cystocele    Educated about COVID-19 virus infection 12/06/2019   Elevated cholesterol    GERD (gastroesophageal reflux disease)    dx in work up 2016 for atypical CP at OSH   pt. denies   Heart murmur    Hypertensive retinopathy    OU   Hypothyroidism    Interstitial cystitis    sees Dr. [name]   Macular degeneration    OU   NSTEMI (non-ST elevated myocardial infarction) [facility])    Osteoporosis    Rectocele    S/P hip replacement, right 06/12/2017   Scoliosis    Thyroid disease    Hypothyroid   Urinary incontinence    USI   Uterine prolapse    Past Surgical History:  Procedure Laterality Date   BLADDER SUSPENSION  2011   CATARACT EXTRACTION Bilateral 2015   Dr. Elmer Picker   CORONARY ANGIOPLASTY WITH STENT PLACEMENT  04/21/2010   LAD 80%, RCA 30%, nl EF, s/p DES LAD   EYE SURGERY     LEFT HEART CATH AND CORONARY ANGIOGRAPHY N/A 06/14/2017   Procedure: LEFT HEART CATH AND CORONARY ANGIOGRAPHY;  Surgeon: Runell Gess, MD;  Location: [facility] INVASIVE CV LAB;  Service: Cardiovascular;  Laterality: N/A;   OOPHORECTOMY  2011   BSO   TOTAL HIP ARTHROPLASTY Right 06/10/2017   Procedure: RIGHT TOTAL HIP ARTHROPLASTY ANTERIOR APPROACH;  Surgeon: Samson Frederic, MD;  Location: [facility] ORS;  Service: Orthopedics;  Laterality: Right;  Needs RNFA    VAGINAL HYSTERECTOMY  2011   LAVH BSO; benign   FAMILY HISTORY Family History  Problem Relation Age of Onset   Hypertension Mother    Heart disease Mother    Heart disease Father    Heart disease Sister    Diabetes Sister    Uterine cancer Sister        mets to lungs   Lung cancer Sister    Scoliosis Sister    Heart disease Brother    Colon cancer Paternal Aunt 68   Breast cancer Paternal Aunt        Age 40's   Breast cancer Paternal Aunt    Leukemia Paternal Aunt  Lung cancer Paternal Grandfather        smoker   Arthritis Daughter    Breast cancer Cousin        Maternal 1st cousins-Age 69's   Esophageal cancer Neg Hx    Pancreatic cancer Neg Hx    Stomach cancer Neg Hx    SOCIAL HISTORY Social History   Tobacco Use   Smoking status: Never   Smokeless tobacco: Never  Vaping Use   Vaping status: Never Used  Substance Use Topics   Alcohol use: No    Comment: Rare   Drug use: No       OPHTHALMIC EXAM: Base Eye Exam     Visual Acuity (Snellen - Linear)       Right Left   Dist [place] 20/40 20/40 -3   Dist ph [place] NI 20/40 -2         Tonometry (Tonopen, 1:34 PM)       Right Left   Pressure 16 16         Pupils       Pupils Dark Light Shape React APD   Right PERRL 3 2 Round Brisk None   Left PERRL 3 2 Round Brisk None         Visual Fields       Left Right    Full Full         Extraocular Movement       Right Left    Full, Ortho Full, Ortho         Neuro/Psych     Oriented x3: Yes   Mood/Affect: Normal         Dilation     Both eyes: 2.5% Phenylephrine @ 1:34 PM           Slit Lamp and Fundus Exam     Slit Lamp Exam       Right Left   Lids/Lashes mild Telangiectasia -- improved, marginal lesion nasal UL Dermatochalasis - upper lid   Conjunctiva/Sclera White and quiet White and quiet, inferior conj chalasis   Cornea Arcus, 2+ fine Punctate epithelial erosions, tear film debris, decreased TBUT arcus, tear film  debris, 2+ inferior Punctate epithelial erosions   Anterior Chamber Deep and clear Deep and clear   Iris Round and dilated Round and well dilated   Lens PC IOL in good position, 1+Posterior capsular opacification IN PC IOL in good position with open PC   Anterior Vitreous Vitreous syneresis, Posterior vitreous detachment Vitreous syneresis, Posterior vitreous detachment, vitreous condensations         Fundus Exam       Right Left   Disc Pink and Sharp, Compact, focal PPP Pink and Sharp, mild temporal PPA/PPP   C/D Ratio 0.3 0.3   Macula Blunted foveal reflex, Drusen, RPE mottling and clumping, early Atrophy, +PEDs -- stably improved, trace cystic changes -- stably improved, No frank heme Blunted foveal reflex, +drusen, pigment clumping, RPE mottling, clumping and atrophy, no heme, central PED / CNV with overlying cystic changes -- stably improved, +GA   Vessels attenuated, Tortuous attenuated, Tortuous   Periphery Attached, scattered reticular degeneration   Attached, scattered reticular degeneration           IMAGING AND PROCEDURES  Imaging and Procedures for @TODAY @  OCT, Retina - OU - Both Eyes       Right Eye Quality was good. Central Foveal Thickness: 240. Progression has been stable. Findings include no IRF, no SRF, abnormal foveal contour, retinal  drusen , intraretinal hyper-reflective material, pigment epithelial detachment, outer retinal atrophy (stable improvement in cystic changes nasal and temporal fovea, patchy ORA / GA -- no fluid).   Left Eye Quality was good. Central Foveal Thickness: 263. Progression has been stable. Findings include normal foveal contour, no IRF, no SRF, retinal drusen , intraretinal hyper-reflective material, pigment epithelial detachment, outer retinal atrophy (Stable improvement in IRF/cystic changes inferior fovea, stable improvement in [facility] / PED inferior to fovea ).   Notes *Images captured and stored on drive  Diagnosis / Impression:   OD: exudative ARMD -- stable improvement in cystic changes nasal and temporal fovea, patchy ORA / GA -- no fluid OS: exu-ARMD -- Stable improvement in IRF/cystic changes inferior fovea, stable improvement in [facility] / PED inferior to fovea   Clinical management:  See below  Abbreviations: NFP - Normal foveal profile. CME - cystoid macular edema. PED - pigment epithelial detachment. IRF - intraretinal fluid. SRF - subretinal fluid. EZ - ellipsoid zone. ERM - epiretinal membrane. ORA - outer retinal atrophy. ORT - outer retinal tubulation. SRHM - subretinal hyper-reflective material      Intravitreal Injection, Pharmacologic Agent - OS - Left Eye       Time Out 04/12/2023. 2:33 PM. Confirmed correct patient, procedure, site, and patient consented.   Anesthesia Topical anesthesia was used. Anesthetic medications included Lidocaine 2%, Proparacaine 0.5%.   Procedure Preparation included 5% betadine to ocular surface, eyelid speculum. A (32g) needle was used.   Injection: 2 mg aflibercept 2 MG/0.05ML   Route: Intravitreal, Site: Left Eye   NDC: L6038910, Lot: [id number], Expiration date: 07/02/2024, Waste: 0 mL   Post-op Post injection exam found visual acuity of at least counting fingers. The patient tolerated the procedure well. There were no complications. The patient received written and verbal post procedure care education. Post injection medications were not given.            ASSESSMENT/PLAN:    ICD-10-CM   1. Exudative age-related macular degeneration of left eye with active choroidal neovascularization (HCC)  H35.3221 OCT, Retina - OU - Both Eyes    Intravitreal Injection, Pharmacologic Agent - OS - Left Eye    aflibercept (EYLEA) SOLN 2 mg    2. Exudative age-related macular degeneration of right eye with active choroidal neovascularization (HCC)  H35.3211     3. Essential hypertension  I10     4. Hypertensive retinopathy of both eyes  H35.033     5. Pseudophakia  of both eyes  Z96.1     6. Dry eyes  H04.123     7. Left posterior capsular opacification  H26.492       1. Exudative age-related macular degeneration, left eye   - OCT 4.30.2020 showed interval conversion of OS from nonexudative ARMD to exu-ARMD with large dome-shaped PED with overlying IRF/SRF  - FA 05.28.20 -- no active CNV OS, just staining  - s/p IVA OS #1 (04.30.20), #2 (05.28.20), #3 (06.26.20), #4 (08.03.20), #5 (09.11.20), #6 (10.19.20), #7 (11.23.20),  - reactivation of CNV noted on 05.05.22 -- s/p IVA OS  #8 (05.05.22), #9 (06.06.22), #10 (07.19.22), #11 (09.13.22), #12 (10.25.22), #13 (12.13.22), #14 (01.30.23), #15 (3.20.23), #16 (05.08.23), #17 (06.19.23), #18 (08.07.23), #19 (10.02.23), #20 (11.20.23), #21 (01.15.24)  - s/p IVE OS #1 (03.11.24), #2 (04.22.24), #3 (05.29.24), #4 (07.22.24)  - **Interval increase in IRF overlying PED at 8 wks on 09.13.22 visit**  - BCVA OS 20/40 from 20/70 -- improved post-YAG  - OCT shows OS: Stable  improvement in IRF/cystic changes inferior fovea, interval improvement in [facility] / PED inferior to fovea at 7 wks  - recommend IVE OS #5 today, 09.09.24 with follow up in 7 weeks again  - pt wishes to proceed with injection  - RBA of procedure discussed, questions answered - see procedure note - IVE informed consent obtained and signed, 03.11.24  - benefits investigation for Eylea initiated 4.30.2020 -- approved for 2024  - f/u in 7 weeks -- DFE/OCT, possible injection  2. Age related macular degeneration, exudative, OD  - interval development of IRF first noted on 09.11.20 -- conversion from nonexudative to exudative ARMD  - pt initially presented on 11.27.19 due to alert from [employer] home monitoring system for OD  - [facility] prescribed by Dr. Elmer Picker  - FA (5.28.2020) showed staining / window defect corresponding temporal RPE changes OU -- no active CNV OU  - S/P IVA OD #1 (09.11.20), #2 (10.19.20), #3 (11.23.20), #4 (01.07.21), #5 (2.19.21), #6  (04.02.21), #7 (05.28.21), #8 (07.23.21), #9 (09.24.21), #10 (11.29.21), #11 (02.11.22), #12 (05.05.22), #13 (7.19.22), #14 (09.13.22), #15 (10.02.23), #16 (11.20.23), #17 (01.15.24), #18 (03.11.24)  - OCT shows stable improvement in cystic changes nasal and temporal fovea, patchy ORA / GA at 4+ mos since last injxn  - BCVA OD stable at 20/40  - recommend holding IVA OD today -- will treat PRN - IVA informed consent obtained and signed, 05.08.23 (OU) - see procedure note  - f/u 7 weeks DFE, OCT  3,4. Hypertensive retinopathy OU  - discussed importance of tight BP control  - monitor  5. Pseudophakia OU  - s/p CE/IOL OU  - beautiful surgeries, doing well  - monitor  6. Dry eyes OU  - recommend artificial tears and lubricating ointment as needed  7. PCO OU (OS > OD)  - s/p Yag Cap OS 07.31.24 - completed PF qid OS for 7 days - BCVA OS 20/40 from 20/70 and pt pleased with result     Ophthalmic Meds Ordered this visit:  Meds ordered this encounter  Medications   aflibercept (EYLEA) SOLN 2 mg     Return in about 7 weeks (around 05/31/2023) for f/u exu ARMD OU, DFE, OCT.  There are no Patient Instructions on file for this visit.  This document serves as a record of services personally performed by Karie Chimera, MD, PhD. It was created on their behalf by Annalee Genta, COMT. The creation of this record is the provider's dictation and/or activities during the visit.  Electronically signed by: Annalee Genta, COMT 04/12/23 6:34 PM  This document serves as a record of services personally performed by Karie Chimera, MD, PhD. It was created on their behalf by Glee Arvin. Manson Passey, OA an ophthalmic technician. The creation of this record is the provider's dictation and/or activities during the visit.    Electronically signed by: Glee Arvin. Manson Passey, OA 04/12/23 6:34 PM  Karie Chimera, M.D., Ph.D. Diseases & Surgery of the Retina and Vitreous [facility]  I have  reviewed the above documentation for accuracy and completeness, and I agree with the above. Karie Chimera, M.D., Ph.D. 04/12/23 6:35 PM  Abbreviations: M myopia (nearsighted); A astigmatism; H hyperopia (farsighted); P presbyopia; Mrx spectacle prescription;  CTL contact lenses; OD right eye; OS left eye; OU both eyes  XT exotropia; ET esotropia; PEK punctate epithelial keratitis; PEE punctate epithelial erosions; DES dry eye syndrome; MGD meibomian gland dysfunction; ATs artificial tears; PFAT's preservative free artificial tears;  NSC nuclear sclerotic cataract; PSC posterior subcapsular cataract; ERM epi-retinal membrane; PVD posterior vitreous detachment; RD retinal detachment; DM diabetes mellitus; DR diabetic retinopathy; NPDR non-proliferative diabetic retinopathy; PDR proliferative diabetic retinopathy; CSME clinically significant macular edema; DME diabetic macular edema; dbh dot blot hemorrhages; CWS cotton wool spot; POAG primary open angle glaucoma; C/D cup-to-disc ratio; HVF humphrey visual field; GVF goldmann visual field; OCT optical coherence tomography; IOP intraocular pressure; BRVO Branch retinal vein occlusion; CRVO central retinal vein occlusion; CRAO central retinal artery occlusion; BRAO branch retinal artery occlusion; RT retinal tear; SB scleral buckle; PPV pars plana vitrectomy; VH Vitreous hemorrhage; PRP panretinal laser photocoagulation; IVK intravitreal kenalog; VMT vitreomacular traction; MH Macular hole;  NVD neovascularization of the disc; NVE neovascularization elsewhere; AREDS age related eye disease study; ARMD age related macular degeneration; POAG primary open angle glaucoma; EBMD epithelial/anterior basement membrane dystrophy; ACIOL anterior chamber intraocular lens; IOL intraocular lens; PCIOL posterior chamber intraocular lens; Phaco/IOL phacoemulsification with intraocular lens placement; PRK photorefractive keratectomy; LASIK laser assisted in situ keratomileusis; HTN  hypertension; DM diabetes mellitus; COPD chronic obstructive pulmonary disease

## 2023-04-12 ENCOUNTER — Ambulatory Visit (INDEPENDENT_AMBULATORY_CARE_PROVIDER_SITE_OTHER): Payer: Medicare Other | Admitting: Ophthalmology

## 2023-04-12 ENCOUNTER — Encounter (INDEPENDENT_AMBULATORY_CARE_PROVIDER_SITE_OTHER): Payer: Self-pay | Admitting: Ophthalmology

## 2023-04-12 DIAGNOSIS — H04123 Dry eye syndrome of bilateral lacrimal glands: Secondary | ICD-10-CM

## 2023-04-12 DIAGNOSIS — I10 Essential (primary) hypertension: Secondary | ICD-10-CM

## 2023-04-12 DIAGNOSIS — H35033 Hypertensive retinopathy, bilateral: Secondary | ICD-10-CM

## 2023-04-12 DIAGNOSIS — H353221 Exudative age-related macular degeneration, left eye, with active choroidal neovascularization: Secondary | ICD-10-CM

## 2023-04-12 DIAGNOSIS — H353231 Exudative age-related macular degeneration, bilateral, with active choroidal neovascularization: Secondary | ICD-10-CM | POA: Diagnosis not present

## 2023-04-12 DIAGNOSIS — H26492 Other secondary cataract, left eye: Secondary | ICD-10-CM

## 2023-04-12 DIAGNOSIS — Z961 Presence of intraocular lens: Secondary | ICD-10-CM | POA: Diagnosis not present

## 2023-04-12 DIAGNOSIS — H353211 Exudative age-related macular degeneration, right eye, with active choroidal neovascularization: Secondary | ICD-10-CM

## 2023-04-12 MED ORDER — AFLIBERCEPT 2MG/0.05ML IZ SOLN FOR KALEIDOSCOPE
2.0000 mg | INTRAVITREAL | Status: AC | PRN
Start: 2023-04-12 — End: 2023-04-12
  Administered 2023-04-12: 2 mg via INTRAVITREAL

## 2023-04-27 ENCOUNTER — Other Ambulatory Visit: Payer: Self-pay | Admitting: Family Medicine

## 2023-04-27 DIAGNOSIS — I10 Essential (primary) hypertension: Secondary | ICD-10-CM

## 2023-04-27 DIAGNOSIS — E039 Hypothyroidism, unspecified: Secondary | ICD-10-CM

## 2023-05-26 ENCOUNTER — Encounter: Payer: Self-pay | Admitting: Physician Assistant

## 2023-05-26 ENCOUNTER — Telehealth: Payer: Self-pay | Admitting: Adult Health

## 2023-05-26 ENCOUNTER — Ambulatory Visit: Payer: Medicare Other | Attending: Physician Assistant | Admitting: Physician Assistant

## 2023-05-26 VITALS — BP 108/50 | HR 52 | Ht 63.0 in | Wt 127.0 lb

## 2023-05-26 DIAGNOSIS — I10 Essential (primary) hypertension: Secondary | ICD-10-CM | POA: Diagnosis not present

## 2023-05-26 DIAGNOSIS — E785 Hyperlipidemia, unspecified: Secondary | ICD-10-CM | POA: Diagnosis not present

## 2023-05-26 DIAGNOSIS — R001 Bradycardia, unspecified: Secondary | ICD-10-CM | POA: Diagnosis not present

## 2023-05-26 DIAGNOSIS — I2510 Atherosclerotic heart disease of native coronary artery without angina pectoris: Secondary | ICD-10-CM

## 2023-05-26 MED ORDER — HYDRALAZINE HCL 10 MG PO TABS: ORAL_TABLET | ORAL | Status: DC

## 2023-05-26 NOTE — Telephone Encounter (Signed)
Follow Up:    Patient says she has an appointment this morning with Kaitlyn Parks, but she thinks she needs to be seen sooner.

## 2023-05-26 NOTE — Telephone Encounter (Signed)
Pt reports increased HR and BP since lastnight. BP and HR still within normal limits but patient states her HR usually runs 40s-50s. No CP, headache, blurred vision or extremity numbness reported. Pt is currently SOB.   Informed pt that her prescription for the hydralazine states to take an extra dose for SBP >160 up to four doses in total for the day. Pt only reported one SBP that met this range but had taken three extra doses since lastnight.   Recommended pt keep her appointment with Kaitlyn Parks this morning and go to ER is she has new or worsening of symptoms previously states. Pt verbalized understanding. No further questions at this time.

## 2023-05-26 NOTE — Progress Notes (Signed)
Cardiology Office Note:  .   Date:  05/26/2023  ID:  Kaitlyn Parks, Kaitlyn Parks 20-Oct-1934, MRN [id number] PCP: Karie Georges, MD  [facility] Providers Cardiologist:  Rollene Rotunda, MD Cardiology APP:  Marcelino Duster, [place]     History of Present Illness: .   Kaitlyn Parks is a 88 y.o. female with hypothyroidism, HTN, HLD and CAD. She had a PCI of LAD in 2012. CTA of the chest in Nov 2018 was negative for PE however troponin was elevated at 0.42. Echocardiogram obtained on 06/13/2017 showed EF 60-65%, mild LVH, grade 1 DD, mild MR, mild TR, peak PA pressure 30 mmHg. Cardiac catheterization performed on 06/14/2017 showed 95% ostial D1 lesion, widely patent ostial LAD stent, 30% proximal LAD stenosis. Medical therapy was recommended. Imdur 60 mg daily was added to her medical regimen. Beta blocker was initially increased and later reduced and was eventually discontinued due to symptomatic bradycardia. She is on PRN hydralazine for blood pressure spike. Patient was most recently seen by Dr. Antoine Poche on 11/07/2019 at which time she was doing well.  Abdominal ultrasound obtained on 12/13/2019 was negative for AAA.  I saw the patient in October 2021 at which time she had dizzy spell and the chest tightness and shortness of breath. Orthostatic vitals at the time showed systolic blood pressure dropping from 181 lying down to 159 standing.  Myoview in October 2021 showed EF 61%, overall low risk study without significant ischemia.  Echocardiogram obtained in March 2023 showed EF 61%, grade 1 DD, RVSP 19.5 mmHg, normal RV, mild to moderate MR, mild AI.  Repeat Myoview in March 2023 shows normal perfusion, EF 64%.    More recently, patient was seen at [facility], ED on 02/13/2023 for dizziness.  Patient recently had diarrhea and was taking Cipro.  Blood work showed normal CBC, stable renal function the electrolyte, normal magnesium level.  CT of the head and neck showed no acute intracranial process or  large vessel occlusion, beading of mid to distal bilateral internal carotid arteries concerning for fibromuscular dysplasia.  MRI of the brain was normal.  EKG shows sinus bradycardia with PACs.  Patient presents today for follow-up.  She is still concerned about occasional blood pressure spikes.  Based on the home blood pressure readings she had, occasionally her blood pressure will spike up to 160-170s range.  In the past 24 hours, she has taken 5 doses of hydralazine as needed.  Our medication list suggest that she is on hydralazine 10 mg twice a day with extra dose as needed.  She is in fact taking hydralazine only as needed and not as a scheduled medication.  Based on blood pressure recording from her blood pressure cuff, most of the time her blood pressure still normal.  I do not recommend permanent up titration of her medication.  She has chronic bradycardia with heart rate often dip down to the high 40s.  She is not on any AV nodal blocking agent.  We discussed that she likely has conduction disorder, over the years, she likely will have worsening conduction disorder and may end up needing a pacemaker.  At this time, she does not have any significant dizziness, blurry vision or feeling of passing out associated with bradycardia.  She can follow-up with Dr. Antoine Poche in a few month.   ROS:   She denies chest pain, palpitations, dyspnea, pnd, orthopnea, n, v, dizziness, syncope, edema, weight gain, or early satiety. All other systems reviewed and are  otherwise negative except as noted above.    Studies Reviewed: .        Cardiac Studies & Procedures   CARDIAC CATHETERIZATION  CARDIAC CATHETERIZATION 06/14/2017  Narrative Images from the original result were not included.   Ost 1st Diag lesion is 95% stenosed.  Previously placed Ost LAD to Prox LAD stent (unknown type) is widely patent.  Prox LAD lesion is 30% stenosed.  Kaitlyn Parks is a 88 y.o. female   [id number] LOCATION:   FACILITY: [facility] PHYSICIAN: Nanetta Batty, M.D. 1935/03/08   DATE OF PROCEDURE:  06/14/2017  DATE OF DISCHARGE:     CARDIAC CATHETERIZATION    History obtained from chart review. [name] is an 87 year old ill-appearing recently widowed Caucasian female patient of Dr. Jenene Slicker who is 4 days status post right total hip replacement. She did have a proximal LAD stent placed by Dr. Juanda Chance in 2012. She had chest and jaw pain over the weekend with mildly elevated troponins. Her EKG showed no acute changes and her LV function was normal by 2-D echo without segmental wall motion abnormalities. She was placed on Lovenox and presents today for elective coronary angiography to define her anatomy and rule out an ischemic etiology.  Impression [name] has a widely patent proximal LAD stent with a 90-95% ostial first diagonal branch stenosis and a small to medium-sized vessel. Her other coronary arteries are free of disease. I do not think the diagonal requires intervention. I recommend continued medical therapy. The sheath was removed and a small TR band was placed on the right wrist to achieve hemostasis. The patient left the lab in stable condition.  Nanetta Batty. MD, [facility] 06/14/2017 8:25 AM  Findings Coronary Findings Diagnostic  Dominance: Right  Left Anterior Descending Previously placed Ost LAD to Prox LAD stent (unknown type) is widely patent. Prox LAD lesion is 30% stenosed.  First Diagonal Branch Ost 1st Diag lesion is 95% stenosed.  Intervention  No interventions have been documented.   STRESS TESTS  MYOCARDIAL PERFUSION IMAGING 10/27/2021  Narrative   ECG is abnormal.  Baseline EKG showed junctional bradycardia.   No ST deviation was noted.   LV perfusion is normal. There is no evidence of ischemia. There is no evidence of infarction.   Left ventricular function is normal. Nuclear stress EF: 64 %. The left ventricular ejection fraction is normal (55-65%). End  diastolic cavity size is normal. End systolic cavity size is normal.   Prior study available for comparison from 05/29/2020.   The study is normal. The study is low risk.   ECHOCARDIOGRAM  ECHOCARDIOGRAM COMPLETE 10/27/2021  Narrative ECHOCARDIOGRAM REPORT    Patient Name:   Kaitlyn Parks Date of Exam: 10/27/2021 Medical Rec #:  [id number]      Height:       62.0 in Accession #:    [id number]     Weight:       122.0 lb Date of Birth:  [birth year]      BSA:          1.549 m Patient Age:    86 years       BP:           169/78 mmHg Patient Gender: F              HR:           45 bpm. Exam Location:  [address]  Procedure: 2D Echo, 3D Echo, Cardiac Doppler and Color Doppler  Indications:  R01.1 Murmur  History:        Patient has prior history of Echocardiogram examinations, most recent 06/13/2017. NSTEMI and CAD; Risk Factors:Hypertension and HLD.  Sonographer:    Clearence Ped RCS Referring Phys: [id number] ANGELA NICOLE DUKE  IMPRESSIONS   1. Left ventricular ejection fraction, by estimation, is 60 to 65%. Left ventricular ejection fraction by 3D volume is 61 %. The left ventricle has normal function. The left ventricle has no regional wall motion abnormalities. Left ventricular diastolic parameters are consistent with Grade I diastolic dysfunction (impaired relaxation). 2. Right ventricular systolic function is normal. The right ventricular size is normal. There is normal pulmonary artery systolic pressure. The estimated right ventricular systolic pressure is 19.5 mmHg. 3. The mitral valve is normal in structure. Mild to moderate mitral valve regurgitation. 4. The aortic valve is tricuspid. There is mild calcification of the aortic valve. There is mild thickening of the aortic valve. Aortic valve regurgitation is mild. Aortic valve sclerosis/calcification is present, without any evidence of aortic stenosis. 5. The inferior vena cava is normal in size with greater than 50%  respiratory variability, suggesting right atrial pressure of 3 mmHg.  Comparison(s): Compared to prior TTE in 06/2017, there is no significant change.  FINDINGS Left Ventricle: Left ventricular ejection fraction, by estimation, is 60 to 65%. Left ventricular ejection fraction by 3D volume is 61 %. The left ventricle has normal function. The left ventricle has no regional wall motion abnormalities. The left ventricular internal cavity size was normal in size. There is no left ventricular hypertrophy. Left ventricular diastolic parameters are consistent with Grade I diastolic dysfunction (impaired relaxation).  Right Ventricle: The right ventricular size is normal. No increase in right ventricular wall thickness. Right ventricular systolic function is normal. There is normal pulmonary artery systolic pressure. The tricuspid regurgitant velocity is 2.03 m/s, and with an assumed right atrial pressure of 3 mmHg, the estimated right ventricular systolic pressure is 19.5 mmHg.  Left Atrium: Left atrial size was normal in size.  Right Atrium: Right atrial size was normal in size.  Pericardium: There is no evidence of pericardial effusion.  Mitral Valve: The mitral valve is normal in structure. There is mild thickening of the mitral valve leaflet(s). There is mild calcification of the mitral valve leaflet(s). Mild to moderate mitral annular calcification. Mild to moderate mitral valve regurgitation.  Tricuspid Valve: The tricuspid valve is normal in structure. Tricuspid valve regurgitation is trivial.  Aortic Valve: The aortic valve is tricuspid. There is mild calcification of the aortic valve. There is mild thickening of the aortic valve. Aortic valve regurgitation is mild. Aortic regurgitation PHT measures 1160 msec. Aortic valve sclerosis/calcification is present, without any evidence of aortic stenosis.  Pulmonic Valve: The pulmonic valve was normal in structure. Pulmonic valve regurgitation is  not visualized.  Aorta: The aortic root and ascending aorta are structurally normal, with no evidence of dilitation.  Venous: The inferior vena cava is normal in size with greater than 50% respiratory variability, suggesting right atrial pressure of 3 mmHg.  IAS/Shunts: The atrial septum is grossly normal.   LEFT VENTRICLE PLAX 2D LVIDd:         4.20 cm         Diastology LVIDs:         2.40 cm         LV e' medial:    7.83 cm/s LV PW:         0.80 cm  LV E/e' medial:  11.1 LV IVS:        0.90 cm         LV e' lateral:   10.20 cm/s LVOT diam:     1.60 cm         LV E/e' lateral: 8.5 LV SV:         54 LV SV Index:   35 LVOT Area:     2.01 cm        3D Volume EF LV 3D EF:    Left ventricul ar ejection fraction by 3D volume is 61 %.  3D Volume EF: 3D EF:        61 % LV EDV:       86 ml LV ESV:       33 ml LV SV:        52 ml  RIGHT VENTRICLE RV Basal diam:  2.80 cm RV S prime:     12.30 cm/s TAPSE (M-mode): 1.7 cm RVSP:           19.5 mmHg  LEFT ATRIUM             Index        RIGHT ATRIUM           Index LA diam:        4.10 cm 2.65 cm/m   RA Pressure: 3.00 mmHg LA Vol (A2C):   48.9 ml 31.56 ml/m  RA Area:     11.00 cm LA Vol (A4C):   36.7 ml 23.69 ml/m  RA Volume:   24.50 ml  15.81 ml/m LA Biplane Vol: 42.2 ml 27.24 ml/m AORTIC VALVE LVOT Vmax:   115.00 cm/s LVOT Vmean:  82.000 cm/s LVOT VTI:    0.270 m AI PHT:      1160 msec  AORTA Ao Root diam: 3.00 cm Ao Asc diam:  3.30 cm  MITRAL VALVE                  TRICUSPID VALVE MV Area (PHT):                TR Peak grad:   16.5 mmHg MV Decel Time:                TR Vmax:        203.00 cm/s MR Peak grad:    178.0 mmHg   Estimated RAP:  3.00 mmHg MR Mean grad:    113.0 mmHg   RVSP:           19.5 mmHg MR Vmax:         667.00 cm/s MR Vmean:        501.0 cm/s   SHUNTS MR PISA:         1.57 cm     Systemic VTI:  0.27 m MR PISA Eff ROA: 7 mm        Systemic Diam: 1.60 cm MR PISA Radius:  0.50 cm MV  E velocity: 87.00 cm/s MV A velocity: 82.30 cm/s MV E/A ratio:  1.06  Laurance Flatten MD Electronically signed by Laurance Flatten MD Signature Date/Time: 10/27/2021/11:42:26 AM    Final             Risk Assessment/Calculations:             Physical Exam:   VS:  BP (!) 108/50 (BP Location: Left Arm, Patient Position: Sitting, Cuff Size: Normal)   Pulse (!) 52   Ht 5\' 3"  (1.6 m)  Wt 127 lb (57.6 kg)   SpO2 95%   BMI 22.50 kg/m    Wt Readings from Last 3 Encounters:  05/26/23 127 lb (57.6 kg)  02/18/23 123 lb (55.8 kg)  02/13/23 125 lb 10.6 oz (57 kg)    GEN: Well nourished, well developed in no acute distress NECK: No JVD; No carotid bruits CARDIAC: RRR, no murmurs, rubs, gallops RESPIRATORY:  Clear to auscultation without rales, wheezing or rhonchi  ABDOMEN: Soft, non-tender, non-distended EXTREMITIES:  No edema; No deformity   ASSESSMENT AND PLAN: .    CAD: Denies any chest pain.  Continue aspirin.  No beta-blocker given bradycardia.  Hypertension: Blood pressure stable  Hyperlipidemia: On rosuvastatin  Asymptomatic bradycardia: Not on any AV nodal blocking agent, she does have evidence of conduction disorder, may need pacemaker in a few years.  She is aware to contact cardiology service if she becomes symptomatic with dizziness, blurry vision or feeling of passing out.       Dispo: Follow-up with Dr. Antoine Poche  Signed, Azalee Course, [place]

## 2023-05-26 NOTE — Patient Instructions (Signed)
Medication Instructions:  NO CHANGES *If you need a refill on your cardiac medications before your next appointment, please call your pharmacy*   Lab Work: NO LABS If you have labs (blood work) drawn today and your tests are completely normal, you will receive your results only by: MyChart Message (if you have MyChart) OR A paper copy in the mail If you have any lab test that is abnormal or we need to change your treatment, we will call you to review the results.   Testing/Procedures: NO TESTING   Follow-Up: At [facility], you and your health needs are our priority.  As part of our continuing mission to provide you with exceptional heart care, we have created designated Provider Care Teams.  These Care Teams include your primary Cardiologist (physician) and Advanced Practice Providers (APPs -  Physician Assistants and Nurse Practitioners) who all work together to provide you with the care you need, when you need it.    Your next appointment:   3-4 month(s)  Provider:   Rollene Rotunda, MD

## 2023-05-26 NOTE — Telephone Encounter (Signed)
STAT if HR is under 50 or over 120 (normal HR is 60-100 beats per minute)  What is your heart rate? 85, this is high for her, she says she usually run in the 40's and 50, 71 this morning, at   85 at 8:00 AM*  Do you have a log of your heart rate readings (document readings)?   Do you have any other symptoms?  Little lightheaded, last ni - blood pressure was 170/94 she took 2 hydralazine pills, this morning at 4:00 AM it was 146/83 heart rate 71- she took 2 more Hydralazine pills. At 8:00 AM t her blood pressure  was 118/70 heart rate was 85. She took 1 Hydralazine after that    Patient thinks she need to be seen.

## 2023-05-28 NOTE — Progress Notes (Signed)
[facility] - Clinic Note  05/31/2023    CHIEF COMPLAINT Patient presents for Retina Follow Up  HISTORY OF PRESENT ILLNESS: Kaitlyn Parks is a 88 y.o. female who presents to the clinic today for:   HPI     Retina Follow Up   Patient presents with  Wet AMD.  In left eye.  This started 7 weeks ago.  Severity is moderate.  Duration of 7 weeks.  Since onset it is stable.  I, the attending physician,  performed the HPI with the patient and updated documentation appropriately.        Comments   7 week retina follow up IVE OS pt is reporting no vision changes noticed pt denies any flashes or floaters       Last edited by Rennis Chris, MD on 06/01/2023  9:55 PM.     Pt states she feels like her vision is doing okay, she is able to read small print with her reading glasses  Referring physician: Karie Georges, MD [address] [place],  [place] [address]  HISTORICAL INFORMATION:  Selected notes from the MEDICAL RECORD NUMBER Referred by Dr. [place] DeMarco for concern of exu ARMD LEE: 11.26.19 (J. 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   No current facility-administered medications for this visit. (Ophthalmic Drugs)   Current Outpatient Medications (Other)  Medication Sig   alum & mag hydroxide-simeth (MAALOX PLUS) 400-400-40 MG/5ML suspension Take 15 mLs by mouth every 6 (six) hours as needed for indigestion.   amLODipine (NORVASC) 2.5 MG tablet Take 1 tablet (2.5 mg total) by mouth daily.   aspirin EC 81 MG tablet Take 81 mg by mouth daily.   Biotin w/ Vitamins C & E (HAIR/SKIN/NAILS PO) Take 1 tablet by mouth daily.    Cranberry 400 MG CAPS Take 400 mg by mouth daily.   dicyclomine (BENTYL) 20 MG tablet Take 1 tablet (20 mg total) by mouth 2 (two) times daily.   hydrALAZINE (APRESOLINE) 10 MG tablet FOR SBP >160 UP TO A TOTAL OF 4 TIMES A DAY. As Needed   ibuprofen (ADVIL) 400 MG tablet Take 400 mg by mouth every 6 (six) hours as needed for mild pain.   isosorbide mononitrate (IMDUR) 60 MG 24 hr tablet TAKE 1 & 1/2 TABLETS A DAY.   meclizine (ANTIVERT) 25 MG tablet Take 1 tablet (25 mg total) by mouth 3 (three) times daily as needed for dizziness.   Multiple Vitamins-Calcium (ONE-A-DAY WOMENS PO) Take 1 tablet by mouth daily.    nitroGLYCERIN (NITROSTAT) 0.4 MG SL tablet DISSOLVE 1 TABLET UNDER TONGUE IF NEEDED FOR CHEST PAIN. MAY REPEAT IN 5 MINUTES FOR 3 DOSES.   NON FORMULARY Get steroid inject for SI joint every 12 wks with Dr. Smitty Pluck surgery spine   ondansetron (ZOFRAN-ODT) 4 MG disintegrating tablet Take 1 tablet (4 mg total) by mouth every 8 (eight) hours as needed for nausea or vomiting.   PRESCRIPTION MEDICATION Avastin eye injections given by Dr Vanessa Barbara due to macular degeneration . Every 7wks.   rosuvastatin (CRESTOR) 10 MG tablet TABLET ONE-HALF TABLET BY MOUTH DAILY   SYNTHROID 50 MCG tablet TAKE 1 TABLET EACH DAY. NEED APPOINTMENT FOR ADDITIONAL REFILLS  No current facility-administered medications for this visit. (Other)   Facility-Administered Medications Ordered in Other Visits (Other)  Medication Route   technetium tetrofosmin (TC-MYOVIEW) injection 32.9 millicurie Intravenous   REVIEW OF SYSTEMS: ROS   Positive for: Gastrointestinal, Genitourinary, Musculoskeletal, Cardiovascular, Eyes Negative for: Constitutional, Neurological, Skin, HENT, Endocrine, Respiratory, Psychiatric, Allergic/Imm, Heme/Lymph Last edited by Etheleen Mayhew, COT on 05/31/2023  1:20 PM.        ALLERGIES Allergies  Allergen Reactions   Acyclovir And Related Other (See Comments)    unknown    Ciprofloxacin Other (See Comments)    dizziness   Pravachol [Pravastatin Sodium] Other (See Comments)    cystitis   Sulfa Antibiotics     nausea   Zocor [Simvastatin] Other (See Comments)    cystitis   PAST MEDICAL HISTORY Past Medical History:  Diagnosis Date   Arthritis    DDD, scoliosis, sees Dr. Lovell Sheehan for this, uses norco very rarely for pain   Bradycardia 11/11/2017   CAD (coronary artery disease)    LAD stenting of a 90% lesion 2012   Cystocele    Educated about COVID-19 virus infection 12/06/2019   Elevated cholesterol    GERD (gastroesophageal reflux disease)    dx in work up 2016 for atypical CP at OSH   pt. denies   Heart murmur    Hypertensive retinopathy    OU   Hypothyroidism    Interstitial cystitis    sees Dr. [name]   Macular degeneration    OU   NSTEMI (non-ST elevated myocardial infarction) [facility])    Osteoporosis    Rectocele    S/P hip replacement, right 06/12/2017   Scoliosis    Thyroid disease    Hypothyroid   Urinary incontinence    USI   Uterine prolapse    Past Surgical History:  Procedure Laterality Date   BLADDER SUSPENSION  2011   CATARACT EXTRACTION Bilateral 2015   Dr. Elmer Picker   CORONARY ANGIOPLASTY WITH STENT PLACEMENT  04/21/2010   LAD 80%, RCA 30%, nl EF, s/p DES LAD   EYE SURGERY     LEFT HEART CATH AND CORONARY ANGIOGRAPHY N/A 06/14/2017   Procedure: LEFT HEART CATH AND CORONARY ANGIOGRAPHY;  Surgeon: Runell Gess, MD;  Location: [facility] INVASIVE CV LAB;  Service: Cardiovascular;  Laterality: N/A;   OOPHORECTOMY  2011   BSO   TOTAL HIP ARTHROPLASTY Right 06/10/2017   Procedure: RIGHT TOTAL HIP ARTHROPLASTY ANTERIOR APPROACH;  Surgeon: Samson Frederic, MD;  Location: [facility] ORS;  Service: Orthopedics;  Laterality: Right;  Needs RNFA   VAGINAL HYSTERECTOMY  2011   LAVH BSO; benign   FAMILY HISTORY Family History  Problem Relation Age of Onset   Hypertension Mother    Heart disease Mother    Heart disease Father    Heart disease  Sister    Diabetes Sister    Uterine cancer Sister        mets to lungs   Lung cancer Sister    Scoliosis Sister    Heart disease Brother    Colon cancer Paternal Aunt 30   Breast cancer Paternal Aunt        Age 64's   Breast cancer Paternal Aunt    Leukemia Paternal Aunt    Lung cancer Paternal Grandfather        smoker   Arthritis Daughter    Breast cancer Cousin        Maternal 1st cousins-Age 45's   Esophageal cancer Neg Hx  Pancreatic cancer Neg Hx    Stomach cancer Neg Hx    SOCIAL HISTORY Social History   Tobacco Use   Smoking status: Never   Smokeless tobacco: Never  Vaping Use   Vaping status: Never Used  Substance Use Topics   Alcohol use: No    Comment: Rare   Drug use: No       OPHTHALMIC EXAM: Base Eye Exam     Visual Acuity (Snellen - Linear)       Right Left   Dist [place] 20/40 -1 20/40 -2   Dist ph [place] NI NI         Tonometry (Tonopen, 1:27 PM)       Right Left   Pressure 16 16         Pupils       Pupils Dark Light Shape React APD   Right PERRL 3 2 Round Brisk None   Left PERRL 3 2 Round Brisk None         Visual Fields       Left Right    Full Full         Extraocular Movement       Right Left    Full, Ortho Full, Ortho         Neuro/Psych     Oriented x3: Yes   Mood/Affect: Normal         Dilation     Both eyes: 2.5% Phenylephrine @ 1:27 PM           Slit Lamp and Fundus Exam     Slit Lamp Exam       Right Left   Lids/Lashes mild Telangiectasia -- improved, marginal lesion nasal UL Dermatochalasis - upper lid   Conjunctiva/Sclera White and quiet White and quiet, inferior conj chalasis   Cornea Arcus, 2+ fine Punctate epithelial erosions, tear film debris, decreased TBUT arcus, tear film debris, 2+ inferior Punctate epithelial erosions   Anterior Chamber Deep and clear Deep and clear   Iris Round and dilated Round and well dilated   Lens PC IOL in good position, 1+Posterior capsular  opacification IN PC IOL in good position with open PC   Anterior Vitreous Vitreous syneresis, Posterior vitreous detachment Vitreous syneresis, Posterior vitreous detachment, vitreous condensations         Fundus Exam       Right Left   Disc Pink and Sharp, Compact, focal PPP Pink and Sharp, mild temporal PPA/PPP   C/D Ratio 0.3 0.3   Macula Blunted foveal reflex, Drusen, RPE mottling and clumping, early Atrophy, +PEDs -- stably improved, trace cystic changes -- stably improved, No frank heme Blunted foveal reflex, +drusen, pigment clumping, RPE mottling, clumping and atrophy, no heme, central PED / CNV with overlying cystic changes -- stably improved, +GA   Vessels attenuated, Tortuous attenuated, Tortuous   Periphery Attached, scattered reticular degeneration Attached, scattered reticular degeneration           IMAGING AND PROCEDURES  Imaging and Procedures for @TODAY @  OCT, Retina - OU - Both Eyes       Right Eye Quality was good. Central Foveal Thickness: 240. Progression has been stable. Findings include no IRF, no SRF, abnormal foveal contour, retinal drusen , intraretinal hyper-reflective material, pigment epithelial detachment, outer retinal atrophy (stable improvement in cystic changes nasal and temporal fovea, patchy ORA / GA -- no fluid).   Left Eye Quality was good. Central Foveal Thickness: 261. Progression has been stable. Findings include  normal foveal contour, no IRF, no SRF, retinal drusen , intraretinal hyper-reflective material, pigment epithelial detachment, outer retinal atrophy (Stable improvement in IRF/cystic changes inferior fovea, stable improvement in [facility] / PED inferior to fovea ).   Notes *Images captured and stored on drive  Diagnosis / Impression:  OD: exudative ARMD -- stable improvement in cystic changes nasal and temporal fovea, patchy ORA / GA -- no fluid OS: exu-ARMD -- Stable improvement in IRF/cystic changes inferior fovea, stable improvement  in [facility] / PED inferior to fovea   Clinical management:  See below  Abbreviations: NFP - Normal foveal profile. CME - cystoid macular edema. PED - pigment epithelial detachment. IRF - intraretinal fluid. SRF - subretinal fluid. EZ - ellipsoid zone. ERM - epiretinal membrane. ORA - outer retinal atrophy. ORT - outer retinal tubulation. SRHM - subretinal hyper-reflective material      Intravitreal Injection, Pharmacologic Agent - OS - Left Eye       Time Out 05/31/2023. 1:51 PM. Confirmed correct patient, procedure, site, and patient consented.   Anesthesia Topical anesthesia was used. Anesthetic medications included Lidocaine 2%, Proparacaine 0.5%.   Procedure Preparation included 5% betadine to ocular surface, eyelid speculum. A (32g) needle was used.   Injection: 2 mg aflibercept 2 MG/0.05ML   Route: Intravitreal, Site: Left Eye   NDC: L6038910, Lot: [id number], Expiration date: 04/02/2024, Waste: 0 mL   Post-op Post injection exam found visual acuity of at least counting fingers. The patient tolerated the procedure well. There were no complications. The patient received written and verbal post procedure care education. Post injection medications were not given.            ASSESSMENT/PLAN:    ICD-10-CM   1. Exudative age-related macular degeneration of left eye with active choroidal neovascularization (HCC)  H35.3221 OCT, Retina - OU - Both Eyes    Intravitreal Injection, Pharmacologic Agent - OS - Left Eye    aflibercept (EYLEA) SOLN 2 mg    2. Exudative age-related macular degeneration of right eye with active choroidal neovascularization (HCC)  H35.3211     3. Essential hypertension  I10     4. Hypertensive retinopathy of both eyes  H35.033     5. Pseudophakia of both eyes  Z96.1     6. Dry eyes  H04.123     7. Left posterior capsular opacification  H26.492      1. Exudative age-related macular degeneration, left eye   - OCT 4.30.2020 showed interval  conversion of OS from nonexudative ARMD to exu-ARMD with large dome-shaped PED with overlying IRF/SRF  - FA 05.28.20 -- no active CNV OS, just staining  - s/p IVA OS #1 (04.30.20), #2 (05.28.20), #3 (06.26.20), #4 (08.03.20), #5 (09.11.20), #6 (10.19.20), #7 (11.23.20),  - reactivation of CNV noted on 05.05.22 -- s/p IVA OS  #8 (05.05.22), #9 (06.06.22), #10 (07.19.22), #11 (09.13.22), #12 (10.25.22), #13 (12.13.22), #14 (01.30.23), #15 (3.20.23), #16 (05.08.23), #17 (06.19.23), #18 (08.07.23), #19 (10.02.23), #20 (11.20.23), #21 (01.15.24)  - s/p IVE OS #1 (03.11.24), #2 (04.22.24), #3 (05.29.24), #4 (07.22.24), #5 (09.09.24)  - **Interval increase in IRF overlying PED at 8 wks on 09.13.22 visit**  - BCVA OS stable at 20/40 -- improved post-YAG  - OCT shows OS: Stable improvement in IRF/cystic changes inferior fovea, stable improvement in [facility] / PED inferior to fovea at 7 wks  - recommend IVE OS #6 today, 10.28.24 with follow up extended to 8 weeks  - pt wishes to proceed with injection  -  RBA of procedure discussed, questions answered - see procedure note - IVE informed consent obtained and signed, 03.11.24  - benefits investigation for Eylea initiated 4.30.2020 -- approved for 2024  - f/u in 8 weeks -- DFE/OCT, possible injection  2. Age related macular degeneration, exudative, OD  - interval development of IRF first noted on 09.11.20 -- conversion from nonexudative to exudative ARMD  - pt initially presented on 11.27.19 due to alert from [employer] home monitoring system for OD  - [facility] prescribed by Dr. Elmer Picker  - FA (5.28.2020) showed staining / window defect corresponding temporal RPE changes OU -- no active CNV OU  - S/P IVA OD #1 (09.11.20), #2 (10.19.20), #3 (11.23.20), #4 (01.07.21), #5 (2.19.21), #6 (04.02.21), #7 (05.28.21), #8 (07.23.21), #9 (09.24.21), #10 (11.29.21), #11 (02.11.22), #12 (05.05.22), #13 (7.19.22), #14 (09.13.22), #15 (10.02.23), #16 (11.20.23), #17 (01.15.24), #18  (03.11.24)  - OCT shows stable improvement in cystic changes nasal and temporal fovea, patchy ORA / GA at 4+ mos since last injxn  - BCVA OD stable at 20/40  - recommend holding IVA OD today -- will treat PRN - IVA informed consent obtained and signed, 05.08.23 (OU) - see procedure note  - f/u 8 weeks DFE, OCT  3,4. Hypertensive retinopathy OU  - discussed importance of tight BP control  - monitor  5. Pseudophakia OU  - s/p CE/IOL OU  - beautiful surgeries, doing well  - monitor  6. Dry eyes OU  - recommend artificial tears and lubricating ointment as needed  7. PCO OU (OS > OD)  - s/p Yag Cap OS 07.31.24 - completed PF qid OS for 7 days - BCVA OS 20/40 from 20/70 and pleased with the result     Ophthalmic Meds Ordered this visit:  Meds ordered this encounter  Medications   aflibercept (EYLEA) SOLN 2 mg     Return in about 8 weeks (around 07/26/2023) for f/u exu ARMD OU, DFE, OCT.  There are no Patient Instructions on file for this visit.  This document serves as a record of services personally performed by Karie Chimera, MD, PhD. It was created on their behalf by Annalee Genta, COMT. The creation of this record is the provider's dictation and/or activities during the visit.  Electronically signed by: Annalee Genta, COMT 06/01/23 9:55 PM  This document serves as a record of services personally performed by Karie Chimera, MD, PhD. It was created on their behalf by Glee Arvin. Manson Passey, OA an ophthalmic technician. The creation of this record is the provider's dictation and/or activities during the visit.    Electronically signed by: Glee Arvin. Manson Passey, OA 06/01/23 9:55 PM  Karie Chimera, M.D., Ph.D. Diseases & Surgery of the Retina and Vitreous [facility]  I have reviewed the above documentation for accuracy and completeness, and I agree with the above. Karie Chimera, M.D., Ph.D. 06/01/23 9:59 PM   Abbreviations: M myopia (nearsighted); A  astigmatism; H hyperopia (farsighted); P presbyopia; Mrx spectacle prescription;  CTL contact lenses; OD right eye; OS left eye; OU both eyes  XT exotropia; ET esotropia; PEK punctate epithelial keratitis; PEE punctate epithelial erosions; DES dry eye syndrome; MGD meibomian gland dysfunction; ATs artificial tears; PFAT's preservative free artificial tears; NSC nuclear sclerotic cataract; PSC posterior subcapsular cataract; ERM epi-retinal membrane; PVD posterior vitreous detachment; RD retinal detachment; DM diabetes mellitus; DR diabetic retinopathy; NPDR non-proliferative diabetic retinopathy; PDR proliferative diabetic retinopathy; CSME clinically significant macular edema; DME diabetic macular edema; dbh  dot blot hemorrhages; CWS cotton wool spot; POAG primary open angle glaucoma; C/D cup-to-disc ratio; HVF humphrey visual field; GVF goldmann visual field; OCT optical coherence tomography; IOP intraocular pressure; BRVO Branch retinal vein occlusion; CRVO central retinal vein occlusion; CRAO central retinal artery occlusion; BRAO branch retinal artery occlusion; RT retinal tear; SB scleral buckle; PPV pars plana vitrectomy; VH Vitreous hemorrhage; PRP panretinal laser photocoagulation; IVK intravitreal kenalog; VMT vitreomacular traction; MH Macular hole;  NVD neovascularization of the disc; NVE neovascularization elsewhere; AREDS age related eye disease study; ARMD age related macular degeneration; POAG primary open angle glaucoma; EBMD epithelial/anterior basement membrane dystrophy; ACIOL anterior chamber intraocular lens; IOL intraocular lens; PCIOL posterior chamber intraocular lens; Phaco/IOL phacoemulsification with intraocular lens placement; PRK photorefractive keratectomy; LASIK laser assisted in situ keratomileusis; HTN hypertension; DM diabetes mellitus; COPD chronic obstructive pulmonary disease

## 2023-05-31 ENCOUNTER — Encounter (INDEPENDENT_AMBULATORY_CARE_PROVIDER_SITE_OTHER): Payer: Self-pay | Admitting: Ophthalmology

## 2023-05-31 ENCOUNTER — Ambulatory Visit (INDEPENDENT_AMBULATORY_CARE_PROVIDER_SITE_OTHER): Payer: Medicare Other | Admitting: Ophthalmology

## 2023-05-31 DIAGNOSIS — Z961 Presence of intraocular lens: Secondary | ICD-10-CM

## 2023-05-31 DIAGNOSIS — H26492 Other secondary cataract, left eye: Secondary | ICD-10-CM

## 2023-05-31 DIAGNOSIS — H353211 Exudative age-related macular degeneration, right eye, with active choroidal neovascularization: Secondary | ICD-10-CM

## 2023-05-31 DIAGNOSIS — I10 Essential (primary) hypertension: Secondary | ICD-10-CM | POA: Diagnosis not present

## 2023-05-31 DIAGNOSIS — H353231 Exudative age-related macular degeneration, bilateral, with active choroidal neovascularization: Secondary | ICD-10-CM | POA: Diagnosis not present

## 2023-05-31 DIAGNOSIS — H35033 Hypertensive retinopathy, bilateral: Secondary | ICD-10-CM

## 2023-05-31 DIAGNOSIS — H353221 Exudative age-related macular degeneration, left eye, with active choroidal neovascularization: Secondary | ICD-10-CM

## 2023-05-31 DIAGNOSIS — H04123 Dry eye syndrome of bilateral lacrimal glands: Secondary | ICD-10-CM

## 2023-05-31 MED ORDER — AFLIBERCEPT 2MG/0.05ML IZ SOLN FOR KALEIDOSCOPE
2.0000 mg | INTRAVITREAL | Status: AC | PRN
Start: 2023-05-31 — End: 2023-05-31
  Administered 2023-05-31: 2 mg via INTRAVITREAL

## 2023-07-21 NOTE — Progress Notes (Signed)
[facility] - Clinic Note  07/22/2023    CHIEF COMPLAINT Patient presents for Retina Follow Up  HISTORY OF PRESENT ILLNESS: Kaitlyn Parks is a 88 y.o. female who presents to the clinic today for:   HPI     Retina Follow Up   Patient presents with  Wet AMD.  In left eye.  This started 8 weeks ago.  Severity is moderate.  Duration of 8 weeks.  Since onset it is stable.  I, the attending physician,  performed the HPI with the patient and updated documentation appropriately.        Comments   Patient states the vision is the same. She is using AT's.       Last edited by Kaitlyn Chris, MD on 07/22/2023  2:17 PM.    Pt states vision is okay  Referring physician: Karie Georges, MD [address] 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   No current facility-administered medications for this visit. (Ophthalmic Drugs)   Current Outpatient Medications (Other)  Medication Sig   alum & mag hydroxide-simeth (MAALOX PLUS) 400-400-40 MG/5ML suspension Take 15 mLs by mouth every 6 (six) hours as needed for indigestion.   amLODipine (NORVASC) 2.5 MG tablet Take 1 tablet (2.5 mg total) by mouth daily.   aspirin EC 81 MG tablet Take 81 mg by mouth daily.   Biotin w/ Vitamins C & E (HAIR/SKIN/NAILS PO) Take 1 tablet by mouth daily.   Cranberry 400 MG CAPS Take 400 mg by mouth daily.   hydrALAZINE (APRESOLINE) 10 MG tablet FOR SBP >160 UP TO A TOTAL OF 4 TIMES A DAY. As  Needed   ibuprofen (ADVIL) 400 MG tablet Take 400 mg by mouth every 6 (six) hours as needed for mild pain.   isosorbide mononitrate (IMDUR) 60 MG 24 hr tablet TAKE 1 & 1/2 TABLETS A DAY.   meclizine (ANTIVERT) 25 MG tablet Take 1 tablet (25 mg total) by mouth 3 (three) times daily as needed for dizziness.   Multiple Vitamins-Calcium (ONE-A-DAY WOMENS PO) Take 1 tablet by mouth daily.    nitroGLYCERIN (NITROSTAT) 0.4 MG SL tablet DISSOLVE 1 TABLET UNDER TONGUE IF NEEDED FOR CHEST PAIN. MAY REPEAT IN 5 MINUTES FOR 3 DOSES.   NON FORMULARY Get steroid inject for SI joint every 12 wks with Kaitlyn Parks surgery spine   ondansetron (ZOFRAN-ODT) 4 MG disintegrating tablet Take 1 tablet (4 mg total) by mouth every 8 (eight) hours as needed for nausea or vomiting.   PRESCRIPTION MEDICATION Avastin eye injections given by Kaitlyn Parks due to macular degeneration . Every 7wks.   rosuvastatin (CRESTOR) 10 MG tablet TABLET ONE-HALF TABLET BY MOUTH DAILY   SYNTHROID 50 MCG tablet TAKE 1 TABLET EACH DAY. NEED APPOINTMENT FOR ADDITIONAL REFILLS   dicyclomine (BENTYL) 20 MG tablet Take 1 tablet (20 mg total) by mouth 2 (two) times daily.   No current facility-administered medications for this visit. (Other)   Facility-Administered Medications Ordered in Other Visits (Other)  Medication Route   technetium tetrofosmin (  TC-MYOVIEW) injection 32.9 millicurie Intravenous   REVIEW OF SYSTEMS: ROS   Positive for: Gastrointestinal, Genitourinary, Musculoskeletal, Cardiovascular, Eyes Negative for: Constitutional, Neurological, Skin, HENT, Endocrine, Respiratory, Psychiatric, Allergic/Imm, Heme/Lymph Last edited by Kaitlyn Parks, COT on 07/22/2023 12:47 PM.     ALLERGIES Allergies  Allergen Reactions   Acyclovir And Related Other (See Comments)    unknown   Ciprofloxacin Other (See Comments)    dizziness   Pravachol [Pravastatin Sodium] Other (See Comments)    cystitis   Sulfa Antibiotics      nausea   Zocor [Simvastatin] Other (See Comments)    cystitis   PAST MEDICAL HISTORY Past Medical History:  Diagnosis Date   Arthritis    DDD, scoliosis, sees Kaitlyn. Lovell Sheehan for this, uses norco very rarely for pain   Bradycardia 11/11/2017   CAD (coronary artery disease)    LAD stenting of a 90% lesion 2012   Cystocele    Educated about COVID-19 virus infection 12/06/2019   Elevated cholesterol    GERD (gastroesophageal reflux disease)    dx in work up 2016 for atypical CP at OSH   pt. denies   Heart murmur    Hypertensive retinopathy    OU   Hypothyroidism    Interstitial cystitis    sees Kaitlyn. [name]   Macular degeneration    OU   NSTEMI (non-ST elevated myocardial infarction) [facility])    Osteoporosis    Rectocele    S/P hip replacement, right 06/12/2017   Scoliosis    Thyroid disease    Hypothyroid   Urinary incontinence    USI   Uterine prolapse    Past Surgical History:  Procedure Laterality Date   BLADDER SUSPENSION  2011   CATARACT EXTRACTION Bilateral 2015   Kaitlyn. Elmer Picker   CORONARY ANGIOPLASTY WITH STENT PLACEMENT  04/21/2010   LAD 80%, RCA 30%, nl EF, s/p DES LAD   EYE SURGERY     LEFT HEART CATH AND CORONARY ANGIOGRAPHY N/A 06/14/2017   Procedure: LEFT HEART CATH AND CORONARY ANGIOGRAPHY;  Surgeon: Runell Gess, MD;  Location: [facility] INVASIVE [facility] LAB;  Service: Cardiovascular;  Laterality: N/A;   OOPHORECTOMY  2011   BSO   TOTAL HIP ARTHROPLASTY Right 06/10/2017   Procedure: RIGHT TOTAL HIP ARTHROPLASTY ANTERIOR APPROACH;  Surgeon: Samson Frederic, MD;  Location: [facility] ORS;  Service: Orthopedics;  Laterality: Right;  Needs RNFA   VAGINAL HYSTERECTOMY  2011   LAVH BSO; benign   FAMILY HISTORY Family History  Problem Relation Age of Onset   Hypertension Mother    Heart disease Mother    Heart disease Father    Heart disease Sister    Diabetes Sister    Uterine cancer Sister        mets to lungs   Lung cancer Sister    Scoliosis Sister    Heart disease Brother     Colon cancer Paternal Aunt 16   Breast cancer Paternal Aunt        Age 35's   Breast cancer Paternal Aunt    Leukemia Paternal Aunt    Lung cancer Paternal Grandfather        smoker   Arthritis Daughter    Breast cancer Cousin        Maternal 1st cousins-Age 3's   Esophageal cancer Neg Hx    Pancreatic cancer Neg Hx    Stomach cancer Neg Hx    SOCIAL HISTORY Social History   Tobacco Use   Smoking status: Never  Smokeless tobacco: Never  Vaping Use   Vaping status: Never Used  Substance Use Topics   Alcohol use: No    Comment: Rare   Drug use: No       OPHTHALMIC EXAM: Base Eye Exam     Visual Acuity (Snellen - Linear)       Right Left   Dist [place] 20/40 20/40   Dist ph  NI NI         Tonometry (Tonopen, 12:49 PM)       Right Left   Pressure 16 15         Pupils       Dark Light Shape React APD   Right 3 2 Round Brisk None   Left 3 2 Round Brisk None         Visual Fields       Left Right    Full Full         Extraocular Movement       Right Left    Full, Ortho Full, Ortho         Neuro/Psych     Oriented x3: Yes   Mood/Affect: Normal         Dilation     Both eyes: 1.0% Mydriacyl, 2.5% Phenylephrine @ 12:47 PM           Slit Lamp and Fundus Exam     Slit Lamp Exam       Right Left   Lids/Lashes mild Telangiectasia -- improved, marginal lesion nasal UL Dermatochalasis - upper lid   Conjunctiva/Sclera White and quiet White and quiet, inferior conj chalasis   Cornea Arcus, 2+ fine Punctate epithelial erosions, tear film debris, decreased TBUT arcus, tear film debris, 2+ inferior Punctate epithelial erosions   Anterior Chamber Deep and clear Deep and clear   Iris Round and dilated Round and well dilated   Lens PC IOL in good position, 1+Posterior capsular opacification IN PC IOL in good position with open PC   Anterior Vitreous Vitreous syneresis, Posterior vitreous detachment Vitreous syneresis, Posterior vitreous  detachment, vitreous condensations         Fundus Exam       Right Left   Disc Pink and Sharp, Compact, focal PPP Pink and Sharp, mild temporal PPA/PPP   C/D Ratio 0.3 0.3   Macula Blunted foveal reflex, Drusen, RPE mottling and clumping, early Atrophy, +PEDs -- stably improved, trace cystic changes -- stably improved, No frank heme, no edema Blunted foveal reflex, +drusen, pigment clumping, RPE mottling, clumping and atrophy, no heme, central PED / CNV with overlying cystic changes -- stably improved, +GA   Vessels attenuated, Tortuous attenuated, Tortuous   Periphery Attached, scattered reticular degeneration Attached, scattered reticular degeneration           IMAGING AND PROCEDURES  Imaging and Procedures for @TODAY @  OCT, Retina - OU - Both Eyes       Right Eye Quality was good. Central Foveal Thickness: 240. Progression has been stable. Findings include no IRF, no SRF, abnormal foveal contour, retinal drusen , intraretinal hyper-reflective material, pigment epithelial detachment, outer retinal atrophy (stable improvement in cystic changes nasal and temporal fovea, patchy ORA / GA -- no fluid).   Left Eye Quality was good. Central Foveal Thickness: 261. Progression has been stable. Findings include normal foveal contour, no IRF, no SRF, retinal drusen , intraretinal hyper-reflective material, pigment epithelial detachment, outer retinal atrophy (Stable improvement in IRF/cystic changes inferior fovea, stable improvement in [facility] /  PED inferior to fovea ).   Notes *Images captured and stored on drive  Diagnosis / Impression:  OD: exudative ARMD -- stable improvement in cystic changes nasal and temporal fovea, patchy ORA / GA -- no fluid OS: exu-ARMD -- Stable improvement in IRF/cystic changes inferior fovea, stable improvement in [facility] / PED inferior to fovea   Clinical management:  See below  Abbreviations: NFP - Normal foveal profile. CME - cystoid macular edema. PED -  pigment epithelial detachment. IRF - intraretinal fluid. SRF - subretinal fluid. EZ - ellipsoid zone. ERM - epiretinal membrane. ORA - outer retinal atrophy. ORT - outer retinal tubulation. SRHM - subretinal hyper-reflective material      Intravitreal Injection, Pharmacologic Agent - OS - Left Eye       Time Out 07/22/2023. 1:32 PM. Confirmed correct patient, procedure, site, and patient consented.   Anesthesia Topical anesthesia was used. Anesthetic medications included Lidocaine 2%, Proparacaine 0.5%.   Procedure Preparation included 5% betadine to ocular surface, eyelid speculum. A (32g) needle was used.   Injection: 2 mg aflibercept 2 MG/0.05ML   Route: Intravitreal, Site: Left Eye   NDC: L6038910, Lot: [id number], Expiration date: 11/29/2024, Waste: 0 mL   Post-op Post injection exam found visual acuity of at least counting fingers. The patient tolerated the procedure well. There were no complications. The patient received written and verbal post procedure care education. Post injection medications were not given.            ASSESSMENT/PLAN:    ICD-10-CM   1. Exudative age-related macular degeneration of left eye with active choroidal neovascularization (HCC)  H35.3221 OCT, Retina - OU - Both Eyes    Intravitreal Injection, Pharmacologic Agent - OS - Left Eye    aflibercept (EYLEA) SOLN 2 mg    2. Exudative age-related macular degeneration of right eye with active choroidal neovascularization (HCC)  H35.3211     3. Essential hypertension  I10     4. Hypertensive retinopathy of both eyes  H35.033     5. Pseudophakia of both eyes  Z96.1     6. Dry eyes  H04.123     7. Left posterior capsular opacification  H26.492      1. Exudative age-related macular degeneration, left eye   - OCT 4.30.2020 showed interval conversion of OS from nonexudative ARMD to exu-ARMD with large dome-shaped PED with overlying IRF/SRF  - FA 05.28.20 -- no active CNV OS, just  staining  - s/p IVA OS #1 (04.30.20), #2 (05.28.20), #3 (06.26.20), #4 (08.03.20), #5 (09.11.20), #6 (10.19.20), #7 (11.23.20),  - reactivation of CNV noted on 05.05.22 -- s/p IVA OS  #8 (05.05.22), #9 (06.06.22), #10 (07.19.22), #11 (09.13.22), #12 (10.25.22), #13 (12.13.22), #14 (01.30.23), #15 (3.20.23), #16 (05.08.23), #17 (06.19.23), #18 (08.07.23), #19 (10.02.23), #20 (11.20.23), #21 (01.15.24)  - s/p IVE OS #1 (03.11.24), #2 (04.22.24), #3 (05.29.24), #4 (07.22.24), #5 (09.09.24), #6 (10.28.24)  - **Interval increase in IRF overlying PED at 8 wks on 09.13.22 visit (IVA)**  - BCVA OS stable at 20/40 -- improved post-YAG  - OCT shows OS: Stable improvement in IRF/cystic changes inferior fovea, stable improvement in [facility] / PED inferior to fovea at 8 wks  - recommend IVE OS #7 today, 12.19.24 with follow up extended to 9 weeks  - pt wishes to proceed with injection  - RBA of procedure discussed, questions answered - see procedure note - IVE informed consent obtained and signed, 03.11.24  - benefits investigation for Eylea initiated 4.30.2020 -- approved  for 2024  - f/u in 9 weeks -- DFE/OCT, possible injection  2. Age related macular degeneration, exudative, OD  - interval development of IRF first noted on 09.11.20 -- conversion from nonexudative to exudative ARMD  - pt initially presented on 11.27.19 due to alert from [employer] home monitoring system for OD  - [facility] prescribed by Kaitlyn. Elmer Picker  - FA (5.28.2020) showed staining / window defect corresponding temporal RPE changes OU -- no active CNV OU  - S/P IVA OD #1 (09.11.20), #2 (10.19.20), #3 (11.23.20), #4 (01.07.21), #5 (2.19.21), #6 (04.02.21), #7 (05.28.21), #8 (07.23.21), #9 (09.24.21), #10 (11.29.21), #11 (02.11.22), #12 (05.05.22), #13 (7.19.22), #14 (09.13.22), #15 (10.02.23), #16 (11.20.23), #17 (01.15.24), #18 (03.11.24)  - OCT shows stable improvement in cystic changes nasal and temporal fovea, patchy ORA / GA at 4+ mos since last  injxn  - BCVA OD stable at 20/40  - recommend holding IVA OD today -- will treat PRN - IVA informed consent obtained and signed, 05.08.23 (OU) - see procedure note  - f/u 9 weeks DFE, OCT  3,4. Hypertensive retinopathy OU  - discussed importance of tight BP control  - monitor  5. Pseudophakia OU  - s/p CE/IOL OU  - beautiful surgeries, doing well  - monitor  6. Dry eyes OU  - recommend artificial tears and lubricating ointment as needed  7. PCO OU (OS > OD)  - s/p Yag Cap OS 07.31.24 - BCVA OS 20/40 and pleased with the result     Ophthalmic Meds Ordered this visit:  Meds ordered this encounter  Medications   aflibercept (EYLEA) SOLN 2 mg     Return in about 9 weeks (around 09/23/2023) for f/u exu ARMD OU, DFE, OCT.  There are no Patient Instructions on file for this visit.  This document serves as a record of services personally performed by Kaitlyn Chimera, MD, PhD. It was created on their behalf by Annalee Genta, COMT. The creation of this record is the provider's dictation and/or activities during the visit.  Electronically signed by: Annalee Genta, COMT 07/22/23 2:19 PM  This document serves as a record of services personally performed by Kaitlyn Chimera, MD, PhD. It was created on their behalf by Glee Arvin. Manson Passey, OA an ophthalmic technician. The creation of this record is the provider's dictation and/or activities during the visit.    Electronically signed by: Glee Arvin. Manson Passey, OA 07/22/23 2:19 PM  Kaitlyn Chimera, M.D., Ph.D. Diseases & Surgery of the Retina and Vitreous [facility]  I have reviewed the above documentation for accuracy and completeness, and I agree with the above. Kaitlyn Chimera, M.D., Ph.D. 07/22/23 2:20 PM  Abbreviations: M myopia (nearsighted); A astigmatism; H hyperopia (farsighted); P presbyopia; Mrx spectacle prescription;  CTL contact lenses; OD right eye; OS left eye; OU both eyes  XT exotropia; ET esotropia; PEK  punctate epithelial keratitis; PEE punctate epithelial erosions; DES dry eye syndrome; MGD meibomian gland dysfunction; ATs artificial tears; PFAT's preservative free artificial tears; NSC nuclear sclerotic cataract; PSC posterior subcapsular cataract; ERM epi-retinal membrane; PVD posterior vitreous detachment; RD retinal detachment; DM diabetes mellitus; Kaitlyn diabetic retinopathy; NPDR non-proliferative diabetic retinopathy; PDR proliferative diabetic retinopathy; CSME clinically significant macular edema; DME diabetic macular edema; dbh dot blot hemorrhages; CWS cotton wool spot; POAG primary open angle glaucoma; C/D cup-to-disc ratio; HVF humphrey visual field; GVF goldmann visual field; OCT optical coherence tomography; IOP intraocular pressure; BRVO Branch retinal vein occlusion; CRVO central retinal vein  occlusion; CRAO central retinal artery occlusion; BRAO branch retinal artery occlusion; RT retinal tear; SB scleral buckle; PPV pars plana vitrectomy; VH Vitreous hemorrhage; PRP panretinal laser photocoagulation; IVK intravitreal kenalog; VMT vitreomacular traction; MH Macular hole;  NVD neovascularization of the disc; NVE neovascularization elsewhere; AREDS age related eye disease study; ARMD age related macular degeneration; POAG primary open angle glaucoma; EBMD epithelial/anterior basement membrane dystrophy; ACIOL anterior chamber intraocular lens; IOL intraocular lens; PCIOL posterior chamber intraocular lens; Phaco/IOL phacoemulsification with intraocular lens placement; PRK photorefractive keratectomy; LASIK laser assisted in situ keratomileusis; HTN hypertension; DM diabetes mellitus; COPD chronic obstructive pulmonary disease

## 2023-07-22 ENCOUNTER — Ambulatory Visit (INDEPENDENT_AMBULATORY_CARE_PROVIDER_SITE_OTHER): Payer: Medicare Other | Admitting: Ophthalmology

## 2023-07-22 ENCOUNTER — Encounter (INDEPENDENT_AMBULATORY_CARE_PROVIDER_SITE_OTHER): Payer: Self-pay | Admitting: Ophthalmology

## 2023-07-22 DIAGNOSIS — H26492 Other secondary cataract, left eye: Secondary | ICD-10-CM

## 2023-07-22 DIAGNOSIS — H353231 Exudative age-related macular degeneration, bilateral, with active choroidal neovascularization: Secondary | ICD-10-CM | POA: Diagnosis not present

## 2023-07-22 DIAGNOSIS — I10 Essential (primary) hypertension: Secondary | ICD-10-CM | POA: Diagnosis not present

## 2023-07-22 DIAGNOSIS — H35033 Hypertensive retinopathy, bilateral: Secondary | ICD-10-CM | POA: Diagnosis not present

## 2023-07-22 DIAGNOSIS — Z961 Presence of intraocular lens: Secondary | ICD-10-CM | POA: Diagnosis not present

## 2023-07-22 DIAGNOSIS — H353221 Exudative age-related macular degeneration, left eye, with active choroidal neovascularization: Secondary | ICD-10-CM

## 2023-07-22 DIAGNOSIS — H04123 Dry eye syndrome of bilateral lacrimal glands: Secondary | ICD-10-CM

## 2023-07-22 DIAGNOSIS — H353211 Exudative age-related macular degeneration, right eye, with active choroidal neovascularization: Secondary | ICD-10-CM

## 2023-07-22 MED ORDER — AFLIBERCEPT 2MG/0.05ML IZ SOLN FOR KALEIDOSCOPE
2.0000 mg | INTRAVITREAL | Status: AC | PRN
  Administered 2023-07-22: 2 mg via INTRAVITREAL

## 2023-07-23 ENCOUNTER — Encounter (INDEPENDENT_AMBULATORY_CARE_PROVIDER_SITE_OTHER): Payer: Medicare Other | Admitting: Ophthalmology

## 2023-07-24 ENCOUNTER — Other Ambulatory Visit: Payer: Self-pay | Admitting: Family Medicine

## 2023-07-24 DIAGNOSIS — E039 Hypothyroidism, unspecified: Secondary | ICD-10-CM

## 2023-07-24 DIAGNOSIS — I10 Essential (primary) hypertension: Secondary | ICD-10-CM

## 2023-08-13 ENCOUNTER — Ambulatory Visit (INDEPENDENT_AMBULATORY_CARE_PROVIDER_SITE_OTHER): Payer: Medicare Other

## 2023-08-13 VITALS — Ht 63.0 in | Wt 122.0 lb

## 2023-08-13 DIAGNOSIS — Z0000 Encounter for general adult medical examination without abnormal findings: Secondary | ICD-10-CM | POA: Diagnosis not present

## 2023-08-13 NOTE — Progress Notes (Signed)
 Subjective:   Kaitlyn Parks is a 89 y.o. female who presents for Medicare Annual (Subsequent) preventive examination.  Visit Complete: Virtual I connected with  Kaitlyn Parks on 08/13/23 by a audio enabled telemedicine application and verified that I am speaking with the correct person using two identifiers.  Patient Location: Home  Provider Location: Home Office  I discussed the limitations of evaluation and management by telemedicine. The patient expressed understanding and agreed to proceed.  Vital Signs: Because this visit was a virtual/telehealth visit, some criteria may be missing or patient reported. Any vitals not documented were not able to be obtained and vitals that have been documented are patient reported.  Patient Medicare AWV questionnaire was completed by the patient on 08/13/23; I have confirmed that all information answered by patient is correct and no changes since this date.  Cardiac Risk Factors include: advanced age (>62men, >2 women);hypertension     Objective:    Today's Vitals   08/13/23 1005  Weight: 122 lb (55.3 kg)  Height: 5' 3 (1.6 m)   Body mass index is 21.61 kg/m.     08/13/2023   10:13 AM 08/06/2022   10:16 AM 06/13/2022    2:20 PM 06/12/2022    8:59 PM 04/23/2022   12:52 AM 03/03/2022   12:32 PM 08/06/2021   10:12 AM  Advanced Directives  Does Patient Have a Medical Advance Directive? Yes Yes No No No Yes Yes  Type of Estate Agent of [place];Living will Healthcare Power of [place];Living will    Healthcare Power of [place];Living will Healthcare Power of [place];Living will  Does patient want to make changes to medical advance directive?  No - Patient declined     No - Patient declined  Copy of Healthcare Power of Attorney in Chart? No - copy requested Yes - validated most recent copy scanned in chart (See row information)    No - copy requested No - copy requested  Would patient like information on creating a  medical advance directive?   No - Patient declined No - Patient declined       Current Medications (verified) Outpatient Encounter Medications as of 08/13/2023  Medication Sig   alum & mag hydroxide-simeth (MAALOX PLUS) 400-400-40 MG/5ML suspension Take 15 mLs by mouth every 6 (six) hours as needed for indigestion.   amLODipine  (NORVASC ) 2.5 MG tablet Take 1 tablet (2.5 mg total) by mouth daily.   aspirin  EC 81 MG tablet Take 81 mg by mouth daily.   Biotin w/ Vitamins C & E (HAIR/SKIN/NAILS PO) Take 1 tablet by mouth daily.   Cranberry 400 MG CAPS Take 400 mg by mouth daily.   hydrALAZINE  (APRESOLINE ) 10 MG tablet FOR SBP >160 UP TO A TOTAL OF 4 TIMES A DAY. As Needed   ibuprofen (ADVIL) 400 MG tablet Take 400 mg by mouth every 6 (six) hours as needed for mild pain.   isosorbide  mononitrate (IMDUR ) 60 MG 24 hr tablet TAKE 1 & 1/2 TABLETS A DAY.   meclizine  (ANTIVERT ) 25 MG tablet Take 1 tablet (25 mg total) by mouth 3 (three) times daily as needed for dizziness.   Multiple Vitamins-Calcium  (ONE-A-DAY WOMENS PO) Take 1 tablet by mouth daily.    nitroGLYCERIN  (NITROSTAT ) 0.4 MG SL tablet DISSOLVE 1 TABLET UNDER TONGUE IF NEEDED FOR CHEST PAIN. MAY REPEAT IN 5 MINUTES FOR 3 DOSES.   NON FORMULARY Get steroid inject for SI joint every 12 wks with Dr. Darlis Leash surgery spine  ondansetron  (ZOFRAN -ODT) 4 MG disintegrating tablet Take 1 tablet (4 mg total) by mouth every 8 (eight) hours as needed for nausea or vomiting.   Polyethyl Glycol-Propyl Glycol (SYSTANE) 0.4-0.3 % GEL ophthalmic gel Place 1 Application into both eyes daily as needed (dry eyes).   PRESCRIPTION MEDICATION Avastin  eye injections given by Dr Valdemar due to macular degeneration . Every 7wks.   rosuvastatin  (CRESTOR ) 10 MG tablet TABLET ONE-HALF TABLET BY MOUTH DAILY   SYNTHROID  50 MCG tablet TAKE 1 TABLET EACH DAY. NEED APPOINTMENT FOR ADDITIONAL REFILLS   [DISCONTINUED] dicyclomine  (BENTYL ) 20 MG tablet Take 1 tablet (20 mg  total) by mouth 2 (two) times daily.   Facility-Administered Encounter Medications as of 08/13/2023  Medication   technetium tetrofosmin  (TC-MYOVIEW ) injection 32.9 millicurie    Allergies (verified) Acyclovir and related, Ciprofloxacin , Pravachol  [pravastatin  sodium], Sulfa  antibiotics, and Zocor  [simvastatin ]   History: Past Medical History:  Diagnosis Date   Arthritis    DDD, scoliosis, sees Dr. Mavis for this, uses norco very rarely for pain   Bradycardia 11/11/2017   CAD (coronary artery disease)    LAD stenting of a 90% lesion 2012   Cystocele    Educated about COVID-19 virus infection 12/06/2019   Elevated cholesterol    GERD (gastroesophageal reflux disease)    dx in work up 2016 for atypical CP at OSH   pt. denies   Heart murmur    Hypertensive retinopathy    OU   Hypothyroidism    Interstitial cystitis    sees Dr. Watt   Macular degeneration    OU   NSTEMI (non-ST elevated myocardial infarction) [facility])    Osteoporosis    Rectocele    S/P hip replacement, right 06/12/2017   Scoliosis    Thyroid  disease    Hypothyroid   Urinary incontinence    USI   Uterine prolapse    Past Surgical History:  Procedure Laterality Date   BLADDER SUSPENSION  2011   CATARACT EXTRACTION Bilateral 2015   Dr. Cleatus   CORONARY ANGIOPLASTY WITH STENT PLACEMENT  04/21/2010   LAD 80%, RCA 30%, nl EF, s/p DES LAD   EYE SURGERY     LEFT HEART CATH AND CORONARY ANGIOGRAPHY N/A 06/14/2017   Procedure: LEFT HEART CATH AND CORONARY ANGIOGRAPHY;  Surgeon: Court Dorn PARAS, MD;  Location: [facility] INVASIVE [facility];  Service: Cardiovascular;  Laterality: N/A;   OOPHORECTOMY  2011   BSO   TOTAL HIP ARTHROPLASTY Right 06/10/2017   Procedure: RIGHT TOTAL HIP ARTHROPLASTY ANTERIOR APPROACH;  Surgeon: Fidel Rogue, MD;  Location: [facility] OR[facility];  Service: Orthopedics;  Laterality: Right;  Needs RNFA   VAGINAL HYSTERECTOMY  2011   LAVH BSO; benign   Family History  Problem Relation Age of Onset    Hypertension Mother    Heart disease Mother    Heart disease Father    Heart disease Sister    Diabetes Sister    Uterine cancer Sister        mets to lungs   Lung cancer Sister    Scoliosis Sister    Heart disease Brother    Colon cancer Paternal Aunt 53   Breast cancer Paternal Aunt        Age 68's   Breast cancer Paternal Aunt    Leukemia Paternal Aunt    Lung cancer Paternal Grandfather        smoker   Arthritis Daughter    Breast cancer Cousin        Maternal  1st cousins-Age 11's   Esophageal cancer Neg Hx    Pancreatic cancer Neg Hx    Stomach cancer Neg Hx    Social History   Socioeconomic History   Marital status: Widowed    Spouse name: Not on file   Number of children: 2   Years of education: Not on file   Highest education level: Associate degree: occupational, scientist, product/process development, or vocational program  Occupational History   Occupation: retired  Tobacco Use   Smoking status: Never   Smokeless tobacco: Never  Vaping Use   Vaping status: Never Used  Substance and Sexual Activity   Alcohol  use: No    Comment: Rare   Drug use: No   Sexual activity: Never    Birth control/protection: Surgical  Other Topics Concern   Not on file  Social History Narrative   Work or School:  none      Home Situation: lives with husband      Spiritual Beliefs: Methodist      Lifestyle: regular exercise (yoga, water  aerobics); diet is healthy      Social Drivers of Corporate Investment Banker Strain: Low Risk  (08/13/2023)   Overall Financial Resource Strain (CARDIA)    Difficulty of Paying Living Expenses: Not hard at all  Food Insecurity: No Food Insecurity (08/13/2023)   Hunger Vital Sign    Worried About Running Out of Food in the Last Year: Never true    Ran Out of Food in the Last Year: Never true  Transportation Needs: No Transportation Needs (08/13/2023)   PRAPARE - Administrator, Civil Service (Medical): No    Lack of Transportation (Non-Medical): No   Physical Activity: Sufficiently Active (08/13/2023)   Exercise Vital Sign    Days of Exercise per Week: 3 days    Minutes of Exercise per Session: 60 min  Stress: No Stress Concern Present (08/13/2023)   [employer] of Occupational Health - Occupational Stress Questionnaire    Feeling of Stress : Not at all  Social Connections: Moderately Integrated (08/13/2023)   Social Connection and Isolation Panel [NHANES]    Frequency of Communication with Friends and Family: More than three times a week    Frequency of Social Gatherings with Friends and Family: More than three times a week    Attends Religious Services: More than 4 times per year    Active Member of [employer] or Organizations: Yes    Attends Banker Meetings: More than 4 times per year    Marital Status: Widowed    Tobacco Counseling Counseling given: Not Answered   Clinical Intake:  Pre-visit preparation completed: Yes  Pain : No/denies pain     BMI - recorded: 21.61 Nutritional Status: BMI of 19-24  Normal Nutritional Risks: None Diabetes: No  How often do you need to have someone help you when you read instructions, pamphlets, or other written materials from your doctor or pharmacy?: 1 - Never  Interpreter Needed?: No  Information entered by :: Rojelio Blush LPN   Activities of Daily Living    08/13/2023   10:10 AM 08/13/2023    7:25 AM  In your present state of health, do you have any difficulty performing the following activities:  Hearing? 0 1  Vision? 0 1  Difficulty concentrating or making decisions? 0 0  Walking or climbing stairs? 0 0  Dressing or bathing? 0   Doing errands, shopping? 0 0  Preparing Food and eating ? N N  Using the Toilet? N N  In the past six months, have you accidently leaked urine? Y   Comment Wears pads. Followed by medical attention   Do you have problems with loss of bowel control? N Y  Managing your Medications? N N  Managing your Finances? N N   Housekeeping or managing your Housekeeping? N N    Patient Care Team: [name] HERO, MD as PCP - General (Family Medicine) Lavona Agent, MD as PCP - Cardiology (Cardiology) Trixie File, MD as Consulting Physician (Internal Medicine) Mavis Purchase, MD as Consulting Physician (Neurosurgery) Watt Rush, MD as Attending Physician (Urology) Duke, Jon Garre, PA as Physician Assistant (Cardiology)  Indicate any recent Medical Services you may have received from other than [facility] providers in the past year (date may be approximate).     Assessment:   This is a routine wellness examination for Noell.  Hearing/Vision screen Hearing Screening - Comments:: Denies hearing difficulties   Vision Screening - Comments:: Wears reading glasses - up to date with routine eye exams with  Dr Cleatus   Goals Addressed               This Visit's Progress     Stay Active! (pt-stated)         Depression Screen    08/13/2023   10:09 AM 02/18/2023    1:57 PM 08/06/2022   10:14 AM 08/13/2021   10:32 AM 08/06/2021   10:05 AM 09/11/2020    9:03 AM 03/11/2020   10:02 AM  PHQ 2/9 Scores  PHQ - 2 Score 0 0 0 0 0 0 0  PHQ- 9 Score  0  4       Fall Risk    08/13/2023   10:12 AM 08/13/2023    7:25 AM 02/18/2023    1:57 PM 01/22/2023    3:56 PM 10/26/2022    2:10 PM  Fall Risk   Falls in the past year? 0 0 0 0 0  Number falls in past yr: 0 0 0 0   Injury with Fall? 0 0 0 0   Risk for fall due to : No Fall Risks  No Fall Risks No Fall Risks   Follow up Falls prevention discussed  Falls evaluation completed Falls evaluation completed     MEDICARE RISK AT HOME: Medicare Risk at Home Any stairs in or around the home?: (Patient-Rptd) Yes If so, are there any without handrails?: (Patient-Rptd) No Home free of loose throw rugs in walkways, pet beds, electrical cords, etc?: (Patient-Rptd) Yes Adequate lighting in your home to reduce risk of falls?: (Patient-Rptd) Yes Life alert?:  (Patient-Rptd) No Use of a cane, walker or w/c?: (Patient-Rptd) No Grab bars in the bathroom?: (Patient-Rptd) No Shower chair or bench in shower?: (Patient-Rptd) No Elevated toilet seat or a handicapped toilet?: (Patient-Rptd) No  TIMED UP AND GO:  Was the test performed?  No    Cognitive Function:    11/30/2017    9:41 AM 08/11/2016    2:07 PM  MMSE - Mini Mental State Exam  Not completed: -- --        08/13/2023   10:13 AM 08/06/2022   10:18 AM 08/06/2021   10:11 AM  6CIT Screen  What Year? 0 points 0 points 0 points  What month? 0 points 0 points 0 points  What time? 0 points 0 points 0 points  Count back from 20 0 points 0 points 0 points  Months in reverse 0 points 0  points 0 points  Repeat phrase 0 points 0 points 0 points  Total Score 0 points 0 points 0 points    Immunizations Immunization History  Administered Date(s) Administered   Influenza Whole 08/11/2007   Influenza, High Dose Seasonal PF 08/06/2014, 04/30/2017, 06/20/2018, 04/17/2019, 05/07/2021   Influenza,inj,Quad PF,6+ Mos 04/24/2013   Influenza-Unspecified 06/03/2016, 05/04/2019, 04/03/2020, 04/24/2022, 04/04/2023   PFIZER Comirnaty(Gray Top)Covid-19 Tri-Sucrose Vaccine 05/02/2022, 12/03/2022   PFIZER(Purple Top)SARS-COV-2 Vaccination 08/13/2019, 09/02/2019, 11/02/2019   Pfizer Covid-19 Vaccine Bivalent Booster 66yrs & up 05/07/2021   Pneumococcal Conjugate-13 02/19/2014   Pneumococcal Polysaccharide-23 09/13/2007   Rsv, Bivalent, Protein Subunit Rsvpref,pf [place]) 09/11/2022   Td 11/04/1995, 09/13/2007   Tdap 03/04/2018   Zoster Recombinant(Shingrix) 03/04/2018, 05/10/2018    TDAP status: Up to date  Flu Vaccine status: Up to date  Pneumococcal vaccine status: Up to date  Covid-19 vaccine status: Declined, Education has been provided regarding the importance of this vaccine but patient still declined. Advised may receive this vaccine at local pharmacy or Health Dept.or vaccine clinic. Aware to  provide a copy of the vaccination record if obtained from local pharmacy or Health Dept. Verbalized acceptance and understanding.  Qualifies for Shingles Vaccine? Yes   Zostavax completed Yes   Shingrix Completed?: Yes  Screening Tests Health Maintenance  Topic Date Due   COVID-19 Vaccine (7 - 2024-25 season) 04/04/2023   MAMMOGRAM  10/21/2023   Medicare Annual Wellness (AWV)  08/12/2024   DTaP/Tdap/Td (4 - Td or Tdap) 03/04/2028   Pneumonia Vaccine 72+ Years old  Completed   INFLUENZA VACCINE  Completed   DEXA SCAN  Completed   Zoster Vaccines- Shingrix  Completed   HPV VACCINES  Aged Out    Health Maintenance  Health Maintenance Due  Topic Date Due   COVID-19 Vaccine (7 - 2024-25 season) 04/04/2023      Mammogram status: Completed 10/21/22. Repeat every year  Bone Density status: Completed 09/28/18. Results reflect: Bone density results: OSTEOPOROSIS. Repeat every   years.     Additional Screening:    Vision Screening: Recommended annual ophthalmology exams for early detection of glaucoma and other disorders of the eye. Is the patient up to date with their annual eye exam?  Yes  Who is the provider or what is the name of the office in which the patient attends annual eye exams? Dr Cleatus If pt is not established with a provider, would they like to be referred to a provider to establish care? No .   Dental Screening: Recommended annual dental exams for proper oral hygiene    Community Resource Referral / Chronic Care Management:  CRR required this visit?  No   CCM required this visit?  No     Plan:     I have personally reviewed and noted the following in the patient's chart:   Medical and social history Use of alcohol , tobacco or illicit drugs  Current medications and supplements including opioid prescriptions. Patient is not currently taking opioid prescriptions. Functional ability and status Nutritional status Physical activity Advanced  directives List of other physicians Hospitalizations, surgeries, and ER visits in previous 12 months Vitals Screenings to include cognitive, depression, and falls Referrals and appointments  In addition, I have reviewed and discussed with patient certain preventive protocols, quality metrics, and best practice recommendations. A written personalized care plan for preventive services as well as general preventive health recommendations were provided to patient.     JAHMIYA GUIDOTTI, LPN   8/89/7974   After Visit  Summary: (MyChart) Due to this being a telephonic visit, the after visit summary with patients personalized plan was offered to patient via MyChart   Nurse Notes: None

## 2023-08-13 NOTE — Patient Instructions (Addendum)
 Ms. Newhouse , Thank you for taking time to come for your Medicare Wellness Visit. I appreciate your ongoing commitment to your health goals. Please review the following plan we discussed and let me know if I can assist you in the future.   Referrals/Orders/Follow-Ups/Clinician Recommendations:   This is a list of the screening recommended for you and due dates:  Health Maintenance  Topic Date Due   COVID-19 Vaccine (7 - 2024-25 season) 04/04/2023   Mammogram  10/21/2023   Medicare Annual Wellness Visit  08/12/2024   DTaP/Tdap/Td vaccine (4 - Td or Tdap) 03/04/2028   Pneumonia Vaccine  Completed   Flu Shot  Completed   DEXA scan (bone density measurement)  Completed   Zoster (Shingles) Vaccine  Completed   HPV Vaccine  Aged Out    Advanced directives: (Copy Requested) Please bring a copy of your health care power of attorney and living will to the office to be added to your chart at your convenience.  Next Medicare Annual Wellness Visit scheduled for next year: Yes

## 2023-08-26 NOTE — Progress Notes (Signed)
Cardiology Office Note:   Date:  08/27/2023  ID:  Kaitlyn, Parks 03/06/1935, MRN [id number] PCP: Karie Georges, MD  [facility] Providers Cardiologist:  Rollene Rotunda, MD Cardiology APP:  Marcelino Duster, [place] {  History of Present Illness:   Kaitlyn Parks is a 89 y.o. female with hypothyroidism, HTN, HLD and CAD. She had a PCI of LAD in 2012. CTA of the chest in Nov 2018 was negative for PE however troponin was elevated at 0.42. Echocardiogram obtained on 06/13/2017 showed EF 60-65%, mild LVH, grade 1 DD, mild MR, mild TR, peak PA pressure 30 mmHg. Cardiac catheterization performed on 06/14/2017 showed 95% ostial D1 lesion, widely patent ostial LAD stent, 30% proximal LAD stenosis. Medical therapy was recommended. Imdur 60 mg daily was added to her medical regimen. Beta blocker was initially increased and later reduced and was eventually discontinued due to symptomatic bradycardia. She is on PRN hydralazine for blood pressure spike. Patient was most recently seen by Dr. Antoine Poche on 11/07/2019 at which time she was doing well.  Abdominal ultrasound obtained on 12/13/2019 was negative for AAA.  I saw the patient in October 2021 at which time she had dizzy spell and the chest tightness and shortness of breath. Orthostatic vitals at the time showed systolic blood pressure dropping from 181 lying down to 159 standing.  Myoview in October 2021 showed EF 61%, overall low risk study without significant ischemia.  Echocardiogram obtained in March 2023 showed EF 61%, grade 1 DD, RVSP 19.5 mmHg, normal RV, mild to moderate MR, mild AI.  Repeat Myoview in March 2023 shows normal perfusion, EF 64%.     She was patient was seen at [facility], ED on 02/13/2023 for dizziness.  Patient recently had diarrhea and was taking Cipro. CT of the head and neck showed no acute intracranial process or large vessel occlusion, beading of mid to distal bilateral internal carotid arteries concerning for  fibromuscular dysplasia.  MRI of the brain was normal.  EKG shows sinus bradycardia with PACs.   Patient presents today for follow-up.  She has done well since I last saw her.  She goes to water aerobics and exercises couple times a week.  She walks up stairs routinely.  The patient denies any new symptoms such as chest discomfort, neck or arm discomfort. There has been no new shortness of breath, PND or orthopnea. There have been no reported palpitations, presyncope or syncope.   ROS: As stated in the HPI and negative for all other systems.  Studies Reviewed:    EKG:   NA    EKG from October demonstrated normal sinus rhythm with premature atrial contractions at a rate of 52  Risk Assessment/Calculations:              Physical Exam:   VS:  BP 118/78 (Cuff Size: Normal)   Pulse (!) 50   Ht 5\' 3"  (1.6 m)   Wt 129 lb 6.4 oz (58.7 kg)   SpO2 94%   BMI 22.92 kg/m    Wt Readings from Last 3 Encounters:  08/27/23 129 lb 6.4 oz (58.7 kg)  08/13/23 122 lb (55.3 kg)  05/26/23 127 lb (57.6 kg)     GEN: Well nourished, well developed in no acute distress NECK: No JVD; No carotid bruits CARDIAC: RRR, 2 out of 6 brief apical systolic murmur radiating slightly to the axilla, no diastolic murmurs, rubs, gallops RESPIRATORY:  Clear to auscultation without rales, wheezing or rhonchi  ABDOMEN: Soft, non-tender, non-distended EXTREMITIES:  No edema; No deformity   ASSESSMENT AND PLAN:   CAD: She has had no new symptoms since her stress perfusion study in 2023.  No change in therapy.  Hypertension:   Her BP is labile.  I reviewed her blood pressure diary and  a couple of times a week she has to take hydralazine.  However, this is really a stable pattern and no change in therapy is indicated.  She will let me know if she has to start using that more at which point we might need to change her blood pressure medicines.  She has been sensitive to medications so I am not wanting to over prescribe.    Hyperlipidemia: LDL was 61 on rosuvastatin.  Continue the meds as listed.  Asymptomatic bradycardia:   She tolerates a lower heart rate.  No change in therapy.     Follow up with Joni Reining in six months for routine follow up.      Signed, Rollene Rotunda, MD

## 2023-08-27 ENCOUNTER — Encounter: Payer: Self-pay | Admitting: Cardiology

## 2023-08-27 ENCOUNTER — Ambulatory Visit: Payer: Medicare Other | Attending: Cardiology | Admitting: Cardiology

## 2023-08-27 VITALS — BP 118/78 | HR 50 | Ht 63.0 in | Wt 129.4 lb

## 2023-08-27 DIAGNOSIS — I10 Essential (primary) hypertension: Secondary | ICD-10-CM | POA: Diagnosis not present

## 2023-08-27 DIAGNOSIS — E785 Hyperlipidemia, unspecified: Secondary | ICD-10-CM | POA: Diagnosis not present

## 2023-08-27 DIAGNOSIS — R001 Bradycardia, unspecified: Secondary | ICD-10-CM | POA: Diagnosis not present

## 2023-08-27 DIAGNOSIS — I2510 Atherosclerotic heart disease of native coronary artery without angina pectoris: Secondary | ICD-10-CM | POA: Diagnosis not present

## 2023-08-27 NOTE — Patient Instructions (Signed)
Medication Instructions:  No changes. *If you need a refill on your cardiac medications before your next appointment, please call your pharmacy*   Follow-Up: At [facility], you and your health needs are our priority.  As part of our continuing mission to provide you with exceptional heart care, we have created designated Provider Care Teams.  These Care Teams include your primary Cardiologist (physician) and Advanced Practice Providers (APPs -  Physician Assistants and Nurse Practitioners) who all work together to provide you with the care you need, when you need it.  We recommend signing up for the patient portal called "MyChart".  Sign up information is provided on this After Visit Summary.  MyChart is used to connect with patients for Virtual Visits (Telemedicine).  Patients are able to view lab/test results, encounter notes, upcoming appointments, etc.  Non-urgent messages can be sent to your provider as well.   To learn more about what you can do with MyChart, go to [email or web address].    Your next appointment:   6 month(s)  Provider:   Joni Reining, DNP, ANP

## 2023-09-08 ENCOUNTER — Other Ambulatory Visit: Payer: Self-pay | Admitting: Cardiology

## 2023-09-21 NOTE — Progress Notes (Signed)
 [facility] - Clinic Note  09/24/2023    CHIEF COMPLAINT Patient presents for Retina Follow Up  HISTORY OF PRESENT ILLNESS: Kaitlyn Parks is a 89 y.o. female who presents to the clinic today for:   HPI     Retina Follow Up   Patient presents with  Wet AMD.  In both eyes.  This started 9 weeks ago.  I, the attending physician,  performed the HPI with the patient and updated documentation appropriately.        Comments   Patient here for 9 weeks retina follow up for exu ARMD OU. Patient states vision about the same. No eye pain. Has very dry eyes. Saw Dr Bascom Levels on Feb 17 th. Suggested she ge a different drop Lumify. She uses 6 times a day and started using Miebo again.      Last edited by Rennis Chris, MD on 09/24/2023  4:28 PM.     Referring physician: Karie Georges, MD [address] [place],  [place] [address]  HISTORICAL INFORMATION:  Selected notes from the MEDICAL RECORD NUMBER Referred by Dr. [place] DeMarco for concern of exu ARMD LEE: 11.26.19 (J. 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   Perfluorohexyloctane (MIEBO OP) Apply 1 drop to eye daily.   Polyethyl Glycol-Propyl Glycol (SYSTANE) 0.4-0.3 % GEL ophthalmic gel Place 1 Application into both eyes daily as needed (dry eyes).   No current facility-administered medications for this visit. (Ophthalmic Drugs)   Current Outpatient Medications (Other)  Medication Sig   alum & mag hydroxide-simeth (MAALOX PLUS) 400-400-40 MG/5ML suspension Take 15 mLs by mouth every 6 (six) hours as needed for indigestion.   amLODipine (NORVASC) 2.5 MG tablet Take 1 tablet (2.5 mg total) by mouth daily.   aspirin EC 81 MG tablet Take  81 mg by mouth daily.   Biotin w/ Vitamins C & E (HAIR/SKIN/NAILS PO) Take 1 tablet by mouth daily.   Cranberry 400 MG CAPS Take 400 mg by mouth daily.   hydrALAZINE (APRESOLINE) 10 MG tablet TAKE (1) TABLET TWICE A DAY. MAY TAKE EXTRA DOSE FOR SBP >160 UP TO A TOTAL OF 4 TIMES A DAY AS NEEDED   ibuprofen (ADVIL) 400 MG tablet Take 400 mg by mouth every 6 (six) hours as needed for mild pain.   isosorbide mononitrate (IMDUR) 60 MG 24 hr tablet TAKE 1 & 1/2 TABLETS A DAY.   meclizine (ANTIVERT) 25 MG tablet Take 1 tablet (25 mg total) by mouth 3 (three) times daily as needed for dizziness.   Multiple Vitamins-Calcium (ONE-A-DAY WOMENS PO) Take 1 tablet by mouth daily.    nitroGLYCERIN (NITROSTAT) 0.4 MG SL tablet DISSOLVE 1 TABLET UNDER TONGUE IF NEEDED FOR CHEST PAIN. MAY REPEAT IN 5 MINUTES FOR 3 DOSES.   NON FORMULARY Get steroid inject for SI joint every 12 wks with Dr. Smitty Pluck surgery spine   PRESCRIPTION MEDICATION Avastin eye injections given by Dr Vanessa Barbara due to macular degeneration . Every 7wks.   rosuvastatin (CRESTOR) 10 MG tablet TABLET ONE-HALF TABLET BY MOUTH DAILY   SYNTHROID 50 MCG tablet TAKE 1 TABLET EACH DAY. NEED APPOINTMENT FOR ADDITIONAL REFILLS   No current facility-administered medications for this visit. (Other)   Facility-Administered Medications Ordered  in Other Visits (Other)  Medication Route   technetium tetrofosmin (TC-MYOVIEW) injection 32.9 millicurie Intravenous   REVIEW OF SYSTEMS: ROS   Positive for: Gastrointestinal, Genitourinary, Musculoskeletal, Cardiovascular, Eyes Negative for: Constitutional, Neurological, Skin, HENT, Endocrine, Respiratory, Psychiatric, Allergic/Imm, Heme/Lymph Last edited by Laddie Aquas, COA on 09/24/2023  1:02 PM.     ALLERGIES Allergies  Allergen Reactions   Acyclovir And Related Other (See Comments)    unknown   Ciprofloxacin Other (See Comments)    dizziness   Pravachol [Pravastatin Sodium] Other (See  Comments)    cystitis   Sulfa Antibiotics     nausea   Zocor [Simvastatin] Other (See Comments)    cystitis   PAST MEDICAL HISTORY Past Medical History:  Diagnosis Date   Arthritis    DDD, scoliosis, sees Dr. Lovell Sheehan for this, uses norco very rarely for pain   Bradycardia 11/11/2017   CAD (coronary artery disease)    LAD stenting of a 90% lesion 2012   Cystocele    Educated about COVID-19 virus infection 12/06/2019   Elevated cholesterol    GERD (gastroesophageal reflux disease)    dx in work up 2016 for atypical CP at OSH   pt. denies   Heart murmur    Hypertensive retinopathy    OU   Hypothyroidism    Interstitial cystitis    sees Dr. [name]   Macular degeneration    OU   NSTEMI (non-ST elevated myocardial infarction) [facility])    Osteoporosis    Rectocele    S/P hip replacement, right 06/12/2017   Scoliosis    Thyroid disease    Hypothyroid   Urinary incontinence    USI   Uterine prolapse    Past Surgical History:  Procedure Laterality Date   BLADDER SUSPENSION  2011   CATARACT EXTRACTION Bilateral 2015   Dr. Elmer Picker   CORONARY ANGIOPLASTY WITH STENT PLACEMENT  04/21/2010   LAD 80%, RCA 30%, nl EF, s/p DES LAD   EYE SURGERY     LEFT HEART CATH AND CORONARY ANGIOGRAPHY N/A 06/14/2017   Procedure: LEFT HEART CATH AND CORONARY ANGIOGRAPHY;  Surgeon: Runell Gess, MD;  Location: [facility];  Service: Cardiovascular;  Laterality: N/A;   OOPHORECTOMY  2011   BSO   TOTAL HIP ARTHROPLASTY Right 06/10/2017   Procedure: RIGHT TOTAL HIP ARTHROPLASTY ANTERIOR APPROACH;  Surgeon: Samson Frederic, MD;  Location: [facility];  Service: Orthopedics;  Laterality: Right;  Needs RNFA   VAGINAL HYSTERECTOMY  2011   LAVH BSO; benign   FAMILY HISTORY Family History  Problem Relation Age of Onset   Hypertension Mother    Heart disease Mother    Heart disease Father    Heart disease Sister    Diabetes Sister    Uterine cancer Sister        mets to lungs   Lung cancer Sister     Scoliosis Sister    Heart disease Brother    Colon cancer Paternal Aunt 65   Breast cancer Paternal Aunt        Age 56's   Breast cancer Paternal Aunt    Leukemia Paternal Aunt    Lung cancer Paternal Grandfather        smoker   Arthritis Daughter    Breast cancer Cousin        Maternal 1st cousins-Age 41's   Esophageal cancer Neg Hx    Pancreatic cancer Neg Hx    Stomach cancer Neg Hx    SOCIAL  HISTORY Social History   Tobacco Use   Smoking status: Never   Smokeless tobacco: Never  Vaping Use   Vaping status: Never Used  Substance Use Topics   Alcohol use: No    Comment: Rare   Drug use: No       OPHTHALMIC EXAM: Base Eye Exam     Visual Acuity (Snellen - Linear)       Right Left   Dist [place] 20/25 -2 20/30 -2         Tonometry (Tonopen, 12:59 PM)       Right Left   Pressure 11 11         Pupils       Dark Light Shape React APD   Right 3 2 Round Brisk None   Left 3 2 Round Brisk None         Visual Fields (Counting fingers)       Left Right    Full Full         Extraocular Movement       Right Left    Full, Ortho Full, Ortho         Neuro/Psych     Oriented x3: Yes   Mood/Affect: Normal         Dilation     Both eyes: 1.0% Mydriacyl, 2.5% Phenylephrine @ 12:59 PM           Slit Lamp and Fundus Exam     Slit Lamp Exam       Right Left   Lids/Lashes mild Telangiectasia -- improved, marginal lesion nasal UL Dermatochalasis - upper lid   Conjunctiva/Sclera White and quiet White and quiet, inferior conj chalasis   Cornea Arcus, 2+ fine Punctate epithelial erosions, tear film debris, decreased TBUT arcus, tear film debris, 2+ inferior Punctate epithelial erosions   Anterior Chamber Deep and clear Deep and clear   Iris Round and dilated Round and well dilated   Lens PC IOL in good position, 1+Posterior capsular opacification IN PC IOL in good position with open PC   Anterior Vitreous Vitreous syneresis, Posterior  vitreous detachment Vitreous syneresis, Posterior vitreous detachment, vitreous condensations         Fundus Exam       Right Left   Disc Pink and Sharp, Compact, focal PPP Pink and Sharp, mild temporal PPA/PPP   C/D Ratio 0.3 0.3   Macula Blunted foveal reflex, Drusen, RPE mottling and clumping, early Atrophy, +PEDs -- stably improved, trace cystic changes -- stably improved, No frank heme, no edema Blunted foveal r eflex, +drusen, pigment clumping, RPE mottling, clumping and atrophy, no heme, central PED / CNV with overlying cystic changes -- stably improved, +GA   Vessels attenuated, Tortuous attenuated, Tortuous   Periphery Attached, scattered reticular degeneration Attached, scattered reticular degeneration           IMAGING AND PROCEDURES  Imaging and Procedures for @TODAY @  OCT, Retina - OU - Both Eyes       Right Eye Quality was good. Central Foveal Thickness: 234. Progression has been stable. Findings include no IRF, no SRF, abnormal foveal contour, retinal drusen , intraretinal hyper-reflective material, pigment epithelial detachment, outer retinal atrophy (stable improvement in cystic changes nasal and temporal fovea, patchy ORA / GA -- no fluid).   Left Eye Quality was good. Central Foveal Thickness: 236. Progression has been stable. Findings include normal foveal contour, no IRF, no SRF, retinal drusen , intraretinal hyper-reflective material, pigment epithelial detachment, outer retinal  atrophy (Stable improvement in IRF/cystic changes inferior fovea, stable improvement in [facility] / PED inferior to fovea ).   Notes *Images captured and stored on drive  Diagnosis / Impression:  OD: exudative ARMD -- stable improvement in cystic changes nasal and temporal fovea, patchy ORA / GA -- no fluid OS: exu-ARMD -- Stable improvement in IRF/cystic changes inferior fovea, stable improvement in [facility] / PED inferior to fovea   Clinical management:  See below  Abbreviations: NFP -  Normal foveal profile. CME - cystoid macular edema. PED - pigment epithelial detachment. IRF - intraretinal fluid. SRF - subretinal fluid. EZ - ellipsoid zone. ERM - epiretinal membrane. ORA - outer retinal atrophy. ORT - outer retinal tubulation. SRHM - subretinal hyper-reflective material      Intravitreal Injection, Pharmacologic Agent - OS - Left Eye       Time Out 09/24/2023. 1:46 PM. Confirmed correct patient, procedure, site, and patient consented.   Anesthesia Topical anesthesia was used. Anesthetic medications included Lidocaine 2%, Proparacaine 0.5%.   Procedure Preparation included 5% betadine to ocular surface, eyelid speculum. A (32g) needle was used.   Injection: 1.25 mg Bevacizumab 1.25mg /0.89ml   Route: Intravitreal, Site: Left Eye   NDC: [id number], Lot: [id number], Expiration date: 05/24/2023   Post-op Post injection exam found visual acuity of at least counting fingers. The patient tolerated the procedure well. There were no complications. The patient received written and verbal post procedure care education. Post injection medications were not given.            ASSESSMENT/PLAN:    ICD-10-CM   1. Exudative age-related macular degeneration of left eye with active choroidal neovascularization (HCC)  H35.3221 OCT, Retina - OU - Both Eyes    Intravitreal Injection, Pharmacologic Agent - OS - Left Eye    Bevacizumab (AVASTIN) SOLN 1.25 mg    2. Exudative age-related macular degeneration of right eye with active choroidal neovascularization (HCC)  H35.3211     3. Essential hypertension  I10     4. Hypertensive retinopathy of both eyes  H35.033     5. Pseudophakia of both eyes  Z96.1     6. Dry eyes  H04.123     7. Left posterior capsular opacification  H26.492       1. Exudative age-related macular degeneration, left eye  - OCT 4.30.2020 showed interval conversion of OS from nonexudative ARMD to exu-ARMD with large dome-shaped PED with overlying  IRF/SRF  - FA 05.28.20 -- no active CNV OS, just staining - s/p IVA OS #1 (04.30.20), #2 (05.28.20), #3 (06.26.20), #4 (08.03.20), #5 (09.11.20), #6 (10.19.20), #7 (11.23.20), - reactivation of CNV noted on 05.05.22 -- s/p IVA OS  #8 (05.05.22), #9 (06.06.22), #10 (07.19.22), #11 (09.13.22), #12 (10.25.22), #13 (12.13.22), #14 (01.30.23), #15 (03.20.23), #16 (05.08.23), #17 (06.19.23), #18 (08.07.23), #19 (10.02.23), #20 (11.20.23), #21 (01.15.24) =========================================== - s/p IVE OS #1 (03.11.24), #2 (04.22.24), #3 (05.29.24), #4 (07.22.24), #5 (09.09.24), #6 (10.28.24), #7 (12.19.24)  - **Interval increase in IRF overlying PED at 8 wks on 09.13.22 visit (IVA)**  - BCVA OS stable at 20/30-- improved post-YAG - OCT shows OS: Stable improvement in IRF/cystic changes inferior fovea, stable improvement in [facility] / PED inferior to fovea at 9 wks - pt wishes to go back to IVA OS due to [employer] funding being unavailable  - recommend IVA OS #22 today, 12.19.24 with follow up in 8 weeks  - pt wishes to proceed with injection  - RBA of procedure discussed, questions answered -  see procedure note - IVE informed consent obtained and signed, 03.11.24 - IVA informed consent obtained and signed 02.21.25  - benefits investigation for Eylea initiated 4.30.2020 -- approved for 2024  - f/u in 8 weeks -- DFE/OCT, possible injection  2. Age related macular degeneration, exudative, OD - interval development of IRF first noted on 09.11.20 -- conversion from nonexudative to exudative ARMD - pt initially presented on 11.27.19 due to alert from [employer] home monitoring system for OD  - [facility] prescribed by Dr. Elmer Picker - FA (5.28.2020) showed staining / window defect corresponding temporal RPE changes OU -- no active CNV OU - S/P IVA OD #1 (09.11.20), #2 (10.19.20), #3 (11.23.20), #4 (01.07.21), #5 (2.19.21), #6 (04.02.21), #7 (05.28.21), #8 (07.23.21), #9 (09.24.21), #10 (11.29.21), #11 (02.11.22),  #12 (05.05.22), #13 (7.19.22), #14 (09.13.22), #15 (10.02.23), #16 (11.20.23), #17 (01.15.24), #18 (03.11.24) - OCT shows stable improvement in cystic changes nasal and temporal fovea, patchy ORA / GA at 4+ mos since last injxn  - BCVA OD stable at 20/25  - recommend holding IVA OD again today -- will treat PRN - IVA informed consent obtained and signed, 05.08.23 (OU) - see procedure note  - f/u 9 weeks DFE, OCT  3,4. Hypertensive retinopathy OU  - discussed importance of tight BP control  - monitor  5. Pseudophakia OU  - s/p CE/IOL OU  - beautiful surgeries, doing well  - monitor  6. Dry eyes OU  - recommend artificial tears and lubricating ointment as needed  - using Miebo and Lumify per Dr. Bascom Levels  7. PCO OU (OS > OD)  - s/p Yag Cap OS 07.31.24 - BCVA OS 20/30 and pleased with the result     Ophthalmic Meds Ordered this visit:  Meds ordered this encounter  Medications   Bevacizumab (AVASTIN) SOLN 1.25 mg     Return in about 8 weeks (around 11/19/2023) for f/u Ex. AMD OS, DFE, OCT, Possible, IVA, Vs., IVE, OS.  There are no Patient Instructions on file for this visit.  This document serves as a record of services personally performed by Karie Chimera, MD, PhD. It was created on their behalf by Berlin Hun COT, an ophthalmic technician. The creation of this record is the provider's dictation and/or activities during the visit.    Electronically signed by: Berlin Hun COT 02.18.25 4:36 PM  This document serves as a record of services personally performed by Karie Chimera, MD, PhD. It was created on their behalf by Charlette Caffey, COT an ophthalmic technician. The creation of this record is the provider's dictation and/or activities during the visit.    Electronically signed by:  Charlette Caffey, COT  09/24/23 4:36 PM  Karie Chimera, M.D., Ph.D. Diseases & Surgery of the Retina and Vitreous [facility] 09/24/2023    I have reviewed the above documentation for accuracy and completeness, and I agree with the above. Karie Chimera, M.D., Ph.D. 09/24/23 4:38 PM   Abbreviations: M myopia (nearsighted); A astigmatism; H hyperopia (farsighted); P presbyopia; Mrx spectacle prescription;  CTL contact lenses; OD right eye; OS left eye; OU both eyes  XT exotropia; ET esotropia; PEK punctate epithelial keratitis; PEE punctate epithelial erosions; DES dry eye syndrome; MGD meibomian gland dysfunction; ATs artificial tears; PFAT's preservative free artificial tears; NSC nuclear sclerotic cataract; PSC posterior subcapsular cataract; ERM epi-retinal membrane; PVD posterior vitreous detachment; RD retinal detachment; DM diabetes mellitus; DR diabetic retinopathy; NPDR non-proliferative diabetic retinopathy; PDR proliferative diabetic retinopathy;  CSME clinically significant macular edema; DME diabetic macular edema; dbh dot blot hemorrhages; CWS cotton wool spot; POAG primary open angle glaucoma; C/D cup-to-disc ratio; HVF humphrey visual field; GVF goldmann visual field; OCT optical coherence tomography; IOP intraocular pressure; BRVO Branch retinal vein occlusion; CRVO central retinal vein occlusion; CRAO central retinal artery occlusion; BRAO branch retinal artery occlusion; RT retinal tear; SB scleral buckle; PPV pars plana vitrectomy; VH Vitreous hemorrhage; PRP panretinal laser photocoagulation; IVK intravitreal kenalog; VMT vitreomacular traction; MH Macular hole;  NVD neovascularization of the disc; NVE neovascularization elsewhere; AREDS age related eye disease study; ARMD age related macular degeneration; POAG primary open angle glaucoma; EBMD epithelial/anterior basement membrane dystrophy; ACIOL anterior chamber intraocular lens; IOL intraocular lens; PCIOL posterior chamber intraocular lens; Phaco/IOL phacoemulsification with intraocular lens placement; PRK photorefractive keratectomy; LASIK laser assisted in situ  keratomileusis; HTN hypertension; DM diabetes mellitus; COPD chronic obstructive pulmonary disease

## 2023-09-24 ENCOUNTER — Ambulatory Visit (INDEPENDENT_AMBULATORY_CARE_PROVIDER_SITE_OTHER): Payer: Medicare Other | Admitting: Ophthalmology

## 2023-09-24 ENCOUNTER — Encounter (INDEPENDENT_AMBULATORY_CARE_PROVIDER_SITE_OTHER): Payer: Self-pay | Admitting: Ophthalmology

## 2023-09-24 DIAGNOSIS — Z961 Presence of intraocular lens: Secondary | ICD-10-CM | POA: Diagnosis not present

## 2023-09-24 DIAGNOSIS — H35033 Hypertensive retinopathy, bilateral: Secondary | ICD-10-CM | POA: Diagnosis not present

## 2023-09-24 DIAGNOSIS — H353231 Exudative age-related macular degeneration, bilateral, with active choroidal neovascularization: Secondary | ICD-10-CM | POA: Diagnosis not present

## 2023-09-24 DIAGNOSIS — I10 Essential (primary) hypertension: Secondary | ICD-10-CM | POA: Diagnosis not present

## 2023-09-24 DIAGNOSIS — H04123 Dry eye syndrome of bilateral lacrimal glands: Secondary | ICD-10-CM

## 2023-09-24 DIAGNOSIS — H26492 Other secondary cataract, left eye: Secondary | ICD-10-CM

## 2023-09-24 DIAGNOSIS — H353211 Exudative age-related macular degeneration, right eye, with active choroidal neovascularization: Secondary | ICD-10-CM

## 2023-09-24 DIAGNOSIS — H353221 Exudative age-related macular degeneration, left eye, with active choroidal neovascularization: Secondary | ICD-10-CM

## 2023-09-24 MED ORDER — BEVACIZUMAB CHEMO INJECTION 1.25MG/0.05ML SYRINGE FOR KALEIDOSCOPE
1.2500 mg | INTRAVITREAL | Status: AC | PRN
  Administered 2023-09-24: 1.25 mg via INTRAVITREAL

## 2023-10-13 ENCOUNTER — Ambulatory Visit (INDEPENDENT_AMBULATORY_CARE_PROVIDER_SITE_OTHER)

## 2023-10-13 ENCOUNTER — Ambulatory Visit: Admitting: Podiatry

## 2023-10-13 ENCOUNTER — Encounter: Payer: Self-pay | Admitting: Podiatry

## 2023-10-13 DIAGNOSIS — G588 Other specified mononeuropathies: Secondary | ICD-10-CM

## 2023-10-13 DIAGNOSIS — M2041 Other hammer toe(s) (acquired), right foot: Secondary | ICD-10-CM

## 2023-10-13 DIAGNOSIS — M2170 Unequal limb length (acquired), unspecified site: Secondary | ICD-10-CM | POA: Diagnosis not present

## 2023-10-13 NOTE — Progress Notes (Signed)
 Subjective:   Patient ID: Kaitlyn Parks, female   DOB: 89 y.o.   MRN: [id number]   HPI Patient presents with several problems with 1 being digital deformity digit for right foot that is painful with pressure and walking and abnormal gait process with limb length discrepancy with left leg being significantly shorter.  Patient does not smoke likes to be active   Review of Systems  All other systems reviewed and are negative.       Objective:  Physical Exam Vitals and nursing note reviewed.  Constitutional:      Appearance: She is well-developed.  Pulmonary:     Effort: Pulmonary effort is normal.  Musculoskeletal:        General: Normal range of motion.  Skin:    General: Skin is warm.  Neurological:     Mental Status: She is alert.     Neurovascular status intact muscle strength adequate range of motion adequate with exquisite discomfort distal lateral aspect digit 4 right with rotation of the toe and also noted to have limb length discrepancy with chronic foot structural issues secondary to structure     Assessment:  Hammertoe deformity digit 4 right with rotational component and keratotic tissue formation along with chronic foot pain abnormal structure limb length discrepancy     Plan:  H&P reviewed conditions and went ahead today and applied cushioning fourth toe discussed distal arthroplasty which may be necessary discussed orthotics she wants to have these made and is casted for functional orthotics by pedorthist after she is evaluated  X-rays indicate that there is rotation of the fourth toe right depression of the arch noted minimal arthritic conditions noted

## 2023-10-20 ENCOUNTER — Other Ambulatory Visit: Payer: Self-pay | Admitting: Cardiology

## 2023-10-20 ENCOUNTER — Other Ambulatory Visit: Payer: Self-pay | Admitting: Family Medicine

## 2023-10-20 DIAGNOSIS — E039 Hypothyroidism, unspecified: Secondary | ICD-10-CM

## 2023-10-20 NOTE — Telephone Encounter (Signed)
 Pt doesn't have a follow up with me scheduled yet-- please let pt know that she will need one in July for her annual follow up.

## 2023-10-21 ENCOUNTER — Telehealth: Payer: Self-pay | Admitting: *Deleted

## 2023-10-21 NOTE — Telephone Encounter (Signed)
 Copied from CRM [id number]. Topic: Clinical - Request for Lab/Test Order >> Oct 21, 2023  3:00 PM Kaitlyn Parks wrote: Reason for CRM: Patient returning phone call from [facility] - to schedule labs, no orders on file.

## 2023-10-21 NOTE — Telephone Encounter (Signed)
Left a detailed message with the information below at the patient's home number.

## 2023-10-22 NOTE — Telephone Encounter (Signed)
 See prior phone note-patient needs an appt and was scheduled for an appt on 3/27 with PCP.

## 2023-10-28 ENCOUNTER — Ambulatory Visit: Admitting: Family Medicine

## 2023-10-28 ENCOUNTER — Telehealth: Payer: Self-pay | Admitting: Family Medicine

## 2023-10-28 ENCOUNTER — Encounter: Payer: Self-pay | Admitting: Family Medicine

## 2023-10-28 ENCOUNTER — Telehealth: Payer: Self-pay

## 2023-10-28 VITALS — BP 122/56 | HR 65 | Temp 97.5°F | Ht 63.0 in | Wt 127.4 lb

## 2023-10-28 DIAGNOSIS — R1012 Left upper quadrant pain: Secondary | ICD-10-CM | POA: Diagnosis not present

## 2023-10-28 DIAGNOSIS — K862 Cyst of pancreas: Secondary | ICD-10-CM

## 2023-10-28 DIAGNOSIS — E039 Hypothyroidism, unspecified: Secondary | ICD-10-CM

## 2023-10-28 DIAGNOSIS — K219 Gastro-esophageal reflux disease without esophagitis: Secondary | ICD-10-CM | POA: Diagnosis not present

## 2023-10-28 DIAGNOSIS — M810 Age-related osteoporosis without current pathological fracture: Secondary | ICD-10-CM

## 2023-10-28 LAB — COMPREHENSIVE METABOLIC PANEL WITH GFR
ALT: 13 U/L (ref 0–35)
AST: 16 U/L (ref 0–37)
Albumin: 3.9 g/dL (ref 3.5–5.2)
Alkaline Phosphatase: 61 U/L (ref 39–117)
BUN: 26 mg/dL — ABNORMAL HIGH (ref 6–23)
CO2: 30 meq/L (ref 19–32)
Calcium: 8.8 mg/dL (ref 8.4–10.5)
Chloride: 106 meq/L (ref 96–112)
Creatinine, Ser: 0.72 mg/dL (ref 0.40–1.20)
GFR: 75.62 mL/min (ref >60.00)
Glucose, Bld: 91 mg/dL (ref 70–99)
Potassium: 3.9 meq/L (ref 3.5–5.1)
Sodium: 140 meq/L (ref 135–145)
Total Bilirubin: 0.3 mg/dL (ref 0.2–1.2)
Total Protein: 6.2 g/dL (ref 6.0–8.3)

## 2023-10-28 LAB — LIPASE: Lipase: 28 U/L (ref 11.0–59.0)

## 2023-10-28 NOTE — Telephone Encounter (Unsigned)
 Copied from CRM [id number]. Topic: General - Other >> Oct 28, 2023  1:22 PM Eunice Blase wrote: Reason for CRM: Received call from [facility] imaging per [facility] ph: [phone] option 1 then option 4. , fax: [phone] revise order CT abdominal with and without pancreatic protocol.

## 2023-10-28 NOTE — Patient Instructions (Signed)
 Famotidine 40 mg once a day or 20 mg twice a day -- common name is pepcid or pepcid [facility]

## 2023-10-28 NOTE — Assessment & Plan Note (Signed)
 Last TSH was WNL, she is due for new testing today. Continue 50 mcg of levothyroxine daily.

## 2023-10-28 NOTE — Telephone Encounter (Signed)
 LEFT VM TO SCHEDULE OPU

## 2023-10-28 NOTE — Progress Notes (Signed)
 Established Patient Office Visit  Subjective   Patient ID: Kaitlyn Parks, female    DOB: Aug 31, 1934  Age: 89 y.o. MRN: [id number]  Chief Complaint  Patient presents with   Abdominal Pain    Patient complains of abdominal pain x2 weeks, questioned if this could be due to taking Aleve for hip and left knee pain    Pt is here for abdominal pain and other medical conditions.   Pt reports that she has been having worsening hip and knee pain on the left, right hip already replaced by [facility], she has been taking more NSAIDS like ibuprofen and thinks this might be aggravating her stomach. No changes in her stools, states she has been taking her NSAIDS with food-- she will occasionally take antacids and maalox plus if it is really bothering her. States she does have an appointment already set up with the orthopedist.   Pt denies any nausea or vomiting, no fever or chills. States she was diagnosed with acute pancreatitis in Dec 2023. States she is worried that this new stomach thing might be     Current Outpatient Medications  Medication Instructions   alum & mag hydroxide-simeth (MAALOX PLUS) 400-400-40 MG/5ML suspension 15 mLs, Oral, Every 6 hours PRN   amLODipine (NORVASC) 2.5 mg, Oral, Daily   aspirin EC 81 mg, Daily   Biotin w/ Vitamins C & E (HAIR/SKIN/NAILS PO) 1 tablet, Daily   Brimonidine Tartrate (LUMIFY) 0.025 % SOLN 1 drop, 6 times daily   Cranberry 400 mg, Daily   hydrALAZINE (APRESOLINE) 10 MG tablet TAKE (1) TABLET TWICE A DAY. MAY TAKE EXTRA DOSE FOR SBP >160 UP TO A TOTAL OF 4 TIMES A DAY AS NEEDED   ibuprofen (ADVIL) 400 mg, Every 6 hours PRN   isosorbide mononitrate (IMDUR) 60 MG 24 hr tablet TAKE 1 & 1/2 TABLETS A DAY.   meclizine (ANTIVERT) 25 mg, Oral, 3 times daily PRN   Multiple Vitamins-Calcium (ONE-A-DAY WOMENS PO) 1 tablet, Daily   nitroGLYCERIN (NITROSTAT) 0.4 MG SL tablet DISSOLVE 1 TABLET UNDER TONGUE IF NEEDED FOR CHEST PAIN. MAY REPEAT IN 5 MINUTES FOR  3 DOSES.   NON FORMULARY Get steroid inject for SI joint every 12 wks with Dr. Smitty Pluck surgery spine   Perfluorohexyloctane (MIEBO OP) 1 drop, Daily   Polyethyl Glycol-Propyl Glycol (SYSTANE) 0.4-0.3 % GEL ophthalmic gel 1 Application, Daily PRN   PRESCRIPTION MEDICATION Avastin eye injections given by Dr Vanessa Lonya Johannesen due to macular degeneration . Every 7wks.   rosuvastatin (CRESTOR) 10 MG tablet TABLET ONE-HALF TABLET BY MOUTH DAILY   SYNTHROID 50 MCG tablet TAKE 1 TABLET EACH DAY. NEED APPOINTMENT FOR ADDITIONAL REFILLS    Patient Active Problem List   Diagnosis Date Noted   Pancreatitis, acute 06/14/2022   Abdominal pain 06/13/2022   Acute pancreatitis 06/13/2022   Hyponatremia 06/13/2022   Gastritis 06/11/2022   Aortic atherosclerosis (HCC) 12/07/2019   Macular degeneration 01/11/2019   Coronary artery disease involving native coronary artery of native heart without angina pectoris 10/29/2018   Presence of intraocular lens 09/23/2018   Bradycardia 11/11/2017   Dyslipidemia 11/11/2017   CAD (coronary artery disease) 06/12/2017   GERD (gastroesophageal reflux disease) 06/12/2017   S/P hip replacement, right 06/12/2017   Osteoarthritis of right hip 06/10/2017   CAD S/P percutaneous coronary angioplasty 02/11/2015   Renal insufficiency 02/11/2015   Prolonged Q-T interval on ECG 04/18/2010   Hypertension 08/11/2007   Hypothyroidism (acquired) 06/08/2007   MITRAL VALVE PROLAPSE 06/08/2007  Review of Systems  Constitutional:  Negative for chills, fever, malaise/fatigue and weight loss.  Respiratory:  Negative for cough.   Cardiovascular:  Negative for chest pain.  Gastrointestinal:  Positive for heartburn. Negative for blood in stool, diarrhea, nausea and vomiting.  Genitourinary:  Negative for flank pain.      Objective:     BP (!) 122/56   Pulse 65   Temp (!) 97.5 F (36.4 C) (Oral)   Ht 5\' 3"  (1.6 m)   Wt 127 lb 6.4 oz (57.8 kg)   SpO2 97%   BMI 22.57  kg/m    Physical Exam Vitals reviewed.  Constitutional:      Appearance: She is well-developed and normal weight.  Cardiovascular:     Rate and Rhythm: Normal rate and regular rhythm.     Heart sounds: Normal heart sounds. No murmur heard. Pulmonary:     Effort: Pulmonary effort is normal.     Breath sounds: Normal breath sounds. No wheezing.  Abdominal:     General: Abdomen is flat. Bowel sounds are normal.     Palpations: Abdomen is soft.     Tenderness: There is no abdominal tenderness. There is no guarding.  Neurological:     Mental Status: She is alert and oriented to person, place, and time.      No results found for any visits on 10/28/23.    The ASCVD Risk score (Arnett DK, et al., 2019) failed to calculate for the following reasons:   The 2019 ASCVD risk score is only valid for ages 78 to 75   Risk score cannot be calculated because patient has a medical history suggesting prior/existing ASCVD    Assessment & Plan:  Pancreatic cyst -     CT ABDOMEN PELVIS WO CONTRAST; Future -     Lipase; Future  Gastroesophageal reflux disease without esophagitis  Left upper quadrant abdominal pain -     CT ABDOMEN PELVIS WO CONTRAST; Future -     Comprehensive metabolic panel with GFR; Future  Hypothyroidism (acquired) Assessment & Plan: Last TSH was WNL, she is due for new testing today. Continue 50 mcg of levothyroxine daily.   Orders: -     TSH; Future  Age-related osteoporosis without current pathological fracture -     DG Bone Density; Future   I spent 30 minutes reviewing the patient's imaging (CT and MRCP) from 2023, reviewing labs and discharge documentation from 2023. she had a cyst present at the time,diagnosed with pancreatic divisim. Her pain is most likely from GERD due to NSAIDS use, I recommend she take daily famotidine (see after visit summary for the written instructions) and to take the NSAIDS with food. She already has appt with ortho to help reduce  the knee and hip pain by other means. Will check lipase and new CT abd to check on the pancreatic cyst.   Return in about 6 months (around 04/29/2024).    Karie Georges, MD

## 2023-10-29 ENCOUNTER — Encounter: Payer: Self-pay | Admitting: Family Medicine

## 2023-10-29 ENCOUNTER — Other Ambulatory Visit: Payer: Self-pay | Admitting: Family Medicine

## 2023-10-29 ENCOUNTER — Ambulatory Visit

## 2023-10-29 DIAGNOSIS — I10 Essential (primary) hypertension: Secondary | ICD-10-CM

## 2023-10-29 LAB — TSH: TSH: 1.68 u[IU]/mL (ref 0.35–5.50)

## 2023-10-29 NOTE — Telephone Encounter (Signed)
 Spoke with Verlon Au at [facility] and informed her of the approval as below as I could not find the CT order with pancreatic protocol requested.  Verlon Au stated the request is for a CT with and without contrast and this order was entered.

## 2023-10-29 NOTE — Telephone Encounter (Signed)
 Ok to send revision of order

## 2023-10-29 NOTE — Progress Notes (Signed)
 Patient presents today to pick up custom molded foot orthotics, diagnosed with digital nerve neuroma  by Dr. Charlsie Merles.   Orthotics were dispensed and fit was satisfactory. Reviewed instructions for break-in and wear. Written instructions given to patient.  Patient will follow up as needed.   Addison Bailey CPed, CFo, CFm

## 2023-11-01 ENCOUNTER — Telehealth: Payer: Self-pay

## 2023-11-01 NOTE — Telephone Encounter (Signed)
 Copied from CRM [id number]. Topic: Clinical - Request for Lab/Test Order >> Nov 01, 2023  2:13 PM Saverio Danker wrote: Reason for CRM: Patient is calling to see if her lab/imaging was scheduled because she has not heard anything since last week

## 2023-11-02 LAB — HM MAMMOGRAPHY

## 2023-11-02 NOTE — Telephone Encounter (Signed)
 I called the patient for more information and she stated she spoke with someone and the CT scan is scheduled for next week.  Patient was advised it can take some time to receive the results as the radiologist are back logged at this time.

## 2023-11-03 ENCOUNTER — Encounter: Payer: Self-pay | Admitting: Family Medicine

## 2023-11-05 ENCOUNTER — Encounter: Payer: Self-pay | Admitting: Family Medicine

## 2023-11-09 ENCOUNTER — Ambulatory Visit
Admission: RE | Admit: 2023-11-09 | Discharge: 2023-11-09 | Disposition: A | Discharge status: Home / Self Care | Source: Ambulatory Visit | Attending: Family Medicine | Admitting: Family Medicine

## 2023-11-09 DIAGNOSIS — K862 Cyst of pancreas: Secondary | ICD-10-CM

## 2023-11-09 DIAGNOSIS — R1012 Left upper quadrant pain: Secondary | ICD-10-CM

## 2023-11-09 MED ORDER — IOPAMIDOL (ISOVUE-300) INJECTION 61%
100.0000 mL | Freq: Once | INTRAVENOUS | Status: AC | PRN
  Administered 2023-11-09: 100 mL via INTRAVENOUS

## 2023-11-10 ENCOUNTER — Encounter: Payer: Self-pay | Admitting: Family Medicine

## 2023-11-17 NOTE — Progress Notes (Signed)
 [facility] - Clinic Note  11/19/2023    CHIEF COMPLAINT Patient presents for Retina Follow Up  HISTORY OF PRESENT ILLNESS: Kaitlyn Parks is a 89 y.o. female who presents to the clinic today for:   HPI     Retina Follow Up   Patient presents with  Wet AMD.  In both eyes.  This started 8 weeks ago.  Duration of 8 weeks.  Since onset it is stable.  I, the attending physician,  performed the HPI with the patient and updated documentation appropriately.        Comments   8 week retina follow up ARMD OS and IVA OS pt is reporting no vision changes noticed she denies any flashes or floaters       Last edited by Valdemar Rogue, MD on 11/19/2023 12:26 PM.    Pt states her vision is stable, but she was disappointed with how she did reading the eye chart this morning, she uses Systane, Lumify  and Meibo, but she ran out of Meibo and isn't sure it's worth the price  Referring physician: [name] [name], MD [address] [place],  [place] [address]  HISTORICAL INFORMATION:  Selected notes from the MEDICAL RECORD NUMBER Referred by Dr. Jordan DeMarco for concern of exu ARMD LEE: 11.26.19 (J. DeMarco) [BCVA: OD: 20/25+ OS: 20/20-] Ocular Hx-DES, non-exu ARMD, Fuch's Dystrophy (K guttata), pseudo OU PMH-HLD, HTN, hypothyroidism   CURRENT MEDICATIONS: Current Outpatient Medications (Ophthalmic Drugs)  Medication Sig   Brimonidine  Tartrate (LUMIFY ) 0.025 % SOLN Apply 1 drop to eye 6 (six) times daily.   Perfluorohexyloctane (MIEBO OP) Apply 1 drop to eye daily.   Polyethyl Glycol-Propyl Glycol (SYSTANE) 0.4-0.3 % GEL ophthalmic gel Place 1 Application into both eyes daily as needed (dry eyes).   No current facility-administered medications for this visit. (Ophthalmic Drugs)   Current Outpatient Medications (Other)  Medication Sig   alum & mag hydroxide-simeth (MAALOX PLUS) 400-400-40 MG/5ML suspension Take 15 mLs by mouth every 6 (six) hours as needed for  indigestion.   amLODipine  (NORVASC ) 2.5 MG tablet Take 1 tablet (2.5 mg total) by mouth daily.   aspirin  EC 81 MG tablet Take 81 mg by mouth daily.   Biotin w/ Vitamins C & E (HAIR/SKIN/NAILS PO) Take 1 tablet by mouth daily.   Cranberry 400 MG CAPS Take 400 mg by mouth daily.   hydrALAZINE  (APRESOLINE ) 10 MG tablet TAKE (1) TABLET TWICE A DAY. MAY TAKE EXTRA DOSE FOR SBP >160 UP TO A TOTAL OF 4 TIMES A DAY AS NEEDED   ibuprofen (ADVIL) 400 MG tablet Take 400 mg by mouth every 6 (six) hours as needed for mild pain.   isosorbide  mononitrate (IMDUR ) 60 MG 24 hr tablet TAKE 1 & 1/2 TABLETS by mouth once A DAY.   meclizine  (ANTIVERT ) 25 MG tablet Take 1 tablet (25 mg total) by mouth 3 (three) times daily as needed for dizziness.   Multiple Vitamins-Calcium  (ONE-A-DAY WOMENS PO) Take 1 tablet by mouth daily.    nitroGLYCERIN  (NITROSTAT ) 0.4 MG SL tablet DISSOLVE 1 TABLET UNDER TONGUE IF NEEDED FOR CHEST PAIN. MAY REPEAT IN 5 MINUTES FOR 3 DOSES.   NON FORMULARY Get steroid inject for SI joint every 12 wks with Dr. Darlis Leash surgery spine   PRESCRIPTION MEDICATION Avastin  eye injections given by Dr Valdemar due to macular degeneration . Every 7wks.   rosuvastatin  (CRESTOR ) 10 MG tablet TABLET ONE-HALF TABLET BY MOUTH DAILY   SYNTHROID  50 MCG  tablet TAKE 1 TABLET EACH DAY. NEED APPOINTMENT FOR ADDITIONAL REFILLS   No current facility-administered medications for this visit. (Other)   Facility-Administered Medications Ordered in Other Visits (Other)  Medication Route   technetium tetrofosmin  (TC-MYOVIEW ) injection 32.9 millicurie Intravenous   REVIEW OF SYSTEMS: ROS   Positive for: Gastrointestinal, Genitourinary, Musculoskeletal, Cardiovascular, Eyes Negative for: Constitutional, Neurological, Skin, HENT, Endocrine, Respiratory, Psychiatric, Allergic/Imm, Heme/Lymph Last edited by Resa Delon ORN, COT on 11/19/2023  9:00 AM.      ALLERGIES Allergies  Allergen Reactions    Acyclovir And Related Other (See Comments)    unknown   Ciprofloxacin  Other (See Comments)    dizziness   Pravachol  [Pravastatin  Sodium] Other (See Comments)    cystitis   Sulfa  Antibiotics     nausea   Zocor  [Simvastatin ] Other (See Comments)    cystitis   PAST MEDICAL HISTORY Past Medical History:  Diagnosis Date   Arthritis    DDD, scoliosis, sees Dr. Mavis for this, uses norco very rarely for pain   Bradycardia 11/11/2017   CAD (coronary artery disease)    LAD stenting of a 90% lesion 2012   Cystocele    Educated about COVID-19 virus infection 12/06/2019   Elevated cholesterol    GERD (gastroesophageal reflux disease)    dx in work up 2016 for atypical CP at OSH   pt. denies   Heart murmur    Hypertensive retinopathy    OU   Hypothyroidism    Interstitial cystitis    sees Dr. Watt   Macular degeneration    OU   NSTEMI (non-ST elevated myocardial infarction) [facility])    Osteoporosis    Rectocele    S/P hip replacement, right 06/12/2017   Scoliosis    Thyroid  disease    Hypothyroid   Urinary incontinence    USI   Uterine prolapse    Past Surgical History:  Procedure Laterality Date   BLADDER SUSPENSION  2011   CATARACT EXTRACTION Bilateral 2015   Dr. Cleatus   CORONARY ANGIOPLASTY WITH STENT PLACEMENT  04/21/2010   LAD 80%, RCA 30%, nl EF, s/p DES LAD   EYE SURGERY     LEFT HEART CATH AND CORONARY ANGIOGRAPHY N/A 06/14/2017   Procedure: LEFT HEART CATH AND CORONARY ANGIOGRAPHY;  Surgeon: Court Dorn PARAS, MD;  Location: [facility] INVASIVE [facility];  Service: Cardiovascular;  Laterality: N/A;   OOPHORECTOMY  2011   BSO   TOTAL HIP ARTHROPLASTY Right 06/10/2017   Procedure: RIGHT TOTAL HIP ARTHROPLASTY ANTERIOR APPROACH;  Surgeon: Fidel Rogue, MD;  Location: [facility] OR[facility];  Service: Orthopedics;  Laterality: Right;  Needs RNFA   VAGINAL HYSTERECTOMY  2011   LAVH BSO; benign   FAMILY HISTORY Family History  Problem Relation Age of Onset   Hypertension Mother    Heart  disease Mother    Heart disease Father    Heart disease Sister    Diabetes Sister    Uterine cancer Sister        mets to lungs   Lung cancer Sister    Scoliosis Sister    Heart disease Brother    Colon cancer Paternal Aunt 61   Breast cancer Paternal Aunt        Age 35's   Breast cancer Paternal Aunt    Leukemia Paternal Aunt    Lung cancer Paternal Grandfather        smoker   Arthritis Daughter    Breast cancer Cousin        Maternal  1st cousins-Age 66's   Esophageal cancer Neg Hx    Pancreatic cancer Neg Hx    Stomach cancer Neg Hx    SOCIAL HISTORY Social History   Tobacco Use   Smoking status: Never   Smokeless tobacco: Never  Vaping Use   Vaping status: Never Used  Substance Use Topics   Alcohol  use: No    Comment: Rare   Drug use: No       OPHTHALMIC EXAM: Base Eye Exam     Visual Acuity (Snellen - Linear)       Right Left   Dist [place] 20/40 -2 20/40   Dist ph [place] 20/30 -2 NI    Correction: Glasses         Tonometry (Tonopen, 9:12 AM)       Right Left   Pressure 15 16         Pupils       Pupils Dark Light Shape React APD   Right PERRL 3 2 Round Brisk None   Left PERRL 3 2 Round Brisk None         Visual Fields       Left Right    Full Full         Extraocular Movement       Right Left    Full, Ortho Full, Ortho         Neuro/Psych     Oriented x3: Yes   Mood/Affect: Normal         Dilation     Both eyes: 2.5% Phenylephrine  @ 9:12 AM           Slit Lamp and Fundus Exam     Slit Lamp Exam       Right Left   Lids/Lashes mild Telangiectasia -- improved, marginal lesion nasal UL Dermatochalasis - upper lid   Conjunctiva/Sclera White and quiet White and quiet, inferior conj chalasis   Cornea Arcus, 2+ fine Punctate epithelial erosions, tear film debris, decreased TBUT arcus, tear film debris, 2+ inferior Punctate epithelial erosions   Anterior Chamber Deep and clear Deep and clear   Iris Round and dilated  Round and well dilated   Lens PC IOL in good position, 1+Posterior capsular opacification IN PC IOL in good position with open PC   Anterior Vitreous Vitreous syneresis, Posterior vitreous detachment Vitreous syneresis, Posterior vitreous detachment, vitreous condensations         Fundus Exam       Right Left   Disc Pink and Sharp, Compact, focal PPP Pink and Sharp, mild temporal PPA/PPP   C/D Ratio 0.3 0.3   Macula Blunted foveal reflex, Drusen, RPE mottling and clumping, early Atrophy, +PEDs -- stably improved, trace cystic changes -- stably improved, No frank heme, no edema Blunted foveal reflex, +drusen, pigment clumping, RPE mottling, clumping and atrophy, no heme, central PED / CNV with overlying cystic changes -- stably improved, +GA   Vessels attenuated, Tortuous attenuated, Tortuous   Periphery Attached, scattered reticular degeneration Attached, scattered reticular degeneration           IMAGING AND PROCEDURES  Imaging and Procedures for @TODAY @  Intravitreal Injection, Pharmacologic Agent - OS - Left Eye       Time Out 11/19/2023. 10:10 AM. Confirmed correct patient, procedure, site, and patient consented.   Anesthesia Topical anesthesia was used. Anesthetic medications included Lidocaine  2%, Proparacaine 0.5%.   Procedure Preparation included 5% betadine  to ocular surface, eyelid speculum. A (32g) needle was used.  Injection: 2 mg aflibercept  2 MG/0.05ML   Route: Intravitreal, Site: Left Eye   NDC: [id number], Lot: [id number], Expiration date: 11/30/2024, Waste: 0 mL   Post-op Post injection exam found visual acuity of at least counting fingers. The patient tolerated the procedure well. There were no complications. The patient received written and verbal post procedure care education. Post injection medications were not given.             ASSESSMENT/PLAN:    ICD-10-CM   1. Exudative age-related macular degeneration of left eye with active choroidal  neovascularization (HCC)  H35.3221 Intravitreal Injection, Pharmacologic Agent - OS - Left Eye    aflibercept  (EYLEA ) SOLN 2 mg    2. Exudative age-related macular degeneration of right eye with active choroidal neovascularization (HCC)  H35.3211     3. Essential hypertension  I10     4. Hypertensive retinopathy of both eyes  H35.033     5. Pseudophakia of both eyes  Z96.1     6. Dry eyes  H04.123     7. Left posterior capsular opacification  H26.492      1. Exudative age-related macular degeneration, left eye  - OCT 4.30.2020 showed interval conversion of OS from nonexudative ARMD to exu-ARMD with large dome-shaped PED with overlying IRF/SRF  - FA 05.28.20 -- no active CNV OS, just staining - s/p IVA OS #1 (04.30.20), #2 (05.28.20), #3 (06.26.20), #4 (08.03.20), #5 (09.11.20), #6 (10.19.20), #7 (11.23.20) - reactivation of CNV noted on 05.05.22 -- s/p IVA OS  #8 (05.05.22), #9 (06.06.22), #10 (07.19.22), #11 (09.13.22), #12 (10.25.22), #13 (12.13.22), #14 (01.30.23), #15 (03.20.23), #16 (05.08.23), #17 (06.19.23), #18 (08.07.23), #19 (10.02.23), #20 (11.20.23), #21 (01.15.24), #22 (12.19.24), #23 (02.21.25) =========================================== - s/p IVE OS #1 (03.11.24), #2 (04.22.24), #3 (05.29.24), #4 (07.22.24), #5 (09.09.24), #6 (10.28.24), #7 (12.19.24) - **Interval increase in IRF overlying PED at 8 wks on 09.13.22 visit (IVA)**  - BCVA OS 20/40 from 20/30 at 8 wks - no OCT obtained today -- camera not working  - pt wishes to go back to IVE OS #8 today, 04.18.25. with follow up back to 7 weeks  - pt wishes to proceed with injection  - RBA of procedure discussed, questions answered - see procedure note - IVE informed consent obtained and signed, 03.11.24 - IVA informed consent obtained and signed 02.21.25  - benefits investigation for Eylea  initiated 4.30.2020 -- approved for 2025 but responsible for 20% coinsurance on medications  - f/u in 7 weeks -- DFE/OCT, possible  injection  2. Age related macular degeneration, exudative, OD - interval development of IRF first noted on 09.11.20 -- conversion from nonexudative to exudative ARMD - pt initially presented on 11.27.19 due to alert from [facility] home monitoring system for OD  - [facility] prescribed by Dr. Cleatus - FA (5.28.2020) showed staining / window defect corresponding temporal RPE changes OU -- no active CNV OU - S/P IVA OD #1 (09.11.20), #2 (10.19.20), #3 (11.23.20), #4 (01.07.21), #5 (2.19.21), #6 (04.02.21), #7 (05.28.21), #8 (07.23.21), #9 (09.24.21), #10 (11.29.21), #11 (02.11.22), #12 (05.05.22), #13 (7.19.22), #14 (09.13.22), #15 (10.02.23), #16 (11.20.23), #17 (01.15.24), #18 (03.11.24) - no OCT obtained today  - BCVA OD stable at 20/30 (13+ mos since last injxn)  - recommend holding IVA OD again today -- will treat PRN - IVA informed consent obtained and signed, 05.08.23 (OU) - see procedure note  - f/u 7 weeks DFE, OCT  3,4. Hypertensive retinopathy OU  - discussed importance of tight BP control  -  monitor  5. Pseudophakia OU  - s/p CE/IOL OU  - beautiful surgeries, doing well  - monitor  6. Dry eyes OU  - recommend artificial tears and lubricating ointment as needed  - using Miebo and Lumify  per Dr. Vivian  7. PCO OU (OS > OD)  - s/p Yag Cap OS 07.31.24 - BCVA OS 20/30 and pleased with the result     Ophthalmic Meds Ordered this visit:  Meds ordered this encounter  Medications   aflibercept  (EYLEA ) SOLN 2 mg     Return in about 7 weeks (around 01/07/2024) for f/u exu ARMD OU, DFE, OCT, Possible Injxn.  There are no Patient Instructions on file for this visit. This document serves as a record of services personally performed by Redell JUDITHANN Hans, MD, PhD. It was created on their behalf by Wanda GEANNIE Keens, COT an ophthalmic technician. The creation of this record is the provider's dictation and/or activities during the visit.    Electronically signed by:  Wanda GEANNIE Keens,  COT  11/22/23 9:15 PM  This document serves as a record of services personally performed by Redell JUDITHANN Hans, MD, PhD. It was created on their behalf by Alan PARAS. Delores, OA an ophthalmic technician. The creation of this record is the provider's dictation and/or activities during the visit.    Electronically signed by: Alan PARAS. Delores, OA 11/22/23 9:15 PM  Redell JUDITHANN Hans, M.D., Ph.D. Diseases & Surgery of the Retina and Vitreous [facility] 11/19/2023   I have reviewed the above documentation for accuracy and completeness, and I agree with the above. Redell JUDITHANN Hans, M.D., Ph.D. 11/22/23 9:17 PM   Abbreviations: M myopia (nearsighted); A astigmatism; H hyperopia (farsighted); P presbyopia; Mrx spectacle prescription;  CTL contact lenses; OD right eye; OS left eye; OU both eyes  XT exotropia; ET esotropia; PEK punctate epithelial keratitis; PEE punctate epithelial erosions; DES dry eye syndrome; MGD meibomian gland dysfunction; ATs artificial tears; PFAT's preservative free artificial tears; NSC nuclear sclerotic cataract; PSC posterior subcapsular cataract; ERM epi-retinal membrane; PVD posterior vitreous detachment; RD retinal detachment; DM diabetes mellitus; DR diabetic retinopathy; NPDR non-proliferative diabetic retinopathy; PDR proliferative diabetic retinopathy; CSME clinically significant macular edema; DME diabetic macular edema; dbh dot blot hemorrhages; CWS cotton wool spot; POAG primary open angle glaucoma; C/D cup-to-disc ratio; HVF humphrey visual field; GVF goldmann visual field; OCT optical coherence tomography; IOP intraocular pressure; BRVO Branch retinal vein occlusion; CRVO central retinal vein occlusion; CRAO central retinal artery occlusion; BRAO branch retinal artery occlusion; RT retinal tear; SB scleral buckle; PPV pars plana vitrectomy; VH Vitreous hemorrhage; PRP panretinal laser photocoagulation; IVK intravitreal kenalog ; VMT vitreomacular traction; MH  Macular hole;  NVD neovascularization of the disc; NVE neovascularization elsewhere; AREDS age related eye disease study; ARMD age related macular degeneration; POAG primary open angle glaucoma; EBMD epithelial/anterior basement membrane dystrophy; ACIOL anterior chamber intraocular lens; IOL intraocular lens; PCIOL posterior chamber intraocular lens; Phaco/IOL phacoemulsification with intraocular lens placement; PRK photorefractive keratectomy; LASIK laser assisted in situ keratomileusis; HTN hypertension; DM diabetes mellitus; COPD chronic obstructive pulmonary disease

## 2023-11-19 ENCOUNTER — Encounter (INDEPENDENT_AMBULATORY_CARE_PROVIDER_SITE_OTHER): Payer: Medicare Other | Admitting: Ophthalmology

## 2023-11-19 ENCOUNTER — Ambulatory Visit (INDEPENDENT_AMBULATORY_CARE_PROVIDER_SITE_OTHER): Admitting: Ophthalmology

## 2023-11-19 ENCOUNTER — Encounter (INDEPENDENT_AMBULATORY_CARE_PROVIDER_SITE_OTHER): Payer: Self-pay | Admitting: Ophthalmology

## 2023-11-19 DIAGNOSIS — H353231 Exudative age-related macular degeneration, bilateral, with active choroidal neovascularization: Secondary | ICD-10-CM

## 2023-11-19 DIAGNOSIS — H26492 Other secondary cataract, left eye: Secondary | ICD-10-CM

## 2023-11-19 DIAGNOSIS — H35033 Hypertensive retinopathy, bilateral: Secondary | ICD-10-CM

## 2023-11-19 DIAGNOSIS — H353221 Exudative age-related macular degeneration, left eye, with active choroidal neovascularization: Secondary | ICD-10-CM

## 2023-11-19 DIAGNOSIS — H353211 Exudative age-related macular degeneration, right eye, with active choroidal neovascularization: Secondary | ICD-10-CM

## 2023-11-19 DIAGNOSIS — Z961 Presence of intraocular lens: Secondary | ICD-10-CM

## 2023-11-19 DIAGNOSIS — I10 Essential (primary) hypertension: Secondary | ICD-10-CM

## 2023-11-19 DIAGNOSIS — H04123 Dry eye syndrome of bilateral lacrimal glands: Secondary | ICD-10-CM

## 2023-11-19 MED ORDER — AFLIBERCEPT 2MG/0.05ML IZ SOLN FOR KALEIDOSCOPE
2.0000 mg | INTRAVITREAL | Status: AC | PRN
  Administered 2023-11-19: 2 mg via INTRAVITREAL

## 2023-11-23 ENCOUNTER — Ambulatory Visit: Admitting: Family Medicine

## 2023-11-23 ENCOUNTER — Encounter: Payer: Self-pay | Admitting: Family Medicine

## 2023-11-23 ENCOUNTER — Ambulatory Visit: Payer: Self-pay

## 2023-11-23 VITALS — BP 140/68 | HR 65 | Temp 98.2°F | Ht 63.0 in | Wt 124.5 lb

## 2023-11-23 DIAGNOSIS — K279 Peptic ulcer, site unspecified, unspecified as acute or chronic, without hemorrhage or perforation: Secondary | ICD-10-CM

## 2023-11-23 MED ORDER — PANTOPRAZOLE SODIUM 40 MG PO TBEC: DELAYED_RELEASE_TABLET | ORAL | 2 refills | Status: DC

## 2023-11-23 NOTE — Patient Instructions (Signed)
 Stop maalox and pepcid  -- switch to the pantoprazole 

## 2023-11-23 NOTE — Telephone Encounter (Signed)
 Seen in the office today at 9am.

## 2023-11-23 NOTE — Telephone Encounter (Signed)
  Chief Complaint: abd pain Symptoms: dark stool last week, constipation, blood when wiping today, abd pain,  Frequency: dark stools started last week, today stool was brown, abd pain 8 yesterday, today 3 Pertinent Negatives: Patient denies fever, back pain, hx of hemorrhoids,  Disposition: [] ED /[] Urgent Care (no appt availability in office) / [x] Appointment(In office/virtual)/ []  [facility]/ [] Home Care/ [] Refused Recommended Disposition /[] [place] [facility]/ []  Follow-up with PCP Additional Notes: Pt states "I need to see someone today at [facility] or [facility]". Pt states that she has been taking a lot of NSAIDs. Pt states that she was constipated last week with dark stools. States today BM was normal, but did have blood when wiping. Pt denies hx of hemorrhoids. Pt scheduled with PCP today.  Copied from CRM [id number]. Topic: Appointments - Appointment Scheduling >> Nov 23, 2023  8:16 AM Rosaria Common wrote: Patient/patient representative is calling to schedule an appointment. Refer to attachments for appointment information. Pt is having severe stomach, and leg pain with bloody stools. Has taken a lot of Advil. Reason for Disposition  [1] MILD-MODERATE pain AND [2] constant AND [3] present > 2 hours  Answer Assessment - Initial Assessment Questions 1. LOCATION: "Where does it hurt?"      Lower chest and all of abd 2. RADIATION: "Does the pain shoot anywhere else?" (e.g., chest, back)     Does not radiate 3. ONSET: "When did the pain begin?" (e.g., minutes, hours or days ago)      Yesterday,  4. SUDDEN: "Gradual or sudden onset?"     Sudden, was feeling low energy last week. 5. PATTERN "Does the pain come and go, or is it constant?"    - If it comes and goes: "How long does it last?" "Do you have pain now?"     (Note: Comes and goes means the pain is intermittent. It goes away completely between bouts.)    - If constant: "Is it getting better, staying the same, or getting  worse?"      (Note: Constant means the pain never goes away completely; most serious pain is constant and gets worse.)      constant 6. SEVERITY: "How bad is the pain?"  (e.g., Scale 1-10; mild, moderate, or severe)    - MILD (1-3): Doesn't interfere with normal activities, abdomen soft and not tender to touch.     - MODERATE (4-7): Interferes with normal activities or awakens from sleep, abdomen tender to touch.     - SEVERE (8-10): Excruciating pain, doubled over, unable to do any normal activities.       8 yesterday, 3 today at time of call 7. RECURRENT SYMPTOM: "Have you ever had this type of stomach pain before?" If Yes, ask: "When was the last time?" and "What happened that time?"      denies 8. CAUSE: "What do you think is causing the stomach pain?"     Unsure, takes a lot of NSAIDS, hx of pancreatitis 9. RELIEVING/AGGRAVATING FACTORS: "What makes it better or worse?" (e.g., antacids, bending or twisting motion, bowel movement)     Pt takes malax, states it seems to help a little.  10. OTHER SYMPTOMS: "Do you have any other symptoms?" (e.g., back pain, diarrhea, fever, urination pain, vomiting)       denies  Protocols used: Abdominal Pain - Female-A-AH

## 2023-11-23 NOTE — Progress Notes (Signed)
 Acute Office Visit  Subjective:     Patient ID: Kaitlyn Parks, female    DOB: 08/08/1934, 89 y.o.   MRN: [id number]  Chief Complaint  Patient presents with   Abdominal Pain    Patient complains of upper and pelvic pain x1 day, black stools last week, questioned if the pain could be due to taking Ibuprofen around the clock for knee pain, seen by [facility] and given injection 3 weeks ago   Nausea   Stool Color Change    Black stools x1 day    Abdominal Pain Associated symptoms include constipation, melena and nausea. Pertinent negatives include no fever or weight loss.   Patient is in today for new onset abdominal pain. States that last week she had "black stool"  and she started having worsening abdominal pain epigastric and in the middle of the stomach under the belly button. Maybe some slight nausea but no vomiting. Has decreased appetite for the past day. States that the stool today was reddish/brownish. States that she has been more constipated this week as well. States she eats a lot of fruit so having constipation is not normal for her. She has been taking a lot of Maalox and also has been taking the pepcid  daily.   Pt reports she has been taking more ibuprofen for her knee pain than she normally does, went to [facility] and had an injection in her left knee, then also in her left SI joint within a week of each other.   Review of Systems  Constitutional:  Negative for chills, fever, malaise/fatigue and weight loss.  Gastrointestinal:  Positive for abdominal pain, constipation, melena and nausea.  All other systems reviewed and are negative.       Objective:    BP (!) 140/68   Pulse 65   Temp 98.2 F (36.8 C) (Oral)   Ht 5\' 3"  (1.6 m)   Wt 124 lb 8 oz (56.5 kg)   SpO2 98%   BMI 22.05 kg/m    Physical Exam Vitals reviewed.  Constitutional:      Appearance: Normal appearance. She is well-groomed and normal weight.  Cardiovascular:     Rate and Rhythm: Normal  rate and regular rhythm.     Heart sounds: S1 normal and S2 normal.  Pulmonary:     Effort: Pulmonary effort is normal.     Breath sounds: Normal breath sounds and air entry.  Abdominal:     General: Abdomen is flat. Bowel sounds are normal. There is no distension.     Palpations: Abdomen is soft.     Tenderness: There is no abdominal tenderness. There is no guarding.  Neurological:     Mental Status: She is alert and oriented to person, place, and time. Mental status is at baseline.     Gait: Gait is intact.  Psychiatric:        Mood and Affect: Mood and affect normal.        Speech: Speech normal.        Behavior: Behavior normal.        Judgment: Judgment normal.     No results found for any visits on 11/23/23.      Assessment & Plan:   Problem List Items Addressed This Visit   None Visit Diagnoses       Peptic ulcer disease    -  Primary   Relevant Medications   pantoprazole  (PROTONIX ) 40 MG tablet     Likely peptic ulcer  disease due to excessive ibuprofen use from chronic knee and back pain. Pt just had a CT abd 2 weeks ago which did not show any acute pathology, reviewed this with patient. Will start PPI and advised pt to stop NSAIDS, switch to tylenol  PRN. I advised she let me know how she is feeling over the next couple of weeks through mychart.   Meds ordered this encounter  Medications   pantoprazole  (PROTONIX ) 40 MG tablet    Sig: Take 1 tablet twice a day for 30 days, then decrease to 1 tablet daily.    Dispense:  60 tablet    Refill:  2    No follow-ups on file.  Aida House, MD

## 2023-11-30 ENCOUNTER — Ambulatory Visit: Payer: Self-pay | Admitting: *Deleted

## 2023-11-30 ENCOUNTER — Ambulatory Visit: Admitting: Family Medicine

## 2023-11-30 ENCOUNTER — Encounter: Payer: Self-pay | Admitting: Family Medicine

## 2023-11-30 VITALS — BP 110/62 | HR 52 | Temp 98.2°F | Ht 63.0 in | Wt 125.4 lb

## 2023-11-30 DIAGNOSIS — J301 Allergic rhinitis due to pollen: Secondary | ICD-10-CM

## 2023-11-30 DIAGNOSIS — K2101 Gastro-esophageal reflux disease with esophagitis, with bleeding: Secondary | ICD-10-CM

## 2023-11-30 DIAGNOSIS — K921 Melena: Secondary | ICD-10-CM

## 2023-11-30 NOTE — Patient Instructions (Signed)
 Consider over the counter Claritan or Allegra for allergy symptoms  May add over the counter Flonase  nasal as needed.

## 2023-11-30 NOTE — Progress Notes (Signed)
 Established Patient Office Visit  Subjective   Patient ID: Kaitlyn Parks, female    DOB: [birth year]  Age: 89 y.o. MRN: [id number]  Chief Complaint  Patient presents with   Sore Throat    Sore throat, nausea, mucus, headache, started a week ago     HPI   Kaitlyn Parks is seen as a work in with onset about a week ago sore throat, nausea without vomiting, increased mucus in her throat, and occasional headache.  No fever.  She thinks is may be pollen related.  She has occasional burning sensation in her throat.  She also relates about 9 days ago on Sunday she had drink that had some vodka and ginger beer and thinks she may have had some irritation following that.  She was recently placed on Protonix  for presumptive diagnosis of peptic ulcer disease.  Denies any hematemesis or melena.  Does have some intermittent constipation.  Denies any significant cough.  No purulent secretions.  No facial pain.  Past Medical History:  Diagnosis Date   Arthritis    DDD, scoliosis, sees Dr. Larrie Po for this, uses norco very rarely for pain   Bradycardia 11/11/2017   CAD (coronary artery disease)    LAD stenting of a 90% lesion 2012   Cystocele    Educated about COVID-19 virus infection 12/06/2019   Elevated cholesterol    GERD (gastroesophageal reflux disease)    dx in work up 2016 for atypical CP at OSH   pt. denies   Heart murmur    Hypertensive retinopathy    OU   Hypothyroidism    Interstitial cystitis    sees Dr. Inga Manges   Macular degeneration    OU   NSTEMI (non-ST elevated myocardial infarction) [facility])    Osteoporosis    Rectocele    S/P hip replacement, right 06/12/2017   Scoliosis    Thyroid  disease    Hypothyroid   Urinary incontinence    USI   Uterine prolapse    Past Surgical History:  Procedure Laterality Date   BLADDER SUSPENSION  2011   CATARACT EXTRACTION Bilateral 2015   Dr. Lasandra Points   CORONARY ANGIOPLASTY WITH STENT PLACEMENT  04/21/2010   LAD 80%, RCA 30%, nl EF, s/p DES  LAD   EYE SURGERY     LEFT HEART CATH AND CORONARY ANGIOGRAPHY N/A 06/14/2017   Procedure: LEFT HEART CATH AND CORONARY ANGIOGRAPHY;  Surgeon: Avanell Leigh, MD;  Location: [facility] INVASIVE [facility];  Service: Cardiovascular;  Laterality: N/A;   OOPHORECTOMY  2011   BSO   TOTAL HIP ARTHROPLASTY Right 06/10/2017   Procedure: RIGHT TOTAL HIP ARTHROPLASTY ANTERIOR APPROACH;  Surgeon: Adonica Hoose, MD;  Location: [facility] ORS;  Service: Orthopedics;  Laterality: Right;  Needs RNFA   VAGINAL HYSTERECTOMY  2011   [facility] BSO; benign    reports that she has never smoked. She has never used smokeless tobacco. She reports that she does not drink alcohol  and does not use drugs. family history includes Arthritis in her daughter; Breast cancer in her cousin, paternal aunt, and paternal aunt; Colon cancer (age of onset: 82) in her paternal aunt; Diabetes in her sister; Heart disease in her brother, father, mother, and sister; Hypertension in her mother; Leukemia in her paternal aunt; Lung cancer in her paternal grandfather and sister; Scoliosis in her sister; Uterine cancer in her sister. Allergies  Allergen Reactions   Acyclovir And Related Other (See Comments)    unknown   Ciprofloxacin  Other (See Comments)  dizziness   Pravachol  [Pravastatin  Sodium] Other (See Comments)    cystitis   Sulfa  Antibiotics     nausea   Zocor  [Simvastatin ] Other (See Comments)    cystitis    Review of Systems  Constitutional:  Negative for chills and fever.  HENT:  Positive for congestion and sore throat.   Respiratory:  Negative for cough.   Cardiovascular:  Negative for chest pain.  Neurological:  Positive for headaches.      Objective:     BP 110/62 (BP Location: Left Arm, Patient Position: Sitting, Cuff Size: Normal)   Pulse (!) 52   Temp 98.2 F (36.8 C) (Oral)   Ht 5\' 3"  (1.6 m)   Wt 125 lb 6.4 oz (56.9 kg)   SpO2 92%   BMI 22.21 kg/m  BP Readings from Last 3 Encounters:  11/30/23 110/62  11/23/23 (!)  140/68  10/28/23 (!) 122/56   Wt Readings from Last 3 Encounters:  11/30/23 125 lb 6.4 oz (56.9 kg)  11/23/23 124 lb 8 oz (56.5 kg)  10/28/23 127 lb 6.4 oz (57.8 kg)      Physical Exam Vitals reviewed.  Constitutional:      General: She is not in acute distress.    Appearance: She is not ill-appearing.  HENT:     Right Ear: Tympanic membrane normal.     Left Ear: Tympanic membrane normal.     Mouth/Throat:     Mouth: Mucous membranes are moist.     Pharynx: Oropharynx is clear. No oropharyngeal exudate or posterior oropharyngeal erythema.  Cardiovascular:     Rate and Rhythm: Normal rate and regular rhythm.  Pulmonary:     Effort: Pulmonary effort is normal.     Breath sounds: Normal breath sounds. No wheezing or rales.  Musculoskeletal:     Cervical back: Neck supple.  Lymphadenopathy:     Cervical: Cervical adenopathy present.  Neurological:     Mental Status: She is alert.      No results found for any visits on 11/30/23.    The ASCVD Risk score (Arnett DK, et al., 2019) failed to calculate for the following reasons:   The 2019 ASCVD risk score is only valid for ages 59 to 57   Risk score cannot be calculated because patient has a medical history suggesting prior/existing ASCVD    Assessment & Plan:   Patient presents with 1 week history of intermittent sore throat, headache, nasal congestion.  Suspect allergy related.  No fever.  Lung exam clear.  We recommend she try over-the-counter plain antihistamine such as Claritin or Allegra.  May add Flonase  nasal once daily as needed.  Follow-up for any fever or other worsening symptoms No follow-ups on file.    Glean Lamy, MD

## 2023-11-30 NOTE — Telephone Encounter (Signed)
 Spoke with the patient and informed her of the message below.  Attempted to schedule a lab appt and the patient questioned if she has strep due to having a sore throat  and nausea for over 1 week now.  Appt was scheduled with Dr Darren Em today to arrive at 2:30pm and she will go to the lab after the visit.

## 2023-11-30 NOTE — Addendum Note (Signed)
 Addended by: Aida House on: 11/30/2023 01:02 PM   Modules accepted: Orders

## 2023-11-30 NOTE — Telephone Encounter (Signed)
 I am concerned because she was complaining of melena at the last visit -- I had put her on PPI but I would like to have her come in for CBC and BMP-- orders placed-- please have patient come in for lab work

## 2023-11-30 NOTE — Telephone Encounter (Signed)
  Chief Complaint: Continued symptoms- under treatment for peptic ulcer Symptoms: nausea, burning in abdomen with sore throat, no energy Frequency: OV 4/22 Pertinent Negatives: Patient denies feeling faint- just tired and dizzy at times Disposition: [] ED /[] Urgent Care (no appt availability in office) / [] Appointment(In office/virtual)/ []  [facility]/ [] Home Care/ [x] Refused Recommended Disposition /[] [place] [facility]/ []  Follow-up with PCP Additional Notes: Patient is calling to see if there is anything else PCP can do to help with the burning sensation, help give her more energy- maybe vitamin- iron,  B. Offered appointment but patient requested message to provider- she will come to office if necessary.   Copied from CRM [id number]. Topic: Clinical - Red Word Triage >> Nov 30, 2023 10:18 AM Juluis Ok wrote: [facility] that prompted transfer to Nurse Triage: nausea, lightheaded-seen on 4/22 Reason for Disposition  [1] MODERATE dizziness (e.g., interferes with normal activities) AND [2] has been evaluated by doctor (or NP/PA) for this  Answer Assessment - Initial Assessment Questions 1. DESCRIPTION: "Describe your dizziness."     Headache- allergy, lightheaded- trying to eat enough, throat is sore, fatigue- ? Iron, vit B 2. LIGHTHEADED: "Do you feel lightheaded?" (e.g., somewhat faint, woozy, weak upon standing)     Not faint- just "sick" constant feeling of sickness, burning lower abdomen     8. CAUSE: "What do you think is causing the dizziness?"     Patient is being treated for peptic ulcer  10. OTHER SYMPTOMS: "Do you have any other symptoms?" (e.g., fever, chest pain, vomiting, diarrhea, bleeding)       Nausea, burning Following pancreatic diet- watching her red meat and white grains  Protocols used: Dizziness - Lightheadedness-A-AH

## 2023-12-01 LAB — CBC WITH DIFFERENTIAL/PLATELET
Basophils Absolute: 0 10*3/uL (ref 0.0–0.1)
Basophils Relative: 0.9 % (ref 0.0–3.0)
Eosinophils Absolute: 0.3 10*3/uL (ref 0.0–0.7)
Eosinophils Relative: 7.1 % — ABNORMAL HIGH (ref 0.0–5.0)
HCT: 38.9 % (ref 36.0–46.0)
Hemoglobin: 13 g/dL (ref 12.0–15.0)
Lymphocytes Relative: 14 % (ref 12.0–46.0)
Lymphs Abs: 0.7 10*3/uL (ref 0.7–4.0)
MCHC: 33.4 g/dL (ref 30.0–36.0)
MCV: 94.4 fl (ref 78.0–100.0)
Monocytes Absolute: 0.6 10*3/uL (ref 0.1–1.0)
Monocytes Relative: 13 % — ABNORMAL HIGH (ref 3.0–12.0)
Neutro Abs: 3.1 10*3/uL (ref 1.4–7.7)
Neutrophils Relative %: 65 % (ref 43.0–77.0)
Platelets: 301 10*3/uL (ref 150.0–400.0)
RBC: 4.12 Mil/uL (ref 3.87–5.11)
RDW: 13.6 % (ref 11.5–15.5)
WBC: 4.7 10*3/uL (ref 4.0–10.5)

## 2023-12-01 LAB — BASIC METABOLIC PANEL WITH GFR
BUN: 18 mg/dL (ref 6–23)
CO2: 28 meq/L (ref 19–32)
Calcium: 8.9 mg/dL (ref 8.4–10.5)
Chloride: 99 meq/L (ref 96–112)
Creatinine, Ser: 0.83 mg/dL (ref 0.40–1.20)
GFR: 63.72 mL/min (ref >60.00)
Glucose, Bld: 94 mg/dL (ref 70–99)
Potassium: 4.4 meq/L (ref 3.5–5.1)
Sodium: 134 meq/L — ABNORMAL LOW (ref 135–145)

## 2023-12-02 ENCOUNTER — Encounter: Payer: Self-pay | Admitting: Family Medicine

## 2023-12-13 ENCOUNTER — Telehealth: Payer: Self-pay | Admitting: Family Medicine

## 2023-12-13 NOTE — Telephone Encounter (Signed)
 Spoke with the patient and informed her of the message below.  Patient stated she is having constipation and is already taking Miralax .

## 2023-12-13 NOTE — Telephone Encounter (Signed)
 Copied from CRM [id number]. Topic: General - Other >> Dec 13, 2023 12:27 PM Magdalene School wrote: Reason for CRM: Patient calling to check on her bone density order because she has not heard back to schedule it and she will be having hip replacement surgery which the bone density results are needed for. >> Dec 13, 2023 12:50 PM Corin V wrote: Patient called again to advise she is scheduled with [facility] in [place] for the scan on 5/22. Please send the order over as soon as possible to ensure all information they need is received before the scan. There phone is [phone]

## 2023-12-13 NOTE — Telephone Encounter (Signed)
 I called [facility] at the number below for the fax number.  Previous bone density order was printed and faxed to [facility] at [phone] and per Alejandra this can take 24-48 hours to be received.  Spoke with the patient and informed her of this also.  Message sent to PCP as patient wanted to let Dr Bambi Lever know she was seen at [facility] this morning and hip surgery was recommended.  Also stated she was told problem with stools was from taking too much pain medication.

## 2023-12-13 NOTE — Telephone Encounter (Signed)
 Yes this was discussed with patient previously -- also we started protonix  -- is she having constipation? If so then she should try some OTC stool softeners or miralax  to help.

## 2023-12-15 ENCOUNTER — Telehealth: Payer: Self-pay

## 2023-12-15 NOTE — Telephone Encounter (Signed)
 Appointment scheduled for  01/21/24 @ 9am. Med rec and consent completed.

## 2023-12-15 NOTE — Telephone Encounter (Signed)
   Pre-operative Risk Assessment    Patient Name: Kaitlyn Parks  DOB: June 21, 1935 MRN: [id number]   Date of last office visit: 08/27/23 Eilleen Grates, MD Date of next office visit: NONE   Request for Surgical Clearance    Procedure:  LEFT TOTAL HIP ARTHROPLASTY  Date of Surgery:  Clearance TBD                                Surgeon:  DR Corrie Dills Surgeon's Group or Practice Name:  [facility] Phone number:  [phone] Fax number:  [phone]  ATTN: Valinda Gault WILLS   Type of Clearance Requested:   - Medical  - Pharmacy:  Hold Aspirin      Type of Anesthesia:  Spinal   Additional requests/questions:    SignedCollin Deal   12/15/2023, 11:20 AM

## 2023-12-15 NOTE — Telephone Encounter (Signed)
  Patient Consent for Virtual Visit        Kaitlyn Parks has provided verbal consent on 12/15/2023 for a virtual visit (video or telephone).  Med rec and consent completed. Appt on 01/23/24 @ 9:00am. Call patient at [phone]   CONSENT FOR VIRTUAL VISIT FOR:  Kaitlyn Parks  By participating in this virtual visit I agree to the following:  I hereby voluntarily request, consent and authorize [facility] and its employed or contracted physicians, physician assistants, nurse practitioners or other licensed health care professionals (the Practitioner), to provide me with telemedicine health care services (the "Services") as deemed necessary by the treating Practitioner. I acknowledge and consent to receive the Services by the Practitioner via telemedicine. I understand that the telemedicine visit will involve communicating with the Practitioner through live audiovisual communication technology and the disclosure of certain medical information by electronic transmission. I acknowledge that I have been given the opportunity to request an in-person assessment or other available alternative prior to the telemedicine visit and am voluntarily participating in the telemedicine visit.  I understand that I have the right to withhold or withdraw my consent to the use of telemedicine in the course of my care at any time, without affecting my right to future care or treatment, and that the Practitioner or I may terminate the telemedicine visit at any time. I understand that I have the right to inspect all information obtained and/or recorded in the course of the telemedicine visit and may receive copies of available information for a reasonable fee.  I understand that some of the potential risks of receiving the Services via telemedicine include:  Delay or interruption in medical evaluation due to technological equipment failure or disruption; Information transmitted may not be sufficient (e.g. poor  resolution of images) to allow for appropriate medical decision making by the Practitioner; and/or  In rare instances, security protocols could fail, causing a breach of personal health information.  Furthermore, I acknowledge that it is my responsibility to provide information about my medical history, conditions and care that is complete and accurate to the best of my ability. I acknowledge that Practitioner's advice, recommendations, and/or decision may be based on factors not within their control, such as incomplete or inaccurate data provided by me or distortions of diagnostic images or specimens that may result from electronic transmissions. I understand that the practice of medicine is not an exact science and that Practitioner makes no warranties or guarantees regarding treatment outcomes. I acknowledge that a copy of this consent can be made available to me via my patient portal [facility] MyChart), or I can request a printed copy by calling the office of [facility].    I understand that my insurance will be billed for this visit.   I have read or had this consent read to me. I understand the contents of this consent, which adequately explains the benefits and risks of the Services being provided via telemedicine.  I have been provided ample opportunity to ask questions regarding this consent and the Services and have had my questions answered to my satisfaction. I give my informed consent for the services to be provided through the use of telemedicine in my medical care

## 2023-12-15 NOTE — Telephone Encounter (Signed)
   Name: MATIGAN PUJOLS  DOB: 26-Feb-1935  MRN: [id number]  Primary Cardiologist: Eilleen Grates, MD   Preoperative team, please contact this patient and set up a phone call appointment for further preoperative risk assessment. Please obtain consent and complete medication review. Thank you for your help.  I confirm that guidance regarding antiplatelet and oral anticoagulation therapy has been completed and, if necessary, noted below.  Regarding ASA therapy, we recommend continuation of ASA throughout the perioperative period.  However, if the surgeon feels that cessation of ASA is required in the perioperative period, it may be stopped 5-7 days prior to surgery with a plan to resume it as soon as felt to be feasible from a surgical standpoint in the post-operative period.  I also confirmed the patient resides in the state of [place] . As per [facility] Medical Board telemedicine laws, the patient must reside in the state in which the provider is licensed.   Jude Norton, NP 12/15/2023, 1:51 PM [facility]

## 2023-12-23 NOTE — Progress Notes (Signed)
 [facility] - Clinic Note  01/03/2024    CHIEF COMPLAINT Patient presents for Retina Follow Up  HISTORY OF PRESENT ILLNESS: Kaitlyn Parks is a 89 y.o. female who presents to the clinic today for:   HPI     Retina Follow Up   Patient presents with  Wet AMD.  In left eye.  Severity is moderate.  Duration of 7 weeks.  Since onset it is stable.  I, the attending physician,  performed the HPI with the patient and updated documentation appropriately.        Comments   Pt here for 7 wk ret f/u exu ARMD OS. Pt states VA is stable, cannot tell much difference.       Last edited by Ronelle Coffee, MD on 01/03/2024 11:10 PM.     Pt states   Referring physician: Aida House, MD [address] [place],  [place] [address]  HISTORICAL INFORMATION:  Selected notes from the MEDICAL RECORD NUMBER Referred by Dr. Jordan DeMarco for concern of exu ARMD LEE: 11.26.19 (J. 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   Perfluorohexyloctane (MIEBO OP) Apply 1 drop to eye daily.   Polyethyl Glycol-Propyl Glycol (SYSTANE) 0.4-0.3 % GEL ophthalmic gel Place 1 Application into both eyes daily as needed (dry eyes).   No current facility-administered medications for this visit. (Ophthalmic Drugs)   Current Outpatient Medications (Other)  Medication Sig   alum & mag hydroxide-simeth (MAALOX PLUS) 400-400-40 MG/5ML suspension Take 15 mLs by mouth every 6 (six) hours as needed for indigestion.   amLODipine  (NORVASC ) 2.5 MG tablet Take 1 tablet (2.5 mg total) by mouth daily.   aspirin  EC 81 MG tablet Take 81 mg by mouth daily.   Biotin w/ Vitamins C & E (HAIR/SKIN/NAILS PO) Take 1 tablet by mouth daily.    Cranberry 400 MG CAPS Take 400 mg by mouth daily.   hydrALAZINE  (APRESOLINE ) 10 MG tablet TAKE (1) TABLET TWICE A DAY. MAY TAKE EXTRA DOSE FOR SBP >160 UP TO A TOTAL OF 4 TIMES A DAY AS NEEDED   isosorbide  mononitrate (IMDUR ) 60 MG 24 hr tablet TAKE 1 & 1/2 TABLETS by mouth once A DAY.   Multiple Vitamins-Calcium  (ONE-A-DAY WOMENS PO) Take 1 tablet by mouth daily.    nitroGLYCERIN  (NITROSTAT ) 0.4 MG SL tablet DISSOLVE 1 TABLET UNDER TONGUE IF NEEDED FOR CHEST PAIN. MAY REPEAT IN 5 MINUTES FOR 3 DOSES.   NON FORMULARY Get steroid inject for SI joint every 12 wks with Dr. Lossie Roughen surgery spine   pantoprazole  (PROTONIX ) 40 MG tablet Take 1 tablet twice a day for 30 days, then decrease to 1 tablet daily.   PRESCRIPTION MEDICATION Avastin  eye injections given by Dr Karyl Paget due to macular degeneration . Every 7wks.   rosuvastatin  (CRESTOR ) 10 MG tablet TABLET ONE-HALF TABLET BY MOUTH DAILY   SYNTHROID  50 MCG tablet TAKE 1 TABLET EACH DAY. NEED APPOINTMENT FOR ADDITIONAL REFILLS   No current facility-administered medications for this visit. (Other)   REVIEW OF SYSTEMS: ROS   Positive for: Gastrointestinal, Genitourinary, Musculoskeletal, Cardiovascular, Eyes Negative for: Constitutional, Neurological, Skin, HENT, Endocrine, Respiratory, Psychiatric, Allergic/Imm, Heme/Lymph Last edited by Anthony Bateman, COT on 01/03/2024  1:14 PM.  ALLERGIES Allergies  Allergen Reactions   Acyclovir And Related Other (See Comments)    unknown   Ciprofloxacin  Other (See Comments)    dizziness   Pravachol  [Pravastatin  Sodium] Other (See Comments)    cystitis   Sulfa  Antibiotics     nausea   Zocor  [Simvastatin ] Other (See Comments)    cystitis   PAST MEDICAL HISTORY Past Medical History:  Diagnosis Date   Arthritis    DDD, scoliosis, sees Dr. Larrie Po for this, uses norco very rarely for pain   Bradycardia 11/11/2017   CAD (coronary artery disease)    LAD stenting of a 90% lesion 2012    Cystocele    Educated about COVID-19 virus infection 12/06/2019   Elevated cholesterol    GERD (gastroesophageal reflux disease)    dx in work up 2016 for atypical CP at OSH   pt. denies   Heart murmur    Hypertensive retinopathy    OU   Hypothyroidism    Interstitial cystitis    sees Dr. Inga Manges   Macular degeneration    OU   NSTEMI (non-ST elevated myocardial infarction) [facility])    Osteoporosis    Rectocele    S/P hip replacement, right 06/12/2017   Scoliosis    Thyroid  disease    Hypothyroid   Urinary incontinence    USI   Uterine prolapse    Past Surgical History:  Procedure Laterality Date   BLADDER SUSPENSION  2011   CATARACT EXTRACTION Bilateral 2015   Dr. Lasandra Points   CORONARY ANGIOPLASTY WITH STENT PLACEMENT  04/21/2010   LAD 80%, RCA 30%, nl EF, s/p DES LAD   EYE SURGERY     LEFT HEART CATH AND CORONARY ANGIOGRAPHY N/A 06/14/2017   Procedure: LEFT HEART CATH AND CORONARY ANGIOGRAPHY;  Surgeon: Avanell Leigh, MD;  Location: [facility]ASIVE [facility];  Service: Cardiovascular;  Laterality: N/A;   OOPHORECTOMY  2011   BSO   TOTAL HIP ARTHROPLASTY Right 06/10/2017   Procedure: RIGHT TOTAL HIP ARTHROPLASTY ANTERIOR APPROACH;  Surgeon: Adonica Hoose, MD;  Location: [facility] ORS;  Service: Orthopedics;  Laterality: Right;  Needs RNFA   VAGINAL HYSTERECTOMY  2011   LAVH BSO; benign   FAMILY HISTORY Family History  Problem Relation Age of Onset   Hypertension Mother    Heart disease Mother    Heart disease Father    Heart disease Sister    Diabetes Sister    Uterine cancer Sister        mets to lungs   Lung cancer Sister    Scoliosis Sister    Heart disease Brother    Colon cancer Paternal Aunt 83   Breast cancer Paternal Aunt        Age 29's   Breast cancer Paternal Aunt    Leukemia Paternal Aunt    Lung cancer Paternal Grandfather        smoker   Arthritis Daughter    Breast cancer Cousin        Maternal 1st cousins-Age 31's   Esophageal cancer Neg Hx    Pancreatic  cancer Neg Hx    Stomach cancer Neg Hx    SOCIAL HISTORY Social History   Tobacco Use   Smoking status: Never   Smokeless tobacco: Never  Vaping Use   Vaping status: Never Used  Substance Use Topics   Alcohol  use: No    Comment: Rare   Drug use: No       OPHTHALMIC EXAM: Base Eye Exam  Visual Acuity (Snellen - Linear)       Right Left   Dist [place] 20/40 20/40   Dist ph [place] 20/30 -2 20/30 -1         Tonometry (Tonopen, 1:20 PM)       Right Left   Pressure 15 15         Pupils       Pupils Dark Light Shape React APD   Right PERRL 3 2 Round Brisk None   Left PERRL 3 2 Round Brisk None         Visual Fields (Counting fingers)       Left Right    Full Full         Extraocular Movement       Right Left    Full, Ortho Full, Ortho         Neuro/Psych     Oriented x3: Yes   Mood/Affect: Normal         Dilation     Both eyes: 1.0% Mydriacyl, 2.5% Phenylephrine @ 1:20 PM           Slit Lamp and Fundus Exam     Slit Lamp Exam       Right Left   Lids/Lashes mild Telangiectasia -- improved, marginal lesion nasal UL Dermatochalasis - upper lid   Conjunctiva/Sclera White and quiet White and quiet, inferior conj chalasis   Cornea Arcus, 2+ fine Punctate epithelial erosions, tear film debris, decreased TBUT arcus, tear film debris, 2+ inferior Punctate epithelial erosions   Anterior Chamber Deep and clear Deep and clear   Iris Round and dilated Round and well dilated   Lens PC IOL in good position, 1+Posterior capsular opacification IN PC IOL in good position with open PC   Anterior Vitreous Vitreous syneresis, Posterior vitreous detachment Vitreous syneresis, Posterior vitreous detachment, vitreous condensations         Fundus Exam       Right Left   Disc Pink and Sharp, Compact, focal PPP Pink and Sharp, mild temporal PPA/PPP   C/D Ratio 0.3 0.3   Macula Blunted foveal reflex, Drusen, RPE mottling and clumping, early Atrophy, +PEDs  -- stably improved, trace cystic changes -- stably improved, No frank heme, no edema Blunted foveal reflex, +drusen, pigment clumping, RPE mottling, clumping and atrophy, no heme, central PED / CNV with overlying cystic changes -- stably improved, +GA   Vessels attenuated, Tortuous attenuated, Tortuous   Periphery Attached, scattered reticular degeneration Attached, scattered reticular degeneration           IMAGING AND PROCEDURES  Imaging and Procedures for @TODAY @  OCT, Retina - OU - Both Eyes        Right Eye Quality was good. Central Foveal Thickness: 236. Progression has been stable. Findings include no IRF, no SRF, abnormal foveal contour, retinal drusen , intraretinal hyper-reflective material, pigment epithelial detachment, outer retinal atrophy (stable improvement in cystic changes nasal and temporal fovea, patchy ORA / GA -- no fluid).   Left Eye Quality was good. Central Foveal Thickness: 255. Progression has been stable. Findings include normal foveal contour, no IRF, no SRF, retinal drusen , intraretinal hyper-reflective material, pigment epithelial detachment, outer retinal atrophy (Stable improvement in IRF/cystic changes inferior fovea, stable improvement in [facility] / PED inferior to fovea ).   Notes  *Images captured and stored on drive  Diagnosis / Impression:  OD: exudative ARMD -- stable improvement in cystic changes nasal and temporal fovea, patchy ORA / GA --  no fluid OS: exu-ARMD -- Stable improvement in IRF/cystic changes inferior fovea, stable improvement in [facility] / PED inferior to fovea   Clinical management:  See below  Abbreviations: NFP - Normal foveal profile. CME - cystoid macular edema. PED - pigment epithelial detachment. IRF - intraretinal fluid. SRF - subretinal fluid. EZ - ellipsoid zone. ERM - epiretinal membrane. ORA - outer retinal atrophy. ORT - outer retinal tubulation. SRHM - subretinal hyper-reflective material      Intravitreal Injection,  Pharmacologic Agent - OS - Left Eye       Time Out 01/03/2024. 2:49 PM. Confirmed correct patient, procedure, site, and patient consented.   Anesthesia Topical anesthesia was used. Anesthetic medications included Lidocaine  2%, Proparacaine 0.5%.   Procedure Preparation included 5% betadine  to ocular surface, eyelid speculum. A (32g) needle was used.   Injection: 2 mg aflibercept  2 MG/0.05ML   Route: Intravitreal, Site: Left Eye   NDC: Q956576, Lot: [id number], Expiration date: 04/02/2025, Waste: 0 mL   Post-op Post injection exam found visual acuity of at least counting fingers. The patient tolerated the procedure well. There were no complications. The patient received written and verbal post procedure care education. Post injection medications were not given.            ASSESSMENT/PLAN:    ICD-10-CM   1. Exudative age-related macular degeneration of left eye with active choroidal neovascularization (HCC)  H35.3221 OCT, Retina - OU - Both Eyes    Intravitreal Injection, Pharmacologic Agent - OS - Left Eye    aflibercept  (EYLEA ) SOLN 2 mg    2. Exudative age-related macular degeneration of right eye with active choroidal neovascularization (HCC)  H35.3211     3. Essential hypertension  I10     4. Hypertensive retinopathy of both eyes  H35.033     5. Pseudophakia of both eyes  Z96.1     6. Dry eyes  H04.123     7. Left posterior capsular opacification  H26.492      1. Exudative age-related macular degeneration, left eye  - OCT 4.30.2020 showed interval conversion of OS from nonexudative ARMD to exu-ARMD with large dome-shaped PED with overlying IRF/SRF  - FA 05.28.20 -- no active CNV OS, just staining - s/p IVA OS #1 (04.30.20), #2 (05.28.20), #3 (06.26.20), #4 (08.03.20), #5 (09.11.20), #6 (10.19.20), #7 (11.23.20) - reactivation of CNV noted on 05.05.22 -- s/p IVA OS  #8 (05.05.22), #9 (06.06.22), #10 (07.19.22), #11 (09.13.22), #12 (10.25.22), #13 (12.13.22), #14  (01.30.23), #15 (03.20.23), #16 (05.08.23), #17 (06.19.23), #18 (08.07.23), #19 (10.02.23), #20 (11.20.23), #21 (01.15.24), #22 (12.19.24), #23 (02.21.25) =========================================== - s/p IVE OS #1 (03.11.24), #2 (04.22.24), #3 (05.29.24), #4 (07.22.24), #5 (09.09.24), #6 (10.28.24), #7 (12.19.24), #8 (04.18.25) - **Interval increase in IRF overlying PED at 8 wks on 09.13.22 visit (IVA)**  - BCVA OS 20/30 from 20/40 at 7 wks - OCT shows Stable improvement in IRF/cystic changes inferior fovea, stable improvement in [facility] / PED inferior to fovea at 7 weeks  - recommend IVE OS #9 today, 06.02.25. with follow up at 7 weeks again  - pt wishes to proceed with injection  - RBA of procedure discussed, questions answered - see procedure note - IVE informed consent obtained and signed, 03.11.24  - benefits investigation for Eylea  initiated 4.30.2020 -- approved for 2025 but responsible for 20% coinsurance on medications  - f/u in 7 weeks -- DFE/OCT, possible injection  2. Age related macular degeneration, exudative, OD - interval development of IRF first noted on  09.11.20 -- conversion from nonexudative to exudative ARMD - pt initially presented on 11.27.19 due to alert from [employer] home monitoring system for OD  - [facility] prescribed by Dr. Lasandra Points - FA (5.28.2020) showed staining / window defect corresponding temporal RPE changes OU -- no active CNV OU - S/P IVA OD #1 (09.11.20), #2 (10.19.20), #3 (11.23.20), #4 (01.07.21), #5 (2.19.21), #6 (04.02.21), #7 (05.28.21), #8 (07.23.21), #9 (09.24.21), #10 (11.29.21), #11 (02.11.22), #12 (05.05.22), #13 (7.19.22), #14 (09.13.22), #15 (10.02.23), #16 (11.20.23), #17 (01.15.24), #18 (03.11.24) - OCT shows stable improvement in cystic changes nasal and temporal fovea, patchy ORA / GA -- no fluid  - BCVA OD stable at 20/30 (15+ mos since last injxn)  - recommend holding IVA OD again today -- will treat PRN - IVA informed consent obtained and  signed, 05.08.23 (OU) - see procedure note  - f/u 7 weeks DFE, OCT  3,4. Hypertensive retinopathy OU  - discussed importance of tight BP control  - monitor - monitor  5. Pseudophakia OU  - s/p CE/IOL OU  - beautiful surgeries, doing well  - monitor  6. Dry eyes OU  - recommend artificial tears and lubricating ointment as needed  - using Miebo and Lumify per Dr. Micael Adas  7. PCO OU (OS > OD)  - s/p Yag Cap OS 07.31.24 - BCVA OS 20/30 and pleased with the result     Ophthalmic Meds Ordered this visit:  Meds ordered this encounter  Medications   aflibercept  (EYLEA ) SOLN 2 mg     Return in about 7 weeks (around 02/21/2024) for f/u exu ARMD OU, DFE, OCT, Possible Injxn.  There are no Patient Instructions on file for this visit.  This document serves as a record of services personally performed by Jeanice Millard, MD, PhD. It was created on their behalf by Mayola Specking, COA an ophthalmic technician. The creation of this record is the provider's dictation and/or activities during the visit.   Electronically signed by: Carrington Clack, COT  01/03/24  11:18 PM   This document serves as a record of services personally performed by Jeanice Millard, MD, PhD. It was created on their behalf by Morley Arabia. Bevin Bucks, OA an ophthalmic technician. The creation of this record is the provider's dictation and/or activities during the visit.    Electronically signed by: Morley Arabia. Bevin Bucks, OA 01/03/24 11:18 PM    Jeanice Millard, M.D., Ph.D. Diseases & Surgery of the Retina and Vitreous [facility] 01/03/2024       Abbreviations: M myopia (nearsighted); A astigmatism; H hyperopia (farsighted); P presbyopia; Mrx spectacle prescription;  CTL contact lenses; OD right eye; OS left eye; OU both eyes  XT exotropia; ET esotropia; PEK punctate epithelial keratitis; PEE punctate epithelial erosions; DES dry eye syndrome; MGD meibomian gland dysfunction; ATs artificial tears;  PFAT's preservative free artificial tears; NSC nuclear sclerotic cataract; PSC posterior subcapsular cataract; ERM epi-retinal membrane; PVD posterior vitreous detachment; RD retinal detachment; DM diabetes mellitus; DR diabetic retinopathy; NPDR non-proliferative diabetic retinopathy; PDR proliferative diabetic retinopathy; CSME clinically significant macular edema; DME diabetic macular edema; dbh dot blot hemorrhages; CWS cotton wool spot; POAG primary open angle glaucoma; C/D cup-to-disc ratio; HVF humphrey visual field; GVF goldmann visual field; OCT optical coherence tomography; IOP intraocular pressure; BRVO Branch retinal vein occlusion; CRVO central retinal vein occlusion; CRAO central retinal artery occlusion; BRAO branch retinal artery occlusion; RT retinal tear; SB scleral buckle; PPV pars plana vitrectomy; VH Vitreous hemorrhage; PRP panretinal  laser photocoagulation; IVK intravitreal kenalog ; VMT vitreomacular traction; MH Macular hole;  NVD neovascularization of the disc; NVE neovascularization elsewhere; AREDS age related eye disease study; ARMD age related macular degeneration; POAG primary open angle glaucoma; EBMD epithelial/anterior basement membrane dystrophy; ACIOL anterior chamber intraocular lens; IOL intraocular lens; PCIOL posterior chamber intraocular lens; Phaco/IOL phacoemulsification with intraocular lens placement; PRK photorefractive keratectomy; LASIK laser assisted in situ keratomileusis; HTN hypertension; DM diabetes mellitus; COPD chronic obstructive pulmonary disease

## 2024-01-03 ENCOUNTER — Ambulatory Visit (INDEPENDENT_AMBULATORY_CARE_PROVIDER_SITE_OTHER): Admitting: Ophthalmology

## 2024-01-03 ENCOUNTER — Encounter (INDEPENDENT_AMBULATORY_CARE_PROVIDER_SITE_OTHER): Payer: Self-pay | Admitting: Ophthalmology

## 2024-01-03 ENCOUNTER — Ambulatory Visit: Attending: Cardiovascular Disease

## 2024-01-03 DIAGNOSIS — H35033 Hypertensive retinopathy, bilateral: Secondary | ICD-10-CM

## 2024-01-03 DIAGNOSIS — H353211 Exudative age-related macular degeneration, right eye, with active choroidal neovascularization: Secondary | ICD-10-CM

## 2024-01-03 DIAGNOSIS — Z961 Presence of intraocular lens: Secondary | ICD-10-CM | POA: Diagnosis not present

## 2024-01-03 DIAGNOSIS — I10 Essential (primary) hypertension: Secondary | ICD-10-CM

## 2024-01-03 DIAGNOSIS — Z01810 Encounter for preprocedural cardiovascular examination: Secondary | ICD-10-CM

## 2024-01-03 DIAGNOSIS — H26492 Other secondary cataract, left eye: Secondary | ICD-10-CM

## 2024-01-03 DIAGNOSIS — H353221 Exudative age-related macular degeneration, left eye, with active choroidal neovascularization: Secondary | ICD-10-CM

## 2024-01-03 DIAGNOSIS — H353231 Exudative age-related macular degeneration, bilateral, with active choroidal neovascularization: Secondary | ICD-10-CM

## 2024-01-03 DIAGNOSIS — H04123 Dry eye syndrome of bilateral lacrimal glands: Secondary | ICD-10-CM

## 2024-01-03 MED ORDER — AFLIBERCEPT 2MG/0.05ML IZ SOLN FOR KALEIDOSCOPE
2.0000 mg | INTRAVITREAL | Status: AC | PRN
  Administered 2024-01-03: 2 mg via INTRAVITREAL

## 2024-01-03 NOTE — Progress Notes (Signed)
 Virtual Visit via Telephone Note   Because of Kaitlyn Parks co-morbid illnesses, she is at least at moderate risk for complications without adequate follow up.  This format is felt to be most appropriate for this patient at this time.  Due to technical limitations with video connection (technology), today's appointment will be conducted as an audio only telehealth visit, and Kaitlyn Parks verbally agreed to proceed in this manner.   All issues noted in this document were discussed and addressed.  No physical exam could be performed with this format.  Evaluation Performed:  Preoperative cardiovascular risk assessment _____________   Date:  01/03/2024   Patient ID:  Kaitlyn Parks, Kaitlyn Parks Jun 22, 1935, MRN [id number] Patient Location:  Home Provider location:   Office  Primary Care Provider:  Aida House, MD Primary Cardiologist:  Eilleen Grates, MD  Chief Complaint / Patient Profile  89 y.o. y/o female with a h/o hypothyroidism, hypertension, hyperlipidemia and CAD (PCI of LAD in 2012) who is pending left total hip arthroplasty and presents today for telephonic preoperative cardiovascular risk assessment. History of Present Illness  Kaitlyn Parks is a 89 y.o. female who presents via audio/video conferencing for a telehealth visit today.  Pt was last seen in cardiology clinic on 08/27/23 by Kaitlyn Parks.  At that time Kaitlyn Parks was doing well.  The patient is now pending procedure as outlined above. Since her last visit, she has remained stable from a cardiac standpoint. She is able to achieve greater than 4 METs of activity.   Today she denies chest pain, shortness of breath, lower extremity edema, fatigue, palpitations, melena, hematuria, hemoptysis, diaphoresis, weakness, presyncope, syncope, orthopnea, and PND.  Past Medical History    Past Medical History:  Diagnosis Date   Arthritis    DDD, scoliosis, sees Kaitlyn Parks for this, uses norco very rarely for pain   Bradycardia  11/11/2017   CAD (coronary artery disease)    LAD stenting of a 90% lesion 2012   Cystocele    Educated about COVID-19 virus infection 12/06/2019   Elevated cholesterol    GERD (gastroesophageal reflux disease)    dx in work up 2016 for atypical CP at OSH   pt. denies   Heart murmur    Hypertensive retinopathy    OU   Hypothyroidism    Interstitial cystitis    sees Kaitlyn Parks   Macular degeneration    OU   NSTEMI (non-ST elevated myocardial infarction) [facility])    Osteoporosis    Rectocele    S/P hip replacement, right 06/12/2017   Scoliosis    Thyroid  disease    Hypothyroid   Urinary incontinence    USI   Uterine prolapse    Past Surgical History:  Procedure Laterality Date   BLADDER SUSPENSION  2011   CATARACT EXTRACTION Bilateral 2015   Kaitlyn Parks   CORONARY ANGIOPLASTY WITH STENT PLACEMENT  04/21/2010   LAD 80%, RCA 30%, nl EF, s/p DES LAD   EYE SURGERY     LEFT HEART CATH AND CORONARY ANGIOGRAPHY N/A 06/14/2017   Procedure: LEFT HEART CATH AND CORONARY ANGIOGRAPHY;  Surgeon: Avanell Leigh, MD;  Location: [facility];  Service: Cardiovascular;  Laterality: N/A;   OOPHORECTOMY  2011   BSO   TOTAL HIP ARTHROPLASTY Right 06/10/2017   Procedure: RIGHT TOTAL HIP ARTHROPLASTY ANTERIOR APPROACH;  Surgeon: Adonica Hoose, MD;  Location: [facility];  Service: Orthopedics;  Laterality: Right;  Needs RNFA   VAGINAL  HYSTERECTOMY  2011   LAVH BSO; benign   Allergies Allergies  Allergen Reactions   Acyclovir And Related Other (See Comments)    unknown   Ciprofloxacin  Other (See Comments)    dizziness   Pravachol  [Pravastatin  Sodium] Other (See Comments)    cystitis   Sulfa  Antibiotics     nausea   Zocor  [Simvastatin ] Other (See Comments)    cystitis   Home Medications    Prior to Admission medications   Medication Sig Start Date End Date Taking? Authorizing Provider  alum & mag hydroxide-simeth (MAALOX PLUS) 400-400-40 MG/5ML suspension Take 15 mLs by mouth every 6  (six) hours as needed for indigestion. 04/23/22   Mesner, Reymundo Caulk, MD  amLODipine  (NORVASC ) 2.5 MG tablet Take 1 tablet (2.5 mg total) by mouth daily. 01/22/23   Aida House, MD  aspirin  EC 81 MG tablet Take 81 mg by mouth daily.    [provider]  Biotin w/ Vitamins C & E (HAIR/SKIN/NAILS Parks) Take 1 tablet by mouth daily.    [provider]  Brimonidine Tartrate (LUMIFY) 0.025 % SOLN Apply 1 drop to eye 6 (six) times daily.    [provider]  Cranberry 400 MG CAPS Take 400 mg by mouth daily.    [provider]  hydrALAZINE  (APRESOLINE ) 10 MG tablet TAKE (1) TABLET TWICE A DAY. MAY TAKE EXTRA DOSE FOR SBP >160 UP TO A TOTAL OF 4 TIMES A DAY AS NEEDED 09/09/23   Eilleen Grates, MD  isosorbide  mononitrate (IMDUR ) 60 MG 24 hr tablet TAKE 1 & 1/2 TABLETS by mouth once A DAY. 10/30/23   Aida House, MD  Multiple Vitamins-Calcium  (ONE-A-DAY WOMENS Parks) Take 1 tablet by mouth daily.     [provider]  nitroGLYCERIN  (NITROSTAT ) 0.4 MG SL tablet DISSOLVE 1 TABLET UNDER TONGUE IF NEEDED FOR CHEST PAIN. MAY REPEAT IN 5 MINUTES FOR 3 DOSES. 03/05/23   Eilleen Grates, MD  NON FORMULARY Get steroid inject for SI joint every 12 wks with Kaitlyn Parks surgery spine    [provider]  pantoprazole  (PROTONIX ) 40 MG tablet Take 1 tablet twice a day for 30 days, then decrease to 1 tablet daily. 11/23/23   Aida House, MD  Perfluorohexyloctane (MIEBO OP) Apply 1 drop to eye daily.    [provider]  Polyethyl Glycol-Propyl Glycol (SYSTANE) 0.4-0.3 % GEL ophthalmic gel Place 1 Application into both eyes daily as needed (dry eyes).    [provider]  PRESCRIPTION MEDICATION Avastin  eye injections given by Dr Karyl Parks due to macular degeneration . Every 7wks.    [provider]  rosuvastatin  (CRESTOR ) 10 MG tablet TABLET ONE-HALF TABLET BY MOUTH DAILY 10/22/23   Eilleen Grates, MD  SYNTHROID  50 MCG tablet TAKE 1 TABLET  EACH DAY. NEED APPOINTMENT FOR ADDITIONAL REFILLS 10/20/23   Aida House, MD   Physical Exam  Vital Signs:  Kaitlyn Parks does not have vital signs available for review today. Given telephonic nature of communication, physical exam is limited. AAOx3. NAD. Normal affect.  Speech and respirations are unlabored. Accessory Clinical Findings  None Assessment & Plan    1.  Preoperative Cardiovascular Risk Assessment: Left total hip arthroplasty with Kaitlyn Parks.   Ms. Kreher perioperative risk of a major cardiac event is 0.9% according to the Revised Cardiac Risk Index (RCRI).  Therefore, she is at low risk for perioperative complications.   Her functional capacity is good at 7.71 METs according to the  D[facility] Activity Status Index (DASI). Recommendations: According to ACC/AHA guidelines, no further cardiovascular testing needed.  The patient may proceed to surgery at acceptable risk.   Antiplatelet and/or Anticoagulation Recommendations: Regarding ASA therapy, we recommend continuation of ASA throughout the perioperative period. However, if the surgeon feels that cessation of ASA is required in the perioperative period, it may be stopped 5-7 days prior to surgery with a plan to resume it as soon as felt to be feasible from a surgical standpoint in the post-operative period.   The patient was advised that if she develops new symptoms prior to surgery to contact our office to arrange for a follow-up visit, and she verbalized understanding.  A copy of this note will be routed to requesting surgeon.  Time:   Today, I have spent 9 minutes with the patient with telehealth technology discussing medical history, symptoms, and management plan.    Vayda Dungee D Leba Tibbitts, NP  01/03/2024, 9:09 AM

## 2024-01-05 ENCOUNTER — Telehealth: Payer: Self-pay

## 2024-01-05 ENCOUNTER — Encounter: Payer: Self-pay | Admitting: Adult Health

## 2024-01-05 DIAGNOSIS — M810 Age-related osteoporosis without current pathological fracture: Secondary | ICD-10-CM

## 2024-01-05 NOTE — Telephone Encounter (Signed)
 Patient informed of the results as below.  Patient stated she was on Prolia  injections 6 years ago after 1st hip replacement and orthopedic surgeon advised she stop taking these.  Stated exact reason was not given and she was seen by Dr Aldona Amel (endo) who did not agree  and she never followed up.  Patient wanted to let PCP know she is scheduled for the second hip replacement on 6/26 with [facility].  Message sent to PCP for further recommendations.

## 2024-01-05 NOTE — Telephone Encounter (Signed)
 Ok we will need to wait until after her hip replacement but if Dr. Aldona Amel disagreed with the orthopedist then I would recommend sending her back to Dr. Aldona Amel ot discuss what treatment she will need-- would she be agreeable to a referral back to Dr. Aldona Amel?

## 2024-01-05 NOTE — Telephone Encounter (Signed)
 Copied from CRM [id number]. Topic: Clinical - Lab/Test Results >> Jan 05, 2024 11:47 AM Kaitlyn Parks wrote: Reason for CRM: Patient is calling to verify if her bone density results have been received, She had the exam done about two weeks ago at [facility].

## 2024-01-05 NOTE — Telephone Encounter (Signed)
 Spoke with the patient and informed her of the message below. Message sent to PCP as the patient agreed to the referral and is aware someone will contact her with appointment info.

## 2024-01-05 NOTE — Telephone Encounter (Signed)
 Report viewed, she does have osteoporosis and I do not see where she is on treatment for this.Aaron Aas would she be willing to start treatment? I would do prolia  injections

## 2024-01-06 NOTE — Telephone Encounter (Signed)
 Referral placed as below.

## 2024-01-06 NOTE — Addendum Note (Signed)
 Addended by: Henry Loge on: 01/06/2024 01:27 PM   Modules accepted: Orders

## 2024-01-06 NOTE — Telephone Encounter (Signed)
 Ok to place referral to Dr. Aldona Amel for osteoporosis

## 2024-01-10 NOTE — Progress Notes (Signed)
 Sent message, via epic in basket, requesting orders in epic from Careers adviser.

## 2024-01-13 ENCOUNTER — Ambulatory Visit: Payer: Self-pay | Admitting: Student

## 2024-01-14 ENCOUNTER — Ambulatory Visit: Payer: Self-pay | Admitting: Student

## 2024-01-14 NOTE — H&P (View-Only) (Signed)
 TOTAL HIP ADMISSION H&P  Patient is admitted for left total hip arthroplasty.  Subjective:  Chief Complaint: left hip pain  HPI: Kaitlyn Parks, 89 y.o. female, has a history of pain and functional disability in the left hip(s) due to arthritis and patient has failed non-surgical conservative treatments for greater than 12 weeks to include NSAID's and/or analgesics, corticosteriod injections, flexibility and strengthening excercises, use of assistive devices, and activity modification.  Onset of symptoms was gradual starting 10 years ago with rapidlly worsening course since that time.The patient noted no past surgery on the left hip(s).  Patient currently rates pain in the left hip at 10 out of 10 with activity. Patient has night pain, worsening of pain with activity and weight bearing, trendelenberg gait, pain that interfers with activities of daily living, and pain with passive range of motion. Patient has evidence of subchondral cysts, subchondral sclerosis, periarticular osteophytes, and joint space narrowing by imaging studies. This condition presents safety issues increasing the risk of falls.  There is no current active infection.  Patient Active Problem List   Diagnosis Date Noted   Pancreatitis, acute 06/14/2022   Abdominal pain 06/13/2022   Acute pancreatitis 06/13/2022   Hyponatremia 06/13/2022   Gastritis 06/11/2022   Aortic atherosclerosis (HCC) 12/07/2019   Macular degeneration 01/11/2019   Coronary artery disease involving native coronary artery of native heart without angina pectoris 10/29/2018   Presence of intraocular lens 09/23/2018   Bradycardia 11/11/2017   Dyslipidemia 11/11/2017   CAD (coronary artery disease) 06/12/2017   GERD (gastroesophageal reflux disease) 06/12/2017   S/P hip replacement, right 06/12/2017   Osteoarthritis of right hip 06/10/2017   CAD S/P percutaneous coronary angioplasty 02/11/2015   Renal insufficiency 02/11/2015   Prolonged Q-T interval on  ECG 04/18/2010   Hypertension 08/11/2007   Hypothyroidism (acquired) 06/08/2007   MITRAL VALVE PROLAPSE 06/08/2007   Past Medical History:  Diagnosis Date   Arthritis    DDD, scoliosis, sees Dr. Mavis for this, uses norco very rarely for pain   Arthritis    lumbar stenosis, gets steroid injections for [facility]   Bradycardia 11/11/2017   CAD (coronary artery disease)    LAD stenting of a 90% lesion 2012   Cystocele    Educated about COVID-19 virus infection 12/06/2019   Elevated cholesterol    GERD (gastroesophageal reflux disease)    dx in work up 2016 for atypical CP at OSH   pt. denies   Heart murmur    Hypertensive retinopathy    OU   Hypothyroidism    Interstitial cystitis    sees Dr. Watt   Macular degeneration    OU   Neuromuscular disorder [facility])    neuropathy   NSTEMI (non-ST elevated myocardial infarction) (HCC)    Osteoporosis    Rectocele    S/P hip replacement, right 06/12/2017   Scoliosis    Thyroid  disease    Hypothyroid   Urinary incontinence    USI   Uterine prolapse     Past Surgical History:  Procedure Laterality Date   BLADDER SUSPENSION  2011   CATARACT EXTRACTION Bilateral 2015   Dr. Cleatus   CORONARY ANGIOPLASTY WITH STENT PLACEMENT  04/21/2010   LAD 80%, RCA 30%, nl EF, s/p DES LAD   EYE SURGERY     LEFT HEART CATH AND CORONARY ANGIOGRAPHY N/A 06/14/2017   Procedure: LEFT HEART CATH AND CORONARY ANGIOGRAPHY;  Surgeon: Court Dorn PARAS, MD;  Location: [facility];  Service: Cardiovascular;  Laterality: N/A;   OOPHORECTOMY  2011   BSO   TOTAL HIP ARTHROPLASTY Right 06/10/2017   Procedure: RIGHT TOTAL HIP ARTHROPLASTY ANTERIOR APPROACH;  Surgeon: Fidel Rogue, MD;  Location: [facility];  Service: Orthopedics;  Laterality: Right;  Needs RNFA   VAGINAL HYSTERECTOMY  2011   LAVH BSO; benign    Current Outpatient Medications  Medication Sig Dispense Refill Last Dose/Taking   Aflibercept  (EYLEA  IZ) by Intravitreal route See admin  instructions. Every 7 weeks      amLODipine  (NORVASC ) 2.5 MG tablet Take 1 tablet (2.5 mg total) by mouth daily. (Patient taking differently: Take 2.5 mg by mouth daily with lunch.) 90 tablet 3    aspirin  EC 81 MG tablet Take 81 mg by mouth at bedtime.      Biotin w/ Vitamins C & E (HAIR/SKIN/NAILS PO) Take 1 tablet by mouth daily with lunch.      Brimonidine Tartrate (LUMIFY) 0.025 % SOLN Place 1 drop into both eyes every 4 (four) hours as needed (irritated eyes).      Cranberry 400 MG CAPS Take 400 mg by mouth in the morning.      hydrALAZINE  (APRESOLINE ) 10 MG tablet TAKE (1) TABLET TWICE A DAY. MAY TAKE EXTRA DOSE FOR SBP >160 UP TO A TOTAL OF 4 TIMES A DAY AS NEEDED (Patient taking differently: Take 10 mg by mouth 4 (four) times daily as needed (SBP >160).) 360 tablet 3    isosorbide  mononitrate (IMDUR ) 60 MG 24 hr tablet TAKE 1 & 1/2 TABLETS by mouth once A DAY. (Patient taking differently: Take 90 mg by mouth at bedtime.) 135 tablet 1    Lidocaine -Menthol  3.5-7 % PTCH Place 1 patch onto the skin 2 (two) times daily as needed (knee/hip pain.).      Multiple Vitamins-Calcium  (ONE-A-DAY WOMENS PO) Take 1 tablet by mouth daily with lunch.      nitroGLYCERIN  (NITROSTAT ) 0.4 MG SL tablet DISSOLVE 1 TABLET UNDER TONGUE IF NEEDED FOR CHEST PAIN. MAY REPEAT IN 5 MINUTES FOR 3 DOSES. 25 tablet 11    NON FORMULARY Get steroid inject for SI joint every 12 wks with Dr. Darlis Leash surgery spine      ondansetron  (ZOFRAN -ODT) 4 MG disintegrating tablet dissolve 1 tablet (4 mg total) by mouth every 8 (eight) hours as needed for nausea or vomiting. 30 tablet 1    pantoprazole  (PROTONIX ) 40 MG tablet Take 1 tablet twice a day for 30 days, then decrease to 1 tablet daily. (Patient taking differently: Take 40 mg by mouth daily.) 60 tablet 2    Polyethyl Glycol-Propyl Glycol (SYSTANE) 0.4-0.3 % GEL ophthalmic gel Place 1 Application into both eyes every 4 (four) hours as needed (dry eyes).      rosuvastatin   (CRESTOR ) 10 MG tablet TABLET ONE-HALF TABLET BY MOUTH DAILY (Patient taking differently: Take 5 mg by mouth daily with lunch.) 45 tablet 3    SYNTHROID  50 MCG tablet TAKE 1 TABLET EACH DAY. NEED APPOINTMENT FOR ADDITIONAL REFILLS 90 tablet 0    No current facility-administered medications for this visit.   Allergies  Allergen Reactions   Acyclovir And Related Other (See Comments)    unknown   Ciprofloxacin  Other (See Comments)    dizziness   Pravachol  [Pravastatin  Sodium] Other (See Comments)    cystitis   Sulfa  Antibiotics     nausea   Zocor  [Simvastatin ] Other (See Comments)    cystitis    Social History   Tobacco Use   Smoking status: Never  Smokeless tobacco: Never  Substance Use Topics   Alcohol  use: No    Comment: Rare    Family History  Problem Relation Age of Onset   Hypertension Mother    Heart disease Mother    Heart disease Father    Heart disease Sister    Diabetes Sister    Uterine cancer Sister        mets to lungs   Lung cancer Sister    Scoliosis Sister    Heart disease Brother    Colon cancer Paternal Aunt 67   Breast cancer Paternal Aunt        Age 53's   Breast cancer Paternal Aunt    Leukemia Paternal Aunt    Lung cancer Paternal Grandfather        smoker   Arthritis Daughter    Breast cancer Cousin        Maternal 1st cousins-Age 9's   Esophageal cancer Neg Hx    Pancreatic cancer Neg Hx    Stomach cancer Neg Hx      Review of Systems  Musculoskeletal:  Positive for arthralgias and gait problem.    Objective:  Physical Exam Constitutional:      Appearance: Normal appearance.  HENT:     Head: Normocephalic and atraumatic.     Nose: Nose normal.     Mouth/Throat:     Mouth: Mucous membranes are moist.     Pharynx: Oropharynx is clear.   Eyes:     Conjunctiva/sclera: Conjunctivae normal.    Cardiovascular:     Rate and Rhythm: Normal rate and regular rhythm.     Pulses: Normal pulses.     Heart sounds: Normal heart  sounds.  Pulmonary:     Effort: Pulmonary effort is normal.     Breath sounds: Normal breath sounds.  Abdominal:     General: Abdomen is flat.  Genitourinary:    Comments: deferred  Musculoskeletal:     Cervical back: Normal range of motion and neck supple.     Comments: Examination of the left hip reveals no skin wounds or lesions. No significant trochanteric tenderness to palpation. She has severely restricted range of motion of the hip. Pain with terminal flexion and rotation. Pain in the position of impingement. Positive Stinchfield.  Distally, there is no focal motor or sensory deficit. She has palpable pedal pulses.  She ambulates with an antalgic gait.   Skin:    General: Skin is warm and dry.     Capillary Refill: Capillary refill takes less than 2 seconds.   Neurological:     General: No focal deficit present.     Mental Status: She is alert and oriented to person, place, and time.   Psychiatric:        Mood and Affect: Mood normal.        Behavior: Behavior normal.        Thought Content: Thought content normal.        Judgment: Judgment normal.     Vital signs in last 24 hours: @VSRANGES @  Labs:   Estimated body mass index is 21.97 kg/m as calculated from the following:   Height as of 01/17/24: 5' 3 (1.6 m).   Weight as of 01/17/24: 56.2 kg.   Imaging Review Plain radiographs demonstrate severe degenerative joint disease of the left hip(s). The bone quality appears to be adequate for age and reported activity level.      Assessment/Plan:  End stage arthritis, left hip(s)  The  patient history, physical examination, clinical judgement of the provider and imaging studies are consistent with end stage degenerative joint disease of the left hip(s) and total hip arthroplasty is deemed medically necessary. The treatment options including medical management, injection therapy, arthroscopy and arthroplasty were discussed at length. The risks and benefits of  total hip arthroplasty were presented and reviewed. The risks due to aseptic loosening, infection, stiffness, dislocation/subluxation,  thromboembolic complications and other imponderables were discussed.  The patient acknowledged the explanation, agreed to proceed with the plan and consent was signed. Patient is being admitted for inpatient treatment for surgery, pain control, PT, OT, prophylactic antibiotics, VTE prophylaxis, progressive ambulation and ADL's and discharge planning.The patient is planning to be discharged home with HEP after an overnight stay.   Therapy Plans: HEP.  Disposition: Home with daughter Planned DVT Prophylaxis: aspirin  81mg  BID DME needed: walker.  PCP: Cleared.  Cardiology: Cleared. Moderate risk.  TXA: IV Allergies:  - Acyclovir - unknown. - Ciprofloxacin  - dizziness. - Simvastatin  - urinary issues.  - Pravachol  - cystitis. - Sulfa  antibiotics - nausea.  - Zocor  - cystitis.  Anesthesia Concerns: None.  BMI: 22.3 Last HgbA1c: not diabetic Other: - CAD, s/p PCI LAD 2012. A few catherizations since. Aspirin  81mg  baseline.  - Cystocele.  - Osteoporosis, recent bone density study. - Chronic pain syndrome in pain management at [facility], scoliosis.  - History right THA.  - Hydrocodone , zofran .  - Labs pending.     Patient's anticipated LOS is less than 2 midnights, meeting these requirements: - Younger than 31 - Lives within 1 hour of care - Has a competent adult at home to recover with post-op recover - NO history of  - Chronic pain requiring opiods  - Diabetes  - Coronary Artery Disease  - Heart failure  - Heart attack  - Stroke  - DVT/VTE  - Cardiac arrhythmia  - Respiratory Failure/COPD  - Renal failure  - Anemia  - Advanced Liver disease

## 2024-01-14 NOTE — H&P (Signed)
 TOTAL HIP ADMISSION H&P  Patient is admitted for left total hip arthroplasty.  Subjective:  Chief Complaint: left hip pain  HPI: Kaitlyn Parks, 89 y.o. female, has a history of pain and functional disability in the left hip(s) due to arthritis and patient has failed non-surgical conservative treatments for greater than 12 weeks to include NSAID's and/or analgesics, corticosteriod injections, flexibility and strengthening excercises, use of assistive devices, and activity modification.  Onset of symptoms was gradual starting 10 years ago with rapidlly worsening course since that time.The patient noted no past surgery on the left hip(s).  Patient currently rates pain in the left hip at 10 out of 10 with activity. Patient has night pain, worsening of pain with activity and weight bearing, trendelenberg gait, pain that interfers with activities of daily living, and pain with passive range of motion. Patient has evidence of subchondral cysts, subchondral sclerosis, periarticular osteophytes, and joint space narrowing by imaging studies. This condition presents safety issues increasing the risk of falls.  There is no current active infection.  Patient Active Problem List   Diagnosis Date Noted   Pancreatitis, acute 06/14/2022   Abdominal pain 06/13/2022   Acute pancreatitis 06/13/2022   Hyponatremia 06/13/2022   Gastritis 06/11/2022   Aortic atherosclerosis (HCC) 12/07/2019   Macular degeneration 01/11/2019   Coronary artery disease involving native coronary artery of native heart without angina pectoris 10/29/2018   Presence of intraocular lens 09/23/2018   Bradycardia 11/11/2017   Dyslipidemia 11/11/2017   CAD (coronary artery disease) 06/12/2017   GERD (gastroesophageal reflux disease) 06/12/2017   S/P hip replacement, right 06/12/2017   Osteoarthritis of right hip 06/10/2017   CAD S/P percutaneous coronary angioplasty 02/11/2015   Renal insufficiency 02/11/2015   Prolonged Q-T interval on  ECG 04/18/2010   Hypertension 08/11/2007   Hypothyroidism (acquired) 06/08/2007   MITRAL VALVE PROLAPSE 06/08/2007   Past Medical History:  Diagnosis Date   Arthritis    DDD, scoliosis, sees Dr. Mavis for this, uses norco very rarely for pain   Arthritis    lumbar stenosis, gets steroid injections for [facility]   Bradycardia 11/11/2017   CAD (coronary artery disease)    LAD stenting of a 90% lesion 2012   Cystocele    Educated about COVID-19 virus infection 12/06/2019   Elevated cholesterol    GERD (gastroesophageal reflux disease)    dx in work up 2016 for atypical CP at OSH   pt. denies   Heart murmur    Hypertensive retinopathy    OU   Hypothyroidism    Interstitial cystitis    sees Dr. Watt   Macular degeneration    OU   Neuromuscular disorder [facility])    neuropathy   NSTEMI (non-ST elevated myocardial infarction) (HCC)    Osteoporosis    Rectocele    S/P hip replacement, right 06/12/2017   Scoliosis    Thyroid  disease    Hypothyroid   Urinary incontinence    USI   Uterine prolapse     Past Surgical History:  Procedure Laterality Date   BLADDER SUSPENSION  2011   CATARACT EXTRACTION Bilateral 2015   Dr. Cleatus   CORONARY ANGIOPLASTY WITH STENT PLACEMENT  04/21/2010   LAD 80%, RCA 30%, nl EF, s/p DES LAD   EYE SURGERY     LEFT HEART CATH AND CORONARY ANGIOGRAPHY N/A 06/14/2017   Procedure: LEFT HEART CATH AND CORONARY ANGIOGRAPHY;  Surgeon: Court Dorn PARAS, MD;  Location: [facility]AS[facility];  Service: Cardiovascular;  Laterality: N/A;   OOPHORECTOMY  2011   BSO   TOTAL HIP ARTHROPLASTY Right 06/10/2017   Procedure: RIGHT TOTAL HIP ARTHROPLASTY ANTERIOR APPROACH;  Surgeon: Fidel Rogue, MD;  Location: [facility] ORS;  Service: Orthopedics;  Laterality: Right;  Needs RNFA   VAGINAL HYSTERECTOMY  2011   [facility] BSO; benign    Current Outpatient Medications  Medication Sig Dispense Refill Last Dose/Taking   Aflibercept  (EYLEA  IZ) by Intravitreal route See admin  instructions. Every 7 weeks      amLODipine  (NORVASC ) 2.5 MG tablet Take 1 tablet (2.5 mg total) by mouth daily. (Patient taking differently: Take 2.5 mg by mouth daily with lunch.) 90 tablet 3    aspirin  EC 81 MG tablet Take 81 mg by mouth at bedtime.      Biotin w/ Vitamins C & E (HAIR/SKIN/NAILS PO) Take 1 tablet by mouth daily with lunch.      Brimonidine Tartrate (LUMIFY) 0.025 % SOLN Place 1 drop into both eyes every 4 (four) hours as needed (irritated eyes).      Cranberry 400 MG CAPS Take 400 mg by mouth in the morning.      hydrALAZINE  (APRESOLINE ) 10 MG tablet TAKE (1) TABLET TWICE A DAY. MAY TAKE EXTRA DOSE FOR SBP >160 UP TO A TOTAL OF 4 TIMES A DAY AS NEEDED (Patient taking differently: Take 10 mg by mouth 4 (four) times daily as needed (SBP >160).) 360 tablet 3    isosorbide  mononitrate (IMDUR ) 60 MG 24 hr tablet TAKE 1 & 1/2 TABLETS by mouth once A DAY. (Patient taking differently: Take 90 mg by mouth at bedtime.) 135 tablet 1    Lidocaine -Menthol  3.5-7 % PTCH Place 1 patch onto the skin 2 (two) times daily as needed (knee/hip pain.).      Multiple Vitamins-Calcium  (ONE-A-DAY WOMENS PO) Take 1 tablet by mouth daily with lunch.      nitroGLYCERIN  (NITROSTAT ) 0.4 MG SL tablet DISSOLVE 1 TABLET UNDER TONGUE IF NEEDED FOR CHEST PAIN. MAY REPEAT IN 5 MINUTES FOR 3 DOSES. 25 tablet 11    NON FORMULARY Get steroid inject for SI joint every 12 wks with Dr. Darlis Leash surgery spine      ondansetron  (ZOFRAN -ODT) 4 MG disintegrating tablet dissolve 1 tablet (4 mg total) by mouth every 8 (eight) hours as needed for nausea or vomiting. 30 tablet 1    pantoprazole  (PROTONIX ) 40 MG tablet Take 1 tablet twice a day for 30 days, then decrease to 1 tablet daily. (Patient taking differently: Take 40 mg by mouth daily.) 60 tablet 2    Polyethyl Glycol-Propyl Glycol (SYSTANE) 0.4-0.3 % GEL ophthalmic gel Place 1 Application into both eyes every 4 (four) hours as needed (dry eyes).      rosuvastatin   (CRESTOR ) 10 MG tablet TABLET ONE-HALF TABLET BY MOUTH DAILY (Patient taking differently: Take 5 mg by mouth daily with lunch.) 45 tablet 3    SYNTHROID  50 MCG tablet TAKE 1 TABLET EACH DAY. NEED APPOINTMENT FOR ADDITIONAL REFILLS 90 tablet 0    No current facility-administered medications for this visit.   Allergies  Allergen Reactions   Acyclovir And Related Other (See Comments)    unknown   Ciprofloxacin  Other (See Comments)    dizziness   Pravachol  [Pravastatin  Sodium] Other (See Comments)    cystitis   Sulfa  Antibiotics     nausea   Zocor  [Simvastatin ] Other (See Comments)    cystitis    Social History   Tobacco Use   Smoking status: Never  Smokeless tobacco: Never  Substance Use Topics   Alcohol  use: No    Comment: Rare    Family History  Problem Relation Age of Onset   Hypertension Mother    Heart disease Mother    Heart disease Father    Heart disease Sister    Diabetes Sister    Uterine cancer Sister        mets to lungs   Lung cancer Sister    Scoliosis Sister    Heart disease Brother    Colon cancer Paternal Aunt 67   Breast cancer Paternal Aunt        Age 53's   Breast cancer Paternal Aunt    Leukemia Paternal Aunt    Lung cancer Paternal Grandfather        smoker   Arthritis Daughter    Breast cancer Cousin        Maternal 1st cousins-Age 9's   Esophageal cancer Neg Hx    Pancreatic cancer Neg Hx    Stomach cancer Neg Hx      Review of Systems  Musculoskeletal:  Positive for arthralgias and gait problem.    Objective:  Physical Exam Constitutional:      Appearance: Normal appearance.  HENT:     Head: Normocephalic and atraumatic.     Nose: Nose normal.     Mouth/Throat:     Mouth: Mucous membranes are moist.     Pharynx: Oropharynx is clear.   Eyes:     Conjunctiva/sclera: Conjunctivae normal.    Cardiovascular:     Rate and Rhythm: Normal rate and regular rhythm.     Pulses: Normal pulses.     Heart sounds: Normal heart  sounds.  Pulmonary:     Effort: Pulmonary effort is normal.     Breath sounds: Normal breath sounds.  Abdominal:     General: Abdomen is flat.  Genitourinary:    Comments: deferred  Musculoskeletal:     Cervical back: Normal range of motion and neck supple.     Comments: Examination of the left hip reveals no skin wounds or lesions. No significant trochanteric tenderness to palpation. She has severely restricted range of motion of the hip. Pain with terminal flexion and rotation. Pain in the position of impingement. Positive Stinchfield.  Distally, there is no focal motor or sensory deficit. She has palpable pedal pulses.  She ambulates with an antalgic gait.   Skin:    General: Skin is warm and dry.     Capillary Refill: Capillary refill takes less than 2 seconds.   Neurological:     General: No focal deficit present.     Mental Status: She is alert and oriented to person, place, and time.   Psychiatric:        Mood and Affect: Mood normal.        Behavior: Behavior normal.        Thought Content: Thought content normal.        Judgment: Judgment normal.     Vital signs in last 24 hours: @VSRANGES @  Labs:   Estimated body mass index is 21.97 kg/m as calculated from the following:   Height as of 01/17/24: 5' 3 (1.6 m).   Weight as of 01/17/24: 56.2 kg.   Imaging Review Plain radiographs demonstrate severe degenerative joint disease of the left hip(s). The bone quality appears to be adequate for age and reported activity level.      Assessment/Plan:  End stage arthritis, left hip(s)  The  patient history, physical examination, clinical judgement of the provider and imaging studies are consistent with end stage degenerative joint disease of the left hip(s) and total hip arthroplasty is deemed medically necessary. The treatment options including medical management, injection therapy, arthroscopy and arthroplasty were discussed at length. The risks and benefits of  total hip arthroplasty were presented and reviewed. The risks due to aseptic loosening, infection, stiffness, dislocation/subluxation,  thromboembolic complications and other imponderables were discussed.  The patient acknowledged the explanation, agreed to proceed with the plan and consent was signed. Patient is being admitted for inpatient treatment for surgery, pain control, PT, OT, prophylactic antibiotics, VTE prophylaxis, progressive ambulation and ADL's and discharge planning.The patient is planning to be discharged home with HEP after an overnight stay.   Therapy Plans: HEP.  Disposition: Home with daughter Planned DVT Prophylaxis: aspirin  81mg  BID DME needed: walker.  PCP: Cleared.  Cardiology: Cleared. Moderate risk.  TXA: IV Allergies:  - Acyclovir - unknown. - Ciprofloxacin  - dizziness. - Simvastatin  - urinary issues.  - Pravachol  - cystitis. - Sulfa  antibiotics - nausea.  - Zocor  - cystitis.  Anesthesia Concerns: None.  BMI: 22.3 Last HgbA1c: not diabetic Other: - CAD, s/p PCI LAD 2012. A few catherizations since. Aspirin  81mg  baseline.  - Cystocele.  - Osteoporosis, recent bone density study. - Chronic pain syndrome in pain management at [facility] neurosurgery, scoliosis.  - History right THA.  - Hydrocodone , zofran .  - Labs pending.     Patient's anticipated LOS is less than 2 midnights, meeting these requirements: - Younger than 31 - Lives within 1 hour of care - Has a competent adult at home to recover with post-op recover - NO history of  - Chronic pain requiring opiods  - Diabetes  - Coronary Artery Disease  - Heart failure  - Heart attack  - Stroke  - DVT/VTE  - Cardiac arrhythmia  - Respiratory Failure/COPD  - Renal failure  - Anemia  - Advanced Liver disease

## 2024-01-16 NOTE — Progress Notes (Signed)
 COVID Vaccine received:  []  No [x]  Yes Date of any COVID positive Test in last 90 days:  PCP - Osborn Blaze, MD  Cardiologist - Eilleen Grates, MD , Katlyn West, NP  cardiac clearance in 01-03-24 Epic note  Chest x-ray - 04-23-2022  1v  Epic EKG - 05-26-2023  Epic  Stress Test - Lexiscan   10-27-2021  Epic ECHO - 10-27-2021  Epic Cardiac Cath - Last 2018  LHC / CORS by Dr. Katheryne Pane for [place]    (Had DES x1 in 2011) CT Coronary Calcium  score:   Pacemaker / ICD device [x]  No []  Yes   Spinal Cord Stimulator:[x]  No []  Yes       History of Sleep Apnea? [x]  No []  Yes   CPAP used?- [x]  No []  Yes    Does the patient monitor blood sugar?   [x]  N/A   []  No []  Yes  Patient has: [x]  NO Hx DM   []  Pre-DM   []  DM1  []   DM2  Blood Thinner / Instructions:  none Aspirin  Instructions:  ASA 81 mg   continue if possible, if not ok'd to stop 5-7- days.  ERAS Protocol Ordered: []  No  [x]  Yes PRE-SURGERY [x]  ENSURE  []  G2   Patient is to be NPO after: 0700  Dental hx: []  Dentures:  []  N/A      []  Bridge or Partial:                   []  Loose or Damaged teeth:   Comments: Patient was given the 5 CHG shower / bath instructions for THA  surgery along with 2 bottles of the CHG soap. Patient will start this on:    01-23-2024          Activity level: Able to walk up 2 flights of stairs without becoming significantly short of breath or having chest pain?  []  No   []    Yes   Anesthesia review: CAD- multiple LHC- last in 2018 for [place]  (Had DES x 1 in 2011), HTN, GERD, macular degeneration, MVP,  scoliosis  Patient denies shortness of breath, fever, cough and chest pain at PAT appointment.  Patient verbalized understanding and agreement to the Pre-Surgical Instructions that were given to them at this PAT appointment. Patient was also educated of the need to review these PAT instructions again prior to her surgery.I reviewed the appropriate phone numbers to call if they have any and questions or concerns.

## 2024-01-16 NOTE — Patient Instructions (Signed)
 SURGICAL WAITING ROOM VISITATION Patients having surgery or a procedure may have no more than 2 support people in the waiting area - these visitors may rotate in the visitor waiting room.   If the patient needs to stay at the hospital during part of their recovery, the visitor guidelines for inpatient rooms apply.  PRE-OP VISITATION  Pre-op nurse will coordinate an appropriate time for 1 support person to accompany the patient in pre-op.  This support person may not rotate.  This visitor will be contacted when the time is appropriate for the visitor to come back in the pre-op area.  Please refer to the [facility] website for the visitor guidelines for Inpatients (after your surgery is over and you are in a regular room).  You are not required to quarantine at this time prior to your surgery. However, you must do this: Hand Hygiene often Do NOT share personal items Notify your provider if you are in close contact with someone who has COVID or you develop fever 100.4 or greater, new onset of sneezing, cough, sore throat, shortness of breath or body aches.  If you test positive for Covid or have been in contact with anyone that has tested positive in the last 10 days please notify you surgeon.    Your procedure is scheduled on:  THURSDAY   January 27, 2024  Report to [facility] Main Entrance: [address] [address] entrance where the [employer] is available.   Report to admitting at: 07:30    AM  Call this number if you have any questions or problems the morning of surgery [phone]  Do not eat food after Midnight the night prior to your surgery/procedure.  After Midnight you may have the following liquids until    07:00 AM DAY OF SURGERY  Clear Liquid Diet Water  Black Coffee (sugar ok, NO MILK/CREAM OR CREAMERS)  Tea (sugar ok, NO MILK/CREAM OR CREAMERS) regular and decaf                             Plain Jell-O  with no fruit (NO RED)                                           Fruit  ices (not with fruit pulp, NO RED)                                     Popsicles (NO RED)                                                                  Juice: NO CITRUS JUICES: only apple, WHITE grape, WHITE cranberry Sports drinks like Gatorade or Powerade (NO RED)                   The day of surgery:  Drink ONE (1) Pre-Surgery Clear Ensure at  07:00  AM the morning of surgery. Drink in one sitting. Do not sip.  This drink was given to you during your hospital pre-op appointment visit. Nothing else  to drink after completing the Pre-Surgery Clear Ensure : No candy, chewing gum or throat lozenges.    FOLLOW ANY ADDITIONAL PRE OP INSTRUCTIONS YOU RECEIVED FROM YOUR SURGEON'S OFFICE!!!   Oral Hygiene is also important to reduce your risk of infection.        Remember - BRUSH YOUR TEETH THE MORNING OF SURGERY WITH YOUR REGULAR TOOTHPASTE  Do NOT smoke after Midnight the night before surgery.  STOP TAKING all Vitamins, Herbs and supplements 1 week before your surgery.   Take ONLY these medicines the morning of surgery with A SIP OF WATER : Pantoprazole , synthroid , hydralazine , Amlodipine , and you may use your Eye drops if needed.                      You may not have any metal on your body including hair pins, jewelry, and body piercing  Do not wear make-up, lotions, powders, perfumes or deodorant  Do not wear nail polish including gel and S&S, artificial / acrylic nails, or any other type of covering on natural nails including finger and toenails. If you have artificial nails, gel coating, etc., that needs to be removed by a nail salon, Please have this removed prior to surgery. Not doing so may mean that your surgery could be cancelled or delayed if the Surgeon or anesthesia staff feels like they are unable to monitor you safely.   Do not shave 48 hours prior to surgery to avoid nicks in your skin which may contribute to postoperative infections.   Contacts, Hearing Aids, dentures  or bridgework may not be worn into surgery. DENTURES WILL BE REMOVED PRIOR TO SURGERY PLEASE DO NOT APPLY Poly grip OR ADHESIVES!!!  You may bring a small overnight bag with you on the day of surgery, only pack items that are not valuable. [facility] IS NOT RESPONSIBLE   FOR VALUABLES THAT ARE LOST OR STOLEN.   Do not bring your home medications to the hospital. The Pharmacy will dispense medications listed on your medication list to you during your admission in the Hospital.  Special Instructions: Bring a copy of your healthcare power of attorney and living will documents the day of surgery, if you wish to have them scanned into your [place] Medical Records- EPIC  Please read over the following fact sheets you were given: IF YOU HAVE QUESTIONS ABOUT YOUR PRE-OP INSTRUCTIONS, PLEASE CALL [phone].     Pre-operative 5 CHG Bath Instructions   You can play a key role in reducing the risk of infection after surgery. Your skin needs to be as free of germs as possible. You can reduce the number of germs on your skin by washing with CHG (chlorhexidine  gluconate) soap before surgery. CHG is an antiseptic soap that kills germs and continues to kill germs even after washing.   DO NOT use if you have an allergy to chlorhexidine /CHG or antibacterial soaps. If your skin becomes reddened or irritated, stop using the CHG and notify one of our RNs at [phone]  Please shower with the CHG soap starting 4 days before surgery using the following schedule: START SHOWERS ON   SUNDAY  January 23, 2024  Please keep in mind the following:  DO NOT shave, including legs and underarms, starting the day of your first shower.   You may shave your face at any point before/day of surgery.   Place clean sheets on your bed the day you start  using CHG soap. Use a clean washcloth (not used since being washed) for each shower. DO NOT sleep with pets once you start using the CHG.   CHG Shower Instructions:  If you choose to wash your hair and private area, wash first with your normal shampoo/soap.  After you use shampoo/soap, rinse your hair and body thoroughly to remove shampoo/soap residue.  Turn the water  OFF and apply about 3 tablespoons (45 ml) of CHG soap to a CLEAN washcloth.  Apply CHG soap ONLY FROM YOUR NECK DOWN TO YOUR TOES (washing for 3-5 minutes)  DO NOT use CHG soap on face, private areas, open wounds, or sores.  Pay special attention to the area where your surgery is being performed.  If you are having back surgery, having someone wash your back for you may be helpful.  Wait 2 minutes after CHG soap is applied, then you may rinse off the CHG soap.  Pat dry with a clean towel  Put on clean clothes/pajamas   If you choose to wear lotion, please use ONLY the CHG-compatible lotions on the back of this paper.     Additional instructions for the day of surgery: DO NOT APPLY any lotions, deodorants, cologne, or perfumes.   Put on clean/comfortable clothes.  Brush your teeth.  Ask your nurse before applying any prescription medications to the skin.      CHG Compatible Lotions   Aveeno Moisturizing lotion  Cetaphil Moisturizing Cream  Cetaphil Moisturizing Lotion  Clairol Herbal Essence Moisturizing Lotion, Dry Skin  Clairol Herbal Essence Moisturizing Lotion, Extra Dry Skin  Clairol Herbal Essence Moisturizing Lotion, Normal Skin  Curel Age Defying Therapeutic Moisturizing Lotion with Alpha Hydroxy  Curel Extreme Care Body Lotion  Curel Soothing Hands Moisturizing Hand Lotion  Curel Therapeutic Moisturizing Cream, Fragrance-Free  Curel Therapeutic Moisturizing Lotion, Fragrance-Free  Curel Therapeutic Moisturizing Lotion, Original Formula  Eucerin Daily Replenishing Lotion  Eucerin Dry Skin Therapy Plus  Alpha Hydroxy Crme  Eucerin Dry Skin Therapy Plus Alpha Hydroxy Lotion  Eucerin Original Crme  Eucerin Original Lotion  Eucerin Plus Crme Eucerin Plus Lotion  Eucerin TriLipid Replenishing Lotion  Keri Anti-Bacterial Hand Lotion  Keri Deep Conditioning Original Lotion Dry Skin Formula Softly Scented  Keri Deep Conditioning Original Lotion, Fragrance Free Sensitive Skin Formula  Keri Lotion Fast Absorbing Fragrance Free Sensitive Skin Formula  Keri Lotion Fast Absorbing Softly Scented Dry Skin Formula  Keri Original Lotion  Keri Skin Renewal Lotion Keri Silky Smooth Lotion  Keri Silky Smooth Sensitive Skin Lotion  Nivea Body Creamy Conditioning Oil  Nivea Body Extra Enriched Lotion  Nivea Body Original Lotion  Nivea Body Sheer Moisturizing Lotion Nivea Crme  Nivea Skin Firming Lotion  NutraDerm 30 Skin Lotion  NutraDerm Skin Lotion  NutraDerm Therapeutic Skin Cream  NutraDerm Therapeutic Skin Lotion  ProShield Protective Hand Cream  Provon moisturizing lotion   FAILURE TO FOLLOW THESE INSTRUCTIONS MAY RESULT IN THE CANCELLATION OF YOUR SURGERY  PATIENT SIGNATURE_________________________________  NURSE SIGNATURE__________________________________  ________________________________________________________________________       Kaitlyn Parks    An incentive spirometer is a tool that can help keep your lungs clear and active. This tool measures how well you are filling your lungs with each breath. Taking  long deep breaths may help reverse or decrease the chance of developing breathing (pulmonary) problems (especially infection) following: A long period of time when you are unable to move or be active. BEFORE THE PROCEDURE  If the spirometer includes an indicator to show your best effort, your nurse or respiratory therapist will set it to a desired goal. If possible, sit up straight or lean slightly forward. Try not to slouch. Hold the incentive spirometer in an  upright position. INSTRUCTIONS FOR USE  Sit on the edge of your bed if possible, or sit up as far as you can in bed or on a chair. Hold the incentive spirometer in an upright position. Breathe out normally. Place the mouthpiece in your mouth and seal your lips tightly around it. Breathe in slowly and as deeply as possible, raising the piston or the ball toward the top of the column. Hold your breath for 3-5 seconds or for as long as possible. Allow the piston or ball to fall to the bottom of the column. Remove the mouthpiece from your mouth and breathe out normally. Rest for a few seconds and repeat Steps 1 through 7 at least 10 times every 1-2 hours when you are awake. Take your time and take a few normal breaths between deep breaths. The spirometer may include an indicator to show your best effort. Use the indicator as a goal to work toward during each repetition. After each set of 10 deep breaths, practice coughing to be sure your lungs are clear. If you have an incision (the cut made at the time of surgery), support your incision when coughing by placing a pillow or rolled up towels firmly against it. Once you are able to get out of bed, walk around indoors and cough well. You may stop using the incentive spirometer when instructed by your caregiver.  RISKS AND COMPLICATIONS Take your time so you do not get dizzy or light-headed. If you are in pain, you may need to take or ask for pain medication before doing incentive spirometry. It is harder to take a deep breath if you are having pain. AFTER USE Rest and breathe slowly and easily. It can be helpful to keep track of a log of your progress. Your caregiver can provide you with a simple table to help with this. If you are using the spirometer at home, follow these instructions: SEEK MEDICAL CARE IF:  You are having difficultly using the spirometer. You have trouble using the spirometer as often as instructed. Your pain medication is not  giving enough relief while using the spirometer. You develop fever of 100.5 F (38.1 C) or higher.                                                                                                    SEEK IMMEDIATE MEDICAL CARE IF:  You cough up bloody sputum that had not been present before. You develop fever of 102 F (38.9 C) or greater. You develop worsening pain at or near the incision site. MAKE SURE YOU:  Understand these instructions. Will watch your condition.  Will get help right away if you are not doing well or get worse. Document Released: 11/30/2006 Document Revised: 10/12/2011 Document Reviewed: 01/31/2007 [facility] Patient Information 2014 [place], [place].        WHAT IS A BLOOD TRANSFUSION? Blood Transfusion Information  A transfusion is the replacement of blood or some of its parts. Blood is made up of multiple cells which provide different functions. Red blood cells carry oxygen and are used for blood loss replacement. White blood cells fight against infection. Platelets control bleeding. Plasma helps clot blood. Other blood products are available for specialized needs, such as hemophilia or other clotting disorders. BEFORE THE TRANSFUSION  Who gives blood for transfusions?  Healthy volunteers who are fully evaluated to make sure their blood is safe. This is blood bank blood. Transfusion therapy is the safest it has ever been in the practice of medicine. Before blood is taken from a donor, a complete history is taken to make sure that person has no history of diseases nor engages in risky social behavior (examples are intravenous drug use or sexual activity with multiple partners). The donor's travel history is screened to minimize risk of transmitting infections, such as malaria. The donated blood is tested for signs of infectious diseases, such as HIV and hepatitis. The blood is then tested to be sure it is compatible with you in order to minimize the chance of a  transfusion reaction. If you or a relative donates blood, this is often done in anticipation of surgery and is not appropriate for emergency situations. It takes many days to process the donated blood. RISKS AND COMPLICATIONS Although transfusion therapy is very safe and saves many lives, the main dangers of transfusion include:  Getting an infectious disease. Developing a transfusion reaction. This is an allergic reaction to something in the blood you were given. Every precaution is taken to prevent this. The decision to have a blood transfusion has been considered carefully by your caregiver before blood is given. Blood is not given unless the benefits outweigh the risks. AFTER THE TRANSFUSION Right after receiving a blood transfusion, you will usually feel much better and more energetic. This is especially true if your red blood cells have gotten low (anemic). The transfusion raises the level of the red blood cells which carry oxygen, and this usually causes an energy increase. The nurse administering the transfusion will monitor you carefully for complications. HOME CARE INSTRUCTIONS  No special instructions are needed after a transfusion. You may find your energy is better. Speak with your caregiver about any limitations on activity for underlying diseases you may have. SEEK MEDICAL CARE IF:  Your condition is not improving after your transfusion. You develop redness or irritation at the intravenous (IV) site. SEEK IMMEDIATE MEDICAL CARE IF:  Any of the following symptoms occur over the next 12 hours: Shaking chills. You have a temperature by mouth above 102 F (38.9 C), not controlled by medicine. Chest, back, or muscle pain. People around you feel you are not acting correctly or are confused. Shortness of breath or difficulty breathing. Dizziness and fainting. You get a rash or develop hives. You have a decrease in urine output. Your urine turns a dark color or changes to pink, red,  or brown. Any of the following symptoms occur over the next 10 days: You have a temperature by mouth above 102 F (38.9 C), not controlled by medicine. Shortness of breath. Weakness after normal activity. The white part of the eye turns yellow (jaundice). You  have a decrease in the amount of urine or are urinating less often. Your urine turns a dark color or changes to pink, red, or brown. Document Released: 07/17/2000 Document Revised: 10/12/2011 Document Reviewed: 03/05/2008 [facility] Patient Information 2014 [place], [place].  _______________________________________________________________________         If you would like to see a video about joint replacement:   [email or web address]

## 2024-01-17 ENCOUNTER — Other Ambulatory Visit: Payer: Self-pay

## 2024-01-17 ENCOUNTER — Encounter (HOSPITAL_COMMUNITY)
Admission: RE | Admit: 2024-01-17 | Discharge: 2024-01-17 | Disposition: A | Discharge status: Home / Self Care | Source: Ambulatory Visit | Attending: Orthopedic Surgery | Admitting: Orthopedic Surgery

## 2024-01-17 ENCOUNTER — Encounter (HOSPITAL_COMMUNITY): Payer: Self-pay

## 2024-01-17 VITALS — BP 136/58 | HR 48 | Temp 98.0°F | Resp 14 | Ht 63.0 in | Wt 124.0 lb

## 2024-01-17 DIAGNOSIS — I2510 Atherosclerotic heart disease of native coronary artery without angina pectoris: Secondary | ICD-10-CM | POA: Insufficient documentation

## 2024-01-17 DIAGNOSIS — M1612 Unilateral primary osteoarthritis, left hip: Secondary | ICD-10-CM | POA: Insufficient documentation

## 2024-01-17 DIAGNOSIS — Z9861 Coronary angioplasty status: Secondary | ICD-10-CM | POA: Diagnosis not present

## 2024-01-17 DIAGNOSIS — Z01818 Encounter for other preprocedural examination: Secondary | ICD-10-CM | POA: Insufficient documentation

## 2024-01-17 HISTORY — DX: Myoneural disorder, unspecified: G70.9

## 2024-01-17 LAB — SURGICAL PCR SCREEN
MRSA, PCR: NEGATIVE
Staphylococcus aureus: NEGATIVE

## 2024-01-18 NOTE — Progress Notes (Signed)
 Anesthesia Chart Review   Case: [id number] Date/Time: 01/27/24 0945   Procedure: ARTHROPLASTY, HIP, TOTAL, ANTERIOR APPROACH (Left: Hip)   Anesthesia type: Spinal   Diagnosis: Primary osteoarthritis of left hip [M16.12]   Pre-op diagnosis: Left hip osteoarthritis   Location: [facility] ROOM 09 / [facility]   Surgeons: Adonica Hoose, MD       DISCUSSION:89 y.o. never smoker with h/o hypothyroidism, CAD s/p PCI of LAD in 2012, left hip OA scheduled for above procedure 01/27/2024 with Dr. Adonica Hoose.   Per cardiology preoperative evaluation 01/03/2024, Ms. Gravett's perioperative risk of a major cardiac event is 0.9% according to the Revised Cardiac Risk Index (RCRI).  Therefore, she is at low risk for perioperative complications.   Her functional capacity is good at 7.71 METs according to the Duke Activity Status Index (DASI). Recommendations: According to ACC/AHA guidelines, no further cardiovascular testing needed.  The patient may proceed to surgery at acceptable risk.   Antiplatelet and/or Anticoagulation Recommendations: Regarding ASA therapy, we recommend continuation of ASA throughout the perioperative period. However, if the surgeon feels that cessation of ASA is required in the perioperative period, it may be stopped 5-7 days prior to surgery with a plan to resume it as soon as felt to be feasible from a surgical standpoint in the post-operative period.  CBC and BMP were not drawn at PAT visit, ordered for DOS.  VS: BP (!) 136/58 Comment: right arm sitting  Pulse (!) 48 Comment: typical 50-58  Temp 36.7 C (Oral)   Resp 14   Ht 5' 3 (1.6 m)   Wt 56.2 kg   SpO2 97%   BMI 21.97 kg/m   PROVIDERS: Aida House, MD is PCP   Cardiologist - Eilleen Grates, MD   LABS: labs DOS  (all labs ordered are listed, but only abnormal results are displayed)  Labs Reviewed  SURGICAL PCR SCREEN  TYPE AND SCREEN     IMAGES:   EKG:   CV: Myocardial Perfusion 10/27/2021   ECG is  abnormal.  Baseline EKG showed junctional bradycardia.   No ST deviation was noted.   LV perfusion is normal. There is no evidence of ischemia. There is no evidence of infarction.   Left ventricular function is normal. Nuclear stress EF: 64 %. The left ventricular ejection fraction is normal (55-65%). End diastolic cavity size is normal. End systolic cavity size is normal.   Prior study available for comparison from 05/29/2020.   The study is normal. The study is low risk.  Echo 10/27/2021 1. Left ventricular ejection fraction, by estimation, is 60 to 65%. Left  ventricular ejection fraction by 3D volume is 61 %. The left ventricle has  normal function. The left ventricle has no regional wall motion  abnormalities. Left ventricular diastolic   parameters are consistent with Grade I diastolic dysfunction (impaired  relaxation).   2. Right ventricular systolic function is normal. The right ventricular  size is normal. There is normal pulmonary artery systolic pressure. The  estimated right ventricular systolic pressure is 19.5 mmHg.   3. The mitral valve is normal in structure. Mild to moderate mitral valve  regurgitation.   4. The aortic valve is tricuspid. There is mild calcification of the  aortic valve. There is mild thickening of the aortic valve. Aortic valve  regurgitation is mild. Aortic valve sclerosis/calcification is present,  without any evidence of aortic  stenosis.   5. The inferior vena cava is normal in size with greater than 50%  respiratory  variability, suggesting right atrial pressure of 3 mmHg.  Past Medical History:  Diagnosis Date   Arthritis    DDD, scoliosis, sees Dr. Larrie Po for this, uses norco very rarely for pain   Arthritis    lumbar stenosis, gets steroid injections for [facility]   Bradycardia 11/11/2017   CAD (coronary artery disease)    LAD stenting of a 90% lesion 2012   Cystocele    Educated about COVID-19 virus infection 12/06/2019   Elevated  cholesterol    GERD (gastroesophageal reflux disease)    dx in work up 2016 for atypical CP at OSH   pt. denies   Heart murmur    Hypertensive retinopathy    OU   Hypothyroidism    Interstitial cystitis    sees Dr. Inga Manges   Macular degeneration    OU   Neuromuscular disorder [facility])    neuropathy   NSTEMI (non-ST elevated myocardial infarction) (HCC)    Osteoporosis    Rectocele    S/P hip replacement, right 06/12/2017   Scoliosis    Thyroid  disease    Hypothyroid   Urinary incontinence    USI   Uterine prolapse     Past Surgical History:  Procedure Laterality Date   BLADDER SUSPENSION  2011   CATARACT EXTRACTION Bilateral 2015   Dr. Lasandra Points   CORONARY ANGIOPLASTY WITH STENT PLACEMENT  04/21/2010   LAD 80%, RCA 30%, nl EF, s/p DES LAD   EYE SURGERY     LEFT HEART CATH AND CORONARY ANGIOGRAPHY N/A 06/14/2017   Procedure: LEFT HEART CATH AND CORONARY ANGIOGRAPHY;  Surgeon: Avanell Leigh, MD;  Location: [facility];  Service: Cardiovascular;  Laterality: N/A;   OOPHORECTOMY  2011   BSO   TOTAL HIP ARTHROPLASTY Right 06/10/2017   Procedure: RIGHT TOTAL HIP ARTHROPLASTY ANTERIOR APPROACH;  Surgeon: Adonica Hoose, MD;  Location: [facility];  Service: Orthopedics;  Laterality: Right;  Needs RNFA   VAGINAL HYSTERECTOMY  2011   LAVH BSO; benign    MEDICATIONS:  Aflibercept  (EYLEA  IZ)   amLODipine  (NORVASC ) 2.5 MG tablet   aspirin  EC 81 MG tablet   Biotin w/ Vitamins C & E (HAIR/SKIN/NAILS PO)   Brimonidine Tartrate (LUMIFY) 0.025 % SOLN   Cranberry 400 MG CAPS   hydrALAZINE  (APRESOLINE ) 10 MG tablet   isosorbide  mononitrate (IMDUR ) 60 MG 24 hr tablet   Lidocaine -Menthol  3.5-7 % PTCH   Multiple Vitamins-Calcium  (ONE-A-DAY WOMENS PO)   nitroGLYCERIN  (NITROSTAT ) 0.4 MG SL tablet   NON FORMULARY   ondansetron  (ZOFRAN -ODT) 4 MG disintegrating tablet   pantoprazole  (PROTONIX ) 40 MG tablet   Polyethyl Glycol-Propyl Glycol (SYSTANE) 0.4-0.3 % GEL ophthalmic gel    rosuvastatin  (CRESTOR ) 10 MG tablet   SYNTHROID  50 MCG tablet   No current facility-administered medications for this encounter.    Chick Cotton Ward, PA-C [facility] [phone]

## 2024-01-18 NOTE — Progress Notes (Signed)
 Patient came in yesterday for her PST visit. We had a fill-in lab tech and she inadvertently missed drawing this patient's CBC and BMP. These will need to be drawn DOS, and I have placed orders in Epic.

## 2024-01-19 ENCOUNTER — Other Ambulatory Visit: Payer: Self-pay | Admitting: Family Medicine

## 2024-01-19 DIAGNOSIS — E039 Hypothyroidism, unspecified: Secondary | ICD-10-CM

## 2024-01-24 ENCOUNTER — Other Ambulatory Visit: Payer: Self-pay | Admitting: Family Medicine

## 2024-01-24 DIAGNOSIS — K8500 Idiopathic acute pancreatitis without necrosis or infection: Secondary | ICD-10-CM

## 2024-01-27 ENCOUNTER — Other Ambulatory Visit: Payer: Self-pay

## 2024-01-27 ENCOUNTER — Ambulatory Visit (HOSPITAL_COMMUNITY): Admitting: Registered Nurse

## 2024-01-27 ENCOUNTER — Ambulatory Visit (HOSPITAL_COMMUNITY)

## 2024-01-27 ENCOUNTER — Encounter (HOSPITAL_COMMUNITY)
Admission: RE | Disposition: A | Discharge status: Home / Self Care | Payer: Self-pay | Source: Home / Self Care | Attending: Orthopedic Surgery

## 2024-01-27 ENCOUNTER — Ambulatory Visit (HOSPITAL_COMMUNITY)
Admission: RE | Admit: 2024-01-27 | Discharge: 2024-01-29 | Disposition: A | Discharge status: Home / Self Care | Attending: Orthopedic Surgery | Admitting: Orthopedic Surgery

## 2024-01-27 ENCOUNTER — Encounter (HOSPITAL_COMMUNITY): Payer: Self-pay | Admitting: Orthopedic Surgery

## 2024-01-27 ENCOUNTER — Ambulatory Visit (HOSPITAL_COMMUNITY): Payer: Self-pay | Admitting: Physician Assistant

## 2024-01-27 DIAGNOSIS — I252 Old myocardial infarction: Secondary | ICD-10-CM | POA: Diagnosis not present

## 2024-01-27 DIAGNOSIS — E039 Hypothyroidism, unspecified: Secondary | ICD-10-CM | POA: Insufficient documentation

## 2024-01-27 DIAGNOSIS — Z96642 Presence of left artificial hip joint: Secondary | ICD-10-CM | POA: Diagnosis present

## 2024-01-27 DIAGNOSIS — M1612 Unilateral primary osteoarthritis, left hip: Secondary | ICD-10-CM

## 2024-01-27 DIAGNOSIS — I10 Essential (primary) hypertension: Secondary | ICD-10-CM

## 2024-01-27 DIAGNOSIS — Z79890 Hormone replacement therapy (postmenopausal): Secondary | ICD-10-CM | POA: Insufficient documentation

## 2024-01-27 DIAGNOSIS — Z01818 Encounter for other preprocedural examination: Secondary | ICD-10-CM

## 2024-01-27 DIAGNOSIS — I2510 Atherosclerotic heart disease of native coronary artery without angina pectoris: Secondary | ICD-10-CM | POA: Diagnosis not present

## 2024-01-27 HISTORY — PX: TOTAL HIP ARTHROPLASTY: SHX124

## 2024-01-27 LAB — BASIC METABOLIC PANEL WITH GFR
Anion gap: 9 (ref 5–15)
BUN: 21 mg/dL (ref 8–23)
CO2: 24 mmol/L (ref 22–32)
Calcium: 9 mg/dL (ref 8.9–10.3)
Chloride: 104 mmol/L (ref 98–111)
Creatinine, Ser: 0.95 mg/dL (ref 0.44–1.00)
GFR, Estimated: 58 mL/min — ABNORMAL LOW (ref >60)
Glucose, Bld: 81 mg/dL (ref 70–99)
Potassium: 4.2 mmol/L (ref 3.5–5.1)
Sodium: 137 mmol/L (ref 135–145)

## 2024-01-27 LAB — CBC
HCT: 38.9 % (ref 36.0–46.0)
Hemoglobin: 12.6 g/dL (ref 12.0–15.0)
MCH: 31.1 pg (ref 26.0–34.0)
MCHC: 32.4 g/dL (ref 30.0–36.0)
MCV: 96 fL (ref 80.0–100.0)
Platelets: 219 10*3/uL (ref 150–400)
RBC: 4.05 MIL/uL (ref 3.87–5.11)
RDW: 12.3 % (ref 11.5–15.5)
WBC: 4.5 10*3/uL (ref 4.0–10.5)
nRBC: 0 % (ref 0.0–0.2)

## 2024-01-27 LAB — TYPE AND SCREEN
ABO/RH(D): AB POS
Antibody Screen: NEGATIVE

## 2024-01-27 SURGERY — ARTHROPLASTY, HIP, TOTAL, ANTERIOR APPROACH
Anesthesia: Spinal | Site: Hip | Laterality: Left

## 2024-01-27 MED ORDER — METOCLOPRAMIDE HCL 5 MG/ML IJ SOLN
5.0000 mg | Freq: Three times a day (TID) | INTRAMUSCULAR | Status: DC | PRN
  Administered 2024-01-28 – 2024-01-29 (×3): 5 mg via INTRAVENOUS
  Filled 2024-01-27 (×3): qty 2

## 2024-01-27 MED ORDER — FENTANYL CITRATE PF 50 MCG/ML IJ SOSY: 25.0000 ug | PREFILLED_SYRINGE | INTRAMUSCULAR | Status: DC | PRN

## 2024-01-27 MED ORDER — ISOPROPYL ALCOHOL 70 % SOLN
Status: DC | PRN
  Administered 2024-01-27: 1 via TOPICAL

## 2024-01-27 MED ORDER — METHOCARBAMOL 1000 MG/10ML IJ SOLN
500.0000 mg | Freq: Four times a day (QID) | INTRAMUSCULAR | Status: DC | PRN
Start: 2024-01-27 — End: 2024-01-29

## 2024-01-27 MED ORDER — SODIUM CHLORIDE (PF) 0.9 % IJ SOLN
INTRAMUSCULAR | Status: DC | PRN
  Administered 2024-01-27: 61 mL

## 2024-01-27 MED ORDER — SODIUM CHLORIDE (PF) 0.9 % IJ SOLN
INTRAMUSCULAR | Status: AC
  Filled 2024-01-27: qty 30

## 2024-01-27 MED ORDER — BRIMONIDINE TARTRATE 0.2 % OP SOLN: 1.0000 [drp] | OPHTHALMIC | Status: DC | PRN

## 2024-01-27 MED ORDER — BUPIVACAINE IN DEXTROSE 0.75-8.25 % IT SOLN
INTRATHECAL | Status: DC | PRN
  Administered 2024-01-27: 1.7 mL via INTRATHECAL

## 2024-01-27 MED ORDER — TRANEXAMIC ACID-NACL 1000-0.7 MG/100ML-% IV SOLN
1000.0000 mg | INTRAVENOUS | Status: AC
  Administered 2024-01-27: 1000 mg via INTRAVENOUS
  Filled 2024-01-27: qty 100

## 2024-01-27 MED ORDER — PROPOFOL 10 MG/ML IV BOLUS
INTRAVENOUS | Status: DC | PRN
  Administered 2024-01-27: 30 mg via INTRAVENOUS
  Administered 2024-01-27 (×2): 20 mg via INTRAVENOUS

## 2024-01-27 MED ORDER — PHENYLEPHRINE HCL (PRESSORS) 10 MG/ML IV SOLN
INTRAVENOUS | Status: DC | PRN
  Administered 2024-01-27: 80 ug via INTRAVENOUS

## 2024-01-27 MED ORDER — METHOCARBAMOL 500 MG PO TABS
500.0000 mg | ORAL_TABLET | Freq: Four times a day (QID) | ORAL | Status: DC | PRN
  Administered 2024-01-27 – 2024-01-28 (×4): 500 mg via ORAL
  Filled 2024-01-27 (×3): qty 1

## 2024-01-27 MED ORDER — PHENYLEPHRINE HCL-NACL 20-0.9 MG/250ML-% IV SOLN
INTRAVENOUS | Status: DC | PRN
  Administered 2024-01-27: 30 ug/min via INTRAVENOUS

## 2024-01-27 MED ORDER — FENTANYL CITRATE (PF) 100 MCG/2ML IJ SOLN
INTRAMUSCULAR | Status: AC
  Filled 2024-01-27: qty 2

## 2024-01-27 MED ORDER — LACTATED RINGERS IV SOLN: INTRAVENOUS | Status: DC

## 2024-01-27 MED ORDER — NITROGLYCERIN 0.4 MG SL SUBL: 0.4000 mg | SUBLINGUAL_TABLET | SUBLINGUAL | Status: DC | PRN

## 2024-01-27 MED ORDER — POLYVINYL ALCOHOL 1.4 % OP SOLN: 1.0000 [drp] | OPHTHALMIC | Status: DC | PRN

## 2024-01-27 MED ORDER — WATER FOR IRRIGATION, STERILE IR SOLN
Status: DC | PRN
  Administered 2024-01-27: 2000 mL

## 2024-01-27 MED ORDER — DIPHENHYDRAMINE HCL 12.5 MG/5ML PO ELIX
12.5000 mg | ORAL_SOLUTION | ORAL | Status: DC | PRN
  Administered 2024-01-28: 25 mg via ORAL
  Filled 2024-01-27: qty 10

## 2024-01-27 MED ORDER — LEVOTHYROXINE SODIUM 50 MCG PO TABS
50.0000 ug | ORAL_TABLET | Freq: Every day | ORAL | Status: DC
  Administered 2024-01-28 – 2024-01-29 (×2): 50 ug via ORAL
  Filled 2024-01-27 (×2): qty 1

## 2024-01-27 MED ORDER — ALUM & MAG HYDROXIDE-SIMETH 200-200-20 MG/5ML PO SUSP: 30.0000 mL | ORAL | Status: DC | PRN

## 2024-01-27 MED ORDER — CHLORHEXIDINE GLUCONATE 0.12 % MT SOLN
15.0000 mL | Freq: Once | OROMUCOSAL | Status: AC
  Administered 2024-01-27: 15 mL via OROMUCOSAL

## 2024-01-27 MED ORDER — FENTANYL CITRATE (PF) 100 MCG/2ML IJ SOLN
INTRAMUSCULAR | Status: DC | PRN
  Administered 2024-01-27: 25 ug via INTRAVENOUS
  Administered 2024-01-27: 50 ug via INTRAVENOUS
  Administered 2024-01-27: 25 ug via INTRAVENOUS

## 2024-01-27 MED ORDER — METOCLOPRAMIDE HCL 5 MG PO TABS: 5.0000 mg | ORAL_TABLET | Freq: Three times a day (TID) | ORAL | Status: DC | PRN

## 2024-01-27 MED ORDER — BUPIVACAINE-EPINEPHRINE (PF) 0.25% -1:200000 IJ SOLN
INTRAMUSCULAR | Status: AC
  Filled 2024-01-27: qty 30

## 2024-01-27 MED ORDER — KETOROLAC TROMETHAMINE 30 MG/ML IJ SOLN
INTRAMUSCULAR | Status: AC
  Filled 2024-01-27: qty 1

## 2024-01-27 MED ORDER — SENNA 8.6 MG PO TABS
1.0000 | ORAL_TABLET | Freq: Two times a day (BID) | ORAL | Status: DC
  Administered 2024-01-27 – 2024-01-29 (×4): 8.6 mg via ORAL
  Filled 2024-01-27 (×4): qty 1

## 2024-01-27 MED ORDER — ROSUVASTATIN CALCIUM 5 MG PO TABS
5.0000 mg | ORAL_TABLET | Freq: Every day | ORAL | Status: DC
  Administered 2024-01-28 – 2024-01-29 (×2): 5 mg via ORAL
  Filled 2024-01-27 (×2): qty 1

## 2024-01-27 MED ORDER — SODIUM CHLORIDE 0.9 % IR SOLN
Status: DC | PRN
  Administered 2024-01-27: 3000 mL
  Administered 2024-01-27: 250 mL

## 2024-01-27 MED ORDER — KETOROLAC TROMETHAMINE 15 MG/ML IJ SOLN
7.5000 mg | Freq: Four times a day (QID) | INTRAMUSCULAR | Status: AC
  Administered 2024-01-27 – 2024-01-28 (×4): 7.5 mg via INTRAVENOUS
  Filled 2024-01-27 (×4): qty 1

## 2024-01-27 MED ORDER — MENTHOL 3 MG MT LOZG: 1.0000 | LOZENGE | OROMUCOSAL | Status: DC | PRN

## 2024-01-27 MED ORDER — DEXAMETHASONE SODIUM PHOSPHATE 10 MG/ML IJ SOLN
INTRAMUSCULAR | Status: AC
  Filled 2024-01-27: qty 1

## 2024-01-27 MED ORDER — PANTOPRAZOLE SODIUM 40 MG PO TBEC
40.0000 mg | DELAYED_RELEASE_TABLET | Freq: Every day | ORAL | Status: DC
  Administered 2024-01-27 – 2024-01-29 (×3): 40 mg via ORAL
  Filled 2024-01-27 (×3): qty 1

## 2024-01-27 MED ORDER — OXYCODONE HCL 5 MG/5ML PO SOLN: 5.0000 mg | Freq: Once | ORAL | Status: AC | PRN

## 2024-01-27 MED ORDER — CEFAZOLIN SODIUM-DEXTROSE 2-4 GM/100ML-% IV SOLN
2.0000 g | Freq: Four times a day (QID) | INTRAVENOUS | Status: AC
  Administered 2024-01-27 – 2024-01-28 (×2): 2 g via INTRAVENOUS
  Filled 2024-01-27 (×2): qty 100

## 2024-01-27 MED ORDER — POVIDONE-IODINE 10 % EX SWAB: 2.0000 | Freq: Once | CUTANEOUS | Status: DC

## 2024-01-27 MED ORDER — METHOCARBAMOL 500 MG PO TABS
ORAL_TABLET | ORAL | Status: AC
  Filled 2024-01-27: qty 1

## 2024-01-27 MED ORDER — ORAL CARE MOUTH RINSE: 15.0000 mL | Freq: Once | OROMUCOSAL | Status: AC

## 2024-01-27 MED ORDER — ISOSORBIDE MONONITRATE ER 60 MG PO TB24
90.0000 mg | ORAL_TABLET | Freq: Every day | ORAL | Status: DC
  Administered 2024-01-27 – 2024-01-28 (×2): 90 mg via ORAL
  Filled 2024-01-27 (×2): qty 1

## 2024-01-27 MED ORDER — HYDROCODONE-ACETAMINOPHEN 7.5-325 MG PO TABS
1.0000 | ORAL_TABLET | ORAL | Status: DC | PRN
  Administered 2024-01-27 (×2): 1 via ORAL
  Administered 2024-01-28 (×2): 2 via ORAL
  Filled 2024-01-27 (×2): qty 2
  Filled 2024-01-27 (×2): qty 1

## 2024-01-27 MED ORDER — BISACODYL 10 MG RE SUPP
10.0000 mg | Freq: Every day | RECTAL | Status: DC | PRN
Start: 2024-01-27 — End: 2024-01-29

## 2024-01-27 MED ORDER — OXYCODONE HCL 5 MG PO TABS
5.0000 mg | ORAL_TABLET | Freq: Once | ORAL | Status: AC | PRN
  Administered 2024-01-27: 5 mg via ORAL

## 2024-01-27 MED ORDER — ASPIRIN 81 MG PO CHEW
81.0000 mg | CHEWABLE_TABLET | Freq: Two times a day (BID) | ORAL | Status: DC
  Administered 2024-01-27 – 2024-01-29 (×4): 81 mg via ORAL
  Filled 2024-01-27 (×4): qty 1

## 2024-01-27 MED ORDER — LIDOCAINE HCL (PF) 2 % IJ SOLN
INTRAMUSCULAR | Status: AC
Start: 2024-01-27 — End: 2024-01-27
  Filled 2024-01-27: qty 5

## 2024-01-27 MED ORDER — ACETAMINOPHEN 325 MG PO TABS: 325.0000 mg | ORAL_TABLET | Freq: Four times a day (QID) | ORAL | Status: DC | PRN

## 2024-01-27 MED ORDER — PROPOFOL 1000 MG/100ML IV EMUL
INTRAVENOUS | Status: AC
  Filled 2024-01-27: qty 100

## 2024-01-27 MED ORDER — DEXAMETHASONE SODIUM PHOSPHATE 10 MG/ML IJ SOLN
INTRAMUSCULAR | Status: DC | PRN
  Administered 2024-01-27: 10 mg via INTRAVENOUS

## 2024-01-27 MED ORDER — ACETAMINOPHEN 10 MG/ML IV SOLN: 1000.0000 mg | Freq: Once | INTRAVENOUS | Status: DC | PRN

## 2024-01-27 MED ORDER — PHENOL 1.4 % MT LIQD: 1.0000 | OROMUCOSAL | Status: DC | PRN

## 2024-01-27 MED ORDER — OXYCODONE HCL 5 MG PO TABS
ORAL_TABLET | ORAL | Status: AC
Start: 2024-01-27 — End: 2024-01-27
  Filled 2024-01-27: qty 1

## 2024-01-27 MED ORDER — EPHEDRINE SULFATE-NACL 50-0.9 MG/10ML-% IV SOSY
PREFILLED_SYRINGE | INTRAVENOUS | Status: DC | PRN
  Administered 2024-01-27: 5 mg via INTRAVENOUS

## 2024-01-27 MED ORDER — MORPHINE SULFATE (PF) 2 MG/ML IV SOLN
0.5000 mg | INTRAVENOUS | Status: DC | PRN
  Administered 2024-01-27: 1 mg via INTRAVENOUS
  Administered 2024-01-27: 0.5 mg via INTRAVENOUS
  Filled 2024-01-27 (×2): qty 1

## 2024-01-27 MED ORDER — DOCUSATE SODIUM 100 MG PO CAPS
100.0000 mg | ORAL_CAPSULE | Freq: Two times a day (BID) | ORAL | Status: DC
  Administered 2024-01-27 – 2024-01-29 (×4): 100 mg via ORAL
  Filled 2024-01-27 (×4): qty 1

## 2024-01-27 MED ORDER — CEFAZOLIN SODIUM-DEXTROSE 2-4 GM/100ML-% IV SOLN
2.0000 g | INTRAVENOUS | Status: AC
  Administered 2024-01-27: 2 g via INTRAVENOUS
  Filled 2024-01-27: qty 100

## 2024-01-27 MED ORDER — POVIDONE-IODINE 10 % EX SWAB
2.0000 | Freq: Once | CUTANEOUS | Status: AC
Start: 2024-01-27 — End: 2024-01-27
  Administered 2024-01-27: 2 via TOPICAL

## 2024-01-27 MED ORDER — ACETAMINOPHEN 500 MG PO TABS
1000.0000 mg | ORAL_TABLET | Freq: Once | ORAL | Status: AC
  Administered 2024-01-27: 1000 mg via ORAL
  Filled 2024-01-27: qty 2

## 2024-01-27 MED ORDER — PROPOFOL 500 MG/50ML IV EMUL
INTRAVENOUS | Status: DC | PRN
  Administered 2024-01-27: 50 ug/kg/min via INTRAVENOUS

## 2024-01-27 MED ORDER — SODIUM CHLORIDE 0.9 % IV SOLN: INTRAVENOUS | Status: DC

## 2024-01-27 MED ORDER — POLYETHYLENE GLYCOL 3350 17 G PO PACK: 17.0000 g | PACK | Freq: Every day | ORAL | Status: DC | PRN

## 2024-01-27 MED ORDER — LACTATED RINGERS IV SOLN: INTRAVENOUS | Status: DC | PRN

## 2024-01-27 MED ORDER — HYDROCODONE-ACETAMINOPHEN 5-325 MG PO TABS
1.0000 | ORAL_TABLET | ORAL | Status: DC | PRN
  Administered 2024-01-28 – 2024-01-29 (×6): 2 via ORAL
  Filled 2024-01-27 (×6): qty 2

## 2024-01-27 MED ORDER — EPHEDRINE 5 MG/ML INJ
INTRAVENOUS | Status: AC
  Filled 2024-01-27: qty 5

## 2024-01-27 SURGICAL SUPPLY — 48 items
BAG COUNTER SPONGE SURGICOUNT (BAG) IMPLANT
BAG ZIPLOCK 12X15 (MISCELLANEOUS) IMPLANT
BLADE SAW RECIPROCATING 77.5 (BLADE) ×1 IMPLANT
CHLORAPREP W/TINT 26 (MISCELLANEOUS) ×1 IMPLANT
COVER PERINEAL POST (MISCELLANEOUS) ×1 IMPLANT
COVER SURGICAL LIGHT HANDLE (MISCELLANEOUS) ×1 IMPLANT
DERMABOND ADVANCED .7 DNX12 (GAUZE/BANDAGES/DRESSINGS) ×2 IMPLANT
DRAPE IMP U-DRAPE 54X76 (DRAPES) ×1 IMPLANT
DRAPE SHEET LG 3/4 BI-LAMINATE (DRAPES) ×3 IMPLANT
DRAPE STERI IOBAN 125X83 (DRAPES) ×1 IMPLANT
DRAPE U-SHAPE 47X51 STRL (DRAPES) ×2 IMPLANT
DRSG AQUACEL AG ADV 3.5X10 (GAUZE/BANDAGES/DRESSINGS) ×1 IMPLANT
ELECT REM PT RETURN 15FT ADLT (MISCELLANEOUS) ×1 IMPLANT
GAUZE SPONGE 4X4 12PLY STRL (GAUZE/BANDAGES/DRESSINGS) ×1 IMPLANT
GLOVE BIO SURGEON STRL SZ7 (GLOVE) ×1 IMPLANT
GLOVE BIO SURGEON STRL SZ8.5 (GLOVE) ×2 IMPLANT
GLOVE BIOGEL PI IND STRL 7.5 (GLOVE) ×1 IMPLANT
GLOVE BIOGEL PI IND STRL 8.5 (GLOVE) ×1 IMPLANT
GOWN SPEC L3 XXLG W/TWL (GOWN DISPOSABLE) ×1 IMPLANT
GOWN STRL REUS W/ TWL XL LVL3 (GOWN DISPOSABLE) ×1 IMPLANT
HEAD FEM -3XOFST 36XMDLR (Head) IMPLANT
HOLDER FOLEY CATH W/STRAP (MISCELLANEOUS) ×1 IMPLANT
HOOD PEEL AWAY T7 (MISCELLANEOUS) ×3 IMPLANT
KIT TURNOVER KIT A (KITS) ×1 IMPLANT
LINER ACE G7 HIGH 36 SZ E (Liner) IMPLANT
MANIFOLD NEPTUNE II (INSTRUMENTS) ×1 IMPLANT
MARKER SKIN DUAL TIP RULER LAB (MISCELLANEOUS) ×1 IMPLANT
NDL SAFETY ECLIPSE 18X1.5 (NEEDLE) ×1 IMPLANT
NDL SPNL 18GX3.5 QUINCKE PK (NEEDLE) ×1 IMPLANT
NEEDLE SPNL 18GX3.5 QUINCKE PK (NEEDLE) ×1 IMPLANT
PACK ANTERIOR HIP CUSTOM (KITS) ×1 IMPLANT
PENCIL SMOKE EVACUATOR (MISCELLANEOUS) ×1 IMPLANT
SEALER BIPOLAR AQUA 6.0 (INSTRUMENTS) ×1 IMPLANT
SET HNDPC FAN SPRY TIP SCT (DISPOSABLE) ×1 IMPLANT
SHELL ACETAB 3H 52 E HIP (Shell) IMPLANT
SOLUTION PRONTOSAN WOUND 350ML (IRRIGATION / IRRIGATOR) ×1 IMPLANT
SPIKE FLUID TRANSFER (MISCELLANEOUS) ×1 IMPLANT
SUT MNCRL AB 3-0 PS2 18 (SUTURE) ×1 IMPLANT
SUT MON AB 2-0 CT1 36 (SUTURE) ×1 IMPLANT
SUT STRATAFIX 14 PDO 48 VLT (SUTURE) ×1 IMPLANT
SUT VIC AB 2-0 CT1 TAPERPNT 27 (SUTURE) IMPLANT
SYR 3ML LL SCALE MARK (SYRINGE) ×1 IMPLANT
TAPERLOC MCRO PRMY FMRL 16X117 (Joint) IMPLANT
TOWEL GREEN STERILE FF (TOWEL DISPOSABLE) ×1 IMPLANT
TRAY FOLEY MTR SLVR 14FR STAT (SET/KITS/TRAYS/PACK) IMPLANT
TRAY FOLEY MTR SLVR 16FR STAT (SET/KITS/TRAYS/PACK) IMPLANT
TUBE SUCTION HIGH CAP CLEAR NV (SUCTIONS) ×1 IMPLANT
WATER STERILE IRR 1000ML POUR (IV SOLUTION) ×1 IMPLANT

## 2024-01-27 NOTE — Transfer of Care (Signed)
 Immediate Anesthesia Transfer of Care Note  Patient: Kaitlyn Parks  Procedure(s) Performed: ARTHROPLASTY, HIP, TOTAL, ANTERIOR APPROACH (Left: Hip)  Patient Location: PACU  Anesthesia Type:Spinal  Level of Consciousness: awake, alert , and oriented  Airway & Oxygen Therapy: Patient Spontanous Breathing and Patient connected to face mask oxygen  Post-op Assessment: Report given to RN and Post -op Vital signs reviewed and stable  Post vital signs: Reviewed and stable  Last Vitals:  Vitals Value Taken Time  BP 109/54 01/27/24 13:16  Temp    Pulse 65 01/27/24 13:19  Resp 14 01/27/24 13:19  SpO2 99 % 01/27/24 13:19  Vitals shown include unfiled device data.  Last Pain:  Vitals:   01/27/24 0854  TempSrc: Oral  PainSc:          Complications: No notable events documented.

## 2024-01-27 NOTE — Anesthesia Postprocedure Evaluation (Signed)
 Anesthesia Post Note  Patient: Kaitlyn Parks  Procedure(s) Performed: ARTHROPLASTY, HIP, TOTAL, ANTERIOR APPROACH (Left: Hip)     Patient location during evaluation: PACU Anesthesia Type: Spinal Level of consciousness: awake and alert Pain management: pain level controlled Vital Signs Assessment: post-procedure vital signs reviewed and stable Respiratory status: spontaneous breathing, nonlabored ventilation, respiratory function stable and patient connected to nasal cannula oxygen Cardiovascular status: blood pressure returned to baseline and stable Postop Assessment: no apparent nausea or vomiting Anesthetic complications: no   No notable events documented.  Last Vitals:  Vitals:   01/27/24 1430 01/27/24 1529  BP: 138/66 (!) 158/61  Pulse:  66  Resp: 17 18  Temp:  (!) 36.3 C  SpO2: 100% 97%    Last Pain:  Vitals:   01/27/24 1529  TempSrc: Oral  PainSc:                  Lynwood MARLA Cornea

## 2024-01-27 NOTE — Interval H&P Note (Signed)
 History and Physical Interval Note:  01/27/2024 8:06 AM  Kaitlyn Parks Kaitlyn Parks  has presented today for surgery, with the diagnosis of Left hip osteoarthritis.  The various methods of treatment have been discussed with the patient and family. After consideration of risks, benefits and other options for treatment, the patient has consented to  Procedure(s): ARTHROPLASTY, HIP, TOTAL, ANTERIOR APPROACH (Left) as a surgical intervention.  The patient's history has been reviewed, patient examined, no change in status, stable for surgery.  I have reviewed the patient's chart and labs.  Questions were answered to the patient's satisfaction.     Redell PARAS Deshon Hsiao

## 2024-01-27 NOTE — Anesthesia Preprocedure Evaluation (Addendum)
 Anesthesia Evaluation  Patient identified by MRN, date of birth, ID band Patient awake    Reviewed: Allergy & Precautions, NPO status , Patient's Chart, lab work & pertinent test results, reviewed documented beta blocker date and time   History of Anesthesia Complications Negative for: history of anesthetic complications  Airway Mallampati: II  TM Distance: >3 FB     Dental no notable dental hx.    Pulmonary neg COPD   breath sounds clear to auscultation       Cardiovascular hypertension, + CAD, + Past MI and + Cardiac Stents  (-) CHF + Valvular Problems/Murmurs MR  Rhythm:Regular Rate:Normal  IMPRESSIONS     1. Left ventricular ejection fraction, by estimation, is 60 to 65%. Left  ventricular ejection fraction by 3D volume is 61 %. The left ventricle has  normal function. The left ventricle has no regional wall motion  abnormalities. Left ventricular diastolic   parameters are consistent with Grade I diastolic dysfunction (impaired  relaxation).   2. Right ventricular systolic function is normal. The right ventricular  size is normal. There is normal pulmonary artery systolic pressure. The  estimated right ventricular systolic pressure is 19.5 mmHg.   3. The mitral valve is normal in structure. Mild to moderate mitral valve  regurgitation.   4. The aortic valve is tricuspid. There is mild calcification of the  aortic valve. There is mild thickening of the aortic valve. Aortic valve  regurgitation is mild. Aortic valve sclerosis/calcification is present,  without any evidence of aortic  stenosis.   5. The inferior vena cava is normal in size with greater than 50%  respiratory variability, suggesting right atrial pressure of 3 mmHg.     Neuro/Psych    GI/Hepatic ,GERD  ,,(+) neg Cirrhosis        Endo/Other  Hypothyroidism    Renal/GU Renal InsufficiencyRenal disease     Musculoskeletal  (+) Arthritis ,     Abdominal   Peds  Hematology   Anesthesia Other Findings   Reproductive/Obstetrics                             Anesthesia Physical Anesthesia Plan  ASA: 2  Anesthesia Plan: Spinal   Post-op Pain Management:    Induction: Intravenous  PONV Risk Score and Plan: 2 and Ondansetron  and Dexamethasone   Airway Management Planned: Natural Airway and Simple Face Mask  Additional Equipment:   Intra-op Plan:   Post-operative Plan:   Informed Consent: I have reviewed the patients History and Physical, chart, labs and discussed the procedure including the risks, benefits and alternatives for the proposed anesthesia with the patient or authorized representative who has indicated his/her understanding and acceptance.     Dental advisory given  Plan Discussed with: CRNA  Anesthesia Plan Comments:         Anesthesia Quick Evaluation

## 2024-01-27 NOTE — Op Note (Signed)
 OPERATIVE REPORT  SURGEON: Redell Shoals, MD   ASSISTANT: Valery Potters, PA-C.  PREOPERATIVE DIAGNOSIS: Left hip arthritis.   POSTOPERATIVE DIAGNOSIS: Left hip arthritis.   PROCEDURE: Left total hip arthroplasty, anterior approach.   IMPLANTS: Biomet Taperloc Complete Microplasty stem, size 16 x 117, high offset. Biomet G7 OsseoTi Cup, size 52 mm. Biomet Vivacit-E liner, size 36 mm, E, neutral. Biomet metal head ball, size 36 - 3 mm.  ANESTHESIA:  MAC and Spinal  ESTIMATED BLOOD LOSS:-250 mL    ANTIBIOTICS: 2g Ancef .  DRAINS: None.  COMPLICATIONS: None.   CONDITION: PACU - hemodynamically stable.   BRIEF CLINICAL NOTE: Kaitlyn Parks is a 89 y.o. female with a long-standing history of Left hip arthritis. After failing conservative management, the patient was indicated for total hip arthroplasty. The risks, benefits, and alternatives to the procedure were explained, and the patient elected to proceed.  PROCEDURE IN DETAIL: Surgical site was marked by myself in the pre-op holding area. Once inside the operating room, spinal anesthesia was obtained, and a foley catheter was inserted. The patient was then positioned on the Hana table.  All bony prominences were well padded.  The hip was prepped and draped in the normal sterile surgical fashion.  A time-out was called verifying side and site of surgery. The patient received IV antibiotics within 60 minutes of beginning the procedure.   Bikini incision was made, and superficial dissection was performed lateral to the ASIS. The direct anterior approach to the hip was performed through the Hueter interval.  Lateral femoral circumflex vessels were treated with the Auqumantys. The anterior capsule was exposed and an inverted T capsulotomy was made. The femoral neck cut was made to the level of the templated cut.  A corkscrew was placed into the head and the head was removed.  The femoral head was found to have eburnated bone. The head  was passed to the back table and was measured. Pubofemoral ligament was released off of the calcar, taking care to stay on bone. Superior capsule was released from the greater trochanter, taking care to stay lateral to the posterior border of the femoral neck in order to preserve the short external rotators.   Acetabular exposure was achieved, and the pulvinar and labrum were excised. Sequential reaming of the acetabulum was then performed up to a size 51 mm reamer. A 52 mm cup was then opened and impacted into place at approximately 40 degrees of abduction and 20 degrees of anteversion. The final polyethylene liner was impacted into place and acetabular osteophytes were removed.    I then gained femoral exposure taking care to protect the abductors and greater trochanter.  This was performed using standard external rotation, extension, and adduction.  A cookie cutter was used to enter the femoral canal, and then the femoral canal finder was placed.  Sequential broaching was performed up to a size 16.  Calcar planer was used on the femoral neck remnant.  I placed a high offset neck and a trial head ball.  The hip was reduced.  Leg lengths and offset were checked fluoroscopically.  The hip was dislocated and trial components were removed.  The final implants were placed, and the hip was reduced.  Fluoroscopy was used to confirm component position and leg lengths.  At 90 degrees of external rotation and full extension, the hip was stable to an anterior directed force.   The wound was copiously irrigated with Prontosan solution and normal saline using pule lavage.  Marcaine  solution  was injected into the periarticular soft tissue.  The wound was closed in layers using #1 Vicryl and Stratafix for the fascia, 2-0 Vicryl for the subcutaneous fat, 2-0 Monocryl for the deep dermal layer, 3-0 running Monocryl subcuticular stitch, and Dermabond for the skin.  Once the glue was fully dried, an Aquacell Ag dressing was  applied.  The patient was transported to the recovery room in stable condition.  Sponge, needle, and instrument counts were correct at the end of the case x2.  The patient tolerated the procedure well and there were no known complications.  Please note that a surgical assistant was a medical necessity for this procedure to perform it in a safe and expeditious manner. Assistant was necessary to provide appropriate retraction of vital neurovascular structures, to prevent femoral fracture, and to allow for anatomic placement of the prosthesis.

## 2024-01-27 NOTE — Anesthesia Procedure Notes (Signed)
 Spinal  Patient location during procedure: OR Start time: 01/27/2024 10:58 AM End time: 01/27/2024 11:02 AM Reason for block: surgical anesthesia Staffing Performed: anesthesiologist  Anesthesiologist: Keneth Lynwood POUR, MD Performed by: Keneth Lynwood POUR, MD Authorized by: Keneth Lynwood POUR, MD   Preanesthetic Checklist Completed: patient identified, IV checked, site marked, risks and benefits discussed, surgical consent, monitors and equipment checked, pre-op evaluation and timeout performed Spinal Block Patient position: sitting Prep: DuraPrep Patient monitoring: heart rate, cardiac monitor, continuous pulse ox and blood pressure Approach: midline Location: L3-4 Injection technique: single-shot Needle Needle type: Sprotte  Needle gauge: 24 G Needle length: 9 cm Assessment Sensory level: T4 Events: CSF return

## 2024-01-27 NOTE — Discharge Instructions (Signed)

## 2024-01-28 ENCOUNTER — Other Ambulatory Visit (HOSPITAL_COMMUNITY): Payer: Self-pay

## 2024-01-28 ENCOUNTER — Encounter (HOSPITAL_COMMUNITY): Payer: Self-pay | Admitting: Orthopedic Surgery

## 2024-01-28 DIAGNOSIS — M1612 Unilateral primary osteoarthritis, left hip: Secondary | ICD-10-CM | POA: Diagnosis not present

## 2024-01-28 LAB — BASIC METABOLIC PANEL WITH GFR
Anion gap: 9 (ref 5–15)
BUN: 18 mg/dL (ref 8–23)
CO2: 22 mmol/L (ref 22–32)
Calcium: 8.8 mg/dL — ABNORMAL LOW (ref 8.9–10.3)
Chloride: 105 mmol/L (ref 98–111)
Creatinine, Ser: 0.81 mg/dL (ref 0.44–1.00)
GFR, Estimated: >60 mL/min (ref >60)
Glucose, Bld: 185 mg/dL — ABNORMAL HIGH (ref 70–99)
Potassium: 4 mmol/L (ref 3.5–5.1)
Sodium: 136 mmol/L (ref 135–145)

## 2024-01-28 LAB — CBC
HCT: 34.3 % — ABNORMAL LOW (ref 36.0–46.0)
Hemoglobin: 10.9 g/dL — ABNORMAL LOW (ref 12.0–15.0)
MCH: 30.4 pg (ref 26.0–34.0)
MCHC: 31.8 g/dL (ref 30.0–36.0)
MCV: 95.8 fL (ref 80.0–100.0)
Platelets: 209 10*3/uL (ref 150–400)
RBC: 3.58 MIL/uL — ABNORMAL LOW (ref 3.87–5.11)
RDW: 12.1 % (ref 11.5–15.5)
WBC: 13 10*3/uL — ABNORMAL HIGH (ref 4.0–10.5)
nRBC: 0 % (ref 0.0–0.2)

## 2024-01-28 MED ORDER — SENNA 8.6 MG PO TABS
2.0000 | ORAL_TABLET | Freq: Every day | ORAL | 0 refills | Status: DC
  Filled 2024-01-28: qty 30, 15d supply, fill #0

## 2024-01-28 MED ORDER — POLYETHYLENE GLYCOL 3350 17 GM/SCOOP PO POWD
17.0000 g | Freq: Every day | ORAL | 0 refills | Status: DC | PRN
  Filled 2024-01-28: qty 238, 14d supply, fill #0

## 2024-01-28 MED ORDER — ASPIRIN 81 MG PO CHEW
81.0000 mg | CHEWABLE_TABLET | Freq: Two times a day (BID) | ORAL | 0 refills | Status: AC
  Filled 2024-01-28: qty 90, 45d supply, fill #0

## 2024-01-28 MED ORDER — ONDANSETRON HCL 4 MG PO TABS
4.0000 mg | ORAL_TABLET | Freq: Three times a day (TID) | ORAL | 0 refills | Status: DC | PRN
  Filled 2024-01-28: qty 30, 10d supply, fill #0

## 2024-01-28 MED ORDER — HYDROCODONE-ACETAMINOPHEN 5-325 MG PO TABS
1.0000 | ORAL_TABLET | ORAL | 0 refills | Status: AC | PRN
  Filled 2024-01-28: qty 42, 7d supply, fill #0

## 2024-01-28 MED ORDER — PROCHLORPERAZINE MALEATE 10 MG PO TABS
10.0000 mg | ORAL_TABLET | Freq: Once | ORAL | Status: AC
  Administered 2024-01-28: 10 mg via ORAL
  Filled 2024-01-28: qty 1

## 2024-01-28 MED ORDER — DOCUSATE SODIUM 100 MG PO CAPS
100.0000 mg | ORAL_CAPSULE | Freq: Two times a day (BID) | ORAL | 0 refills | Status: DC
  Filled 2024-01-28: qty 60, 30d supply, fill #0

## 2024-01-28 NOTE — Progress Notes (Signed)
**Note De-Identified Kaitlyn Obfuscation**  Physical Therapy Treatment Patient Details Name: Kaitlyn Parks MRN: [id number] DOB: November 10, 1934 Today's Date: 01/28/2024   History of Present Illness Kaitlyn Parks is an 89 yo female s/p L THA AA 01/27/24. PMH:  CAD, cystocele, hypothyroidism, macular degeneration, NSTEMI, osteoporosis, rectocele, R THA 2018, scoliosis, uterine prolapse, urinary incontinence    PT Comments  Pt is s/p THA resulting in the deficits listed below (see PT Problem List). Pt from home with daughter, ind at baseline,  has chronic back pain from scoliosis per pt. On eval, pt significantly limited by pain, reports 2 hours of sleep due to pain and upon arrival had slid down to edge of bed with knee bend elevated on bed. Pt needing increased time to come to EOB, heavy use of bedrail and elevated HOB with increased effort to come to sitting. Pt needing min A to power up to standing with RW, verbal cues for WBAT and hand placement, pt with significant pain upon standing, yelling out in pain and quickly returning to sitting. On second STS pt able to maintain standing but does yell out in pain for initial ~60 seconds, cues for relaxation and UE support on RW, has significantly increased nausea requiring return to sitting without dry heaving and no active vomiting. Pt amb to/from restroom with slow step to gait pattern, decreased LLE stance time and weightbearing. Pt then amb out into hallway with RW, continued antalgic gait with short steps and decreased LLE stance and weightbearing, recliner follow for safety. Heavy education to pt and daughters regarding HEP, frequent short movement to minimize stiffness at home, ice for pain control, and all other questions answered. Pt has 2 steps to enter home and 1 step to get to bed on main level, so will need to safely ascend/descend steps prior to d/c home with family support. Pt will benefit from acute skilled PT to increase their independence and safety with mobility to facilitate discharge.       If plan is discharge home, recommend the following: A little help with walking and/or transfers;A little help with bathing/dressing/bathroom;Assistance with cooking/housework;Assist for transportation;Help with stairs or ramp for entrance   Can travel by private vehicle        Equipment Recommendations  None recommended by PT (RW delivered to room)    Recommendations for Other Services       Precautions / Restrictions Precautions Precautions: Fall Recall of Precautions/Restrictions: Intact Restrictions Weight Bearing Restrictions Per Provider Order: No     Mobility  Bed Mobility Overal bed mobility: Needs Assistance Bed Mobility: Supine to Sit     Supine to sit: Contact guard, HOB elevated, Used rails     General bed mobility comments: increased time and effort with HOB elevated, pt using bedrail, high pain resulting in increased time    Transfers Overall transfer level: Needs assistance Equipment used: Rolling walker (2 wheels) Transfers: Sit to/from Stand Sit to Stand: Min assist, Contact guard assist           General transfer comment: STS from EOB and BSC over toilet, min A for initial rise and CGA for following, verbal cues for hand placement to push to power up, weight shifted to R side for comfort with LLE slightly extended    Ambulation/Gait Ambulation/Gait assistance: Contact guard assist Gait Distance (Feet): 30 Feet (+10, 10, 14) Assistive device: Rolling walker (2 wheels) Gait Pattern/deviations: Step-to pattern, Decreased stance time - left, Decreased weight shift to left, Antalgic Gait velocity: decreased     General Gait  Details: slow step to gait pattern with decreased LLE stance time and weightbearing, no LE Buckling noted, verbal cues for sequencing and maintaining body within RW frame   Stairs             Wheelchair Mobility     Tilt Bed    Modified Rankin (Stroke Patients Only)       Balance Overall balance assessment:  Needs assistance Sitting-balance support: Feet supported Sitting balance-Leahy Scale: Fair     Standing balance support: Reliant on assistive device for balance, During functional activity, Bilateral upper extremity supported Standing balance-Leahy Scale: Poor                              Communication Communication Communication: No apparent difficulties  Cognition Arousal: Alert Behavior During Therapy: WFL for tasks assessed/performed   PT - Cognitive impairments: No apparent impairments                         Following commands: Intact      Cueing Cueing Techniques: Verbal cues, Tactile cues  Exercises      General Comments        Pertinent Vitals/Pain Pain Assessment Pain Assessment: 0-10 Pain Score: 9  Pain Location: L hip, L knee and left SI joint Pain Descriptors / Indicators: Burning (fire) Pain Intervention(s): Limited activity within patient's tolerance, Monitored during session, Repositioned, Ice applied, Utilized relaxation techniques    Home Living Family/patient expects to be discharged to:: Private residence Living Arrangements: Children Available Help at Discharge: Family;Available 24 hours/day Type of Home: House Home Access: Stairs to enter Entrance Stairs-Rails: Right;Left;Can reach both Entrance Stairs-Number of Steps: 2   Home Layout: Two level;1/2 bath on main level Home Equipment: Agricultural consultant (2 wheels);BSC/3in1 Additional Comments: bed moved to first level, half balth on main level; RW delivered to room on eval    Prior Function            PT Goals (current goals can now be found in the care plan section) Acute Rehab PT Goals Patient Stated Goal: return to aquatic exercise PT Goal Formulation: With patient/family Time For Goal Achievement: 02/11/24 Potential to Achieve Goals: Good    Frequency    7X/week      PT Plan      Co-evaluation              AM-PAC PT 6 Clicks Mobility    Outcome Measure  Help needed turning from your back to your side while in a flat bed without using bedrails?: A Little Help needed moving from lying on your back to sitting on the side of a flat bed without using bedrails?: A Little Help needed moving to and from a bed to a chair (including a wheelchair)?: A Little Help needed standing up from a chair using your arms (e.g., wheelchair or bedside chair)?: A Little Help needed to walk in hospital room?: A Little Help needed climbing 3-5 steps with a railing? : A Lot 6 Click Score: 17    End of Session Equipment Utilized During Treatment: Gait belt Activity Tolerance: Patient limited by pain;Other (comment) (nausea) Patient left: in chair;with call bell/phone within reach;with chair alarm set;with nursing/sitter in room;with family/visitor present Nurse Communication: Mobility status;Other (comment) (nurse administered nausea medication) PT Visit Diagnosis: Other abnormalities of gait and mobility (R26.89);Muscle weakness (generalized) (M62.81);Pain Pain - Right/Left: Left Pain - part of body: Hip  Time: 9086-8975 PT Time Calculation (min) (ACUTE ONLY): 71 min  Charges:    $Gait Training: 8-22 mins $Therapeutic Activity: 23-37 mins PT General Charges $$ ACUTE PT VISIT: 1 Visit                     Tori Tequisha Maahs PT, DPT 01/28/24, 12:31 PM

## 2024-01-28 NOTE — Plan of Care (Signed)
  Problem: Activity: Goal: Risk for activity intolerance will decrease Outcome: Progressing   Problem: Coping: Goal: Level of anxiety will decrease Outcome: Progressing   Problem: Pain Managment: Goal: General experience of comfort will improve and/or be controlled Outcome: Progressing   Problem: Safety: Goal: Ability to remain free from injury will improve Outcome: Progressing

## 2024-01-28 NOTE — Progress Notes (Signed)
    Subjective:  Patient reports pain as moderate.  Denies N/V/CP/SOB/Abd pain. She reports mostly pain in her left SI joint this morning. She reports she did not sleep well.  We discussed ice packs and heat packs on her back alternating to help with pain relief.  She denies any tingling or numbness in LE bilaterally today.   Objective:   VITALS:   Vitals:   01/27/24 1529 01/27/24 2112 01/28/24 0146 01/28/24 0621  BP: (!) 158/61 (!) 159/68 (!) 146/77 (!) 158/87  Pulse: 66 66 91 87  Resp: 18 18 18 16   Temp: (!) 97.4 F (36.3 C) 97.9 F (36.6 C) 97.9 F (36.6 C) 98 F (36.7 C)  TempSrc: Oral Oral Oral Oral  SpO2: 97% 96% 95% 95%  Weight:      Height:        NAD Neurologically intact ABD soft Neurovascular intact Sensation intact distally Intact pulses distally Dorsiflexion/Plantar flexion intact Incision: dressing C/D/I No cellulitis present Compartment soft She is moving legs well on exam, she is able to roll and turn in bed despite her reported pain level.   Lab Results  Component Value Date   WBC 13.0 (H) 01/28/2024   HGB 10.9 (L) 01/28/2024   HCT 34.3 (L) 01/28/2024   MCV 95.8 01/28/2024   PLT 209 01/28/2024   BMET    Component Value Date/Time   NA 136 01/28/2024 0315   K 4.0 01/28/2024 0315   CL 105 01/28/2024 0315   CO2 22 01/28/2024 0315   GLUCOSE 185 (H) 01/28/2024 0315   BUN 18 01/28/2024 0315   CREATININE 0.81 01/28/2024 0315   CREATININE 0.77 03/04/2020 0828   CALCIUM  8.8 (L) 01/28/2024 0315   CALCIUM  9.0 07/01/2010 2255   GFRNONAA >60 01/28/2024 0315     Assessment/Plan: 1 Day Post-Op   Principal Problem:   Primary osteoarthritis of left hip Active Problems:   S/P total left hip arthroplasty  ABLA. Hemoglobin 10.9. Continue to monitor.   WBAT with walker DVT ppx: Aspirin , SCDs, TEDS PO pain control PT/OT: to come today.  Dispo:  - D/c home with HEP once cleared with PT, voided, pain control.    Valery GORMAN Potters 01/28/2024, 7:55  AM   [facility]  [place] [address], [place], [place] [address] Phone: [phone] [email or web address] Facebook  [employer]

## 2024-01-28 NOTE — Progress Notes (Signed)
 Physical Therapy Treatment Patient Details Name: Kaitlyn Parks MRN: [id number] DOB: 08-Jun-1935 Today's Date: 01/28/2024   History of Present Illness Kaitlyn Parks is an 89 yo female s/p L THA AA 01/27/24. PMH:  CAD, cystocele, hypothyroidism, macular degeneration, NSTEMI, osteoporosis, rectocele, R THA 2018, scoliosis, uterine prolapse, urinary incontinence    PT Comments  Pt continues to be limited by pain in L hip, knee, SI joint and nausea. Pt with improved amb tolerance to 70 ft with RW, slow antalgic gait quickly getting weight off of LLE, CGA for safety, verbal cues for body positioning within RW frame. Initiated stair training this date, pt needing min A for 2 steps with bil handrails, cue for sequencing and UE assisting on handrails. Pt needing seated rest after gait and stair training due to increasing nausea. Notified nurse of nausea complaints.  Daughter provides picture clarification, pt has 2 steps without handrail from bedroom (bed in sunroom) up into the home. Pt has 3 steps to enter the home with bil handrails. Plan to progress mobility as able with goal to d/c home with family support.   If plan is discharge home, recommend the following: A little help with walking and/or transfers;A little help with bathing/dressing/bathroom;Assistance with cooking/housework;Assist for transportation;Help with stairs or ramp for entrance   Can travel by private vehicle        Equipment Recommendations  None recommended by PT (RW delivered to room)    Recommendations for Other Services       Precautions / Restrictions Precautions Precautions: Fall Recall of Precautions/Restrictions: Intact Restrictions Weight Bearing Restrictions Per Provider Order: No     Mobility  Bed Mobility Overal bed mobility: Needs Assistance Bed Mobility: Supine to Sit     Supine to sit: Contact guard, HOB elevated, Used rails     General bed mobility comments: in recliner    Transfers Overall  transfer level: Needs assistance Equipment used: Rolling walker (2 wheels) Transfers: Sit to/from Stand Sit to Stand: Contact guard assist           General transfer comment: verbal cues for hand placement ot power up, LLE slightly extended for comfort with rising and lowering    Ambulation/Gait Ambulation/Gait assistance: Contact guard assist Gait Distance (Feet): 70 Feet Assistive device: Rolling walker (2 wheels) Gait Pattern/deviations: Step-to pattern, Decreased stance time - left, Decreased weight shift to left, Antalgic Gait velocity: decreased     General Gait Details: step to gait pattern, decreased LLE stance time and weightbearing, verbal cues for body positioning within RW frame, limited by nausea   Stairs Stairs: Yes Stairs assistance: Min assist Stair Management: Two rails, Step to pattern, Forwards Number of Stairs: 2 General stair comments: step to gait pattern with bil handrial, 2 steps ascending normal height and 3 steps descending short height, heavy cues for UE assisting on handrails to decreased weight in LLE, able to maintain correct sequencing   Wheelchair Mobility     Tilt Bed    Modified Rankin (Stroke Patients Only)       Balance Overall balance assessment: Needs assistance Sitting-balance support: Feet supported Sitting balance-Leahy Scale: Fair     Standing balance support: Reliant on assistive device for balance, During functional activity, Bilateral upper extremity supported Standing balance-Leahy Scale: Poor                              Communication Communication Communication: No apparent difficulties  Cognition Arousal: Alert Behavior  During Therapy: WFL for tasks assessed/performed   PT - Cognitive impairments: No apparent impairments                         Following commands: Intact      Cueing Cueing Techniques: Verbal cues, Tactile cues  Exercises      General Comments        Pertinent  Vitals/Pain Pain Assessment Pain Assessment: 0-10 Pain Score: 7  Pain Location: L hip, L knee and left SI joint Pain Descriptors / Indicators: Burning Pain Intervention(s): Limited activity within patient's tolerance, Monitored during session, Repositioned, Utilized relaxation techniques    Home Living Family/patient expects to be discharged to:: Private residence Living Arrangements: Children Available Help at Discharge: Family;Available 24 hours/day Type of Home: House Home Access: Stairs to enter Entrance Stairs-Rails: Right;Left;Can reach both Entrance Stairs-Number of Steps: 2   Home Layout: Two level;1/2 bath on main level Home Equipment: Agricultural consultant (2 wheels);BSC/3in1 Additional Comments: bed moved to first level, half balth on main level; RW delivered to room on eval    Prior Function            PT Goals (current goals can now be found in the care plan section) Acute Rehab PT Goals Patient Stated Goal: return to aquatic exercise PT Goal Formulation: With patient/family Time For Goal Achievement: 02/11/24 Potential to Achieve Goals: Good Progress towards PT goals: Progressing toward goals    Frequency    7X/week      PT Plan      Co-evaluation              AM-PAC PT 6 Clicks Mobility   Outcome Measure  Help needed turning from your back to your side while in a flat bed without using bedrails?: A Little Help needed moving from lying on your back to sitting on the side of a flat bed without using bedrails?: A Little Help needed moving to and from a bed to a chair (including a wheelchair)?: A Little Help needed standing up from a chair using your arms (e.g., wheelchair or bedside chair)?: A Little Help needed to walk in hospital room?: A Little Help needed climbing 3-5 steps with a railing? : A Little 6 Click Score: 18    End of Session Equipment Utilized During Treatment: Gait belt Activity Tolerance: Patient limited by pain;Other (comment)  (nausea) Patient left: in chair;with call bell/phone within reach;with chair alarm set;with family/visitor present Nurse Communication: Mobility status;Other (comment) (nausea) PT Visit Diagnosis: Other abnormalities of gait and mobility (R26.89);Muscle weakness (generalized) (M62.81);Pain Pain - Right/Left: Left Pain - part of body: Hip     Time: 8660-8581 PT Time Calculation (min) (ACUTE ONLY): 39 min  Charges:    $Gait Training: 23-37 mins $Therapeutic Activity: 8-22 mins PT General Charges $$ ACUTE PT VISIT: 1 Visit                     Tori Yaslene Lindamood PT, DPT 01/28/24, 3:17 PM

## 2024-01-28 NOTE — TOC Transition Note (Signed)
 Transition of Care [facility]) - Discharge Note   Patient Details  Name: Kaitlyn Parks MRN: [id number] Date of Birth: 1934/09/08  [facility]) CM/SW Contact:  Sheri ONEIDA Sharps, LCSW Phone Number: 01/28/2024, 10:24 AM   Clinical Narrative:    Pt d/c home with dtr. Therapy plan HEP, RW provided by [employer]. No TOC needs.    Final next level of care: Home/Self Care Barriers to Discharge: No Barriers Identified   Patient Goals and CMS Choice Patient states their goals for this hospitalization and ongoing recovery are:: return home   Choice offered to / list presented to : NA      Discharge Placement                       Discharge Plan and Services Additional resources added to the After Visit Summary for   In-house Referral: NA Discharge Planning Services: NA            DME Arranged: Walker rolling DME Agency: [employer] Date DME Agency Contacted: 01/28/24 Time DME Agency Contacted: 0900              Social Drivers of Health (SDOH) Interventions SDOH Screenings   Food Insecurity: No Food Insecurity (01/27/2024)  Housing: Low Risk  (01/27/2024)  Transportation Needs: No Transportation Needs (01/27/2024)  Utilities: Not At Risk (01/27/2024)  Alcohol  Screen: Low Risk  (08/13/2023)  Depression (PHQ2-9): Low Risk  (08/13/2023)  Financial Resource Strain: Low Risk  (10/27/2023)  Physical Activity: Sufficiently Active (08/13/2023)  Social Connections: Moderately Integrated (01/27/2024)  Stress: No Stress Concern Present (08/13/2023)  Tobacco Use: Low Risk  (01/27/2024)  Health Literacy: Adequate Health Literacy (08/13/2023)     Readmission Risk Interventions    06/15/2022    1:35 PM  Readmission Risk Prevention Plan  Post Dischage Appt Complete  Medication Screening Complete  Transportation Screening Complete

## 2024-01-29 DIAGNOSIS — M1612 Unilateral primary osteoarthritis, left hip: Secondary | ICD-10-CM | POA: Diagnosis not present

## 2024-01-29 LAB — BASIC METABOLIC PANEL WITH GFR
Anion gap: 8 (ref 5–15)
BUN: 20 mg/dL (ref 8–23)
CO2: 24 mmol/L (ref 22–32)
Calcium: 8.4 mg/dL — ABNORMAL LOW (ref 8.9–10.3)
Chloride: 104 mmol/L (ref 98–111)
Creatinine, Ser: 0.49 mg/dL (ref 0.44–1.00)
GFR, Estimated: >60 mL/min (ref >60)
Glucose, Bld: 106 mg/dL — ABNORMAL HIGH (ref 70–99)
Potassium: 4 mmol/L (ref 3.5–5.1)
Sodium: 136 mmol/L (ref 135–145)

## 2024-01-29 LAB — CBC
HCT: 31.5 % — ABNORMAL LOW (ref 36.0–46.0)
Hemoglobin: 9.9 g/dL — ABNORMAL LOW (ref 12.0–15.0)
MCH: 31.1 pg (ref 26.0–34.0)
MCHC: 31.4 g/dL (ref 30.0–36.0)
MCV: 99.1 fL (ref 80.0–100.0)
Platelets: 182 10*3/uL (ref 150–400)
RBC: 3.18 MIL/uL — ABNORMAL LOW (ref 3.87–5.11)
RDW: 12.4 % (ref 11.5–15.5)
WBC: 8.8 10*3/uL (ref 4.0–10.5)
nRBC: 0 % (ref 0.0–0.2)

## 2024-01-29 MED ORDER — PROMETHAZINE HCL 12.5 MG PO TABS: 12.5000 mg | ORAL_TABLET | Freq: Four times a day (QID) | ORAL | 0 refills | Status: DC | PRN

## 2024-01-29 NOTE — Progress Notes (Signed)
 Reviewed written d/c instructions w pt and her daughter, all questions answered and they both verbalized understanding. D/c via w/c w all belongings in stable condition.

## 2024-01-29 NOTE — Progress Notes (Signed)
 Physical Therapy Treatment Patient Details Name: Kaitlyn Parks MRN: [id number] DOB: July 23, 1935 Today's Date: 01/29/2024   History of Present Illness Kaitlyn Parks is an 89 yo female s/p L THA AA 01/27/24. PMH:  CAD, cystocele, hypothyroidism, macular degeneration, NSTEMI, osteoporosis, rectocele, R THA 2018, scoliosis, uterine prolapse, urinary incontinence    PT Comments  Pt amb in room and into hallway with RW, improved LLE stance time though still shorter than R side, no LE buckling noted, step to pattern, able to complete turns and navigate around obstacles without unsteadiness. Completed stair training with SPC in R hand and therapist providing HHA in L, min A to ascend/descend 4 steps. Pt's daughter completed stair training with HHA in L hand and R SPC and therapist providing supv. Pt does actively vomit between stair training with PT and daughter, able to recover and continue session, VSS and notified RN. Pt's daughter reports she is a physical therapist, educated on HEP, mobility, and stairs, all questions answered; pt reports ready to d/c home with family support.    If plan is discharge home, recommend the following: A little help with walking and/or transfers;A little help with bathing/dressing/bathroom;Assistance with cooking/housework;Assist for transportation;Help with stairs or ramp for entrance   Can travel by private vehicle        Equipment Recommendations  None recommended by PT (RW delivered to room)    Recommendations for Other Services       Precautions / Restrictions Precautions Precautions: Fall Recall of Precautions/Restrictions: Intact Restrictions Weight Bearing Restrictions Per Provider Order: No     Mobility  Bed Mobility Overal bed mobility: Needs Assistance Bed Mobility: Supine to Sit     Supine to sit: Supervision, Used rails     General bed mobility comments: supv with pt pulling on bedrail to mobilize to EOB    Transfers Overall transfer  level: Needs assistance Equipment used: Rolling walker (2 wheels) Transfers: Sit to/from Stand Sit to Stand: Supervision           General transfer comment: verbal cues for hand placement, LLE slightly extended for comfort    Ambulation/Gait Ambulation/Gait assistance: Supervision Gait Distance (Feet): 50 Feet Assistive device: Rolling walker (2 wheels) Gait Pattern/deviations: Step-to pattern, Decreased stance time - left, Decreased weight shift to left Gait velocity: decreased     General Gait Details: step to gait pattern with improved LLE stance time, cues for heel weightbearing to activate glutes, no LE buckling, nausea limiting   Stairs Stairs: Yes Stairs assistance: Min assist Stair Management: Forwards, With cane, Step to pattern Number of Stairs: 6 General stair comments: pt ascends/descends steps with SPC in R hand and HHA in L hand, able to maintain correct sequencing, verbal cues for UE assist when mobilizing RLE; pt completes 4 steps with therapist and 2 steps with daugther Kaitlyn Parks   Wheelchair Mobility     Tilt Bed    Modified Rankin (Stroke Patients Only)       Balance Overall balance assessment: Needs assistance Sitting-balance support: Feet supported Sitting balance-Leahy Scale: Good     Standing balance support: Reliant on assistive device for balance, During functional activity, Bilateral upper extremity supported Standing balance-Leahy Scale: Poor                              Communication Communication Communication: No apparent difficulties  Cognition Arousal: Alert Behavior During Therapy: WFL for tasks assessed/performed   PT - Cognitive impairments: No apparent  impairments                         Following commands: Intact      Cueing Cueing Techniques: Verbal cues  Exercises      General Comments General comments (skin integrity, edema, etc.): active vomiting post stair training, VSS BP 167/79, HR 63  and SpO2 97%      Pertinent Vitals/Pain Pain Assessment Pain Assessment: 0-10 Pain Score: 7  Pain Location: L hip/quad, knee and SI joint Pain Descriptors / Indicators: Burning Pain Intervention(s): Limited activity within patient's tolerance, Monitored during session, Premedicated before session, Repositioned    Home Living                          Prior Function            PT Goals (current goals can now be found in the care plan section) Acute Rehab PT Goals Patient Stated Goal: return to aquatic exercise PT Goal Formulation: With patient/family Time For Goal Achievement: 02/11/24 Potential to Achieve Goals: Good Progress towards PT goals: Progressing toward goals    Frequency    7X/week      PT Plan      Co-evaluation              AM-PAC PT 6 Clicks Mobility   Outcome Measure  Help needed turning from your back to your side while in a flat bed without using bedrails?: A Little Help needed moving from lying on your back to sitting on the side of a flat bed without using bedrails?: A Little Help needed moving to and from a bed to a chair (including a wheelchair)?: A Little Help needed standing up from a chair using your arms (e.g., wheelchair or bedside chair)?: A Little Help needed to walk in hospital room?: A Little Help needed climbing 3-5 steps with a railing? : A Little 6 Click Score: 18    End of Session Equipment Utilized During Treatment: Gait belt Activity Tolerance: Other (comment);Patient tolerated treatment well (nausea) Patient left: in chair;with call bell/phone within reach;with chair alarm set;with family/visitor present Nurse Communication: Mobility status;Other (comment) (nausea medication administered) PT Visit Diagnosis: Other abnormalities of gait and mobility (R26.89);Muscle weakness (generalized) (M62.81);Pain Pain - Right/Left: Left Pain - part of body: Hip     Time: 1212-1247 PT Time Calculation (min) (ACUTE  ONLY): 35 min  Charges:    $Gait Training: 23-37 mins PT General Charges $$ ACUTE PT VISIT: 1 Visit                     Tori Windsor Goeken PT, DPT 01/29/24, 1:14 PM

## 2024-01-29 NOTE — Progress Notes (Signed)
 TOC meds in a secure bag delivered to room by thisa RN, patient outside room working on stair training, meds placed on window seat

## 2024-01-29 NOTE — Progress Notes (Signed)
 Subjective: 2 Days Post-Op Procedure(s) (LRB): ARTHROPLASTY, HIP, TOTAL, ANTERIOR APPROACH (Left) Patient reports pain as mild.   Patient seen in rounds for Dr. Fidel. Patient is resting in bed on exam this morning. No acute events overnight. Denies abdominal pain, chest pain. Patient ambulated 70 feet with PT yesterday. We will continue therapy today.   Objective: Vital signs in last 24 hours: Temp:  [98.5 F (36.9 C)-98.6 F (37 C)] 98.5 F (36.9 C) (06/28 0626) Pulse Rate:  [60-71] 65 (06/28 0626) Resp:  [15-18] 15 (06/28 0626) BP: (127-152)/(67-76) 136/68 (06/28 0626) SpO2:  [95 %-98 %] 95 % (06/28 0626)  Intake/Output from previous day:  Intake/Output Summary (Last 24 hours) at 01/29/2024 0841 Last data filed at 01/29/2024 0600 Gross per 24 hour  Intake 703.19 ml  Output --  Net 703.19 ml     Intake/Output this shift: No intake/output data recorded.  Labs: Recent Labs    01/27/24 0830 01/28/24 0315 01/29/24 0349  HGB 12.6 10.9* 9.9*   Recent Labs    01/28/24 0315 01/29/24 0349  WBC 13.0* 8.8  RBC 3.58* 3.18*  HCT 34.3* 31.5*  PLT 209 182   Recent Labs    01/28/24 0315 01/29/24 0349  NA 136 136  K 4.0 4.0  CL 105 104  CO2 22 24  BUN 18 20  CREATININE 0.81 0.49  GLUCOSE 185* 106*  CALCIUM  8.8* 8.4*   No results for input(s): LABPT, INR in the last 72 hours.  Exam: General - Patient is Alert and Oriented Extremity - Neurologically intact Sensation intact distally Intact pulses distally Dorsiflexion/Plantar flexion intact Dressing - dressing C/D/I Motor Function - intact, moving foot and toes well on exam.   Past Medical History:  Diagnosis Date   Arthritis    DDD, scoliosis, sees Dr. Mavis for this, uses norco very rarely for pain   Arthritis    lumbar stenosis, gets steroid injections for [facility]   Bradycardia 11/11/2017   CAD (coronary artery disease)    LAD stenting of a 90% lesion 2012   Cystocele    Educated  about COVID-19 virus infection 12/06/2019   Elevated cholesterol    GERD (gastroesophageal reflux disease)    dx in work up 2016 for atypical CP at OSH   pt. denies   Heart murmur    Hypertensive retinopathy    OU   Hypothyroidism    Interstitial cystitis    sees Dr. Watt   Macular degeneration    OU   Neuromuscular disorder [facility])    neuropathy   NSTEMI (non-ST elevated myocardial infarction) (HCC)    Osteoporosis    Rectocele    S/P hip replacement, right 06/12/2017   Scoliosis    Thyroid  disease    Hypothyroid   Urinary incontinence    USI   Uterine prolapse     Assessment/Plan: 2 Days Post-Op Procedure(s) (LRB): ARTHROPLASTY, HIP, TOTAL, ANTERIOR APPROACH (Left) Principal Problem:   Primary osteoarthritis of left hip Active Problems:   S/P total left hip arthroplasty  Estimated body mass index is 21.95 kg/m as calculated from the following:   Height as of this encounter: 5' 3 (1.6 m).   Weight as of this encounter: 56.2 kg. Advance diet Up with therapy D/C IV fluids  DVT Prophylaxis - Aspirin  Weight bearing as tolerated.  Hgb stable at 9.9 this AM  Plan is to go Home after hospital stay. Plan for possible discharge today if she is able to  meet goals with  therapy. Follow up in the office in 2 weeks.   Rosina Calin, PA-C Orthopedic Surgery [phone] 01/29/2024, 8:41 AM

## 2024-01-29 NOTE — Progress Notes (Signed)
 Physical Therapy Treatment Patient Details Name: Kaitlyn Parks MRN: [id number] DOB: July 26, 1935 Today's Date: 01/29/2024   History of Present Illness Kaitlyn Parks is an 89 yo female s/p L THA AA 01/27/24. PMH:  CAD, cystocele, hypothyroidism, macular degeneration, NSTEMI, osteoporosis, rectocele, R THA 2018, scoliosis, uterine prolapse, urinary incontinence    PT Comments  Pt continues to be limited by chronic back pain as well as post-op L hip pain and nausea. Pt amb 70 ft with RW, slow, antalgic, step to gait pattern, no LE buckling or overt LOB. Continued stair training with SPC to simulate home steps from sunroom/bedroom to rest of the home (2 steps without handrail), but pt prefers to hold to L handrail with SPC in R, min A to ascend steps and descend safely. After amb and stair training, pt needing prompt seated rest break with dry heaving and nausea complaints. Pt and daughter verbalizing concerns with pain and nausea as well mobility at home. Plan to continue progressing mobility as able.     If plan is discharge home, recommend the following: A little help with walking and/or transfers;A little help with bathing/dressing/bathroom;Assistance with cooking/housework;Assist for transportation;Help with stairs or ramp for entrance   Can travel by private vehicle        Equipment Recommendations  None recommended by PT (RW delivered to room)    Recommendations for Other Services       Precautions / Restrictions Precautions Precautions: Fall Recall of Precautions/Restrictions: Intact Restrictions Weight Bearing Restrictions Per Provider Order: No     Mobility  Bed Mobility Overal bed mobility: Needs Assistance Bed Mobility: Supine to Sit     Supine to sit: Contact guard, HOB elevated, Used rails     General bed mobility comments: heavy use of bedrail to mobilize to EOB and push up into siting EOB, increased time and effort    Transfers Overall transfer level: Needs  assistance Equipment used: Rolling walker (2 wheels) Transfers: Sit to/from Stand Sit to Stand: Contact guard assist           General transfer comment: cues for hand placement, slow to power up, LLE slightly extended and weight shifted to R    Ambulation/Gait Ambulation/Gait assistance: Contact guard assist Gait Distance (Feet): 70 Feet Assistive device: Rolling walker (2 wheels) Gait Pattern/deviations: Step-to pattern, Decreased stance time - left, Decreased weight shift to left, Antalgic Gait velocity: decreased     General Gait Details: step-to gait pattern, decreased LLE stance and weightbearing, quickly off-weighting L foot, cues for flat foot posture on L to promote heel contact, no LE buckling, limited by nausea   Stairs Stairs: Yes Stairs assistance: Min assist Stair Management: One rail Left, Forwards, With cane Number of Stairs: 2 General stair comments: pt ascends 2 steps normal height and descends 3 steps short height, heavy cues for sequencing with SPC and UE assist to decrease weight on LLE, pt appears anxious needing heavy cues and encouragement, significant nausea with stair training requiring prompt seated rest with dry heaving   Wheelchair Mobility     Tilt Bed    Modified Rankin (Stroke Patients Only)       Balance Overall balance assessment: Needs assistance Sitting-balance support: Feet supported Sitting balance-Leahy Scale: Good     Standing balance support: Reliant on assistive device for balance, During functional activity, Bilateral upper extremity supported Standing balance-Leahy Scale: Poor  Communication Communication Communication: No apparent difficulties  Cognition Arousal: Alert Behavior During Therapy: WFL for tasks assessed/performed   PT - Cognitive impairments: No apparent impairments                         Following commands: Intact      Cueing Cueing Techniques:  Verbal cues  Exercises      General Comments        Pertinent Vitals/Pain Pain Assessment Pain Assessment: 0-10 Pain Score: 7  Pain Location: L hip/quad, knee and SI joint Pain Descriptors / Indicators: Burning Pain Intervention(s): Limited activity within patient's tolerance, Monitored during session, Repositioned, Ice applied    Home Living                          Prior Function            PT Goals (current goals can now be found in the care plan section) Acute Rehab PT Goals Patient Stated Goal: return to aquatic exercise PT Goal Formulation: With patient/family Time For Goal Achievement: 02/11/24 Potential to Achieve Goals: Good Progress towards PT goals: Progressing toward goals    Frequency    7X/week      PT Plan      Co-evaluation              AM-PAC PT 6 Clicks Mobility   Outcome Measure  Help needed turning from your back to your side while in a flat bed without using bedrails?: A Little Help needed moving from lying on your back to sitting on the side of a flat bed without using bedrails?: A Little Help needed moving to and from a bed to a chair (including a wheelchair)?: A Little Help needed standing up from a chair using your arms (e.g., wheelchair or bedside chair)?: A Little Help needed to walk in hospital room?: A Little Help needed climbing 3-5 steps with a railing? : A Little 6 Click Score: 18    End of Session Equipment Utilized During Treatment: Gait belt Activity Tolerance: Patient limited by pain;Other (comment) (nausea) Patient left: in chair;with call bell/phone within reach;with chair alarm set;with family/visitor present Nurse Communication: Mobility status;Other (comment) (nausea medication administered) PT Visit Diagnosis: Other abnormalities of gait and mobility (R26.89);Muscle weakness (generalized) (M62.81);Pain Pain - Right/Left: Left Pain - part of body: Hip     Time: 9140-9063 PT Time Calculation  (min) (ACUTE ONLY): 37 min  Charges:    $Gait Training: 23-37 mins PT General Charges $$ ACUTE PT VISIT: 1 Visit                     Tori Anntoinette Haefele PT, DPT 01/29/24, 9:55 AM

## 2024-01-29 NOTE — Plan of Care (Signed)
  Problem: Education: Goal: Knowledge of General Education information will improve Description: Including pain rating scale, medication(s)/side effects and non-pharmacologic comfort measures Outcome: Adequate for Discharge   Problem: Health Behavior/Discharge Planning: Goal: Ability to manage health-related needs will improve Outcome: Adequate for Discharge   Problem: Clinical Measurements: Goal: Ability to maintain clinical measurements within normal limits will improve Outcome: Adequate for Discharge Goal: Will remain free from infection Outcome: Adequate for Discharge Goal: Diagnostic test results will improve Outcome: Adequate for Discharge Goal: Respiratory complications will improve Outcome: Adequate for Discharge Goal: Cardiovascular complication will be avoided Outcome: Adequate for Discharge   Problem: Activity: Goal: Risk for activity intolerance will decrease Outcome: Adequate for Discharge   Problem: Nutrition: Goal: Adequate nutrition will be maintained Outcome: Adequate for Discharge   Problem: Coping: Goal: Level of anxiety will decrease Outcome: Adequate for Discharge   Problem: Elimination: Goal: Will not experience complications related to bowel motility Outcome: Adequate for Discharge Goal: Will not experience complications related to urinary retention Outcome: Adequate for Discharge   Problem: Pain Managment: Goal: General experience of comfort will improve and/or be controlled Outcome: Adequate for Discharge   Problem: Safety: Goal: Ability to remain free from injury will improve Outcome: Adequate for Discharge   Problem: Skin Integrity: Goal: Risk for impaired skin integrity will decrease Outcome: Adequate for Discharge   Problem: Education: Goal: Knowledge of the prescribed therapeutic regimen will improve Outcome: Adequate for Discharge Goal: Understanding of discharge needs will improve Outcome: Adequate for Discharge Goal:  Individualized Educational Video(s) Outcome: Adequate for Discharge   Problem: Activity: Goal: Ability to avoid complications of mobility impairment will improve Outcome: Adequate for Discharge Goal: Ability to tolerate increased activity will improve Outcome: Adequate for Discharge   Problem: Clinical Measurements: Goal: Postoperative complications will be avoided or minimized Outcome: Adequate for Discharge   Problem: Pain Management: Goal: Pain level will decrease with appropriate interventions Outcome: Adequate for Discharge   Problem: Skin Integrity: Goal: Will show signs of wound healing Outcome: Adequate for Discharge

## 2024-02-07 ENCOUNTER — Emergency Department (HOSPITAL_BASED_OUTPATIENT_CLINIC_OR_DEPARTMENT_OTHER)

## 2024-02-07 ENCOUNTER — Ambulatory Visit: Payer: Self-pay

## 2024-02-07 ENCOUNTER — Observation Stay (HOSPITAL_BASED_OUTPATIENT_CLINIC_OR_DEPARTMENT_OTHER)
Admission: EM | Admit: 2024-02-07 | Discharge: 2024-02-09 | Disposition: A | Discharge status: Home / Self Care | Attending: Internal Medicine | Admitting: Internal Medicine

## 2024-02-07 ENCOUNTER — Encounter (HOSPITAL_BASED_OUTPATIENT_CLINIC_OR_DEPARTMENT_OTHER): Payer: Self-pay

## 2024-02-07 DIAGNOSIS — I2510 Atherosclerotic heart disease of native coronary artery without angina pectoris: Secondary | ICD-10-CM | POA: Insufficient documentation

## 2024-02-07 DIAGNOSIS — I10 Essential (primary) hypertension: Secondary | ICD-10-CM | POA: Insufficient documentation

## 2024-02-07 DIAGNOSIS — E871 Hypo-osmolality and hyponatremia: Principal | ICD-10-CM | POA: Insufficient documentation

## 2024-02-07 DIAGNOSIS — Z96642 Presence of left artificial hip joint: Secondary | ICD-10-CM | POA: Diagnosis not present

## 2024-02-07 DIAGNOSIS — D649 Anemia, unspecified: Secondary | ICD-10-CM | POA: Insufficient documentation

## 2024-02-07 DIAGNOSIS — E785 Hyperlipidemia, unspecified: Secondary | ICD-10-CM | POA: Insufficient documentation

## 2024-02-07 DIAGNOSIS — Z7982 Long term (current) use of aspirin: Secondary | ICD-10-CM | POA: Insufficient documentation

## 2024-02-07 DIAGNOSIS — R1110 Vomiting, unspecified: Secondary | ICD-10-CM | POA: Diagnosis present

## 2024-02-07 DIAGNOSIS — R1084 Generalized abdominal pain: Secondary | ICD-10-CM

## 2024-02-07 DIAGNOSIS — R197 Diarrhea, unspecified: Secondary | ICD-10-CM | POA: Diagnosis not present

## 2024-02-07 DIAGNOSIS — Z66 Do not resuscitate: Secondary | ICD-10-CM | POA: Diagnosis not present

## 2024-02-07 DIAGNOSIS — K219 Gastro-esophageal reflux disease without esophagitis: Principal | ICD-10-CM | POA: Insufficient documentation

## 2024-02-07 DIAGNOSIS — E876 Hypokalemia: Secondary | ICD-10-CM | POA: Diagnosis not present

## 2024-02-07 DIAGNOSIS — K279 Peptic ulcer, site unspecified, unspecified as acute or chronic, without hemorrhage or perforation: Secondary | ICD-10-CM

## 2024-02-07 DIAGNOSIS — D696 Thrombocytopenia, unspecified: Secondary | ICD-10-CM | POA: Diagnosis not present

## 2024-02-07 DIAGNOSIS — R112 Nausea with vomiting, unspecified: Secondary | ICD-10-CM | POA: Diagnosis present

## 2024-02-07 DIAGNOSIS — R1012 Left upper quadrant pain: Secondary | ICD-10-CM

## 2024-02-07 DIAGNOSIS — E039 Hypothyroidism, unspecified: Secondary | ICD-10-CM | POA: Diagnosis present

## 2024-02-07 DIAGNOSIS — R109 Unspecified abdominal pain: Secondary | ICD-10-CM | POA: Diagnosis present

## 2024-02-07 LAB — COMPREHENSIVE METABOLIC PANEL WITH GFR
ALT: 17 U/L (ref 0–44)
AST: 22 U/L (ref 15–41)
Albumin: 3.6 g/dL (ref 3.5–5.0)
Alkaline Phosphatase: 97 U/L (ref 38–126)
Anion gap: 12 (ref 5–15)
BUN: 11 mg/dL (ref 8–23)
CO2: 22 mmol/L (ref 22–32)
Calcium: 9 mg/dL (ref 8.9–10.3)
Chloride: 90 mmol/L — ABNORMAL LOW (ref 98–111)
Creatinine, Ser: 0.54 mg/dL (ref 0.44–1.00)
GFR, Estimated: >60 mL/min (ref >60)
Glucose, Bld: 107 mg/dL — ABNORMAL HIGH (ref 70–99)
Potassium: 3.6 mmol/L (ref 3.5–5.1)
Sodium: 125 mmol/L — ABNORMAL LOW (ref 135–145)
Total Bilirubin: 0.4 mg/dL (ref 0.0–1.2)
Total Protein: 6 g/dL — ABNORMAL LOW (ref 6.5–8.1)

## 2024-02-07 LAB — CBC WITH DIFFERENTIAL/PLATELET
Abs Immature Granulocytes: 0.06 K/uL (ref 0.00–0.07)
Basophils Absolute: 0 K/uL (ref 0.0–0.1)
Basophils Relative: 0 %
Eosinophils Absolute: 0.1 K/uL (ref 0.0–0.5)
Eosinophils Relative: 1 %
HCT: 30.2 % — ABNORMAL LOW (ref 36.0–46.0)
Hemoglobin: 10.1 g/dL — ABNORMAL LOW (ref 12.0–15.0)
Immature Granulocytes: 1 %
Lymphocytes Relative: 9 %
Lymphs Abs: 0.8 K/uL (ref 0.7–4.0)
MCH: 30.6 pg (ref 26.0–34.0)
MCHC: 33.4 g/dL (ref 30.0–36.0)
MCV: 91.5 fL (ref 80.0–100.0)
Monocytes Absolute: 0.6 K/uL (ref 0.1–1.0)
Monocytes Relative: 7 %
Neutro Abs: 6.8 K/uL (ref 1.7–7.7)
Neutrophils Relative %: 82 %
Platelets: 421 K/uL — ABNORMAL HIGH (ref 150–400)
RBC: 3.3 MIL/uL — ABNORMAL LOW (ref 3.87–5.11)
RDW: 12.6 % (ref 11.5–15.5)
WBC: 8.4 K/uL (ref 4.0–10.5)
nRBC: 0 % (ref 0.0–0.2)

## 2024-02-07 LAB — PROTIME-INR
INR: 0.9 (ref 0.8–1.2)
Prothrombin Time: 12.7 s (ref 11.4–15.2)

## 2024-02-07 MED ORDER — LACTATED RINGERS IV BOLUS
1000.0000 mL | Freq: Once | INTRAVENOUS | Status: AC
  Administered 2024-02-08: 1000 mL via INTRAVENOUS

## 2024-02-07 MED ORDER — IOHEXOL 300 MG/ML  SOLN
100.0000 mL | Freq: Once | INTRAMUSCULAR | Status: AC | PRN
  Administered 2024-02-07: 85 mL via INTRAVENOUS

## 2024-02-07 MED ORDER — PROCHLORPERAZINE EDISYLATE 10 MG/2ML IJ SOLN
10.0000 mg | Freq: Once | INTRAMUSCULAR | Status: AC
  Administered 2024-02-08: 10 mg via INTRAVENOUS
  Filled 2024-02-07: qty 2

## 2024-02-07 MED ORDER — FENTANYL CITRATE PF 50 MCG/ML IJ SOSY
50.0000 ug | PREFILLED_SYRINGE | Freq: Once | INTRAMUSCULAR | Status: AC
  Administered 2024-02-08: 50 ug via INTRAVENOUS
  Filled 2024-02-07: qty 1

## 2024-02-07 NOTE — Telephone Encounter (Signed)
 FYI Only or Action Required?: Action required by provider: clinical question for provider.  Patient was last seen in primary care on 11/30/2023 by Micheal Wolm ORN, MD.  Called Nurse Triage reporting Diarrhea and Advice Only.  Symptoms began N/A.  Interventions attempted: OTC medications: Imodium .  Symptoms are: unchanged.  Triage Disposition: Call PCP When Office is Open  Patient/caregiver understands and will follow disposition?: Yes                              Copied from CRM [id number]. Topic: Clinical - Red Word Triage >> Feb 07, 2024  4:40 PM Gennette ORN wrote: Red Word that prompted transfer to Nurse Triage: Patient is still havinmg severe stomach pains and cramps , she also took what the nurse referred her to take around lunch. But it is still not working. Reason for Disposition  [1] Caller requesting NON-URGENT health information AND [2] PCP's office is the best resource  Answer Assessment - Initial Assessment Questions 1. REASON FOR CALL or QUESTION: What is your reason for calling today? or How can I best help you? or What question do you have that I can help answer?     Patient was triaged this morning by another RN. Patient has been experiencing diarrhea and is seeking advice from provider on medications she can take to help symptoms. Patient denied new/worsening symptoms. Please advise on medication recommendations.  Protocols used: Information Only Call - No Triage-A-AH

## 2024-02-07 NOTE — ED Provider Notes (Signed)
 [place] EMERGENCY DEPARTMENT AT [facility]  Provider Note  CSN: [id number] Arrival date & time: 02/07/24 2034  History Chief Complaint  Patient presents with   Abdominal Pain    Kaitlyn Parks is a 89 y.o. female here with family for evaluation of abdominal pain, diarrhea and vomiting. She had an elective hip replacement about 2 weeks ago. The pain from the surgery is fairly well controlled, but a few days ago, she began to have a diffuse burning abdominal pain, then 2 days ago began having diarrhea and today some vomiting. She has not had a fever. She reports stools have been dark after taking pepto-bismol, but no BRBPR. She had a UTI prior to surgery and completed Abx then.    Home Medications Prior to Admission medications   Medication Sig Start Date End Date Taking? Authorizing Provider  Aflibercept  (EYLEA  IZ) by Intravitreal route See admin instructions. Every 7 weeks    [provider]  amLODipine  (NORVASC ) 2.5 MG tablet Take 1 tablet (2.5 mg total) by mouth daily. Patient taking differently: Take 2.5 mg by mouth daily with lunch. 01/22/23   [name] [name], MD  aspirin  (ASPIRIN  CHILDRENS) 81 MG chewable tablet Chew 1 tablet (81 mg total) by mouth 2 (two) times daily with a meal. 01/28/24 03/14/24  Hill, Valery RAMAN, PA-C  Biotin w/ Vitamins C & E (HAIR/SKIN/NAILS PO) Take 1 tablet by mouth daily with lunch.    [provider]  Brimonidine  Tartrate (LUMIFY ) 0.025 % SOLN Place 1 drop into both eyes every 4 (four) hours as needed (irritated eyes).    [provider]  Cranberry 400 MG CAPS Take 400 mg by mouth in the morning.    [provider]  docusate sodium  (COLACE) 100 MG capsule Take 1 capsule (100 mg total) by mouth 2 (two) times daily. 01/28/24 02/28/24  Leigh Valery RAMAN, PA-C  hydrALAZINE  (APRESOLINE ) 10 MG tablet TAKE (1) TABLET TWICE A DAY. MAY TAKE EXTRA DOSE FOR SBP >160 UP TO A TOTAL OF 4 TIMES A DAY AS NEEDED Patient taking  differently: Take 10 mg by mouth 4 (four) times daily as needed (SBP >160). 09/09/23   Lavona Agent, MD  isosorbide  mononitrate (IMDUR ) 60 MG 24 hr tablet TAKE 1 & 1/2 TABLETS by mouth once A DAY. Patient taking differently: Take 90 mg by mouth at bedtime. 10/30/23   [name] [name], MD  Lidocaine -Menthol  3.5-7 % PTCH Place 1 patch onto the skin 2 (two) times daily as needed (knee/hip pain.).    [provider]  Multiple Vitamins-Calcium  (ONE-A-DAY WOMENS PO) Take 1 tablet by mouth daily with lunch.    [provider]  nitroGLYCERIN  (NITROSTAT ) 0.4 MG SL tablet DISSOLVE 1 TABLET UNDER TONGUE IF NEEDED FOR CHEST PAIN. MAY REPEAT IN 5 MINUTES FOR 3 DOSES. 03/05/23   Lavona Agent, MD  NON FORMULARY Get steroid inject for SI joint every 12 wks with Dr. Darlis Leash surgery spine    [provider]  ondansetron  (ZOFRAN ) 4 MG tablet Take 1 tablet (4 mg total) by mouth every 8 (eight) hours as needed for nausea or vomiting. 01/28/24 01/27/25  Leigh Valery RAMAN, PA-C  pantoprazole  (PROTONIX ) 40 MG tablet Take 1 tablet twice a day for 30 days, then decrease to 1 tablet daily. Patient taking differently: Take 40 mg by mouth daily. 11/23/23   [name] [name], MD  Polyethyl Glycol-Propyl Glycol (SYSTANE) 0.4-0.3 % GEL ophthalmic gel Place 1 Application into both eyes every 4 (four)  hours as needed (dry eyes).    [provider]  polyethylene glycol powder (MIRALAX ) 17 GM/SCOOP powder Take 17 g dissolved in liquid by mouth daily as needed for mild constipation or moderate constipation. 01/28/24 02/27/24  Leigh Valery RAMAN, PA-C  promethazine  (PHENERGAN ) 12.5 MG tablet Take 1 tablet (12.5 mg total) by mouth every 6 (six) hours as needed for refractory nausea / vomiting. To use if persistent nausea/vomiting despite zofran  01/29/24   Patti Rosina SAUNDERS, PA-C  rosuvastatin  (CRESTOR ) 10 MG tablet TABLET ONE-HALF TABLET BY MOUTH DAILY Patient taking differently: Take 5 mg by mouth daily  with lunch. 10/22/23   Lavona Agent, MD  senna (SENOKOT) 8.6 MG TABS tablet Take 2 tablets (17.2 mg total) by mouth at bedtime for 15 days. 01/28/24 02/13/24  Leigh Valery RAMAN, PA-C  SYNTHROID  50 MCG tablet TAKE 1 TABLET EACH DAY. NEED APPOINTMENT FOR ADDITIONAL REFILLS 01/19/24   [name] HERO, MD     Allergies    Acyclovir and related, Ciprofloxacin , Pravachol  [pravastatin  sodium], Sulfa  antibiotics, and Zocor  [simvastatin ]   Review of Systems   Review of Systems Please see HPI for pertinent positives and negatives  Physical Exam BP 139/76 (BP Location: Right Arm)   Pulse 77   Temp 98.5 F (36.9 C)   Resp 17   Ht 5' 3 (1.6 m)   Wt 56 kg   SpO2 97%   BMI 21.87 kg/m   Physical Exam Vitals and nursing note reviewed.  Constitutional:      Appearance: Normal appearance.  HENT:     Head: Normocephalic and atraumatic.     Nose: Nose normal.     Mouth/Throat:     Mouth: Mucous membranes are moist.  Eyes:     Extraocular Movements: Extraocular movements intact.     Conjunctiva/sclera: Conjunctivae normal.  Cardiovascular:     Rate and Rhythm: Normal rate.  Pulmonary:     Effort: Pulmonary effort is normal.     Breath sounds: Normal breath sounds.  Abdominal:     General: Abdomen is flat.     Palpations: Abdomen is soft.     Tenderness: There is generalized abdominal tenderness. There is no guarding. Negative signs include Murphy's sign and McBurney's sign.  Musculoskeletal:        General: No swelling. Normal range of motion.     Cervical back: Neck supple.  Skin:    General: Skin is warm and dry.  Neurological:     General: No focal deficit present.     Mental Status: She is alert.  Psychiatric:        Mood and Affect: Mood normal.     ED Results / Procedures / Treatments   EKG None  Procedures Procedures  Medications Ordered in the ED Medications  fentaNYL  (SUBLIMAZE ) injection 50 mcg (has no administration in time range)  prochlorperazine   (COMPAZINE ) injection 10 mg (has no administration in time range)  lactated ringers  bolus 1,000 mL (has no administration in time range)    Initial Impression and Plan  Patient here for abdominal pain, N/V/D after recent hip surgery. Some diffuse tenderness, but no peritoneal signs. She had labs done in triage with CBC showing mild anemia, improved from recent admission. CMP with hyponatremia and hypochloremia, likely from GI loses. INR was normal. UA added. Will check CT. Pain/nausea meds and IVF for comfort.   ED Course       MDM Rules/Calculators/A&P Medical Decision Making Amount and/or Complexity of Data Reviewed Labs: ordered. Radiology:  ordered.  Risk Prescription drug management.     Final Clinical Impression(s) / ED Diagnoses Final diagnoses:  None    Rx / DC Orders ED Discharge Orders     None

## 2024-02-07 NOTE — ED Triage Notes (Addendum)
 Pt states she had hip replacement 10 days ago and has had diarrhea past 2 days uncontrollably. +N/V Has taken Imodium and pepto today and states her stools are tarry black.  Pt appears very pale in triage

## 2024-02-07 NOTE — ED Notes (Signed)
 Pt recently had hip sx, has felt fatigued and has been experiencing N/V/D since Sat - worse today.  Also reports passing dark tarry stool Appears pale

## 2024-02-07 NOTE — Telephone Encounter (Signed)
    FYI Only or Action Required?: Action required by provider: clinical question for provider.  Patient was last seen in primary care on 11/30/2023 by Micheal Wolm ORN, MD. Called Nurse Triage reporting Diarrhea. Symptoms began x 3 days. Interventions attempted: Nothing. Symptoms are: gradually worsening.  Triage Disposition: No disposition on file.  Patient/caregiver understands and will follow disposition?: Copied from CRM [id number]. Topic: Clinical - Red Word Triage >> Feb 07, 2024 11:04 AM Kaitlyn Parks wrote: [facility] that prompted transfer to Nurse Triage: is having really bad stomach pains and diarrhea wanted to know what she could take for that Reason for Disposition  MILD-MODERATE diarrhea (e.g., 1-6 times / day more than normal)  Answer Assessment - Initial Assessment Questions 1. DIARRHEA SEVERITY: How bad is the diarrhea? How many more stools have you had in the past 24 hours than normal?    - NO DIARRHEA (SCALE 0)   - MILD (SCALE 1-3): Few loose or mushy BMs; increase of 1-3 stools over normal daily number of stools; mild increase in ostomy output.   -  MODERATE (SCALE 4-7): Increase of 4-6 stools daily over normal; moderate increase in ostomy output.   -  SEVERE (SCALE 8-10; OR WORST POSSIBLE): Increase of 7 or more stools daily over normal; moderate increase in ostomy output; incontinence.      moderate 2. ONSET: When did the diarrhea begin?      X 3 days 3. BM CONSISTENCY: How loose or watery is the diarrhea?      Watery , stool 4. VOMITING: Are you also vomiting? If Yes, ask: How many times in the past 24 hours?  nausea 5. ABDOMEN PAIN: Are you having any abdomen pain? If Yes, ask: What does it feel like? (e.g., crampy, dull, intermittent, constant)      intermittent 6. ABDOMEN PAIN SEVERITY: If present, ask: How bad is the pain?  (e.g., Scale 1-10; mild, moderate, or severe)   - MILD (1-3): doesn't interfere with normal activities, abdomen soft and not  tender to touch    - MODERATE (4-7): interferes with normal activities or awakens from sleep, abdomen tender to touch    - SEVERE (8-10): excruciating pain, doubled over, unable to do any normal activities       moderate 7. ORAL INTAKE: If vomiting, Have you been able to drink liquids? How much liquids have you had in the past 24 hours?     yes 8. HYDRATION: Any signs of dehydration? (e.g., dry mouth [not just dry lips], too weak to stand, dizziness, new weight loss) When did you last urinate?     no 9. EXPOSURE: Have you traveled to a foreign country recently? Have you been exposed to anyone with diarrhea? Could you have eaten any food that was spoiled?     na 10. ANTIBIOTIC USE: Are you taking antibiotics now or have you taken antibiotics in the past 2 months?       na 11. OTHER SYMPTOMS: Do you have any other symptoms? (e.g., fever, blood in stool)       no 12. PREGNANCY: Is there any chance you are pregnant? When was your last menstrual period?       N/a  Patient would like some suggestions about medications to take to help r/t s/s:  Protocols used: [facility]

## 2024-02-07 NOTE — ED Notes (Signed)
 Patient transported to CT

## 2024-02-08 ENCOUNTER — Other Ambulatory Visit: Payer: Self-pay

## 2024-02-08 DIAGNOSIS — I2510 Atherosclerotic heart disease of native coronary artery without angina pectoris: Secondary | ICD-10-CM | POA: Diagnosis not present

## 2024-02-08 DIAGNOSIS — R197 Diarrhea, unspecified: Secondary | ICD-10-CM

## 2024-02-08 DIAGNOSIS — E871 Hypo-osmolality and hyponatremia: Secondary | ICD-10-CM | POA: Diagnosis not present

## 2024-02-08 DIAGNOSIS — D649 Anemia, unspecified: Secondary | ICD-10-CM | POA: Diagnosis not present

## 2024-02-08 DIAGNOSIS — K219 Gastro-esophageal reflux disease without esophagitis: Secondary | ICD-10-CM | POA: Diagnosis not present

## 2024-02-08 DIAGNOSIS — I10 Essential (primary) hypertension: Secondary | ICD-10-CM | POA: Diagnosis not present

## 2024-02-08 DIAGNOSIS — E876 Hypokalemia: Secondary | ICD-10-CM | POA: Diagnosis not present

## 2024-02-08 DIAGNOSIS — Z96642 Presence of left artificial hip joint: Secondary | ICD-10-CM | POA: Diagnosis not present

## 2024-02-08 DIAGNOSIS — Z66 Do not resuscitate: Secondary | ICD-10-CM | POA: Diagnosis not present

## 2024-02-08 DIAGNOSIS — R112 Nausea with vomiting, unspecified: Secondary | ICD-10-CM | POA: Diagnosis present

## 2024-02-08 DIAGNOSIS — E785 Hyperlipidemia, unspecified: Secondary | ICD-10-CM | POA: Diagnosis not present

## 2024-02-08 DIAGNOSIS — R1110 Vomiting, unspecified: Secondary | ICD-10-CM | POA: Diagnosis not present

## 2024-02-08 DIAGNOSIS — D696 Thrombocytopenia, unspecified: Secondary | ICD-10-CM | POA: Diagnosis not present

## 2024-02-08 DIAGNOSIS — Z7982 Long term (current) use of aspirin: Secondary | ICD-10-CM | POA: Diagnosis not present

## 2024-02-08 LAB — URINALYSIS, W/ REFLEX TO CULTURE (INFECTION SUSPECTED)
Bacteria, UA: NONE SEEN
Bilirubin Urine: NEGATIVE
Glucose, UA: NEGATIVE mg/dL
Hgb urine dipstick: NEGATIVE
Ketones, ur: NEGATIVE mg/dL
Leukocytes,Ua: NEGATIVE
Nitrite: NEGATIVE
Protein, ur: NEGATIVE mg/dL
Specific Gravity, Urine: 1.009 (ref 1.005–1.030)
pH: 6 (ref 5.0–8.0)

## 2024-02-08 LAB — BASIC METABOLIC PANEL WITH GFR
Anion gap: 12 (ref 5–15)
BUN: 9 mg/dL (ref 8–23)
CO2: 26 mmol/L (ref 22–32)
Calcium: 9 mg/dL (ref 8.9–10.3)
Chloride: 102 mmol/L (ref 98–111)
Creatinine, Ser: 0.39 mg/dL — ABNORMAL LOW (ref 0.44–1.00)
GFR, Estimated: >60 mL/min (ref >60)
Glucose, Bld: 104 mg/dL — ABNORMAL HIGH (ref 70–99)
Potassium: 3.4 mmol/L — ABNORMAL LOW (ref 3.5–5.1)
Sodium: 140 mmol/L (ref 135–145)

## 2024-02-08 LAB — CBC WITH DIFFERENTIAL/PLATELET
Abs Immature Granulocytes: 0.05 K/uL (ref 0.00–0.07)
Basophils Absolute: 0 K/uL (ref 0.0–0.1)
Basophils Relative: 1 %
Eosinophils Absolute: 0.1 K/uL (ref 0.0–0.5)
Eosinophils Relative: 2 %
HCT: 32.7 % — ABNORMAL LOW (ref 36.0–46.0)
Hemoglobin: 10.8 g/dL — ABNORMAL LOW (ref 12.0–15.0)
Immature Granulocytes: 1 %
Lymphocytes Relative: 11 %
Lymphs Abs: 0.7 K/uL (ref 0.7–4.0)
MCH: 30.8 pg (ref 26.0–34.0)
MCHC: 33 g/dL (ref 30.0–36.0)
MCV: 93.2 fL (ref 80.0–100.0)
Monocytes Absolute: 0.7 K/uL (ref 0.1–1.0)
Monocytes Relative: 11 %
Neutro Abs: 4.6 K/uL (ref 1.7–7.7)
Neutrophils Relative %: 74 %
Platelets: 460 K/uL — ABNORMAL HIGH (ref 150–400)
RBC: 3.51 MIL/uL — ABNORMAL LOW (ref 3.87–5.11)
RDW: 12.5 % (ref 11.5–15.5)
WBC: 6.1 K/uL (ref 4.0–10.5)
nRBC: 0 % (ref 0.0–0.2)

## 2024-02-08 MED ORDER — TRAMADOL HCL 50 MG PO TABS: 50.0000 mg | ORAL_TABLET | Freq: Four times a day (QID) | ORAL | Status: DC | PRN

## 2024-02-08 MED ORDER — ORAL CARE MOUTH RINSE
15.0000 mL | OROMUCOSAL | Status: DC | PRN
Start: 2024-02-08 — End: 2024-02-09

## 2024-02-08 MED ORDER — ONDANSETRON HCL 4 MG/2ML IJ SOLN
4.0000 mg | Freq: Four times a day (QID) | INTRAMUSCULAR | Status: DC | PRN
  Administered 2024-02-08: 4 mg via INTRAVENOUS
  Filled 2024-02-08: qty 2

## 2024-02-08 MED ORDER — ISOSORBIDE MONONITRATE ER 60 MG PO TB24
90.0000 mg | ORAL_TABLET | Freq: Every day | ORAL | Status: DC
  Administered 2024-02-08: 90 mg via ORAL
  Filled 2024-02-08: qty 1

## 2024-02-08 MED ORDER — ROSUVASTATIN CALCIUM 5 MG PO TABS
5.0000 mg | ORAL_TABLET | Freq: Every day | ORAL | Status: DC
  Administered 2024-02-08 – 2024-02-09 (×2): 5 mg via ORAL
  Filled 2024-02-08 (×2): qty 1

## 2024-02-08 MED ORDER — ARTIFICIAL TEARS OPHTHALMIC OINT: 1.0000 | TOPICAL_OINTMENT | OPHTHALMIC | Status: DC | PRN

## 2024-02-08 MED ORDER — ACETAMINOPHEN 650 MG RE SUPP
650.0000 mg | Freq: Four times a day (QID) | RECTAL | Status: DC | PRN
Start: 2024-02-08 — End: 2024-02-09

## 2024-02-08 MED ORDER — ASPIRIN 81 MG PO CHEW
81.0000 mg | CHEWABLE_TABLET | Freq: Two times a day (BID) | ORAL | Status: DC
  Administered 2024-02-08 – 2024-02-09 (×2): 81 mg via ORAL
  Filled 2024-02-08 (×2): qty 1

## 2024-02-08 MED ORDER — POLYETHYLENE GLYCOL 3350 17 G PO PACK: 17.0000 g | PACK | Freq: Every day | ORAL | Status: DC | PRN

## 2024-02-08 MED ORDER — MORPHINE SULFATE (PF) 4 MG/ML IV SOLN
4.0000 mg | Freq: Once | INTRAVENOUS | Status: AC
  Administered 2024-02-08: 4 mg via INTRAVENOUS
  Filled 2024-02-08: qty 1

## 2024-02-08 MED ORDER — MORPHINE SULFATE (PF) 2 MG/ML IV SOLN: 2.0000 mg | INTRAVENOUS | Status: DC | PRN

## 2024-02-08 MED ORDER — AMLODIPINE BESYLATE 5 MG PO TABS
2.5000 mg | ORAL_TABLET | Freq: Every day | ORAL | Status: DC
  Administered 2024-02-08 – 2024-02-09 (×2): 2.5 mg via ORAL
  Filled 2024-02-08 (×2): qty 1

## 2024-02-08 MED ORDER — HYDRALAZINE HCL 10 MG PO TABS: 10.0000 mg | ORAL_TABLET | Freq: Four times a day (QID) | ORAL | Status: DC | PRN

## 2024-02-08 MED ORDER — LACTATED RINGERS IV SOLN: INTRAVENOUS | Status: AC

## 2024-02-08 MED ORDER — ACETAMINOPHEN 325 MG PO TABS: 650.0000 mg | ORAL_TABLET | Freq: Four times a day (QID) | ORAL | Status: DC | PRN

## 2024-02-08 MED ORDER — ONDANSETRON HCL 4 MG PO TABS
4.0000 mg | ORAL_TABLET | Freq: Four times a day (QID) | ORAL | Status: DC | PRN
Start: 2024-02-08 — End: 2024-02-09

## 2024-02-08 MED ORDER — PANTOPRAZOLE SODIUM 40 MG PO TBEC
40.0000 mg | DELAYED_RELEASE_TABLET | Freq: Every day | ORAL | Status: DC
  Administered 2024-02-08 – 2024-02-09 (×2): 40 mg via ORAL
  Filled 2024-02-08 (×2): qty 1

## 2024-02-08 MED ORDER — LEVOTHYROXINE SODIUM 50 MCG PO TABS
50.0000 ug | ORAL_TABLET | Freq: Every day | ORAL | Status: DC
  Administered 2024-02-08 – 2024-02-09 (×2): 50 ug via ORAL
  Filled 2024-02-08 (×2): qty 1

## 2024-02-08 MED ORDER — LIDOCAINE 5 % EX PTCH: 1.0000 | MEDICATED_PATCH | Freq: Every day | CUTANEOUS | Status: DC | PRN

## 2024-02-08 MED ORDER — OXYCODONE HCL 5 MG PO TABS: 5.0000 mg | ORAL_TABLET | ORAL | Status: DC | PRN

## 2024-02-08 MED ORDER — DOCUSATE SODIUM 100 MG PO CAPS
100.0000 mg | ORAL_CAPSULE | Freq: Two times a day (BID) | ORAL | Status: DC
  Administered 2024-02-08 – 2024-02-09 (×3): 100 mg via ORAL
  Filled 2024-02-08 (×3): qty 1

## 2024-02-08 MED ORDER — ENOXAPARIN SODIUM 40 MG/0.4ML IJ SOSY
40.0000 mg | PREFILLED_SYRINGE | INTRAMUSCULAR | Status: DC
  Administered 2024-02-08: 40 mg via SUBCUTANEOUS
  Filled 2024-02-08: qty 0.4

## 2024-02-08 MED ORDER — SODIUM CHLORIDE 0.9% FLUSH: 3.0000 mL | Freq: Two times a day (BID) | INTRAVENOUS | Status: DC

## 2024-02-08 NOTE — Telephone Encounter (Signed)
 Pt was admitted for hyponatremia, ok to close

## 2024-02-08 NOTE — Hospital Course (Signed)
 15bn with h/o CAD, hypothyroidism, HTN, HLD, hyponatremia, and recent (6/26) total hip arthroplasty who presented on 7/7 with n/v/d.  CT unremarkable.

## 2024-02-08 NOTE — Telephone Encounter (Signed)
 Pt admitted, ok to close

## 2024-02-08 NOTE — ED Notes (Addendum)
 Pt anxious pt had hip replacement 10 days ago and is having increased pain getting up and is very weak to [facility].  Pure-wic placed

## 2024-02-08 NOTE — ED Notes (Signed)
 Pt back from CT, bedside commode at bedside, pt unsteady on her feet and weak

## 2024-02-08 NOTE — Plan of Care (Signed)
 Plan of Care Note for accepted transfer   Patient name: Kaitlyn Parks FMW:[id number] DOB: 07/08/1935  Facility requesting transfer: [facility] ED Requesting Provider: Dr. Roselyn Facility course: 89 year old female with history of CAD, hypothyroidism, hypertension, hyperlipidemia, GERD, hyponatremia, recent left total hip arthroplasty on 6/26 presenting with complaints of nausea, vomiting, burning generalized abdominal pain, and diarrhea x 2 days.  Reported black stools after taking Pepto-Bismol.  Vital signs stable on arrival to the ED.  Labs showing no leukocytosis, hemoglobin 10.1 (stable), platelet count 421k, sodium 125 (previously 136 on labs 10 days ago), chloride 90, normal LFTs, UA not suggestive of infection.  CT abdomen pelvis showing no acute findings. Patient was given fentanyl , morphine , Compazine , and 1 L IV fluids.  Plan of care: The patient is accepted for admission to Telemetry unit at [facility].  [facility] will assume care on arrival to accepting facility. Until arrival, care as per EDP. However, [facility] available 24/7 for questions and assistance.  Check [email or web address] for on-call coverage.  Nursing staff, please call [facility] Admits & Consults System-Wide number under Amion on patient's arrival so appropriate admitting provider can evaluate the pt.

## 2024-02-08 NOTE — ED Notes (Signed)
 Notified inpatient unit that [facility] is en route for transport.

## 2024-02-08 NOTE — ED Notes (Signed)
 Family updated as to patient's status. Notified of inpatient bed status ready at [facility] [id number] per pt request.

## 2024-02-08 NOTE — Evaluation (Signed)
 Physical Therapy Evaluation Patient Details Name: Kaitlyn Parks MRN: [id number] DOB: March 15, 1935 Today's Date: 02/08/2024  History of Present Illness  Kaitlyn Parks is an 89 yo female presents 02/07/24 with nausea and vomiting. Of note, pt s/p L THA AA 01/27/24. PMH:  CAD, cystocele, hypothyroidism, macular degeneration, NSTEMI, osteoporosis, rectocele, R THA 2018, scoliosis, uterine prolapse, urinary incontinence  Clinical Impression  Pt admitted with above diagnosis. Pt recently admitted for L THA 6/26, returns with nausea vomiting. Since home, pt able to ascend/descend 2 steps from sunroom/bedroom into first level of home with half bath. Pt reports still unable to ascend/descend flight of steps up to bedroom/bathroom. Pt reports daughter is an outpatient PT and has been living with her, assisting her with HEP and gait, but both feel pt most limited by GI symptoms which returned pt to hospital. On eval, pt needing supv with bed mobility, transfers and gait using RW. Pt maintains static L knee flexion in stance, decreased weightbearing and stance time, maintains L foot on forefoot unless constantly cued for heel strike. Pt tolerates standing HEP this date, but limited by pain complaints of burning in urethral area- notified nursing. Educated pt on continued HEP performance and time OOB in recliner or amb with staff while hospitalized and pt verbalizes understanding. Recommend HHPT with continued family support at d/c. Pt currently with functional limitations due to the deficits listed below (see PT Problem List). Pt will benefit from acute skilled PT to increase their independence and safety with mobility to allow discharge.           If plan is discharge home, recommend the following: A little help with walking and/or transfers;A little help with bathing/dressing/bathroom;Assistance with cooking/housework;Assist for transportation;Help with stairs or ramp for entrance   Can travel by private vehicle         Equipment Recommendations None recommended by PT  Recommendations for Other Services       Functional Status Assessment Patient has had a recent decline in their functional status and demonstrates the ability to make significant improvements in function in a reasonable and predictable amount of time.     Precautions / Restrictions Precautions Precautions: Fall Recall of Precautions/Restrictions: Intact Restrictions Weight Bearing Restrictions Per Provider Order: No      Mobility  Bed Mobility Overal bed mobility: Modified Independent             General bed mobility comments: increased time, using bedrail to assist, no verbal cues    Transfers Overall transfer level: Needs assistance Equipment used: Rolling walker (2 wheels) Transfers: Sit to/from Stand Sit to Stand: Supervision           General transfer comment: supv, LLE extended with decreased weightbearing to power up and return to sitting, maintains L knee flexion in stance    Ambulation/Gait Ambulation/Gait assistance: Supervision Gait Distance (Feet): 100 Feet Assistive device: Rolling walker (2 wheels) Gait Pattern/deviations: Step-to pattern, Decreased stance time - left, Decreased weight shift to left, Knee flexed in stance - left Gait velocity: decreased     General Gait Details: step-to gait pattern with decreased LLE stance time, maintains static L knee flexion in stance phase, decreased heel-toe pattern with L foot on forefoot unless cued  Stairs            Wheelchair Mobility     Tilt Bed    Modified Rankin (Stroke Patients Only)       Balance Overall balance assessment: Needs assistance Sitting-balance support: Feet supported Sitting balance-Leahy  Scale: Good     Standing balance support: Reliant on assistive device for balance, During functional activity Standing balance-Leahy Scale: Fair Standing balance comment: static able to release BUE, dynamic with RW                              Pertinent Vitals/Pain Pain Assessment Pain Assessment: Faces Faces Pain Scale: Hurts little more Pain Location: L quad and burning like I have ot pee Pain Descriptors / Indicators: Burning Pain Intervention(s): Limited activity within patient's tolerance, Monitored during session, Repositioned    Home Living Family/patient expects to be discharged to:: Private residence Living Arrangements: Children Available Help at Discharge: Family;Available 24 hours/day Type of Home: House Home Access: Stairs to enter Entrance Stairs-Rails: Right;Left;Can reach both Entrance Stairs-Number of Steps: 3 Alternate Level Stairs-Number of Steps: 2 steps from sunroom to rest of the home; flight of steps up to 2nd Home Layout: Two level;1/2 bath on main level Home Equipment: Agricultural consultant (2 wheels);BSC/3in1 Additional Comments: currently sleeping in sunroom on main level, half bath up 2 steps and has done it ind a couple of times since d/c but mostly with supv    Prior Function Prior Level of Function : Independent/Modified Independent             Mobility Comments: pt reports ind with household amb, not a community ambulator due to pain in hip, knee and back, denies falls ADLs Comments: pt reports ind with self care, has a cleaning lady, daughter does the cooking     Extremity/Trunk Assessment   Upper Extremity Assessment Upper Extremity Assessment: Overall WFL for tasks assessed    Lower Extremity Assessment LLE Deficits / Details: ankle WFL, knee able to perform quad set and LAQ with increased time, hip 3+/5 LLE Sensation: WNL LLE Coordination: WNL       Communication   Communication Communication: No apparent difficulties    Cognition Arousal: Alert Behavior During Therapy: WFL for tasks assessed/performed   PT - Cognitive impairments: No apparent impairments                         Following commands: Intact       Cueing  Cueing Techniques: Verbal cues     General Comments      Exercises Total Joint Exercises Hip ABduction/ADduction: AROM, Both, 5 reps, Standing Knee Flexion: AROM, Both, 5 reps, Standing (HS curl) Marching in Standing: AROM, Both, 5 reps, Standing Standing Hip Extension: AROM, Both, 5 reps, Standing   Assessment/Plan    PT Assessment Patient needs continued PT services  PT Problem List Decreased strength;Decreased range of motion;Decreased activity tolerance;Decreased balance;Decreased mobility;Decreased knowledge of use of DME;Decreased safety awareness;Decreased knowledge of precautions;Pain       PT Treatment Interventions DME instruction;Gait training;Stair training;Functional mobility training;Therapeutic activities;Therapeutic exercise;Patient/family education;Balance training    PT Goals (Current goals can be found in the Care Plan section)  Acute Rehab PT Goals Patient Stated Goal: HHPT, regain strength PT Goal Formulation: With patient Time For Goal Achievement: 02/22/24 Potential to Achieve Goals: Good    Frequency Min 3X/week     Co-evaluation               AM-PAC PT 6 Clicks Mobility  Outcome Measure Help needed turning from your back to your side while in a flat bed without using bedrails?: A Little Help needed moving from lying on your back to sitting on  the side of a flat bed without using bedrails?: A Little Help needed moving to and from a bed to a chair (including a wheelchair)?: A Little Help needed standing up from a chair using your arms (e.g., wheelchair or bedside chair)?: A Little Help needed to walk in hospital room?: A Little Help needed climbing 3-5 steps with a railing? : A Little 6 Click Score: 18    End of Session Equipment Utilized During Treatment: Gait belt Activity Tolerance: Patient tolerated treatment well;Patient limited by pain Patient left: in bed;with call bell/phone within reach;with family/visitor present Nurse  Communication: Mobility status PT Visit Diagnosis: Muscle weakness (generalized) (M62.81);Pain Pain - Right/Left: Left Pain - part of body: Hip    Time: 8675-8653 PT Time Calculation (min) (ACUTE ONLY): 22 min   Charges:   PT Evaluation $PT Eval Low Complexity: 1 Low   PT General Charges $$ ACUTE PT VISIT: 1 Visit         Tori Runell Kovich PT, DPT 02/08/24, 2:02 PM

## 2024-02-08 NOTE — H&P (Signed)
 History and Physical    Patient: Kaitlyn Parks FMW:[id number] DOB: 08/02/1935 DOA: 02/07/2024 DOS: the patient was seen and examined on 02/08/2024 PCP: [name] [name], MD  Patient coming from: Home - lives with daughter some and alone otherwise; NOK: Daughter, Corean Her, [phone]   Chief Complaint: n/v/d  HPI: Kaitlyn Parks is a 89 y.o. female with medical history significant of CAD, hypothyroidism, hypertension, hyperlipidemia, GERD, hyponatremia, recent left total hip arthroplasty on 6/26 presenting with complaints of nausea, vomiting, burning generalized abdominal pain, and diarrhea x 2 days.  She reports that she developed severe abdominal pain.  Symptoms started Saturday.  She felt like it was a build up of new medications prescribed in the hospital.  It got worse with vomiting and diarrhea yesterday with severe cramping.  New medications include hydrocodone , APAP, stool softener, Senna, promethazine , Zofran .  She feels rested and her stomach feels better.  She has not had anything to eat or drink yet.  She previously had abdominal pain from 11/2023 that was thought to be related to gastritis/esophagitis in the setting of chronic NSAID use due to hip pain.  She has been on Protonix  with ?ongoing pain.  She underwent L THR on 6/26 and has been slow to recover, has not yet started therapy.  Family is concerned that she might need placement.  She is actually having more L knee pain now than hip pain.  She was due to see orthopedics tomorrow for f/u.    ER Course:  DB to [facility]:  nausea, vomiting, burning generalized abdominal pain, and diarrhea x 2 days.  Reported black stools after taking Pepto-Bismol.  Vital signs stable on arrival to the ED.  Labs showing no leukocytosis, hemoglobin 10.1 (stable), platelet count 421k, sodium 125 (previously 136 on labs 10 days ago), chloride 90, normal LFTs, UA not suggestive of infection.  CT abdomen pelvis showing no acute findings. Patient was  given fentanyl , morphine , Compazine , and 1 L IV fluids.      Review of Systems: As mentioned in the history of present illness. All other systems reviewed and are negative. Past Medical History:  Diagnosis Date   Arthritis    DDD, scoliosis, sees Dr. Mavis for this, uses norco very rarely for pain   Arthritis    lumbar stenosis, gets steroid injections for [employer]   Bradycardia 11/11/2017   CAD (coronary artery disease)    LAD stenting of a 90% lesion 2012   Cystocele    Educated about COVID-19 virus infection 12/06/2019   Elevated cholesterol    GERD (gastroesophageal reflux disease)    dx in work up 2016 for atypical CP at OSH   pt. denies   Heart murmur    Hypertensive retinopathy    OU   Hypothyroidism    Interstitial cystitis    sees Dr. Watt   Macular degeneration    OU   Neuromuscular disorder [facility])    neuropathy   NSTEMI (non-ST elevated myocardial infarction) (HCC)    Osteoporosis    Rectocele    S/P hip replacement, right 06/12/2017   Scoliosis    Thyroid  disease    Hypothyroid   Urinary incontinence    USI   Uterine prolapse    Past Surgical History:  Procedure Laterality Date   BLADDER SUSPENSION  2011   CATARACT EXTRACTION Bilateral 2015   Dr. Cleatus   CORONARY ANGIOPLASTY WITH STENT PLACEMENT  04/21/2010   LAD 80%, RCA 30%, nl EF, s/p DES LAD  EYE SURGERY     LEFT HEART CATH AND CORONARY ANGIOGRAPHY N/A 06/14/2017   Procedure: LEFT HEART CATH AND CORONARY ANGIOGRAPHY;  Surgeon: Court Dorn PARAS, MD;  Location: [facility] INVASIVE CV LAB;  Service: Cardiovascular;  Laterality: N/A;   OOPHORECTOMY  2011   BSO   TOTAL HIP ARTHROPLASTY Right 06/10/2017   Procedure: RIGHT TOTAL HIP ARTHROPLASTY ANTERIOR APPROACH;  Surgeon: Fidel Rogue, MD;  Location: [facility] ORS;  Service: Orthopedics;  Laterality: Right;  Needs RNFA   TOTAL HIP ARTHROPLASTY Left 01/27/2024   Procedure: ARTHROPLASTY, HIP, TOTAL, ANTERIOR APPROACH;  Surgeon: Fidel Rogue, MD;   Location: [facility] ORS;  Service: Orthopedics;  Laterality: Left;   VAGINAL HYSTERECTOMY  2011   LAVH BSO; benign   Social History:  reports that she has never smoked. She has never used smokeless tobacco. She reports that she does not drink alcohol  and does not use drugs.  Allergies  Allergen Reactions   Acyclovir And Related Other (See Comments)    unknown   Ciprofloxacin  Other (See Comments)    dizziness   Pravachol  [Pravastatin  Sodium] Other (See Comments)    cystitis   Sulfa  Antibiotics     nausea   Zocor  [Simvastatin ] Other (See Comments)    cystitis    Family History  Problem Relation Age of Onset   Hypertension Mother    Heart disease Mother    Heart disease Father    Heart disease Sister    Diabetes Sister    Uterine cancer Sister        mets to lungs   Lung cancer Sister    Scoliosis Sister    Heart disease Brother    Colon cancer Paternal Aunt 30   Breast cancer Paternal Aunt        Age 14's   Breast cancer Paternal Aunt    Leukemia Paternal Aunt    Lung cancer Paternal Grandfather        smoker   Arthritis Daughter    Breast cancer Cousin        Maternal 1st cousins-Age 66's   Esophageal cancer Neg Hx    Pancreatic cancer Neg Hx    Stomach cancer Neg Hx     Prior to Admission medications   Medication Sig Start Date End Date Taking? Authorizing Provider  Aflibercept  (EYLEA  IZ) by Intravitreal route See admin instructions. Every 7 weeks    [provider]  amLODipine  (NORVASC ) 2.5 MG tablet Take 1 tablet (2.5 mg total) by mouth daily. 01/22/23   [name] HERO, MD  aspirin  (ASPIRIN  CHILDRENS) 81 MG chewable tablet Chew 1 tablet (81 mg total) by mouth 2 (two) times daily with a meal. 01/28/24 03/14/24  Hill, Valery RAMAN, PA-C  Biotin w/ Vitamins C & E (HAIR/SKIN/NAILS PO) Take 1 tablet by mouth daily with lunch.    [provider]  Brimonidine  Tartrate (LUMIFY ) 0.025 % SOLN Place 1 drop into both eyes every 4 (four) hours as needed (irritated  eyes).    [provider]  Cranberry 400 MG CAPS Take 400 mg by mouth in the morning.    [provider]  docusate sodium  (COLACE) 100 MG capsule Take 1 capsule (100 mg total) by mouth 2 (two) times daily. 01/28/24 02/28/24  Leigh Valery RAMAN, PA-C  hydrALAZINE  (APRESOLINE ) 10 MG tablet TAKE (1) TABLET TWICE A DAY. MAY TAKE EXTRA DOSE FOR SBP >160 UP TO A TOTAL OF 4 TIMES A DAY AS NEEDED Patient taking differently: Take 10 mg by mouth 4 (  four) times daily as needed (SBP >160). 09/09/23   Lavona Agent, MD  isosorbide  mononitrate (IMDUR ) 60 MG 24 hr tablet TAKE 1 & 1/2 TABLETS by mouth once A DAY. Patient taking differently: Take 90 mg by mouth at bedtime. 10/30/23   [name] HERO, MD  Lidocaine -Menthol  3.5-7 % PTCH Place 1 patch onto the skin 2 (two) times daily as needed (knee/hip pain.).    [provider]  Multiple Vitamins-Calcium  (ONE-A-DAY WOMENS PO) Take 1 tablet by mouth daily with lunch.    [provider]  nitroGLYCERIN  (NITROSTAT ) 0.4 MG SL tablet DISSOLVE 1 TABLET UNDER TONGUE IF NEEDED FOR CHEST PAIN. MAY REPEAT IN 5 MINUTES FOR 3 DOSES. 03/05/23   Lavona Agent, MD  NON FORMULARY Get steroid inject for SI joint every 12 wks with Dr. Darlis Leash surgery spine    [provider]  ondansetron  (ZOFRAN ) 4 MG tablet Take 1 tablet (4 mg total) by mouth every 8 (eight) hours as needed for nausea or vomiting. 01/28/24 01/27/25  Leigh Valery RAMAN, PA-C  pantoprazole  (PROTONIX ) 40 MG tablet Take 1 tablet twice a day for 30 days, then decrease to 1 tablet daily. Patient taking differently: Take 40 mg by mouth daily. 11/23/23   [name] HERO, MD  Polyethyl Glycol-Propyl Glycol (SYSTANE) 0.4-0.3 % GEL ophthalmic gel Place 1 Application into both eyes every 4 (four) hours as needed (dry eyes).    [provider]  polyethylene glycol powder (MIRALAX ) 17 GM/SCOOP powder Take 17 g dissolved in liquid by mouth daily as needed for mild constipation or  moderate constipation. 01/28/24 02/27/24  Leigh Valery RAMAN, PA-C  promethazine  (PHENERGAN ) 12.5 MG tablet Take 1 tablet (12.5 mg total) by mouth every 6 (six) hours as needed for refractory nausea / vomiting. To use if persistent nausea/vomiting despite zofran  01/29/24   Patti Rosina SAUNDERS, PA-C  rosuvastatin  (CRESTOR ) 10 MG tablet TABLET ONE-HALF TABLET BY MOUTH DAILY 10/22/23   Lavona Agent, MD  senna (SENOKOT) 8.6 MG TABS tablet Take 2 tablets (17.2 mg total) by mouth at bedtime for 15 days. 01/28/24 02/13/24  Leigh Valery RAMAN, PA-C  SYNTHROID  50 MCG tablet TAKE 1 TABLET EACH DAY. NEED APPOINTMENT FOR ADDITIONAL REFILLS 01/19/24   [name] HERO, MD    Physical Exam: Vitals:   02/08/24 0430 02/08/24 0623 02/08/24 1043 02/08/24 1447  BP: (!) 155/77 (!) 152/75 120/65 (!) 129/57  Pulse: 99 84 87 81  Resp: 18 15 15 17   Temp:  98.8 F (37.1 C) 98.3 F (36.8 C) 98.3 F (36.8 C)  TempSrc:      SpO2: 98% 97% 97% 97%  Weight:      Height:       General:  Appears calm and comfortable and is in NAD, mildly ill-appearing Eyes:   normal lids, iris ENT:  grossly normal hearing, lips & tongue, mildly dry mm Cardiovascular:  RRR. No LE edema.  Respiratory:   CTA bilaterally with no wheezes/rales/rhonchi.  Normal respiratory effort. Abdomen:  soft, mild diffuse lower abdominal TTP without significant epigastric pain, ND Skin:  no rash or induration seen on limited exam; surgical bandage remains in place on L hip Musculoskeletal:  grossly normal tone BUE/BLE, good ROM, no bony abnormality Psychiatric:  blunted mood and affect, speech fluent and appropriate, AOx3 Neurologic:  CN 2-12 grossly intact, moves all extremities in coordinated fashion, sensation intact   Radiological Exams on Admission: Independently reviewed - see discussion in A/P where applicable  CT ABDOMEN PELVIS W CONTRAST  Result Date: 02/08/2024 CLINICAL DATA:  Abdominal pain EXAM: CT ABDOMEN AND PELVIS WITH CONTRAST TECHNIQUE:  Multidetector CT imaging of the abdomen and pelvis was performed using the standard protocol following bolus administration of intravenous contrast. RADIATION DOSE REDUCTION: This exam was performed according to the departmental dose-optimization program which includes automated exposure control, adjustment of the mA and/or kV according to patient size and/or use of iterative reconstruction technique. CONTRAST:  85mL OMNIPAQUE  IOHEXOL  300 MG/ML  SOLN COMPARISON:  11/09/2023 FINDINGS: Lower chest: Linear bibasilar scarring or atelectasis. No effusions. Hepatobiliary: No focal hepatic abnormality. Gallbladder unremarkable. Pancreas: No focal abnormality or ductal dilatation. Spleen: No focal abnormality.  Normal size. Adrenals/Urinary Tract: No adrenal abnormality. No focal renal abnormality. No stones or hydronephrosis. Urinary bladder is unremarkable. Stomach/Bowel: Normal appendix. Stomach, large and small bowel grossly unremarkable. Vascular/Lymphatic: Aortic atherosclerosis. No evidence of aneurysm or adenopathy. Reproductive: Prior hysterectomy.  No adnexal masses. Other: No free fluid or free air. Musculoskeletal: Bilateral hip replacements. Degenerative disc and facet disease throughout the lumbar spine. No acute bony abnormality. IMPRESSION: No acute findings in the abdomen or pelvis. Aortic atherosclerosis. Electronically Signed   By: Franky Crease M.D.   On: 02/08/2024 00:11    EKG: Independently reviewed.  NSR with rate 82; prolonged QTc 504; nonspecific ST changes with no evidence of acute ischemia   Labs on Admission: I have personally reviewed the available labs and imaging studies at the time of the admission.  Pertinent labs:     Na++ 125 Glucose 107 WBC 8.4 Hgb 10.1 Platelets 421 UA unremarkable   Assessment and Plan: Principal Problem:   Vomiting and diarrhea Active Problems:   Hypothyroidism (acquired)   Hypertension   GERD (gastroesophageal reflux disease)   Dyslipidemia    Coronary artery disease involving native coronary artery of native heart without angina pectoris   Abdominal pain   Hyponatremia   S/P total left hip arthroplasty    Abdominal pain with N/V/D Seen by PCP in 11/2023 for presumed PUD due to excessive NSAID use CT for f/u pancreatic cyst 2 weeks prior was unremarkable She was started on PPI and advised to avoid NSAIDs and use Tylenol  prn pain She called back to PCP on 5/12 with c/o constipation despite Miralax  and was recommended to add stool softeners On presentation, she was having severe abdominal pain with n/v/d Patient and daughter attribute this to overzealous bowel regimen (Colace, Senna, Miralax ) Symptoms appear to have resolved Will observe overnight Advance diet from clears to regular as tolerated  Recent THR L THR on 6/26 She reports being discharged to home with home therapy exercises Rehab has not gone particularly well Will request PT/OT consults TOC consult for HH vs. SNF placement On BID ASA prophylaxis  Hyponatremia Likely hypovolemic hyponatremia Resolved after IVF  CAD Continue Imdur , ASA No current c/o CP  Hypothyroidism Continue Synthroid   HTN Continue amlodipine , hydralazine   Hyperlipidemia Continue rosuvastatin   GERD Continue pantoprazole   DNR I have discussed code status with the patient/family and they are in agreement that the patient would not desire resuscitation and would prefer to die a natural death should that situation arise. [facility]yn[facility] documents reviewed      Advance Care Planning:   Code Status: Limited: Do not attempt resuscitation (DNR) -DNR-LIMITED -Do Not Intubate/DNI    Consults: PT/OT; TOC team  DVT Prophylaxis: Lovenox   Family Communication: Daughter was present throughout evaluation  Severity of Illness: The appropriate patient status for this patient is OBSERVATION. Observation status is judged to be  reasonable and necessary in order to provide the required intensity of  service to ensure the patient's safety. The patient's presenting symptoms, physical exam findings, and initial radiographic and laboratory data in the context of their medical condition is felt to place them at decreased risk for further clinical deterioration. Furthermore, it is anticipated that the patient will be medically stable for discharge from the hospital within 2 midnights of admission.   Author: Delon Herald, MD 02/08/2024 3:27 PM  For on call review [email or web address].

## 2024-02-09 DIAGNOSIS — R1110 Vomiting, unspecified: Secondary | ICD-10-CM | POA: Diagnosis not present

## 2024-02-09 DIAGNOSIS — R197 Diarrhea, unspecified: Secondary | ICD-10-CM | POA: Diagnosis not present

## 2024-02-09 LAB — CBC
HCT: 33.2 % — ABNORMAL LOW (ref 36.0–46.0)
Hemoglobin: 10.5 g/dL — ABNORMAL LOW (ref 12.0–15.0)
MCH: 30.5 pg (ref 26.0–34.0)
MCHC: 31.6 g/dL (ref 30.0–36.0)
MCV: 96.5 fL (ref 80.0–100.0)
Platelets: 495 K/uL — ABNORMAL HIGH (ref 150–400)
RBC: 3.44 MIL/uL — ABNORMAL LOW (ref 3.87–5.11)
RDW: 12.7 % (ref 11.5–15.5)
WBC: 7.7 K/uL (ref 4.0–10.5)
nRBC: 0 % (ref 0.0–0.2)

## 2024-02-09 LAB — BASIC METABOLIC PANEL WITH GFR
Anion gap: 8 (ref 5–15)
BUN: 14 mg/dL (ref 8–23)
CO2: 28 mmol/L (ref 22–32)
Calcium: 8.7 mg/dL — ABNORMAL LOW (ref 8.9–10.3)
Chloride: 99 mmol/L (ref 98–111)
Creatinine, Ser: 0.68 mg/dL (ref 0.44–1.00)
GFR, Estimated: >60 mL/min (ref >60)
Glucose, Bld: 103 mg/dL — ABNORMAL HIGH (ref 70–99)
Potassium: 3.7 mmol/L (ref 3.5–5.1)
Sodium: 135 mmol/L (ref 135–145)

## 2024-02-09 MED ORDER — SENNOSIDES-DOCUSATE SODIUM 8.6-50 MG PO TABS: 1.0000 | ORAL_TABLET | Freq: Two times a day (BID) | ORAL | Status: DC

## 2024-02-09 MED ORDER — PANTOPRAZOLE SODIUM 40 MG PO TBEC: 40.0000 mg | DELAYED_RELEASE_TABLET | Freq: Two times a day (BID) | ORAL | Status: DC

## 2024-02-09 MED ORDER — BISACODYL 10 MG RE SUPP: 10.0000 mg | Freq: Every day | RECTAL | 0 refills | Status: DC | PRN

## 2024-02-09 MED ORDER — PANTOPRAZOLE SODIUM 40 MG PO TBEC: 40.0000 mg | DELAYED_RELEASE_TABLET | Freq: Two times a day (BID) | ORAL | 0 refills | Status: DC

## 2024-02-09 MED ORDER — POLYETHYLENE GLYCOL 3350 17 G PO PACK: 17.0000 g | PACK | Freq: Two times a day (BID) | ORAL | Status: DC

## 2024-02-09 MED ORDER — BISACODYL 10 MG RE SUPP: 10.0000 mg | Freq: Every day | RECTAL | Status: DC | PRN

## 2024-02-09 MED ORDER — ONDANSETRON HCL 4 MG PO TABS: 4.0000 mg | ORAL_TABLET | Freq: Four times a day (QID) | ORAL | 0 refills | Status: DC | PRN

## 2024-02-09 MED ORDER — POLYETHYLENE GLYCOL 3350 17 G PO PACK
17.0000 g | PACK | Freq: Two times a day (BID) | ORAL | Status: DC
  Administered 2024-02-09: 17 g via ORAL
  Filled 2024-02-09: qty 1

## 2024-02-09 NOTE — Care Management Obs Status (Signed)
 MEDICARE OBSERVATION STATUS NOTIFICATION   Patient Details  Name: Kaitlyn Parks MRN: [id number] Date of Birth: March 12, 1935   Medicare Observation Status Notification Given:  Yes    Sheri ONEIDA Sharps, LCSW 02/09/2024, 1:57 PM

## 2024-02-09 NOTE — Progress Notes (Signed)
   02/09/24 1411  TOC Brief Assessment  Insurance and Status Reviewed  Patient has primary care physician Yes  Home environment has been reviewed home  Prior level of function: independent  Prior/Current Home Services No current home services  Social Drivers of Health Review SDOH reviewed no interventions necessary  Readmission risk has been reviewed Yes  Transition of care needs no transition of care needs at this time

## 2024-02-09 NOTE — Plan of Care (Signed)

## 2024-02-09 NOTE — Discharge Summary (Signed)
 Physician Discharge Summary  Kaitlyn Parks FMW:[id number] DOB: September 30, 1934 DOA: 02/07/2024  PCP: Kaitlyn Heron [name], MD  Admit date: 02/07/2024 Discharge date: 02/09/2024  Admitted From: Home Disposition: Home  Recommendations for Outpatient Follow-up:  Follow up with PCP in 1 week with repeat CBC/BMP Outpatient follow-up with orthopedics at earliest convenience Recommend outpatient evaluation and follow-up by GI Follow up in ED if symptoms worsen or new appear   Home Health: No Equipment/Devices: None  Discharge Condition: Stable CODE STATUS: DNR diet recommendation: Heart healthy  Brief/Interim Summary: 89 y.o. female with medical history significant of CAD, hypothyroidism, hypertension, hyperlipidemia, GERD, hyponatremia, recent left total hip arthroplasty on 01/27/24 presented with abdominal pain, nausea, vomiting.  Presentation, hemoglobin was stable, sodium of 125, UA was not suggestive of infection.  CT of abdomen and pelvis showed no acute findings.  She was treated with IV fluids.  During hospitalization, her condition has improved.  PT recommended home health PT.  She feels okay to go home today.  She will be discharged home today on scheduled laxatives along with Protonix  twice a day.  Recommend outpatient evaluation and follow-up by GI.  Discharge Diagnoses:   Abdominal pain with nausea, vomiting GERD - Imaging as above.  Symptoms possibly from constipation.  Advised patient to be on scheduled laxatives: MiraLAX  1-2 times a day along with Senokot twice a day and as needed Dulcolax suppository. - Continue Protonix  twice a day for now.  Outpatient follow-up with GI if symptoms do not improve. - Currently symptoms have improved.  Tolerating diet. - Patient feels okay to go home today.  Discharge patient home today  Recent left THR on 01/27/2024 - PT recommended home health PT.  Outpatient follow-up with orthopedics at earliest convenience.  Continue aspirin  twice daily for  prophylaxis as per orthopedics recommendations  Hyponatremia -Possibly hypovolemic.  Sodium 125 on presentation.  Improved with IV fluids.  Outpatient follow-up  Hypokalemia Resolved  Thrombocytosis - Possibly reactive.  Outpatient follow-up  Anemia of chronic disease - From chronic illnesses.  Hemoglobin stable.  Outpatient follow-up  CAD - Continue Imdur  and aspirin .  Outpatient follow-up  Hypothyroidism - Continue Synthroid   Hypertension Hyperlipidemia -Continue home regimen  Discharge Instructions  Discharge Instructions     Ambulatory referral to Gastroenterology   Complete by: As directed    Hospital followup   What is the reason for referral?: Other      Allergies as of 02/09/2024       Reactions   Acyclovir And Related Other (See Comments)   unknown   Ciprofloxacin  Other (See Comments)   dizziness   Pravachol  [pravastatin  Sodium] Other (See Comments)   cystitis   Sulfa  Antibiotics    nausea   Zocor  [simvastatin ] Other (See Comments)   cystitis        Medication List     STOP taking these medications    docusate sodium  100 MG capsule Commonly known as: Colace   polyethylene glycol powder 17 GM/SCOOP powder Commonly known as: MiraLax  Replaced by: polyethylene glycol 17 g packet       TAKE these medications    amLODipine  2.5 MG tablet Commonly known as: NORVASC  Take 1 tablet (2.5 mg total) by mouth daily.   Aspirin  Low Dose 81 MG chewable tablet Generic drug: aspirin  Chew 1 tablet (81 mg total) by mouth 2 (two) times daily with a meal.   bisacodyl  10 MG suppository Commonly known as: DULCOLAX Place 1 suppository (10 mg total) rectally daily as needed for severe constipation.  Cranberry 400 MG Caps Take 400 mg by mouth in the morning.   EYLEA  IZ 1 Dose by Intravitreal route See admin instructions. Every 7 weeks   HAIR/SKIN/NAILS PO Take 1 tablet by mouth daily with lunch.   hydrALAZINE  10 MG tablet Commonly known as:  APRESOLINE  TAKE (1) TABLET TWICE A DAY. MAY TAKE EXTRA DOSE FOR SBP >160 UP TO A TOTAL OF 4 TIMES A DAY AS NEEDED What changed:  how much to take how to take this when to take this reasons to take this additional instructions   isosorbide  mononitrate 60 MG 24 hr tablet Commonly known as: IMDUR  TAKE 1 & 1/2 TABLETS by mouth once A DAY. What changed: See the new instructions.   Lidocaine -Menthol  3.5-7 % Ptch Place 1 patch onto the skin 2 (two) times daily as needed (knee/hip pain.).   Lumify  0.025 % Soln Generic drug: Brimonidine  Tartrate Place 1 drop into both eyes every 4 (four) hours as needed (irritated eyes).   nitroGLYCERIN  0.4 MG SL tablet Commonly known as: NITROSTAT  DISSOLVE 1 TABLET UNDER TONGUE IF NEEDED FOR CHEST PAIN. MAY REPEAT IN 5 MINUTES FOR 3 DOSES.   NON FORMULARY Get steroid inject for SI joint every 12 wks with Dr. Darlis Leash surgery spine   ondansetron  4 MG tablet Commonly known as: ZOFRAN  Take 1 tablet (4 mg total) by mouth every 6 (six) hours as needed for nausea.   ONE-A-DAY WOMENS PO Take 1 tablet by mouth daily with lunch.   pantoprazole  40 MG tablet Commonly known as: Protonix  Take 1 tablet (40 mg total) by mouth 2 (two) times daily before a meal. What changed:  how much to take how to take this when to take this additional instructions   polyethylene glycol 17 g packet Commonly known as: MIRALAX  / GLYCOLAX  Take 17 g by mouth 2 (two) times daily. Replaces: polyethylene glycol powder 17 GM/SCOOP powder   rosuvastatin  10 MG tablet Commonly known as: CRESTOR  TABLET ONE-HALF TABLET BY MOUTH DAILY   senna-docusate 8.6-50 MG tablet Commonly known as: Senokot-S Take 1 tablet by mouth 2 (two) times daily.   Synthroid  50 MCG tablet Generic drug: levothyroxine  TAKE 1 TABLET EACH DAY. NEED APPOINTMENT FOR ADDITIONAL REFILLS   Systane 0.4-0.3 % Gel ophthalmic gel Generic drug: Polyethyl Glycol-Propyl Glycol Place 1 Application into  both eyes every 4 (four) hours as needed (dry eyes).        Follow-up Information     Kaitlyn Heron HERO, MD. Schedule an appointment as soon as possible for a visit in 1 week(s).   Specialty: Family Medicine Contact information: [address] [place] [address] [phone]                Allergies  Allergen Reactions   Acyclovir And Related Other (See Comments)    unknown   Ciprofloxacin  Other (See Comments)    dizziness   Pravachol  [Pravastatin  Sodium] Other (See Comments)    cystitis   Sulfa  Antibiotics     nausea   Zocor  [Simvastatin ] Other (See Comments)    cystitis    Consultations: None   Procedures/Studies: CT ABDOMEN PELVIS W CONTRAST Result Date: 02/08/2024 CLINICAL DATA:  Abdominal pain EXAM: CT ABDOMEN AND PELVIS WITH CONTRAST TECHNIQUE: Multidetector CT imaging of the abdomen and pelvis was performed using the standard protocol following bolus administration of intravenous contrast. RADIATION DOSE REDUCTION: This exam was performed according to the departmental dose-optimization program which includes automated exposure control, adjustment of the mA and/or kV according  to patient size and/or use of iterative reconstruction technique. CONTRAST:  85mL OMNIPAQUE  IOHEXOL  300 MG/ML  SOLN COMPARISON:  11/09/2023 FINDINGS: Lower chest: Linear bibasilar scarring or atelectasis. No effusions. Hepatobiliary: No focal hepatic abnormality. Gallbladder unremarkable. Pancreas: No focal abnormality or ductal dilatation. Spleen: No focal abnormality.  Normal size. Adrenals/Urinary Tract: No adrenal abnormality. No focal renal abnormality. No stones or hydronephrosis. Urinary bladder is unremarkable. Stomach/Bowel: Normal appendix. Stomach, large and small bowel grossly unremarkable. Vascular/Lymphatic: Aortic atherosclerosis. No evidence of aneurysm or adenopathy. Reproductive: Prior hysterectomy.  No adnexal masses. Other: No free fluid or free air.  Musculoskeletal: Bilateral hip replacements. Degenerative disc and facet disease throughout the lumbar spine. No acute bony abnormality. IMPRESSION: No acute findings in the abdomen or pelvis. Aortic atherosclerosis. Electronically Signed   By: Franky Crease M.D.   On: 02/08/2024 00:11   DG Pelvis Portable Result Date: 01/27/2024 CLINICAL DATA:  Status post left total hip arthroplasty. EXAM: PORTABLE PELVIS 1-2 VIEWS COMPARISON:  June 10, 2017. FINDINGS: The left femoral and acetabular components are well situated. Expected postoperative changes are seen in the surrounding soft tissues. IMPRESSION: Interval placement of left total hip arthroplasty. Electronically Signed   By: Lynwood Landy Raddle M.D.   On: 01/27/2024 17:20   DG HIP UNILAT WITH PELVIS 1V LEFT Result Date: 01/27/2024 CLINICAL DATA:  Elective surgery. EXAM: DG HIP (WITH OR WITHOUT PELVIS) 1V*L* COMPARISON:  None Available. FINDINGS: Eight fluoroscopic spot views of the pelvis and left hip obtained in the operating room. Sequential images during hip arthroplasty. Fluoroscopy time 10 seconds. Dose 0.99 mGy. Previous right hip arthroplasty. IMPRESSION: Intraoperative fluoroscopy during left hip arthroplasty. Electronically Signed   By: Andrea Gasman M.D.   On: 01/27/2024 12:56   DG C-Arm 1-60 Min-No Report Result Date: 01/27/2024 Fluoroscopy was utilized by the requesting physician.  No radiographic interpretation.   DG C-Arm 1-60 Min-No Report Result Date: 01/27/2024 Fluoroscopy was utilized by the requesting physician.  No radiographic interpretation.      Subjective: Patient seen and examined at bedside.  Feels better and feels okay to go home today.  Has not had a bowel movement this morning.  Still having some intermittent nausea.  Tolerating diet.  No fever, chest pain reported.  Discharge Exam: Vitals:   02/09/24 0424 02/09/24 0908  BP: (!) 157/85 (!) 157/85  Pulse: 84   Resp: 18   Temp: 98 F (36.7 C)   SpO2: 94%      General: Pt is alert, awake, not in acute distress.  On room air.  Elderly female sitting on chair. Cardiovascular: rate controlled, S1/S2 + Respiratory: bilateral decreased breath sounds at bases Abdominal: Soft, NT, ND, bowel sounds + Extremities: no edema, no cyanosis    The results of significant diagnostics from this hospitalization (including imaging, microbiology, ancillary and laboratory) are listed below for reference.     Microbiology: No results found for this or any previous visit (from the past 240 hours).   Labs: BNP (last 3 results) No results for input(s): BNP in the last 8760 hours. Basic Metabolic Panel: Recent Labs  Lab 02/07/24 2058 02/08/24 0812 02/09/24 0550  NA 125* 140 135  K 3.6 3.4* 3.7  CL 90* 102 99  CO2 22 26 28   GLUCOSE 107* 104* 103*  BUN 11 9 14   CREATININE 0.54 0.39* 0.68  CALCIUM  9.0 9.0 8.7*   Liver Function Tests: Recent Labs  Lab 02/07/24 2058  AST 22  ALT 17  ALKPHOS 97  BILITOT  0.4  PROT 6.0*  ALBUMIN 3.6   No results for input(s): LIPASE, AMYLASE in the last 168 hours. No results for input(s): AMMONIA in the last 168 hours. CBC: Recent Labs  Lab 02/07/24 2058 02/08/24 0812 02/09/24 0550  WBC 8.4 6.1 7.7  NEUTROABS 6.8 4.6  --   HGB 10.1* 10.8* 10.5*  HCT 30.2* 32.7* 33.2*  MCV 91.5 93.2 96.5  PLT 421* 460* 495*   Cardiac Enzymes: No results for input(s): CKTOTAL, CKMB, CKMBINDEX, TROPONINI in the last 168 hours. BNP: Invalid input(s): POCBNP CBG: No results for input(s): GLUCAP in the last 168 hours. D-Dimer No results for input(s): DDIMER in the last 72 hours. Hgb A1c No results for input(s): HGBA1C in the last 72 hours. Lipid Profile No results for input(s): CHOL, HDL, LDLCALC, TRIG, CHOLHDL, LDLDIRECT in the last 72 hours. Thyroid  function studies No results for input(s): TSH, T4TOTAL, T3FREE, THYROIDAB in the last 72 hours.  Invalid input(s):  FREET3 Anemia work up No results for input(s): VITAMINB12, FOLATE, FERRITIN, TIBC, IRON, RETICCTPCT in the last 72 hours. Urinalysis    Component Value Date/Time   COLORURINE COLORLESS (A) 02/08/2024 0018   APPEARANCEUR CLEAR 02/08/2024 0018   LABSPEC 1.009 02/08/2024 0018   PHURINE 6.0 02/08/2024 0018   GLUCOSEU NEGATIVE 02/08/2024 0018   GLUCOSEU NEGATIVE 09/11/2020 1041   HGBUR NEGATIVE 02/08/2024 0018   BILIRUBINUR NEGATIVE 02/08/2024 0018   BILIRUBINUR Negative 10/27/2022 1556   KETONESUR NEGATIVE 02/08/2024 0018   PROTEINUR NEGATIVE 02/08/2024 0018   UROBILINOGEN 0.2 10/27/2022 1556   UROBILINOGEN 0.2 09/11/2020 1041   NITRITE NEGATIVE 02/08/2024 0018   LEUKOCYTESUR NEGATIVE 02/08/2024 0018   Sepsis Labs Recent Labs  Lab 02/07/24 2058 02/08/24 0812 02/09/24 0550  WBC 8.4 6.1 7.7   Microbiology No results found for this or any previous visit (from the past 240 hours).   Time coordinating discharge: 35 minutes  SIGNED:   Sophie Mao, MD  [facility] 02/09/2024, 10:33 AM

## 2024-02-09 NOTE — Evaluation (Signed)
 Occupational Therapy Evaluation Patient Details Name: Kaitlyn Parks MRN: [id number] DOB: 03/23/1935 Today's Date: 02/09/2024   History of Present Illness   Kaitlyn Parks is an 89 yo female presents 02/07/24 with nausea and vomiting. Of note, pt s/p L THA AA 01/27/24. PMH:  CAD, cystocele, hypothyroidism, macular degeneration, NSTEMI, osteoporosis, rectocele, R THA 2018, scoliosis, uterine prolapse, urinary incontinence     Clinical Impressions Pt presents with decline in function and safety with ADLs and ADL mobility with impaired balance and endurance. PTA pt's daughter lives with her and pt reports that she was Ind with ADLs, has a cleaning lady, daughter does the cooking, Ind with household amb, not a community ambulator due to pain in hip, knee and back, denies falls. Pt currently requires min A with LB ADLs and Sup wit mobility/transfers using RW. Pt educated on LB ADL A/E for home use with video demo and handout provided. OT will follow acutely to maximize level of function ad safety     If plan is discharge home, recommend the following:   A little help with bathing/dressing/bathroom;A little help with walking and/or transfers;Assistance with cooking/housework;Assist for transportation;Help with stairs or ramp for entrance     Functional Status Assessment   Patient has had a recent decline in their functional status and demonstrates the ability to make significant improvements in function in a reasonable and predictable amount of time.     Equipment Recommendations   Tub/shower seat     Recommendations for Other Services         Precautions/Restrictions   Precautions Precautions: Fall Recall of Precautions/Restrictions: Intact Restrictions Weight Bearing Restrictions Per Provider Order: No     Mobility Bed Mobility               General bed mobility comments: p up using RW walking to bathroom upon OT arrival    Transfers Overall transfer level: Needs  assistance Equipment used: Rolling walker (2 wheels) Transfers: Sit to/from Stand Sit to Stand: Supervision                  Balance Overall balance assessment: Needs assistance Sitting-balance support: Feet supported Sitting balance-Leahy Scale: Good     Standing balance support: Reliant on assistive device for balance, During functional activity Standing balance-Leahy Scale: Fair                             ADL either performed or assessed with clinical judgement   ADL Overall ADL's : Needs assistance/impaired Eating/Feeding: Independent   Grooming: Wash/dry hands;Wash/dry face;Supervision/safety;Standing   Upper Body Bathing: Set up   Lower Body Bathing: Minimal assistance   Upper Body Dressing : Set up   Lower Body Dressing: Minimal assistance   Toilet Transfer: Supervision/safety;Ambulation;Rolling walker (2 wheels);Regular Toilet;BSC/3in1   Toileting- Clothing Manipulation and Hygiene: Supervision/safety;Sit to/from stand       Functional mobility during ADLs: Supervision/safety;Rolling walker (2 wheels) General ADL Comments: pt educated on LB ADL A/E for home use with video demo and handout provided     Vision Baseline Vision/History: 1 Wears glasses Ability to See in Adequate Light: 0 Adequate Patient Visual Report: No change from baseline       Perception         Praxis         Pertinent Vitals/Pain Pain Assessment Pain Assessment: Faces Faces Pain Scale: Hurts little more Pain Location: L LE Pain Descriptors / Indicators: Sore, Discomfort Pain Intervention(s):  Limited activity within patient's tolerance, Premedicated before session, Repositioned     Extremity/Trunk Assessment Upper Extremity Assessment Upper Extremity Assessment: Overall WFL for tasks assessed   Lower Extremity Assessment Lower Extremity Assessment: Defer to PT evaluation       Communication Communication Communication: No apparent difficulties    Cognition Arousal: Alert Behavior During Therapy: [facility] for tasks assessed/performed                                 Following commands: Intact       Cueing  General Comments   Cueing Techniques: Verbal cues      Exercises     Shoulder Instructions      Home Living Family/patient expects to be discharged to:: Private residence Living Arrangements: Children Available Help at Discharge: Family;Available 24 hours/day Type of Home: House Home Access: Stairs to enter [employer] of Steps: 3 Entrance Stairs-Rails: Right;Left;Can reach both Home Layout: Two level;1/2 bath on main level Alternate Level Stairs-Number of Steps: 2 steps from sunroom to rest of the home; flight of steps up to 2nd   Bathroom Shower/Tub: Producer, television/film/video: Standard     Home Equipment: Agricultural consultant (2 wheels);BSC/3in1   Additional Comments: currently sleeping in sunroom on main level, half bath up 2 steps and has done it ind a couple of times since d/c but mostly with supv      Prior Functioning/Environment               Mobility Comments: pt reports ind with household amb, not a community ambulator due to pain in hip, knee and back, denies falls ADLs Comments: pt reports ind with self care, has a cleaning lady, daughter does the cooking    OT Problem List: Decreased strength;Decreased activity tolerance;Impaired balance (sitting and/or standing)   OT Treatment/Interventions: Self-care/ADL training;Patient/family education;Balance training;Therapeutic activities;DME and/or AE instruction      OT Goals(Current goals can be found in the care plan section)   Acute Rehab OT Goals Patient Stated Goal: go home OT Goal Formulation: With patient Time For Goal Achievement: 02/23/24 Potential to Achieve Goals: Good ADL Goals Pt Will Perform Grooming: with set-up;with modified independence;standing Pt Will Perform Lower Body Bathing: with contact  guard assist;with supervision;with adaptive equipment;with caregiver independent in assisting Pt Will Perform Lower Body Dressing: with contact guard assist;with supervision;with adaptive equipment;with caregiver independent in assisting Pt Will Transfer to Toilet: with modified independence;ambulating Pt Will Perform Toileting - Clothing Manipulation and hygiene: with modified independence;sitting/lateral leans;sit to/from stand   OT Frequency:  Min 2X/week    Co-evaluation              AM-PAC OT 6 Clicks Daily Activity     Outcome Measure Help from another person eating meals?: None Help from another person taking care of personal grooming?: A Little Help from another person toileting, which includes using toliet, bedpan, or urinal?: A Little Help from another person bathing (including washing, rinsing, drying)?: A Little Help from another person to put on and taking off regular upper body clothing?: A Little Help from another person to put on and taking off regular lower body clothing?: A Little 6 Click Score: 19   End of Session Equipment Utilized During Treatment: Rolling walker (2 wheels) Nurse Communication: Mobility status  Activity Tolerance: Patient tolerated treatment well Patient left: in chair;with call bell/phone within reach  OT Visit Diagnosis: Unsteadiness on feet (R26.81);Other  abnormalities of gait and mobility (R26.89);Pain Pain - Right/Left: Left Pain - part of body: Hip;Leg                Time: 8641-8577 OT Time Calculation (min): 24 min Charges:  OT General Charges $OT Visit: 1 Visit OT Evaluation $OT Eval Low Complexity: 1 Low OT Treatments $Therapeutic Activity: 8-22 mins    Jacques Karna Loose 02/09/2024, 2:55 PM

## 2024-02-10 ENCOUNTER — Telehealth: Payer: Self-pay

## 2024-02-10 NOTE — Transitions of Care (Post Inpatient/ED Visit) (Signed)
 02/10/2024  Name: Kaitlyn Parks MRN: [id number] DOB: 1935/07/28  Today's TOC FU Call Status: Today's TOC FU Call Status:: Successful TOC FU Call Completed TOC FU Call Complete Date: 02/10/24 Patient's Name and Date of Birth confirmed.  Transition Care Management Follow-up Telephone Call Discharge Facility: [facility]) Type of Discharge: Inpatient Admission Primary Inpatient Discharge Diagnosis:: ulcer How have you been since you were released from the hospital?: Better Any questions or concerns?: No  Items Reviewed: Did you receive and understand the discharge instructions provided?: Yes Medications obtained,verified, and reconciled?: Yes (Medications Reviewed) Any new allergies since your discharge?: No Dietary orders reviewed?: Yes Do you have support at home?: Yes People in Home [RPT]: child(ren), adult  Medications Reviewed Today: Medications Reviewed Today     Reviewed by Emmitt Pan, LPN (Licensed Practical Nurse) on 02/10/24 at 1019  Med List Status: <None>   Medication Order Taking? Sig Documenting Provider Last Dose Status Informant  Aflibercept  (EYLEA  IZ) [id number] Yes 1 Dose by Intravitreal route See admin instructions. Every 7 weeks [provider]  Active Self, Pharmacy Records           Med Note JACKOLYN WADDELL DEL   Tue Feb 08, 2024  7:02 AM)    amLODipine  (NORVASC ) 2.5 MG tablet [id number] Yes Take 1 tablet (2.5 mg total) by mouth daily. [name] [name], MD  Active Self, Pharmacy Records  aspirin  (ASPIRIN  CHILDRENS) 81 MG chewable tablet [id number] Yes Chew 1 tablet (81 mg total) by mouth 2 (two) times daily with a meal. Hill, Valery RAMAN, PA-C  Active Self, Pharmacy Records  Biotin w/ Vitamins C & E (HAIR/SKIN/NAILS PO) [id number] Yes Take 1 tablet by mouth daily with lunch. [provider]  Active Self, Pharmacy Records  bisacodyl  (DULCOLAX) 10 MG suppository [id number] Yes Place 1 suppository (10 mg total) rectally daily as needed for  severe constipation. Cheryle Page, MD  Active   Brimonidine  Tartrate (LUMIFY ) 0.025 % SOLN [id number] Yes Place 1 drop into both eyes every 4 (four) hours as needed (irritated eyes). [provider]  Active Self, Pharmacy Records  Cranberry 400 MG CAPS [id number] Yes Take 400 mg by mouth in the morning. [provider]  Active Self, Pharmacy Records  hydrALAZINE  (APRESOLINE ) 10 MG tablet [id number] Yes TAKE (1) TABLET TWICE A DAY. MAY TAKE EXTRA DOSE FOR SBP >160 UP TO A TOTAL OF 4 TIMES A DAY AS NEEDED  Patient taking differently: Take 10 mg by mouth 4 (four) times daily as needed (SBP >160).   Lavona Agent, MD  Active Self, Pharmacy Records  isosorbide  mononitrate (IMDUR ) 60 MG 24 hr tablet [id number] Yes TAKE 1 & 1/2 TABLETS by mouth once A DAY.  Patient taking differently: Take 90 mg by mouth at bedtime.   [name] [name], MD  Active Self, Pharmacy Records  Lidocaine -Menthol  3.5-7 % [facility] [id number] Yes Place 1 patch onto the skin 2 (two) times daily as needed (knee/hip pain.). [provider]  Active Self, Pharmacy Records  Multiple Vitamins-Calcium  (ONE-A-DAY WOMENS PO) [id number] Yes Take 1 tablet by mouth daily with lunch. [provider]  Active Self, Pharmacy Records  nitroGLYCERIN  (NITROSTAT ) 0.4 MG SL tablet [id number] Yes DISSOLVE 1 TABLET UNDER TONGUE IF NEEDED FOR CHEST PAIN. MAY REPEAT IN 5 MINUTES FOR 3 DOSES. Lavona Agent, MD  Active Self, Pharmacy Records  [facility] [id number] Yes Get steroid inject for SI joint every 12 wks with Dr. Darlis Leash surgery spine [provider]  Active Self, Pharmacy  Records  ondansetron  (ZOFRAN ) 4 MG tablet [id number] Yes Take 1 tablet (4 mg total) by mouth every 6 (six) hours as needed for nausea. Cheryle Page, MD  Active   pantoprazole  (PROTONIX ) 40 MG tablet [id number] Yes Take 1 tablet (40 mg total) by mouth 2 (two) times daily before a meal. Cheryle, Kshitiz, MD  Active   Polyethyl  Glycol-Propyl Glycol (SYSTANE) 0.4-0.3 % GEL ophthalmic gel [id number] Yes Place 1 Application into both eyes every 4 (four) hours as needed (dry eyes). [provider]  Active Self, Pharmacy Records  polyethylene glycol (MIRALAX  / GLYCOLAX ) 17 g packet [id number] Yes Take 17 g by mouth 2 (two) times daily. Cheryle Page, MD  Active   rosuvastatin  (CRESTOR ) 10 MG tablet [id number] Yes TABLET ONE-HALF TABLET BY MOUTH DAILY Lavona Agent, MD  Active Self, Pharmacy Records  senna-docusate (SENOKOT-S) 8.6-50 MG tablet [id number] Yes Take 1 tablet by mouth 2 (two) times daily. Cheryle Page, MD  Active   SYNTHROID  50 MCG tablet [id number] Yes TAKE 1 TABLET EACH DAY. NEED APPOINTMENT FOR ADDITIONAL REFILLS [name] HERO, MD  Active Self, Pharmacy Records            Home Care and Equipment/Supplies: Were Home Health Services Ordered?: NA Any new equipment or medical supplies ordered?: NA  Functional Questionnaire: Do you need assistance with bathing/showering or dressing?: No Do you need assistance with meal preparation?: No Do you need assistance with eating?: No Do you have difficulty maintaining continence: No Do you need assistance with getting out of bed/getting out of a chair/moving?: No Do you have difficulty managing or taking your medications?: No  Follow up appointments reviewed: PCP Follow-up appointment confirmed?: Yes Date of PCP follow-up appointment?: 02/14/24 Follow-up Provider: [facility] Follow-up appointment confirmed?: Yes Date of Specialist follow-up appointment?: 04/11/24 Follow-Up Specialty Provider:: GI Do you need transportation to your follow-up appointment?: No Do you understand care options if your condition(s) worsen?: Yes-patient verbalized understanding    SIGNATURE Julian Lemmings, LPN [facility] Nurse Health Advisor Direct Dial [phone]

## 2024-02-10 NOTE — Progress Notes (Signed)
 [facility] - Clinic Note  02/21/2024    CHIEF COMPLAINT Patient presents for Retina Follow Up  HISTORY OF PRESENT ILLNESS: Kaitlyn Parks is a 89 y.o. female who presents to the clinic today for:   HPI     Retina Follow Up   In both eyes.  This started 7 weeks ago.  Duration of 7 weeks.  Since onset it is stable.  I, the attending physician,  performed the HPI with the patient and updated documentation appropriately.        Comments   7 week retina follow up ARMD OU and I'VE OS pt is reporting no vision changes noticed she denies any flashes or floaters       Last edited by Kaitlyn Rogue, MD on 02/21/2024  6:57 PM.    Pt states she's been better. Pt had a hip replacement 6.25.25. No VA changes. Didn't feel like she read as well on the eye chart today.   Referring physician: Ozell Parks [name], MD [address] [place],  [place] [address]  HISTORICAL INFORMATION:  Selected notes from the MEDICAL RECORD NUMBER Referred by Kaitlyn Parks for concern of exu ARMD LEE: 11.26.19 (J. 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 Every 7 weeks   Brimonidine  Tartrate (LUMIFY ) 0.025 % SOLN Place 1 drop into both eyes every 4 (four) hours as needed (irritated eyes).   Polyethyl Glycol-Propyl Glycol (SYSTANE) 0.4-0.3 % GEL ophthalmic gel Place 1 Application into both eyes every 4 (four) hours as needed (dry eyes).   No current facility-administered medications for this visit. (Ophthalmic Drugs)   Current Outpatient Medications (Other)  Medication Sig   amLODipine  (NORVASC ) 2.5 MG tablet Take 1 tablet (2.5 mg total) by mouth daily.   aspirin  (ASPIRIN  CHILDRENS) 81 MG chewable tablet Chew 1 tablet  (81 mg total) by mouth 2 (two) times daily with a meal.   Biotin w/ Vitamins C & E (HAIR/SKIN/NAILS PO) Take 1 tablet by mouth daily with lunch.   Cranberry 400 MG CAPS Take 400 mg by mouth in the morning.   hydrALAZINE  (APRESOLINE ) 10 MG tablet TAKE (1) TABLET TWICE A DAY. MAY TAKE EXTRA DOSE FOR SBP >160 UP TO A TOTAL OF 4 TIMES A DAY AS NEEDED   isosorbide  mononitrate (IMDUR ) 60 MG 24 hr tablet TAKE 1 & 1/2 TABLETS by mouth once A DAY.   Lidocaine -Menthol  3.5-7 % [facility] Place 1 patch onto the skin 2 (two) times daily as needed (knee/hip pain.).   Multiple Vitamins-Calcium  (ONE-A-DAY WOMENS PO) Take 1 tablet by mouth daily with lunch.   nitroGLYCERIN  (NITROSTAT ) 0.4 MG SL tablet DISSOLVE 1 TABLET UNDER TONGUE IF NEEDED FOR CHEST PAIN. MAY REPEAT IN 5 MINUTES FOR 3 DOSES.   ondansetron  (ZOFRAN ) 4 MG tablet Take 1 tablet (4 mg total) by mouth every 6 (six) hours as needed for nausea.   pantoprazole  (PROTONIX ) 40 MG tablet Take 1 tablet (40 mg total) by mouth 2 (two) times daily before a meal.   polyethylene glycol (MIRALAX  / GLYCOLAX ) 17 g packet Take 17 g by mouth 2 (two) times daily.   rosuvastatin  (CRESTOR ) 10 MG tablet TABLET ONE-HALF TABLET BY MOUTH DAILY   SYNTHROID  50 MCG tablet TAKE 1 TABLET EACH DAY. NEED APPOINTMENT FOR ADDITIONAL  REFILLS   NON FORMULARY Get steroid inject for SI joint every 12 wks with Kaitlyn Parks   No current facility-administered medications for this visit. (Other)   REVIEW OF SYSTEMS: ROS   Positive for: Gastrointestinal, Genitourinary, Musculoskeletal, Cardiovascular, Eyes Negative for: Constitutional, Neurological, Skin, HENT, Endocrine, Respiratory, Psychiatric, Allergic/Imm, Heme/Lymph Last edited by Resa Delon ORN, COT on 02/21/2024  1:22 PM.       ALLERGIES Allergies  Allergen Reactions   Acyclovir And Related Other (See Comments)    unknown   Ciprofloxacin  Other (See Comments)    dizziness   Pravachol  [Pravastatin   Sodium] Other (See Comments)    cystitis   Sulfa  Antibiotics     nausea   Zocor  [Simvastatin ] Other (See Comments)    cystitis   PAST MEDICAL HISTORY Past Medical History:  Diagnosis Date   Arthritis    DDD, scoliosis, sees Kaitlyn Parks for this, uses norco very rarely for pain   Arthritis    lumbar stenosis, gets steroid injections for [place] Parks   Bradycardia 11/11/2017   CAD (coronary artery disease)    LAD stenting of a 90% lesion 2012   Cystocele    Educated about COVID-19 virus infection 12/06/2019   Elevated cholesterol    GERD (gastroesophageal reflux disease)    dx in work up 2016 for atypical CP at OSH   pt. denies   Heart murmur    Hypertensive retinopathy    OU   Hypothyroidism    Interstitial cystitis    sees Kaitlyn Parks   Macular degeneration    OU   Neuromuscular disorder [facility])    neuropathy   NSTEMI (non-ST elevated myocardial infarction) (HCC)    Osteoporosis    Rectocele    S/P hip replacement, right 06/12/2017   Scoliosis    Thyroid  disease    Hypothyroid   Urinary incontinence    USI   Uterine prolapse    Past Surgical History:  Procedure Laterality Date   BLADDER SUSPENSION  2011   CATARACT EXTRACTION Bilateral 2015   Dr. Cleatus   CORONARY ANGIOPLASTY WITH STENT PLACEMENT  04/21/2010   LAD 80%, RCA 30%, nl EF, s/p DES LAD   EYE SURGERY     LEFT HEART CATH AND CORONARY ANGIOGRAPHY N/A 06/14/2017   Procedure: LEFT HEART CATH AND CORONARY ANGIOGRAPHY;  Surgeon: Kaitlyn Parks PARAS, MD;  Location: [facility] INVASIVE CV LAB;  Service: Cardiovascular;  Laterality: N/A;   OOPHORECTOMY  2011   BSO   TOTAL HIP ARTHROPLASTY Right 06/10/2017   Procedure: RIGHT TOTAL HIP ARTHROPLASTY ANTERIOR APPROACH;  Surgeon: Kaitlyn Rogue, MD;  Location: [facility] ORS;  Service: Orthopedics;  Laterality: Right;  Needs RNFA   TOTAL HIP ARTHROPLASTY Left 01/27/2024   Procedure: ARTHROPLASTY, HIP, TOTAL, ANTERIOR APPROACH;  Surgeon: Kaitlyn Rogue, MD;  Location: [facility] ORS;   Service: Orthopedics;  Laterality: Left;   VAGINAL HYSTERECTOMY  2011   LAVH BSO; benign   FAMILY HISTORY Family History  Problem Relation Age of Onset   Hypertension Mother    Heart disease Mother    Heart disease Father    Heart disease Sister    Diabetes Sister    Uterine cancer Sister        mets to lungs   Lung cancer Sister    Scoliosis Sister    Heart disease Brother    Colon cancer Paternal Aunt 13   Breast cancer Paternal Aunt        Age 71's   Breast  cancer Paternal Aunt    Leukemia Paternal Aunt    Lung cancer Paternal Grandfather        smoker   Arthritis Daughter    Breast cancer Cousin        Maternal 1st cousins-Age 35's   Esophageal cancer Neg Hx    Pancreatic cancer Neg Hx    Stomach cancer Neg Hx    SOCIAL HISTORY Social History   Tobacco Use   Smoking status: Never   Smokeless tobacco: Never  Vaping Use   Vaping status: Never Used  Substance Use Topics   Alcohol  use: No    Comment: Rare   Drug use: No       OPHTHALMIC EXAM: Base Eye Exam     Visual Acuity (Snellen - Linear)       Right Left   Dist [place] 20/40 20/40 -3   Dist ph [place] 20/30 -2 20/30 -3         Tonometry (Tonopen, 1:27 PM)       Right Left   Pressure 16 16         Pupils       Pupils Dark Light Shape React APD   Right PERRL 3 2 Round Brisk None   Left PERRL 3 2 Round Brisk None         Visual Fields       Left Right    Full Full         Extraocular Movement       Right Left    Full, Ortho Full, Ortho         Neuro/Psych     Oriented x3: Yes   Mood/Affect: Normal         Dilation     Both eyes: 2.5% Phenylephrine  @ 1:27 PM           Slit Lamp and Fundus Exam     Slit Lamp Exam       Right Left   Lids/Lashes mild Telangiectasia -- improved, marginal lesion nasal UL Dermatochalasis - upper lid   Conjunctiva/Sclera White and quiet White and quiet, inferior conj chalasis   Cornea Arcus, 2+ fine Punctate epithelial erosions, tear  film debris, decreased TBUT arcus, tear film debris, 2+ inferior Punctate epithelial erosions   Anterior Chamber Deep and clear Deep and clear   Iris Round and dilated Round and well dilated   Lens PC IOL in good position, 1+Posterior capsular opacification IN PC IOL in good position with open PC   Anterior Vitreous Vitreous syneresis, Posterior vitreous detachment Vitreous syneresis, Posterior vitreous detachment, vitreous condensations         Fundus Exam       Right Left   Disc Pink and Sharp, Compact, focal PPP Pink and Sharp, mild temporal PPA/PPP   C/D Ratio 0.3 0.3   Macula Blunted foveal reflex, Drusen, RPE mottling and clumping, early Atrophy, +PEDs -- stably improved, trace cystic changes -- stably improved, No frank heme, no edema Blunted foveal reflex, +drusen, pigment clumping, RPE mottling, clumping and atrophy, no heme, central PED / CNV with overlying cystic changes -- stably improved, +GA   Vessels attenuated, Tortuous attenuated, Tortuous   Periphery Attached, scattered reticular degeneration Attached, scattered reticular degeneration           IMAGING AND PROCEDURES  Imaging and Procedures for @TODAY @  OCT, Retina - OU - Both Eyes       Right Eye Quality was good. Central Foveal Thickness: 241. Progression  has been stable. Findings include no IRF, no SRF, abnormal foveal contour, retinal drusen , intraretinal hyper-reflective material, pigment epithelial detachment, outer retinal atrophy (stable improvement in cystic changes nasal and temporal fovea, patchy ORA / GA -- no fluid).   Left Eye Quality was good. Central Foveal Thickness: 255. Progression has been stable. Findings include normal foveal contour, no IRF, no SRF, retinal drusen , intraretinal hyper-reflective material, pigment epithelial detachment, outer retinal atrophy (Stable improvement in IRF/cystic changes inferior fovea, stable improvement in [facility] / PED inferior to fovea ).   Notes *Images  captured and stored on drive  Diagnosis / Impression:  OD: exudative ARMD -- stable improvement in cystic changes nasal and temporal fovea, patchy ORA / GA -- no fluid OS: exu-ARMD -- Stable improvement in IRF/cystic changes inferior fovea, stable improvement in [facility] / PED inferior to fovea   Clinical management:  See below  Abbreviations: NFP - Normal foveal profile. CME - cystoid macular edema. PED - pigment epithelial detachment. IRF - intraretinal fluid. SRF - subretinal fluid. EZ - ellipsoid zone. ERM - epiretinal membrane. ORA - outer retinal atrophy. ORT - outer retinal tubulation. SRHM - subretinal hyper-reflective material      Intravitreal Injection, Pharmacologic Agent - OS - Left Eye       Time Out 02/21/2024. 2:20 PM. Confirmed correct patient, procedure, site, and patient consented.   Anesthesia Topical anesthesia was used. Anesthetic medications included Lidocaine  2%, Proparacaine 0.5%.   Procedure Preparation included 5% betadine  to ocular surface, eyelid speculum. A (32g) needle was used.   Injection: 2 mg aflibercept  2 MG/0.05ML   Route: Intravitreal, Site: Left Eye   NDC: Q956576, Lot: [id number], Expiration date: 05/02/2025, Waste: 0 mL   Post-op Post injection exam found visual acuity of at least counting fingers. The patient tolerated the procedure well. There were no complications. The patient received written and verbal post procedure care education. Post injection medications were not given.             ASSESSMENT/PLAN:    ICD-10-CM   1. Exudative age-related macular degeneration of left eye with active choroidal neovascularization (HCC)  H35.3221 OCT, Retina - OU - Both Eyes    Intravitreal Injection, Pharmacologic Agent - OS - Left Eye    aflibercept  (EYLEA ) SOLN 2 mg    2. Exudative age-related macular degeneration of right eye with active choroidal neovascularization (HCC)  H35.3211     3. Essential hypertension  I10     4.  Hypertensive retinopathy of both eyes  H35.033     5. Pseudophakia of both eyes  Z96.1     6. Dry eyes  H04.123     7. Left posterior capsular opacification  H26.492       1. Exudative age-related macular degeneration, left eye  - OCT 4.30.2020 showed interval conversion of OS from nonexudative ARMD to exu-ARMD with large dome-shaped PED with overlying IRF/SRF  - FA 05.28.20 -- no active CNV OS, just staining - s/p IVA OS #1 (04.30.20), #2 (05.28.20), #3 (06.26.20), #4 (08.03.20), #5 (09.11.20), #6 (10.19.20), #7 (11.23.20) - reactivation of CNV noted on 05.05.22 -- s/p IVA OS  #8 (05.05.22), #9 (06.06.22), #10 (07.19.22), #11 (09.13.22), #12 (10.25.22), #13 (12.13.22), #14 (01.30.23), #15 (03.20.23), #16 (05.08.23), #17 (06.19.23), #18 (08.07.23), #19 (10.02.23), #20 (11.20.23), #21 (01.15.24), #22 (12.19.24), #23 (02.21.25) =========================================== - s/p IVE OS #1 (03.11.24), #2 (04.22.24), #3 (05.29.24), #4 (07.22.24), #5 (09.09.24), #6 (10.28.24), #7 (12.19.24), #8 (04.18.25), #9 (06.02.25) - **Interval increase in IRF  overlying PED at 8 wks on 09.13.22 visit (IVA)**  - BCVA OS 20/30 from 20/40 at 7 wks - OCT shows Stable improvement in IRF/cystic changes inferior fovea, stable improvement in [facility] / PED inferior to fovea at 7 weeks  - recommend IVE OS #10 today, 07.21.25 with f/u ext to 8 weeks.  - pt wishes to proceed with injection  - RBA of procedure discussed, questions answered - see procedure note - IVE informed consent obtained and signed, 03.11.24  - benefits investigation for Eylea  initiated 4.30.2020 -- approved for 2025 but pt covering 20% coinsurance on medications  - f/u in 8 weeks -- DFE/OCT, possible injection  2. Age related macular degeneration, exudative, OD - interval development of IRF first noted on 09.11.20 -- conversion from nonexudative to exudative ARMD - pt initially presented on 11.27.19 due to alert from [facility] home monitoring system  for OD  - [facility] prescribed by Dr. Cleatus - FA (5.28.2020) showed staining / window defect corresponding temporal RPE changes OU -- no active CNV OU - S/P IVA OD #1 (09.11.20), #2 (10.19.20), #3 (11.23.20), #4 (01.07.21), #5 (2.19.21), #6 (04.02.21), #7 (05.28.21), #8 (07.23.21), #9 (09.24.21), #10 (11.29.21), #11 (02.11.22), #12 (05.05.22), #13 (7.19.22), #14 (09.13.22), #15 (10.02.23), #16 (11.20.23), #17 (01.15.24), #18 (03.11.24) - OCT shows stable improvement in cystic changes nasal and temporal fovea, patchy ORA / GA -- no fluid  - BCVA OD stable at 20/30 (16+ mos since last injxn)  - recommend holding IVA OD again today -- will treat PRN - IVA informed consent obtained and signed, 05.08.23 (OU) - see procedure note  - f/u 7 weeks DFE, OCT  3,4. Hypertensive retinopathy OU  - discussed importance of tight BP control  - monitor  5. Pseudophakia OU  - s/p CE/IOL OU  - beautiful surgeries, doing well  - monitor  6. Dry eyes OU  - recommend artificial tears and lubricating ointment as needed  - using Miebo and Lumify  per Dr. Vivian  7. PCO OU (OS > OD)  - s/p Yag Cap OS 07.31.24 - BCVA OS 20/30 and pleased with the result   Ophthalmic Meds Ordered this visit:  Meds ordered this encounter  Medications   aflibercept  (EYLEA ) SOLN 2 mg     Return in about 8 weeks (around 04/17/2024) for exu ARMD OS, DFE, OCT, Possible Injxn.  There are no Patient Instructions on file for this visit.  This document serves as a record of services personally performed by Redell JUDITHANN Hans, MD, PhD. It was created on their behalf by Avelina Pereyra, COA an ophthalmic technician. The creation of this record is the provider's dictation and/or activities during the visit.   Electronically signed by: Avelina GORMAN Pereyra, COT  02/21/24  6:58 PM   Redell JUDITHANN Hans, M.D., Ph.D. Diseases & Surgery of the Retina and Vitreous [facility]  I have reviewed the above documentation for  accuracy and completeness, and I agree with the above. Redell JUDITHANN Hans, M.D., Ph.D. 02/21/24 6:59 PM   Abbreviations: M myopia (nearsighted); A astigmatism; H hyperopia (farsighted); P presbyopia; Mrx spectacle prescription;  CTL contact lenses; OD right eye; OS left eye; OU both eyes  XT exotropia; ET esotropia; PEK punctate epithelial keratitis; PEE punctate epithelial erosions; DES dry eye syndrome; MGD meibomian gland dysfunction; ATs artificial tears; PFAT's preservative free artificial tears; NSC nuclear sclerotic cataract; PSC posterior subcapsular cataract; ERM epi-retinal membrane; PVD posterior vitreous detachment; RD retinal detachment; DM diabetes mellitus; DR diabetic retinopathy;  NPDR non-proliferative diabetic retinopathy; PDR proliferative diabetic retinopathy; CSME clinically significant macular edema; DME diabetic macular edema; dbh dot blot hemorrhages; CWS cotton wool spot; POAG primary open angle glaucoma; C/D cup-to-disc ratio; HVF humphrey visual field; GVF goldmann visual field; OCT optical coherence tomography; IOP intraocular pressure; BRVO Branch retinal vein occlusion; CRVO central retinal vein occlusion; CRAO central retinal artery occlusion; BRAO branch retinal artery occlusion; RT retinal tear; SB scleral buckle; PPV pars plana vitrectomy; VH Vitreous hemorrhage; PRP panretinal laser photocoagulation; IVK intravitreal kenalog ; VMT vitreomacular traction; MH Macular hole;  NVD neovascularization of the disc; NVE neovascularization elsewhere; AREDS age related eye disease study; ARMD age related macular degeneration; POAG primary open angle glaucoma; EBMD epithelial/anterior basement membrane dystrophy; ACIOL anterior chamber intraocular lens; IOL intraocular lens; PCIOL posterior chamber intraocular lens; Phaco/IOL phacoemulsification with intraocular lens placement; PRK photorefractive keratectomy; LASIK laser assisted in situ keratomileusis; HTN hypertension; DM diabetes mellitus;  COPD chronic obstructive pulmonary disease

## 2024-02-11 ENCOUNTER — Telehealth: Payer: Self-pay

## 2024-02-11 ENCOUNTER — Telehealth: Payer: Self-pay | Admitting: *Deleted

## 2024-02-11 ENCOUNTER — Inpatient Hospital Stay: Admitting: Family Medicine

## 2024-02-11 NOTE — Telephone Encounter (Signed)
 Copied from CRM [id number]. Topic: Clinical - Home Health Verbal Orders >> Feb 11, 2024  4:43 PM Lavanda BIRCH wrote: Caller/Agency: [facility]  Callback Number: [phone] Service Requested: Physical Therapy Frequency: 1  week 1, 2 week 3, 1 week 5 Any new concerns about the patient? Yes, having constipation issues. When she saw her yesterday her last BM was on Sunday. A little here and there but not able to fully clean out.

## 2024-02-11 NOTE — Telephone Encounter (Signed)
 Copied from CRM [id number]. Topic: Clinical - Medical Advice >> Feb 11, 2024  8:15 AM Kaitlyn Parks wrote: Reason for CRM: Patients daughter is calling in because she would like a nurse to call her mom so she can tell them what has been going on lately and get advice on what she should do. She has an appointment on Monday, and Dr Ozell doesn't have any availability at the moment Patient  can be reached at her mobile number

## 2024-02-11 NOTE — Telephone Encounter (Signed)
 PCP was informed of the message below and advised two options for the patient: 1) magnesium citrate 200ml bottle-can purchase OTC at [facility], drink the entire bottle over 2 hours until this is all gone and this can cause explosive diarrhea  2)use an enema  Patient was informed and advised PCP stated if she has not had a bowel movement after use of either option, to seek treatment at an urgent care immediately as she could be impacted.

## 2024-02-11 NOTE — Telephone Encounter (Signed)
 Spoke with the patient for more information.  She stated she has not had a solid BM since Sunday,  taking Miralx and Senna tablets daily and also used a suppository with no relief.  Stated she is only eating salads, soups and spinach quiche and is not drinking a lot of water .  I advised the patient to try eating more solid foods such as grilled chicken, rice, applesauce or toast to bulk up the stools.  Patient was advised to return to the ER if any new or worsening of symptoms occur and message was forwarded to PCP for recommendations until the appt on 7/14.

## 2024-02-11 NOTE — Telephone Encounter (Signed)
Toc already completed

## 2024-02-14 ENCOUNTER — Inpatient Hospital Stay: Admitting: Family Medicine

## 2024-02-14 NOTE — Telephone Encounter (Signed)
 Ok to close

## 2024-02-14 NOTE — Telephone Encounter (Signed)
 Ok to send verbal orders

## 2024-02-14 NOTE — Telephone Encounter (Signed)
Spoke with [employer] and informed her of the approval for orders as below.

## 2024-02-15 ENCOUNTER — Telehealth: Payer: Self-pay | Admitting: Internal Medicine

## 2024-02-15 NOTE — Telephone Encounter (Signed)
 Inbound call from patient's daughter stating patient has recently been seen at the ED for vomiting and diarrhea. States patient is still having complications. Patient is currently scheduled for 9/2. Requesting to know if there are any recommendation that could help sooner. Requesting a call back to the patient. Please advise, thank you

## 2024-02-15 NOTE — Telephone Encounter (Signed)
 Pt reports having issues with diarrhea. Requesting an appt. Pt scheduled to see Alan Coombs PA 02/23/24@1 :30pm. Pt aware of appt.

## 2024-02-15 NOTE — Telephone Encounter (Signed)
 Do you have the daughters phone number or name? She has 2 daughters.

## 2024-02-15 NOTE — Telephone Encounter (Signed)
 A call back was requested to the patient instead of daughter. Please advise, thank you

## 2024-02-17 ENCOUNTER — Ambulatory Visit: Admitting: Family Medicine

## 2024-02-17 ENCOUNTER — Encounter: Payer: Self-pay | Admitting: Family Medicine

## 2024-02-17 VITALS — BP 120/50 | HR 52 | Temp 98.0°F | Ht 63.0 in | Wt 124.2 lb

## 2024-02-17 DIAGNOSIS — K219 Gastro-esophageal reflux disease without esophagitis: Secondary | ICD-10-CM

## 2024-02-17 DIAGNOSIS — E871 Hypo-osmolality and hyponatremia: Secondary | ICD-10-CM | POA: Diagnosis not present

## 2024-02-17 DIAGNOSIS — R110 Nausea: Secondary | ICD-10-CM

## 2024-02-17 DIAGNOSIS — I10 Essential (primary) hypertension: Secondary | ICD-10-CM | POA: Diagnosis not present

## 2024-02-17 MED ORDER — AMLODIPINE BESYLATE 2.5 MG PO TABS: 2.5000 mg | ORAL_TABLET | Freq: Every day | ORAL | 3 refills | Status: AC

## 2024-02-17 MED ORDER — ONDANSETRON HCL 4 MG PO TABS
4.0000 mg | ORAL_TABLET | Freq: Four times a day (QID) | ORAL | 0 refills | Status: DC | PRN
Start: 2024-02-17 — End: 2024-04-26

## 2024-02-17 NOTE — Progress Notes (Signed)
 Established Patient Office Visit  Subjective   Patient ID: Kaitlyn Parks, female    DOB: 1934/12/30  Age: 89 y.o. MRN: [id number]  Chief Complaint  Patient presents with   Hospitalization Follow-up    Pt had her hip surgery on 6/26, then about 2 days later she started having nausea and vomiting and was admitted with a sodium of 125. She was re-hydrated and her sodium level returned to normal. Has been trying to keep down fruits, breads, creamed potatoes, has trouble with other foods, state she ate chicken salad once and it made her sick. States she is having BM's with daily miralax . Patient reports she is tsill having a significant amount of nausea, has appointment  with GI on Wednesday 7/23. Was given ondansetron  when she left the hospital and states that it does help.   Pt states her hip is good, states that her knee is hurting because of the arthritis in her knee. States that she is having PT and home health in her home for additional help and therapy.     Current Outpatient Medications  Medication Instructions   Aflibercept  (EYLEA  IZ) 1 Dose, See admin instructions   amLODipine  (NORVASC ) 2.5 mg, Oral, Daily   Aspirin  Low Dose 81 mg, Oral, 2 times daily with meals   Biotin w/ Vitamins C & E (HAIR/SKIN/NAILS PO) 1 tablet, Daily with lunch   Brimonidine  Tartrate (LUMIFY ) 0.025 % SOLN 1 drop, Every 4 hours PRN   Cranberry 400 mg, Every morning   hydrALAZINE  (APRESOLINE ) 10 MG tablet TAKE (1) TABLET TWICE A DAY. MAY TAKE EXTRA DOSE FOR SBP >160 UP TO A TOTAL OF 4 TIMES A DAY AS NEEDED   isosorbide  mononitrate (IMDUR ) 60 MG 24 hr tablet TAKE 1 & 1/2 TABLETS by mouth once A DAY.   Lidocaine -Menthol  3.5-7 % PTCH 1 patch, 2 times daily PRN   Multiple Vitamins-Calcium  (ONE-A-DAY WOMENS PO) 1 tablet, Daily with lunch   nitroGLYCERIN  (NITROSTAT ) 0.4 MG SL tablet DISSOLVE 1 TABLET UNDER TONGUE IF NEEDED FOR CHEST PAIN. MAY REPEAT IN 5 MINUTES FOR 3 DOSES.   NON FORMULARY Get steroid inject  for SI joint every 12 wks with Dr. Darlis Leash surgery spine   ondansetron  (ZOFRAN ) 4 mg, Oral, Every 6 hours PRN   pantoprazole  (PROTONIX ) 40 mg, Oral, 2 times daily before meals   Polyethyl Glycol-Propyl Glycol (SYSTANE) 0.4-0.3 % GEL ophthalmic gel 1 Application, Every 4 hours PRN   polyethylene glycol (MIRALAX  / GLYCOLAX ) 17 g, Oral, 2 times daily   rosuvastatin  (CRESTOR ) 10 MG tablet TABLET ONE-HALF TABLET BY MOUTH DAILY   SYNTHROID  50 MCG tablet TAKE 1 TABLET EACH DAY. NEED APPOINTMENT FOR ADDITIONAL REFILLS    Patient Active Problem List   Diagnosis Date Noted   Vomiting and diarrhea 02/08/2024   Primary osteoarthritis of left hip 01/27/2024   S/P total left hip arthroplasty 01/27/2024   Pancreatitis, acute 06/14/2022   Abdominal pain 06/13/2022   Acute pancreatitis 06/13/2022   Hyponatremia 06/13/2022   Gastritis 06/11/2022   Aortic atherosclerosis (HCC) 12/07/2019   Macular degeneration 01/11/2019   Coronary artery disease involving native coronary artery of native heart without angina pectoris 10/29/2018   Presence of intraocular lens 09/23/2018   Bradycardia 11/11/2017   Dyslipidemia 11/11/2017   CAD (coronary artery disease) 06/12/2017   GERD (gastroesophageal reflux disease) 06/12/2017   S/P hip replacement, right 06/12/2017   Osteoarthritis of right hip 06/10/2017   CAD S/P percutaneous coronary angioplasty 02/11/2015   Renal insufficiency  02/11/2015   Prolonged Q-T interval on ECG 04/18/2010   Hypertension 08/11/2007   Hypothyroidism (acquired) 06/08/2007   MITRAL VALVE PROLAPSE 06/08/2007      Review of Systems  Gastrointestinal:  Positive for nausea.  All other systems reviewed and are negative.     Objective:     BP (!) 120/50   Pulse (!) 52   Temp 98 F (36.7 C) (Oral)   Ht 5' 3 (1.6 m)   Wt 124 lb 3.2 oz (56.3 kg)   SpO2 100%   BMI 22.00 kg/m    Physical Exam Vitals reviewed.  Constitutional:      Appearance: Normal appearance.  She is well-groomed and normal weight.  Cardiovascular:     Rate and Rhythm: Normal rate and regular rhythm.     Heart sounds: S1 normal and S2 normal.  Pulmonary:     Effort: Pulmonary effort is normal.     Breath sounds: Normal breath sounds and air entry.  Abdominal:     General: Abdomen is flat. Bowel sounds are normal. There is no distension.     Palpations: Abdomen is soft.     Tenderness: There is no abdominal tenderness. There is no guarding.  Neurological:     Mental Status: She is alert and oriented to person, place, and time. Mental status is at baseline.     Gait: Gait is intact.  Psychiatric:        Mood and Affect: Mood and affect normal.        Speech: Speech normal.        Behavior: Behavior normal.        Judgment: Judgment normal.      No results found for any visits on 02/17/24.    The ASCVD Risk score (Arnett DK, et al., 2019) failed to calculate for the following reasons:   The 2019 ASCVD risk score is only valid for ages 26 to 49   Risk score cannot be calculated because patient has a medical history suggesting prior/existing ASCVD    Assessment & Plan:  Nausea -     Ondansetron  HCl; Take 1 tablet (4 mg total) by mouth every 6 (six) hours as needed for nausea.  Dispense: 20 tablet; Refill: 0  Primary hypertension Assessment & Plan: Current hypertension medications:       Sig   hydrALAZINE  (APRESOLINE ) 10 MG tablet (Taking Differently) TAKE (1) TABLET TWICE A DAY. MAY TAKE EXTRA DOSE FOR SBP >160 UP TO A TOTAL OF 4 TIMES A DAY AS NEEDED    Patient taking differently: Take 10 mg by mouth 4 (four) times daily as needed (SBP >160).   amLODipine  (NORVASC ) 2.5 MG tablet Take 1 tablet (2.5 mg total) by mouth daily.      Chronic, stable. BP is today well controlled on the above medication, will refill her amlodipine  today. Will continue the above medications as prescribed  Orders: -     amLODIPine  Besylate; Take 1 tablet (2.5 mg total) by mouth daily.   Dispense: 90 tablet; Refill: 3  Hyponatremia I spent 30 minutes reviewing the patient's discharge documentation, labs, and medications. Patient needs a follow up BMP to check sodium level today which essentially resolved with IV hydration.   -     Basic metabolic panel with GFR; Future  Gastroesophageal reflux disease without esophagitis Assessment & Plan: Pt's pantoprazole  was increased back to 40 mg BID, she has an appt already scheduled with the GI specialist to figure out why she is  so nauseated.       Return in about 6 months (around 08/19/2024).    Heron [name] Sharper, MD

## 2024-02-17 NOTE — Assessment & Plan Note (Signed)
 Current hypertension medications:       Sig   hydrALAZINE  (APRESOLINE ) 10 MG tablet (Taking Differently) TAKE (1) TABLET TWICE A DAY. MAY TAKE EXTRA DOSE FOR SBP >160 UP TO A TOTAL OF 4 TIMES A DAY AS NEEDED    Patient taking differently: Take 10 mg by mouth 4 (four) times daily as needed (SBP >160).   amLODipine  (NORVASC ) 2.5 MG tablet Take 1 tablet (2.5 mg total) by mouth daily.      Chronic, stable. BP is today well controlled on the above medication, will refill her amlodipine  today. Will continue the above medications as prescribed

## 2024-02-17 NOTE — Assessment & Plan Note (Signed)
 Pt's pantoprazole  was increased back to 40 mg BID, she has an appt already scheduled with the GI specialist to figure out why she is so nauseated.

## 2024-02-18 ENCOUNTER — Ambulatory Visit: Payer: Self-pay | Admitting: Family Medicine

## 2024-02-18 LAB — BASIC METABOLIC PANEL WITH GFR
BUN: 17 mg/dL (ref 6–23)
CO2: 25 meq/L (ref 19–32)
Calcium: 9 mg/dL (ref 8.4–10.5)
Chloride: 98 meq/L (ref 96–112)
Creatinine, Ser: 0.77 mg/dL (ref 0.40–1.20)
GFR: 69.61 mL/min (ref >60.00)
Glucose, Bld: 100 mg/dL — ABNORMAL HIGH (ref 70–99)
Potassium: 4.1 meq/L (ref 3.5–5.1)
Sodium: 132 meq/L — ABNORMAL LOW (ref 135–145)

## 2024-02-19 ENCOUNTER — Telehealth: Payer: Self-pay | Admitting: Physician Assistant

## 2024-02-19 NOTE — Telephone Encounter (Signed)
 Patient called this morning, complaining of somewhat progressive burning epigastric pain. She says she has recently been evaluated in the office, has been on twice daily PPI over the past month and just switched to daily.  She had been on NSAIDs which were stopped as of April 2025. She underwent total hip replacement on 01/26/2024.  Over the past few weeks she has had worsening of the burning epigastric pain and says she is only able to eat incredibly bland food, eating a lot of oatmeal and blueberries. She has been having nausea but without vomiting, has Zofran  to use at home. She is wondering if she could take Mylanta. She says that she did notice dark stool today but had taken prune juice last night and then has had several bowel movements starting with small constipated stools which were darker than the remaining bowel movements. No blood thinners.  Patient advised if she continues to feel poorly she can certainly be evaluated in the emergency room. Advised to go back up to twice daily on PPI and okay to use Mylanta every 4-5 hours as needed.  She has asking to get worked into the office Monday or Tuesday  Patient of Dr. Abran, I think she will need to get scheduled for EGD  Please call patient Monday if she can be worked in with an app, if not perhaps just to be scheduled for EGD Thank you

## 2024-02-20 NOTE — Telephone Encounter (Signed)
 She has not been seen since 06-2022. Looks like she has an appointment with Alan this Wednesday, 02-23-24. She has a history of pancreatitis. In addition to EGD, she may need labs and CT. Dottie, make sure patient is aware of her appointment. Thanks, JP

## 2024-02-21 ENCOUNTER — Ambulatory Visit (INDEPENDENT_AMBULATORY_CARE_PROVIDER_SITE_OTHER): Admitting: Ophthalmology

## 2024-02-21 ENCOUNTER — Encounter (INDEPENDENT_AMBULATORY_CARE_PROVIDER_SITE_OTHER): Payer: Self-pay | Admitting: Ophthalmology

## 2024-02-21 DIAGNOSIS — I10 Essential (primary) hypertension: Secondary | ICD-10-CM | POA: Diagnosis not present

## 2024-02-21 DIAGNOSIS — Z961 Presence of intraocular lens: Secondary | ICD-10-CM | POA: Diagnosis not present

## 2024-02-21 DIAGNOSIS — H353231 Exudative age-related macular degeneration, bilateral, with active choroidal neovascularization: Secondary | ICD-10-CM

## 2024-02-21 DIAGNOSIS — H26492 Other secondary cataract, left eye: Secondary | ICD-10-CM

## 2024-02-21 DIAGNOSIS — H04123 Dry eye syndrome of bilateral lacrimal glands: Secondary | ICD-10-CM

## 2024-02-21 DIAGNOSIS — H353211 Exudative age-related macular degeneration, right eye, with active choroidal neovascularization: Secondary | ICD-10-CM

## 2024-02-21 DIAGNOSIS — H35033 Hypertensive retinopathy, bilateral: Secondary | ICD-10-CM | POA: Diagnosis not present

## 2024-02-21 DIAGNOSIS — H353221 Exudative age-related macular degeneration, left eye, with active choroidal neovascularization: Secondary | ICD-10-CM

## 2024-02-21 MED ORDER — AFLIBERCEPT 2MG/0.05ML IZ SOLN FOR KALEIDOSCOPE
2.0000 mg | INTRAVITREAL | Status: AC | PRN
  Administered 2024-02-21: 2 mg via INTRAVITREAL

## 2024-02-21 NOTE — Telephone Encounter (Signed)
 Spoke to patient to remind her of appointment scheduled with Alan Coombs, PA-C on 02/23/24 and ask that she keep this appointment. Advised she should continue with recommendations from Greig Corti, PA-C given to her over the weekend until her 02/23/24 appointment. Patient verbalizes understanding of this information.

## 2024-02-23 ENCOUNTER — Ambulatory Visit: Payer: Self-pay | Admitting: Physician Assistant

## 2024-02-23 ENCOUNTER — Encounter: Payer: Self-pay | Admitting: Physician Assistant

## 2024-02-23 ENCOUNTER — Ambulatory Visit: Admitting: Physician Assistant

## 2024-02-23 ENCOUNTER — Other Ambulatory Visit (INDEPENDENT_AMBULATORY_CARE_PROVIDER_SITE_OTHER)

## 2024-02-23 VITALS — BP 130/68 | HR 58 | Ht 63.0 in | Wt 123.0 lb

## 2024-02-23 DIAGNOSIS — D649 Anemia, unspecified: Secondary | ICD-10-CM | POA: Diagnosis not present

## 2024-02-23 DIAGNOSIS — E538 Deficiency of other specified B group vitamins: Secondary | ICD-10-CM

## 2024-02-23 DIAGNOSIS — R1013 Epigastric pain: Secondary | ICD-10-CM | POA: Diagnosis not present

## 2024-02-23 DIAGNOSIS — K921 Melena: Secondary | ICD-10-CM

## 2024-02-23 DIAGNOSIS — Z8719 Personal history of other diseases of the digestive system: Secondary | ICD-10-CM

## 2024-02-23 DIAGNOSIS — D509 Iron deficiency anemia, unspecified: Secondary | ICD-10-CM

## 2024-02-23 DIAGNOSIS — K219 Gastro-esophageal reflux disease without esophagitis: Secondary | ICD-10-CM

## 2024-02-23 LAB — CBC WITH DIFFERENTIAL/PLATELET
Basophils Absolute: 0.1 K/uL (ref 0.0–0.1)
Basophils Relative: 1 % (ref 0.0–3.0)
Eosinophils Absolute: 0.2 K/uL (ref 0.0–0.7)
Eosinophils Relative: 4.1 % (ref 0.0–5.0)
HCT: 35 % — ABNORMAL LOW (ref 36.0–46.0)
Hemoglobin: 11.6 g/dL — ABNORMAL LOW (ref 12.0–15.0)
Lymphocytes Relative: 15 % (ref 12.0–46.0)
Lymphs Abs: 0.9 K/uL (ref 0.7–4.0)
MCHC: 33.1 g/dL (ref 30.0–36.0)
MCV: 90.8 fl (ref 78.0–100.0)
Monocytes Absolute: 0.6 K/uL (ref 0.1–1.0)
Monocytes Relative: 10.7 % (ref 3.0–12.0)
Neutro Abs: 4 K/uL (ref 1.4–7.7)
Neutrophils Relative %: 69.2 % (ref 43.0–77.0)
Platelets: 410 K/uL — ABNORMAL HIGH (ref 150.0–400.0)
RBC: 3.85 Mil/uL — ABNORMAL LOW (ref 3.87–5.11)
RDW: 13.9 % (ref 11.5–15.5)
WBC: 5.8 K/uL (ref 4.0–10.5)

## 2024-02-23 LAB — COMPREHENSIVE METABOLIC PANEL WITH GFR
ALT: 8 U/L (ref 0–35)
AST: 15 U/L (ref 0–37)
Albumin: 4 g/dL (ref 3.5–5.2)
Alkaline Phosphatase: 111 U/L (ref 39–117)
BUN: 14 mg/dL (ref 6–23)
CO2: 30 meq/L (ref 19–32)
Calcium: 9 mg/dL (ref 8.4–10.5)
Chloride: 96 meq/L (ref 96–112)
Creatinine, Ser: 0.8 mg/dL (ref 0.40–1.20)
GFR: 66.49 mL/min (ref >60.00)
Glucose, Bld: 95 mg/dL (ref 70–99)
Potassium: 4.3 meq/L (ref 3.5–5.1)
Sodium: 131 meq/L — ABNORMAL LOW (ref 135–145)
Total Bilirubin: 0.3 mg/dL (ref 0.2–1.2)
Total Protein: 6.7 g/dL (ref 6.0–8.3)

## 2024-02-23 LAB — IBC + FERRITIN
Ferritin: 92 ng/mL (ref 10.0–291.0)
Iron: 30 ug/dL — ABNORMAL LOW (ref 42–145)
Saturation Ratios: 9.7 % — ABNORMAL LOW (ref 20.0–50.0)
TIBC: 310.8 ug/dL (ref 250.0–450.0)
Transferrin: 222 mg/dL (ref 212.0–360.0)

## 2024-02-23 LAB — SEDIMENTATION RATE: Sed Rate: 11 mm/h (ref 0–30)

## 2024-02-23 LAB — VITAMIN B12: Vitamin B-12: 124 pg/mL — ABNORMAL LOW (ref 211–911)

## 2024-02-23 LAB — LIPASE: Lipase: 44 U/L (ref 11.0–59.0)

## 2024-02-23 NOTE — Patient Instructions (Addendum)
 _______________________________________________________  If your blood pressure at your visit was 140/90 or greater, please contact your primary care physician to follow up on this.  _______________________________________________________  If you are age 87 or older, your body mass index should be between 23-30. Your Body mass index is 21.79 kg/m. If this is out of the aforementioned range listed, please consider follow up with your Primary Care Provider.  If you are age 83 or younger, your body mass index should be between 19-25. Your Body mass index is 21.79 kg/m. If this is out of the aformentioned range listed, please consider follow up with your Primary Care Provider.   ________________________________________________________  The  GI providers would like to encourage you to use MYCHART to communicate with providers for non-urgent requests or questions.  Due to long hold times on the telephone, sending your provider a message by [facility] may be a faster and more efficient way to get a response.  Please allow 48 business hours for a response.  Please remember that this is for non-urgent requests.  _______________________________________________________  [facility] is using a team-based approach to care.  Your team is made up of your doctor and two to three APPS. Our APPS (Nurse Practitioners and Physician Assistants) work with your physician to ensure care continuity for you. They are fully qualified to address your health concerns and develop a treatment plan. They communicate directly with your gastroenterologist to care for you. Seeing the Advanced Practice Practitioners on your physician's team can help you by facilitating care more promptly, often allowing for earlier appointments, access to diagnostic testing, procedures, and other specialty referrals.    Your provider has requested that you go to the basement level for lab work before leaving today. Press B on the  elevator. The lab is located at the first door on the left as you exit the elevator.  You have been scheduled for an endoscopy. Please follow written instructions given to you at your visit today.  If you use inhalers (even only as needed), please bring them with you on the day of your procedure.  If you take any of the following medications, they will need to be adjusted prior to your procedure:   DO NOT TAKE 7 DAYS PRIOR TO TEST- Trulicity (dulaglutide) Ozempic, Wegovy (semaglutide) Mounjaro (tirzepatide) Bydureon Bcise (exanatide extended release)  DO NOT TAKE 1 DAY PRIOR TO YOUR TEST Rybelsus (semaglutide) Adlyxin (lixisenatide) Victoza (liraglutide) Byetta (exanatide) ___________________________________________________________________________  Due to recent changes in healthcare laws, you may see the results of your imaging and laboratory studies on MyChart before your provider has had a chance to review them.  We understand that in some cases there may be results that are confusing or concerning to you. Not all laboratory results come back in the same time frame and the provider may be waiting for multiple results in order to interpret others.  Please give us  48 hours in order for your provider to thoroughly review all the results before contacting the office for clarification of your results.   Please take your proton pump inhibitor medication, pantoprazole  40 mg TWICE a day  Please take this medication 30 minutes to 1 hour before meals- this makes it more effective.  Avoid spicy and acidic foods Avoid fatty foods Limit your intake of coffee, tea, alcohol , and carbonated drinks Work to maintain a healthy weight Keep the head of the bed elevated at least 3 inches with blocks or a wedge pillow if you are having any nighttime symptoms Stay  upright for 2 hours after eating Avoid meals and snacks three to four hours before bedtime  Gastroparesis Gastroparesis is a condition in  which food takes longer than normal to empty from the stomach.  This condition is also known as delayed gastric emptying. It is usually a long-term (chronic) condition.  What are the signs or symptoms? Symptoms of this condition include: Feeling full after eating very little or a loss of appetite. Nausea, vomiting, or heartburn. Bloating of your abdomen. Inconsistent blood sugar (glucose) levels on blood tests. Unexplained weight loss. Acid from the stomach coming up into the esophagus (gastroesophageal reflux). Sudden tightening (spasm) of the stomach, which can be painful. Symptoms may come and go. Some people may not notice any symptoms.  What increases the risk? You are more likely to develop this condition if: You have certain disorders or diseases. These may include: An endocrine disorder. An eating disorder. Amyloidosis. Scleroderma. Parkinson's disease. Multiple sclerosis. Cancer or infection of the stomach or the vagus nerve. You have had surgery on your stomach or vagus nerve. You take certain medicines. You are female.  Things you can do: Please do small frequent meals like 4-6 meals a day.  Eat and drink liquids at separate times.  Avoid high fiber foods, cook your vegetables, avoid high fat food.  Suggest spreading protein throughout the day (greek yogurt, glucerna, soft meat, milk, eggs) Choose soft foods that you can mash with a fork When you are more symptomatic, change to pureed foods foods and liquids.  Consider reading Living well with Gastroparesis by [facility] Check out this link to a diet online https://my.GroupJournal.fr  Thank you for entrusting me with your care and choosing [facility].  Alan Coombs, PA-C

## 2024-02-23 NOTE — Addendum Note (Signed)
 Addended by: BENJAMINE NAT DEL on: 02/23/2024 04:26 PM   Modules accepted: Orders

## 2024-02-23 NOTE — Progress Notes (Signed)
 02/23/2024 Kaitlyn Parks [id number] 1934-11-26  Referring provider: [name] [name], MD Primary GI doctor: Dr. Abran  ASSESSMENT AND PLAN:  Epigastric pain with history of pancreatitis and GERD Associated nausea, dark stools History of NSAID use stopped April 2025, recent total hip replacement 01/26/2024 PPI once daily, advised after she called with symptoms to increase to twice daily 02/07/2024 CTAP W no acute findings no evidence of pancreatitis symptoms have improved since increasing PPI BID but once she returned to once a day after hip replacement she had issues.  Possible NSAIDS induced gastritis/PUD, gastroparesis secondary to pain medications and recent surgery, rule out other such as stricture/malignancy - continue on protonix  40 mg twice a day -Lifestyle changes discussed, avoid NSAIDS, ETOH, hand out given to the patient -Schedule EGD at [facility] to evaluate GERD, esophagitis, hiatal hernia,H pylori. I discussed risks of EGD with patient today, including risk of sedation, bleeding or perforation. Patient provides understanding and gave verbal consent to proceed. -Consider GES, gastroparesis diet given  Dizziness with nausea Sounds more like vertigo, worse with moving head Check CBC, iron/ferritin/B12  rule out anemia with her symptoms Follow up with PCP ER precautions discussed  Normocytic anemia 02/09/2024  HGB 10.5 MCV 96.5 Platelets 495 Recent Labs    11/30/23 1522 01/27/24 0830 01/28/24 0315 01/29/24 0349 02/07/24 2058 02/08/24 0812 02/09/24 0550  HGB 13.0 12.6 10.9* 9.9* 10.1* 10.8* 10.5*  Check CBC, iron/ferritin/B12  rule out anemia with her symptoms  History of pancreatitis in 2023 with pancreatic divisum and chronic stable pancreatic cyst 02/07/2024 CTAP W no acute findings Will check lipase amylase  Screening colonoscopy Colonoscopy 2007 with Dr. Jakie unremarkable Cologuard negative  CAD status post LAD stent 2012 10/27/2021 - stress test EF 64%  no evidence of ischemia  History of rectocele and uterine prolapse  status post bladder suspension in 2011  Patient Care Team: Kaitlyn Heron [name], MD as PCP - General (Family Medicine) Lavona Agent, MD as PCP - Cardiology (Cardiology) Trixie File, MD as Consulting Physician (Internal Medicine) Mavis Purchase, MD as Consulting Physician (Neurosurgery) Watt Rush, MD as Attending Physician (Urology) Duke, Jon Garre, PA as Physician Assistant (Cardiology)  HISTORY OF PRESENT ILLNESS: 89 y.o. female with a past medical history listed below presents for evaluation of epigastric pain with melena.   Patient was last seen in the office by Dr. Abran 06/30/2022 for posthospital evaluation for mild pancreatitis.  Discussed the use of AI scribe software for clinical note transcription with the patient, who gave verbal consent to proceed.  History of Present Illness   Kaitlyn Parks is an 89 year old female who presents with dizziness and gastrointestinal symptoms. She is accompanied by her daughter, who is her primary caregiver.  She underwent a hip replacement on January 27, 2024. Following the surgery, she experienced episodes of vomiting and dizziness, leading to an emergency room visit on February 02, 2024. The current dizziness began after getting up too quickly from a lying position at 10:00 AM today, described as a sensation of movement contributing to nausea. She took Ondansetron  at 10:00 AM, which she usually takes when feeling slightly nauseous to prevent vomiting.  She has a history of gastrointestinal issues, including dark black stools and epigastric discomfort, which have been lessening. She takes pantoprazole  twice daily, which was increased after a phone call with a PA, helping with abdominal pain and nausea. She has stopped taking Aleve, ibuprofen, and Goodie powders, and now only takes Tylenol  for pain related  to her hip replacement. She also takes a GoodSense replacement for  Maalox occasionally. She has been on Miralax  nightly due to concerns about constipation, which began after her hospital discharge in early July, attributed to reduced food intake and decreased mobility post-surgery.  Her diet is restricted due to a past episode of pancreatitis in December 2023, avoiding red meat, beef, and pork, and sticking to chicken. She also follows an interstitial cystitis diet, avoiding acidic foods to prevent UTIs. She primarily consumes soup, cream potatoes, and minced meat. She has experienced weight loss, currently weighing 123 pounds, down from 127 pounds since April 2025.  She has a history of anemia. No urinary issues or trouble swallowing. Reports a slight burning sensation in her stomach but no significant pain or bloating. She has been taking pantoprazole  since November 18, 2023, initially twice daily, then reduced to once daily, and increased again post-hip replacement due to symptom recurrence.        She  reports that she has never smoked. She has never used smokeless tobacco. She reports that she does not drink alcohol  and does not use drugs.  RELEVANT GI HISTORY, IMAGING AND LABS: Results   LABS Basic Metabolic Panel (BMP): Renal function normal (02/17/2024)  RADIOLOGY Abdominal CT: No pancreatitis, no pancreatic abnormalities, no malignancies, no lymphadenopathy (02/07/2024)      CBC    Component Value Date/Time   WBC 7.7 02/09/2024 0550   RBC 3.44 (L) 02/09/2024 0550   HGB 10.5 (L) 02/09/2024 0550   HCT 33.2 (L) 02/09/2024 0550   PLT 495 (H) 02/09/2024 0550   MCV 96.5 02/09/2024 0550   MCH 30.5 02/09/2024 0550   MCHC 31.6 02/09/2024 0550   RDW 12.7 02/09/2024 0550   LYMPHSABS 0.7 02/08/2024 0812   MONOABS 0.7 02/08/2024 0812   EOSABS 0.1 02/08/2024 0812   BASOSABS 0.0 02/08/2024 0812   Recent Labs    11/30/23 1522 01/27/24 0830 01/28/24 0315 01/29/24 0349 02/07/24 2058 02/08/24 0812 02/09/24 0550  HGB 13.0 12.6 10.9* 9.9* 10.1* 10.8*  10.5*    CMP     Component Value Date/Time   NA 132 (L) 02/17/2024 1535   K 4.1 02/17/2024 1535   CL 98 02/17/2024 1535   CO2 25 02/17/2024 1535   GLUCOSE 100 (H) 02/17/2024 1535   BUN 17 02/17/2024 1535   CREATININE 0.77 02/17/2024 1535   CREATININE 0.77 03/04/2020 0828   CALCIUM  9.0 02/17/2024 1535   CALCIUM  9.0 07/01/2010 2255   PROT 6.0 (L) 02/07/2024 2058   PROT 6.7 02/05/2017 0834   ALBUMIN 3.6 02/07/2024 2058   ALBUMIN 4.4 02/05/2017 0834   AST 22 02/07/2024 2058   ALT 17 02/07/2024 2058   ALKPHOS 97 02/07/2024 2058   BILITOT 0.4 02/07/2024 2058   BILITOT 0.3 02/05/2017 0834   GFRNONAA >60 02/09/2024 0550   GFRAA >60 10/09/2018 0928      Latest Ref Rng & Units 02/07/2024    8:58 PM 10/28/2023   12:09 PM 02/13/2023   10:40 PM  Hepatic Function  Total Protein 6.5 - 8.1 g/dL 6.0  6.2  6.8   Albumin 3.5 - 5.0 g/dL 3.6  3.9  3.9   AST 15 - 41 U/L 22  16  22    ALT 0 - 44 U/L 17  13  16    Alk Phosphatase 38 - 126 U/L 97  61  67   Total Bilirubin 0.0 - 1.2 mg/dL 0.4  0.3  0.3  Current Medications:   Current Outpatient Medications (Endocrine & Metabolic):    SYNTHROID  50 MCG tablet, TAKE 1 TABLET EACH DAY. NEED APPOINTMENT FOR ADDITIONAL REFILLS  Current Outpatient Medications (Cardiovascular):    amLODipine  (NORVASC ) 2.5 MG tablet, Take 1 tablet (2.5 mg total) by mouth daily.   hydrALAZINE  (APRESOLINE ) 10 MG tablet, TAKE (1) TABLET TWICE A DAY. MAY TAKE EXTRA DOSE FOR SBP >160 UP TO A TOTAL OF 4 TIMES A DAY AS NEEDED   isosorbide  mononitrate (IMDUR ) 60 MG 24 hr tablet, TAKE 1 & 1/2 TABLETS by mouth once A DAY.   nitroGLYCERIN  (NITROSTAT ) 0.4 MG SL tablet, DISSOLVE 1 TABLET UNDER TONGUE IF NEEDED FOR CHEST PAIN. MAY REPEAT IN 5 MINUTES FOR 3 DOSES.   rosuvastatin  (CRESTOR ) 10 MG tablet, TABLET ONE-HALF TABLET BY MOUTH DAILY   Current Outpatient Medications (Analgesics):    aspirin  (ASPIRIN  CHILDRENS) 81 MG chewable tablet, Chew 1 tablet (81 mg total) by mouth 2  (two) times daily with a meal.   Current Outpatient Medications (Other):    Aflibercept  (EYLEA  IZ), 1 Dose by Intravitreal route See admin instructions. Every 7 weeks   Biotin w/ Vitamins C & E (HAIR/SKIN/NAILS PO), Take 1 tablet by mouth daily with lunch.   Brimonidine  Tartrate (LUMIFY ) 0.025 % SOLN, Place 1 drop into both eyes every 4 (four) hours as needed (irritated eyes).   Cranberry 400 MG CAPS, Take 400 mg by mouth in the morning.   Lidocaine -Menthol  3.5-7 % PTCH, Place 1 patch onto the skin 2 (two) times daily as needed (knee/hip pain.).   Multiple Vitamins-Calcium  (ONE-A-DAY WOMENS PO), Take 1 tablet by mouth daily with lunch.   NON FORMULARY, Get steroid inject for SI joint every 12 wks with Dr. Darlis Leash surgery spine   ondansetron  (ZOFRAN ) 4 MG tablet, Take 1 tablet (4 mg total) by mouth every 6 (six) hours as needed for nausea.   pantoprazole  (PROTONIX ) 40 MG tablet, Take 1 tablet (40 mg total) by mouth 2 (two) times daily before a meal.   Polyethyl Glycol-Propyl Glycol (SYSTANE) 0.4-0.3 % GEL ophthalmic gel, Place 1 Application into both eyes every 4 (four) hours as needed (dry eyes).   polyethylene glycol (MIRALAX  / GLYCOLAX ) 17 g packet, Take 17 g by mouth 2 (two) times daily.  Medical History:  Past Medical History:  Diagnosis Date   Arthritis    DDD, scoliosis, sees Dr. Mavis for this, uses norco very rarely for pain   Arthritis    lumbar stenosis, gets steroid injections for [facility]   Bradycardia 11/11/2017   CAD (coronary artery disease)    LAD stenting of a 90% lesion 2012   Cystocele    Educated about COVID-19 virus infection 12/06/2019   Elevated cholesterol    GERD (gastroesophageal reflux disease)    dx in work up 2016 for atypical CP at OSH   pt. denies   Heart murmur    Hypertensive retinopathy    OU   Hypothyroidism    Interstitial cystitis    sees Dr. Watt   Macular degeneration    OU   Neuromuscular disorder [facility])    neuropathy    NSTEMI (non-ST elevated myocardial infarction) (HCC)    Osteoporosis    Rectocele    S/P hip replacement, right 06/12/2017   Scoliosis    Thyroid  disease    Hypothyroid   Urinary incontinence    USI   Uterine prolapse    Allergies:  Allergies  Allergen Reactions   Acyclovir And  Related Other (See Comments)    unknown   Ciprofloxacin  Other (See Comments)    dizziness   Pravachol  [Pravastatin  Sodium] Other (See Comments)    cystitis   Sulfa  Antibiotics     nausea   Zocor  [Simvastatin ] Other (See Comments)    cystitis     Surgical History:  She  has a past surgical history that includes Vaginal hysterectomy (2011); Oophorectomy (2011); Bladder suspension (2011); Coronary angioplasty with stent (04/21/2010); Total hip arthroplasty (Right, 06/10/2017); LEFT HEART CATH AND CORONARY ANGIOGRAPHY (N/A, 06/14/2017); Cataract extraction (Bilateral, 2015); Eye surgery; and Total hip arthroplasty (Left, 01/27/2024). Family History:  Her family history includes Arthritis in her daughter; Breast cancer in her cousin, paternal aunt, and paternal aunt; Colon cancer (age of onset: 66) in her paternal aunt; Diabetes in her sister; Heart disease in her brother, father, mother, and sister; Hypertension in her mother; Leukemia in her paternal aunt; Lung cancer in her paternal grandfather and sister; Scoliosis in her sister; Uterine cancer in her sister.  REVIEW OF SYSTEMS  : All other systems reviewed and negative except where noted in the History of Present Illness.  PHYSICAL EXAM: BP 130/68   Pulse (!) 58   Ht 5' 3 (1.6 m)   Wt 123 lb (55.8 kg)   BMI 21.79 kg/m  Physical Exam   MEASUREMENTS: Weight- 123. GENERAL APPEARANCE: Well nourished, in no apparent distress. HEENT: No cervical lymphadenopathy, unremarkable thyroid , sclerae anicteric, conjunctiva pink. RESPIRATORY: Respiratory effort normal, breath sounds clear to auscultation bilaterally without rales, rhonchi, or wheezing. CARDIO: RRR  with no MRGs, peripheral pulses intact. ABDOMEN: Soft, non-distended, active bowel sounds in all 4 quadrants, no tenderness to palpation, no rebound, no mass appreciated. RECTAL: Declines. MUSCULOSKELETAL: Full ROM, normal gait, without edema. SKIN: Dry, intact without rashes or lesions. No jaundice. NEURO: Alert, oriented, no focal deficits. PSYCH: Cooperative, normal mood and affect.      Alan JONELLE Coombs, PA-C 2:22 PM

## 2024-02-23 NOTE — Progress Notes (Signed)
 Noted

## 2024-02-29 ENCOUNTER — Telehealth: Payer: Self-pay | Admitting: Physician Assistant

## 2024-02-29 NOTE — Telephone Encounter (Signed)
 Patient returned call. Patient wanted to know if she was to go ahead and start B12 and iron supplement that was recommended based on recent labs. Patient has been advised to go ahead and purchase supplements over the counter - B12 sublingual 1000 mcg daily and iron 325 mg every other day. Hematology will manage infusions after consult. Patient will discuss any other concerns with Dr. Abran at the time of her EGD later this week.

## 2024-02-29 NOTE — Telephone Encounter (Signed)
 Lm on vm for patient to return call

## 2024-02-29 NOTE — Telephone Encounter (Signed)
 Patient called and stated that she is having a procedure done on August the 1 st. Patient stated that she is not sure that she should be taking the Vitamin D3 and the iron supplement before her procedure. Patient is requesting that nurse return her call. Please advise.

## 2024-03-03 ENCOUNTER — Other Ambulatory Visit: Payer: Self-pay | Admitting: *Deleted

## 2024-03-03 ENCOUNTER — Ambulatory Visit (AMBULATORY_SURGERY_CENTER): Admitting: Internal Medicine

## 2024-03-03 ENCOUNTER — Encounter: Payer: Self-pay | Admitting: Internal Medicine

## 2024-03-03 VITALS — BP 147/76 | HR 71 | Temp 98.2°F | Resp 17 | Ht 63.0 in | Wt 123.0 lb

## 2024-03-03 DIAGNOSIS — R1013 Epigastric pain: Secondary | ICD-10-CM

## 2024-03-03 DIAGNOSIS — K921 Melena: Secondary | ICD-10-CM

## 2024-03-03 DIAGNOSIS — K2940 Chronic atrophic gastritis without bleeding: Secondary | ICD-10-CM

## 2024-03-03 DIAGNOSIS — K279 Peptic ulcer, site unspecified, unspecified as acute or chronic, without hemorrhage or perforation: Secondary | ICD-10-CM

## 2024-03-03 DIAGNOSIS — E538 Deficiency of other specified B group vitamins: Secondary | ICD-10-CM | POA: Diagnosis not present

## 2024-03-03 DIAGNOSIS — D509 Iron deficiency anemia, unspecified: Secondary | ICD-10-CM

## 2024-03-03 DIAGNOSIS — K219 Gastro-esophageal reflux disease without esophagitis: Secondary | ICD-10-CM

## 2024-03-03 MED ORDER — PANTOPRAZOLE SODIUM 40 MG PO TBEC
40.0000 mg | DELAYED_RELEASE_TABLET | Freq: Every day | ORAL | 1 refills | Status: AC
Start: 2024-03-03 — End: ?

## 2024-03-03 MED ORDER — SODIUM CHLORIDE 0.9 % IV SOLN: 500.0000 mL | Freq: Once | INTRAVENOUS | Status: DC

## 2024-03-03 NOTE — Progress Notes (Signed)
 Pt's states no medical or surgical changes since previsit or office visit.

## 2024-03-03 NOTE — Progress Notes (Signed)
 Expand All Collapse All        02/23/2024 Kaitlyn Parks [id number] March 25, 1935   Referring provider: [name] [name], MD Primary GI doctor: Dr. Abran   ASSESSMENT AND PLAN:  Epigastric pain with history of pancreatitis and GERD Associated nausea, dark stools History of NSAID use stopped April 2025, recent total hip replacement 01/26/2024 PPI once daily, advised after she called with symptoms to increase to twice daily 02/07/2024 CTAP W no acute findings no evidence of pancreatitis symptoms have improved since increasing PPI BID but once she returned to once a day after hip replacement she had issues.  Possible NSAIDS induced gastritis/PUD, gastroparesis secondary to pain medications and recent surgery, rule out other such as stricture/malignancy - continue on protonix  40 mg twice a day -Lifestyle changes discussed, avoid NSAIDS, ETOH, hand out given to the patient -Schedule EGD at [facility] to evaluate GERD, esophagitis, hiatal hernia,H pylori. I discussed risks of EGD with patient today, including risk of sedation, bleeding or perforation. Patient provides understanding and gave verbal consent to proceed. -Consider GES, gastroparesis diet given   Dizziness with nausea Sounds more like vertigo, worse with moving head Check CBC, iron/ferritin/B12  rule out anemia with her symptoms Follow up with PCP ER precautions discussed   Normocytic anemia 02/09/2024  HGB 10.5 MCV 96.5 Platelets 495 Recent Labs (within last 365 days)           Recent Labs    11/30/23 1522 01/27/24 0830 01/28/24 0315 01/29/24 0349 02/07/24 2058 02/08/24 0812 02/09/24 0550  HGB 13.0 12.6 10.9* 9.9* 10.1* 10.8* 10.5*    Check CBC, iron/ferritin/B12  rule out anemia with her symptoms   History of pancreatitis in 2023 with pancreatic divisum and chronic stable pancreatic cyst 02/07/2024 CTAP W no acute findings Will check lipase amylase   Screening colonoscopy Colonoscopy 2007 with Dr. Jakie  unremarkable Cologuard negative   CAD status post LAD stent 2012 10/27/2021 - stress test EF 64% no evidence of ischemia   History of rectocele and uterine prolapse  status post bladder suspension in 2011   Patient Care Team: [name] [name], MD as PCP - General (Family Medicine) Lavona Agent, MD as PCP - Cardiology (Cardiology) Trixie File, MD as Consulting Physician (Internal Medicine) Mavis Purchase, MD as Consulting Physician (Neurosurgery) Watt Rush, MD as Attending Physician (Urology) Duke, Jon Garre, PA as Physician Assistant (Cardiology)   HISTORY OF PRESENT ILLNESS: 89 y.o. female with a past medical history listed below presents for evaluation of epigastric pain with melena.    Patient was last seen in the office by Dr. Abran 06/30/2022 for posthospital evaluation for mild pancreatitis.   Discussed the use of AI scribe software for clinical note transcription with the patient, who gave verbal consent to proceed.   History of Present Illness   Kaitlyn Parks is an 89 year old female who presents with dizziness and gastrointestinal symptoms. She is accompanied by her daughter, who is her primary caregiver.   She underwent a hip replacement on January 27, 2024. Following the surgery, she experienced episodes of vomiting and dizziness, leading to an emergency room visit on February 02, 2024. The current dizziness began after getting up too quickly from a lying position at 10:00 AM today, described as a sensation of movement contributing to nausea. She took Ondansetron  at 10:00 AM, which she usually takes when feeling slightly nauseous to prevent vomiting.   She has a history of gastrointestinal issues, including dark black stools and epigastric discomfort, which  have been lessening. She takes pantoprazole  twice daily, which was increased after a phone call with a PA, helping with abdominal pain and nausea. She has stopped taking Aleve, ibuprofen, and Goodie powders, and  now only takes Tylenol  for pain related to her hip replacement. She also takes a GoodSense replacement for Maalox occasionally. She has been on Miralax  nightly due to concerns about constipation, which began after her hospital discharge in early July, attributed to reduced food intake and decreased mobility post-surgery.   Her diet is restricted due to a past episode of pancreatitis in December 2023, avoiding red meat, beef, and pork, and sticking to chicken. She also follows an interstitial cystitis diet, avoiding acidic foods to prevent UTIs. She primarily consumes soup, cream potatoes, and minced meat. She has experienced weight loss, currently weighing 123 pounds, down from 127 pounds since April 2025.   She has a history of anemia. No urinary issues or trouble swallowing. Reports a slight burning sensation in her stomach but no significant pain or bloating. She has been taking pantoprazole  since November 18, 2023, initially twice daily, then reduced to once daily, and increased again post-hip replacement due to symptom recurrence.           She  reports that she has never smoked. She has never used smokeless tobacco. She reports that she does not drink alcohol  and does not use drugs.   RELEVANT GI HISTORY, IMAGING AND LABS: Results   LABS Basic Metabolic Panel (BMP): Renal function normal (02/17/2024)   RADIOLOGY Abdominal CT: No pancreatitis, no pancreatic abnormalities, no malignancies, no lymphadenopathy (02/07/2024)       CBC Labs (Brief)          Component Value Date/Time    WBC 7.7 02/09/2024 0550    RBC 3.44 (L) 02/09/2024 0550    HGB 10.5 (L) 02/09/2024 0550    HCT 33.2 (L) 02/09/2024 0550    PLT 495 (H) 02/09/2024 0550    MCV 96.5 02/09/2024 0550    MCH 30.5 02/09/2024 0550    MCHC 31.6 02/09/2024 0550    RDW 12.7 02/09/2024 0550    LYMPHSABS 0.7 02/08/2024 0812    MONOABS 0.7 02/08/2024 0812    EOSABS 0.1 02/08/2024 0812    BASOSABS 0.0 02/08/2024 0812      Recent  Labs (within last 365 days)           Recent Labs    11/30/23 1522 01/27/24 0830 01/28/24 0315 01/29/24 0349 02/07/24 2058 02/08/24 0812 02/09/24 0550  HGB 13.0 12.6 10.9* 9.9* 10.1* 10.8* 10.5*        CMP     Labs (Brief)          Component Value Date/Time    NA 132 (L) 02/17/2024 1535    K 4.1 02/17/2024 1535    CL 98 02/17/2024 1535    CO2 25 02/17/2024 1535    GLUCOSE 100 (H) 02/17/2024 1535    BUN 17 02/17/2024 1535    CREATININE 0.77 02/17/2024 1535    CREATININE 0.77 03/04/2020 0828    CALCIUM  9.0 02/17/2024 1535    CALCIUM  9.0 07/01/2010 2255    PROT 6.0 (L) 02/07/2024 2058    PROT 6.7 02/05/2017 0834    ALBUMIN 3.6 02/07/2024 2058    ALBUMIN 4.4 02/05/2017 0834    AST 22 02/07/2024 2058    ALT 17 02/07/2024 2058    ALKPHOS 97 02/07/2024 2058    BILITOT 0.4 02/07/2024 2058    BILITOT 0.3  02/05/2017 0834    GFRNONAA >60 02/09/2024 0550    GFRAA >60 10/09/2018 0928          Latest Ref Rng & Units 02/07/2024    8:58 PM 10/28/2023   12:09 PM 02/13/2023   10:40 PM  Hepatic Function  Total Protein 6.5 - 8.1 g/dL 6.0  6.2  6.8   Albumin 3.5 - 5.0 g/dL 3.6  3.9  3.9   AST 15 - 41 U/L 22  16  22    ALT 0 - 44 U/L 17  13  16    Alk Phosphatase 38 - 126 U/L 97  61  67   Total Bilirubin 0.0 - 1.2 mg/dL 0.4  0.3  0.3       Current Medications:    Current Outpatient Medications (Endocrine & Metabolic):    SYNTHROID  50 MCG tablet, TAKE 1 TABLET EACH DAY. NEED APPOINTMENT FOR ADDITIONAL REFILLS   Current Outpatient Medications (Cardiovascular):    amLODipine  (NORVASC ) 2.5 MG tablet, Take 1 tablet (2.5 mg total) by mouth daily.   hydrALAZINE  (APRESOLINE ) 10 MG tablet, TAKE (1) TABLET TWICE A DAY. MAY TAKE EXTRA DOSE FOR SBP >160 UP TO A TOTAL OF 4 TIMES A DAY AS NEEDED   isosorbide  mononitrate (IMDUR ) 60 MG 24 hr tablet, TAKE 1 & 1/2 TABLETS by mouth once A DAY.   nitroGLYCERIN  (NITROSTAT ) 0.4 MG SL tablet, DISSOLVE 1 TABLET UNDER TONGUE IF NEEDED FOR CHEST PAIN.  MAY REPEAT IN 5 MINUTES FOR 3 DOSES.   rosuvastatin  (CRESTOR ) 10 MG tablet, TABLET ONE-HALF TABLET BY MOUTH DAILY     Current Outpatient Medications (Analgesics):    aspirin  (ASPIRIN  CHILDRENS) 81 MG chewable tablet, Chew 1 tablet (81 mg total) by mouth 2 (two) times daily with a meal.     Current Outpatient Medications (Other):    Aflibercept  (EYLEA  IZ), 1 Dose by Intravitreal route See admin instructions. Every 7 weeks   Biotin w/ Vitamins C & E (HAIR/SKIN/NAILS PO), Take 1 tablet by mouth daily with lunch.   Brimonidine  Tartrate (LUMIFY ) 0.025 % SOLN, Place 1 drop into both eyes every 4 (four) hours as needed (irritated eyes).   Cranberry 400 MG CAPS, Take 400 mg by mouth in the morning.   Lidocaine -Menthol  3.5-7 % PTCH, Place 1 patch onto the skin 2 (two) times daily as needed (knee/hip pain.).   Multiple Vitamins-Calcium  (ONE-A-DAY WOMENS PO), Take 1 tablet by mouth daily with lunch.   NON FORMULARY, Get steroid inject for SI joint every 12 wks with Dr. Darlis Leash surgery spine   ondansetron  (ZOFRAN ) 4 MG tablet, Take 1 tablet (4 mg total) by mouth every 6 (six) hours as needed for nausea.   pantoprazole  (PROTONIX ) 40 MG tablet, Take 1 tablet (40 mg total) by mouth 2 (two) times daily before a meal.   Polyethyl Glycol-Propyl Glycol (SYSTANE) 0.4-0.3 % GEL ophthalmic gel, Place 1 Application into both eyes every 4 (four) hours as needed (dry eyes).   polyethylene glycol (MIRALAX  / GLYCOLAX ) 17 g packet, Take 17 g by mouth 2 (two) times daily.   Medical History:      Past Medical History:  Diagnosis Date   Arthritis      DDD, scoliosis, sees Dr. Mavis for this, uses norco very rarely for pain   Arthritis      lumbar stenosis, gets steroid injections for [place] [facility]   Bradycardia 11/11/2017   CAD (coronary artery disease)      LAD stenting of a 90% lesion 2012  Cystocele     Educated about COVID-19 virus infection 12/06/2019   Elevated cholesterol     GERD  (gastroesophageal reflux disease)      dx in work up 2016 for atypical CP at OSH   pt. denies   Heart murmur     Hypertensive retinopathy      OU   Hypothyroidism     Interstitial cystitis      sees Dr. Watt   Macular degeneration      OU   Neuromuscular disorder [facility])      neuropathy   NSTEMI (non-ST elevated myocardial infarction) [facility])     Osteoporosis     Rectocele     S/P hip replacement, right 06/12/2017   Scoliosis     Thyroid  disease      Hypothyroid   Urinary incontinence      USI   Uterine prolapse          Allergies:  Allergies       Allergies  Allergen Reactions   Acyclovir And Related Other (See Comments)      unknown   Ciprofloxacin  Other (See Comments)      dizziness   Pravachol  [Pravastatin  Sodium] Other (See Comments)      cystitis   Sulfa  Antibiotics        nausea   Zocor  [Simvastatin ] Other (See Comments)      cystitis        Surgical History:  She  has a past surgical history that includes Vaginal hysterectomy (2011); Oophorectomy (2011); Bladder suspension (2011); Coronary angioplasty with stent (04/21/2010); Total hip arthroplasty (Right, 06/10/2017); LEFT HEART CATH AND CORONARY ANGIOGRAPHY (N/A, 06/14/2017); Cataract extraction (Bilateral, 2015); Eye surgery; and Total hip arthroplasty (Left, 01/27/2024). Family History:  Her family history includes Arthritis in her daughter; Breast cancer in her cousin, paternal aunt, and paternal aunt; Colon cancer (age of onset: 25) in her paternal aunt; Diabetes in her sister; Heart disease in her brother, father, mother, and sister; Hypertension in her mother; Leukemia in her paternal aunt; Lung cancer in her paternal grandfather and sister; Scoliosis in her sister; Uterine cancer in her sister.   REVIEW OF SYSTEMS  : All other systems reviewed and negative except where noted in the History of Present Illness.   PHYSICAL EXAM: BP 130/68   Pulse (!) 58   Ht 5' 3 (1.6 m)   Wt 123 lb (55.8 kg)   BMI  21.79 kg/m  Physical Exam   MEASUREMENTS: Weight- 123. GENERAL APPEARANCE: Well nourished, in no apparent distress. HEENT: No cervical lymphadenopathy, unremarkable thyroid , sclerae anicteric, conjunctiva pink. RESPIRATORY: Respiratory effort normal, breath sounds clear to auscultation bilaterally without rales, rhonchi, or wheezing. CARDIO: RRR with no MRGs, peripheral pulses intact. ABDOMEN: Soft, non-distended, active bowel sounds in all 4 quadrants, no tenderness to palpation, no rebound, no mass appreciated. RECTAL: Declines. MUSCULOSKELETAL: Full ROM, normal gait, without edema. SKIN: Dry, intact without rashes or lesions. No jaundice. NEURO: Alert, oriented, no focal deficits. PSYCH: Cooperative, normal mood and affect.       Alan JONELLE Coombs, PA-C 2:22 PM

## 2024-03-03 NOTE — Op Note (Signed)
 [facility] Patient Name: Kaitlyn Parks Procedure Date: 03/03/2024 9:28 AM MRN: [id number] Endoscopist: Norleen SAILOR. Abran , MD, [id number] Age: 89 Referring MD:  Date of Birth: 1935-02-10 Gender: Female Account #: [id number] Procedure:                Upper GI endoscopy Indications:              Abdominal pain, Esophageal reflux, dark stools,                            anemia with B12 deficiency and questionable iron                            deficiency Medicines:                Monitored Anesthesia Care Procedure:                Pre-Anesthesia Assessment:                           - Prior to the procedure, a History and Physical                            was performed, and patient medications and                            allergies were reviewed. The patient's tolerance of                            previous anesthesia was also reviewed. The risks                            and benefits of the procedure and the sedation                            options and risks were discussed with the patient.                            All questions were answered, and informed consent                            was obtained. Prior Anticoagulants: The patient has                            taken no anticoagulant or antiplatelet agents. ASA                            Grade Assessment: III - A patient with severe                            systemic disease. After reviewing the risks and                            benefits, the patient was deemed in satisfactory  condition to undergo the procedure.                           After obtaining informed consent, the endoscope was                            passed under direct vision. Throughout the                            procedure, the patient's blood pressure, pulse, and                            oxygen saturations were monitored continuously. The                            Endoscope was introduced through the mouth,  and                            advanced to the third part of duodenum. The upper                            GI endoscopy was accomplished without difficulty.                            The patient tolerated the procedure well. Scope In: Scope Out: Findings:                 The esophagus was normal.                           The stomach diffuse atrophic mucosa consistent with                            atrophic gastritis.                           The examined duodenum was normal.                           The cardia and gastric fundus were normal on                            retroflexion. Complications:            No immediate complications. Estimated Blood Loss:     Estimated blood loss: none. Impression:               1. Diffusely atrophic mucosa consistent with                            atrophic gastritis associated with B12 deficiency                           2. Otherwise unremarkable EGD. Recommendation:           - Patient has a contact number available for  emergencies. The signs and symptoms of potential                            delayed complications were discussed with the                            patient. Return to normal activities tomorrow.                            Written discharge instructions were provided to the                            patient.                           - Resume previous diet. Okay to liberalize diet as                            tolerated                           - Continue present medications.                           - Decrease pantoprazole  to once daily                           - Follow-up with hematology regarding management of                            B12 deficiency                           - Resume general medical care with your PCP Norleen SAILOR. Abran, MD 03/03/2024 9:53:17 AM This report has been signed electronically.

## 2024-03-03 NOTE — Progress Notes (Signed)
 Pt sedate, gd SR's, VSS, report to RN

## 2024-03-03 NOTE — Patient Instructions (Signed)
 DECREASE pantoprazole  to once daily.  Follow-up with hematology regarding management of B12 deficiency.  YOU HAD AN ENDOSCOPIC PROCEDURE TODAY AT THE [facility]:   Refer to the procedure report that was given to you for any specific questions about what was found during the examination.  If the procedure report does not answer your questions, please call your gastroenterologist to clarify.  If you requested that your care partner not be given the details of your procedure findings, then the procedure report has been included in a sealed envelope for you to review at your convenience later.  YOU SHOULD EXPECT: Some feelings of bloating in the abdomen. Passage of more gas than usual.  Walking can help get rid of the air that was put into your GI tract during the procedure and reduce the bloating. If you had a lower endoscopy (such as a colonoscopy or flexible sigmoidoscopy) you may notice spotting of blood in your stool or on the toilet paper. If you underwent a bowel prep for your procedure, you may not have a normal bowel movement for a few days.  Please Note:  You might notice some irritation and congestion in your nose or some drainage.  This is from the oxygen used during your procedure.  There is no need for concern and it should clear up in a day or so.  SYMPTOMS TO REPORT IMMEDIATELY:  Following upper endoscopy (EGD)  Vomiting of blood or coffee ground material  New chest pain or pain under the shoulder blades  Painful or persistently difficult swallowing  New shortness of breath  Fever of 100F or higher  Black, tarry-looking stools  For urgent or emergent issues, a gastroenterologist can be reached at any hour by calling [phone]. Do not use MyChart messaging for urgent concerns.    DIET:  We do recommend a small meal at first, but then you may proceed to your regular diet.  Drink plenty of fluids but you should avoid alcoholic beverages for 24 hours.  ACTIVITY:   You should plan to take it easy for the rest of today and you should NOT DRIVE or use heavy machinery until tomorrow (because of the sedation medicines used during the test).    FOLLOW UP: Our staff will call the number listed on your records the next business day following your procedure.  We will call around 7:15- 8:00 am to check on you and address any questions or concerns that you may have regarding the information given to you following your procedure. If we do not reach you, we will leave a message.     If any biopsies were taken you will be contacted by phone or by letter within the next 1-3 weeks.  Please call us  at [phone] if you have not heard about the biopsies in 3 weeks.    SIGNATURES/CONFIDENTIALITY: You and/or your care partner have signed paperwork which will be entered into your electronic medical record.  These signatures attest to the fact that that the information above on your After Visit Summary has been reviewed and is understood.  Full responsibility of the confidentiality of this discharge information lies with you and/or your care-partner.

## 2024-03-06 ENCOUNTER — Telehealth: Payer: Self-pay | Admitting: Lactation Services

## 2024-03-06 NOTE — Telephone Encounter (Signed)
  Follow up Call-     03/03/2024    8:14 AM  Call back number  Post procedure Call Back phone  # [phone]  Permission to leave phone message Yes     Patient questions:  Do you have a fever, pain , or abdominal swelling? No. Pain Score  0 *  Have you tolerated food without any problems? Yes.    Have you been able to return to your normal activities? Yes.    Do you have any questions about your discharge instructions: Diet   No. Medications  No. Follow up visit  No.  Do you have questions or concerns about your Care? No.  Actions: * If pain score is 4 or above: No action needed, pain <4.

## 2024-03-08 ENCOUNTER — Telehealth: Payer: Self-pay | Admitting: *Deleted

## 2024-03-08 NOTE — Telephone Encounter (Signed)
 Noted- ok to close.

## 2024-03-08 NOTE — Telephone Encounter (Signed)
 Copied from CRM #[id number]. Topic: General - Other >> Mar 08, 2024 10:15 AM [place] E wrote: Reason for CRM: Charisse from [facility] called just wanting to make patient's PCP aware that they are discharging her from physical therapy today, 8/6. Callback number for [place] is [phone].

## 2024-04-04 ENCOUNTER — Ambulatory Visit: Admitting: Physician Assistant

## 2024-04-05 NOTE — Progress Notes (Signed)
 [facility] - Clinic Note  04/17/2024    CHIEF COMPLAINT Patient presents for Retina Follow Up  HISTORY OF PRESENT ILLNESS: Kaitlyn Parks is a 89 y.o. female who presents to the clinic today for:   HPI     Retina Follow Up   Patient presents with  Wet AMD.  In left eye.  Severity is moderate.  Duration of 8 weeks.  Since onset it is stable.  I, the attending physician,  performed the HPI with the patient and updated documentation appropriately.        Comments   8 week Retina eval Patient states vision seems the same      Last edited by Valdemar Rogue, MD on 04/18/2024 12:50 AM.      Referring physician: [name] [name], MD [address] [place],  [place] [address]  HISTORICAL INFORMATION:  Selected notes from the MEDICAL RECORD NUMBER Referred by Dr. Jordan DeMarco for concern of exu ARMD LEE: 11.26.19 (J. 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 Every 7 weeks   Brimonidine  Tartrate (LUMIFY ) 0.025 % SOLN Place 1 drop into both eyes every 4 (four) hours as needed (irritated eyes).   No current facility-administered medications for this visit. (Ophthalmic Drugs)   Current Outpatient Medications (Other)  Medication Sig   amLODipine  (NORVASC ) 2.5 MG tablet Take 1 tablet (2.5 mg total) by mouth daily.   Biotin w/ Vitamins C & E (HAIR/SKIN/NAILS PO) Take 1 tablet by mouth daily with lunch.   Cranberry 400 MG CAPS Take 400 mg by mouth in the morning.   hydrALAZINE  (APRESOLINE ) 10 MG tablet TAKE (1) TABLET TWICE A DAY. MAY TAKE EXTRA DOSE FOR SBP >160 UP TO A TOTAL OF 4 TIMES A DAY AS NEEDED   Lidocaine -Menthol  3.5-7 % PTCH Place 1 patch onto the skin 2 (two) times daily as needed  (knee/hip pain.).   Multiple Vitamins-Calcium  (ONE-A-DAY WOMENS PO) Take 1 tablet by mouth daily with lunch.   nitroGLYCERIN  (NITROSTAT ) 0.4 MG SL tablet DISSOLVE 1 TABLET UNDER TONGUE IF NEEDED FOR CHEST PAIN. MAY REPEAT IN 5 MINUTES FOR 3 DOSES.   ondansetron  (ZOFRAN ) 4 MG tablet Take 1 tablet (4 mg total) by mouth every 6 (six) hours as needed for nausea.   pantoprazole  (PROTONIX ) 40 MG tablet Take 1 tablet (40 mg total) by mouth daily.   rosuvastatin  (CRESTOR ) 10 MG tablet TABLET ONE-HALF TABLET BY MOUTH DAILY   SYNTHROID  50 MCG tablet TAKE 1 TABLET EACH DAY. NEED APPOINTMENT FOR ADDITIONAL REFILLS   isosorbide  mononitrate (IMDUR ) 60 MG 24 hr tablet TAKE 1 & 1/2 TABLETS by mouth once A DAY.   NON FORMULARY Get steroid inject for SI joint every 12 wks with Dr. Darlis Leash surgery spine (Patient not taking: Reported on 04/06/2024)   No current facility-administered medications for this visit. (Other)   REVIEW OF SYSTEMS: ROS   Positive for: Gastrointestinal, Genitourinary, Musculoskeletal, Cardiovascular, Eyes Negative for: Constitutional, Neurological, Skin, HENT, Endocrine, Respiratory, Psychiatric, Allergic/Imm, Heme/Lymph Last edited by German Olam BRAVO, COT on 04/17/2024  1:16 PM.     ALLERGIES Allergies  Allergen Reactions   Acyclovir And Related Other (See Comments)    unknown   Ciprofloxacin  Other (See Comments)    dizziness   Pravachol  [Pravastatin  Sodium]  Other (See Comments)    cystitis   Sulfa  Antibiotics Nausea Only    nausea   Zocor  [Simvastatin ] Other (See Comments)    cystitis   PAST MEDICAL HISTORY Past Medical History:  Diagnosis Date   Arthritis    DDD, scoliosis, sees Dr. Mavis for this, uses norco very rarely for pain   Arthritis    lumbar stenosis, gets steroid injections for [facility]   Bradycardia 11/11/2017   CAD (coronary artery disease)    LAD stenting of a 90% lesion 2012   Cystocele    Educated about COVID-19 virus infection 12/06/2019    Elevated cholesterol    GERD (gastroesophageal reflux disease)    dx in work up 2016 for atypical CP at OSH   pt. denies   Heart murmur    Hypertensive retinopathy    OU   Hypothyroidism    Interstitial cystitis    sees Dr. Watt   Macular degeneration    OU   Neuromuscular disorder [facility])    neuropathy   NSTEMI (non-ST elevated myocardial infarction) (HCC)    Osteoporosis    Rectocele    S/P hip replacement, right 06/12/2017   Scoliosis    Thyroid  disease    Hypothyroid   Urinary incontinence    USI   Uterine prolapse    Past Surgical History:  Procedure Laterality Date   BLADDER SUSPENSION  2011   CATARACT EXTRACTION Bilateral 2015   Dr. Cleatus   CORONARY ANGIOPLASTY WITH STENT PLACEMENT  04/21/2010   LAD 80%, RCA 30%, nl EF, s/p DES LAD   EYE SURGERY     LEFT HEART CATH AND CORONARY ANGIOGRAPHY N/A 06/14/2017   Procedure: LEFT HEART CATH AND CORONARY ANGIOGRAPHY;  Surgeon: Court Dorn PARAS, MD;  Location: [facility] INVASIVE [facility];  Service: Cardiovascular;  Laterality: N/A;   OOPHORECTOMY  2011   BSO   TOTAL HIP ARTHROPLASTY Right 06/10/2017   Procedure: RIGHT TOTAL HIP ARTHROPLASTY ANTERIOR APPROACH;  Surgeon: Fidel Rogue, MD;  Location: [facility] ORS;  Service: Orthopedics;  Laterality: Right;  Needs RNFA   TOTAL HIP ARTHROPLASTY Left 01/27/2024   Procedure: ARTHROPLASTY, HIP, TOTAL, ANTERIOR APPROACH;  Surgeon: Fidel Rogue, MD;  Location: [facility] OR[facility];  Service: Orthopedics;  Laterality: Left;   VAGINAL HYSTERECTOMY  2011   LAVH BSO; benign   FAMILY HISTORY Family History  Problem Relation Age of Onset   Hypertension Mother    Heart disease Mother    Heart disease Father    Heart disease Sister    Diabetes Sister    Uterine cancer Sister        mets to lungs   Lung cancer Sister    Scoliosis Sister    Heart disease Brother    Colon cancer Paternal Aunt 29   Breast cancer Paternal Aunt        Age 67's   Breast cancer Paternal Aunt    Leukemia Paternal Aunt     Lung cancer Paternal Grandfather        smoker   Arthritis Daughter    Breast cancer Cousin        Maternal 1st cousins-Age 66's   Esophageal cancer Neg Hx    Pancreatic cancer Neg Hx    Stomach cancer Neg Hx    SOCIAL HISTORY Social History   Tobacco Use   Smoking status: Never   Smokeless tobacco: Never  Vaping Use   Vaping status: Never Used  Substance Use Topics   Alcohol  use: No  Comment: Rare   Drug use: No       OPHTHALMIC EXAM: Base Eye Exam     Visual Acuity (Snellen - Linear)       Right Left   Dist [place] 20/30 -2 20/30 -3   Dist ph  20/NI 20NI    Correction: Glasses         Tonometry (Tonopen, 1:21 PM)       Right Left   Pressure 11 10         Pupils       Dark Light Shape React APD   Right 3 2 Round Brisk None   Left 3 2 Round Brisk None         Visual Fields (Counting fingers)       Left Right    Full Full         Extraocular Movement       Right Left    Full, Ortho Full, Ortho         Neuro/Psych     Oriented x3: Yes   Mood/Affect: Normal         Dilation     Both eyes: 1.0% Mydriacyl, 2.5% Phenylephrine  @ 1:22 PM           Slit Lamp and Fundus Exam     Slit Lamp Exam       Right Left   Lids/Lashes mild Telangiectasia -- improved, marginal lesion nasal UL Dermatochalasis - upper lid   Conjunctiva/Sclera White and quiet White and quiet, inferior conj chalasis   Cornea Arcus, 2+ fine Punctate epithelial erosions, tear film debris, decreased TBUT arcus, tear film debris, 2+ inferior Punctate epithelial erosions   Anterior Chamber Deep and clear Deep and clear   Iris Round and dilated Round and well dilated   Lens PC IOL in good position, 1+Posterior capsular opacification IN PC IOL in good position with open PC   Anterior Vitreous Vitreous syneresis, Posterior vitreous detachment Vitreous syneresis, Posterior vitreous detachment, vitreous condensations         Fundus Exam       Right Left   Disc  Pink and Sharp, Compact, focal PPP Pink and Sharp, mild temporal PPA/PPP   C/D Ratio 0.3 0.3   Macula Blunted foveal reflex, Drusen, RPE mottling and clumping, early Atrophy, +PEDs -- stably improved, trace cystic changes -- stably improved, No frank heme, no edema Blunted foveal reflex, +drusen, pigment clumping, RPE mottling, clumping and atrophy, no heme, central PED / CNV with overlying cystic changes -- stably improved, +GA   Vessels attenuated, Tortuous attenuated, Tortuous   Periphery Attached, scattered reticular degeneration Attached, scattered reticular degeneration           IMAGING AND PROCEDURES  Imaging and Procedures for @TODAY @  OCT, Retina - OU - Both Eyes       Right Eye Quality was good. Central Foveal Thickness: 240. Progression has been stable. Findings include no IRF, no SRF, abnormal foveal contour, retinal drusen , intraretinal hyper-reflective material, pigment epithelial detachment, outer retinal atrophy (stable improvement in cystic changes nasal and temporal fovea, patchy ORA / GA -- no fluid).   Left Eye Quality was good. Central Foveal Thickness: 260. Progression has been stable. Findings include normal foveal contour, no IRF, no SRF, retinal drusen , intraretinal hyper-reflective material, pigment epithelial detachment, outer retinal atrophy (Stable improvement in IRF/cystic changes inferior fovea, stable improvement in [facility] / PED inferior to fovea ).   Notes *Images captured and stored on drive  Diagnosis / Impression:  OD: exudative ARMD -- stable improvement in cystic changes nasal and temporal fovea, patchy ORA / GA -- no fluid OS: exu-ARMD -- Stable improvement in IRF/cystic changes inferior fovea, stable improvement in [facility] / PED inferior to fovea   Clinical management:  See below  Abbreviations: NFP - Normal foveal profile. CME - cystoid macular edema. PED - pigment epithelial detachment. IRF - intraretinal fluid. SRF - subretinal fluid. EZ -  ellipsoid zone. ERM - epiretinal membrane. ORA - outer retinal atrophy. ORT - outer retinal tubulation. SRHM - subretinal hyper-reflective material      Intravitreal Injection, Pharmacologic Agent - OS - Left Eye       Time Out 04/17/2024. 1:44 PM. Confirmed correct patient, procedure, site, and patient consented.   Anesthesia Topical anesthesia was used. Anesthetic medications included Lidocaine  2%, Proparacaine 0.5%.   Procedure Preparation included 5% betadine  to ocular surface, eyelid speculum. A (32g) needle was used.   Injection: 2 mg aflibercept  2 MG/0.05ML   Route: Intravitreal, Site: Left Eye   NDC: [id number], Lot: [id number], Expiration date: 06/02/2025, Waste: 0 mL   Post-op Post injection exam found visual acuity of at least counting fingers. The patient tolerated the procedure well. There were no complications. The patient received written and verbal post procedure care education. Post injection medications were not given.            ASSESSMENT/PLAN:    ICD-10-CM   1. Exudative age-related macular degeneration of left eye with active choroidal neovascularization (HCC)  H35.3221 OCT, Retina - OU - Both Eyes    Intravitreal Injection, Pharmacologic Agent - OS - Left Eye    aflibercept  (EYLEA ) SOLN 2 mg    2. Exudative age-related macular degeneration of right eye with active choroidal neovascularization (HCC)  H35.3211     3. Essential hypertension  I10     4. Hypertensive retinopathy of both eyes  H35.033     5. Pseudophakia of both eyes  Z96.1     6. Dry eyes  H04.123     7. Left posterior capsular opacification  H26.492      1. Exudative age-related macular degeneration, left eye  - OCT 4.30.2020 showed interval conversion of OS from nonexudative ARMD to exu-ARMD with large dome-shaped PED with overlying IRF/SRF  - FA 05.28.20 -- no active CNV OS, just staining - s/p IVA OS #1 (04.30.20), #2 (05.28.20), #3 (06.26.20), #4 (08.03.20), #5 (09.11.20),  #6 (10.19.20), #7 (11.23.20) - reactivation of CNV noted on 05.05.22 -- s/p IVA OS  #8 (05.05.22), #9 (06.06.22), #10 (07.19.22), #11 (09.13.22), #12 (10.25.22), #13 (12.13.22), #14 (01.30.23), #15 (03.20.23), #16 (05.08.23), #17 (06.19.23), #18 (08.07.23), #19 (10.02.23), #20 (11.20.23), #21 (01.15.24), #22 (12.19.24), #23 (02.21.25) ================== - s/p IVE OS #1 (03.11.24), #2 (04.22.24), #3 (05.29.24), #4 (07.22.24), #5 (09.09.24), #6 (10.28.24), #7 (12.19.24), #8 (04.18.25), #9 (06.02.25), #10 (07.21.25) - **Interval increase in IRF overlying PED at 8 wks on 09.13.22 visit (IVA)**  - BCVA OS 20/30 from 20/40 at 7 wks - OCT shows Stable improvement in IRF/cystic changes inferior fovea, stable improvement in [facility] / PED inferior to fovea at 8 weeks - recommend IVE today OS #11 (09.15.25) with f/u ext to 10 weeks.  - pt wishes to proceed with injection  - RBA of procedure discussed, questions answered - see procedure note - IVE informed consent obtained and signed, 03.11.24 - benefits investigation for Eylea  initiated 4.30.2020 -- approved for 2025 but pt covering 20% coinsurance on medications  - f/u  in 10 weeks -- DFE/OCT, possible injection  2. Age related macular degeneration, exudative, OD - interval development of IRF first noted on 09.11.20 -- conversion from nonexudative to exudative ARMD - pt initially presented on 11.27.19 due to alert from [facility] home monitoring system for OD  - [facility] prescribed by Dr. Cleatus - FA (5.28.2020) showed staining / window defect corresponding temporal RPE changes OU -- no active CNV OU - S/P IVA OD #1 (09.11.20), #2 (10.19.20), #3 (11.23.20), #4 (01.07.21), #5 (2.19.21), #6 (04.02.21), #7 (05.28.21), #8 (07.23.21), #9 (09.24.21), #10 (11.29.21), #11 (02.11.22), #12 (05.05.22), #13 (7.19.22), #14 (09.13.22), #15 (10.02.23), #16 (11.20.23), #17 (01.15.24), #18 (03.11.24) - OCT shows stable improvement in cystic changes nasal and temporal fovea,  patchy ORA / GA -- no fluid  - BCVA OD stable at 20/30 (16+ mos since last injxn)  - recommend holding IVA OD again today -- will treat PRN - IVA informed consent obtained and signed, 05.08.23 (OU) - see procedure note  - f/u in 10 weeks DFE, OCT  3,4. Hypertensive retinopathy OU  - discussed importance of tight BP control  - monitor  5. Pseudophakia OU  - s/p CE/IOL OU  - beautiful surgeries, doing well  - monitor  6. Dry eyes OU  - recommend artificial tears and lubricating ointment as needed  - using Miebo and Lumify  per Dr. Vivian  7. PCO OU (OS > OD)  - s/p Yag Cap OS 07.31.24 - BCVA OS 20/30 and pleased with the result  Ophthalmic Meds Ordered this visit:  Meds ordered this encounter  Medications   aflibercept  (EYLEA ) SOLN 2 mg     Return in about 10 weeks (around 06/26/2024) for f/u, Ex. AMD, DFE, OCT, Possible, IVE, OS.  There are no Patient Instructions on file for this visit.  This document serves as a record of services personally performed by Redell JUDITHANN Hans, MD, PhD. It was created on their behalf by Avelina Pereyra, COA an ophthalmic technician. The creation of this record is the provider's dictation and/or activities during the visit.   Electronically signed by: Avelina GORMAN Pereyra, COT  04/18/24  12:52 AM   This document serves as a record of services personally performed by Redell JUDITHANN Hans, MD, PhD. It was created on their behalf by Wanda GEANNIE Keens, COT an ophthalmic technician. The creation of this record is the provider's dictation and/or activities during the visit.    Electronically signed by:  Wanda GEANNIE Keens, COT  04/18/24 12:52 AM  Redell JUDITHANN Hans, M.D., Ph.D. Diseases & Surgery of the Retina and Vitreous [facility]  I have reviewed the above documentation for accuracy and completeness, and I agree with the above. Redell JUDITHANN Hans, M.D., Ph.D. 04/18/24 12:54 AM     Abbreviations: M myopia (nearsighted); A astigmatism;  H hyperopia (farsighted); P presbyopia; Mrx spectacle prescription;  CTL contact lenses; OD right eye; OS left eye; OU both eyes  XT exotropia; ET esotropia; PEK punctate epithelial keratitis; PEE punctate epithelial erosions; DES dry eye syndrome; MGD meibomian gland dysfunction; ATs artificial tears; PFAT's preservative free artificial tears; NSC nuclear sclerotic cataract; PSC posterior subcapsular cataract; ERM epi-retinal membrane; PVD posterior vitreous detachment; RD retinal detachment; DM diabetes mellitus; DR diabetic retinopathy; NPDR non-proliferative diabetic retinopathy; PDR proliferative diabetic retinopathy; CSME clinically significant macular edema; DME diabetic macular edema; dbh dot blot hemorrhages; CWS cotton wool spot; POAG primary open angle glaucoma; C/D cup-to-disc ratio; HVF humphrey visual field; GVF goldmann visual field; OCT  optical coherence tomography; IOP intraocular pressure; BRVO Branch retinal vein occlusion; CRVO central retinal vein occlusion; CRAO central retinal artery occlusion; BRAO branch retinal artery occlusion; RT retinal tear; SB scleral buckle; PPV pars plana vitrectomy; VH Vitreous hemorrhage; PRP panretinal laser photocoagulation; IVK intravitreal kenalog ; VMT vitreomacular traction; MH Macular hole;  NVD neovascularization of the disc; NVE neovascularization elsewhere; AREDS age related eye disease study; ARMD age related macular degeneration; POAG primary open angle glaucoma; EBMD epithelial/anterior basement membrane dystrophy; ACIOL anterior chamber intraocular lens; IOL intraocular lens; PCIOL posterior chamber intraocular lens; Phaco/IOL phacoemulsification with intraocular lens placement; PRK photorefractive keratectomy; LASIK laser assisted in situ keratomileusis; HTN hypertension; DM diabetes mellitus; COPD chronic obstructive pulmonary disease

## 2024-04-06 ENCOUNTER — Inpatient Hospital Stay

## 2024-04-06 ENCOUNTER — Ambulatory Visit: Admitting: Oncology

## 2024-04-06 ENCOUNTER — Inpatient Hospital Stay: Attending: Oncology

## 2024-04-06 ENCOUNTER — Encounter: Payer: Self-pay | Admitting: Oncology

## 2024-04-06 ENCOUNTER — Inpatient Hospital Stay: Admitting: Internal Medicine

## 2024-04-06 VITALS — BP 137/65 | HR 74 | Temp 97.6°F | Resp 17 | Ht 63.0 in | Wt 122.1 lb

## 2024-04-06 DIAGNOSIS — Z881 Allergy status to other antibiotic agents status: Secondary | ICD-10-CM | POA: Diagnosis not present

## 2024-04-06 DIAGNOSIS — I10 Essential (primary) hypertension: Secondary | ICD-10-CM | POA: Insufficient documentation

## 2024-04-06 DIAGNOSIS — Z800 Family history of malignant neoplasm of digestive organs: Secondary | ICD-10-CM

## 2024-04-06 DIAGNOSIS — D519 Vitamin B12 deficiency anemia, unspecified: Secondary | ICD-10-CM | POA: Diagnosis not present

## 2024-04-06 DIAGNOSIS — Z79899 Other long term (current) drug therapy: Secondary | ICD-10-CM | POA: Diagnosis not present

## 2024-04-06 DIAGNOSIS — D509 Iron deficiency anemia, unspecified: Secondary | ICD-10-CM | POA: Diagnosis not present

## 2024-04-06 DIAGNOSIS — Z8261 Family history of arthritis: Secondary | ICD-10-CM

## 2024-04-06 DIAGNOSIS — E538 Deficiency of other specified B group vitamins: Secondary | ICD-10-CM | POA: Insufficient documentation

## 2024-04-06 DIAGNOSIS — M25559 Pain in unspecified hip: Secondary | ICD-10-CM | POA: Diagnosis not present

## 2024-04-06 DIAGNOSIS — Z8269 Family history of other diseases of the musculoskeletal system and connective tissue: Secondary | ICD-10-CM

## 2024-04-06 DIAGNOSIS — Z90722 Acquired absence of ovaries, bilateral: Secondary | ICD-10-CM | POA: Insufficient documentation

## 2024-04-06 DIAGNOSIS — Z806 Family history of leukemia: Secondary | ICD-10-CM | POA: Diagnosis not present

## 2024-04-06 DIAGNOSIS — Z803 Family history of malignant neoplasm of breast: Secondary | ICD-10-CM | POA: Diagnosis not present

## 2024-04-06 DIAGNOSIS — K909 Intestinal malabsorption, unspecified: Secondary | ICD-10-CM

## 2024-04-06 DIAGNOSIS — Z833 Family history of diabetes mellitus: Secondary | ICD-10-CM | POA: Diagnosis not present

## 2024-04-06 DIAGNOSIS — D649 Anemia, unspecified: Secondary | ICD-10-CM | POA: Insufficient documentation

## 2024-04-06 DIAGNOSIS — Z801 Family history of malignant neoplasm of trachea, bronchus and lung: Secondary | ICD-10-CM | POA: Insufficient documentation

## 2024-04-06 DIAGNOSIS — Z8049 Family history of malignant neoplasm of other genital organs: Secondary | ICD-10-CM | POA: Insufficient documentation

## 2024-04-06 DIAGNOSIS — Z882 Allergy status to sulfonamides status: Secondary | ICD-10-CM | POA: Insufficient documentation

## 2024-04-06 DIAGNOSIS — Z8249 Family history of ischemic heart disease and other diseases of the circulatory system: Secondary | ICD-10-CM | POA: Insufficient documentation

## 2024-04-06 DIAGNOSIS — I252 Old myocardial infarction: Secondary | ICD-10-CM | POA: Diagnosis not present

## 2024-04-06 DIAGNOSIS — E039 Hypothyroidism, unspecified: Secondary | ICD-10-CM

## 2024-04-06 DIAGNOSIS — Z90710 Acquired absence of both cervix and uterus: Secondary | ICD-10-CM | POA: Diagnosis not present

## 2024-04-06 DIAGNOSIS — I2510 Atherosclerotic heart disease of native coronary artery without angina pectoris: Secondary | ICD-10-CM | POA: Insufficient documentation

## 2024-04-06 DIAGNOSIS — Z8719 Personal history of other diseases of the digestive system: Secondary | ICD-10-CM | POA: Diagnosis not present

## 2024-04-06 DIAGNOSIS — K219 Gastro-esophageal reflux disease without esophagitis: Secondary | ICD-10-CM

## 2024-04-06 LAB — CMP (CANCER CENTER ONLY)
ALT: 11 U/L (ref 0–44)
AST: 18 U/L (ref 15–41)
Albumin: 3.9 g/dL (ref 3.5–5.0)
Alkaline Phosphatase: 93 U/L (ref 38–126)
Anion gap: 4 — ABNORMAL LOW (ref 5–15)
BUN: 25 mg/dL — ABNORMAL HIGH (ref 8–23)
CO2: 30 mmol/L (ref 22–32)
Calcium: 9.1 mg/dL (ref 8.9–10.3)
Chloride: 104 mmol/L (ref 98–111)
Creatinine: 0.68 mg/dL (ref 0.44–1.00)
GFR, Estimated: >60 mL/min (ref >60)
Glucose, Bld: 85 mg/dL (ref 70–99)
Potassium: 4 mmol/L (ref 3.5–5.1)
Sodium: 138 mmol/L (ref 135–145)
Total Bilirubin: 0.4 mg/dL (ref 0.0–1.2)
Total Protein: 6.6 g/dL (ref 6.5–8.1)

## 2024-04-06 LAB — CBC WITH DIFFERENTIAL (CANCER CENTER ONLY)
Abs Immature Granulocytes: 0.01 K/uL (ref 0.00–0.07)
Basophils Absolute: 0.1 K/uL (ref 0.0–0.1)
Basophils Relative: 1 %
Eosinophils Absolute: 0.1 K/uL (ref 0.0–0.5)
Eosinophils Relative: 2 %
HCT: 38.2 % (ref 36.0–46.0)
Hemoglobin: 12.4 g/dL (ref 12.0–15.0)
Immature Granulocytes: 0 %
Lymphocytes Relative: 18 %
Lymphs Abs: 1.1 K/uL (ref 0.7–4.0)
MCH: 29.5 pg (ref 26.0–34.0)
MCHC: 32.5 g/dL (ref 30.0–36.0)
MCV: 91 fL (ref 80.0–100.0)
Monocytes Absolute: 0.5 K/uL (ref 0.1–1.0)
Monocytes Relative: 9 %
Neutro Abs: 4.1 K/uL (ref 1.7–7.7)
Neutrophils Relative %: 70 %
Platelet Count: 267 K/uL (ref 150–400)
RBC: 4.2 MIL/uL (ref 3.87–5.11)
RDW: 13.1 % (ref 11.5–15.5)
WBC Count: 5.9 K/uL (ref 4.0–10.5)
nRBC: 0 % (ref 0.0–0.2)

## 2024-04-06 LAB — FERRITIN: Ferritin: 68 ng/mL (ref 11–307)

## 2024-04-06 LAB — IRON AND IRON BINDING CAPACITY (CC-WL,HP ONLY)
Iron: 65 ug/dL (ref 28–170)
Saturation Ratios: 19 % (ref 10.4–31.8)
TIBC: 339 ug/dL (ref 250–450)
UIBC: 274 ug/dL (ref 148–442)

## 2024-04-06 LAB — FOLATE: Folate: >20 ng/mL (ref >5.9)

## 2024-04-06 LAB — VITAMIN B12: Vitamin B-12: 448 pg/mL (ref 180–914)

## 2024-04-06 LAB — LACTATE DEHYDROGENASE: LDH: 162 U/L (ref 98–192)

## 2024-04-06 NOTE — Assessment & Plan Note (Signed)
 Anemia likely secondary to iron and vitamin B12 deficiency, possibly exacerbated by dietary restrictions and recent hip surgery.   Hemoglobin was 11.6 in July 2025, slightly below normal. No significant side effects from oral iron supplementation.  B12 absorption issues are a consideration, but currently managed with oral supplementation.   No evidence of bone marrow pathology as other blood counts are normal. Current interventions appear effective, but further evaluation is needed to confirm improvement.  Labs today showed improved hemoglobin of 12.4, normal MCV of 91.  White count and platelet count are within normal limits.  We did check iron studies, B12, folic acid, LDH, haptoglobin, methylmalonic acid.  Will follow-up on the results and inform patient over the phone next week.  - Continue oral iron and B12 supplementation.  - Consider B12 injections if oral absorption is inadequate.  - Advise taking iron with vitamin C to enhance absorption.  Plan for reevaluation in approximately 4 months with repeat labs.

## 2024-04-06 NOTE — Progress Notes (Signed)
 [facility]  HEMATOLOGY CLINIC CONSULTATION NOTE   PATIENT NAME: Kaitlyn Parks   MR#: [id number] DOB: 06-21-1935  DATE OF SERVICE: 04/06/2024  Patient Care Team: [name] [name], MD as PCP - General (Family Medicine) Lavona Agent, MD as PCP - Cardiology (Cardiology) Trixie File, MD as Consulting Physician (Internal Medicine) Mavis Purchase, MD as Consulting Physician (Neurosurgery) Watt Rush, MD as Attending Physician (Urology) Duke, Jon Garre, PA as Physician Assistant (Cardiology)  REASON FOR CONSULTATION/ CHIEF COMPLAINT:  Evaluation of anemia.  ASSESSMENT & PLAN:   Kaitlyn Parks is a 89 y.o. lady with a past medical history of CAD status post PCI in 2012, history of pancreatitis, hypertension, hypothyroidism, macular degeneration, osteoporosis, GERD, was referred to our service for evaluation of normocytic anemia.    Normocytic anemia Anemia likely secondary to iron and vitamin B12 deficiency, possibly exacerbated by dietary restrictions and recent hip surgery.   Hemoglobin was 11.6 in July 2025, slightly below normal. No significant side effects from oral iron supplementation.  B12 absorption issues are a consideration, but currently managed with oral supplementation.   No evidence of bone marrow pathology as other blood counts are normal. Current interventions appear effective, but further evaluation is needed to confirm improvement.  Labs today showed improved hemoglobin of 12.4, normal MCV of 91.  White count and platelet count are within normal limits.  We did check iron studies, B12, folic acid , LDH, haptoglobin, methylmalonic acid.  Will follow-up on the results and inform patient over the phone next week.  - Continue oral iron and B12 supplementation.  - Consider B12 injections if oral absorption is inadequate.  - Advise taking iron with vitamin C to enhance absorption.  Plan for reevaluation in approximately 4 months with  repeat labs.   I reviewed lab results and outside records for this visit and discussed relevant results with the patient. Diagnosis, plan of care and treatment options were also discussed in detail with the patient. Opportunity provided to ask questions and answers provided to her apparent satisfaction. Provided instructions to call our clinic with any problems, questions or concerns prior to return visit. I recommended to continue follow-up with PCP and sub-specialists. She verbalized understanding and agreed with the plan. No barriers to learning was detected.  Maxie Debose, MD [facility] - A DEPT OF [facility] [address] [place] [address] Dept: [phone] Dept Fax: [phone]  04/06/2024 12:40 PM  HISTORY OF PRESENT ILLNESS:  Discussed the use of AI scribe software for clinical note transcription with the patient, who gave verbal consent to proceed.  History of Present Illness Kaitlyn Parks is an 89 year old female who presents with low blood counts. She was referred by her gastroenterology doctors for anemia issues.  Labs at her gastroenterologist office on 02/23/2024 showed anemia with hemoglobin of 11.6, MCV 90.8.  White count 5800 with normal differential.  Platelet count 410,000.  Iron studies showed decreased iron of 30, iron saturation decreased at 9%.  Ferritin was 92.  She was referred to us  for normocytic anemia.   EGD on 03/03/2024 showed diffusely atrophic mucosa consistent with atrophic gastritis associated with B12 deficiency.  Review of records indicate that she has been having fluctuating anemia since June 2025.  Her last colonoscopy was in September 2007 which was unremarkable.  She has experienced lightheadedness since June 2025, coinciding with her hip replacement surgery on January 27, 2024.  She recalls being told that her anemia could be related to blood loss during her hip replacement  surgery. She continues to experience warmth in her thigh, which she attributes to nerve endings, and she ices the area regularly. Her physical activity has decreased since the surgery, but she is gradually resuming exercises, including water  exercises and stationary biking.  In April 2025, she visited her internal medicine doctor due to stomach issues, which she believes were caused by excessive use of Advil and Aleve for hip pain. She experienced burning in her stomach and was prescribed Protonix , which she has been taking since then.  She has a history of pancreatitis in December 2023 and avoids red meat, consuming only chicken and fish. Her B12 levels were found to be low in July 2025; she does not eat red meat and consumes only chicken and fish. She is currently taking oral B12 supplements and over-the-counter iron supplements once daily without any major side effects. No constipation, heartburn, or nausea from the iron supplements.  Her hemoglobin was recorded at 11.6, slightly below the normal range of 12. She is trying to improve her diet by eating more green vegetables, such as broccoli, and her daughter assists by preparing green dishes. Her other daughter consumes meat, and occasionally she eats leftover beef from her daughter's meals.   MEDICAL HISTORY:  Past Medical History:  Diagnosis Date   Arthritis    DDD, scoliosis, sees Dr. Mavis for this, uses norco very rarely for pain   Arthritis    lumbar stenosis, gets steroid injections for [facility]   Bradycardia 11/11/2017   CAD (coronary artery disease)    LAD stenting of a 90% lesion 2012   Cystocele    Educated about COVID-19 virus infection 12/06/2019   Elevated cholesterol    GERD (gastroesophageal reflux disease)    dx in work up 2016 for atypical CP at OSH   pt. denies   Heart murmur    Hypertensive retinopathy    OU   Hypothyroidism    Interstitial cystitis    sees Dr. Watt   Macular degeneration    OU    Neuromuscular disorder [facility])    neuropathy   NSTEMI (non-ST elevated myocardial infarction) (HCC)    Osteoporosis    Rectocele    S/P hip replacement, right 06/12/2017   Scoliosis    Thyroid  disease    Hypothyroid   Urinary incontinence    USI   Uterine prolapse     SURGICAL HISTORY: Past Surgical History:  Procedure Laterality Date   BLADDER SUSPENSION  2011   CATARACT EXTRACTION Bilateral 2015   Dr. Cleatus   CORONARY ANGIOPLASTY WITH STENT PLACEMENT  04/21/2010   LAD 80%, RCA 30%, nl EF, s/p DES LAD   EYE SURGERY     LEFT HEART CATH AND CORONARY ANGIOGRAPHY N/A 06/14/2017   Procedure: LEFT HEART CATH AND CORONARY ANGIOGRAPHY;  Surgeon: Court Dorn PARAS, MD;  Location: [facility];  Service: Cardiovascular;  Laterality: N/A;   OOPHORECTOMY  2011   BSO   TOTAL HIP ARTHROPLASTY Right 06/10/2017   Procedure: RIGHT TOTAL HIP ARTHROPLASTY ANTERIOR APPROACH;  Surgeon: Fidel Rogue, MD;  Location: [facility];  Service: Orthopedics;  Laterality: Right;  Needs RNFA   TOTAL HIP ARTHROPLASTY Left 01/27/2024   Procedure: ARTHROPLASTY, HIP, TOTAL, ANTERIOR APPROACH;  Surgeon: Fidel Rogue, MD;  Location: [facility];  Service: Orthopedics;  Laterality: Left;   VAGINAL HYSTERECTOMY  2011   [facility] BSO; benign  SOCIAL HISTORY: She reports that she has never smoked. She has never used smokeless tobacco. She reports that she does not drink alcohol  and does not use drugs. Social History   Socioeconomic History   Marital status: Widowed    Spouse name: Not on file   Number of children: 2   Years of education: Not on file   Highest education level: Associate degree: occupational, Scientist, product/process development, or vocational program  Occupational History   Occupation: retired  Tobacco Use   Smoking status: Never   Smokeless tobacco: Never  Vaping Use   Vaping status: Never Used  Substance and Sexual Activity   Alcohol  use: No    Comment: Rare   Drug use: No   Sexual activity: Never    Birth  control/protection: Surgical  Other Topics Concern   Not on file  Social History Narrative   Work or School:  none      Home Situation: lives with husband      Spiritual Beliefs: Methodist      Lifestyle: regular exercise (yoga, water  aerobics); diet is healthy      Social Drivers of Corporate investment banker Strain: Low Risk  (10/27/2023)   Overall Financial Resource Strain (CARDIA)    Difficulty of Paying Living Expenses: Not hard at all  Food Insecurity: No Food Insecurity (04/06/2024)   Hunger Vital Sign    Worried About Running Out of Food in the Last Year: Never true    Ran Out of Food in the Last Year: Never true  Transportation Needs: No Transportation Needs (04/06/2024)   PRAPARE - Administrator, Civil Service (Medical): No    Lack of Transportation (Non-Medical): No  Physical Activity: Sufficiently Active (08/13/2023)   Exercise Vital Sign    Days of Exercise per Week: 3 days    Minutes of Exercise per Session: 60 min  Stress: No Stress Concern Present (08/13/2023)   [employer] of Occupational Health - Occupational Stress Questionnaire    Feeling of Stress : Not at all  Social Connections: Moderately Integrated (02/08/2024)   Social Connection and Isolation Panel    Frequency of Communication with Friends and Family: More than three times a week    Frequency of Social Gatherings with Friends and Family: More than three times a week    Attends Religious Services: More than 4 times per year    Active Member of [employer] or Organizations: Yes    Attends Banker Meetings: More than 4 times per year    Marital Status: Widowed  Intimate Partner Violence: Not At Risk (04/06/2024)   Humiliation, Afraid, Rape, and Kick questionnaire    Fear of Current or Ex-Partner: No    Emotionally Abused: No    Physically Abused: No    Sexually Abused: No    FAMILY HISTORY: Family History  Problem Relation Age of Onset   Hypertension Mother    Heart disease  Mother    Heart disease Father    Heart disease Sister    Diabetes Sister    Uterine cancer Sister        mets to lungs   Lung cancer Sister    Scoliosis Sister    Heart disease Brother    Colon cancer Paternal Aunt 6   Breast cancer Paternal Aunt        Age 17's   Breast cancer Paternal Aunt    Leukemia Paternal Aunt    Lung cancer Paternal Grandfather  smoker   Arthritis Daughter    Breast cancer Cousin        Maternal 1st cousins-Age 1's   Esophageal cancer Neg Hx    Pancreatic cancer Neg Hx    Stomach cancer Neg Hx     ALLERGIES:  She is allergic to acyclovir and related, ciprofloxacin , pravachol  [pravastatin  sodium], sulfa  antibiotics, and zocor  [simvastatin ].  MEDICATIONS:  Current Outpatient Medications  Medication Sig Dispense Refill   Aflibercept  (EYLEA  IZ) 1 Dose by Intravitreal route See admin instructions. Every 7 weeks     amLODipine  (NORVASC ) 2.5 MG tablet Take 1 tablet (2.5 mg total) by mouth daily. 90 tablet 3   Biotin w/ Vitamins C & E (HAIR/SKIN/NAILS PO) Take 1 tablet by mouth daily with lunch.     Brimonidine  Tartrate (LUMIFY ) 0.025 % SOLN Place 1 drop into both eyes every 4 (four) hours as needed (irritated eyes).     Cranberry 400 MG CAPS Take 400 mg by mouth in the morning.     hydrALAZINE  (APRESOLINE ) 10 MG tablet TAKE (1) TABLET TWICE A DAY. MAY TAKE EXTRA DOSE FOR SBP >160 UP TO A TOTAL OF 4 TIMES A DAY AS NEEDED 360 tablet 3   isosorbide  mononitrate (IMDUR ) 60 MG 24 hr tablet TAKE 1 & 1/2 TABLETS by mouth once A DAY. 135 tablet 1   Lidocaine -Menthol  3.5-7 % PTCH Place 1 patch onto the skin 2 (two) times daily as needed (knee/hip pain.).     Multiple Vitamins-Calcium  (ONE-A-DAY WOMENS PO) Take 1 tablet by mouth daily with lunch.     nitroGLYCERIN  (NITROSTAT ) 0.4 MG SL tablet DISSOLVE 1 TABLET UNDER TONGUE IF NEEDED FOR CHEST PAIN. MAY REPEAT IN 5 MINUTES FOR 3 DOSES. 25 tablet 11   ondansetron  (ZOFRAN ) 4 MG tablet Take 1 tablet (4 mg total) by  mouth every 6 (six) hours as needed for nausea. 20 tablet 0   pantoprazole  (PROTONIX ) 40 MG tablet Take 1 tablet (40 mg total) by mouth daily. 30 tablet 1   rosuvastatin  (CRESTOR ) 10 MG tablet TABLET ONE-HALF TABLET BY MOUTH DAILY 45 tablet 3   SYNTHROID  50 MCG tablet TAKE 1 TABLET EACH DAY. NEED APPOINTMENT FOR ADDITIONAL REFILLS 90 tablet 0   NON FORMULARY Get steroid inject for SI joint every 12 wks with Dr. Darlis Leash surgery spine (Patient not taking: Reported on 04/06/2024)     No current facility-administered medications for this visit.    REVIEW OF SYSTEMS:    Review of Systems - Oncology  All other pertinent systems were reviewed and were negative except as mentioned above.  PHYSICAL EXAMINATION:   Onc Performance Status - 04/06/24 1126       ECOG Perf Status   ECOG Perf Status Ambulatory and capable of all selfcare but unable to carry out any work activities.  Up and about more than 50% of waking hours      KPS SCALE   KPS % SCORE Cares for self, unable to carry on normal activity or to do active work          Vitals:   04/06/24 1112  BP: 137/65  Pulse: 74  Resp: 17  Temp: 97.6 F (36.4 C)  SpO2: 99%   Filed Weights   04/06/24 1112  Weight: 122 lb 1.6 oz (55.4 kg)    Physical Exam Constitutional:      General: She is not in acute distress.    Appearance: Normal appearance.  HENT:     Head: Normocephalic and atraumatic.  Cardiovascular:     Rate and Rhythm: Normal rate.  Pulmonary:     Effort: Pulmonary effort is normal. No respiratory distress.  Abdominal:     General: There is no distension.  Neurological:     General: No focal deficit present.     Mental Status: She is alert and oriented to person, place, and time.  Psychiatric:        Mood and Affect: Mood normal.        Behavior: Behavior normal.     LABORATORY DATA:   I have reviewed the data as listed.  Results for orders placed or performed in visit on 04/06/24  CBC with  Differential (Cancer Center Only)  Result Value Ref Range   WBC Count 5.9 4.0 - 10.5 K/uL   RBC 4.20 3.87 - 5.11 MIL/uL   Hemoglobin 12.4 12.0 - 15.0 g/dL   HCT 61.7 63.9 - 53.9 %   MCV 91.0 80.0 - 100.0 fL   MCH 29.5 26.0 - 34.0 pg   MCHC 32.5 30.0 - 36.0 g/dL   RDW 86.8 88.4 - 84.4 %   Platelet Count 267 150 - 400 K/uL   nRBC 0.0 0.0 - 0.2 %   Neutrophils Relative % 70 %   Neutro Abs 4.1 1.7 - 7.7 K/uL   Lymphocytes Relative 18 %   Lymphs Abs 1.1 0.7 - 4.0 K/uL   Monocytes Relative 9 %   Monocytes Absolute 0.5 0.1 - 1.0 K/uL   Eosinophils Relative 2 %   Eosinophils Absolute 0.1 0.0 - 0.5 K/uL   Basophils Relative 1 %   Basophils Absolute 0.1 0.0 - 0.1 K/uL   Immature Granulocytes 0 %   Abs Immature Granulocytes 0.01 0.00 - 0.07 K/uL    RADIOGRAPHIC STUDIES:  No recent pertinent imaging studies available to review.  Orders Placed This Encounter  Procedures   CBC with Differential (Cancer Center Only)    Standing Status:   Future    Number of Occurrences:   1    Expiration Date:   04/06/2025   CMP (Cancer Center only)    Standing Status:   Future    Number of Occurrences:   1    Expiration Date:   04/06/2025   Iron and Iron Binding Capacity ([facility] only)    Standing Status:   Future    Number of Occurrences:   1    Expiration Date:   04/06/2025   Ferritin    Standing Status:   Future    Number of Occurrences:   1    Expiration Date:   04/06/2025   Vitamin B12    Standing Status:   Future    Number of Occurrences:   1    Expiration Date:   04/06/2025   Folate    Standing Status:   Future    Number of Occurrences:   1    Expiration Date:   04/06/2025   Lactate dehydrogenase    Standing Status:   Future    Number of Occurrences:   1    Expiration Date:   04/06/2025   Methylmalonic acid, serum    Standing Status:   Future    Number of Occurrences:   1    Expiration Date:   04/06/2025   Haptoglobin    Standing Status:   Future    Number of Occurrences:   1     Expiration Date:   04/06/2025    Future Appointments  Date Time Provider  Department Center  04/13/2024 12:00 PM Liane Tribbey, Chinita, MD [facility] None  04/17/2024  1:15 PM Valdemar Rogue, MD [facility] None  05/29/2024  1:20 PM Thapa, [place], MD [facility] None  08/16/2024 11:00 AM [facility] LAB [facility] None  08/16/2024 11:30 AM Graylee Arutyunyan, Chinita, MD [facility] None  08/18/2024 10:00 AM [facility]AL WELLNESS VISIT [facility] [address]  08/21/2024  1:00 PM [name] HERO, MD [facility] [address]     I spent a total of 40 minutes during this encounter with the patient including review of chart and various tests results, discussions about plan of care and coordination of care plan.  This document was completed utilizing speech recognition software. Grammatical errors, random word insertions, pronoun errors, and incomplete sentences are an occasional consequence of this system due to software limitations, ambient noise, and hardware issues. Any formal questions or concerns about the content, text or information contained within the body of this dictation should be directly addressed to the provider for clarification.

## 2024-04-07 LAB — HAPTOGLOBIN: Haptoglobin: 164 mg/dL (ref 41–333)

## 2024-04-10 LAB — METHYLMALONIC ACID, SERUM: Methylmalonic Acid, Quantitative: 338 nmol/L (ref 0–378)

## 2024-04-13 ENCOUNTER — Encounter: Payer: Self-pay | Admitting: Oncology

## 2024-04-13 ENCOUNTER — Inpatient Hospital Stay: Admitting: Oncology

## 2024-04-13 DIAGNOSIS — D649 Anemia, unspecified: Secondary | ICD-10-CM | POA: Diagnosis not present

## 2024-04-13 NOTE — Assessment & Plan Note (Signed)
 Anemia likely secondary to iron and vitamin B12 deficiency, possibly exacerbated by dietary restrictions and recent hip surgery.   Hemoglobin was 11.6 in July 2025, slightly below normal. No significant side effects from oral iron supplementation.  B12 absorption issues are a consideration, but currently managed with oral supplementation.   No evidence of bone marrow pathology as other blood counts are normal.  On her consultation with us  on 04/06/2024, labs actually showed improved hemoglobin of 12.4, MCV normal at 91.  White count, platelet count were within normal limits.  Iron studies, B12, folic acid, LDH, haptoglobin, methylmalonic acid were all within normal limits.  She was advised to continue oral iron and B12 supplements.  She was advised to take vitamin C along with iron to enhance absorption.  Plan for reevaluation in approximately 4 months with repeat labs.

## 2024-04-13 NOTE — Progress Notes (Signed)
 [facility]  HEMATOLOGY-ONCOLOGY ELECTRONIC VISIT PROGRESS NOTE  PATIENT NAME: Kaitlyn Parks   MR#: [id number] DOB: Dec 03, 1934  DATE OF SERVICE: 04/13/2024  Patient Care Team: [name] [name], MD as PCP - General (Family Medicine) Lavona Agent, MD as PCP - Cardiology (Cardiology) Trixie File, MD as Consulting Physician (Internal Medicine) Mavis Purchase, MD as Consulting Physician (Neurosurgery) Watt Rush, MD as Attending Physician (Urology) Duke, Jon Garre, PA as Physician Assistant (Cardiology) Craig Alan JONELLE DEVONNA (Gastroenterology)  I connected with the patient via telephone conference and verified that I am speaking with the correct person using two identifiers. The patient's location is at home and I am providing care from the [facility].  I discussed the limitations, risks, security and privacy concerns of performing an evaluation and management service by e-visits and the availability of in person appointments. I also discussed with the patient that there may be a patient responsible charge related to this service. The patient expressed understanding and agreed to proceed.   ASSESSMENT & PLAN:   Kaitlyn Parks is a 89 y.o. lady with a past medical history of CAD status post PCI in 2012, history of pancreatitis, hypertension, hypothyroidism, macular degeneration, osteoporosis, GERD, was referred to our service in September 2025 for evaluation of normocytic anemia.    Normocytic anemia Anemia likely secondary to iron and vitamin B12 deficiency, possibly exacerbated by dietary restrictions and recent hip surgery.   Hemoglobin was 11.6 in July 2025, slightly below normal. No significant side effects from oral iron supplementation.  B12 absorption issues are a consideration, but currently managed with oral supplementation.   No evidence of bone marrow pathology as other blood counts are normal.  On her consultation with us  on 04/06/2024,  labs actually showed improved hemoglobin of 12.4, MCV normal at 91.  White count, platelet count were within normal limits.  Iron studies, B12, folic acid, LDH, haptoglobin, methylmalonic acid were all within normal limits.  She was advised to continue oral iron and B12 supplements.  She was advised to take vitamin C along with iron to enhance absorption.  Plan for reevaluation in approximately 4 months with repeat labs.   Assessment and Plan Assessment & Plan Mild anemia Previous mild anemia with current normal hemoglobin level at 12.4 g/dL. White blood cell count, platelet count, kidney function, liver function, electrolytes, iron levels, and vitamin B12 levels are all normal. No evidence of hemolysis. Anemia appears resolved with current supplementation regimen. - Continue B12 and iron supplements to maintain normal blood counts.      I discussed the assessment and treatment plan with the patient. The patient was provided an opportunity to ask questions and all were answered. The patient agreed with the plan and demonstrated an understanding of the instructions. The patient was advised to call back or seek an in-person evaluation if the symptoms worsen or if the condition fails to improve as anticipated.    I spent 11 minutes over the phone with the patient reviewing test results, discuss management and coordination/planning of care.  Chinita Patten, MD 04/13/2024 6:16 PM [facility] - A DEPT OF [facility] [address] [place] [address] Dept: [phone] Dept Fax: [phone]   INTERVAL HISTORY:  Please see above for problem oriented charting.  The purpose of today's discussion is to explain recent lab results and to formulate plan of care.  Discussed the use of AI scribe software for  clinical note transcription with the patient, who gave verbal consent to proceed.  History of Present Illness Kaitlyn Parks is an 89 year old female who presents for follow-up of mild anemia. She was initially referred for mild anemia.  Recent blood work shows her hemoglobin is 12.4 g/dL. Her white blood cell count and platelet count are within normal limits, and there is no evidence of red blood cell breakdown.  Her kidney function, liver function, electrolytes, iron levels, and vitamin B12 levels are all within normal limits. She is currently taking B12 and iron supplements.      SUMMARY OF HEMATOLOGY HISTORY:   She was referred by her gastroenterology doctors for anemia issues.   Labs at her gastroenterologist office on 02/23/2024 showed anemia with hemoglobin of 11.6, MCV 90.8.  White count 5800 with normal differential.  Platelet count 410,000.  Iron studies showed decreased iron of 30, iron saturation decreased at 9%.  Ferritin was 92.  She was referred to us  for normocytic anemia.   EGD on 03/03/2024 showed diffusely atrophic mucosa consistent with atrophic gastritis associated with B12 deficiency.   Review of records indicate that she has been having fluctuating anemia since June 2025.   Her last colonoscopy was in September 2007 which was unremarkable.   She has experienced lightheadedness since June 2025, coinciding with her hip replacement surgery on January 27, 2024. She recalls being told that her anemia could be related to blood loss during her hip replacement surgery. She continues to experience warmth in her thigh, which she attributes to nerve endings, and she ices the area regularly. Her physical activity has decreased since the surgery, but she is gradually resuming exercises, including water  exercises and stationary biking.   In April 2025, she visited her internal medicine doctor due to stomach issues, which she believes were caused by excessive use of Advil and Aleve for hip pain. She experienced burning in her stomach and was prescribed Protonix , which she has been taking since then.    She has a history of pancreatitis in December 2023 and avoids red meat, consuming only chicken and fish. Her B12 levels were found to be low in July 2025; she does not eat red meat and consumes only chicken and fish. She is currently taking oral B12 supplements and over-the-counter iron supplements once daily without any major side effects. No constipation, heartburn, or nausea from the iron supplements.   Her hemoglobin was recorded at 11.6, slightly below the normal range of 12. She is trying to improve her diet by eating more green vegetables, such as broccoli, and her daughter assists by preparing green dishes. Her other daughter consumes meat, and occasionally she eats leftover beef from her daughter's meals.  On her consultation with us  on 04/06/2024, labs actually showed improved hemoglobin of 12.4, MCV normal at 91.  White count, platelet count were within normal limits.  Iron studies, B12, folic acid, LDH, haptoglobin, methylmalonic acid were all within normal limits.  She was advised to continue oral iron and B12 supplements.  She was advised to take vitamin C along with iron to enhance absorption.  REVIEW OF SYSTEMS:    Review of Systems - Oncology  All other pertinent systems were reviewed with the patient and are negative.  I have reviewed the past medical history, past surgical history, social history and family history with the patient and they are unchanged from previous note.  ALLERGIES:  She is allergic to acyclovir and related, ciprofloxacin , pravachol  [pravastatin  sodium],  sulfa  antibiotics, and zocor  [simvastatin ].  MEDICATIONS:  Current Outpatient Medications  Medication Sig Dispense Refill   Aflibercept  (EYLEA  IZ) 1 Dose by Intravitreal route See admin instructions. Every 7 weeks     amLODipine  (NORVASC ) 2.5 MG tablet Take 1 tablet (2.5 mg total) by mouth daily. 90 tablet 3   Biotin w/ Vitamins C & E (HAIR/SKIN/NAILS PO) Take 1 tablet by mouth daily with lunch.      Brimonidine  Tartrate (LUMIFY ) 0.025 % SOLN Place 1 drop into both eyes every 4 (four) hours as needed (irritated eyes).     Cranberry 400 MG CAPS Take 400 mg by mouth in the morning.     hydrALAZINE  (APRESOLINE ) 10 MG tablet TAKE (1) TABLET TWICE A DAY. MAY TAKE EXTRA DOSE FOR SBP >160 UP TO A TOTAL OF 4 TIMES A DAY AS NEEDED 360 tablet 3   isosorbide  mononitrate (IMDUR ) 60 MG 24 hr tablet TAKE 1 & 1/2 TABLETS by mouth once A DAY. 135 tablet 1   Lidocaine -Menthol  3.5-7 % PTCH Place 1 patch onto the skin 2 (two) times daily as needed (knee/hip pain.).     Multiple Vitamins-Calcium  (ONE-A-DAY WOMENS PO) Take 1 tablet by mouth daily with lunch.     nitroGLYCERIN  (NITROSTAT ) 0.4 MG SL tablet DISSOLVE 1 TABLET UNDER TONGUE IF NEEDED FOR CHEST PAIN. MAY REPEAT IN 5 MINUTES FOR 3 DOSES. 25 tablet 11   NON FORMULARY Get steroid inject for SI joint every 12 wks with Dr. Darlis Leash surgery spine (Patient not taking: Reported on 04/06/2024)     ondansetron  (ZOFRAN ) 4 MG tablet Take 1 tablet (4 mg total) by mouth every 6 (six) hours as needed for nausea. 20 tablet 0   pantoprazole  (PROTONIX ) 40 MG tablet Take 1 tablet (40 mg total) by mouth daily. 30 tablet 1   rosuvastatin  (CRESTOR ) 10 MG tablet TABLET ONE-HALF TABLET BY MOUTH DAILY 45 tablet 3   SYNTHROID  50 MCG tablet TAKE 1 TABLET EACH DAY. NEED APPOINTMENT FOR ADDITIONAL REFILLS 90 tablet 0   No current facility-administered medications for this visit.    PHYSICAL EXAMINATION:   Onc Performance Status - 04/13/24 1800       ECOG Perf Status   ECOG Perf Status Restricted in physically strenuous activity but ambulatory and able to carry out work of a light or sedentary nature, e.g., light house work, office work      KPS SCALE   KPS % SCORE Normal activity with effort, some s/s of disease          LABORATORY DATA:   I have reviewed the data as listed.  Recent Results (from the past 2160 hours)  Type and screen [facility]     Status: None   Collection Time: 01/17/24  3:00 PM  Result Value Ref Range   ABO/RH(D) AB POS    Antibody Screen NEG    Sample Expiration 01/30/2024,2359    Extend sample reason      NO TRANSFUSIONS OR PREGNANCY IN THE PAST 3 MONTHS Performed at [facility], [address] [place], [place] [address]   Surgical pcr screen     Status: None   Collection Time: 01/17/24  3:09 PM   Specimen: Nasal Mucosa; Nasal Swab  Result Value Ref Range   MRSA, PCR NEGATIVE NEGATIVE   Staphylococcus aureus NEGATIVE NEGATIVE    Comment: (NOTE) The Xpert SA Assay (FDA approved for NASAL specimens in patients 54 years of age and older), is one  component of a comprehensive surveillance program. It is not intended to diagnose infection nor to guide or monitor treatment. Performed at [facility], [address] [place], [place] [address]   Basic metabolic panel     Status: Abnormal   Collection Time: 01/27/24  8:30 AM  Result Value Ref Range   Sodium 137 135 - 145 mmol/L   Potassium 4.2 3.5 - 5.1 mmol/L   Chloride 104 98 - 111 mmol/L   CO2 24 22 - 32 mmol/L   Glucose, Bld 81 70 - 99 mg/dL    Comment: Glucose reference range applies only to samples taken after fasting for at least 8 hours.   BUN 21 8 - 23 mg/dL   Creatinine, Ser 9.04 0.44 - 1.00 mg/dL   Calcium  9.0 8.9 - 10.3 mg/dL   GFR, Estimated 58 (L) >60 mL/min    Comment: (NOTE) Calculated using the CKD-EPI Creatinine Equation (2021)    Anion gap 9 5 - 15    Comment: Performed at [facility], [address], [place], [place] [address]  CBC     Status: None   Collection Time: 01/27/24  8:30 AM  Result Value Ref Range   WBC 4.5 4.0 - 10.5 K/uL   RBC 4.05 3.87 - 5.11 MIL/uL   Hemoglobin 12.6 12.0 - 15.0 g/dL   HCT 61.0 63.9 - 53.9 %   MCV 96.0 80.0 - 100.0 fL   MCH 31.1 26.0 - 34.0 pg   MCHC 32.4 30.0 - 36.0 g/dL   RDW 87.6 88.4 - 84.4 %   Platelets 219 150 - 400  K/uL   nRBC 0.0 0.0 - 0.2 %    Comment: Performed at [facility], [address] [place], [place] [address]  CBC     Status: Abnormal   Collection Time: 01/28/24  3:15 AM  Result Value Ref Range   WBC 13.0 (H) 4.0 - 10.5 K/uL   RBC 3.58 (L) 3.87 - 5.11 MIL/uL   Hemoglobin 10.9 (L) 12.0 - 15.0 g/dL   HCT 65.6 (L) 63.9 - 53.9 %   MCV 95.8 80.0 - 100.0 fL   MCH 30.4 26.0 - 34.0 pg   MCHC 31.8 30.0 - 36.0 g/dL   RDW 87.8 88.4 - 84.4 %   Platelets 209 150 - 400 K/uL   nRBC 0.0 0.0 - 0.2 %    Comment: Performed at [facility], [address] [place], [place] [address]  Basic metabolic panel     Status: Abnormal   Collection Time: 01/28/24  3:15 AM  Result Value Ref Range   Sodium 136 135 - 145 mmol/L   Potassium 4.0 3.5 - 5.1 mmol/L   Chloride 105 98 - 111 mmol/L   CO2 22 22 - 32 mmol/L   Glucose, Bld 185 (H) 70 - 99 mg/dL    Comment: Glucose reference range applies only to samples taken after fasting for at least 8 hours.   BUN 18 8 - 23 mg/dL   Creatinine, Ser 9.18 0.44 - 1.00 mg/dL   Calcium  8.8 (L) 8.9 - 10.3 mg/dL   GFR, Estimated >39 >39 mL/min    Comment: (NOTE) Calculated using the CKD-EPI Creatinine Equation (2021)    Anion gap 9 5 - 15    Comment: Performed at [facility], [address] [place], [place] [address]  CBC     Status: Abnormal   Collection Time: 01/29/24  3:49 AM  Result Value Ref Range   WBC 8.8 4.0 - 10.5 K/uL   RBC 3.18 (L) 3.87 - 5.11 MIL/uL   Hemoglobin 9.9 (L) 12.0 - 15.0 g/dL   HCT 68.4 (L) 63.9 - 53.9 %   MCV 99.1 80.0 - 100.0 fL   MCH 31.1 26.0 - 34.0 pg   MCHC 31.4 30.0 - 36.0 g/dL   RDW 87.5 88.4 - 84.4 %   Platelets 182 150 - 400 K/uL   nRBC 0.0 0.0 - 0.2 %    Comment: Performed at [facility], [address], [place], [place] [address]  Basic metabolic panel     Status: Abnormal   Collection Time: 01/29/24  3:49 AM  Result Value Ref Range   Sodium 136  135 - 145 mmol/L   Potassium 4.0 3.5 - 5.1 mmol/L   Chloride 104 98 - 111 mmol/L   CO2 24 22 - 32 mmol/L   Glucose, Bld 106 (H) 70 - 99 mg/dL    Comment: Glucose reference range applies only to samples taken after fasting for at least 8 hours.   BUN 20 8 - 23 mg/dL   Creatinine, Ser 9.50 0.44 - 1.00 mg/dL   Calcium  8.4 (L) 8.9 - 10.3 mg/dL   GFR, Estimated >39 >39 mL/min    Comment: (NOTE) Calculated using the CKD-EPI Creatinine Equation (2021)    Anion gap 8 5 - 15    Comment: Performed at [facility], [address] [place], [place] [address]  CBC with Differential     Status: Abnormal   Collection Time: 02/07/24  8:58 PM  Result Value Ref Range   WBC 8.4 4.0 - 10.5 K/uL   RBC 3.30 (L) 3.87 - 5.11 MIL/uL   Hemoglobin 10.1 (L) 12.0 - 15.0 g/dL   HCT 69.7 (L) 63.9 - 53.9 %   MCV 91.5 80.0 - 100.0 fL   MCH 30.6 26.0 - 34.0 pg   MCHC 33.4 30.0 - 36.0 g/dL   RDW 87.3 88.4 - 84.4 %   Platelets 421 (H) 150 - 400 K/uL   nRBC 0.0 0.0 - 0.2 %   Neutrophils Relative % 82 %   Neutro Abs 6.8 1.7 - 7.7 K/uL   Lymphocytes Relative 9 %   Lymphs Abs 0.8 0.7 - 4.0 K/uL   Monocytes Relative 7 %   Monocytes Absolute 0.6 0.1 - 1.0 K/uL   Eosinophils Relative 1 %   Eosinophils Absolute 0.1 0.0 - 0.5 K/uL   Basophils Relative 0 %   Basophils Absolute 0.0 0.0 - 0.1 K/uL   Immature Granulocytes 1 %   Abs Immature Granulocytes 0.06 0.00 - 0.07 K/uL    Comment: Performed at [facility], [address], [place], [place] [address]  Comprehensive metabolic panel     Status: Abnormal   Collection Time: 02/07/24  8:58 PM  Result Value Ref Range   Sodium 125 (L) 135 - 145 mmol/L   Potassium 3.6 3.5 - 5.1 mmol/L   Chloride 90 (L) 98 - 111 mmol/L   CO2 22 22 - 32 mmol/L   Glucose, Bld 107 (H) 70 - 99 mg/dL    Comment: Glucose reference range applies only to samples taken after fasting for at least 8 hours.   BUN 11 8 - 23 mg/dL   Creatinine, Ser 9.45 0.44 -  1.00 mg/dL   Calcium  9.0 8.9 - 10.3 mg/dL   Total Protein 6.0 (L) 6.5 - 8.1 g/dL   Albumin  3.6 3.5 - 5.0 g/dL   AST 22 15 - 41 U/L   ALT 17 0 - 44 U/L   Alkaline Phosphatase 97 38 - 126 U/L   Total Bilirubin 0.4 0.0 - 1.2 mg/dL   GFR, Estimated >39 >39 mL/min    Comment: (NOTE) Calculated using the CKD-EPI Creatinine Equation (2021)    Anion gap 12 5 - 15    Comment: Performed at [facility], [address], [place], [place] [address]  Protime-INR     Status: None   Collection Time: 02/07/24  8:58 PM  Result Value Ref Range   Prothrombin Time 12.7 11.4 - 15.2 seconds   INR 0.9 0.8 - 1.2    Comment: (NOTE) INR goal varies based on device and disease states. Performed at [facility], [address] [place], [place] [address]   Urinalysis, w/ Reflex to Culture (Infection Suspected) -Urine, Clean Catch     Status: Abnormal   Collection Time: 02/08/24 12:18 AM  Result Value Ref Range   Specimen Source URINE, CLEAN CATCH    Color, Urine COLORLESS (A) YELLOW   APPearance CLEAR CLEAR   Specific Gravity, Urine 1.009 1.005 - 1.030   pH 6.0 5.0 - 8.0   Glucose, UA NEGATIVE NEGATIVE mg/dL   Hgb urine dipstick NEGATIVE NEGATIVE   Bilirubin Urine NEGATIVE NEGATIVE   Ketones, ur NEGATIVE NEGATIVE mg/dL   Protein, ur NEGATIVE NEGATIVE mg/dL   Nitrite NEGATIVE NEGATIVE   Leukocytes,Ua NEGATIVE NEGATIVE   RBC / HPF 0-5 0 - 5 RBC/hpf   WBC, UA 0-5 0 - 5 WBC/hpf    Comment:        Reflex urine culture not performed if WBC <=10, OR if Squamous epithelial cells >5. If Squamous epithelial cells >5 suggest recollection.    Bacteria, UA NONE SEEN NONE SEEN   Squamous Epithelial / HPF 0-5 0 - 5 /HPF    Comment: Performed at [facility], [address], [place], [place] [address]  CBC with Differential/Platelet     Status: Abnormal   Collection Time: 02/08/24  8:12 AM  Result Value Ref Range   WBC 6.1 4.0 - 10.5 K/uL   RBC  3.51 (L) 3.87 - 5.11 MIL/uL   Hemoglobin 10.8 (L) 12.0 - 15.0 g/dL   HCT 67.2 (L) 63.9 - 53.9 %   MCV 93.2 80.0 - 100.0 fL   MCH 30.8 26.0 - 34.0 pg   MCHC 33.0 30.0 - 36.0 g/dL   RDW 87.4 88.4 - 84.4 %   Platelets 460 (H) 150 - 400 K/uL   nRBC 0.0 0.0 - 0.2 %   Neutrophils Relative % 74 %   Neutro Abs 4.6 1.7 - 7.7 K/uL   Lymphocytes Relative 11 %   Lymphs Abs 0.7 0.7 - 4.0 K/uL   Monocytes Relative 11 %   Monocytes Absolute 0.7 0.1 - 1.0 K/uL   Eosinophils Relative 2 %   Eosinophils Absolute 0.1 0.0 - 0.5 K/uL   Basophils Relative 1 %   Basophils Absolute 0.0 0.0 - 0.1 K/uL   Immature Granulocytes 1 %   Abs Immature Granulocytes 0.05 0.00 - 0.07 K/uL    Comment: Performed at [facility], [address], [place], [place] [address]  Basic metabolic panel     Status: Abnormal   Collection Time: 02/08/24  8:12 AM  Result Value Ref Range   Sodium 140 135 - 145 mmol/L   Potassium 3.4 (L) 3.5 - 5.1 mmol/L  Chloride 102 98 - 111 mmol/L   CO2 26 22 - 32 mmol/L   Glucose, Bld 104 (H) 70 - 99 mg/dL    Comment: Glucose reference range applies only to samples taken after fasting for at least 8 hours.   BUN 9 8 - 23 mg/dL   Creatinine, Ser 9.60 (L) 0.44 - 1.00 mg/dL   Calcium  9.0 8.9 - 10.3 mg/dL   GFR, Estimated >39 >39 mL/min    Comment: (NOTE) Calculated using the CKD-EPI Creatinine Equation (2021)    Anion gap 12 5 - 15    Comment: Performed at [facility], [address], [place], [place] [address]  Basic metabolic panel     Status: Abnormal   Collection Time: 02/09/24  5:50 AM  Result Value Ref Range   Sodium 135 135 - 145 mmol/L   Potassium 3.7 3.5 - 5.1 mmol/L   Chloride 99 98 - 111 mmol/L   CO2 28 22 - 32 mmol/L   Glucose, Bld 103 (H) 70 - 99 mg/dL    Comment: Glucose reference range applies only to samples taken after fasting for at least 8 hours.   BUN 14 8 - 23 mg/dL   Creatinine, Ser 9.31 0.44 - 1.00 mg/dL   Calcium  8.7 (L)  8.9 - 10.3 mg/dL   GFR, Estimated >39 >39 mL/min    Comment: (NOTE) Calculated using the CKD-EPI Creatinine Equation (2021)    Anion gap 8 5 - 15    Comment: Performed at [facility], [address] [place], [place] [address]  CBC     Status: Abnormal   Collection Time: 02/09/24  5:50 AM  Result Value Ref Range   WBC 7.7 4.0 - 10.5 K/uL   RBC 3.44 (L) 3.87 - 5.11 MIL/uL   Hemoglobin 10.5 (L) 12.0 - 15.0 g/dL   HCT 66.7 (L) 63.9 - 53.9 %   MCV 96.5 80.0 - 100.0 fL   MCH 30.5 26.0 - 34.0 pg   MCHC 31.6 30.0 - 36.0 g/dL   RDW 87.2 88.4 - 84.4 %   Platelets 495 (H) 150 - 400 K/uL   nRBC 0.0 0.0 - 0.2 %    Comment: Performed at [facility], [address] [place], [place] [address]  Basic Metabolic Panel     Status: Abnormal   Collection Time: 02/17/24  3:35 PM  Result Value Ref Range   Sodium 132 (L) 135 - 145 mEq/L   Potassium 4.1 3.5 - 5.1 mEq/L   Chloride 98 96 - 112 mEq/L   CO2 25 19 - 32 mEq/L   Glucose, Bld 100 (H) 70 - 99 mg/dL   BUN 17 6 - 23 mg/dL   Creatinine, Ser 9.22 0.40 - 1.20 mg/dL   GFR 30.38 >39.99 mL/min    Comment: Calculated using the CKD-EPI Creatinine Equation (2021)   Calcium  9.0 8.4 - 10.5 mg/dL  Lipase     Status: None   Collection Time: 02/23/24  2:23 PM  Result Value Ref Range   Lipase 44.0 11.0 - 59.0 U/L  Sedimentation rate     Status: None   Collection Time: 02/23/24  2:23 PM  Result Value Ref Range   Sed Rate 11 0 - 30 mm/hr  Comprehensive metabolic panel with GFR     Status: Abnormal   Collection Time: 02/23/24  2:23 PM  Result Value Ref Range   Sodium 131 (L) 135 - 145 mEq/L   Potassium 4.3 3.5 -  5.1 mEq/L   Chloride 96 96 - 112 mEq/L   CO2 30 19 - 32 mEq/L   Glucose, Bld 95 70 - 99 mg/dL   BUN 14 6 - 23 mg/dL   Creatinine, Ser 9.19 0.40 - 1.20 mg/dL   Total Bilirubin 0.3 0.2 - 1.2 mg/dL   Alkaline Phosphatase 111 39 - 117 U/L   AST 15 0 - 37 U/L   ALT 8 0 - 35 U/L   Total Protein 6.7 6.0 -  8.3 g/dL   Albumin 4.0 3.5 - 5.2 g/dL   GFR 33.50 >39.99 mL/min    Comment: Calculated using the CKD-EPI Creatinine Equation (2021)   Calcium  9.0 8.4 - 10.5 mg/dL  Vitamin A87     Status: Abnormal   Collection Time: 02/23/24  2:23 PM  Result Value Ref Range   Vitamin B-12 124 (L) 211 - 911 pg/mL  IBC + Ferritin     Status: Abnormal   Collection Time: 02/23/24  2:23 PM  Result Value Ref Range   Iron 30 (L) 42 - 145 ug/dL   Transferrin 777.9 787.9 - 360.0 mg/dL   Saturation Ratios 9.7 (L) 20.0 - 50.0 %   Ferritin 92.0 10.0 - 291.0 ng/mL   TIBC 310.8 250.0 - 450.0 mcg/dL  CBC with Differential/Platelet     Status: Abnormal   Collection Time: 02/23/24  2:23 PM  Result Value Ref Range   WBC 5.8 4.0 - 10.5 K/uL   RBC 3.85 (L) 3.87 - 5.11 Mil/uL   Hemoglobin 11.6 (L) 12.0 - 15.0 g/dL   HCT 64.9 (L) 63.9 - 53.9 %   MCV 90.8 78.0 - 100.0 fl   MCHC 33.1 30.0 - 36.0 g/dL   RDW 86.0 88.4 - 84.4 %   Platelets 410.0 (H) 150.0 - 400.0 K/uL   Neutrophils Relative % 69.2 43.0 - 77.0 %   Lymphocytes Relative 15.0 12.0 - 46.0 %   Monocytes Relative 10.7 3.0 - 12.0 %   Eosinophils Relative 4.1 0.0 - 5.0 %   Basophils Relative 1.0 0.0 - 3.0 %   Neutro Abs 4.0 1.4 - 7.7 K/uL   Lymphs Abs 0.9 0.7 - 4.0 K/uL   Monocytes Absolute 0.6 0.1 - 1.0 K/uL   Eosinophils Absolute 0.2 0.0 - 0.7 K/uL   Basophils Absolute 0.1 0.0 - 0.1 K/uL  CBC with Differential (Cancer Center Only)     Status: None   Collection Time: 04/06/24 11:53 AM  Result Value Ref Range   WBC Count 5.9 4.0 - 10.5 K/uL   RBC 4.20 3.87 - 5.11 MIL/uL   Hemoglobin 12.4 12.0 - 15.0 g/dL   HCT 61.7 63.9 - 53.9 %   MCV 91.0 80.0 - 100.0 fL   MCH 29.5 26.0 - 34.0 pg   MCHC 32.5 30.0 - 36.0 g/dL   RDW 86.8 88.4 - 84.4 %   Platelet Count 267 150 - 400 K/uL   nRBC 0.0 0.0 - 0.2 %   Neutrophils Relative % 70 %   Neutro Abs 4.1 1.7 - 7.7 K/uL   Lymphocytes Relative 18 %   Lymphs Abs 1.1 0.7 - 4.0 K/uL   Monocytes Relative 9 %    Monocytes Absolute 0.5 0.1 - 1.0 K/uL   Eosinophils Relative 2 %   Eosinophils Absolute 0.1 0.0 - 0.5 K/uL   Basophils Relative 1 %   Basophils Absolute 0.1 0.0 - 0.1 K/uL   Immature Granulocytes 0 %   Abs Immature Granulocytes 0.01 0.00 - 0.07  K/uL    Comment: Performed at [facility], [address] [place], [place] [address]  CMP (Cancer Center only)     Status: Abnormal   Collection Time: 04/06/24 11:53 AM  Result Value Ref Range   Sodium 138 135 - 145 mmol/L   Potassium 4.0 3.5 - 5.1 mmol/L   Chloride 104 98 - 111 mmol/L   CO2 30 22 - 32 mmol/L   Glucose, Bld 85 70 - 99 mg/dL    Comment: Glucose reference range applies only to samples taken after fasting for at least 8 hours.   BUN 25 (H) 8 - 23 mg/dL   Creatinine 9.31 9.55 - 1.00 mg/dL   Calcium  9.1 8.9 - 10.3 mg/dL   Total Protein 6.6 6.5 - 8.1 g/dL   Albumin 3.9 3.5 - 5.0 g/dL   AST 18 15 - 41 U/L   ALT 11 0 - 44 U/L   Alkaline Phosphatase 93 38 - 126 U/L   Total Bilirubin 0.4 0.0 - 1.2 mg/dL   GFR, Estimated >39 >39 mL/min    Comment: (NOTE) Calculated using the CKD-EPI Creatinine Equation (2021)    Anion gap 4 (L) 5 - 15    Comment: Performed at [facility], [address], [place], [place] [address]  Iron and Iron Binding Capacity ([facility] only)     Status: None   Collection Time: 04/06/24 11:53 AM  Result Value Ref Range   Iron 65 28 - 170 ug/dL   TIBC 660 749 - 549 ug/dL   Saturation Ratios 19 10.4 - 31.8 %   UIBC 274 148 - 442 ug/dL    Comment: Performed at [facility], [address], [place], [place] [address]  Vitamin B12     Status: None   Collection Time: 04/06/24 11:53 AM  Result Value Ref Range   Vitamin B-12 448 180 - 914 pg/mL    Comment: Performed at [facility], [address] [place], [place] [address]  Methylmalonic acid, serum     Status: None   Collection Time: 04/06/24 11:53 AM  Result Value  Ref Range   Methylmalonic Acid, Quantitative 338 0 - 378 nmol/L    Comment: (NOTE) This test was developed and its performance characteristics determined by [facility]. It has not been cleared or approved by the Food and Drug Administration. Performed At: [facility] [address] [place], [place] [id number] Jennette Shorter MD Ey:[id number]   Haptoglobin     Status: None   Collection Time: 04/06/24 11:53 AM  Result Value Ref Range   Haptoglobin 164 41 - 333 mg/dL    Comment: (NOTE) Performed At: [facility] [address] [place], [place] [id number] Jennette Shorter MD Ey:[id number]   Ferritin     Status: None   Collection Time: 04/06/24 11:54 AM  Result Value Ref Range   Ferritin 68 11 - 307 ng/mL    Comment: Performed at [facility], [address] [place], [place] [address]  Folate     Status: None   Collection Time: 04/06/24 11:54 AM  Result Value Ref Range   Folate >20.0 >5.9 ng/mL    Comment: Performed at [facility], [address] [place], [place] [address]  Lactate dehydrogenase     Status: None   Collection Time: 04/06/24 11:55 AM  Result Value Ref Range   LDH 162 98 - 192 U/L    Comment: Performed at [facility], [address], [place], [place] [address]     RADIOGRAPHIC STUDIES:   No recent pertinent imaging studies available to review.  Orders Placed This Encounter  Procedures   CBC with Differential (Cancer Center Only)    Standing Status:   Future    Expected Date:   08/16/2024    Expiration Date:   11/14/2024   Iron and Iron Binding Capacity ([facility] only)    Standing Status:   Future    Expected Date:   08/16/2024    Expiration Date:   11/14/2024   Ferritin    Standing Status:   Future    Expected Date:   08/16/2024    Expiration Date:   11/14/2024   Vitamin B12    Standing Status:   Future    Expected Date:   08/16/2024    Expiration Date:   11/14/2024     Future  Appointments  Date Time Provider Department Center  04/17/2024  1:15 PM Valdemar Rogue, MD [facility] None  05/29/2024  1:20 PM Thapa, [place], MD [facility] None  08/16/2024 11:00 AM [facility] None  08/16/2024 11:30 AM Cheridan Kibler, [place], MD [facility] None  08/18/2024 10:00 AM [facility]ANNUAL WELLNESS VISIT [facility] [address]  08/22/2024  1:00 PM [name] HERO, MD [facility] [address]    This document was completed utilizing speech recognition software. Grammatical errors, random word insertions, pronoun errors, and incomplete sentences are an occasional consequence of this system due to software limitations, ambient noise, and hardware issues. Any formal questions or concerns about the content, text or information contained within the body of this dictation should be directly addressed to the provider for clarification.

## 2024-04-17 ENCOUNTER — Ambulatory Visit (INDEPENDENT_AMBULATORY_CARE_PROVIDER_SITE_OTHER): Admitting: Ophthalmology

## 2024-04-17 ENCOUNTER — Encounter (INDEPENDENT_AMBULATORY_CARE_PROVIDER_SITE_OTHER): Payer: Self-pay | Admitting: Ophthalmology

## 2024-04-17 DIAGNOSIS — H35033 Hypertensive retinopathy, bilateral: Secondary | ICD-10-CM

## 2024-04-17 DIAGNOSIS — H353231 Exudative age-related macular degeneration, bilateral, with active choroidal neovascularization: Secondary | ICD-10-CM

## 2024-04-17 DIAGNOSIS — H353221 Exudative age-related macular degeneration, left eye, with active choroidal neovascularization: Secondary | ICD-10-CM

## 2024-04-17 DIAGNOSIS — I10 Essential (primary) hypertension: Secondary | ICD-10-CM

## 2024-04-17 DIAGNOSIS — H26492 Other secondary cataract, left eye: Secondary | ICD-10-CM

## 2024-04-17 DIAGNOSIS — H353211 Exudative age-related macular degeneration, right eye, with active choroidal neovascularization: Secondary | ICD-10-CM

## 2024-04-17 DIAGNOSIS — Z961 Presence of intraocular lens: Secondary | ICD-10-CM | POA: Diagnosis not present

## 2024-04-17 DIAGNOSIS — H04123 Dry eye syndrome of bilateral lacrimal glands: Secondary | ICD-10-CM

## 2024-04-18 ENCOUNTER — Ambulatory Visit: Payer: Self-pay | Admitting: *Deleted

## 2024-04-18 ENCOUNTER — Encounter (INDEPENDENT_AMBULATORY_CARE_PROVIDER_SITE_OTHER): Payer: Self-pay | Admitting: Ophthalmology

## 2024-04-18 DIAGNOSIS — H353231 Exudative age-related macular degeneration, bilateral, with active choroidal neovascularization: Secondary | ICD-10-CM | POA: Diagnosis not present

## 2024-04-18 DIAGNOSIS — M549 Dorsalgia, unspecified: Secondary | ICD-10-CM

## 2024-04-18 MED ORDER — AFLIBERCEPT 2MG/0.05ML IZ SOLN FOR KALEIDOSCOPE
2.0000 mg | INTRAVITREAL | Status: AC | PRN
  Administered 2024-04-18: 2 mg via INTRAVITREAL
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
## 2024-04-20 ENCOUNTER — Ambulatory Visit: Payer: Self-pay
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
## 2024-04-20 NOTE — Telephone Encounter (Signed)
 Ok to send referral

## 2024-04-21 NOTE — Addendum Note (Signed)
 Addended by: CHRISTYNE IDELL LABOR on: 04/21/2024 02:19 PM   Modules accepted: Orders

## 2024-04-21 NOTE — Telephone Encounter (Signed)
 See prior phone note.

## 2024-04-21 NOTE — Telephone Encounter (Signed)
 Patient is aware the referral was placed as below.

## 2024-04-24 ENCOUNTER — Other Ambulatory Visit: Payer: Self-pay | Admitting: Family Medicine

## 2024-04-24 ENCOUNTER — Other Ambulatory Visit: Payer: Self-pay | Admitting: Cardiology

## 2024-04-24 DIAGNOSIS — E039 Hypothyroidism, unspecified: Secondary | ICD-10-CM

## 2024-04-26 ENCOUNTER — Ambulatory Visit: Admitting: Family Medicine

## 2024-04-26 ENCOUNTER — Ambulatory Visit (INDEPENDENT_AMBULATORY_CARE_PROVIDER_SITE_OTHER)
Admission: RE | Admit: 2024-04-26 | Discharge: 2024-04-26 | Disposition: A | Discharge status: Home / Self Care | Source: Ambulatory Visit | Attending: Family Medicine | Admitting: Family Medicine

## 2024-04-26 ENCOUNTER — Encounter: Payer: Self-pay | Admitting: Family Medicine

## 2024-04-26 VITALS — BP 110/70 | HR 52 | Temp 98.1°F | Ht 63.0 in | Wt 123.7 lb

## 2024-04-26 DIAGNOSIS — Z23 Encounter for immunization: Secondary | ICD-10-CM

## 2024-04-26 DIAGNOSIS — M546 Pain in thoracic spine: Secondary | ICD-10-CM | POA: Diagnosis not present

## 2024-04-26 DIAGNOSIS — G8929 Other chronic pain: Secondary | ICD-10-CM

## 2024-04-26 MED ORDER — TIZANIDINE HCL 4 MG PO TABS: 4.0000 mg | ORAL_TABLET | Freq: Four times a day (QID) | ORAL | 0 refills | Status: AC | PRN

## 2024-04-26 MED ORDER — COVID-19 MRNA VACC (MODERNA) 50 MCG/0.5ML IM SUSY: 0.5000 mL | PREFILLED_SYRINGE | Freq: Once | INTRAMUSCULAR | 0 refills | Status: AC

## 2024-04-26 MED ORDER — TRAMADOL HCL 50 MG PO TABS: 50.0000 mg | ORAL_TABLET | Freq: Three times a day (TID) | ORAL | 0 refills | Status: AC | PRN
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
## 2024-05-02 ENCOUNTER — Ambulatory Visit: Payer: Self-pay | Admitting: Family Medicine

## 2024-05-06 ENCOUNTER — Other Ambulatory Visit: Payer: Self-pay | Admitting: Family Medicine

## 2024-05-06 DIAGNOSIS — I10 Essential (primary) hypertension: Secondary | ICD-10-CM
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
## 2024-05-17 ENCOUNTER — Encounter: Payer: Self-pay | Admitting: Physical Therapy

## 2024-05-17 ENCOUNTER — Ambulatory Visit: Admitting: Physical Therapy

## 2024-05-17 DIAGNOSIS — M546 Pain in thoracic spine: Secondary | ICD-10-CM

## 2024-05-17 DIAGNOSIS — R293 Abnormal posture: Secondary | ICD-10-CM

## 2024-05-24 ENCOUNTER — Encounter: Payer: Self-pay | Admitting: Physical Therapy

## 2024-05-24 ENCOUNTER — Ambulatory Visit: Admitting: Physical Therapy

## 2024-05-24 DIAGNOSIS — M546 Pain in thoracic spine: Secondary | ICD-10-CM | POA: Diagnosis not present

## 2024-05-24 DIAGNOSIS — R293 Abnormal posture: Secondary | ICD-10-CM | POA: Diagnosis not present
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
## 2024-05-29 ENCOUNTER — Telehealth: Payer: Self-pay

## 2024-05-29 ENCOUNTER — Encounter: Payer: Self-pay | Admitting: Endocrinology

## 2024-05-29 ENCOUNTER — Other Ambulatory Visit

## 2024-05-29 ENCOUNTER — Ambulatory Visit: Admitting: Endocrinology

## 2024-05-29 VITALS — BP 124/80 | HR 68 | Ht 63.0 in | Wt 126.0 lb

## 2024-05-29 DIAGNOSIS — M810 Age-related osteoporosis without current pathological fracture: Secondary | ICD-10-CM

## 2024-05-29 DIAGNOSIS — E559 Vitamin D deficiency, unspecified: Secondary | ICD-10-CM | POA: Diagnosis not present

## 2024-05-29 LAB — VITAMIN D 25 HYDROXY (VIT D DEFICIENCY, FRACTURES): Vit D, 25-Hydroxy: 33 ng/mL (ref 30–100)

## 2024-05-29 MED ORDER — DENOSUMAB 60 MG/ML ~~LOC~~ SOSY: 60.0000 mg | PREFILLED_SYRINGE | Freq: Once | SUBCUTANEOUS | Status: AC

## 2024-05-29 NOTE — Telephone Encounter (Signed)
[place] start

## 2024-05-29 NOTE — Progress Notes (Unsigned)
 Outpatient Endocrinology Note Ziaire Bieser, MD   Patient's Name: Kaitlyn Parks    DOB: Aug 08, 1934    MRN: [id number]  REASON OF VISIT: New consult for osteoporosis  REFERRING PROVIDER: [name] [name], MD  PCP: [name] [name], MD  HISTORY OF PRESENT ILLNESS:   Kaitlyn Parks is a 89 y.o. old female with past medical history listed below, is here for new consult of bone health issues / osteoporosis.  Pertinent Bone Health History: Patient is referred to endocrinology for evaluation and management of osteoporosis, initial consult on May 29, 2024.  She was seen in this clinic in 2016 by Dr. Trixie.  She lost follow-up with endocrinology.  Patient was diagnosed with osteoporosis around 2006.  She had taken Fosamax years ago, she had taken Prolia  around 2015 for about 2 years, 4 doses, husband had AMI and stroke, a lot of stress and Prolia  was not continued after that.   Bone Health Concerns:   Patient has been taking multivitamin and eye vitamin daily, she does not take additional calcium  supplement.  No history of kidney stone.  No history of hyperparathyroidism.  She has hypothyroidism on thyroid  hormone replacement.  No history of hypothyroidism.  No known history of malabsorption.  No CKD.  She had menopause at the age of 30s.  No known family history of osteoporosis or fracture.  She has history of coronary artery disease status post LAD stent in 2011.  She had normal parathyroid hormone and serum calcium .  She had received a steroid injection every 3 months for the last 3 years into the hip.  She has not been receiving after hip replacement in June 2025.  Fracture history: No fragility fracture.  She has history of left hip replacement due to osteoarthritis in June 2025.  She has history of right hip replacement in 2018.  Osteoporosis medications: Fosamax years ago, Prolia  around 2015 timeframe for 2 years total.  She has been taking multivitamin 1 tablet and IV  multivitamin 1 tablet daily.  Does not take additional calcium  supplement.  Eating cheese daily, not much milk and yogurt.  Relevant comorbidities: No history of bone cancer. No history of external radiation treatment.   No history of diabetes mellitus.  No Rheumatoid arthritis. No history of GERD.  No history of recent invasive dental procedures. Not planning dental procedures in the near future.  Imagings: She had DEXA scan on May 2012 2025 at [facility]ography of [facility] Reviewed report is scanned into media BMD had 1/3 radius 0.396 with T-score of -5.0, left femoral neck -1.9 T-score, left total femur -3.0 T-score.  Lumbar spine was excluded due to degenerative changes.  Overall consistent with osteoporosis.  There has been decline in bone mineral density compared to prior DEXA scan in February 2020.  # Primary hypothyroidism, on Synthroid  50 mcg daily, normal thyroid  function test of TSH 1.68 in March 2025, managed by PCP.  Interval history  Patient presented for evaluation and management of osteoporosis.  Initial consult today.  REVIEW OF SYSTEMS:  As per history of present illness.   PAST MEDICAL HISTORY: Past Medical History:  Diagnosis Date   Arthritis    DDD, scoliosis, sees Dr. Mavis for this, uses norco very rarely for pain   Arthritis    lumbar stenosis, gets steroid injections for [facility]   Bradycardia 11/11/2017   CAD (coronary artery disease)    LAD stenting of a 90% lesion 2012   Cystocele    Educated  about COVID-19 virus infection 12/06/2019   Elevated cholesterol    GERD (gastroesophageal reflux disease)    dx in work up 2016 for atypical CP at OSH   pt. denies   Heart murmur    Hypertensive retinopathy    OU   Hypothyroidism    Interstitial cystitis    sees Dr. Watt   Macular degeneration    OU   Neuromuscular disorder [facility])    neuropathy   NSTEMI (non-ST elevated myocardial infarction) [facility])    Osteoporosis    Rectocele    S/P  hip replacement, right 06/12/2017   Scoliosis    Thyroid  disease    Hypothyroid   Urinary incontinence    USI   Uterine prolapse     PAST SURGICAL HISTORY: Past Surgical History:  Procedure Laterality Date   BLADDER SUSPENSION  2011   CATARACT EXTRACTION Bilateral 2015   Dr. Cleatus   CORONARY ANGIOPLASTY WITH STENT PLACEMENT  04/21/2010   LAD 80%, RCA 30%, nl EF, s/p DES LAD   EYE SURGERY     LEFT HEART CATH AND CORONARY ANGIOGRAPHY N/A 06/14/2017   Procedure: LEFT HEART CATH AND CORONARY ANGIOGRAPHY;  Surgeon: Court Dorn PARAS, MD;  Location: [facility] INVASIVE CV LAB;  Service: Cardiovascular;  Laterality: N/A;   OOPHORECTOMY  2011   BSO   TOTAL HIP ARTHROPLASTY Right 06/10/2017   Procedure: RIGHT TOTAL HIP ARTHROPLASTY ANTERIOR APPROACH;  Surgeon: Fidel Rogue, MD;  Location: [facility] ORS;  Service: Orthopedics;  Laterality: Right;  Needs RNFA   TOTAL HIP ARTHROPLASTY Left 01/27/2024   Procedure: ARTHROPLASTY, HIP, TOTAL, ANTERIOR APPROACH;  Surgeon: Fidel Rogue, MD;  Location: [facility] ORS;  Service: Orthopedics;  Laterality: Left;   VAGINAL HYSTERECTOMY  2011   LAVH BSO; benign    ALLERGIES: Allergies  Allergen Reactions   Acyclovir And Related Other (See Comments)    unknown   Ciprofloxacin  Other (See Comments)    dizziness   Pravachol  [Pravastatin  Sodium] Other (See Comments)    cystitis   Sulfa  Antibiotics Nausea Only    nausea   Zocor  [Simvastatin ] Other (See Comments)    cystitis    FAMILY HISTORY:  Family History  Problem Relation Age of Onset   Hypertension Mother    Heart disease Mother    Heart disease Father    Heart disease Sister    Diabetes Sister    Uterine cancer Sister        mets to lungs   Lung cancer Sister    Scoliosis Sister    Heart disease Brother    Colon cancer Paternal Aunt 19   Breast cancer Paternal Aunt        Age 48's   Breast cancer Paternal Aunt    Leukemia Paternal Aunt    Lung cancer Paternal Grandfather        smoker    Arthritis Daughter    Breast cancer Cousin        Maternal 1st cousins-Age 68's   Esophageal cancer Neg Hx    Pancreatic cancer Neg Hx    Stomach cancer Neg Hx     SOCIAL HISTORY: Social History   Socioeconomic History   Marital status: Widowed    Spouse name: Not on file   Number of children: 2   Years of education: Not on file   Highest education level: Associate degree: occupational, scientist, product/process development, or vocational program  Occupational History   Occupation: retired  Tobacco Use   Smoking status: Never   Smokeless tobacco:  Never  Vaping Use   Vaping status: Never Used  Substance and Sexual Activity   Alcohol  use: No    Comment: Rare   Drug use: No   Sexual activity: Never    Birth control/protection: Surgical  Other Topics Concern   Not on file  Social History Narrative   Work or School:  none      Home Situation: lives with husband      Spiritual Beliefs: Methodist      Lifestyle: regular exercise (yoga, water  aerobics); diet is healthy      Social Drivers of Corporate Investment Banker Strain: Low Risk  (10/27/2023)   Overall Financial Resource Strain (CARDIA)    Difficulty of Paying Living Expenses: Not hard at all  Food Insecurity: No Food Insecurity (04/06/2024)   Hunger Vital Sign    Worried About Running Out of Food in the Last Year: Never true    Ran Out of Food in the Last Year: Never true  Transportation Needs: No Transportation Needs (04/06/2024)   PRAPARE - Administrator, Civil Service (Medical): No    Lack of Transportation (Non-Medical): No  Physical Activity: Sufficiently Active (08/13/2023)   Exercise Vital Sign    Days of Exercise per Week: 3 days    Minutes of Exercise per Session: 60 min  Stress: No Stress Concern Present (08/13/2023)   Kaitlyn Parks of Occupational Health - Occupational Stress Questionnaire    Feeling of Stress : Not at all  Social Connections: Moderately Integrated (02/08/2024)   Social Connection and Isolation  Panel    Frequency of Communication with Friends and Family: More than three times a week    Frequency of Social Gatherings with Friends and Family: More than three times a week    Attends Religious Services: More than 4 times per year    Active Member of [employer] or Organizations: Yes    Attends Banker Meetings: More than 4 times per year    Marital Status: Widowed    MEDICATIONS:  Current Outpatient Medications  Medication Sig Dispense Refill   Aflibercept  (EYLEA  IZ) 1 Dose by Intravitreal route See admin instructions. Every 7 weeks     amLODipine  (NORVASC ) 2.5 MG tablet Take 1 tablet (2.5 mg total) by mouth daily. 90 tablet 3   Biotin w/ Vitamins C & E (HAIR/SKIN/NAILS PO) Take 1 tablet by mouth daily with lunch.     Brimonidine  Tartrate (LUMIFY ) 0.025 % SOLN Place 1 drop into both eyes every 4 (four) hours as needed (irritated eyes).     Cranberry 400 MG CAPS Take 400 mg by mouth in the morning.     hydrALAZINE  (APRESOLINE ) 10 MG tablet TAKE (1) TABLET TWICE A DAY. MAY TAKE EXTRA DOSE FOR SBP >160 UP TO A TOTAL OF 4 TIMES A DAY AS NEEDED 360 tablet 3   isosorbide  mononitrate (IMDUR ) 60 MG 24 hr tablet TAKE 1 & 1/2 TABLETS by mouth once A DAY. 135 tablet 1   Lidocaine -Menthol  3.5-7 % PTCH Place 1 patch onto the skin 2 (two) times daily as needed (knee/hip pain.).     Multiple Vitamins-Calcium  (ONE-A-DAY WOMENS PO) Take 1 tablet by mouth daily with lunch.     nitroGLYCERIN  (NITROSTAT ) 0.4 MG SL tablet DISSOLVE 1 TABLET UNDER TONGUE IF NEEDED FOR CHEST PAIN. MAY REPEAT IN 5 MINUTES FOR 3 DOSES. 25 tablet 11   NON FORMULARY Get steroid inject for SI joint every 12 wks with Dr. Darlis Leash surgery spine  pantoprazole  (PROTONIX ) 40 MG tablet Take 1 tablet (40 mg total) by mouth daily. 30 tablet 1   rosuvastatin  (CRESTOR ) 10 MG tablet TABLET ONE-HALF TABLET BY MOUTH DAILY 45 tablet 3   SYNTHROID  50 MCG tablet TAKE 1 TABLET EACH DAY. NEED APPOINTMENT FOR ADDITIONAL REFILLS 90  tablet 0   tiZANidine  (ZANAFLEX ) 4 MG tablet Take 1 tablet (4 mg total) by mouth every 6 (six) hours as needed for muscle spasms. 30 tablet 0   Current Facility-Administered Medications  Medication Dose Route Frequency Provider Last Rate Last Admin   [START ON 06/12/2024] denosumab  (PROLIA ) injection 60 mg  60 mg Subcutaneous Once Jackob Crookston, MD        PHYSICAL EXAM: Vitals:   05/29/24 1338  BP: 124/80  Pulse: 68  SpO2: 98%  Weight: 126 lb (57.2 kg)  Height: 5' 3 (1.6 m)   Body mass index is 22.32 kg/m.  Wt Readings from Last 3 Encounters:  05/29/24 126 lb (57.2 kg)  04/26/24 123 lb 11.2 oz (56.1 kg)  04/06/24 122 lb 1.6 oz (55.4 kg)    General: Well developed, well nourished female in no apparent distress.  HEENT: AT/[place], no external lesions. Hearing intact to the spoken word Eyes: Conjunctiva clear and no icterus. Neck: Trachea midline, neck supple without appreciable thyromegaly or lymphadenopathy and no palpable thyroid  nodules Lungs: Clear to auscultation, no wheeze. Respirations not labored Heart: S1S2, Regular in rate and rhythm.  Abdomen: Soft, non tender, non distended, no masses, no striae Neurologic: Alert, oriented, normal speech, deep tendon biceps reflexes normal,  no gross focal neurological deficit Extremities: No pedal pitting edema, no tremors of outstretched hands. No spine tenderness Skin: Warm, color good.  Psychiatric: Does not appear depressed or anxious  PERTINENT HISTORIC LABORATORY AND IMAGING STUDIES:  All pertinent laboratory results were reviewed. Please see HPI also for further details.   Lab Results  Component Value Date   ALKPHOS 93 04/06/2024   ALKPHOS 111 02/23/2024   ALKPHOS 97 02/07/2024     ASSESSMENT / PLAN  1. Age-related osteoporosis without current pathological fracture   2. Vitamin D  deficiency   3. Postmenopausal osteoporosis    Patient has longstanding history of osteoporosis initially diagnosed in ~2006, was treated  with antiresorptive therapy in the past, Fosamax years ago and Prolia  in 2015 timeframe for 2 years, she lost follow-up with endocrinology after that.  Patient has no history of fragility fracture.  She has bilateral hip replacement.  She had DEXA scan in May 2025 consistent with osteoporosis as noted in the HPI.  Patient needs needs pharmacological treatment for osteoporosis.  Discussed options of Prolia  versus Reclast.  She has normal renal function.  Patient prefers to be on Prolia .  She has been taking multivitamin and iron vitamin daily, she does not take additional calcium  supplement.  I reviewed with the patient the potential complications of Prolia  treatment, including the rare but serious osteonecrosis of the jaw and atypical bone fractures.  Plan: - Start Prolia  60 mg subcutaneously now and every 6 months.  Will start prior authorization. - Advised patient to take calcium  500 mg over-the-counter daily in addition to her dietary calcium  with cheese every day. - She has been taking multivitamin and high vitamin daily.  Will check vitamin D  level today if needed will advise for additional vitamin D3 supplement. - Discussed about fall precautions and weightbearing exercise as tolerated.    Anyae was seen today for establish care.  Diagnoses and all orders for this  visit:  Age-related osteoporosis without current pathological fracture -     VITAMIN D  25 Hydroxy (Vit-D Deficiency, Fractures) -     denosumab  (PROLIA ) injection 60 mg  Vitamin D  deficiency -     VITAMIN D  25 Hydroxy (Vit-D Deficiency, Fractures)  Postmenopausal osteoporosis   DISPOSITION Follow up in clinic in 6 months suggested.  All questions answered and patient verbalized understanding of the plan.  Monserratt Knezevic, MD [facility] Endocrinology [facility] [address] [place], [address] [place], [place] [address] Phone # [phone]  At least part of this note was generated using voice recognition  software. Inadvertent word errors may have occurred, which were not recognized during the proofreading process.

## 2024-05-30 ENCOUNTER — Ambulatory Visit: Payer: Self-pay | Admitting: Endocrinology

## 2024-05-31 ENCOUNTER — Encounter: Payer: Self-pay | Admitting: Physical Therapy

## 2024-05-31 ENCOUNTER — Ambulatory Visit: Admitting: Physical Therapy

## 2024-05-31 DIAGNOSIS — M546 Pain in thoracic spine: Secondary | ICD-10-CM | POA: Diagnosis not present

## 2024-05-31 DIAGNOSIS — R293 Abnormal posture: Secondary | ICD-10-CM

## 2024-05-31 NOTE — Telephone Encounter (Signed)
 Prolia  VOB initiated via [email or web address]  Next Prolia  inj DUE: re-start

## 2024-05-31 NOTE — Therapy (Signed)
 OUTPATIENT PHYSICAL THERAPY THORACOLUMBAR NOTE    Patient Name: Kaitlyn Parks MRN: [id number] DOB:[birth year], 89 y.o., female Today's Date: 05/31/2024  END OF SESSION:  PT End of Session - 05/31/24 1306     Visit Number 3    Number of Visits 8    Date for Recertification  07/12/24    Authorization Type [employer] [facility]    Authorization Time Period submitted 05/17/24    PT Start Time 1300    PT Stop Time 1343    PT Time Calculation (min) 43 min    Activity Tolerance Patient tolerated treatment well    Behavior During Therapy [facility] for tasks assessed/performed            Past Medical History:  Diagnosis Date   Arthritis    DDD, scoliosis, sees Dr. Mavis for this, uses norco very rarely for pain   Arthritis    lumbar stenosis, gets steroid injections for [place] [facility]   Bradycardia 11/11/2017   CAD (coronary artery disease)    LAD stenting of a 90% lesion 2012   Cystocele    Educated about COVID-19 virus infection 12/06/2019   Elevated cholesterol    GERD (gastroesophageal reflux disease)    dx in work up 2016 for atypical CP at OSH   pt. denies   Heart murmur    Hypertensive retinopathy    OU   Hypothyroidism    Interstitial cystitis    sees Dr. Watt   Macular degeneration    OU   Neuromuscular disorder [facility])    neuropathy   NSTEMI (non-ST elevated myocardial infarction) (HCC)    Osteoporosis    Rectocele    S/P hip replacement, right 06/12/2017   Scoliosis    Thyroid  disease    Hypothyroid   Urinary incontinence    USI   Uterine prolapse    Past Surgical History:  Procedure Laterality Date   BLADDER SUSPENSION  2011   CATARACT EXTRACTION Bilateral 2015   Dr. Cleatus   CORONARY ANGIOPLASTY WITH STENT PLACEMENT  04/21/2010   LAD 80%, RCA 30%, nl EF, s/p DES LAD   EYE SURGERY     LEFT HEART CATH AND CORONARY ANGIOGRAPHY N/A 06/14/2017   Procedure: LEFT HEART CATH AND CORONARY ANGIOGRAPHY;  Surgeon: Court Dorn PARAS, MD;  Location: [facility] IN[facility]AS[facility];   Service: Cardiovascular;  Laterality: N/A;   OOPHORECTOMY  2011   BSO   TOTAL HIP ARTHROPLASTY Right 06/10/2017   Procedure: RIGHT TOTAL HIP ARTHROPLASTY ANTERIOR APPROACH;  Surgeon: Fidel Rogue, MD;  Location: [facility] ORS;  Service: Orthopedics;  Laterality: Right;  Needs RNFA   TOTAL HIP ARTHROPLASTY Left 01/27/2024   Procedure: ARTHROPLASTY, HIP, TOTAL, ANTERIOR APPROACH;  Surgeon: Fidel Rogue, MD;  Location: [facility] OR[facility];  Service: Orthopedics;  Laterality: Left;   VAGINAL HYSTERECTOMY  2011   LAVH BSO; benign   Patient Active Problem List   Diagnosis Date Noted   Normocytic anemia 04/06/2024   Vomiting and diarrhea 02/08/2024   Primary osteoarthritis of left hip 01/27/2024   S/P total left hip arthroplasty 01/27/2024   Pancreatitis, acute 06/14/2022   Abdominal pain 06/13/2022   Acute pancreatitis 06/13/2022   Hyponatremia 06/13/2022   Gastritis 06/11/2022   Aortic atherosclerosis 12/07/2019   Macular degeneration 01/11/2019   Coronary artery disease involving native coronary artery of native heart without angina pectoris 10/29/2018   Presence of intraocular lens 09/23/2018   Bradycardia 11/11/2017   Dyslipidemia 11/11/2017   CAD (coronary artery disease) 06/12/2017   GERD (  gastroesophageal reflux disease) 06/12/2017   S/P hip replacement, right 06/12/2017   Osteoarthritis of right hip 06/10/2017   CAD S/P percutaneous coronary angioplasty 02/11/2015   Renal insufficiency 02/11/2015   Prolonged Q-T interval on ECG 04/18/2010   Hypertension 08/11/2007   Hypothyroidism (acquired) 06/08/2007   MITRAL VALVE PROLAPSE 06/08/2007    PCP: Ozell Railing MD   REFERRING PROVIDER: Ozell Railing HERO, MD  REFERRING DIAG:  M54.9 (ICD-10-CM) - Back pain, unspecified back location, unspecified back pain laterality, unspecified chronicity    Rationale for Evaluation and Treatment: Rehabilitation  THERAPY DIAG:  Abnormal posture  Pain in thoracic spine  ONSET DATE: ACUTE ON  CHRONIC   SUBJECTIVE:                                                                                                                                                                                           SUBJECTIVE STATEMENT:  I did some yoga last night and it was hard getting off the floor.  No increased pain.  I did my exercises today.    PERTINENT HISTORY:  Osteoporosis with bilateral hip replacement and scoliosis   PAIN:  Are you having pain? Yes: NPRS scale: none    Pain location: L mid back  Pain description: tight, tense Aggravating factors: sitting unsupported Relieving factors: stretching and heat  PRECAUTIONS: Other: osteoporosis, avoid flex/rot combo   RED FLAGS: None   WEIGHT BEARING RESTRICTIONS: No  FALLS:  Has patient fallen in last 6 months? No  LIVING ENVIRONMENT: Lives with: lives with their family daughter lives with her  Lives in: House/apartment Stairs: Yes: External: 12  steps; on right going up and L as well  Has following equipment at home: Single point cane and Walker - 2 wheeled  Has used a walker for her SIJ joint pain   OCCUPATION: retired, was in office environment, raised 2 girls  PLOF: Independent and Leisure: bridge, book clubs, yoga, church  Patient stated the plans are to move to friend's home in the not so distant future.[name]    PATIENT GOALS: Pain reduction  NEXT MD VISIT: As needed  OBJECTIVE:  Note: Objective measures were completed at Evaluation unless otherwise noted.  DIAGNOSTIC FINDINGS:  FINDINGS: AP, lateral, and swimmer's lateral three views thoracic spine. Again noted is a mild thoracic kypholevoscoliosis. No AP listhesis is seen.   There is osteopenia, thoracic spondylosis and multilevel degenerative discs. No acute thoracic spine fracture is evident.   There is dextrorotary lumbar scoliosis apex L2-3, degenerative change lumbar spine.   There is calcification of the transverse aorta. There are  multilevel costochondral calcifications.   IMPRESSION:  1. Osteopenia, degenerative change and kyphoscoliosis. No evidence of acute thoracic spine fracture. 2. Aortic atherosclerosis.  PATIENT SURVEYS:  Modified Oswestry:  MODIFIED OSWESTRY DISABILITY SCALE  Date: 05/17/2024 Score  Pain intensity 3 =  Pain medication provides me with moderate relief from pain.  2. Personal care (washing, dressing, etc.) 0 =  I can take care of myself normally without causing increased pain.  3. Lifting 4 = I can lift only very light weights  4. Walking 3 =  Pain prevents me from walking more than  mile.  5. Sitting 3 =  Pain prevents me from sitting more than  hour.  6. Standing 3 =  Pain prevents me from standing more than 1/2 hour.  7. Sleeping 1 = I can sleep well only by using pain medication.  8. Social Life 1 =  My social life is normal, but it increases my level of pain.  9. Traveling 1 =  I can travel anywhere, but it increases my pain.  10. Employment/ Homemaking 2 = I can perform most of my homemaking/job duties, but pain prevents me from performing more physically stressful activities (eg, lifting, vacuuming).  Total 21/50   Interpretation of scores: Score Category Description  0-20% Minimal Disability The patient can cope with most living activities. Usually no treatment is indicated apart from advice on lifting, sitting and exercise  21-40% Moderate Disability The patient experiences more pain and difficulty with sitting, lifting and standing. Travel and social life are more difficult and they may be disabled from work. Personal care, sexual activity and sleeping are not grossly affected, and the patient can usually be managed by conservative means  41-60% Severe Disability Pain remains the main problem in this group, but activities of daily living are affected. These patients require a detailed investigation  61-80% Crippled Back pain impinges on all aspects of the patient's life. Positive  intervention is required  81-100% Bed-bound These patients are either bed-bound or exaggerating their symptoms  Bluford FORBES Zoe DELENA Karon DELENA, et al. Surgery versus conservative management of stable thoracolumbar fracture: the PRESTO feasibility RCT. [place] ([place]): [employer]; 2021 Nov. [facility] Technology Assessment, No. 25.62.) Appendix 3, Oswestry Disability Index category descriptors. Available from: [email or web address].cz  Minimally Clinically Important Difference (MCID) = 12.8%  COGNITION: Overall cognitive status: Within functional limits for tasks assessed     SENSATION: WFL  MUSCLE LENGTH: Not tested in lower extremity   POSTURE: rounded shoulders, forward head, and increased thoracic kyphosis  PALPATION: Painful left medial scapular border along the rhomboids and middle, lower trapezius.  No pain with deep inhalation.  Increased tension and moderate-sized knots throughout these muscles.  Decreased tolerance to palpation  LUMBAR ROM:   AROM eval  Flexion NT due to OP   Extension 80%   Right lateral flexion 50% stretch  Left lateral flexion 50% stretch  Right rotation 25% min pain L   Left rotation 25% min pain L    (Blank rows = not tested)  LOWER EXTREMITY ROM:   Not tested Upper extremity range of motion: Limited left shoulder flexion abduction and external rotation due to chronic injury  Upper extremity strength grossly 4 out of 5 bilaterally LOWER EXTREMITY MMT:    MMT Right eval Left eval  Hip flexion 5 5  Hip extension    Hip abduction    Hip adduction    Hip internal rotation    Hip external rotation    Knee flexion 5 5  Knee extension  5 5  Ankle dorsiflexion    Ankle plantarflexion    Ankle inversion    Ankle eversion     (Blank rows = not tested)  LUMBAR SPECIAL TESTS:  NT   FUNCTIONAL TESTS:  5 times sit to stand: 13.4 sec  2 minute walk test: Not tested    GAIT: Distance walked: 150 Assistive  device utilized: None Level of assistance: Modified independence Comments: No significant gait deviations other than decreased velocity decreased arm swing small step length  TREATMENT DATE:    [facility] Adult PT Treatment:                                                DATE: 05/31/24 Therapeutic Activity/Exercise: Supine horizontal abduction and flexion yellow band  ER unattached yellow Standing row, extension blue then red, respectively  Triceps ext bands GTB Standing dowel AAROM flexion  Lower trunk rotation x 5 Corner stretch 3 x  Wall push ups 2 x 10 (triceps) Standing scapular protraction Rhomboid stretch  Standing balance tandem, narrow and head turns Sit to stand x 10  added a step for min dynamic movement    [facility] Adult PT Treatment:                                                DATE: 05/24/24 Therapeutic Exercise: Horizontal pulls red band x 2 x 10 (mod to yellow)  Standing horizontal abduction yellow band  Standing row, extension  Therapeutic Activity: Supine dowel OH lift LTR with head turns  Goal post open/close ER looped band x 15 added chest press  Narrow band (looped) yellow OH lift Postural corrections, technique  [facility] Adult PT Treatment:                                                DATE: 05/17/2024 Patient instructed in osteoporosis precautions, posture trigger points and home exercise program HEP : trial reps  Open book left side x 5 Goal post with longitudinal towel to decompress spine Rhomboid stretch cross body, reports pain with this modified to bilateral scapular protraction hands clasped and cervical flexion Corner stretch x 30 seconds                                                                                                                     PATIENT EDUCATION:  Education details: See above Person educated: Patient Education method: Explanation, Demonstration, Tactile cues, Verbal cues, and Handouts Education comprehension: needs further  education  HOME EXERCISE PROGRAM: Access Code: [email or web address] URL: [email or web address] Date: 05/17/2024 Prepared by: Delon Norma  Exercises - Open Book  Chest Stretch on Towel Roll  - 1 x daily - 7 x weekly - 1-2 min  hold - Sidelying Thoracic Rotation with Open Book  - 1 x daily - 7 x weekly - 1 sets - 10 reps - 10 hold - Corner Pec Major Stretch  - 1 x daily - 7 x weekly - 1 sets - 3 reps - 30 hold - Seated Scapular Retraction  - 1 x daily - 7 x weekly - 2 sets - 10 reps - 5 hold - Scapular Protraction  - 1 x daily - 7 x weekly - 2 sets - 10 reps - 5 hold  ASSESSMENT:  CLINICAL IMPRESSION: Patient is noticing improvement in left sided scapular and thoracic pain.  She is responding well to the exercises given. her balance overall is good with standing static and min dynamic exercises.  She was given an insert at the time before her hip surgery which she needed for her scoliosis she wanted to see if she still needed it and have her legs measured next visit to see if anything could be modified. Cont POC.   Patient is a 89 y.o. female who was seen today for physical therapy evaluation and treatment for left-sided thoracic pain felt to be musculoskeletal in nature as a response to degenerative condition related to scoliosis.  OBJECTIVE IMPAIRMENTS: decreased activity tolerance, decreased balance, decreased coordination, decreased endurance, decreased knowledge of condition, decreased knowledge of use of DME, decreased mobility, decreased ROM, decreased strength, increased fascial restrictions, impaired flexibility, impaired UE functional use, improper body mechanics, postural dysfunction, and pain.   ACTIVITY LIMITATIONS: carrying, lifting, sitting, standing, sleeping, and locomotion level  PARTICIPATION LIMITATIONS: meal prep, cleaning, interpersonal relationship, community activity, and church  PERSONAL FACTORS: Age, Time since onset of injury/illness/exacerbation, and 3+  comorbidities: Osteoporosis prescription scoliosis and recent hip replacement 01/2024 [place]  are also affecting patient's functional outcome.   REHAB POTENTIAL: Excellent  CLINICAL DECISION MAKING: Stable/uncomplicated  EVALUATION COMPLEXITY: Low   GOALS: Goals reviewed with patient? Yes  SHORT TERM GOALS: Target date: 06/14/2024    Patient will be able to show independence for initial HEP to include posture, core and hip strength and stability.   Baseline: Goal status: MET   2.  Patient with demo proper hip hinge to preserve spine  Baseline:  Goal status:ongoing   3.  Patient will be able to sit unsupported with minimal increase in pain Baseline:  Goal status: ongoing   3.  Patient will be screened for balance issues and goals set if appropriate  Baseline:  Goal status: MET   LONG TERM GOALS: Target date: 07/12/2024    Pt will be able to report no increased pain with basic personal care ADLs Baseline:  Goal status: INITIAL  2.  Patient will be independent with home exercise program upon discharge for posture and strength Baseline:  Goal status: INITIAL  3.  Patient will be able to sit unsupported for 30 minutes without increased pain Baseline:  Goal status: INITIAL  4.  Patient will be able to report improved sleep with less reliance on medication for both her hip and her middle back Baseline:  Goal status: INITIAL  5.  Pt will be able to demonstrate good standing balance as evidenced by balance recovery from min to mod dynamic balance challenges.   Baseline:  Goal status: INITIAL  6.  Patient will improve modified ODI improved by 12% as a proxy for improved functional mobility Baseline: 21/50 Goal status: INITIAL  PLAN:  PT FREQUENCY: 1x/week  PT DURATION: 8 weeks  PLANNED INTERVENTIONS: 97164- PT Re-evaluation, 97750- Physical Performance Testing, 97110-Therapeutic exercises, 97530- Therapeutic activity, [id number]- Neuromuscular re-education, 97535-  Self Care, [id number]- Manual therapy, [phone]- Gait training, Patient/Family education, Balance training, Taping, Cryotherapy, and Moist heat.  PLAN FOR NEXT SESSION:  Check home exercise program and begin light upper back strengthening row, scapular retraction manual therapy to left scapula thoracic spine as tolerated   Araya Roel, PT 05/31/2024, 1:50 PM

## 2024-06-01 NOTE — Telephone Encounter (Signed)
 [employer] [facility] - Prior Authorization REQUIRED for [facility]    PA PROCESS DETAILS: Effective 08/03/2021 if the patient is new to [facility], Prior authorization and Step  Therapy are required & not on file. Please go to [email or web address]  or call [phone] to  initiate the prior authorization.  For exception to the policy please visit  [email or web address] and review Policy Number [email or web address]

## 2024-06-03 NOTE — Telephone Encounter (Signed)
 [employer]   Prior Authorization for [facility]  APPROVED PA# [id number] Valid: 06/03/24-06/03/25

## 2024-06-07 ENCOUNTER — Encounter: Payer: Self-pay | Admitting: Physical Therapy

## 2024-06-07 ENCOUNTER — Ambulatory Visit: Admitting: Physical Therapy

## 2024-06-07 DIAGNOSIS — R293 Abnormal posture: Secondary | ICD-10-CM

## 2024-06-07 DIAGNOSIS — M546 Pain in thoracic spine: Secondary | ICD-10-CM | POA: Diagnosis not present

## 2024-06-07 NOTE — Therapy (Signed)
 OUTPATIENT PHYSICAL THERAPY THORACOLUMBAR NOTE    Patient Name: Kaitlyn Parks MRN: [id number] DOB:05-Apr-1935, 89 y.o., female Today's Date: 06/07/2024  END OF SESSION:  PT End of Session - 06/07/24 1306     Visit Number 4    Number of Visits 8    Date for Recertification  07/12/24    Authorization Type U[employer] [facility]    Authorization Time Period submitted 05/17/24    PT Start Time 1304    PT Stop Time 1345    PT Time Calculation (min) 41 min    Activity Tolerance Patient tolerated treatment well    Behavior During Therapy [facility] for tasks assessed/performed             Past Medical History:  Diagnosis Date   Arthritis    DDD, scoliosis, sees Dr. Mavis for this, uses norco very rarely for pain   Arthritis    lumbar stenosis, gets steroid injections for [facility]   Bradycardia 11/11/2017   CAD (coronary artery disease)    LAD stenting of a 90% lesion 2012   Cystocele    Educated about COVID-19 virus infection 12/06/2019   Elevated cholesterol    GERD (gastroesophageal reflux disease)    dx in work up 2016 for atypical CP at OSH   pt. denies   Heart murmur    Hypertensive retinopathy    OU   Hypothyroidism    Interstitial cystitis    sees Dr. Watt   Macular degeneration    OU   Neuromuscular disorder [facility])    neuropathy   NSTEMI (non-ST elevated myocardial infarction) (HCC)    Osteoporosis    Rectocele    S/P hip replacement, right 06/12/2017   Scoliosis    Thyroid  disease    Hypothyroid   Urinary incontinence    USI   Uterine prolapse    Past Surgical History:  Procedure Laterality Date   BLADDER SUSPENSION  2011   CATARACT EXTRACTION Bilateral 2015   Dr. Cleatus   CORONARY ANGIOPLASTY WITH STENT PLACEMENT  04/21/2010   LAD 80%, RCA 30%, nl EF, s/p DES LAD   EYE SURGERY     LEFT HEART CATH AND CORONARY ANGIOGRAPHY N/A 06/14/2017   Procedure: LEFT HEART CATH AND CORONARY ANGIOGRAPHY;  Surgeon: Court Dorn PARAS, MD;  Location: [facility] INVASIVE CV LAB;   Service: Cardiovascular;  Laterality: N/A;   OOPHORECTOMY  2011   BSO   TOTAL HIP ARTHROPLASTY Right 06/10/2017   Procedure: RIGHT TOTAL HIP ARTHROPLASTY ANTERIOR APPROACH;  Surgeon: Fidel Rogue, MD;  Location: [facility] ORS;  Service: Orthopedics;  Laterality: Right;  Needs RNFA   TOTAL HIP ARTHROPLASTY Left 01/27/2024   Procedure: ARTHROPLASTY, HIP, TOTAL, ANTERIOR APPROACH;  Surgeon: Fidel Rogue, MD;  Location: [facility] ORS;  Service: Orthopedics;  Laterality: Left;   VAGINAL HYSTERECTOMY  2011   LAVH BSO; benign   Patient Active Problem List   Diagnosis Date Noted   Normocytic anemia 04/06/2024   Vomiting and diarrhea 02/08/2024   Primary osteoarthritis of left hip 01/27/2024   S/P total left hip arthroplasty 01/27/2024   Pancreatitis, acute 06/14/2022   Abdominal pain 06/13/2022   Acute pancreatitis 06/13/2022   Hyponatremia 06/13/2022   Gastritis 06/11/2022   Aortic atherosclerosis 12/07/2019   Macular degeneration 01/11/2019   Coronary artery disease involving native coronary artery of native heart without angina pectoris 10/29/2018   Presence of intraocular lens 09/23/2018   Bradycardia 11/11/2017   Dyslipidemia 11/11/2017   CAD (coronary artery disease) 06/12/2017  GERD (gastroesophageal reflux disease) 06/12/2017   S/P hip replacement, right 06/12/2017   Osteoarthritis of right hip 06/10/2017   CAD S/P percutaneous coronary angioplasty 02/11/2015   Renal insufficiency 02/11/2015   Prolonged Q-T interval on ECG 04/18/2010   Hypertension 08/11/2007   Hypothyroidism (acquired) 06/08/2007   MITRAL VALVE PROLAPSE 06/08/2007    PCP: Ozell Railing MD   REFERRING PROVIDER: Ozell Railing HERO, MD  REFERRING DIAG:  M54.9 (ICD-10-CM) - Back pain, unspecified back location, unspecified back pain laterality, unspecified chronicity    Rationale for Evaluation and Treatment: Rehabilitation  THERAPY DIAG:  Abnormal posture  Pain in thoracic spine  ONSET DATE: ACUTE ON  CHRONIC   SUBJECTIVE:                                                                                                                                                                                           SUBJECTIVE STATEMENT:  I did some yoga last night and it was hard getting off the floor.  No increased pain.  I did my exercises today.    PERTINENT HISTORY:  Osteoporosis with bilateral hip replacement and scoliosis   PAIN:  Are you having pain? Yes: NPRS scale: none    Pain location: L mid back  Pain description: tight, tense Aggravating factors: sitting unsupported Relieving factors: stretching and heat  PRECAUTIONS: Other: osteoporosis, avoid flex/rot combo   RED FLAGS: None   WEIGHT BEARING RESTRICTIONS: No  FALLS:  Has patient fallen in last 6 months? No  LIVING ENVIRONMENT: Lives with: lives with their family daughter lives with her  Lives in: House/apartment Stairs: Yes: External: 12  steps; on right going up and L as well  Has following equipment at home: Single point cane and Walker - 2 wheeled  Has used a walker for her SIJ joint pain   OCCUPATION: retired, was in office environment, raised 2 girls  PLOF: Independent and Leisure: bridge, book clubs, yoga, church  Patient stated the plans are to move to friend's home in the not so distant future.[name]    PATIENT GOALS: Pain reduction  NEXT MD VISIT: As needed  OBJECTIVE:  Note: Objective measures were completed at Evaluation unless otherwise noted.  DIAGNOSTIC FINDINGS:  FINDINGS: AP, lateral, and swimmer's lateral three views thoracic spine. Again noted is a mild thoracic kypholevoscoliosis. No AP listhesis is seen.   There is osteopenia, thoracic spondylosis and multilevel degenerative discs. No acute thoracic spine fracture is evident.   There is dextrorotary lumbar scoliosis apex L2-3, degenerative change lumbar spine.   There is calcification of the transverse aorta. There are  multilevel costochondral calcifications.  IMPRESSION: 1. Osteopenia, degenerative change and kyphoscoliosis. No evidence of acute thoracic spine fracture. 2. Aortic atherosclerosis.  PATIENT SURVEYS:  Modified Oswestry:  MODIFIED OSWESTRY DISABILITY SCALE  Date: 05/17/2024 Score  Pain intensity 3 =  Pain medication provides me with moderate relief from pain.  2. Personal care (washing, dressing, etc.) 0 =  I can take care of myself normally without causing increased pain.  3. Lifting 4 = I can lift only very light weights  4. Walking 3 =  Pain prevents me from walking more than  mile.  5. Sitting 3 =  Pain prevents me from sitting more than  hour.  6. Standing 3 =  Pain prevents me from standing more than 1/2 hour.  7. Sleeping 1 = I can sleep well only by using pain medication.  8. Social Life 1 =  My social life is normal, but it increases my level of pain.  9. Traveling 1 =  I can travel anywhere, but it increases my pain.  10. Employment/ Homemaking 2 = I can perform most of my homemaking/job duties, but pain prevents me from performing more physically stressful activities (eg, lifting, vacuuming).  Total 21/50   Interpretation of scores: Score Category Description  0-20% Minimal Disability The patient can cope with most living activities. Usually no treatment is indicated apart from advice on lifting, sitting and exercise  21-40% Moderate Disability The patient experiences more pain and difficulty with sitting, lifting and standing. Travel and social life are more difficult and they may be disabled from work. Personal care, sexual activity and sleeping are not grossly affected, and the patient can usually be managed by conservative means  41-60% Severe Disability Pain remains the main problem in this group, but activities of daily living are affected. These patients require a detailed investigation  61-80% Crippled Back pain impinges on all aspects of the patient's life. Positive  intervention is required  81-100% Bed-bound These patients are either bed-bound or exaggerating their symptoms  Bluford FORBES Zoe DELENA Karon DELENA, et al. Surgery versus conservative management of stable thoracolumbar fracture: the PRESTO feasibility RCT. [place]): [employer]; 2021 Nov. [facility] Technology Assessment, No. 25.62.) Appendix 3, Oswestry Disability Index category descriptors. Available from: [email or web address].cz  Minimally Clinically Important Difference (MCID) = 12.8%  COGNITION: Overall cognitive status: Within functional limits for tasks assessed     SENSATION: WFL  MUSCLE LENGTH: Not tested in lower extremity   POSTURE: rounded shoulders, forward head, and increased thoracic kyphosis  PALPATION: Painful left medial scapular border along the rhomboids and middle, lower trapezius.  No pain with deep inhalation.  Increased tension and moderate-sized knots throughout these muscles.  Decreased tolerance to palpation  LUMBAR ROM:   AROM eval  Flexion NT due to OP   Extension 80%   Right lateral flexion 50% stretch  Left lateral flexion 50% stretch  Right rotation 25% min pain L   Left rotation 25% min pain L    (Blank rows = not tested)  LOWER EXTREMITY ROM:   Not tested Upper extremity range of motion: Limited left shoulder flexion abduction and external rotation due to chronic injury  Upper extremity strength grossly 4 out of 5 bilaterally LOWER EXTREMITY MMT:    MMT Right eval Left eval  Hip flexion 5 5  Hip extension    Hip abduction    Hip adduction    Hip internal rotation    Hip external rotation    Knee flexion 5 5  Knee  extension 5 5  Ankle dorsiflexion    Ankle plantarflexion    Ankle inversion    Ankle eversion     (Blank rows = not tested)  LUMBAR SPECIAL TESTS:  NT   FUNCTIONAL TESTS:  5 times sit to stand: 13.4 sec  2 minute walk test: Not tested    GAIT: Distance walked: 150 Assistive  device utilized: None Level of assistance: Modified independence Comments: No significant gait deviations other than decreased velocity decreased arm swing small step length  TREATMENT DATE:   [facility] Adult PT Treatment:                                                DATE: 06/07/24 Therapeutic Exercise/Activity: Row x 15 GTB Extension x 15 GTB Single arm diagonal pull x 10 each arm Lt UE Yellow  Horizontal pull Yellow band  Scaption 2 lb x 10 ER 2 lbs  x 10 Cat and camel Thread the needle x  3 each side  Quadruped extension single arm and then triceps 2 lbs x  10  Sidelying open book  Rt sided stretch for elongation of Rt trunk 1 min   [facility] Adult PT Treatment:                                                DATE: 05/31/24 Therapeutic Activity/Exercise: Supine horizontal abduction and flexion yellow band  ER unattached yellow Standing row, extension blue then red, respectively  Triceps ext bands GTB Standing dowel AAROM flexion  Lower trunk rotation x 5 Corner stretch 3 x  Wall push ups 2 x 10 (triceps) Standing scapular protraction Rhomboid stretch  Standing balance tandem, narrow and head turns Sit to stand x 10  added a step for min dynamic movement    [facility] Adult PT Treatment:                                                DATE: 05/24/24 Therapeutic Exercise: Horizontal pulls red band x 2 x 10 (mod to yellow)  Standing horizontal abduction yellow band  Standing row, extension  Therapeutic Activity: Supine dowel OH lift LTR with head turns  Goal post open/close ER looped band x 15 added chest press  Narrow band (looped) yellow OH lift Postural corrections, technique  [facility] Adult PT Treatment:                                                DATE: 05/17/2024 Patient instructed in osteoporosis precautions, posture trigger points and home exercise program HEP : trial reps  Open book left side x 5 Goal post with longitudinal towel to decompress spine Rhomboid stretch cross  body, reports pain with this modified to bilateral scapular protraction hands clasped and cervical flexion Corner stretch x 30 seconds  PATIENT EDUCATION:  Education details: See above Person educated: Patient Education method: Explanation, Demonstration, Tactile cues, Verbal cues, and Handouts Education comprehension: needs further education  HOME EXERCISE PROGRAM: Access Code: [email or web address] URL: [email or web address] Date: 05/17/2024 Prepared by: Delon Norma  Exercises - Open Book Chest Stretch on Towel Roll  - 1 x daily - 7 x weekly - 1-2 min  hold - Sidelying Thoracic Rotation with Open Book  - 1 x daily - 7 x weekly - 1 sets - 10 reps - 10 hold - Corner Pec Major Stretch  - 1 x daily - 7 x weekly - 1 sets - 3 reps - 30 hold - Seated Scapular Retraction  - 1 x daily - 7 x weekly - 2 sets - 10 reps - 5 hold - Scapular Protraction  - 1 x daily - 7 x weekly - 2 sets - 10 reps - 5 hold  ASSESSMENT:  CLINICAL IMPRESSION: Patient notes much improved in her scapular pain.  She can sleep on that side a bit more (lt shoulder pain) and sits with less pain in her mid back.  See goals in progress.  She has notably less ROM in L UE flexion and external rotation. Rt concave spine, measured her Rt LE to be 34 inches, equal to the L. Side.   She has 1 more visit and may consider DC.   Patient is a 89 y.o. female who was seen today for physical therapy evaluation and treatment for left-sided thoracic pain felt to be musculoskeletal in nature as a response to degenerative condition related to scoliosis.  OBJECTIVE IMPAIRMENTS: decreased activity tolerance, decreased balance, decreased coordination, decreased endurance, decreased knowledge of condition, decreased knowledge of use of DME, decreased mobility, decreased ROM, decreased strength, increased fascial restrictions,  impaired flexibility, impaired UE functional use, improper body mechanics, postural dysfunction, and pain.   ACTIVITY LIMITATIONS: carrying, lifting, sitting, standing, sleeping, and locomotion level  PARTICIPATION LIMITATIONS: meal prep, cleaning, interpersonal relationship, community activity, and church  PERSONAL FACTORS: Age, Time since onset of injury/illness/exacerbation, and 3+ comorbidities: Osteoporosis prescription scoliosis and recent hip replacement 01/2024 [place]  are also affecting patient's functional outcome.   REHAB POTENTIAL: Excellent  CLINICAL DECISION MAKING: Stable/uncomplicated  EVALUATION COMPLEXITY: Low   GOALS: Goals reviewed with patient? Yes  SHORT TERM GOALS: Target date: 06/14/2024    Patient will be able to show independence for initial HEP to include posture, core and hip strength and stability.   Baseline: Goal status: MET   2.  Patient with demo proper hip hinge to preserve spine  Baseline:  Goal status:ongoing   3.  Patient will be able to sit unsupported with minimal increase in pain Baseline:  Goal status: ongoing   3.  Patient will be screened for balance issues and goals set if appropriate  Baseline:  Goal status: MET   LONG TERM GOALS: Target date: 07/12/2024    Pt will be able to report no increased pain with basic personal care ADLs Baseline:  Goal status: ongoing   2.  Patient will be independent with home exercise program upon discharge for posture and strength Baseline:  Goal status: ongoing   3.  Patient will be able to sit unsupported for 30 minutes without increased pain Baseline:  Goal status: ongoing   4.  Patient will be able to report improved sleep with less reliance on medication for both her hip and her middle back Baseline:  Goal status: MET, min shoulder discomfort   5.  Pt  will be able to demonstrate good standing balance as evidenced by balance recovery from min to mod dynamic balance challenges.    Baseline:  Goal status: ongoing   6.  Patient will improve modified ODI improved by 12% as a proxy for improved functional mobility Baseline: 21/50 Goal status: INITIAL  PLAN:  PT FREQUENCY: 1x/week  PT DURATION: 8 weeks  PLANNED INTERVENTIONS: [id number]- PT Re-evaluation, 97750- Physical Performance Testing, 97110-Therapeutic exercises, 97530- Therapeutic activity, [id number]- Neuromuscular re-education, 97535- Self Care, [id number]- Manual therapy, [id number]- Gait training, Patient/Family education, Balance training, Taping, Cryotherapy, and Moist heat.  PLAN FOR NEXT SESSION:  Check home exercise program and begin light upper back strengthening row, scapular retraction manual therapy to left scapula thoracic spine as tolerated   Talana Slatten, PT 06/07/2024, 1:06 PM

## 2024-06-09 NOTE — Telephone Encounter (Signed)
 [employer]  Patient is ready for scheduling on or after 06/03/24  Out-of-pocket cost due at time of visit: $25  Primary: [employer]HC  Medicare Advantage L-PPO [employer]  co-insurance: $25 Admin fee co-insurance: n/a  Deductible: does not apply  Prior Auth: APPROVED PA# [id number] Valid: 06/03/24-06/03/25  Secondary: N/A [employer]  co-insurance:  Admin fee co-insurance:  Deductible:  Prior Auth:  PA# Valid:   ** This summary of benefits is an estimation of the patient's out-of-pocket cost. Exact cost may vary based on individual plan coverage.

## 2024-06-12 NOTE — Progress Notes (Signed)
 [facility] - Clinic Note  06/21/2024    CHIEF COMPLAINT Patient presents for Retina Follow Up  HISTORY OF PRESENT ILLNESS: Kaitlyn Parks is a 89 y.o. female who presents to the clinic today for:   HPI     Retina Follow Up   Patient presents with  Wet AMD.  In left eye.  This started 10 weeks ago.  I, the attending physician,  performed the HPI with the patient and updated documentation appropriately.        Comments   Patient here for 10 weeks retina follow up for exu ARMD OS. Patient states vision about the same. No eye pain. OS stays red a lot of the time. Using drops.      Last edited by Valdemar Rogue, MD on 06/21/2024 11:42 PM.     Patient feels she is seeing fairly well.  Referring physician: [name] [name], MD [address] [place],  [place] [address]  HISTORICAL INFORMATION:  Selected notes from the MEDICAL RECORD NUMBER Referred by Dr. Jordan DeMarco for concern of exu ARMD LEE: 11.26.19 (J. 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 Every 7 weeks   Brimonidine  Tartrate (LUMIFY ) 0.025 % SOLN Place 1 drop into both eyes every 4 (four) hours as needed (irritated eyes).   No current facility-administered medications for this visit. (Ophthalmic Drugs)   Current Outpatient Medications (Other)  Medication Sig   amLODipine  (NORVASC ) 2.5 MG tablet Take 1 tablet (2.5 mg total) by mouth daily.   Biotin w/ Vitamins C & E (HAIR/SKIN/NAILS PO) Take 1 tablet by mouth daily with lunch.   Cranberry 400 MG CAPS Take 400 mg by mouth in the morning.   hydrALAZINE  (APRESOLINE ) 10 MG tablet TAKE (1) TABLET TWICE A DAY. MAY TAKE EXTRA DOSE FOR SBP >160 UP TO A TOTAL OF 4 TIMES A DAY AS NEEDED    isosorbide  mononitrate (IMDUR ) 60 MG 24 hr tablet TAKE 1 & 1/2 TABLETS by mouth once A DAY.   Lidocaine -Menthol  3.5-7 % PTCH Place 1 patch onto the skin 2 (two) times daily as needed (knee/hip pain.).   Multiple Vitamins-Calcium  (ONE-A-DAY WOMENS PO) Take 1 tablet by mouth daily with lunch.   nitroGLYCERIN  (NITROSTAT ) 0.4 MG SL tablet DISSOLVE 1 TABLET UNDER TONGUE IF NEEDED FOR CHEST PAIN. MAY REPEAT IN 5 MINUTES FOR 3 DOSES.   NON FORMULARY Get steroid inject for SI joint every 12 wks with Dr. Darlis Leash surgery spine   rosuvastatin  (CRESTOR ) 10 MG tablet TABLET ONE-HALF TABLET BY MOUTH DAILY   SYNTHROID  50 MCG tablet TAKE 1 TABLET EACH DAY. NEED APPOINTMENT FOR ADDITIONAL REFILLS   tiZANidine  (ZANAFLEX ) 4 MG tablet Take 1 tablet (4 mg total) by mouth every 6 (six) hours as needed for muscle spasms.   pantoprazole  (PROTONIX ) 40 MG tablet Take 1 tablet (40 mg total) by mouth daily.   Current Facility-Administered Medications (Other)  Medication Route   denosumab  (PROLIA ) injection 60 mg Subcutaneous   REVIEW OF SYSTEMS: ROS   Positive for: Gastrointestinal, Genitourinary, Musculoskeletal, Cardiovascular, Eyes Negative for: Constitutional, Neurological, Skin, HENT, Endocrine, Respiratory, Psychiatric, Allergic/Imm, Heme/Lymph Last edited by Orval Asberry RAMAN, COA on 06/21/2024  1:22 PM.      ALLERGIES Allergies  Allergen Reactions   Acyclovir And Related  Other (See Comments)    unknown   Ciprofloxacin  Other (See Comments)    dizziness   Pravachol  [Pravastatin  Sodium] Other (See Comments)    cystitis   Sulfa  Antibiotics Nausea Only    nausea   Zocor  [Simvastatin ] Other (See Comments)    cystitis   PAST MEDICAL HISTORY Past Medical History:  Diagnosis Date   Arthritis    DDD, scoliosis, sees Dr. Mavis for this, uses norco very rarely for pain   Arthritis    lumbar stenosis, gets steroid injections for [place] [facility]   Bradycardia 11/11/2017   CAD (coronary artery  disease)    LAD stenting of a 90% lesion 2012   Cystocele    Educated about COVID-19 virus infection 12/06/2019   Elevated cholesterol    GERD (gastroesophageal reflux disease)    dx in work up 2016 for atypical CP at OSH   pt. denies   Heart murmur    Hypertensive retinopathy    OU   Hypothyroidism    Interstitial cystitis    sees Dr. Watt   Macular degeneration    OU   Neuromuscular disorder [facility])    neuropathy   NSTEMI (non-ST elevated myocardial infarction) (HCC)    Osteoporosis    Rectocele    S/P hip replacement, right 06/12/2017   Scoliosis    Thyroid  disease    Hypothyroid   Urinary incontinence    USI   Uterine prolapse    Past Surgical History:  Procedure Laterality Date   BLADDER SUSPENSION  2011   CATARACT EXTRACTION Bilateral 2015   Dr. Cleatus   CORONARY ANGIOPLASTY WITH STENT PLACEMENT  04/21/2010   LAD 80%, RCA 30%, nl EF, s/p DES LAD   EYE SURGERY     LEFT HEART CATH AND CORONARY ANGIOGRAPHY N/A 06/14/2017   Procedure: LEFT HEART CATH AND CORONARY ANGIOGRAPHY;  Surgeon: Court Dorn PARAS, MD;  Location: [facility] INVAS[facility];  Service: Cardiovascular;  Laterality: N/A;   OOPHORECTOMY  2011   BSO   TOTAL HIP ARTHROPLASTY Right 06/10/2017   Procedure: RIGHT TOTAL HIP ARTHROPLASTY ANTERIOR APPROACH;  Surgeon: Fidel Rogue, MD;  Location: [facility] ORS;  Service: Orthopedics;  Laterality: Right;  Needs RNFA   TOTAL HIP ARTHROPLASTY Left 01/27/2024   Procedure: ARTHROPLASTY, HIP, TOTAL, ANTERIOR APPROACH;  Surgeon: Fidel Rogue, MD;  Location: [facility] OR[facility];  Service: Orthopedics;  Laterality: Left;   VAGINAL HYSTERECTOMY  2011   LAVH BSO; benign   FAMILY HISTORY Family History  Problem Relation Age of Onset   Hypertension Mother    Heart disease Mother    Heart disease Father    Heart disease Sister    Diabetes Sister    Uterine cancer Sister        mets to lungs   Lung cancer Sister    Scoliosis Sister    Heart disease Brother    Colon cancer Paternal Aunt  43   Breast cancer Paternal Aunt        Age 32's   Breast cancer Paternal Aunt    Leukemia Paternal Aunt    Lung cancer Paternal Grandfather        smoker   Arthritis Daughter    Breast cancer Cousin        Maternal 1st cousins-Age 20's   Esophageal cancer Neg Hx    Pancreatic cancer Neg Hx    Stomach cancer Neg Hx    SOCIAL HISTORY Social History   Tobacco Use   Smoking status: Never  Smokeless tobacco: Never  Vaping Use   Vaping status: Never Used  Substance Use Topics   Alcohol  use: No    Comment: Rare   Drug use: No       OPHTHALMIC EXAM: Base Eye Exam     Visual Acuity (Snellen - Linear)       Right Left   Dist [place] 20/25 20/30         Tonometry (Tonopen, 1:20 PM)       Right Left   Pressure 14 15         Pupils       Dark Light Shape React APD   Right 3 2 Round Brisk None   Left 3 2 Round Brisk None         Visual Fields (Counting fingers)       Left Right    Full Full         Extraocular Movement       Right Left    Full, Ortho Full, Ortho         Neuro/Psych     Oriented x3: Yes   Mood/Affect: Normal         Dilation     Both eyes: 1.0% Mydriacyl, 2.5% Phenylephrine  @ 1:20 PM           Slit Lamp and Fundus Exam     Slit Lamp Exam       Right Left   Lids/Lashes mild Telangiectasia -- improved, marginal lesion nasal UL Dermatochalasis - upper lid   Conjunctiva/Sclera White and quiet White and quiet, inferior conj chalasis   Cornea Arcus, 2+ fine Punctate epithelial erosions, tear film debris, decreased TBUT arcus, tear film debris, 2+ inferior Punctate epithelial erosions   Anterior Chamber Deep and clear Deep and clear   Iris Round and dilated Round and well dilated   Lens PC IOL in good position, 1+Posterior capsular opacification IN PC IOL in good position with open PC   Anterior Vitreous Vitreous syneresis, Posterior vitreous detachment Vitreous syneresis, Posterior vitreous detachment, vitreous  condensations         Fundus Exam       Right Left   Disc Pink and Sharp, Compact, focal PPP Pink and Sharp, mild temporal PPA/PPP   C/D Ratio 0.3 0.3   Macula Blunted foveal reflex, Drusen, RPE mottling and clumping, early Atrophy, +PEDs -- stably improved, trace cystic changes -- stably improved, No frank heme, no edema Blunted foveal reflex, +drusen, pigment clumping, RPE mottling, clumping and atrophy, no heme, central PED / CNV with overlying cystic changes -- stably improved, +GA   Vessels attenuated, Tortuous attenuated, Tortuous   Periphery Attached, scattered reticular degeneration Attached, scattered reticular degeneration           IMAGING AND PROCEDURES  Imaging and Procedures for @TODAY @  OCT, Retina - OU - Both Eyes       Right Eye Quality was good. Central Foveal Thickness: 243. Progression has been stable. Findings include no IRF, no SRF, abnormal foveal contour, retinal drusen , intraretinal hyper-reflective material, pigment epithelial detachment, outer retinal atrophy (stable improvement in cystic changes nasal and temporal fovea, patchy ORA / GA -- no fluid).   Left Eye Quality was good. Central Foveal Thickness: 259. Progression has been stable. Findings include normal foveal contour, no IRF, no SRF, retinal drusen , intraretinal hyper-reflective material, pigment epithelial detachment, outer retinal atrophy (Stable improvement in IRF/cystic changes inferior fovea, stable improvement in [facility] / PED inferior to fovea ).  Notes *Images captured and stored on drive  Diagnosis / Impression:  OD: exudative ARMD -- stable improvement in cystic changes nasal and temporal fovea, patchy ORA / GA -- no fluid OS: exu-ARMD -- Stable improvement in IRF/cystic changes inferior fovea, stable improvement in [facility] / PED inferior to fovea   Clinical management:  See below  Abbreviations: NFP - Normal foveal profile. CME - cystoid macular edema. PED - pigment epithelial  detachment. IRF - intraretinal fluid. SRF - subretinal fluid. EZ - ellipsoid zone. ERM - epiretinal membrane. ORA - outer retinal atrophy. ORT - outer retinal tubulation. SRHM - subretinal hyper-reflective material      Intravitreal Injection, Pharmacologic Agent - OS - Left Eye       Time Out 06/21/2024. 2:07 PM. Confirmed correct patient, procedure, site, and patient consented.   Anesthesia Topical anesthesia was used. Anesthetic medications included Lidocaine  2%, Proparacaine 0.5%.   Procedure Preparation included 5% betadine  to ocular surface, eyelid speculum. A (32g) needle was used.   Injection: 2 mg aflibercept  2 MG/0.05ML   Route: Intravitreal, Site: Left Eye   NDC: [id number], Lot: [id number], Expiration date: 09/02/2025, Waste: 0 mL   Post-op Post injection exam found visual acuity of at least counting fingers. The patient tolerated the procedure well. There were no complications. The patient received written and verbal post procedure care education. Post injection medications were not given.             ASSESSMENT/PLAN:    ICD-10-CM   1. Exudative age-related macular degeneration of left eye with active choroidal neovascularization (HCC)  H35.3221 OCT, Retina - OU - Both Eyes    Intravitreal Injection, Pharmacologic Agent - OS - Left Eye    aflibercept  (EYLEA ) SOLN 2 mg    2. Exudative age-related macular degeneration of right eye with active choroidal neovascularization (HCC)  H35.3211     3. Essential hypertension  I10     4. Hypertensive retinopathy of both eyes  H35.033     5. Pseudophakia of both eyes  Z96.1     6. Dry eyes  H04.123     7. Left posterior capsular opacification  H26.492      1. Exudative age-related macular degeneration, left eye  - OCT 4.30.2020 showed interval conversion of OS from nonexudative ARMD to exu-ARMD with large dome-shaped PED with overlying IRF/SRF  - FA 05.28.20 -- no active CNV OS, just staining - s/p IVA OS #1  (04.30.20), #2 (05.28.20), #3 (06.26.20), #4 (08.03.20), #5 (09.11.20), #6 (10.19.20), #7 (11.23.20) - reactivation of CNV noted on 05.05.22 -- s/p IVA OS  #8 (05.05.22), #9 (06.06.22), #10 (07.19.22), #11 (09.13.22), #12 (10.25.22), #13 (12.13.22), #14 (01.30.23), #15 (03.20.23), #16 (05.08.23), #17 (06.19.23), #18 (08.07.23), #19 (10.02.23), #20 (11.20.23), #21 (01.15.24), #22 (12.19.24), #23 (02.21.25) ================== - s/p IVE OS #1 (03.11.24), #2 (04.22.24), #3 (05.29.24), #4 (07.22.24), #5 (09.09.24), #6 (10.28.24), #7 (12.19.24), #8 (04.18.25), #9 (06.02.25), #10 (07.21.25), #11 (09.15.25) - **Interval increase in IRF overlying PED at 8 wks on 09.13.22 visit (IVA)**  - BCVA OS 20/30 - stable - OCT shows Stable improvement in IRF/cystic changes inferior fovea, stable improvement in [facility] / PED inferior to fovea at 9+ weeks - recommend IVE today OS #12 (11.19.25) with f/u ext to 12 weeks.  - pt wishes to proceed with injection  - RBA of procedure discussed, questions answered - see procedure note - IVE informed consent obtained and signed, 11.19.25 - benefits investigation for Eylea  initiated 4.30.2020 -- approved for 2025 but pt  covering 20% coinsurance on medications  - f/u in 12 weeks -- DFE/OCT, possible injection  2. Age related macular degeneration, exudative, OD - interval development of IRF first noted on 09.11.20 -- conversion from nonexudative to exudative ARMD - pt initially presented on 11.27.19 due to alert from [facility] home monitoring system for OD  - [facility] prescribed by Dr. Cleatus - FA (5.28.2020) showed staining / window defect corresponding temporal RPE changes OU -- no active CNV OU - S/P IVA OD #1 (09.11.20), #2 (10.19.20), #3 (11.23.20), #4 (01.07.21), #5 (2.19.21), #6 (04.02.21), #7 (05.28.21), #8 (07.23.21), #9 (09.24.21), #10 (11.29.21), #11 (02.11.22), #12 (05.05.22), #13 (7.19.22), #14 (09.13.22), #15 (10.02.23), #16 (11.20.23), #17 (01.15.24), #18 (03.11.24) -  OCT shows stable improvement in cystic changes nasal and temporal fovea, patchy ORA / GA -- no fluid  - BCVA OD 20/25 from 20/30 (20+ mos since last injxn)  - recommend holding IVA OD again today -- will treat PRN - IVA informed consent obtained and signed, 05.08.23 (OU) - see procedure note  - f/u in 12 weeks DFE, OCT  3,4. Hypertensive retinopathy OU  - discussed importance of tight BP control  - monitor  5. Pseudophakia OU  - s/p CE/IOL OU  - beautiful surgeries, doing well  - monitor  6. Dry eyes OU  - recommend artificial tears and lubricating ointment as needed  - using Miebo and Lumify  per Dr. Vivian  7. PCO OU (OS > OD)  - s/p Yag Cap OS 07.31.24 - BCVA OS 20/30 and pleased with the result  Ophthalmic Meds Ordered this visit:  Meds ordered this encounter  Medications   aflibercept  (EYLEA ) SOLN 2 mg     Return in about 12 weeks (around 09/13/2024) for f/u, Ex. AMD, DFE, OCT, Possible, IVE, OS.  There are no Patient Instructions on file for this visit.  This document serves as a record of services personally performed by Redell JUDITHANN Hans, MD, PhD. It was created on their behalf by Almetta Pesa, an ophthalmic technician. The creation of this record is the provider's dictation and/or activities during the visit.    Electronically signed by: Almetta Pesa, OA, 06/21/24  11:46 PM  This document serves as a record of services personally performed by Redell JUDITHANN Hans, MD, PhD. It was created on their behalf by Wanda GEANNIE Keens, COT an ophthalmic technician. The creation of this record is the provider's dictation and/or activities during the visit.    Electronically signed by:  Wanda GEANNIE Keens, COT  06/21/24 11:46 PM  Redell JUDITHANN Hans, M.D., Ph.D. Diseases & Surgery of the Retina and Vitreous [facility]  I have reviewed the above documentation for accuracy and completeness, and I agree with the above. Redell JUDITHANN Hans, M.D., Ph.D.  06/21/24 11:47 PM   Abbreviations: M myopia (nearsighted); A astigmatism; H hyperopia (farsighted); P presbyopia; Mrx spectacle prescription;  CTL contact lenses; OD right eye; OS left eye; OU both eyes  XT exotropia; ET esotropia; PEK punctate epithelial keratitis; PEE punctate epithelial erosions; DES dry eye syndrome; MGD meibomian gland dysfunction; ATs artificial tears; PFAT's preservative free artificial tears; NSC nuclear sclerotic cataract; PSC posterior subcapsular cataract; ERM epi-retinal membrane; PVD posterior vitreous detachment; RD retinal detachment; DM diabetes mellitus; DR diabetic retinopathy; NPDR non-proliferative diabetic retinopathy; PDR proliferative diabetic retinopathy; CSME clinically significant macular edema; DME diabetic macular edema; dbh dot blot hemorrhages; CWS cotton wool spot; POAG primary open angle glaucoma; C/D cup-to-disc ratio; HVF humphrey visual field; GVF goldmann  visual field; OCT optical coherence tomography; IOP intraocular pressure; BRVO Branch retinal vein occlusion; CRVO central retinal vein occlusion; CRAO central retinal artery occlusion; BRAO branch retinal artery occlusion; RT retinal tear; SB scleral buckle; PPV pars plana vitrectomy; VH Vitreous hemorrhage; PRP panretinal laser photocoagulation; IVK intravitreal kenalog ; VMT vitreomacular traction; MH Macular hole;  NVD neovascularization of the disc; NVE neovascularization elsewhere; AREDS age related eye disease study; ARMD age related macular degeneration; POAG primary open angle glaucoma; EBMD epithelial/anterior basement membrane dystrophy; ACIOL anterior chamber intraocular lens; IOL intraocular lens; PCIOL posterior chamber intraocular lens; Phaco/IOL phacoemulsification with intraocular lens placement; PRK photorefractive keratectomy; LASIK laser assisted in situ keratomileusis; HTN hypertension; DM diabetes mellitus; COPD chronic obstructive pulmonary disease

## 2024-06-14 ENCOUNTER — Ambulatory Visit: Admitting: Physical Therapy

## 2024-06-14 ENCOUNTER — Encounter: Payer: Self-pay | Admitting: Physical Therapy

## 2024-06-14 DIAGNOSIS — R293 Abnormal posture: Secondary | ICD-10-CM

## 2024-06-14 DIAGNOSIS — M546 Pain in thoracic spine: Secondary | ICD-10-CM | POA: Diagnosis not present

## 2024-06-14 NOTE — Therapy (Signed)
 OUTPATIENT PHYSICAL THERAPY THORACOLUMBAR NOTE  DISCHARGE      Patient Name: Kaitlyn Parks MRN: [id number] DOB:1935-01-12, 89 y.o., female Today's Date: 06/14/2024   PHYSICAL THERAPY DISCHARGE SUMMARY  Visits from Start of Care: 5  Current functional level related to goals / functional outcomes: See below may   Remaining deficits: Posture, min pain, spine stiffness into extension    Education / Equipment: HEP, bone health, posture and lifting    Patient agrees to discharge. Patient goals were met. Patient is being discharged due to being pleased with the current functional level. she saw me to call END OF SESSION:  PT End of Session - 06/14/24 1308     Visit Number 5    Number of Visits 8    Date for Recertification  07/12/24    Authorization Type [employer] [facility]    Authorization Time Period submitted 05/17/24    PT Start Time 1306    PT Stop Time 1345    PT Time Calculation (min) 39 min    Activity Tolerance Patient tolerated treatment well    Behavior During Therapy WFL for tasks assessed/performed              Past Medical History:  Diagnosis Date   Arthritis    DDD, scoliosis, sees Dr. Mavis for this, uses norco very rarely for pain   Arthritis    lumbar stenosis, gets steroid injections for [facility] Spine   Bradycardia 11/11/2017   CAD (coronary artery disease)    LAD stenting of a 90% lesion 2012   Cystocele    Educated about COVID-19 virus infection 12/06/2019   Elevated cholesterol    GERD (gastroesophageal reflux disease)    dx in work up 2016 for atypical CP at OSH   pt. denies   Heart murmur    Hypertensive retinopathy    OU   Hypothyroidism    Interstitial cystitis    sees Dr. Watt   Macular degeneration    OU   Neuromuscular disorder [facility])    neuropathy   NSTEMI (non-ST elevated myocardial infarction) (HCC)    Osteoporosis    Rectocele    S/P hip replacement, right 06/12/2017   Scoliosis    Thyroid  disease    Hypothyroid    Urinary incontinence    USI   Uterine prolapse    Past Surgical History:  Procedure Laterality Date   BLADDER SUSPENSION  2011   CATARACT EXTRACTION Bilateral 2015   Dr. Cleatus   CORONARY ANGIOPLASTY WITH STENT PLACEMENT  04/21/2010   LAD 80%, RCA 30%, nl EF, s/p DES LAD   EYE SURGERY     LEFT HEART CATH AND CORONARY ANGIOGRAPHY N/A 06/14/2017   Procedure: LEFT HEART CATH AND CORONARY ANGIOGRAPHY;  Surgeon: Court Dorn PARAS, MD;  Location: [facility] IN[facility]ASIVE [facility];  Service: Cardiovascular;  Laterality: N/A;   OOPHORECTOMY  2011   BSO   TOTAL HIP ARTHROPLASTY Right 06/10/2017   Procedure: RIGHT TOTAL HIP ARTHROPLASTY ANTERIOR APPROACH;  Surgeon: Fidel Rogue, MD;  Location: [facility] ORS;  Service: Orthopedics;  Laterality: Right;  Needs RNFA   TOTAL HIP ARTHROPLASTY Left 01/27/2024   Procedure: ARTHROPLASTY, HIP, TOTAL, ANTERIOR APPROACH;  Surgeon: Fidel Rogue, MD;  Location: [facility] OR[facility];  Service: Orthopedics;  Laterality: Left;   VAGINAL HYSTERECTOMY  2011   LAVH BSO; benign   Patient Active Problem List   Diagnosis Date Noted   Normocytic anemia 04/06/2024   Vomiting and diarrhea 02/08/2024   Primary osteoarthritis of left hip  01/27/2024   S/P total left hip arthroplasty 01/27/2024   Pancreatitis, acute 06/14/2022   Abdominal pain 06/13/2022   Acute pancreatitis 06/13/2022   Hyponatremia 06/13/2022   Gastritis 06/11/2022   Aortic atherosclerosis 12/07/2019   Macular degeneration 01/11/2019   Coronary artery disease involving native coronary artery of native heart without angina pectoris 10/29/2018   Presence of intraocular lens 09/23/2018   Bradycardia 11/11/2017   Dyslipidemia 11/11/2017   CAD (coronary artery disease) 06/12/2017   GERD (gastroesophageal reflux disease) 06/12/2017   S/P hip replacement, right 06/12/2017   Osteoarthritis of right hip 06/10/2017   CAD S/P percutaneous coronary angioplasty 02/11/2015   Renal insufficiency 02/11/2015   Prolonged Q-T interval on ECG  04/18/2010   Hypertension 08/11/2007   Hypothyroidism (acquired) 06/08/2007   MITRAL VALVE PROLAPSE 06/08/2007    PCP: Ozell Railing MD   REFERRING PROVIDER: Ozell Railing HERO, MD  REFERRING DIAG:  M54.9 (ICD-10-CM) - Back pain, unspecified back location, unspecified back pain laterality, unspecified chronicity    Rationale for Evaluation and Treatment: Rehabilitation  THERAPY DIAG:  Abnormal posture  Pain in thoracic spine  ONSET DATE: ACUTE ON CHRONIC   SUBJECTIVE:                                                                                                                                                                                           SUBJECTIVE STATEMENT: I feel ready, I have water  aerobics and I do both chair and mat yoga.  Have some mid back pain 2/10.     PERTINENT HISTORY:  Osteoporosis with bilateral hip replacement and scoliosis   PAIN:  Are you having pain? Yes: NPRS scale: none    Pain location: L mid back  Pain description: tight, tense Aggravating factors: sitting unsupported Relieving factors: stretching and heat  PRECAUTIONS: Other: osteoporosis, avoid flex/rot combo   RED FLAGS: None   WEIGHT BEARING RESTRICTIONS: No  FALLS:  Has patient fallen in last 6 months? No  LIVING ENVIRONMENT: Lives with: lives with their family daughter lives with her  Lives in: House/apartment Stairs: Yes: External: 12  steps; on right going up and L as well  Has following equipment at home: Single point cane and Walker - 2 wheeled  Has used a walker for her SIJ joint pain   OCCUPATION: retired, was in office environment, raised 2 girls  PLOF: Independent and Leisure: bridge, book clubs, yoga, church  Patient stated the plans are to move to friend's home in the not so distant future.[name]    PATIENT GOALS: Pain reduction  NEXT MD VISIT: As needed  OBJECTIVE:  Note: Objective measures were  completed at Evaluation unless otherwise  noted.  DIAGNOSTIC FINDINGS:  FINDINGS: AP, lateral, and swimmer's lateral three views thoracic spine. Again noted is a mild thoracic kypholevoscoliosis. No AP listhesis is seen.   There is osteopenia, thoracic spondylosis and multilevel degenerative discs. No acute thoracic spine fracture is evident.   There is dextrorotary lumbar scoliosis apex L2-3, degenerative change lumbar spine.   There is calcification of the transverse aorta. There are multilevel costochondral calcifications.   IMPRESSION: 1. Osteopenia, degenerative change and kyphoscoliosis. No evidence of acute thoracic spine fracture. 2. Aortic atherosclerosis.  PATIENT SURVEYS:  Modified Oswestry:  MODIFIED OSWESTRY DISABILITY SCALE  Date: 05/17/2024 Score  Pain intensity 3 =  Pain medication provides me with moderate relief from pain.  2. Personal care (washing, dressing, etc.) 0 =  I can take care of myself normally without causing increased pain.  3. Lifting 4 = I can lift only very light weights  4. Walking 3 =  Pain prevents me from walking more than  mile.  5. Sitting 3 =  Pain prevents me from sitting more than  hour.  6. Standing 3 =  Pain prevents me from standing more than 1/2 hour.  7. Sleeping 1 = I can sleep well only by using pain medication.  8. Social Life 1 =  My social life is normal, but it increases my level of pain.  9. Traveling 1 =  I can travel anywhere, but it increases my pain.  10. Employment/ Homemaking 2 = I can perform most of my homemaking/job duties, but pain prevents me from performing more physically stressful activities (eg, lifting, vacuuming).  Total 21/50   Interpretation of scores: Score Category Description  0-20% Minimal Disability The patient can cope with most living activities. Usually no treatment is indicated apart from advice on lifting, sitting and exercise  21-40% Moderate Disability The patient experiences more pain and difficulty with sitting, lifting and  standing. Travel and social life are more difficult and they may be disabled from work. Personal care, sexual activity and sleeping are not grossly affected, and the patient can usually be managed by conservative means  41-60% Severe Disability Pain remains the main problem in this group, but activities of daily living are affected. These patients require a detailed investigation  61-80% Crippled Back pain impinges on all aspects of the patient's life. Positive intervention is required  81-100% Bed-bound These patients are either bed-bound or exaggerating their symptoms  Bluford FORBES Zoe DELENA Karon DELENA, et al. Surgery versus conservative management of stable thoracolumbar fracture: the PRESTO feasibility RCT. [place] ([place]): [employer]; 2021 Nov. [facility] Technology Assessment, No. 25.62.) Appendix 3, Oswestry Disability Index category descriptors. Available from: Findjewelers.cz  Minimally Clinically Important Difference (MCID) = 12.8%  COGNITION: Overall cognitive status: Within functional limits for tasks assessed     SENSATION: WFL  MUSCLE LENGTH: Not tested in lower extremity   POSTURE: rounded shoulders, forward head, and increased thoracic kyphosis  PALPATION: Painful left medial scapular border along the rhomboids and middle, lower trapezius.  No pain with deep inhalation.  Increased tension and moderate-sized knots throughout these muscles.  Decreased tolerance to palpation  LUMBAR ROM:   AROM eval  Flexion NT due to OP   Extension 80%   Right lateral flexion 50% stretch  Left lateral flexion 50% stretch  Right rotation 25% min pain L   Left rotation 25% min pain L    (Blank rows = not tested)  LOWER EXTREMITY ROM:  Not tested Upper extremity range of motion: Limited left shoulder flexion abduction and external rotation due to chronic injury  Upper extremity strength grossly 4 out of 5 bilaterally LOWER EXTREMITY MMT:    MMT  Right eval Left eval  Hip flexion 5 5  Hip extension    Hip abduction    Hip adduction    Hip internal rotation    Hip external rotation    Knee flexion 5 5  Knee extension 5 5  Ankle dorsiflexion    Ankle plantarflexion    Ankle inversion    Ankle eversion     (Blank rows = not tested)  LUMBAR SPECIAL TESTS:  NT   FUNCTIONAL TESTS:  5 times sit to stand: 13.4 sec  2 minute walk test: Not tested    GAIT: Distance walked: 150 Assistive device utilized: None Level of assistance: Modified independence Comments: No significant gait deviations other than decreased velocity decreased arm swing small step length  TREATMENT DATE:    [facility] Adult PT Treatment:                                                DATE: 06/14/24 Therapeutic Activity: Standing dowel AAROM flexion , red band for 2nd set  Horizontal pull red band  Standing extension with foam roller facing wall  Wall push ups  Standing open book each side  Standing row and extension x 15 yellow band at Pilates tower Upper trunk rotation with L arm Reach  Supine bridge  LTR with head turns  Self Care: Goals, progress and improvement made     [facility] Adult PT Treatment:                                                DATE: 06/07/24 Therapeutic Exercise/Activity: Row x 15 GTB Extension x 15 GTB Single arm diagonal pull x 10 each arm Lt UE Yellow  Horizontal pull Yellow band  Scaption 2 lb x 10 ER 2 lbs  x 10 Cat and camel Thread the needle x  3 each side  Quadruped extension single arm and then triceps 2 lbs x  10  Sidelying open book  Rt sided stretch for elongation of Rt trunk 1 min   [facility] Adult PT Treatment:                                                DATE: 05/31/24 Therapeutic Activity/Exercise: Supine horizontal abduction and flexion yellow band  ER unattached yellow Standing row, extension blue then red, respectively  Triceps ext bands GTB Standing dowel AAROM flexion  Lower trunk rotation x 5 Corner  stretch 3 x  Wall push ups 2 x 10 (triceps) Standing scapular protraction Rhomboid stretch  Standing balance tandem, narrow and head turns Sit to stand x 10  added a step for min dynamic movement    [facility] Adult PT Treatment:  DATE: 05/24/24 Therapeutic Exercise: Horizontal pulls red band x 2 x 10 (mod to yellow)  Standing horizontal abduction yellow band  Standing row, extension  Therapeutic Activity: Supine dowel OH lift LTR with head turns  Goal post open/close ER looped band x 15 added chest press  Narrow band (looped) yellow OH lift Postural corrections, technique  [facility] Adult PT Treatment:                                                DATE: 05/17/2024 Patient instructed in osteoporosis precautions, posture trigger points and home exercise program HEP : trial reps  Open book left side x 5 Goal post with longitudinal towel to decompress spine Rhomboid stretch cross body, reports pain with this modified to bilateral scapular protraction hands clasped and cervical flexion Corner stretch x 30 seconds                                                                                                                     PATIENT EDUCATION:  Education details: See above Person educated: Patient Education method: Explanation, Demonstration, Tactile cues, Verbal cues, and Handouts Education comprehension: needs further education  HOME EXERCISE PROGRAM: Access Code: [email or web address] URL: [email or web address] Date: 06/14/2024 Prepared by: Delon Norma  Exercises - Open Book Chest Stretch on Towel Roll  - 1 x daily - 7 x weekly - 1-2 min  hold - Sidelying Thoracic Rotation with Open Book  - 1 x daily - 7 x weekly - 1 sets - 5-10 reps - 10 hold - Corner Pec Major Stretch  - 1 x daily - 7 x weekly - 1 sets - 3 reps - 30 hold - Seated Scapular Retraction  - 1 x daily - 7 x weekly - 2 sets - 10 reps - 5 hold - Scapular Protraction  -  1 x daily - 7 x weekly - 2 sets - 10 reps - 5 hold - Standing Shoulder Row with Anchored Resistance  - 1 x daily - 7 x weekly - 2 sets - 10 reps - 5 hold - Shoulder extension with resistance - Neutral  - 1 x daily - 7 x weekly - 2 sets - 10 reps - 5 hold - Standing Shoulder Horizontal Abduction with Resistance  - 1-2 x daily - 7 x weekly - 2 sets - 10 reps - 5 hold - Wall Push Up with Plus  - 1 x daily - 7 x weekly - 2 sets - 10 reps - Supine Bridge  - 1 x daily - 7 x weekly - 2 sets - 10 reps - 5 hold   ASSESSMENT:  CLINICAL IMPRESSION: Patient has essentially met all of her goals.  She continues to have minimal intermittent pain in the left mid back related to her scoliosis likely and it interferes at times with reaching out to the side and up  overhead.  Yvaine is very active and has a full home program now from therapy as well as aquatic therapy and yoga.  She knows a little bit more now about the importance of weightbearing exercises and strength training for bone density.  She was pleased with her current status and is agreeable to being discharged from physical therapy   Patient is a 89 y.o. female who was seen today for physical therapy evaluation and treatment for left-sided thoracic pain felt to be musculoskeletal in nature as a response to degenerative condition related to scoliosis.  OBJECTIVE IMPAIRMENTS: decreased activity tolerance, decreased balance, decreased coordination, decreased endurance, decreased knowledge of condition, decreased knowledge of use of DME, decreased mobility, decreased ROM, decreased strength, increased fascial restrictions, impaired flexibility, impaired UE functional use, improper body mechanics, postural dysfunction, and pain.   ACTIVITY LIMITATIONS: carrying, lifting, sitting, standing, sleeping, and locomotion level  PARTICIPATION LIMITATIONS: meal prep, cleaning, interpersonal relationship, community activity, and church  PERSONAL FACTORS: Age, Time  since onset of injury/illness/exacerbation, and 3+ comorbidities: Osteoporosis prescription scoliosis and recent hip replacement 01/2024 [place]  are also affecting patient's functional outcome.   REHAB POTENTIAL: Excellent  CLINICAL DECISION MAKING: Stable/uncomplicated  EVALUATION COMPLEXITY: Low   GOALS: Goals reviewed with patient? Yes  SHORT TERM GOALS: Target date: 06/14/2024    Patient will be able to show independence for initial HEP to include posture, core and hip strength and stability.   Baseline: Goal status: MET   2.  Patient with demo proper hip hinge to preserve spine  Baseline:  Goal status: ongoing   3.  Patient will be able to sit unsupported with minimal increase in pain Baseline:  Goal status: MET   3.  Patient will be screened for balance issues and goals set if appropriate  Baseline:  Goal status: MET   LONG TERM GOALS: Target date: 07/12/2024    Pt will be able to report no increased pain with basic personal care ADLs Baseline:  Goal status: partially met, L shoulder at time with reaching overhead   2.  Patient will be independent with home exercise program upon discharge for posture and strength Baseline:  Goal status: MET   3.  Patient will be able to sit unsupported for 30 minutes without increased pain Baseline:  Goal status: MET , uses pillow  4.  Patient will be able to report improved sleep with less reliance on medication for both her hip and her middle back Baseline:  Goal status: MET, min shoulder discomfort   5.  Pt will be able to demonstrate good standing balance as evidenced by balance recovery from min to mod dynamic balance challenges.   Baseline:  Goal status: MET   6.  Patient will improve modified ODI improved by 12% as a proxy for improved functional mobility Baseline: 21/50, 11/50 (22%) Goal status: MET   PLAN:  PT FREQUENCY: 1x/week  PT DURATION: 8 weeks  PLANNED INTERVENTIONS: 97164- PT Re-evaluation,  97750- Physical Performance Testing, 97110-Therapeutic exercises, 97530- Therapeutic activity, [id number]- Neuromuscular re-education, 97535- Self Care, [id number]- Manual therapy, [id number]- Gait training, Patient/Family education, Balance training, Taping, Cryotherapy, and Moist heat.  PLAN FOR NEXT SESSION:  NA, DC   Dayzee Trower, PT 06/14/2024, 1:09 PM

## 2024-06-21 ENCOUNTER — Encounter (INDEPENDENT_AMBULATORY_CARE_PROVIDER_SITE_OTHER): Payer: Self-pay | Admitting: Ophthalmology

## 2024-06-21 ENCOUNTER — Ambulatory Visit (INDEPENDENT_AMBULATORY_CARE_PROVIDER_SITE_OTHER): Admitting: Ophthalmology

## 2024-06-21 DIAGNOSIS — I10 Essential (primary) hypertension: Secondary | ICD-10-CM | POA: Diagnosis not present

## 2024-06-21 DIAGNOSIS — H35033 Hypertensive retinopathy, bilateral: Secondary | ICD-10-CM

## 2024-06-21 DIAGNOSIS — H353221 Exudative age-related macular degeneration, left eye, with active choroidal neovascularization: Secondary | ICD-10-CM

## 2024-06-21 DIAGNOSIS — Z961 Presence of intraocular lens: Secondary | ICD-10-CM

## 2024-06-21 DIAGNOSIS — H353231 Exudative age-related macular degeneration, bilateral, with active choroidal neovascularization: Secondary | ICD-10-CM | POA: Diagnosis not present

## 2024-06-21 DIAGNOSIS — H353211 Exudative age-related macular degeneration, right eye, with active choroidal neovascularization: Secondary | ICD-10-CM

## 2024-06-21 DIAGNOSIS — H26492 Other secondary cataract, left eye: Secondary | ICD-10-CM

## 2024-06-21 DIAGNOSIS — H04123 Dry eye syndrome of bilateral lacrimal glands: Secondary | ICD-10-CM

## 2024-06-21 MED ORDER — AFLIBERCEPT 2MG/0.05ML IZ SOLN FOR KALEIDOSCOPE
2.0000 mg | INTRAVITREAL | Status: AC | PRN
  Administered 2024-06-21: 2 mg via INTRAVITREAL

## 2024-06-23 ENCOUNTER — Ambulatory Visit

## 2024-06-23 VITALS — BP 156/62 | HR 71 | Resp 16 | Ht 63.0 in | Wt 128.8 lb

## 2024-06-23 DIAGNOSIS — M810 Age-related osteoporosis without current pathological fracture: Secondary | ICD-10-CM

## 2024-06-23 MED ORDER — DENOSUMAB 60 MG/ML ~~LOC~~ SOSY: 60.0000 mg | PREFILLED_SYRINGE | Freq: Once | SUBCUTANEOUS | Status: DC

## 2024-06-23 MED ORDER — DENOSUMAB 60 MG/ML ~~LOC~~ SOSY
60.0000 mg | PREFILLED_SYRINGE | Freq: Once | SUBCUTANEOUS | Status: AC
  Administered 2024-06-23: 60 mg via SUBCUTANEOUS

## 2024-06-23 MED ORDER — DENOSUMAB 60 MG/ML ~~LOC~~ SOSY: 60.0000 mg | PREFILLED_SYRINGE | Freq: Once | SUBCUTANEOUS | Status: AC

## 2024-06-23 NOTE — Progress Notes (Signed)
 After obtaining consent, and per orders of Dr. Erroll Luna, injection of Prolia given by Tera Partridge. Patient instructed to remain in clinic for 20 minutes afterwards, and to report any adverse reaction to me immediately.

## 2024-06-26 ENCOUNTER — Encounter (INDEPENDENT_AMBULATORY_CARE_PROVIDER_SITE_OTHER): Admitting: Ophthalmology

## 2024-06-28 ENCOUNTER — Encounter (HOSPITAL_COMMUNITY): Payer: Self-pay | Admitting: Internal Medicine

## 2024-06-28 ENCOUNTER — Other Ambulatory Visit: Payer: Self-pay

## 2024-06-28 ENCOUNTER — Emergency Department (HOSPITAL_COMMUNITY)

## 2024-06-28 ENCOUNTER — Observation Stay (HOSPITAL_COMMUNITY)
Admission: EM | Admit: 2024-06-28 | Discharge: 2024-06-29 | Disposition: A | Discharge status: Home / Self Care | Attending: Internal Medicine | Admitting: Internal Medicine

## 2024-06-28 ENCOUNTER — Observation Stay (HOSPITAL_COMMUNITY)

## 2024-06-28 ENCOUNTER — Telehealth: Payer: Self-pay | Admitting: Cardiology

## 2024-06-28 DIAGNOSIS — I10 Essential (primary) hypertension: Secondary | ICD-10-CM | POA: Insufficient documentation

## 2024-06-28 DIAGNOSIS — Z79899 Other long term (current) drug therapy: Secondary | ICD-10-CM | POA: Insufficient documentation

## 2024-06-28 DIAGNOSIS — Z7982 Long term (current) use of aspirin: Secondary | ICD-10-CM | POA: Diagnosis not present

## 2024-06-28 DIAGNOSIS — R600 Localized edema: Secondary | ICD-10-CM | POA: Insufficient documentation

## 2024-06-28 DIAGNOSIS — Z96642 Presence of left artificial hip joint: Secondary | ICD-10-CM | POA: Diagnosis not present

## 2024-06-28 DIAGNOSIS — E039 Hypothyroidism, unspecified: Secondary | ICD-10-CM | POA: Insufficient documentation

## 2024-06-28 DIAGNOSIS — Z96641 Presence of right artificial hip joint: Secondary | ICD-10-CM | POA: Diagnosis not present

## 2024-06-28 DIAGNOSIS — M810 Age-related osteoporosis without current pathological fracture: Secondary | ICD-10-CM | POA: Insufficient documentation

## 2024-06-28 DIAGNOSIS — R202 Paresthesia of skin: Secondary | ICD-10-CM | POA: Diagnosis present

## 2024-06-28 DIAGNOSIS — G459 Transient cerebral ischemic attack, unspecified: Secondary | ICD-10-CM | POA: Diagnosis not present

## 2024-06-28 DIAGNOSIS — R2990 Unspecified symptoms and signs involving the nervous system: Principal | ICD-10-CM | POA: Insufficient documentation

## 2024-06-28 DIAGNOSIS — I2510 Atherosclerotic heart disease of native coronary artery without angina pectoris: Secondary | ICD-10-CM | POA: Insufficient documentation

## 2024-06-28 LAB — BASIC METABOLIC PANEL WITH GFR
Anion gap: 7 (ref 5–15)
BUN: 21 mg/dL (ref 8–23)
CO2: 25 mmol/L (ref 22–32)
Calcium: 8.3 mg/dL — ABNORMAL LOW (ref 8.9–10.3)
Chloride: 108 mmol/L (ref 98–111)
Creatinine, Ser: 0.67 mg/dL (ref 0.44–1.00)
GFR, Estimated: >60 mL/min (ref >60)
Glucose, Bld: 120 mg/dL — ABNORMAL HIGH (ref 70–99)
Potassium: 4.3 mmol/L (ref 3.5–5.1)
Sodium: 140 mmol/L (ref 135–145)

## 2024-06-28 LAB — TROPONIN T, HIGH SENSITIVITY
Troponin T High Sensitivity: <15 ng/L (ref 0–19)
Troponin T High Sensitivity: <15 ng/L (ref 0–19)

## 2024-06-28 LAB — CBC
HCT: 40.9 % (ref 36.0–46.0)
Hemoglobin: 12.8 g/dL (ref 12.0–15.0)
MCH: 29.7 pg (ref 26.0–34.0)
MCHC: 31.3 g/dL (ref 30.0–36.0)
MCV: 94.9 fL (ref 80.0–100.0)
Platelets: 266 K/uL (ref 150–400)
RBC: 4.31 MIL/uL (ref 3.87–5.11)
RDW: 13.3 % (ref 11.5–15.5)
WBC: 4.8 K/uL (ref 4.0–10.5)
nRBC: 0 % (ref 0.0–0.2)

## 2024-06-28 LAB — HEPATIC FUNCTION PANEL
ALT: 13 U/L (ref 0–44)
AST: 21 U/L (ref 15–41)
Albumin: 4 g/dL (ref 3.5–5.0)
Alkaline Phosphatase: 115 U/L (ref 38–126)
Bilirubin, Direct: 0.1 mg/dL (ref 0.0–0.2)
Indirect Bilirubin: 0.1 mg/dL — ABNORMAL LOW (ref 0.3–0.9)
Total Bilirubin: 0.3 mg/dL (ref 0.0–1.2)
Total Protein: 6.4 g/dL — ABNORMAL LOW (ref 6.5–8.1)

## 2024-06-28 LAB — PRO BRAIN NATRIURETIC PEPTIDE: Pro Brain Natriuretic Peptide: 464 pg/mL — ABNORMAL HIGH (ref <300.0)

## 2024-06-28 MED ORDER — ASPIRIN 300 MG RE SUPP: 300.0000 mg | Freq: Every day | RECTAL | Status: DC

## 2024-06-28 MED ORDER — ASPIRIN 81 MG PO TBEC
81.0000 mg | DELAYED_RELEASE_TABLET | Freq: Every day | ORAL | Status: DC
  Administered 2024-06-29: 81 mg via ORAL
  Filled 2024-06-28: qty 1

## 2024-06-28 MED ORDER — ROSUVASTATIN CALCIUM 5 MG PO TABS
5.0000 mg | ORAL_TABLET | Freq: Every evening | ORAL | Status: DC
  Administered 2024-06-28: 5 mg via ORAL
  Filled 2024-06-28: qty 1

## 2024-06-28 MED ORDER — BRIMONIDINE TARTRATE 0.2 % OP SOLN: 1.0000 [drp] | OPHTHALMIC | Status: DC | PRN

## 2024-06-28 MED ORDER — ISOSORBIDE MONONITRATE ER 60 MG PO TB24
90.0000 mg | ORAL_TABLET | Freq: Every day | ORAL | Status: DC
  Administered 2024-06-28: 90 mg via ORAL
  Filled 2024-06-28: qty 1

## 2024-06-28 MED ORDER — LEVOTHYROXINE SODIUM 50 MCG PO TABS
50.0000 ug | ORAL_TABLET | Freq: Every day | ORAL | Status: DC
  Administered 2024-06-29: 50 ug via ORAL
  Filled 2024-06-28: qty 1

## 2024-06-28 MED ORDER — AMLODIPINE BESYLATE 5 MG PO TABS: 2.5000 mg | ORAL_TABLET | Freq: Every day | ORAL | Status: DC

## 2024-06-28 MED ORDER — IOHEXOL 350 MG/ML SOLN
75.0000 mL | Freq: Once | INTRAVENOUS | Status: AC | PRN
  Administered 2024-06-28: 75 mL via INTRAVENOUS

## 2024-06-28 MED ORDER — ENOXAPARIN SODIUM 40 MG/0.4ML IJ SOSY
40.0000 mg | PREFILLED_SYRINGE | INTRAMUSCULAR | Status: DC
  Administered 2024-06-28: 40 mg via SUBCUTANEOUS
  Filled 2024-06-28: qty 0.4

## 2024-06-28 MED ORDER — STROKE: EARLY STAGES OF RECOVERY BOOK
Freq: Once | Status: AC
  Filled 2024-06-28: qty 1

## 2024-06-28 MED ORDER — LIDOCAINE-MENTHOL 3.5-7 % EX PTCH: 1.0000 | MEDICATED_PATCH | Freq: Two times a day (BID) | CUTANEOUS | Status: DC | PRN

## 2024-06-28 MED ORDER — ASPIRIN 325 MG PO TABS
325.0000 mg | ORAL_TABLET | Freq: Every day | ORAL | Status: DC
  Administered 2024-06-28: 325 mg via ORAL
  Filled 2024-06-28: qty 1

## 2024-06-28 NOTE — ED Triage Notes (Signed)
 Pt ambulatory to triage with complaints of intense RIGHT arm pain that woke her up at 0330AM this morning. PT denies chest pain, denies shortness of breath. PT noted to have an irregular heart rate during triage.   LAD stent in 2012  Pt reports noting new ankle swelling.Pt takes amlodipine  and hydralazine .

## 2024-06-28 NOTE — ED Provider Notes (Signed)
 [place] EMERGENCY DEPARTMENT AT [facility] Provider Note   CSN: [id number] Arrival date & time: 06/28/24  9075     Patient presents with: Arm Pain   Kaitlyn Parks is a 89 y.o. female.   This is an 89 year old female presenting emergency department with arm paresthesia and weakness.  Awoke at 330 this morning with symptoms.  Stated it felt like her arm was a sleep, but she did not sleep on it wrong.  She had weakness to the arm to the hand, reports she could not even make fist.  Took her blood pressure and was elevated.  Took some of her home blood pressure medications and went back to sleep.  She woke up 730 and symptoms had greatly improved and have nearly resolved on my evaluation.  She reports minor frontal headache.  Does not typically get headaches.  No vision loss, no reported facial droop, dysarthria, aphasia, balance issues.  Denies chest pain or shortness of breath.  She is not having any neck pain.  No palpitations.  She is also complaining of some bilateral lower extremity edema that has been present for the past couple weeks.  No history of heart failure.     Arm Pain       Prior to Admission medications   Medication Sig Start Date End Date Taking? Authorizing Provider  Aflibercept  (EYLEA  IZ) 1 Dose by Intravitreal route See admin instructions. Every 7 weeks    [provider]  amLODipine  (NORVASC ) 2.5 MG tablet Take 1 tablet (2.5 mg total) by mouth daily. 02/17/24   [name] [name], MD  Biotin w/ Vitamins C & E (HAIR/SKIN/NAILS PO) Take 1 tablet by mouth daily with lunch.    [provider]  Brimonidine  Tartrate (LUMIFY ) 0.025 % SOLN Place 1 drop into both eyes every 4 (four) hours as needed (irritated eyes).    [provider]  Cranberry 400 MG CAPS Take 400 mg by mouth in the morning.    [provider]  hydrALAZINE  (APRESOLINE ) 10 MG tablet TAKE (1) TABLET TWICE A DAY. MAY TAKE EXTRA DOSE FOR SBP >160 UP TO A TOTAL  OF 4 TIMES A DAY AS NEEDED 09/09/23   Lavona Agent, MD  isosorbide  mononitrate (IMDUR ) 60 MG 24 hr tablet TAKE 1 & 1/2 TABLETS by mouth once A DAY. 05/08/24   [name] [name], MD  Lidocaine -Menthol  3.5-7 % PTCH Place 1 patch onto the skin 2 (two) times daily as needed (knee/hip pain.).    [provider]  Multiple Vitamins-Calcium  (ONE-A-DAY WOMENS PO) Take 1 tablet by mouth daily with lunch.    [provider]  nitroGLYCERIN  (NITROSTAT ) 0.4 MG SL tablet DISSOLVE 1 TABLET UNDER TONGUE IF NEEDED FOR CHEST PAIN. MAY REPEAT IN 5 MINUTES FOR 3 DOSES. 04/24/24   Lavona Agent, MD  NON FORMULARY Get steroid inject for SI joint every 12 wks with Dr. Darlis Leash surgery spine    [provider]  pantoprazole  (PROTONIX ) 40 MG tablet Take 1 tablet (40 mg total) by mouth daily. 03/03/24   Abran Norleen SAILOR, MD  rosuvastatin  (CRESTOR ) 10 MG tablet TABLET ONE-HALF TABLET BY MOUTH DAILY 10/22/23   Lavona Agent, MD  SYNTHROID  50 MCG tablet TAKE 1 TABLET EACH DAY. NEED APPOINTMENT FOR ADDITIONAL REFILLS 04/24/24   [name] [name], MD  tiZANidine  (ZANAFLEX ) 4 MG tablet Take 1 tablet (4 mg total) by mouth every 6 (six) hours as needed for muscle spasms. 04/26/24   [name] [name], MD  Allergies: Acyclovir and related, Ciprofloxacin , Pravachol  [pravastatin  sodium], Sulfa  antibiotics, and Zocor  [simvastatin ]    Review of Systems  Updated Vital Signs BP (!) 175/81 (BP Location: Right Arm)   Pulse 61   Temp 98 F (36.7 C) (Oral)   Resp 16   Ht 5' 3 (1.6 m)   Wt 58.5 kg   SpO2 99%   BMI 22.85 kg/m   Physical Exam Vitals and nursing note reviewed.  Constitutional:      General: She is not in acute distress. HENT:     Nose: Nose normal.     Mouth/Throat:     Mouth: Mucous membranes are moist.  Eyes:     Conjunctiva/sclera: Conjunctivae normal.  Cardiovascular:     Rate and Rhythm: Normal rate and regular rhythm.  Pulmonary:     Effort: Pulmonary effort is  normal.     Breath sounds: Normal breath sounds.  Abdominal:     General: Abdomen is flat. There is no distension.     Palpations: Abdomen is soft.     Tenderness: There is no abdominal tenderness. There is no guarding or rebound.  Musculoskeletal:        General: Normal range of motion.  Skin:    General: Skin is warm.     Capillary Refill: Capillary refill takes less than 2 seconds.  Neurological:     General: No focal deficit present.     Mental Status: She is alert and oriented to person, place, and time.  Psychiatric:        Mood and Affect: Mood normal.        Behavior: Behavior normal.     (all labs ordered are listed, but only abnormal results are displayed) Labs Reviewed  BASIC METABOLIC PANEL WITH GFR - Abnormal; Notable for the following components:      Result Value   Glucose, Bld 120 (*)    Calcium  8.3 (*)    All other components within normal limits  PRO BRAIN NATRIURETIC PEPTIDE - Abnormal; Notable for the following components:   Pro Brain Natriuretic Peptide 464.0 (*)    All other components within normal limits  HEPATIC FUNCTION PANEL - Abnormal; Notable for the following components:   Total Protein 6.4 (*)    Indirect Bilirubin 0.1 (*)    All other components within normal limits  CBC  TROPONIN T, HIGH SENSITIVITY  TROPONIN T, HIGH SENSITIVITY    EKG: EKG Interpretation Date/Time:  Wednesday June 28 2024 09:44:54 EST Ventricular Rate:  52 PR Interval:  189 QRS Duration:  97 QT Interval:  482 QTC Calculation: 449 R Axis:   80  Text Interpretation: Sinus rhythm Nonspecific T abnrm, anterolateral leads Confirmed by Neysa Clap [phone]) on 06/28/2024 10:09:53 AM  Radiology: MR BRAIN WO CONTRAST Result Date: 06/28/2024 EXAM: MRI BRAIN WITHOUT CONTRAST 06/28/2024 02:01:19 PM TECHNIQUE: Multiplanar multisequence MRI of the head/brain was performed without the administration of intravenous contrast. COMPARISON: Head MRI 02/14/2023 CLINICAL HISTORY:  Stroke, follow up. Right arm pain. FINDINGS: BRAIN AND VENTRICLES: There is no evidence of an acute infarct, intracranial hemorrhage, mass, midline shift, hydrocephalus, or extra-axial fluid collection. Mild cerebral atrophy is within normal limits for age. No significant white matter disease is seen for age. Major intracranial vascular flow voids are preserved. ORBITS: Bilateral cataract extraction. SINUSES AND MASTOIDS: No acute abnormality. BONES AND SOFT TISSUES: Normal marrow signal. No acute soft tissue abnormality. IMPRESSION: 1. Unremarkable appearance of the brain for age. Electronically signed by: Dasie Hamburg MD 06/28/2024  02:44 PM EST RP Workstation: [id number]   DG Chest 2 View Result Date: 06/28/2024 EXAM: 2 VIEW(S) XRAY OF THE CHEST 06/28/2024 10:11:00 AM COMPARISON: Chest radiograph 04/23/2022. CLINICAL HISTORY: cardiac sx FINDINGS: LUNGS AND PLEURA: No focal pulmonary opacity. No pleural effusion. No pneumothorax. HEART AND MEDIASTINUM: Aortic arch calcifications. No acute abnormality of the cardiac and mediastinal silhouettes. BONES AND SOFT TISSUES: Thoracic spondylosis. IMPRESSION: 1. No acute cardiopulmonary process. Electronically signed by: Donnice Mania MD 06/28/2024 10:41 AM EST RP Workstation: [id number]     Procedures   Medications Ordered in the ED - No data to display  Clinical Course as of 06/28/24 1543  Wed Jun 28, 2024  1452 MR BRAIN WO CONTRAST IMPRESSION: 1. Unremarkable appearance of the brain for age.   [TY]  1524 Contacted neurology regarding TIA but is in a another code stroke; Will call back. [TY]    Clinical Course User Index [TY] Neysa Caron PARAS, DO                                 Medical Decision Making This is an 89 year old female past medical history includes CAD, hypothyroid, hypertension, arthritis, GERD presenting the emergency department with arm paresthesia/weakness.  Symptoms have seemed to have completely resolved currently.  Hypertensive  here, but hemodynamically stable.  Nontachycardic.  There is note mentions irregular heart rate, but appears to be normal sinus rhythm on my evaluation and on EKG.  Lower suspicion for ACS given reassuring EKG without ischemic changes on my independent review.  Troponins negative.  No leukocytosis to suggest infectious process.  No metabolic derangements.  No transaminitis to suggest hepatobiliary disease.  Concern for possible stroke versus TIA.  MRI ordered to further evaluate.  In terms of her lower extremity edema does have mildly elevated BNP 464, but chest x-ray without pulmonary edema.  Does not have significant appear cardiomegaly either.  Albumin is normal.  No CKD or kidney injury.  MRI negative for acute stroke.  Patient's history concerning for possible TIA.  Does have risk factors.  Neurology consulted.  Also with elevated BNP lower extremity edema would benefit from further evaluation.  Will admit for TIA workup.   Amount and/or Complexity of Data Reviewed Independent Historian:     Details: Daughter notes patient at baseline External Data Reviewed:     Details: CTA last year showed : MPRESSION: 1. No acute intracranial process. 2. No intracranial large vessel occlusion or significant stenosis. 3. No hemodynamically significant stenosis in the neck. 4. Beading of the mid to distal bilateral internal carotid arteries, concerning for fibromuscular dysplasia.  Labs: ordered. Decision-making details documented in ED Course. Radiology: ordered and independent interpretation performed. Decision-making details documented in ED Course.    Details: Do not appreciate obvious intracranial hemorrhage ECG/medicine tests: independent interpretation performed. Decision-making details documented in ED Course.  Risk Decision regarding hospitalization. Diagnosis or treatment significantly limited by social determinants of health. Risk Details: Lives home alone       Final diagnoses:   Stroke-like symptoms  Lower extremity edema    ED Discharge Orders     None          Neysa Caron PARAS, DO 06/28/24 1543

## 2024-06-28 NOTE — Telephone Encounter (Signed)
 Received incoming STAT call from pt. She states she woke up violently around 3:30 am today with a pain in her Right arm, Right hand numbness, a pain across her upper Back and a slight headache. She had not been sleeping on her right side as she has not been able to sleep on that side due to a Hip replacement in June.   She is also c/o gradual swelling in her feet/ankles that has increased over the last 2 weeks. She weighs about 124 lbs. She is concerned about CHF.   Also had a Chest Pain at same time, 3:30 am today and she took 3 SL NTG. BP at 4:00 am was 181/96; HR=66. AT 8:20 am, BP was 177/81. Her normal is around 120/80 and HR normal usually 51.   She had a stent in her LAD in 2012.   Advised pt that she should present to an Emergency Department, and then call us  back to f/u with Cardiology. Pt verbalized understanding.

## 2024-06-28 NOTE — Telephone Encounter (Signed)
 Pt c/o of Chest Pain: STAT if active (IN THIS MOMENT) CP, including tightness, pressure, jaw pain, shoulder/upper arm/back pain, SOB, nausea, and vomiting.  1. Are you having CP right now (tightness, pressure, or discomfort)?  Still having arm and back  pain   2. Are you experiencing any other symptoms (ex. SOB, nausea, vomiting, sweating)? No   3. How long have you been experiencing CP? Started 3:30am this morning, she emphasized that she was not sleeping on it. States she was sleep on her back at the time   4. Is your CP continuous or coming and going? Continuous   5. Have you taken Nitroglycerin ? Yes, she took 3 starting at 3:30am a few minutes apart   6. If CP returns before callback, please consider calling 911.     Pt c/o BP issue: STAT if pt c/o blurred vision, one-sided weakness or slurred speech.  STAT if BP is GREATER than 180/120 TODAY.  STAT if BP is LESS than 90/60 and SYMPTOMATIC TODAY  1. What is your BP concern? Pt concerned her bp has been high   2. Have you taken any BP medication today? Has taken 4-5 hydralazines since 9pm last night   3. What are your last 5 BP readings?  11/26: 174/87 (currently)  11/26: 177/81 (5:30am, took another hydralazine )  11/26: 181/96 (4:00am)  11/25: 157/82 (9:00pm, after taking hydralazine )  11/25: 166/95   4. Are you having any other symptoms (ex. Dizziness, headache, blurred vision, passed out)?  No ?

## 2024-06-28 NOTE — H&P (Signed)
 History and Physical    Kaitlyn Parks FMW:[id number] DOB: 1935/04/10 DOA: 06/28/2024  I have briefly reviewed the patient's prior medical records in [facility]  PCP: [name] [name], MD  Patient coming from: home  Chief Complaint: Right arm numbness, tingling, pain  HPI: Kaitlyn Parks is a 89 y.o. female with medical history significant of CAD with stenting in 2012, hypothyroidism, hypertension, comes into the hospital with complaints of right arm numbness, tingling and pain.  She was sleeping and suddenly woke up at 3:30 AM with her entire right arm being numb, tingling and also felt pins/needles and pain sensation.  She tells me that she occasionally gets this when she sleeps on the right side, but this time she was not.  Her symptoms lasted for about 90 minutes, and around 5 AM she felt back to normal and went back to sleep.  When she woke up, she called her doctor's office who directed her to come to the emergency room to get checked out.  When she sleeps on the right side and gets right arm numbness, symptoms usually resolve quickly and do not last this long.  Otherwise she denies any chest pain, denies any palpitations.  She denies any fever or chills.  She does notice that her legs have been more swollen recently, and her shoes feel tight at times, and this is new for her.  She also mentions that when she woke up and checked her blood pressure it was quite elevated into the 170s-180s systolic, and she took 3 nitroglycerin  as instructed by her cardiologist in the past.  ED Course: In the emergency room she is afebrile, bradycardic with a heart rate into the 50s-60s, hypertensive with a blood pressure in the 160s-170s.  She is satting well on room air.  Blood work is fairly unremarkable except for a BNP which was elevated 464.  Troponin is negative.  CBC and renal function are unremarkable.  An MRI of the brain was negative.  We are asked to admit for TIA workup.  Review of Systems:  All systems reviewed, and apart from HPI, all negative  Past Medical History:  Diagnosis Date   Arthritis    DDD, scoliosis, sees Dr. Mavis for this, uses norco very rarely for pain   Arthritis    lumbar stenosis, gets steroid injections for [facility] [facility]   Bradycardia 11/11/2017   CAD (coronary artery disease)    LAD stenting of a 90% lesion 2012   Cystocele    Educated about COVID-19 virus infection 12/06/2019   Elevated cholesterol    GERD (gastroesophageal reflux disease)    dx in work up 2016 for atypical CP at OSH   pt. denies   Heart murmur    Hypertensive retinopathy    OU   Hypothyroidism    Interstitial cystitis    sees Dr. Watt   Macular degeneration    OU   Neuromuscular disorder [facility])    neuropathy   NSTEMI (non-ST elevated myocardial infarction) (HCC)    Osteoporosis    Rectocele    S/P hip replacement, right 06/12/2017   Scoliosis    Thyroid  disease    Hypothyroid   Urinary incontinence    USI   Uterine prolapse     Past Surgical History:  Procedure Laterality Date   BLADDER SUSPENSION  2011   CATARACT EXTRACTION Bilateral 2015   Dr. Cleatus   CORONARY ANGIOPLASTY WITH STENT PLACEMENT  04/21/2010   LAD 80%, RCA 30%, nl  EF, s/p DES LAD   EYE SURGERY     LEFT HEART CATH AND CORONARY ANGIOGRAPHY N/A 06/14/2017   Procedure: LEFT HEART CATH AND CORONARY ANGIOGRAPHY;  Surgeon: Court Dorn PARAS, MD;  Location: [facility];  Service: Cardiovascular;  Laterality: N/A;   OOPHORECTOMY  2011   BSO   TOTAL HIP ARTHROPLASTY Right 06/10/2017   Procedure: RIGHT TOTAL HIP ARTHROPLASTY ANTERIOR APPROACH;  Surgeon: Fidel Rogue, MD;  Location: [facility];  Service: Orthopedics;  Laterality: Right;  Needs RNFA   TOTAL HIP ARTHROPLASTY Left 01/27/2024   Procedure: ARTHROPLASTY, HIP, TOTAL, ANTERIOR APPROACH;  Surgeon: Fidel Rogue, MD;  Location: [facility];  Service: Orthopedics;  Laterality: Left;   VAGINAL HYSTERECTOMY  2011   LAVH BSO; benign     reports  that she has never smoked. She has never used smokeless tobacco. She reports that she does not drink alcohol  and does not use drugs.  Allergies  Allergen Reactions   Acyclovir And Related Other (See Comments)    Reaction not recalled   Ciprofloxacin  Other (See Comments)    Dizziness and weakness   Pravachol  [Pravastatin  Sodium] Other (See Comments)    Cystitis   Sulfa  Antibiotics Nausea Only   Zocor  [Simvastatin ] Other (See Comments)    Cystitis    Family History  Problem Relation Age of Onset   Hypertension Mother    Heart disease Mother    Heart disease Father    Heart disease Sister    Diabetes Sister    Uterine cancer Sister        mets to lungs   Lung cancer Sister    Scoliosis Sister    Heart disease Brother    Colon cancer Paternal Aunt 23   Breast cancer Paternal Aunt        Age 74's   Breast cancer Paternal Aunt    Leukemia Paternal Aunt    Lung cancer Paternal Grandfather        smoker   Arthritis Daughter    Breast cancer Cousin        Maternal 1st cousins-Age 29's   Esophageal cancer Neg Hx    Pancreatic cancer Neg Hx    Stomach cancer Neg Hx     Prior to Admission medications   Medication Sig Start Date End Date Taking? Authorizing Provider  amLODipine  (NORVASC ) 2.5 MG tablet Take 1 tablet (2.5 mg total) by mouth daily. Patient taking differently: Take 2.5 mg by mouth daily in the afternoon. 02/17/24  Yes [name] HERO, MD  aspirin  EC 81 MG tablet Take 81 mg by mouth at bedtime. Swallow whole.   Yes [provider]  Aflibercept  (EYLEA  IZ) 1 Dose by Intravitreal route See admin instructions. Every 7 weeks    [provider]  Biotin w/ Vitamins C & E (HAIR/SKIN/NAILS PO) Take 1 tablet by mouth daily with lunch.    [provider]  Brimonidine  Tartrate (LUMIFY ) 0.025 % SOLN Place 1 drop into both eyes every 4 (four) hours as needed (irritated eyes).    [provider]  Cranberry 400 MG CAPS Take 400 mg by mouth in the  morning.    [provider]  hydrALAZINE  (APRESOLINE ) 10 MG tablet TAKE (1) TABLET TWICE A DAY. MAY TAKE EXTRA DOSE FOR SBP >160 UP TO A TOTAL OF 4 TIMES A DAY AS NEEDED 09/09/23   Lavona Agent, MD  isosorbide  mononitrate (IMDUR ) 60 MG 24 hr tablet TAKE 1 & 1/2 TABLETS by mouth once A  DAY. 05/08/24   [name] HERO, MD  Lidocaine -Menthol  3.5-7 % PTCH Place 1 patch onto the skin 2 (two) times daily as needed (knee/hip pain.).    [provider]  Multiple Vitamins-Calcium  (ONE-A-DAY WOMENS PO) Take 1 tablet by mouth daily with lunch.    [provider]  nitroGLYCERIN  (NITROSTAT ) 0.4 MG SL tablet DISSOLVE 1 TABLET UNDER TONGUE IF NEEDED FOR CHEST PAIN. MAY REPEAT IN 5 MINUTES FOR 3 DOSES. 04/24/24   Lavona Agent, MD  NON FORMULARY Get steroid inject for SI joint every 12 wks with Dr. Darlis Leash surgery spine    [provider]  pantoprazole  (PROTONIX ) 40 MG tablet Take 1 tablet (40 mg total) by mouth daily. 03/03/24   Abran Norleen SAILOR, MD  rosuvastatin  (CRESTOR ) 10 MG tablet TABLET ONE-HALF TABLET BY MOUTH DAILY Patient taking differently: Take 5 mg by mouth daily. 10/22/23   Lavona Agent, MD  SYNTHROID  50 MCG tablet TAKE 1 TABLET EACH DAY. NEED APPOINTMENT FOR ADDITIONAL REFILLS Patient taking differently: Take 50 mcg by mouth daily before breakfast. 04/24/24   [name] HERO, MD  tiZANidine  (ZANAFLEX ) 4 MG tablet Take 1 tablet (4 mg total) by mouth every 6 (six) hours as needed for muscle spasms. 04/26/24   [name] HERO, MD    Physical Exam: Vitals:   06/28/24 1027 06/28/24 1028 06/28/24 1230 06/28/24 1449  BP:   (!) 168/76 (!) 175/81  Pulse:   63 61  Resp:   13 16  Temp:    98 F (36.7 C)  TempSrc:    Oral  SpO2: 100%  95% 99%  Weight:  58.5 kg    Height:  5' 3 (1.6 m)      Constitutional: NAD, calm, comfortable Eyes: PERRL, lids and conjunctivae normal ENMT: Mucous membranes are moist. Posterior pharynx clear of any exudate or  lesions.Normal dentition.  Neck: normal, supple Respiratory: clear to auscultation bilaterally, no wheezing, no crackles. Normal respiratory effort. No accessory muscle use.  Cardiovascular: Regular rate and rhythm, no murmurs / rubs / gallops.  Trace edema Abdomen: no tenderness, no masses palpated. Bowel sounds positive.  Musculoskeletal: no clubbing / cyanosis. Normal muscle tone.  Skin: no rashes, lesions, ulcers. No induration Neurologic: No focal deficits  Labs on Admission: I have personally reviewed following labs and imaging studies  CBC: Recent Labs  Lab 06/28/24 0951  WBC 4.8  HGB 12.8  HCT 40.9  MCV 94.9  PLT 266   Basic Metabolic Panel: Recent Labs  Lab 06/28/24 0951  NA 140  K 4.3  CL 108  CO2 25  GLUCOSE 120*  BUN 21  CREATININE 0.67  CALCIUM  8.3*   Liver Function Tests: Recent Labs  Lab 06/28/24 0951  AST 21  ALT 13  ALKPHOS 115  BILITOT 0.3  PROT 6.4*  ALBUMIN 4.0   Coagulation Profile: No results for input(s): INR, PROTIME in the last 168 hours. BNP (last 3 results) Recent Labs    06/28/24 0951  PROBNP 464.0*   CBG: No results for input(s): GLUCAP in the last 168 hours. Thyroid  Function Tests: No results for input(s): TSH, T4TOTAL, FREET4, T3FREE, THYROIDAB in the last 72 hours. Urine analysis:    Component Value Date/Time   COLORURINE COLORLESS (A) 02/08/2024 0018   APPEARANCEUR CLEAR 02/08/2024 0018   LABSPEC 1.009 02/08/2024 0018   PHURINE 6.0 02/08/2024 0018   GLUCOSEU NEGATIVE 02/08/2024 0018   GLUCOSEU NEGATIVE 09/11/2020 1041   HGBUR NEGATIVE 02/08/2024 0018   BILIRUBINUR NEGATIVE  02/08/2024 0018   BILIRUBINUR Negative 10/27/2022 1556   KETONESUR NEGATIVE 02/08/2024 0018   PROTEINUR NEGATIVE 02/08/2024 0018   UROBILINOGEN 0.2 10/27/2022 1556   UROBILINOGEN 0.2 09/11/2020 1041   NITRITE NEGATIVE 02/08/2024 0018   LEUKOCYTESUR NEGATIVE 02/08/2024 0018     Radiological Exams on Admission: MR BRAIN WO  CONTRAST Result Date: 06/28/2024 EXAM: MRI BRAIN WITHOUT CONTRAST 06/28/2024 02:01:19 PM TECHNIQUE: Multiplanar multisequence MRI of the head/brain was performed without the administration of intravenous contrast. COMPARISON: Head MRI 02/14/2023 CLINICAL HISTORY: Stroke, follow up. Right arm pain. FINDINGS: BRAIN AND VENTRICLES: There is no evidence of an acute infarct, intracranial hemorrhage, mass, midline shift, hydrocephalus, or extra-axial fluid collection. Mild cerebral atrophy is within normal limits for age. No significant white matter disease is seen for age. Major intracranial vascular flow voids are preserved. ORBITS: Bilateral cataract extraction. SINUSES AND MASTOIDS: No acute abnormality. BONES AND SOFT TISSUES: Normal marrow signal. No acute soft tissue abnormality. IMPRESSION: 1. Unremarkable appearance of the brain for age. Electronically signed by: Dasie Hamburg MD 06/28/2024 02:44 PM EST RP Workstation: [id number]   DG Chest 2 View Result Date: 06/28/2024 EXAM: 2 VIEW(S) XRAY OF THE CHEST 06/28/2024 10:11:00 AM COMPARISON: Chest radiograph 04/23/2022. CLINICAL HISTORY: cardiac sx FINDINGS: LUNGS AND PLEURA: No focal pulmonary opacity. No pleural effusion. No pneumothorax. HEART AND MEDIASTINUM: Aortic arch calcifications. No acute abnormality of the cardiac and mediastinal silhouettes. BONES AND SOFT TISSUES: Thoracic spondylosis. IMPRESSION: 1. No acute cardiopulmonary process. Electronically signed by: Donnice Mania MD 06/28/2024 10:41 AM EST RP Workstation: [id number]    EKG: Independently reviewed.  Sinus rhythm  Assessment/Plan Principal problem TIA, right arm numbness -patient has had similar symptoms in the past when she sleeps on the right side, however numbness resolved within minutes.  This time she was sleeping on her back and symptoms lasted for an hour and a half.  MRI of the brain is negative - Complete TIA workup with CT angiogram head and neck, 2D echocardiogram, lipid  panel, A1c, PT/OT.  Neurology consulted, OK for the patient to stay at [facility] long and they will see her here tomorrow, discussed with Dr. Nichola - Obtain MRI C-spine to rule out other lesions that could explain her recurrent right-sided numbness- - placed on aspirin   Active problems Essential hypertension-resume home medications  Coronary artery disease-no chest pain, negative troponin.  Continue Imdur , statin  Lower extremity swelling-new, obtain 2D echo as above.  Osteoporosis-she is on Prolia  as an outpatient  Hypothyroidism-continue home Synthroid   DVT prophylaxis: Lovenox   Code Status: Full code  Family Communication: no family at bedside  Bed Type: telemetry  Consults called: Neurology   Obs/Inp: Obs   Nilda Fendt, MD, PhD [facility]  Contact via [email or web address]  06/28/2024, 4:23 PM

## 2024-06-28 NOTE — ED Notes (Signed)
Pt gone for MRI.  

## 2024-06-29 ENCOUNTER — Observation Stay (HOSPITAL_COMMUNITY)

## 2024-06-29 DIAGNOSIS — G589 Mononeuropathy, unspecified: Secondary | ICD-10-CM

## 2024-06-29 DIAGNOSIS — G459 Transient cerebral ischemic attack, unspecified: Secondary | ICD-10-CM

## 2024-06-29 LAB — ECHOCARDIOGRAM COMPLETE
AR max vel: 2.21 cm2
AV Area VTI: 2.36 cm2
AV Area mean vel: 2.27 cm2
AV Mean grad: 2 mmHg
AV Peak grad: 4.2 mmHg
Ao pk vel: 1.03 m/s
Area-P 1/2: 3.73 cm2
Height: 63 in
S' Lateral: 2.8 cm
Weight: 2063.51 [oz_av]

## 2024-06-29 LAB — LIPID PANEL
Cholesterol: 127 mg/dL (ref 0–200)
HDL: 56 mg/dL (ref >40)
LDL Cholesterol: 54 mg/dL (ref 0–99)
Total CHOL/HDL Ratio: 2.3 ratio
Triglycerides: 85 mg/dL (ref <150)
VLDL: 17 mg/dL (ref 0–40)

## 2024-06-29 LAB — HEMOGLOBIN A1C
Hgb A1c MFr Bld: 5.3 % (ref 4.8–5.6)
Mean Plasma Glucose: 105 mg/dL

## 2024-06-29 MED ORDER — GADOBUTROL 1 MMOL/ML IV SOLN
6.0000 mL | Freq: Once | INTRAVENOUS | Status: AC | PRN
  Administered 2024-06-29: 6 mL via INTRAVENOUS

## 2024-06-29 NOTE — Consult Note (Signed)
 NEUROLOGY CONSULT NOTE   Date of service: June 29, 2024 Patient Name: Kaitlyn Parks MRN:  [id number] DOB:  Aug 28, 1934 Chief Complaint: Right arm tingling Requesting Provider: Jillian Buttery, MD  History of Present Illness  Kaitlyn Parks is a 89 y.o. female with right arm numbness and tingling seems to be the entire arm.  It was present in the past seems to be intermittent.  Numbness is still present.  MRI is negative for stroke.  No weakness.  NIHSS components Score: Comment  1a Level of Conscious 0[x]  1[]  2[]  3[]      1b LOC Questions 0[x]  1[]  2[]       1c LOC Commands 0[x]  1[]  2[]       2 Best Gaze 0[x]  1[]  2[]       3 Visual 0[x]  1[]  2[]  3[]      4 Facial Palsy 0[x]  1[]  2[]  3[]      5a Motor Arm - left 0[x]  1[]  2[]  3[]  4[]  UN[]    5b Motor Arm - Right 0[x]  1[]  2[]  3[]  4[]  UN[]    6a Motor Leg - Left 0[x]  1[]  2[]  3[]  4[]  UN[]    6b Motor Leg - Right 0[x]  1[]  2[]  3[]  4[]  UN[]    7 Limb Ataxia 0[x]  1[]  2[]  UN[]      8 Sensory 0[x]  1[]  2[]  UN[]      9 Best Language 0[x]  1[]  2[]  3[]      10 Dysarthria 0[x]  1[]  2[]  UN[]      11 Extinct. and Inattention 0[x]  1[]  2[]       TOTAL:       ROS  Comprehensive ROS performed and pertinent positives documented in HPI    Past History   Past Medical History:  Diagnosis Date   Arthritis    DDD, scoliosis, sees Dr. Mavis for this, uses norco very rarely for pain   Arthritis    lumbar stenosis, gets steroid injections for [facility] [place] [facility]   Bradycardia 11/11/2017   CAD (coronary artery disease)    LAD stenting of a 90% lesion 2012   Cystocele    Educated about COVID-19 virus infection 12/06/2019   Elevated cholesterol    GERD (gastroesophageal reflux disease)    dx in work up 2016 for atypical CP at OSH   pt. denies   Heart murmur    Hypertensive retinopathy    OU   Hypothyroidism    Interstitial cystitis    sees Dr. Watt   Macular degeneration    OU   Neuromuscular disorder [facility])    neuropathy   NSTEMI (non-ST elevated myocardial  infarction) (HCC)    Osteoporosis    Rectocele    S/P hip replacement, right 06/12/2017   Scoliosis    Thyroid  disease    Hypothyroid   Urinary incontinence    USI   Uterine prolapse     Past Surgical History:  Procedure Laterality Date   BLADDER SUSPENSION  2011   CATARACT EXTRACTION Bilateral 2015   Dr. Cleatus   CORONARY ANGIOPLASTY WITH STENT PLACEMENT  04/21/2010   LAD 80%, RCA 30%, nl EF, s/p DES LAD   EYE SURGERY     LEFT HEART CATH AND CORONARY ANGIOGRAPHY N/A 06/14/2017   Procedure: LEFT HEART CATH AND CORONARY ANGIOGRAPHY;  Surgeon: Court Dorn PARAS, MD;  Location: [facility];  Service: Cardiovascular;  Laterality: N/A;   OOPHORECTOMY  2011   BSO   TOTAL HIP ARTHROPLASTY Right 06/10/2017   Procedure: RIGHT TOTAL HIP ARTHROPLASTY ANTERIOR APPROACH;  Surgeon: Fidel Rogue, MD;  Location: [facility];  Service: Orthopedics;  Laterality: Right;  Needs RNFA   TOTAL HIP ARTHROPLASTY Left 01/27/2024   Procedure: ARTHROPLASTY, HIP, TOTAL, ANTERIOR APPROACH;  Surgeon: Fidel Rogue, MD;  Location: [facility];  Service: Orthopedics;  Laterality: Left;   VAGINAL HYSTERECTOMY  2011   LAVH BSO; benign    Family History: Family History  Problem Relation Age of Onset   Hypertension Mother    Heart disease Mother    Heart disease Father    Heart disease Sister    Diabetes Sister    Uterine cancer Sister        mets to lungs   Lung cancer Sister    Scoliosis Sister    Heart disease Brother    Colon cancer Paternal Aunt 80   Breast cancer Paternal Aunt        Age 75's   Breast cancer Paternal Aunt    Leukemia Paternal Aunt    Lung cancer Paternal Grandfather        smoker   Arthritis Daughter    Breast cancer Cousin        Maternal 1st cousins-Age 35's   Esophageal cancer Neg Hx    Pancreatic cancer Neg Hx    Stomach cancer Neg Hx     Social History  reports that she has never smoked. She has never used smokeless tobacco. She reports that she does not drink  alcohol  and does not use drugs.  Allergies  Allergen Reactions   Acyclovir And Related Other (See Comments)    Reaction not recalled   Ciprofloxacin  Other (See Comments)    Dizziness and weakness   Pravachol  [Pravastatin  Sodium] Other (See Comments)    Cystitis   Sulfa  Antibiotics Nausea Only   Zocor  [Simvastatin ] Other (See Comments)    Cystitis    Medications   Current Facility-Administered Medications:    amLODipine  (NORVASC ) tablet 2.5 mg, 2.5 mg, Oral, Q1400, Gherghe, Costin M, MD   aspirin  EC tablet 81 mg, 81 mg, Oral, Daily, Trixie, Costin M, MD, 81 mg at 06/29/24 1044   brimonidine  (ALPHAGAN ) 0.2 % ophthalmic solution 1 drop, 1 drop, Both Eyes, Q4H PRN, Gherghe, Costin M, MD   enoxaparin  (LOVENOX ) injection 40 mg, 40 mg, Subcutaneous, Q24H, Gherghe, Costin M, MD, 40 mg at 06/28/24 1737   isosorbide  mononitrate (IMDUR ) 24 hr tablet 90 mg, 90 mg, Oral, QHS, Gherghe, Costin M, MD, 90 mg at 06/28/24 2328   levothyroxine  (SYNTHROID ) tablet 50 mcg, 50 mcg, Oral, Q0600, Gherghe, Costin M, MD, 50 mcg at 06/29/24 0525   rosuvastatin  (CRESTOR ) tablet 5 mg, 5 mg, Oral, QPM, Gherghe, Costin M, MD, 5 mg at 06/28/24 2329  Vitals   Vitals:   06/28/24 1735 06/28/24 2125 06/29/24 0136 06/29/24 0540  BP: (!) 171/72 (!) 157/69 135/63 (!) 143/70  Pulse: (!) 54 (!) 58 62 61  Resp: 18 16 20 18   Temp: 97.7 F (36.5 C) 97.8 F (36.6 C) 97.8 F (36.6 C) (!) 97.5 F (36.4 C)  TempSrc: Oral Oral Oral Oral  SpO2: 97% 95% 94% 96%  Weight:      Height:        Body mass index is 22.85 kg/m.   Physical Exam   Constitutional: Appears well-developed and well-nourished.  Psych: Affect appropriate to situation.  Eyes: No scleral injection.  HENT: No OP obstruction.  Head: Normocephalic.  Cardiovascular: Normal rate and regular rhythm.  Respiratory: Effort normal, non-labored breathing.  GI: Soft.  No distension. There is no tenderness.  Skin:  WDI.   Neurologic Examination      Labs/Imaging/Neurodiagnostic studies   CBC:  Recent Labs  Lab Jul 22, 2024 0951  WBC 4.8  HGB 12.8  HCT 40.9  MCV 94.9  PLT 266   Basic Metabolic Panel:  Lab Results  Component Value Date   NA 140 07/22/24   K 4.3 07-22-2024   CO2 25 22-Jul-2024   GLUCOSE 120 (H) 22-Jul-2024   BUN 21 2024/07/22   CREATININE 0.67 22-Jul-2024   CALCIUM  8.3 (L) 07-22-24   GFRNONAA >60 07-22-24   GFRAA >60 10/09/2018   Lipid Panel:  Lab Results  Component Value Date   LDLCALC 54 06/29/2024   HgbA1c:  Lab Results  Component Value Date   HGBA1C 5.3 07-22-24   Urine Drug Screen: No results found for: LABOPIA, COCAINSCRNUR, LABBENZ, AMPHETMU, THCU, LABBARB  Alcohol  Level No results found for: [facility] INR  Lab Results  Component Value Date   INR 0.9 02/07/2024   APTT  Lab Results  Component Value Date   APTT 29 04/18/2010   AED levels: No results found for: PHENYTOIN, ZONISAMIDE, LAMOTRIGINE, LEVETIRACETA   MRI Brain(Personally reviewed): Neg for CVA.    ASSESSMENT   ORIS CALMES is a 89 y.o. female with right sided numbness that seems to have resolved however she still says she has a sensation of numbness but on exam she does not have any sensory deficit.  Certainly she is at risk for stroke given her age.  Her LDL is in the 50s.  She has taken aspirin  at home.  I wonder if she may have a superimposed compressive neuropathy.  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 ______________________________________________________________________    Signed, Zeke [name] Raddle, MD [facility]

## 2024-06-29 NOTE — Discharge Summary (Signed)
 Physician Discharge Summary  Kaitlyn Parks FMW:[id number] DOB: 1934/10/23 DOA: 06/28/2024  PCP: [name] [name], MD  Admit date: 06/28/2024 Discharge date: 06/29/2024  Admitted From: Home Disposition:  Home  Discharge Condition:Stable CODE STATUS:FULL Diet recommendation: Heart Healthy   Brief/Interim Summary: Kaitlyn Parks is a 89 y.o. female with medical history significant of CAD with stenting in 2012, hypothyroidism, hypertension, comes into the hospital with complaints of right arm numbness, tingling and pain.  In the emergency room she is afebrile, bradycardic with a heart rate into the 50s-60s, hypertensive with a blood pressure in the 160s-170s.  She is satting well on room air.  Blood work is fairly unremarkable except for a BNP which was elevated 464.  Troponin is negative.  She was placed on observation for suspicion of stroke/TIA.  Neurology consulted.  Started stroke workup.  CT head, MRI of the brain, CTA head and neck did not show any acute findings, no large vessel occlusion.  PT recommended no follow-up required.  Echocardiogram showed normal EF , no intracardiac source of emboli.  She is medically stable for discharge home today.  Following problems were addressed during the hospitalization:  TIA, right arm numbness -patient has had similar symptoms in the past when she sleeps on the right side, however numbness resolved within minutes.  This time she was sleeping on her back and symptoms lasted for an hour and a half.  MRI of the brain is negative.  CT angio head and neck did not show any large vessel occlusion.  Neurology recommended to continue aspirin  81 mg daily at home that she was taking before.  LDL of 54.  A1c 5.3.  Echo showed EF of 60 to 65%, no intracardiac source emboli. She can follow-up with [facility] neurology as an outpatient. Her neurological symptom has resolved.  PT did not recommend any follow-up  Essential hypertension-resume home medications    Coronary artery disease-no chest pain, negative troponin.  Continue Imdur , statin   Lower extremity swelling: Echo showed EF of 60 to 65%, indeterminate left ventricular diastolic parameters   Osteoporosis-she is on Prolia  as an outpatient   Hypothyroidism-continue home Synthroid    Discharge Diagnoses:  Principal Problem:   TIA (transient ischemic attack)    Discharge Instructions  Discharge Instructions     Diet - low sodium heart healthy   Complete by: As directed    Discharge instructions   Complete by: As directed    1)Please take your medications as instructed 2)Follow up with your PCP in a week   Increase activity slowly   Complete by: As directed       Allergies as of 06/29/2024       Reactions   Acyclovir And Related Other (See Comments)   Reaction not recalled   Ciprofloxacin  Other (See Comments)   Dizziness and weakness   Pravachol  [pravastatin  Sodium] Other (See Comments)   Cystitis   Sulfa  Antibiotics Nausea Only   Zocor  [simvastatin ] Other (See Comments)   Cystitis        Medication List     TAKE these medications    Advil 200 MG Caps Generic drug: Ibuprofen Take 200-400 mg by mouth 2 (two) times daily as needed (for mild pain or muscle soreness).   amLODipine  2.5 MG tablet Commonly known as: NORVASC  Take 1 tablet (2.5 mg total) by mouth daily. What changed: when to take this   aspirin  EC 81 MG tablet Take 81 mg by mouth at bedtime. Swallow whole.   CALCIUM   PO Take 1 tablet by mouth every other day.   Claritin 10 MG tablet Generic drug: loratadine Take 10 mg by mouth daily as needed (for seasonal allergies).   Cranberry 400 MG Caps Take 400 mg by mouth in the morning.   EYLEA  IZ by Intravitreal route See admin instructions. Every 12 weeks   FERROUS SULFATE PO Take 1 tablet by mouth every other day.   hydrALAZINE  10 MG tablet Commonly known as: APRESOLINE  TAKE (1) TABLET TWICE A DAY. MAY TAKE EXTRA DOSE FOR SBP >160 UP TO  A TOTAL OF 4 TIMES A DAY AS NEEDED What changed:  how much to take how to take this when to take this reasons to take this additional instructions   isosorbide  mononitrate 60 MG 24 hr tablet Commonly known as: IMDUR  TAKE 1 & 1/2 TABLETS by mouth once A DAY. What changed: See the new instructions.   Lidocaine -Menthol  3.5-7 % Ptch Place 1 patch onto the skin 2 (two) times daily as needed (knee/hip pain.).   Lumify  0.025 % Soln Generic drug: Brimonidine  Tartrate Place 1 drop into both eyes every 4 (four) hours as needed (dryness).   nitroGLYCERIN  0.4 MG SL tablet Commonly known as: NITROSTAT  DISSOLVE 1 TABLET UNDER TONGUE IF NEEDED FOR CHEST PAIN. MAY REPEAT IN 5 MINUTES FOR 3 DOSES. What changed: See the new instructions.   NON FORMULARY Unnamed steroid injection for SI joint every 12 wks with Dr. Darlis @ [facility]   Ocean Nasal Spray 0.65 % nasal spray Generic drug: sodium chloride  Place 1 spray into the nose See admin instructions. Instill 1 spray into each nostil in the morning and as needed for nasal congestion   ondansetron  4 MG disintegrating tablet Commonly known as: ZOFRAN -ODT Take 4 mg by mouth every 8 (eight) hours as needed for nausea or vomiting (dissolve orally).   ONE-A-DAY WOMENS PO Take 1 tablet by mouth daily with lunch.   pantoprazole  40 MG tablet Commonly known as: Protonix  Take 1 tablet (40 mg total) by mouth daily.   rosuvastatin  10 MG tablet Commonly known as: CRESTOR  TABLET ONE-HALF TABLET BY MOUTH DAILY What changed: See the new instructions.   Synthroid  50 MCG tablet Generic drug: levothyroxine  TAKE 1 TABLET EACH DAY. NEED APPOINTMENT FOR ADDITIONAL REFILLS What changed: See the new instructions.   tiZANidine  4 MG tablet Commonly known as: Zanaflex  Take 1 tablet (4 mg total) by mouth every 6 (six) hours as needed for muscle spasms.   traMADol  50 MG tablet Commonly known as: ULTRAM  Take 50 mg by mouth every 8 (eight)  hours as needed for moderate pain (pain score 4-6).   VITAMIN B12 PO Take 1 tablet by mouth every other day.   VITAMIN C PO Take 1 tablet by mouth every other day.        Follow-up Information     [name] HERO, MD. Schedule an appointment as soon as possible for a visit in 1 week(s).   Specialty: Family Medicine Contact information: [address] [place] [address] [phone]                Allergies  Allergen Reactions   Acyclovir And Related Other (See Comments)    Reaction not recalled   Ciprofloxacin  Other (See Comments)    Dizziness and weakness   Pravachol  [Pravastatin  Sodium] Other (See Comments)    Cystitis   Sulfa  Antibiotics Nausea Only   Zocor  [Simvastatin ] Other (See Comments)    Cystitis    Consultations:  Neurology   Procedures/Studies: ECHOCARDIOGRAM COMPLETE Result Date: 06/29/2024    ECHOCARDIOGRAM REPORT   Patient Name:   LIYA STROLLO Date of Exam: 06/29/2024 Medical Rec #:  [id number]      Height:       63.0 in Accession #:    [id number]     Weight:       129.0 lb Date of Birth:  Jan 09, 1935      BSA:          1.605 m Patient Age:    89 years       BP:           143/70 mmHg Patient Gender: F              HR:           59 bpm. Exam Location:  Inpatient Procedure: 2D Echo, Cardiac Doppler and Color Doppler (Both Spectral and Color            Flow Doppler were utilized during procedure). Indications:    TIA G45.9  History:        Patient has prior history of Echocardiogram examinations, most                 recent 10/27/2021. CAD and Previous Myocardial Infarction.  Sonographer:    Jayson Gaskins Referring Phys: [phone] NILDA Kaitlyn Parks GHERGHE IMPRESSIONS  1. Left ventricular ejection fraction, by estimation, is 60 to 65%. The left ventricle has normal function. The left ventricle has no regional wall motion abnormalities. Left ventricular diastolic parameters are indeterminate.  2. Right ventricular systolic function is normal. The right  ventricular size is normal.  3. The mitral valve is normal in structure. Mild to moderate mitral valve regurgitation. No evidence of mitral stenosis.  4. Tricuspid valve regurgitation is mild to moderate.  5. The aortic valve is normal in structure. Aortic valve regurgitation is not visualized. No aortic stenosis is present.  6. The inferior vena cava is normal in size with greater than 50% respiratory variability, suggesting right atrial pressure of 3 mmHg. FINDINGS  Left Ventricle: Left ventricular ejection fraction, by estimation, is 60 to 65%. The left ventricle has normal function. The left ventricle has no regional wall motion abnormalities. The left ventricular internal cavity size was normal in size. There is  no left ventricular hypertrophy. Left ventricular diastolic parameters are indeterminate. Right Ventricle: The right ventricular size is normal. No increase in right ventricular wall thickness. Right ventricular systolic function is normal. Left Atrium: Left atrial size was normal in size. Right Atrium: Right atrial size was normal in size. Pericardium: There is no evidence of pericardial effusion. Mitral Valve: The mitral valve is normal in structure. Mild to moderate mitral valve regurgitation. No evidence of mitral valve stenosis. Tricuspid Valve: The tricuspid valve is normal in structure. Tricuspid valve regurgitation is mild to moderate. No evidence of tricuspid stenosis. Aortic Valve: The aortic valve is normal in structure. Aortic valve regurgitation is not visualized. No aortic stenosis is present. Aortic valve mean gradient measures 2.0 mmHg. Aortic valve peak gradient measures 4.2 mmHg. Aortic valve area, by VTI measures 2.36 cm. Pulmonic Valve: The pulmonic valve was normal in structure. Pulmonic valve regurgitation is not visualized. No evidence of pulmonic stenosis. Aorta: The aortic root is normal in size and structure. Venous: The inferior vena cava is normal in size with greater than  50% respiratory variability, suggesting right atrial pressure of 3 mmHg. IAS/Shunts: No atrial level shunt detected by color flow  Doppler.  LEFT VENTRICLE PLAX 2D LVIDd:         4.00 cm   Diastology LVIDs:         2.80 cm   LV e' medial:    4.13 cm/s LV PW:         1.00 cm   LV E/e' medial:  23.8 LV IVS:        0.90 cm   LV e' lateral:   5.98 cm/s LVOT diam:     1.90 cm   LV E/e' lateral: 16.5 LV SV:         53 LV SV Index:   33 LVOT Area:     2.84 cm  RIGHT VENTRICLE RV S prime:     13.30 cm/s TAPSE (M-mode): 2.1 cm LEFT ATRIUM           Index        RIGHT ATRIUM           Index LA Vol (A2C): 51.0 ml 31.78 ml/m  RA Area:     14.00 cm LA Vol (A4C): 33.9 ml 21.13 ml/m  RA Volume:   33.10 ml  20.63 ml/m  AORTIC VALVE AV Area (Vmax):    2.21 cm AV Area (Vmean):   2.27 cm AV Area (VTI):     2.36 cm AV Vmax:           103.00 cm/s AV Vmean:          74.000 cm/s AV VTI:            0.225 m AV Peak Grad:      4.2 mmHg AV Mean Grad:      2.0 mmHg LVOT Vmax:         80.30 cm/s LVOT Vmean:        59.300 cm/s LVOT VTI:          0.187 m LVOT/AV VTI ratio: 0.83 MITRAL VALVE                TRICUSPID VALVE MV Area (PHT): 3.73 cm     TR Peak grad:   26.8 mmHg MV E velocity: 98.50 cm/s   TR Vmax:        259.00 cm/s MV A velocity: 104.00 cm/s MV E/A ratio:  0.95         SHUNTS                             Systemic VTI:  0.19 m                             Systemic Diam: 1.90 cm Kardie Tobb DO Electronically signed by Dub Huntsman DO Signature Date/Time: 06/29/2024/10:06:46 AM    Final    MR CERVICAL SPINE W WO CONTRAST Result Date: 06/29/2024 EXAM: MRI CERVICAL SPINE WITH AND WITHOUT CONTRAST 06/29/2024 08:57:41 AM TECHNIQUE: Multiplanar multisequence MRI of the cervical spine was performed without and with the administration of 6 mL (gadobutrol  (GADAVIST ) 1 MMOL/ML injection 6 mL GADOBUTROL  1 MMOL/ML IV SOLN) intravenous contrast. COMPARISON: None available. CLINICAL HISTORY: Cervical radiculopathy, no red flags. FINDINGS:  BONES AND ALIGNMENT: Slight anterolisthesis at C4-C5. Normal vertebral body heights. Marrow signal is unremarkable. No abnormal enhancement. SPINAL CORD: Normal spinal cord size. Normal spinal cord signal. SOFT TISSUES: No paraspinal mass. C2-C3: No significant disc herniation. No spinal canal stenosis or neural foraminal narrowing. C3-C4: Disc space  narrowing. The spinal canal and neural foramina are patent. The facet joints are fused. C4-C5: Slight anterolisthesis. Bilateral uncovertebral joint hypertrophy and bilateral facet arthrosis, more pronounced on the right. Mild central spinal canal stenosis and moderate right neuroforaminal stenosis. C5-C6: Degenerative disc bulging and bilateral uncovertebral joint hypertrophy, resulting in mild central spinal canal stenosis and moderate bilateral neuroforaminal stenosis. C6-C7: Broad-based disc bulge, which is eccentric to the right. Mild-to-moderate central spinal canal stenosis and moderate-to-severe right neural foraminal stenosis. There is likely impingement of the right C7 nerve in the neural foramen. C7-T1: No significant disc herniation. No spinal canal stenosis or neural foraminal narrowing. IMPRESSION: 1. C6-7: Broad-based disc bulge, eccentric to the right, resulting in mild-to-moderate central spinal canal stenosis and moderate-to-severe right neural foraminal stenosis with likely impingement of the right C7 nerve in the neural foramen. 2. C5-6: Degenerative disc bulging and bilateral uncovertebral joint hypertrophy resulting in mild central spinal canal stenosis and moderate bilateral neuroforaminal stenosis. 3. C4-5: Slight anterolisthesis, bilateral uncovertebral joint hypertrophy, and bilateral facet arthrosis (more pronounced on the right) resulting in mild central spinal canal stenosis and moderate right neuroforaminal stenosis. 4. C3-4: Disc space narrowing. Patent spinal canal and neural foramina. Fused facet joints. Electronically signed by: Evalene Coho MD 06/29/2024 09:39 AM EST RP Workstation: [id number]   CT ANGIO HEAD NECK W WO CM Result Date: 06/28/2024 EXAM: CT HEAD WITHOUT CTA HEAD AND NECK WITH AND WITHOUT 06/28/2024 04:48:34 PM TECHNIQUE: CTA of the head and neck was performed with and without the administration of intravenous contrast. Noncontrast CT of the head with reconstructed 2-D images are also provided for review. Multiplanar 2D and/or 3D reformatted images are provided for review. Automated exposure control, iterative reconstruction, and/or weight based adjustment of the mA/kV was utilized to reduce the radiation dose to as low as reasonably achievable. 75 mL of iohexol  (OMNIPAQUE ) 350 MG/ML injection was administered. COMPARISON: MRI of the head and CTA head and neck dated 02/14/2023. CLINICAL HISTORY: Stroke/TIA, determine embolic source. FINDINGS: CT HEAD: BRAIN AND VENTRICLES: No acute intracranial hemorrhage. Mild chronic microvascular ischemic changes. No edema, mass effect, or midline shift. The ventricles are unremarkable. The basilar cisterns are patent. Posterior fossa is unremarkable. No extra-axial fluid collection. No evidence of acute infarct. No hydrocephalus. ORBITS: Bilateral lens replacement. SINUSES AND MASTOIDS: No acute abnormality. CTA NECK: AORTIC ARCH AND ARCH VESSELS: Aortic arch is incompletely visualized. There is mild atherosclerosis along the proximal right subclavian artery without significant stenosis. No dissection or arterial injury. CERVICAL CAROTID ARTERIES: The right carotid artery is patent from the origin to the skull base with no hemodynamically significant stenosis. There is an irregular beaded appearance of the mid and distal right cervical ICA with areas of multifocal mild narrowing suggestive of fibromuscular dysplasia. The left carotid artery is patent from the origin to the skull base with no hemodynamically significant stenosis. There is a beaded appearance of the mid and distal left  cervical ICA with areas of mild multifocal narrowing suggestive of fibromuscular dysplasia. CERVICAL VERTEBRAL ARTERIES: The vertebral arteries are patent from the origins to the vertebrobasilar confluence. No dissection, arterial injury, or significant stenosis. LUNGS AND MEDIASTINUM: Unremarkable. SOFT TISSUES: No acute abnormality. BONES: Degenerative changes in the visualized spine. CTA HEAD: ANTERIOR CIRCULATION: The intracranial internal carotid arteries are patent bilaterally. There is mild atherosclerosis of the carotid siphons without significant stenosis. The middle cerebral arteries are patent bilaterally. The anterior cerebral arteries are patent bilaterally. No aneurysm. POSTERIOR CIRCULATION: Fetal origin of the right PCA.  No significant stenosis of the posterior cerebral arteries. No significant stenosis of the basilar artery. No significant stenosis of the vertebral arteries. No aneurysm. OTHER: No dural venous sinus thrombosis on this non-dedicated study. IMPRESSION: 1. No acute intracranial abnormality. 2. No large vessel occlusion in the head or neck. 3. Findings suggestive of fibromuscular dysplasia involving the mid and distal cervical internal carotid arteries bilaterally. 4. Mild chronic microvascular ischemic changes. Electronically signed by: Donnice Mania MD 06/28/2024 06:22 PM EST RP Workstation: [id number]   MR BRAIN WO CONTRAST Result Date: 06/28/2024 EXAM: MRI BRAIN WITHOUT CONTRAST 06/28/2024 02:01:19 PM TECHNIQUE: Multiplanar multisequence MRI of the head/brain was performed without the administration of intravenous contrast. COMPARISON: Head MRI 02/14/2023 CLINICAL HISTORY: Stroke, follow up. Right arm pain. FINDINGS: BRAIN AND VENTRICLES: There is no evidence of an acute infarct, intracranial hemorrhage, mass, midline shift, hydrocephalus, or extra-axial fluid collection. Mild cerebral atrophy is within normal limits for age. No significant white matter disease is seen for age.  Major intracranial vascular flow voids are preserved. ORBITS: Bilateral cataract extraction. SINUSES AND MASTOIDS: No acute abnormality. BONES AND SOFT TISSUES: Normal marrow signal. No acute soft tissue abnormality. IMPRESSION: 1. Unremarkable appearance of the brain for age. Electronically signed by: Dasie Hamburg MD 06/28/2024 02:44 PM EST RP Workstation: [id number]   DG Chest 2 View Result Date: 06/28/2024 EXAM: 2 VIEW(S) XRAY OF THE CHEST 06/28/2024 10:11:00 AM COMPARISON: Chest radiograph 04/23/2022. CLINICAL HISTORY: cardiac sx FINDINGS: LUNGS AND PLEURA: No focal pulmonary opacity. No pleural effusion. No pneumothorax. HEART AND MEDIASTINUM: Aortic arch calcifications. No acute abnormality of the cardiac and mediastinal silhouettes. BONES AND SOFT TISSUES: Thoracic spondylosis. IMPRESSION: 1. No acute cardiopulmonary process. Electronically signed by: Donnice Mania MD 06/28/2024 10:41 AM EST RP Workstation: [id number]   Intravitreal Injection, Pharmacologic Agent - OS - Left Eye Result Date: 06/21/2024 Time Out 06/21/2024. 2:07 PM. Confirmed correct patient, procedure, site, and patient consented. Anesthesia Topical anesthesia was used. Anesthetic medications included Lidocaine  2%, Proparacaine 0.5%. Procedure Preparation included 5% betadine  to ocular surface, eyelid speculum. A (32g) needle was used. Injection: 2 mg aflibercept  2 MG/0.05ML   Route: Intravitreal, Site: Left Eye   NDC: [id number], Lot: [id number], Expiration date: 09/02/2025, Waste: 0 mL Post-op Post injection exam found visual acuity of at least counting fingers. The patient tolerated the procedure well. There were no complications. The patient received written and verbal post procedure care education. Post injection medications were not given.   OCT, Retina - OU - Both Eyes Result Date: 06/21/2024 Right Eye Quality was good. Central Foveal Thickness: 243. Progression has been stable. Findings include no IRF, no SRF, abnormal  foveal contour, retinal drusen , intraretinal hyper-reflective material, pigment epithelial detachment, outer retinal atrophy (stable improvement in cystic changes nasal and temporal fovea, patchy ORA / GA -- no fluid). Left Eye Quality was good. Central Foveal Thickness: 259. Progression has been stable. Findings include normal foveal contour, no IRF, no SRF, retinal drusen , intraretinal hyper-reflective material, pigment epithelial detachment, outer retinal atrophy (Stable improvement in IRF/cystic changes inferior fovea, stable improvement in [facility] / PED inferior to fovea ). 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 CME - cystoid macular edema. PED - pigment epithelial  detachment. IRF - intraretinal fluid. SRF - subretinal fluid. EZ - ellipsoid zone. ERM - epiretinal membrane. ORA - outer retinal atrophy. ORT - outer retinal tubulation. SRHM - subretinal hyper-reflective material      Subjective: Patient seen and examined at bedside today.  Very comfortable.  Lying on bed.  No neurological symptoms.  Right arm numbness has resolved.  She is alert and oriented.  Ambulatory.  Medically stable for discharge to  home.I called and discussed with her daughter Kaitlyn Parks on phone about discharge planning.  Discharge Exam: Vitals:   06/29/24 0136 06/29/24 0540  BP: 135/63 (!) 143/70  Pulse: 62 61  Resp: 20 18  Temp: 97.8 F (36.6 C) (!) 97.5 F (36.4 C)  SpO2: 94% 96%   Vitals:   06/28/24 1735 06/28/24 2125 06/29/24 0136 06/29/24 0540  BP: (!) 171/72 (!) 157/69 135/63 (!) 143/70  Pulse: (!) 54 (!) 58 62 61  Resp: 18 16 20 18   Temp: 97.7 F (36.5 C) 97.8 F (36.6 C)  97.8 F (36.6 C) (!) 97.5 F (36.4 C)  TempSrc: Oral Oral Oral Oral  SpO2: 97% 95% 94% 96%  Weight:      Height:        General: Pt is alert, awake, not in acute distress Cardiovascular: RRR, S1/S2 +, no rubs, no gallops Respiratory: CTA bilaterally, no wheezing, no rhonchi Abdominal: Soft, NT, ND, bowel sounds + Extremities: no edema, no cyanosis    The results of significant diagnostics from this hospitalization (including imaging, microbiology, ancillary and laboratory) are listed below for reference.     Microbiology: No results found for this or any previous visit (from the past 240 hours).   Labs: BNP (last 3 results) No results for input(s): BNP in the last 8760 hours. Basic Metabolic Panel: Recent Labs  Lab 06/28/24 0951  NA 140  K 4.3  CL 108  CO2 25  GLUCOSE 120*  BUN 21  CREATININE 0.67  CALCIUM  8.3*   Liver Function Tests: Recent Labs  Lab 06/28/24 0951  AST 21  ALT 13  ALKPHOS 115  BILITOT 0.3  PROT 6.4*  ALBUMIN 4.0   No results for input(s): LIPASE, AMYLASE in the last 168 hours. No results for input(s): AMMONIA in the last 168 hours. CBC: Recent Labs  Lab 06/28/24 0951  WBC 4.8  HGB 12.8  HCT 40.9  MCV 94.9  PLT 266   Cardiac Enzymes: No results for input(s): CKTOTAL, CKMB, CKMBINDEX, TROPONINI in the last 168 hours. BNP: Invalid input(s): POCBNP CBG: No results for input(s): GLUCAP in the last 168 hours. D-Dimer No results for input(s): DDIMER in the last 72 hours. Hgb A1c Recent Labs    06/28/24 1636  HGBA1C 5.3   Lipid Profile Recent Labs    06/29/24 0552  CHOL 127  HDL 56  LDLCALC 54  TRIG 85  CHOLHDL 2.3   Thyroid  function studies No results for input(s): TSH, T4TOTAL, T3FREE, THYROIDAB in the last 72 hours.  Invalid input(s): FREET3 Anemia work up No results for input(s): VITAMINB12, FOLATE, FERRITIN, TIBC, IRON, RETICCTPCT in the last 72 hours. Urinalysis     Component Value Date/Time   COLORURINE COLORLESS (A) 02/08/2024 0018   APPEARANCEUR CLEAR 02/08/2024 0018   LABSPEC 1.009 02/08/2024 0018   PHURINE 6.0 02/08/2024 0018   GLUCOSEU NEGATIVE 02/08/2024 0018   GLUCOSEU NEGATIVE 09/11/2020 1041   HGBUR NEGATIVE 02/08/2024 0018   BILIRUBINUR NEGATIVE 02/08/2024 0018   BILIRUBINUR Negative 10/27/2022 1556   KETONESUR NEGATIVE  02/08/2024 0018   PROTEINUR NEGATIVE 02/08/2024 0018   UROBILINOGEN 0.2 10/27/2022 1556   UROBILINOGEN 0.2 09/11/2020 1041   NITRITE NEGATIVE 02/08/2024 0018   LEUKOCYTESUR NEGATIVE 02/08/2024 0018   Sepsis Labs Recent Labs  Lab 06/28/24 0951  WBC 4.8   Microbiology No results found for this or any previous visit (from the past 240 hours).  Please note: You were cared for by a hospitalist during your hospital stay. Once you are discharged, your primary care physician will handle any further medical issues. Please note that NO REFILLS for any discharge medications will be authorized once you are discharged, as it is imperative that you return to your primary care physician (or establish a relationship with a primary care physician if you do not have one) for your post hospital discharge needs so that they can reassess your need for medications and monitor your lab values.    Time coordinating discharge: 40 minutes  SIGNED:   Ivonne Mustache, MD  [facility] 06/29/2024, 11:30 AM Pager [phone]  If 7PM-7AM, please contact night-coverage [email or web address] Password [email or web address]

## 2024-06-29 NOTE — Evaluation (Signed)
 Physical Therapy Evaluation Patient Details Name: Kaitlyn Parks MRN: [id number] DOB: [birth year] Today's Date: 06/29/2024  History of Present Illness  Pt is an 89y.o. female admitted on 11/26 after waking up at 3:30am with right arm numbness, tingling, and pain. PMH includes: CAD, hypothyroidism, and HTN. MR brain and head CT was negative for acute changes. Cervical MRI reveals degenerative disc changes at C5-7 with mild-moderate central spinal canal stenosis.  Clinical Impression  Pt ambulating around room upon PT arrival without a device, agreeable to therapy evaluation. Pt demonstrates good balance and independence with all tasks completed during session. She has a daughter that lives with her 'most of the time as she is in the process of moving here full time from [place] . The patient also has a daughter in [place] who helps out as needed and many supportive neighbors and friends. She is functioning at her baseline and does not require any additional PT services. PT will sign off at this time.         If plan is discharge home, recommend the following:     Can travel by private vehicle        Equipment Recommendations    Recommendations for Other Services       Functional Status Assessment Patient has had a recent decline in their functional status and demonstrates the ability to make significant improvements in function in a reasonable and predictable amount of time.     Precautions / Restrictions Precautions Precautions: None Restrictions Weight Bearing Restrictions Per Provider Order: No      Mobility  Bed Mobility Overal bed mobility: Independent                  Transfers Overall transfer level: Independent                      Ambulation/Gait Ambulation/Gait assistance: Supervision Gait Distance (Feet): 250 Feet Assistive device: None Gait Pattern/deviations: Step-through pattern   Gait velocity interpretation: >2.62 ft/sec, indicative of  community ambulatory      Stairs Stairs: Yes Stairs assistance: Supervision Stair Management: One rail Left Number of Stairs: 10 General stair comments: step through gait pattern, slowed speed for safety  Wheelchair Mobility     Tilt Bed    Modified Rankin (Stroke Patients Only)       Balance Overall balance assessment: Independent                               Standardized Balance Assessment Standardized Balance Assessment : Dynamic Gait Index, Berg Balance Test Berg Balance Test Sit to Stand: Able to stand without using hands and stabilize independently Standing Unsupported: Able to stand safely 2 minutes Sitting with Back Unsupported but Feet Supported on Floor or Stool: Able to sit safely and securely 2 minutes Stand to Sit: Sits safely with minimal use of hands Transfers: Able to transfer safely, minor use of hands Standing Unsupported with Eyes Closed: Able to stand 10 seconds safely Standing Ubsupported with Feet Together: Able to place feet together independently and stand 1 minute safely From Standing, Reach Forward with Outstretched Arm: Can reach forward >12 cm safely (5) From Standing Position, Pick up Object from Floor: Able to pick up shoe safely and easily From Standing Position, Turn to Look Behind Over each Shoulder: Looks behind one side only/other side shows less weight shift Turn 360 Degrees: Able to turn 360 degrees safely in 4 seconds or  less Standing Unsupported, Alternately Place Feet on Step/Stool: Able to stand independently and safely and complete 8 steps in 20 seconds Standing Unsupported, One Foot in Front: Able to plae foot ahead of the other independently and hold 30 seconds Standing on One Leg: Able to lift leg independently and hold equal to or more than 3 seconds Total Score: 51 Dynamic Gait Index Level Surface: Normal Change in Gait Speed: Normal Gait with Horizontal Head Turns: Normal Gait with Vertical Head Turns: Mild  Impairment Gait and Pivot Turn: Normal Step Over Obstacle: Mild Impairment Step Around Obstacles: Normal Steps: Mild Impairment Total Score: 21       Pertinent Vitals/Pain Pain Assessment Pain Assessment: No/denies pain    Home Living Family/patient expects to be discharged to:: Private residence Living Arrangements: Children Available Help at Discharge: Family Type of Home: House Home Access: Stairs to enter Entrance Stairs-Rails: Right;Left;Can reach both Secretary/administrator of Steps: 3 Alternate Level Stairs-Number of Steps: 2 steps from sunroom to rest of the home; flight of steps up to 2nd with bilateral handrails Home Layout: Two level;1/2 bath on main level Home Equipment: Agricultural Consultant (2 wheels);BSC/3in1;Shower seat;Cane - single point      Prior Function Prior Level of Function : Independent/Modified Independent             Mobility Comments: pt IND with all mobility and no longer needing equipment. She participates in water  aerobics x2/week, bikes at the gym, and enjoys participating in several clubs and church activities with her friends and daughters ADLs Comments: IND with all ADLs, IADLs, and still drives     Extremity/Trunk Assessment   Upper Extremity Assessment Upper Extremity Assessment: Overall WFL for tasks assessed    Lower Extremity Assessment Lower Extremity Assessment: Overall WFL for tasks assessed       Communication   Communication Communication: No apparent difficulties    Cognition Arousal: Alert Behavior During Therapy: WFL for tasks assessed/performed                             Following commands: Intact       Cueing       General Comments      Exercises     Assessment/Plan    PT Assessment Patient does not need any further PT services  PT Problem List         PT Treatment Interventions      PT Goals (Current goals can be found in the Care Plan section)  Acute Rehab PT Goals Patient Stated  Goal: get back to all my exercise activities PT Goal Formulation: With patient Time For Goal Achievement: 07/13/24 Potential to Achieve Goals: Good    Frequency       Co-evaluation               AM-PAC PT 6 Clicks Mobility  Outcome Measure Help needed turning from your back to your side while in a flat bed without using bedrails?: None Help needed moving from lying on your back to sitting on the side of a flat bed without using bedrails?: None Help needed moving to and from a bed to a chair (including a wheelchair)?: None Help needed standing up from a chair using your arms (e.g., wheelchair or bedside chair)?: None Help needed to walk in hospital room?: None Help needed climbing 3-5 steps with a railing? : A Little 6 Click Score: 23    End of Session Equipment Utilized  During Treatment: Gait belt Activity Tolerance: Patient tolerated treatment well Patient left: in chair (pt IND ambulating around room upon PT arrival; left seated in recliner with no alarms)   PT Visit Diagnosis: Unsteadiness on feet (R26.81)    Time: 8968-8956 PT Time Calculation (min) (ACUTE ONLY): 12 min   Charges:   PT Evaluation $PT Eval Low Complexity: 1 Low             Shirel Mallis H. Sofiya Ezelle, PT, DPT   [employer] 06/29/2024, 11:29 AM

## 2024-06-29 NOTE — Progress Notes (Signed)
 OT Cancellation Note  Patient Details Name: Kaitlyn Parks MRN: [id number] DOB: 02-Sep-1934   Cancelled Treatment:    Reason Eval/Treat Not Completed: OT screened, no needs identified, will sign off Per PT, pt at baseline for ADLs/mobility. No further RUE symptoms reported. No formal OT eval needed at this time  Mliss Fish 06/29/2024, 11:39 AM

## 2024-06-29 NOTE — Plan of Care (Signed)
  Problem: Education: Goal: Knowledge of disease or condition will improve Outcome: Progressing   Problem: Ischemic Stroke/TIA Tissue Perfusion: Goal: Complications of ischemic stroke/TIA will be minimized Outcome: Progressing   Problem: Coping: Goal: Will verbalize positive feelings about self Outcome: Progressing Goal: Will identify appropriate support needs Outcome: Progressing   Problem: Health Behavior/Discharge Planning: Goal: Ability to manage health-related needs will improve Outcome: Progressing   Problem: Self-Care: Goal: Ability to participate in self-care as condition permits will improve Outcome: Progressing Goal: Ability to communicate needs accurately will improve Outcome: Progressing

## 2024-06-29 NOTE — Plan of Care (Signed)
  Problem: Education: Goal: Knowledge of disease or condition will improve 06/29/2024 0346 by Jerrye Lacinda HERO, RN Outcome: Progressing 06/29/2024 0251 by Jerrye Lacinda HERO, RN Outcome: Progressing Goal: Knowledge of secondary prevention will improve (MUST DOCUMENT ALL) Outcome: Progressing Goal: Knowledge of patient specific risk factors will improve (DELETE if not current risk factor) Outcome: Progressing   Problem: Ischemic Stroke/TIA Tissue Perfusion: Goal: Complications of ischemic stroke/TIA will be minimized 06/29/2024 0346 by Jerrye Lacinda HERO, RN Outcome: Progressing 06/29/2024 0251 by Jerrye Lacinda HERO, RN Outcome: Progressing   Problem: Coping: Goal: Will verbalize positive feelings about self Outcome: Progressing Goal: Will identify appropriate support needs Outcome: Progressing   Problem: Health Behavior/Discharge Planning: Goal: Ability to manage health-related needs will improve Outcome: Progressing   Problem: Self-Care: Goal: Ability to participate in self-care as condition permits will improve Outcome: Progressing Goal: Ability to communicate needs accurately will improve Outcome: Progressing

## 2024-06-29 NOTE — Progress Notes (Signed)
 IV removed and discharge education complete. Patients daughter picked up patient and walked with patient off unit.

## 2024-07-09 NOTE — Telephone Encounter (Signed)
 Last Prolia  inj 06/23/24 Next Prolia  inj due 12/22/24

## 2024-07-14 ENCOUNTER — Ambulatory Visit: Admitting: Family Medicine

## 2024-07-14 ENCOUNTER — Encounter: Payer: Self-pay | Admitting: Family Medicine

## 2024-07-14 VITALS — BP 132/74 | HR 59 | Ht 63.0 in | Wt 128.9 lb

## 2024-07-14 DIAGNOSIS — R202 Paresthesia of skin: Secondary | ICD-10-CM

## 2024-07-14 DIAGNOSIS — R600 Localized edema: Secondary | ICD-10-CM

## 2024-07-14 DIAGNOSIS — I773 Arterial fibromuscular dysplasia: Secondary | ICD-10-CM | POA: Insufficient documentation

## 2024-07-14 NOTE — Progress Notes (Signed)
 Established Patient Office Visit  Subjective   Patient ID: Kaitlyn Parks, female    DOB: Oct 02, 1934  Age: 89 y.o. MRN: [id number]  Chief Complaint  Patient presents with   Hospitalization Follow-up    Pt states my glucose was elevated, it was up to 250 but my A1C was normal I'm also worried about a kidney issue, my ankles have been swollen for about a month. I also have tingling in my arms and legs but I do have an appointment with neurology in January.    HPI  Discussed the use of AI scribe software for clinical note transcription with the patient, who gave verbal consent to proceed.  History of Present Illness   Kaitlyn Parks is an 89 year old female who presents with tingling in her right arm and high glucose levels.  She describes tingling in her right arm that began around 3:30 AM and has been gradually worsening over several months. Changing posture and avoiding sleeping on her right side provide only partial relief. She has a neurology visit scheduled on January 23rd for further evaluation.  She reports previously noted high glucose during a recent emergency room visit, though she recalls a normal A1c and is unsure if she was fasting at the time of the test.  She has swelling in both ankles that began after hip replacement surgery on the 26th, with a sensation of a tight band around the ankles. She remains active with water  exercise and a stationary bike. She reports no recent medication changes.  She notes a metallic smell to her urine and occasional urgency. She took Azo last night for these symptoms and thinks they may be related to vitamins and acidic foods and drinks.  She had pancreatitis on December 23rd and has since reduced protein intake. She mainly eats chicken and salmon and recently added protein powder to increase protein intake.  She monitors blood pressure at home and notes it is often high at night. She takes hydralazine  when her blood pressure reaches 159  mmHg. She reports no recent changes in her blood pressure medications.      Current Outpatient Medications  Medication Instructions   Advil 200-400 mg, 2 times daily PRN   Aflibercept  (EYLEA  IZ) See admin instructions   amLODipine  (NORVASC ) 2.5 mg, Oral, Daily   Ascorbic Acid (VITAMIN C PO) 1 tablet, Every other day   aspirin  EC 81 mg, Daily at bedtime   Brimonidine  Tartrate (LUMIFY ) 0.025 % SOLN 1 drop, Every 4 hours PRN   CALCIUM  PO 1 tablet, Every other day   Claritin 10 mg, Daily PRN   Cranberry 400 mg, Every morning   Cyanocobalamin  (VITAMIN B12 PO) 1 tablet, Every other day   FERROUS SULFATE PO 1 tablet, Every other day   hydrALAZINE  (APRESOLINE ) 10 MG tablet TAKE (1) TABLET TWICE A DAY. MAY TAKE EXTRA DOSE FOR SBP >160 UP TO A TOTAL OF 4 TIMES A DAY AS NEEDED   isosorbide  mononitrate (IMDUR ) 60 MG 24 hr tablet TAKE 1 & 1/2 TABLETS by mouth once A DAY.   Lidocaine -Menthol  3.5-7 % PTCH 1 patch, 2 times daily PRN   Multiple Vitamins-Calcium  (ONE-A-DAY WOMENS PO) 1 tablet, Daily with lunch   nitroGLYCERIN  (NITROSTAT ) 0.4 MG SL tablet DISSOLVE 1 TABLET UNDER TONGUE IF NEEDED FOR CHEST PAIN. MAY REPEAT IN 5 MINUTES FOR 3 DOSES.   NON FORMULARY Unnamed steroid injection for SI joint every 12 wks with Dr. Darlis @ [facility]   OCEAN NASAL SPRAY  0.65 % nasal spray 1 spray, See admin instructions   ondansetron  (ZOFRAN -ODT) 4 mg, Every 8 hours PRN   pantoprazole  (PROTONIX ) 40 mg, Oral, Daily   rosuvastatin  (CRESTOR ) 10 MG tablet TABLET ONE-HALF TABLET BY MOUTH DAILY   SYNTHROID  50 MCG tablet TAKE 1 TABLET EACH DAY. NEED APPOINTMENT FOR ADDITIONAL REFILLS   tiZANidine  (ZANAFLEX ) 4 mg, Oral, Every 6 hours PRN   traMADol  (ULTRAM ) 50 mg, Every 8 hours PRN    Patient Active Problem List   Diagnosis Date Noted   Fibromuscular dysplasia of carotid artery 07/14/2024   TIA (transient ischemic attack) 06/28/2024   Normocytic anemia 04/06/2024   Vomiting and diarrhea 02/08/2024    Primary osteoarthritis of left hip 01/27/2024   S/P total left hip arthroplasty 01/27/2024   Pancreatitis, acute 06/14/2022   Abdominal pain 06/13/2022   Acute pancreatitis 06/13/2022   Hyponatremia 06/13/2022   Gastritis 06/11/2022   Aortic atherosclerosis 12/07/2019   Macular degeneration 01/11/2019   Coronary artery disease involving native coronary artery of native heart without angina pectoris 10/29/2018   Presence of intraocular lens 09/23/2018   Bradycardia 11/11/2017   Dyslipidemia 11/11/2017   CAD (coronary artery disease) 06/12/2017   GERD (gastroesophageal reflux disease) 06/12/2017   S/P hip replacement, right 06/12/2017   Osteoarthritis of right hip 06/10/2017   CAD S/P percutaneous coronary angioplasty 02/11/2015   Renal insufficiency 02/11/2015   Prolonged Q-T interval on ECG 04/18/2010   Hypertension 08/11/2007   Hypothyroidism (acquired) 06/08/2007   MITRAL VALVE PROLAPSE 06/08/2007     Review of Systems  All other systems reviewed and are negative.     Objective:     BP 132/74 (BP Location: Left Arm, Patient Position: Sitting, Cuff Size: Normal)   Pulse (!) 59   Ht 5' 3 (1.6 m)   Wt 128 lb 14.4 oz (58.5 kg)   SpO2 98%   BMI 22.83 kg/m    Physical Exam Vitals reviewed.  Constitutional:      Appearance: Normal appearance. She is normal weight.  Eyes:     Conjunctiva/sclera: Conjunctivae normal.  Cardiovascular:     Rate and Rhythm: Normal rate and regular rhythm.     Heart sounds: Normal heart sounds.  Pulmonary:     Effort: Pulmonary effort is normal.     Breath sounds: Normal breath sounds. No wheezing, rhonchi or rales.  Musculoskeletal:        General: No swelling, tenderness or signs of injury.  Neurological:     Mental Status: She is alert and oriented to person, place, and time. Mental status is at baseline.  Psychiatric:        Mood and Affect: Mood normal.        Behavior: Behavior normal.      No results found for any visits  on 07/14/24.    The ASCVD Risk score (Arnett DK, et al., 2019) failed to calculate for the following reasons:   The 2019 ASCVD risk score is only valid for ages 28 to 58   Risk score cannot be calculated because patient has a medical history suggesting prior/existing ASCVD   * - Cholesterol units were assumed    Assessment & Plan:  Arm paresthesia, right  Peripheral edema  Fibromuscular dysplasia of carotid artery   Assessment and Plan    Cervical radiculopathy Moderate to severe nerve impingement at C6-C7 on MRI, likely causing right arm tingling. Symptoms have been gradually worsening over the past couple of months. - Attend neurology appointment on January  23rd for further evaluation and management.  Peripheral edema Bilateral ankle swelling, more pronounced post-hip replacement. Normal kidney and heart function. Mild tricuspid and mitral valve regurgitation noted on echocardiogram. Possible contribution from low protein levels and medication side effects (amlodipine  and hydralazine ). - Continue wearing compression stockings. - Increase dietary protein intake to improve protein levels and reduce edema.  Hypertension Blood pressure well-controlled at 132/74 mmHg. Home monitoring shows occasional elevations, managed with hydralazine  as needed. Current medications include amlodipine  and hydralazine , which may contribute to ankle swelling. - Continue current antihypertensive regimen. - Monitor blood pressure at home and use hydralazine  as needed for elevated readings.  Hypoproteinemia Low protein levels contributing to peripheral edema. Dietary intake of protein is insufficient, possibly due to previous pancreatitis and dietary restrictions. - Increase dietary protein intake using protein powder or protein shakes.  Fibromuscular dysplasia of carotid artery Mild fibromuscular dysplasia noted on CT angiography. No significant stenosis or blockages. Condition is not currently  causing significant symptoms or requiring intervention.  I personally spent a total of 30 minutes in the care of the patient today including getting/reviewing separately obtained history, performing a medically appropriate exam/evaluation, documenting clinical information in the EHR, independently interpreting results, and communicating results.    Return in about 3 months (around 10/12/2024) for please reschedule january appt to march.    Heron [name] Sharper, MD

## 2024-07-23 ENCOUNTER — Other Ambulatory Visit: Payer: Self-pay | Admitting: Family Medicine

## 2024-07-23 DIAGNOSIS — E039 Hypothyroidism, unspecified: Secondary | ICD-10-CM

## 2024-07-24 ENCOUNTER — Encounter: Payer: Self-pay | Admitting: Family Medicine

## 2024-07-25 ENCOUNTER — Encounter: Payer: Self-pay | Admitting: Family Medicine

## 2024-07-25 ENCOUNTER — Ambulatory Visit: Admitting: Family Medicine

## 2024-07-25 VITALS — BP 98/50 | HR 75 | Temp 97.5°F | Ht 63.0 in | Wt 127.7 lb

## 2024-07-25 DIAGNOSIS — R197 Diarrhea, unspecified: Secondary | ICD-10-CM

## 2024-07-25 NOTE — Progress Notes (Signed)
 "  Established Patient Office Visit  Subjective   Patient ID: Kaitlyn Parks, female    DOB: 1934/10/23  Age: 89 y.o. MRN: [id number]  Chief Complaint  Patient presents with   Diarrhea    X1 week, used Imodium yesterday with no relief   Abdominal Pain    X1 week    Diarrhea  Associated symptoms include abdominal pain.  Abdominal Pain Associated symptoms include diarrhea.  Discussed the use of AI scribe software for clinical note transcription with the patient, who gave verbal consent to proceed.  History of Present Illness   Kaitlyn Parks is an 89 year old female who presents with loose bowel movements and urgency.  She has had loose stools since Sunday night after eating at a buffet. She has a vague abdominal ache with the diarrhea.  She has had urgency with two episodes of incontinence while out of the house. She has tried dietary adjustment and Imodium, including up to three doses in one day, and one dose of Pepto-Bismol, with only partial relief. She is worried about controlling symptoms during a planned two-hour car ride on Christmas Day.  She has 2 to 3 loose bowel movements per day with urgency, without nausea, vomiting, fever, chills, or blood in the stool. She feels she is maintaining weight and hydration and is tolerating oral intake.  She reports a prior enterotoxigenic E. coli infection after buffet food in 2020 that required stool testing and treatment, and she is concerned this episode may be similar.        Current Outpatient Medications  Medication Instructions   Advil 200-400 mg, 2 times daily PRN   Aflibercept  (EYLEA  IZ) See admin instructions   amLODipine  (NORVASC ) 2.5 mg, Oral, Daily   Ascorbic Acid (VITAMIN C PO) 1 tablet, Every other day   aspirin  EC 81 mg, Daily at bedtime   Brimonidine  Tartrate (LUMIFY ) 0.025 % SOLN 1 drop, Every 4 hours PRN   CALCIUM  PO 1 tablet, Every other day   Claritin 10 mg, Daily PRN   Cranberry 400 mg, Every morning    Cyanocobalamin  (VITAMIN B12 PO) 1 tablet, Every other day   FERROUS SULFATE PO 1 tablet, Every other day   hydrALAZINE  (APRESOLINE ) 10 MG tablet TAKE (1) TABLET TWICE A DAY. MAY TAKE EXTRA DOSE FOR SBP >160 UP TO A TOTAL OF 4 TIMES A DAY AS NEEDED   isosorbide  mononitrate (IMDUR ) 60 MG 24 hr tablet TAKE 1 & 1/2 TABLETS by mouth once A DAY.   Lidocaine -Menthol  3.5-7 % PTCH 1 patch, 2 times daily PRN   Multiple Vitamins-Calcium  (ONE-A-DAY WOMENS PO) 1 tablet, Daily with lunch   nitroGLYCERIN  (NITROSTAT ) 0.4 MG SL tablet DISSOLVE 1 TABLET UNDER TONGUE IF NEEDED FOR CHEST PAIN. MAY REPEAT IN 5 MINUTES FOR 3 DOSES.   NON FORMULARY Unnamed steroid injection for SI joint every 12 wks with Dr. Darlis @ [facility]   OCEAN NASAL SPRAY 0.65 % nasal spray 1 spray, See admin instructions   ondansetron  (ZOFRAN -ODT) 4 mg, Every 8 hours PRN   pantoprazole  (PROTONIX ) 40 mg, Oral, Daily   rosuvastatin  (CRESTOR ) 10 MG tablet TABLET ONE-HALF TABLET BY MOUTH DAILY   Synthroid  50 mcg, Oral, Daily before breakfast   tiZANidine  (ZANAFLEX ) 4 mg, Oral, Every 6 hours PRN   traMADol  (ULTRAM ) 50 mg, Every 8 hours PRN    Patient Active Problem List   Diagnosis Date Noted   Fibromuscular dysplasia of carotid artery 07/14/2024   TIA (transient ischemic  attack) 06/28/2024   Normocytic anemia 04/06/2024   Vomiting and diarrhea 02/08/2024   Primary osteoarthritis of left hip 01/27/2024   S/P total left hip arthroplasty 01/27/2024   Pancreatitis, acute 06/14/2022   Abdominal pain 06/13/2022   Acute pancreatitis 06/13/2022   Hyponatremia 06/13/2022   Gastritis 06/11/2022   Aortic atherosclerosis 12/07/2019   Macular degeneration 01/11/2019   Coronary artery disease involving native coronary artery of native heart without angina pectoris 10/29/2018   Presence of intraocular lens 09/23/2018   Bradycardia 11/11/2017   Dyslipidemia 11/11/2017   CAD (coronary artery disease) 06/12/2017   GERD  (gastroesophageal reflux disease) 06/12/2017   S/P hip replacement, right 06/12/2017   Osteoarthritis of right hip 06/10/2017   CAD S/P percutaneous coronary angioplasty 02/11/2015   Renal insufficiency 02/11/2015   Prolonged Q-T interval on ECG 04/18/2010   Hypertension 08/11/2007   Hypothyroidism (acquired) 06/08/2007   MITRAL VALVE PROLAPSE 06/08/2007     Review of Systems  Gastrointestinal:  Positive for abdominal pain and diarrhea.  All other systems reviewed and are negative.     Objective:     BP (!) 98/50   Pulse 75   Temp (!) 97.5 F (36.4 C) (Oral)   Ht 5' 3 (1.6 m)   Wt 127 lb 11.2 oz (57.9 kg)   SpO2 99%   BMI 22.62 kg/m    Physical Exam Vitals reviewed.  Constitutional:      Appearance: She is well-developed and normal weight.  Cardiovascular:     Rate and Rhythm: Normal rate and regular rhythm.     Heart sounds: No murmur heard. Pulmonary:     Effort: Pulmonary effort is normal.     Breath sounds: Normal breath sounds. No wheezing.  Abdominal:     General: Abdomen is flat. Bowel sounds are normal. There is no distension.     Palpations: Abdomen is soft.     Tenderness: There is no abdominal tenderness.  Neurological:     Mental Status: She is alert.      No results found for any visits on 07/25/24.    The ASCVD Risk score (Arnett DK, et al., 2019) failed to calculate for the following reasons:   The 2019 ASCVD risk score is only valid for ages 70 to 67   Risk score cannot be calculated because patient has a medical history suggesting prior/existing ASCVD   * - Cholesterol units were assumed    Assessment & Plan:  Acute diarrhea   Assessment and Plan    Acute diarrhea Loose bowel movements and urgency since Sunday night, likely due to dietary indiscretion at a buffet. No nausea, vomiting, fever, chills, or blood in stool. Weight is stable, and hydration is adequate. Previous episode of E. coli infection three years ago. No current signs  of bacterial infection or need for antibiotics. - Continue loperamide (Imodium) up to 8 tablets in 24 hours, one after each loose stool. - Add Pepto-Bismol as needed, especially before travel. - Maintain hydration and follow a diet of boiled starches, salt, crackers, bananas, potatoes, noodles, rice, wheat, and oats. - Monitor for worsening symptoms such as blood in stool, fever, or persistent diarrhea. - If symptoms persist or worsen, contact provider for stool culture to rule out bacterial infection.        No follow-ups on file.    Heron [name] Sharper, MD "

## 2024-07-25 NOTE — Patient Instructions (Addendum)
 Loperamide  (Imodium) -- up to 8 tablets in 24 hours  Can also add pepto bismol to the imodium tablets.

## 2024-08-16 ENCOUNTER — Inpatient Hospital Stay: Admitting: Oncology

## 2024-08-16 ENCOUNTER — Inpatient Hospital Stay: Attending: Oncology

## 2024-08-16 VITALS — BP 136/77 | HR 63 | Temp 98.0°F | Resp 19 | Wt 130.7 lb

## 2024-08-16 DIAGNOSIS — Z881 Allergy status to other antibiotic agents status: Secondary | ICD-10-CM | POA: Diagnosis not present

## 2024-08-16 DIAGNOSIS — M810 Age-related osteoporosis without current pathological fracture: Secondary | ICD-10-CM | POA: Insufficient documentation

## 2024-08-16 DIAGNOSIS — Z8719 Personal history of other diseases of the digestive system: Secondary | ICD-10-CM | POA: Insufficient documentation

## 2024-08-16 DIAGNOSIS — D649 Anemia, unspecified: Secondary | ICD-10-CM | POA: Diagnosis not present

## 2024-08-16 DIAGNOSIS — M4802 Spinal stenosis, cervical region: Secondary | ICD-10-CM | POA: Diagnosis not present

## 2024-08-16 DIAGNOSIS — E611 Iron deficiency: Secondary | ICD-10-CM | POA: Insufficient documentation

## 2024-08-16 DIAGNOSIS — M4312 Spondylolisthesis, cervical region: Secondary | ICD-10-CM | POA: Diagnosis not present

## 2024-08-16 DIAGNOSIS — E538 Deficiency of other specified B group vitamins: Secondary | ICD-10-CM | POA: Insufficient documentation

## 2024-08-16 DIAGNOSIS — I252 Old myocardial infarction: Secondary | ICD-10-CM | POA: Diagnosis not present

## 2024-08-16 DIAGNOSIS — Z79890 Hormone replacement therapy (postmenopausal): Secondary | ICD-10-CM | POA: Diagnosis not present

## 2024-08-16 DIAGNOSIS — Z888 Allergy status to other drugs, medicaments and biological substances status: Secondary | ICD-10-CM | POA: Insufficient documentation

## 2024-08-16 DIAGNOSIS — Z882 Allergy status to sulfonamides status: Secondary | ICD-10-CM | POA: Insufficient documentation

## 2024-08-16 DIAGNOSIS — M4722 Other spondylosis with radiculopathy, cervical region: Secondary | ICD-10-CM | POA: Insufficient documentation

## 2024-08-16 DIAGNOSIS — Z7982 Long term (current) use of aspirin: Secondary | ICD-10-CM | POA: Insufficient documentation

## 2024-08-16 DIAGNOSIS — Z79899 Other long term (current) drug therapy: Secondary | ICD-10-CM | POA: Insufficient documentation

## 2024-08-16 DIAGNOSIS — I2510 Atherosclerotic heart disease of native coronary artery without angina pectoris: Secondary | ICD-10-CM | POA: Insufficient documentation

## 2024-08-16 DIAGNOSIS — M50122 Cervical disc disorder at C5-C6 level with radiculopathy: Secondary | ICD-10-CM | POA: Insufficient documentation

## 2024-08-16 DIAGNOSIS — G459 Transient cerebral ischemic attack, unspecified: Secondary | ICD-10-CM | POA: Diagnosis not present

## 2024-08-16 DIAGNOSIS — I081 Rheumatic disorders of both mitral and tricuspid valves: Secondary | ICD-10-CM | POA: Diagnosis not present

## 2024-08-16 DIAGNOSIS — I10 Essential (primary) hypertension: Secondary | ICD-10-CM | POA: Insufficient documentation

## 2024-08-16 DIAGNOSIS — E039 Hypothyroidism, unspecified: Secondary | ICD-10-CM | POA: Insufficient documentation

## 2024-08-16 LAB — CBC WITH DIFFERENTIAL (CANCER CENTER ONLY)
Abs Immature Granulocytes: 0.01 K/uL (ref 0.00–0.07)
Basophils Absolute: 0 K/uL (ref 0.0–0.1)
Basophils Relative: 1 %
Eosinophils Absolute: 0.1 K/uL (ref 0.0–0.5)
Eosinophils Relative: 2 %
HCT: 39.2 % (ref 36.0–46.0)
Hemoglobin: 12.9 g/dL (ref 12.0–15.0)
Immature Granulocytes: 0 %
Lymphocytes Relative: 21 %
Lymphs Abs: 1 K/uL (ref 0.7–4.0)
MCH: 30.4 pg (ref 26.0–34.0)
MCHC: 32.9 g/dL (ref 30.0–36.0)
MCV: 92.2 fL (ref 80.0–100.0)
Monocytes Absolute: 0.5 K/uL (ref 0.1–1.0)
Monocytes Relative: 10 %
Neutro Abs: 3 K/uL (ref 1.7–7.7)
Neutrophils Relative %: 66 %
Platelet Count: 231 K/uL (ref 150–400)
RBC: 4.25 MIL/uL (ref 3.87–5.11)
RDW: 12.9 % (ref 11.5–15.5)
WBC Count: 4.6 K/uL (ref 4.0–10.5)
nRBC: 0 % (ref 0.0–0.2)

## 2024-08-16 LAB — FERRITIN: Ferritin: 71 ng/mL (ref 11–307)

## 2024-08-16 LAB — IRON AND IRON BINDING CAPACITY (CC-WL,HP ONLY)
Iron: 49 ug/dL (ref 28–170)
Saturation Ratios: 16 % (ref 10.4–31.8)
TIBC: 309 ug/dL (ref 250–450)
UIBC: 261 ug/dL

## 2024-08-16 LAB — VITAMIN B12: Vitamin B-12: 236 pg/mL (ref 180–914)

## 2024-08-16 NOTE — Assessment & Plan Note (Addendum)
 Anemia likely secondary to iron and vitamin B12 deficiency, possibly exacerbated by dietary restrictions and recent hip surgery.   Hemoglobin was 11.6 in July 2025, slightly below normal. No significant side effects from oral iron supplementation.  B12 absorption issues are a consideration, but currently managed with oral supplementation.   No evidence of bone marrow pathology as other blood counts are normal.  On her consultation with us  on 04/06/2024, labs actually showed improved hemoglobin of 12.4, MCV normal at 91.  White count, platelet count were within normal limits.  Iron studies, B12, folic acid , LDH, haptoglobin, methylmalonic acid were all within normal limits.  She was advised to continue oral iron and B12 supplements.  She was advised to take vitamin C along with iron to enhance absorption.  Labs today showed essentially unremarkable CBCD with normal hemoglobin of 12.9, MCV normal at 92.  White count and platelet count are normal.  Iron studies show no evidence of iron deficiency.  Vitamin B12 is normal at 236.  She was advised to continue current management.  Since no additional hematological workup or intervention is needed, patient can be discharged from our office for continued follow-up with her PCP and other subspecialists.  Please reach out to us  with any questions or concerns.  Please reconsult us  as necessary.  Thank you for giving us  an opportunity to participate in her care.

## 2024-08-16 NOTE — Progress Notes (Signed)
 "  [facility]  HEMATOLOGY CLINIC PROGRESS NOTE  PATIENT NAME: Kaitlyn Parks   MR#: [id number] DOB: November 15, 1934  Patient Care Team: [name] [name], MD as PCP - General (Family Medicine) Lavona Agent, MD as PCP - Cardiology (Cardiology) Trixie File, MD as Consulting Physician (Internal Medicine) Mavis Purchase, MD as Consulting Physician (Neurosurgery) Watt Rush, MD as Attending Physician (Urology) Duke, Jon Garre, [place] as Physician Assistant (Cardiology) Craig Alan JONELLE DEVONNA (Gastroenterology)  Date of visit: 08/16/2024   ASSESSMENT & PLAN:   Kaitlyn Parks is a 89 y.o. lady with a past medical history of CAD status post PCI in 2012, history of pancreatitis, hypertension, hypothyroidism, macular degeneration, osteoporosis, GERD, was referred to our service in September 2025 for evaluation of normocytic anemia. Workup showed B12 deficiency and mild iron deficiency.   Normocytic anemia Anemia likely secondary to iron and vitamin B12 deficiency, possibly exacerbated by dietary restrictions and recent hip surgery.   Hemoglobin was 11.6 in July 2025, slightly below normal. No significant side effects from oral iron supplementation.  B12 absorption issues are a consideration, but currently managed with oral supplementation.   No evidence of bone marrow pathology as other blood counts are normal.  On her consultation with us  on 04/06/2024, labs actually showed improved hemoglobin of 12.4, MCV normal at 91.  White count, platelet count were within normal limits.  Iron studies, B12, folic acid , LDH, haptoglobin, methylmalonic acid were all within normal limits.  She was advised to continue oral iron and B12 supplements.  She was advised to take vitamin C along with iron to enhance absorption.  Labs today showed essentially unremarkable CBCD with normal hemoglobin of 12.9, MCV normal at 92.  White count and platelet count are normal.  Iron studies show no  evidence of iron deficiency.  Vitamin B12 is normal at 236.  She was advised to continue current management.  Since no additional hematological workup or intervention is needed, patient can be discharged from our office for continued follow-up with her PCP and other subspecialists.  Please reach out to us  with any questions or concerns.  Please reconsult us  as necessary.  Thank you for giving us  an opportunity to participate in her care.    I spent a total of 23 minutes during this encounter with the patient including review of chart and various tests results, discussions about plan of care and coordination of care plan.  I reviewed lab results and outside records for this visit and discussed relevant results with the patient. Diagnosis, plan of care and treatment options were also discussed in detail with the patient. Opportunity provided to ask questions and answers provided to her apparent satisfaction. Provided instructions to call our clinic with any problems, questions or concerns prior to return visit. I recommended to continue follow-up with PCP and sub-specialists. She verbalized understanding and agreed with the plan. No barriers to learning was detected.  Chinita Patten, MD  08/16/2024 12:19 PM  [facility] - A DEPT OF Kaitlyn Parks [address] [place] [address] Dept: [phone] Dept Fax: [phone]   CHIEF COMPLAINT/ REASON FOR VISIT:  Follow-up for history of normocytic anemia, related to mild iron deficiency and B12 deficiency.  INTERVAL HISTORY:  Discussed the use of AI scribe software for clinical note transcription with the patient, who gave verbal consent to proceed.  History of Present Illness Kaitlyn Parks is an 89 year old female with  normocytic anemia who presents for hematology follow-up to assess blood counts and ongoing management.  She is approximately four months since her last  hematology visit and has been adherent to iron and B12 supplementation as previously recommended. Since initiation of these supplements, her blood counts have normalized, with hemoglobin improving to 12.9. She denies symptoms of anemia and has no new complaints related to her hematologic status.  Her current regimen includes iron every other day, B12 supplementation, a multivitamin most days, daily eye vitamins, and calcium  on alternate days. She expresses concern about the potential impact of multiple vitamins on her renal function, particularly given her age, and notes a metallic odor to her urine, which she wonders may be related to her vitamin intake. She otherwise feels well and maintains a healthy diet, emphasizing fruit intake.  She is followed regularly by her primary care provider, with blood work performed approximately every six months.   SUMMARY OF HEMATOLOGY HISTORY:    She was referred by her gastroenterology doctors for anemia issues.   Labs at her gastroenterologist office on 02/23/2024 showed anemia with hemoglobin of 11.6, MCV 90.8.  White count 5800 with normal differential.  Platelet count 410,000.  Iron studies showed decreased iron of 30, iron saturation decreased at 9%.  Ferritin was 92.  She was referred to us  for normocytic anemia.   EGD on 03/03/2024 showed diffusely atrophic mucosa consistent with atrophic gastritis associated with B12 deficiency.   Review of records indicate that she has been having fluctuating anemia since June 2025.   Her last colonoscopy was in September 2007 which was unremarkable.   She has experienced lightheadedness since June 2025, coinciding with her hip replacement surgery on January 27, 2024. She recalls being told that her anemia could be related to blood loss during her hip replacement surgery. She continues to experience warmth in her thigh, which she attributes to nerve endings, and she ices the area regularly. Her physical activity has  decreased since the surgery, but she is gradually resuming exercises, including water  exercises and stationary biking.   In April 2025, she visited her internal medicine doctor due to stomach issues, which she believes were caused by excessive use of Advil and Aleve for hip pain. She experienced burning in her stomach and was prescribed Protonix , which she has been taking since then.   She has a history of pancreatitis in December 2023 and avoids red meat, consuming only chicken and fish. Her B12 levels were found to be low in July 2025; she does not eat red meat and consumes only chicken and fish. She is currently taking oral B12 supplements and over-the-counter iron supplements once daily without any major side effects. No constipation, heartburn, or nausea from the iron supplements.   Her hemoglobin was recorded at 11.6, slightly below the normal range of 12. She is trying to improve her diet by eating more green vegetables, such as broccoli, and her daughter assists by preparing green dishes. Her other daughter consumes meat, and occasionally she eats leftover beef from her daughter's meals.   On her consultation with us  on 04/06/2024, labs actually showed improved hemoglobin of 12.4, MCV normal at 91.  White count, platelet count were within normal limits.  Iron studies, B12, folic acid , LDH, haptoglobin, methylmalonic acid were all within normal limits.   She was advised to continue oral iron and B12 supplements.  She was advised to take vitamin C along with iron to enhance absorption.  I have reviewed the past medical  history, past surgical history, social history and family history with the patient and they are unchanged from previous note.  ALLERGIES: She is allergic to acyclovir and related, ciprofloxacin , pravachol  [pravastatin  sodium], sulfa  antibiotics, and zocor  [simvastatin ].  MEDICATIONS:  Current Outpatient Medications  Medication Sig Dispense Refill   ADVIL 200 MG CAPS Take 200-400  mg by mouth 2 (two) times daily as needed (for mild pain or muscle soreness).     Aflibercept  (EYLEA  IZ) by Intravitreal route See admin instructions. Every 12 weeks     amLODipine  (NORVASC ) 2.5 MG tablet Take 1 tablet (2.5 mg total) by mouth daily. (Patient taking differently: Take 2.5 mg by mouth daily in the afternoon.) 90 tablet 3   Ascorbic Acid (VITAMIN C PO) Take 1 tablet by mouth every other day.     aspirin  EC 81 MG tablet Take 81 mg by mouth at bedtime. Swallow whole.     Brimonidine  Tartrate (LUMIFY ) 0.025 % SOLN Place 1 drop into both eyes every 4 (four) hours as needed (dryness).     CALCIUM  PO Take 1 tablet by mouth every other day.     CLARITIN 10 MG tablet Take 10 mg by mouth daily as needed (for seasonal allergies).     Cranberry 400 MG CAPS Take 400 mg by mouth in the morning.     Cyanocobalamin  (VITAMIN B12 PO) Take 1 tablet by mouth every other day.     FERROUS SULFATE PO Take 1 tablet by mouth every other day.     hydrALAZINE  (APRESOLINE ) 10 MG tablet TAKE (1) TABLET TWICE A DAY. MAY TAKE EXTRA DOSE FOR SBP >160 UP TO A TOTAL OF 4 TIMES A DAY AS NEEDED (Patient taking differently: Take 10 mg by mouth 4 (four) times daily as needed (for a Systolic number greater than 160).) 360 tablet 3   isosorbide  mononitrate (IMDUR ) 60 MG 24 hr tablet TAKE 1 & 1/2 TABLETS by mouth once A DAY. (Patient taking differently: Take 90 mg by mouth at bedtime.) 135 tablet 1   Lidocaine -Menthol  3.5-7 % PTCH Place 1 patch onto the skin 2 (two) times daily as needed (knee/hip pain.).     Multiple Vitamins-Calcium  (ONE-A-DAY WOMENS PO) Take 1 tablet by mouth daily with lunch.     nitroGLYCERIN  (NITROSTAT ) 0.4 MG SL tablet DISSOLVE 1 TABLET UNDER TONGUE IF NEEDED FOR CHEST PAIN. MAY REPEAT IN 5 MINUTES FOR 3 DOSES. (Patient taking differently: Place 0.4 mg under the tongue every 5 (five) minutes x 3 doses as needed for chest pain.) 25 tablet 11   OCEAN NASAL SPRAY 0.65 % nasal spray Place 1 spray into the  nose See admin instructions. Instill 1 spray into each nostil in the morning and as needed for nasal congestion     ondansetron  (ZOFRAN -ODT) 4 MG disintegrating tablet Take 4 mg by mouth every 8 (eight) hours as needed for nausea or vomiting (dissolve orally).     pantoprazole  (PROTONIX ) 40 MG tablet Take 1 tablet (40 mg total) by mouth daily. 30 tablet 1   rosuvastatin  (CRESTOR ) 10 MG tablet TABLET ONE-HALF TABLET BY MOUTH DAILY (Patient taking differently: Take 5 mg by mouth every evening.) 45 tablet 3   SYNTHROID  50 MCG tablet Take 1 tablet (50 mcg total) by mouth daily before breakfast. 90 tablet 1   tiZANidine  (ZANAFLEX ) 4 MG tablet Take 1 tablet (4 mg total) by mouth every 6 (six) hours as needed for muscle spasms. 30 tablet 0   traMADol  (ULTRAM ) 50 MG tablet Take 50  mg by mouth every 8 (eight) hours as needed for moderate pain (pain score 4-6).     Current Facility-Administered Medications  Medication Dose Route Frequency Provider Last Rate Last Admin   denosumab  (PROLIA ) injection 60 mg  60 mg Subcutaneous Once Thapa, [place], MD       [START ON 12/21/2024] denosumab  (PROLIA ) injection 60 mg  60 mg Subcutaneous Once Thapa, [place], MD         REVIEW OF SYSTEMS:    Review of Systems - Oncology  All other pertinent systems were reviewed with the patient and are negative.  PHYSICAL EXAMINATION:   Onc Performance Status - 08/16/24 1143       ECOG Perf Status   ECOG Perf Status Restricted in physically strenuous activity but ambulatory and able to carry out work of a light or sedentary nature, e.g., light house work, office work      KPS SCALE   KPS % SCORE Able to carry on normal activity, minor s/s of disease          Vitals:   08/16/24 1143  BP: 136/77  Pulse: 63  Resp: 19  Temp: 98 F (36.7 C)  SpO2: 98%   Filed Weights   08/16/24 1143  Weight: 130 lb 11.2 oz (59.3 kg)    Physical Exam Constitutional:      General: She is not in acute distress.    Appearance:  Normal appearance.  HENT:     Head: Normocephalic and atraumatic.  Cardiovascular:     Rate and Rhythm: Normal rate.  Pulmonary:     Effort: Pulmonary effort is normal. No respiratory distress.  Abdominal:     General: There is no distension.  Neurological:     General: No focal deficit present.     Mental Status: She is alert and oriented to person, place, and time.  Psychiatric:        Mood and Affect: Mood normal.        Behavior: Behavior normal.     LABORATORY DATA:   I have reviewed the data as listed.  Results for orders placed or performed in visit on 08/16/24  CBC with Differential (Cancer Center Only)  Result Value Ref Range   WBC Count 4.6 4.0 - 10.5 K/uL   RBC 4.25 3.87 - 5.11 MIL/uL   Hemoglobin 12.9 12.0 - 15.0 g/dL   HCT 60.7 63.9 - 53.9 %   MCV 92.2 80.0 - 100.0 fL   MCH 30.4 26.0 - 34.0 pg   MCHC 32.9 30.0 - 36.0 g/dL   RDW 87.0 88.4 - 84.4 %   Platelet Count 231 150 - 400 K/uL   nRBC 0.0 0.0 - 0.2 %   Neutrophils Relative % 66 %   Neutro Abs 3.0 1.7 - 7.7 K/uL   Lymphocytes Relative 21 %   Lymphs Abs 1.0 0.7 - 4.0 K/uL   Monocytes Relative 10 %   Monocytes Absolute 0.5 0.1 - 1.0 K/uL   Eosinophils Relative 2 %   Eosinophils Absolute 0.1 0.0 - 0.5 K/uL   Basophils Relative 1 %   Basophils Absolute 0.0 0.0 - 0.1 K/uL   Immature Granulocytes 0 %   Abs Immature Granulocytes 0.01 0.00 - 0.07 K/uL  Iron and Iron Binding Capacity ([facility] only)  Result Value Ref Range   Iron 49 28 - 170 ug/dL   TIBC 690 749 - 549 ug/dL   Saturation Ratios 16 10.4 - 31.8 %   UIBC 261 ug/dL  Ferritin  Result Value Ref Range   Ferritin 71 11 - 307 ng/mL  Vitamin B12  Result Value Ref Range   Vitamin B-12 236 180 - 914 pg/mL     RADIOGRAPHIC STUDIES:  I have personally reviewed the radiological images as listed and agree with the findings in the report.  ECHOCARDIOGRAM COMPLETE    ECHOCARDIOGRAM REPORT       Patient Name:   Kaitlyn Parks Date of Exam:  06/29/2024 Medical Rec #:  [id number]      Height:       63.0 in Accession #:    [id number]     Weight:       129.0 lb Date of Birth:  13-Jun-1935      BSA:          1.605 m Patient Age:    89 years       BP:           143/70 mmHg Patient Gender: F              HR:           59 bpm. Exam Location:  Inpatient  Procedure: 2D Echo, Cardiac Doppler and Color Doppler (Both Spectral and Color            Flow Doppler were utilized during procedure).  Indications:    TIA G45.9   History:        Patient has prior history of Echocardiogram examinations, most                 recent 10/27/2021. CAD and Previous Myocardial Infarction.   Sonographer:    Jayson Gaskins Referring Phys: [phone] NILDA [name] GHERGHE  IMPRESSIONS   1. Left ventricular ejection fraction, by estimation, is 60 to 65%. The left ventricle has normal function. The left ventricle has no regional wall motion abnormalities. Left ventricular diastolic parameters are indeterminate.  2. Right ventricular systolic function is normal. The right ventricular size is normal.  3. The mitral valve is normal in structure. Mild to moderate mitral valve regurgitation. No evidence of mitral stenosis.  4. Tricuspid valve regurgitation is mild to moderate.  5. The aortic valve is normal in structure. Aortic valve regurgitation is not visualized. No aortic stenosis is present.  6. The inferior vena cava is normal in size with greater than 50% respiratory variability, suggesting right atrial pressure of 3 mmHg.  FINDINGS  Left Ventricle: Left ventricular ejection fraction, by estimation, is 60 to 65%. The left ventricle has normal function. The left ventricle has no regional wall motion abnormalities. The left ventricular internal cavity size was normal in size. There is  no left ventricular hypertrophy. Left ventricular diastolic parameters are indeterminate.  Right Ventricle: The right ventricular size is normal. No increase in right ventricular wall  thickness. Right ventricular systolic function is normal.  Left Atrium: Left atrial size was normal in size.  Right Atrium: Right atrial size was normal in size.  Pericardium: There is no evidence of pericardial effusion.  Mitral Valve: The mitral valve is normal in structure. Mild to moderate mitral valve regurgitation. No evidence of mitral valve stenosis.  Tricuspid Valve: The tricuspid valve is normal in structure. Tricuspid valve regurgitation is mild to moderate. No evidence of tricuspid stenosis.  Aortic Valve: The aortic valve is normal in structure. Aortic valve regurgitation is not visualized. No aortic stenosis is present. Aortic valve mean gradient measures 2.0 mmHg. Aortic valve peak gradient measures 4.2 mmHg. Aortic valve area, by  VTI  measures 2.36 cm.  Pulmonic Valve: The pulmonic valve was normal in structure. Pulmonic valve regurgitation is not visualized. No evidence of pulmonic stenosis.  Aorta: The aortic root is normal in size and structure.  Venous: The inferior vena cava is normal in size with greater than 50% respiratory variability, suggesting right atrial pressure of 3 mmHg.  IAS/Shunts: No atrial level shunt detected by color flow Doppler.    LEFT VENTRICLE PLAX 2D LVIDd:         4.00 cm   Diastology LVIDs:         2.80 cm   LV e' medial:    4.13 cm/s LV PW:         1.00 cm   LV E/e' medial:  23.8 LV IVS:        0.90 cm   LV e' lateral:   5.98 cm/s LVOT diam:     1.90 cm   LV E/e' lateral: 16.5 LV SV:         53 LV SV Index:   33 LVOT Area:     2.84 cm    RIGHT VENTRICLE RV S prime:     13.30 cm/s TAPSE (M-mode): 2.1 cm  LEFT ATRIUM           Index        RIGHT ATRIUM           Index LA Vol (A2C): 51.0 ml 31.78 ml/m  RA Area:     14.00 cm LA Vol (A4C): 33.9 ml 21.13 ml/m  RA Volume:   33.10 ml  20.63 ml/m  AORTIC VALVE AV Area (Vmax):    2.21 cm AV Area (Vmean):   2.27 cm AV Area (VTI):     2.36 cm AV Vmax:           103.00 cm/s AV  Vmean:          74.000 cm/s AV VTI:            0.225 m AV Peak Grad:      4.2 mmHg AV Mean Grad:      2.0 mmHg LVOT Vmax:         80.30 cm/s LVOT Vmean:        59.300 cm/s LVOT VTI:          0.187 m LVOT/AV VTI ratio: 0.83  MITRAL VALVE                TRICUSPID VALVE MV Area (PHT): 3.73 cm     TR Peak grad:   26.8 mmHg MV E velocity: 98.50 cm/s   TR Vmax:        259.00 cm/s MV A velocity: 104.00 cm/s MV E/A ratio:  0.95         SHUNTS                             Systemic VTI:  0.19 m                             Systemic Diam: 1.90 cm  Kardie Tobb DO Electronically signed by Dub Huntsman DO Signature Date/Time: 06/29/2024/10:06:46 AM      Final   MR CERVICAL SPINE W WO CONTRAST EXAM: MRI CERVICAL SPINE WITH AND WITHOUT CONTRAST 06/29/2024 08:57:41 AM  TECHNIQUE: Multiplanar multisequence MRI of the cervical spine was performed without and with the administration of  6 mL (gadobutrol  (GADAVIST ) 1 MMOL/ML injection 6 mL GADOBUTROL  1 MMOL/ML IV SOLN) intravenous contrast.  COMPARISON: None available.  CLINICAL HISTORY: Cervical radiculopathy, no red flags.  FINDINGS:  BONES AND ALIGNMENT: Slight anterolisthesis at C4-C5. Normal vertebral body heights. Marrow signal is unremarkable. No abnormal enhancement.  SPINAL CORD: Normal spinal cord size. Normal spinal cord signal.  SOFT TISSUES: No paraspinal mass.  C2-C3: No significant disc herniation. No spinal canal stenosis or neural foraminal narrowing.  C3-C4: Disc space narrowing. The spinal canal and neural foramina are patent. The facet joints are fused.  C4-C5: Slight anterolisthesis. Bilateral uncovertebral joint hypertrophy and bilateral facet arthrosis, more pronounced on the right. Mild central spinal canal stenosis and moderate right neuroforaminal stenosis.  C5-C6: Degenerative disc bulging and bilateral uncovertebral joint hypertrophy, resulting in mild central spinal canal stenosis and moderate  bilateral neuroforaminal stenosis.  C6-C7: Broad-based disc bulge, which is eccentric to the right. Mild-to-moderate central spinal canal stenosis and moderate-to-severe right neural foraminal stenosis. There is likely impingement of the right C7 nerve in the neural foramen.  C7-T1: No significant disc herniation. No spinal canal stenosis or neural foraminal narrowing.  IMPRESSION: 1. C6-7: Broad-based disc bulge, eccentric to the right, resulting in mild-to-moderate central spinal canal stenosis and moderate-to-severe right neural foraminal stenosis with likely impingement of the right C7 nerve in the neural foramen. 2. C5-6: Degenerative disc bulging and bilateral uncovertebral joint hypertrophy resulting in mild central spinal canal stenosis and moderate bilateral neuroforaminal stenosis. 3. C4-5: Slight anterolisthesis, bilateral uncovertebral joint hypertrophy, and bilateral facet arthrosis (more pronounced on the right) resulting in mild central spinal canal stenosis and moderate right neuroforaminal stenosis. 4. C3-4: Disc space narrowing. Patent spinal canal and neural foramina. Fused facet joints.  Electronically signed by: Evalene Coho MD 06/29/2024 09:39 AM EST RP Workstation: [id number]    No orders of the defined types were placed in this encounter.    Future Appointments  Date Time Provider Department Center  08/25/2024  8:30 AM Margaret Eduard SAUNDERS, MD [facility] None  09/12/2024  1:15 PM Valdemar Rogue, MD [facility] None  10/20/2024  1:40 PM [name] HERO, MD [facility] [address]  11/02/2024  1:00 PM [facility] VISIT [facility] [address]  11/27/2024  1:20 PM Thapa, [place], MD [facility] None     This document was completed utilizing speech recognition software. Grammatical errors, random word insertions, pronoun errors, and incomplete sentences are an occasional consequence of this system due to software limitations, ambient noise, and hardware issues.  Any formal questions or concerns about the content, text or information contained within the body of this dictation should be directly addressed to the provider for clarification.  "

## 2024-08-21 ENCOUNTER — Ambulatory Visit: Admitting: Family Medicine

## 2024-08-22 ENCOUNTER — Ambulatory Visit: Admitting: Family Medicine

## 2024-08-25 ENCOUNTER — Ambulatory Visit: Admitting: Diagnostic Neuroimaging

## 2024-08-25 ENCOUNTER — Encounter: Payer: Self-pay | Admitting: Diagnostic Neuroimaging

## 2024-08-25 VITALS — BP 112/52 | HR 52 | Ht 63.0 in | Wt 130.0 lb

## 2024-08-25 DIAGNOSIS — R200 Anesthesia of skin: Secondary | ICD-10-CM | POA: Diagnosis not present

## 2024-08-25 NOTE — Progress Notes (Signed)
 "  [facility]  PATIENT: Kaitlyn Parks DOB: 03-02-1935  REFERRING CLINICIAN: Jillian Buttery, MD HISTORY FROM: patient  REASON FOR VISIT: new consult   HISTORICAL  CHIEF COMPLAINT:  Chief Complaint  Patient presents with   New Patient (Initial Visit)    Pt in 7 alone Pt here for TIA hospital f/u Pt state tingling in right hand     HISTORY OF PRESENT ILLNESS:   89 year old female here for evaluation of right arm tingling sensation.  June 2025 patient had left hip surgery.  Following this she started using a cane and walker with more pressure on her right arm.  In August 2025 she noticed some right hand and wrist swelling and numbness sensation.  She used a wrist brace and tried to adjust her cane to help symptoms.  Eventually they improved.  November 2025 patient woke up 1 day with significant numbness in her right arm and hand.  Symptoms resolved within a few minutes.  She ended up going to the hospital for evaluation.  TIA workup was completed.  No acute findings on MRI.  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  Since that time patient continues to have some very mild numbness sensation in her right hand digit 2.  She notices that if she sleeps on her right side this tends to worsen the symptoms.   REVIEW OF SYSTEMS: Full 14 system review of systems performed and negative with exception of: as per HPI.  ALLERGIES: Allergies[1]  HOME MEDICATIONS: Outpatient Medications Prior to Visit  Medication Sig Dispense Refill   ADVIL 200 MG CAPS Take 200-400 mg by mouth 2 (two) times daily as needed (for mild pain or muscle soreness).     Aflibercept  (EYLEA  IZ) by Intravitreal route See admin instructions. Every 12 weeks     amLODipine  (NORVASC ) 2.5 MG tablet Take 1 tablet (2.5 mg total) by mouth daily. (Patient taking differently: Take 2.5 mg by mouth daily in the afternoon.) 90 tablet 3   Ascorbic Acid (VITAMIN C PO) Take 1 tablet by mouth  every other day.     aspirin  EC 81 MG tablet Take 81 mg by mouth at bedtime. Swallow whole.     Brimonidine  Tartrate (LUMIFY ) 0.025 % SOLN Place 1 drop into both eyes every 4 (four) hours as needed (dryness).     CALCIUM  PO Take 1 tablet by mouth every other day.     CLARITIN 10 MG tablet Take 10 mg by mouth daily as needed (for seasonal allergies).     Cranberry 400 MG CAPS Take 400 mg by mouth in the morning.     Cyanocobalamin  (VITAMIN B12 PO) Take 1 tablet by mouth every other day.     FERROUS SULFATE PO Take 1 tablet by mouth every other day.     hydrALAZINE  (APRESOLINE ) 10 MG tablet TAKE (1) TABLET TWICE A DAY. MAY TAKE EXTRA DOSE FOR SBP >160 UP TO A TOTAL OF 4 TIMES A DAY AS NEEDED (Patient taking differently: Take 10 mg by mouth 4 (four) times daily as needed (for a Systolic number greater than 160).) 360 tablet 3   isosorbide  mononitrate (IMDUR ) 60 MG 24 hr tablet TAKE 1 & 1/2 TABLETS by mouth once A DAY. (Patient taking differently: Take 90 mg by mouth at bedtime.) 135 tablet 1   Lidocaine -Menthol  3.5-7 % PTCH Place 1 patch onto the skin 2 (two) times daily as needed (knee/hip pain.).     Multiple Vitamins-Calcium  (ONE-A-DAY WOMENS PO) Take 1 tablet by  mouth daily with lunch.     nitroGLYCERIN  (NITROSTAT ) 0.4 MG SL tablet DISSOLVE 1 TABLET UNDER TONGUE IF NEEDED FOR CHEST PAIN. MAY REPEAT IN 5 MINUTES FOR 3 DOSES. (Patient taking differently: Place 0.4 mg under the tongue every 5 (five) minutes x 3 doses as needed for chest pain.) 25 tablet 11   OCEAN NASAL SPRAY 0.65 % nasal spray Place 1 spray into the nose See admin instructions. Instill 1 spray into each nostil in the morning and as needed for nasal congestion     ondansetron  (ZOFRAN -ODT) 4 MG disintegrating tablet Take 4 mg by mouth every 8 (eight) hours as needed for nausea or vomiting (dissolve orally).     pantoprazole  (PROTONIX ) 40 MG tablet Take 1 tablet (40 mg total) by mouth daily. 30 tablet 1   rosuvastatin  (CRESTOR ) 10 MG  tablet TABLET ONE-HALF TABLET BY MOUTH DAILY (Patient taking differently: Take 5 mg by mouth every evening.) 45 tablet 3   SYNTHROID  50 MCG tablet Take 1 tablet (50 mcg total) by mouth daily before breakfast. 90 tablet 1   tiZANidine  (ZANAFLEX ) 4 MG tablet Take 1 tablet (4 mg total) by mouth every 6 (six) hours as needed for muscle spasms. 30 tablet 0   traMADol  (ULTRAM ) 50 MG tablet Take 50 mg by mouth every 8 (eight) hours as needed for moderate pain (pain score 4-6).     Facility-Administered Medications Prior to Visit  Medication Dose Route Frequency Provider Last Rate Last Admin   denosumab  (PROLIA ) injection 60 mg  60 mg Subcutaneous Once Thapa, Sudan, MD       NOREEN ON 12/21/2024] denosumab  (PROLIA ) injection 60 mg  60 mg Subcutaneous Once Thapa, Sudan, MD        PAST MEDICAL HISTORY: Past Medical History:  Diagnosis Date   Arthritis    DDD, scoliosis, sees Dr. Mavis for this, uses norco very rarely for pain   Arthritis    lumbar stenosis, gets steroid injections for [facility] Spine   Bradycardia 11/11/2017   CAD (coronary artery disease)    LAD stenting of a 90% lesion 2012   Cystocele    Educated about COVID-19 virus infection 12/06/2019   Elevated cholesterol    GERD (gastroesophageal reflux disease)    dx in work up 2016 for atypical CP at OSH   pt. denies   Heart murmur    Hypertensive retinopathy    OU   Hypothyroidism    Interstitial cystitis    sees Dr. Watt   Macular degeneration    OU   Neuromuscular disorder [facility])    neuropathy   NSTEMI (non-ST elevated myocardial infarction) (HCC)    Osteoporosis    Rectocele    S/P hip replacement, right 06/12/2017   Scoliosis    Thyroid  disease    Hypothyroid   Urinary incontinence    USI   Uterine prolapse     PAST SURGICAL HISTORY: Past Surgical History:  Procedure Laterality Date   BLADDER SUSPENSION  2011   CATARACT EXTRACTION Bilateral 2015   Dr. Cleatus   CORONARY ANGIOPLASTY WITH STENT PLACEMENT   04/21/2010   LAD 80%, RCA 30%, nl EF, s/p DES LAD   EYE SURGERY     LEFT HEART CATH AND CORONARY ANGIOGRAPHY N/A 06/14/2017   Procedure: LEFT HEART CATH AND CORONARY ANGIOGRAPHY;  Surgeon: Court Dorn PARAS, MD;  Location: [facility];  Service: Cardiovascular;  Laterality: N/A;   OOPHORECTOMY  2011   BSO   TOTAL HIP ARTHROPLASTY Right  06/10/2017   Procedure: RIGHT TOTAL HIP ARTHROPLASTY ANTERIOR APPROACH;  Surgeon: Fidel Rogue, MD;  Location: [facility];  Service: Orthopedics;  Laterality: Right;  Needs RNFA   TOTAL HIP ARTHROPLASTY Left 01/27/2024   Procedure: ARTHROPLASTY, HIP, TOTAL, ANTERIOR APPROACH;  Surgeon: Fidel Rogue, MD;  Location: [facility];  Service: Orthopedics;  Laterality: Left;   VAGINAL HYSTERECTOMY  2011   LAVH BSO; benign    FAMILY HISTORY: Family History  Problem Relation Age of Onset   Hypertension Mother    Heart disease Mother    Stroke Mother    Heart disease Father    Heart disease Sister    Diabetes Sister    Uterine cancer Sister        mets to lungs   Lung cancer Sister    Scoliosis Sister    Heart disease Brother    Colon cancer Paternal Aunt 28   Breast cancer Paternal Aunt        Age 46's   Breast cancer Paternal Aunt    Leukemia Paternal Aunt    Lung cancer Paternal Grandfather        smoker   Arthritis Daughter    Breast cancer Cousin        Maternal 1st cousins-Age 72's   Esophageal cancer Neg Hx    Pancreatic cancer Neg Hx    Stomach cancer Neg Hx     SOCIAL HISTORY: Social History   Socioeconomic History   Marital status: Widowed    Spouse name: Not on file   Number of children: 2   Years of education: Not on file   Highest education level: Associate degree: occupational, scientist, product/process development, or vocational program  Occupational History   Occupation: retired  Tobacco Use   Smoking status: Never   Smokeless tobacco: Never  Vaping Use   Vaping status: Never Used  Substance and Sexual Activity   Alcohol  use: No    Comment:  Rare   Drug use: No   Sexual activity: Never    Birth control/protection: Surgical  Other Topics Concern   Not on file  Social History Narrative   Work or School:  none      Home Situation: widow (lives alone )      Spiritual Beliefs: [employer]      Lifestyle: regular exercise (yoga, water  aerobics); diet is healthy      Social Drivers of Health   Tobacco Use: Low Risk (08/25/2024)   Patient History    Smoking Tobacco Use: Never    Smokeless Tobacco Use: Never    Passive Exposure: Not on file  Financial Resource Strain: Low Risk (10/27/2023)   Overall Financial Resource Strain (CARDIA)    Difficulty of Paying Living Expenses: Not hard at all  Food Insecurity: No Food Insecurity (06/28/2024)   Epic    Worried About Radiation Protection Practitioner of Food in the Last Year: Never true    Ran Out of Food in the Last Year: Never true  Transportation Needs: No Transportation Needs (06/28/2024)   Epic    Lack of Transportation (Medical): No    Lack of Transportation (Non-Medical): No  Physical Activity: Sufficiently Active (08/13/2023)   Exercise Vital Sign    Days of Exercise per Week: 3 days    Minutes of Exercise per Session: 60 min  Stress: No Stress Concern Present (08/13/2023)   [employer] of Occupational Health - Occupational Stress Questionnaire    Feeling of Stress : Not at all  Social Connections: Moderately Integrated (06/28/2024)  Social Connection and Isolation Panel    Frequency of Communication with Friends and Family: More than three times a week    Frequency of Social Gatherings with Friends and Family: More than three times a week    Attends Religious Services: More than 4 times per year    Active Member of [employer] or Organizations: Yes    Attends Banker Meetings: More than 4 times per year    Marital Status: Widowed  Intimate Partner Violence: Not At Risk (06/28/2024)   Epic    Fear of Current or Ex-Partner: No    Emotionally Abused: No    Physically  Abused: No    Sexually Abused: No  Depression (PHQ2-9): Low Risk (08/16/2024)   Depression (PHQ2-9)    PHQ-2 Score: 0  Alcohol  Screen: Low Risk (08/13/2023)   Alcohol  Screen    Last Alcohol  Screening Score (AUDIT): 0  Housing: Low Risk (06/28/2024)   Epic    Unable to Pay for Housing in the Last Year: No    Number of Times Moved in the Last Year: 0    Homeless in the Last Year: No  Utilities: Not At Risk (06/28/2024)   Epic    Threatened with loss of utilities: No  Health Literacy: Adequate Health Literacy (08/13/2023)   B1300 Health Literacy    Frequency of need for help with medical instructions: Never     PHYSICAL EXAM  GENERAL EXAM/CONSTITUTIONAL: Vitals:  Vitals:   08/25/24 0832  BP: (!) 112/52  Pulse: (!) 52  Weight: 130 lb (59 kg)  Height: 5' 3 (1.6 m)   Body mass index is 23.03 kg/m. Wt Readings from Last 3 Encounters:  08/25/24 130 lb (59 kg)  08/16/24 130 lb 11.2 oz (59.3 kg)  07/25/24 127 lb 11.2 oz (57.9 kg)   Patient is in no distress; well developed, nourished and groomed; NECK HAS DECR RANGE OF MOTION  CARDIOVASCULAR: Examination of carotid arteries is normal; no carotid bruits Regular rate and rhythm, no murmurs Examination of peripheral vascular system by observation and palpation is normal  EYES: Ophthalmoscopic exam of optic discs and posterior segments is normal; no papilledema or hemorrhages No results found.  MUSCULOSKELETAL: Gait, strength, tone, movements noted in Neurologic exam below  NEUROLOGIC: MENTAL STATUS:     11/30/2017    9:41 AM 08/11/2016    2:07 PM  MMSE - Mini Mental State Exam  Not completed: -- --   awake, alert, oriented to person, place and time recent and remote memory intact normal attention and concentration language fluent, comprehension intact, naming intact fund of knowledge appropriate  CRANIAL NERVE:  2nd - no papilledema on fundoscopic exam 2nd, 3rd, 4th, 6th - pupils equal and reactive to light,  visual fields full to confrontation, extraocular muscles intact, no nystagmus 5th - facial sensation symmetric 7th - facial strength symmetric 8th - hearing intact 9th - palate elevates symmetrically, uvula midline 11th - shoulder shrug symmetric 12th - tongue protrusion midline  MOTOR:  normal bulk and tone, full strength in the BUE, BLE  SENSORY:  normal and symmetric to light touch, pinprick, temperature, vibration; EXCEPT SLIGHT DECR IN RIGHT HAND DIGITS 2-5 TO PP NEG PHALENS  COORDINATION:  finger-nose-finger, fine finger movements normal  REFLEXES:  deep tendon reflexes 1+ and symmetric  GAIT/STATION:  narrow based gait     DIAGNOSTIC DATA (LABS, IMAGING, TESTING) - I reviewed patient records, labs, notes, testing and imaging myself where available.  Lab Results  Component Value Date  WBC 4.6 08/16/2024   HGB 12.9 08/16/2024   HCT 39.2 08/16/2024   MCV 92.2 08/16/2024   PLT 231 08/16/2024      Component Value Date/Time   NA 140 06/28/2024 0951   K 4.3 06/28/2024 0951   CL 108 06/28/2024 0951   CO2 25 06/28/2024 0951   GLUCOSE 120 (H) 06/28/2024 0951   BUN 21 06/28/2024 0951   CREATININE 0.67 06/28/2024 0951   CREATININE 0.68 04/06/2024 1153   CREATININE 0.77 03/04/2020 0828   CALCIUM  8.3 (L) 06/28/2024 0951   CALCIUM  9.0 07/01/2010 2255   PROT 6.4 (L) 06/28/2024 0951   PROT 6.7 02/05/2017 0834   ALBUMIN 4.0 06/28/2024 0951   ALBUMIN 4.4 02/05/2017 0834   AST 21 06/28/2024 0951   AST 18 04/06/2024 1153   ALT 13 06/28/2024 0951   ALT 11 04/06/2024 1153   ALKPHOS 115 06/28/2024 0951   BILITOT 0.3 06/28/2024 0951   BILITOT 0.4 04/06/2024 1153   GFRNONAA >60 06/28/2024 0951   GFRNONAA >60 04/06/2024 1153   GFRAA >60 10/09/2018 0928   Lab Results  Component Value Date   CHOL 127 06/29/2024   HDL 56 06/29/2024   LDLCALC 54 06/29/2024   LDLDIRECT 140.6 05/25/2012   TRIG 85 06/29/2024   CHOLHDL 2.3 06/29/2024   Lab Results  Component Value  Date   HGBA1C 5.3 06/28/2024   Lab Results  Component Value Date   VITAMINB12 236 08/16/2024   Lab Results  Component Value Date   TSH 1.68 10/28/2023    06/28/24 TTE 1. Left ventricular ejection fraction, by estimation, is 60 to 65%. The  left ventricle has normal function. The left ventricle has no regional  wall motion abnormalities. Left ventricular diastolic parameters are  indeterminate.   2. Right ventricular systolic function is normal. The right ventricular  size is normal.   3. The mitral valve is normal in structure. Mild to moderate mitral valve  regurgitation. No evidence of mitral stenosis.   4. Tricuspid valve regurgitation is mild to moderate.   5. The aortic valve is normal in structure. Aortic valve regurgitation is  not visualized. No aortic stenosis is present.   6. The inferior vena cava is normal in size with greater than 50%  respiratory variability, suggesting right atrial pressure of 3 mmHg.   06/28/24 CTA head / neck 1. No acute intracranial abnormality. 2. No large vessel occlusion in the head or neck. 3. Findings suggestive of fibromuscular dysplasia involving the mid and distal cervical internal carotid arteries bilaterally. 4. Mild chronic microvascular ischemic changes.  06/28/24 MRI brain [I reviewed images myself and agree with interpretation. -VRP]  - Unremarkable appearance of the brain for age.   06/29/24 MRI cervical spine [I reviewed images myself and agree with interpretation. -VRP]  1. C6-7: Broad-based disc bulge, eccentric to the right, resulting in mild-to-moderate central spinal canal stenosis and moderate-to-severe right neural foraminal stenosis with likely impingement of the right C7 nerve in the neural foramen. 2. C5-6: Degenerative disc bulging and bilateral uncovertebral joint hypertrophy resulting in mild central spinal canal stenosis and moderate bilateral neuroforaminal stenosis. 3. C4-5: Slight anterolisthesis,  bilateral uncovertebral joint hypertrophy, and bilateral facet arthrosis (more pronounced on the right) resulting in mild central spinal canal stenosis and moderate right neuroforaminal stenosis. 4. C3-4: Disc space narrowing. Patent spinal canal and neural foramina. Fused facet joints.   ASSESSMENT AND PLAN  89 y.o. year old female here with:  Dx:  1. Right arm numbness  PLAN:  RIGHT ARM NUMBNESS / TINGLING (since Aug and Nov 2025; likely related to cervical radiculopathies + intermittent compression in the right arm / elbow / wrist during sleep) - mild sensory loss in right hand fingertips; motor strength is good; recommend conservative mgmt - consider OT evaluation; continue yoga and water  exercises  Return for return to PCP, pending if symptoms worsen or fail to improve.    EDUARD FABIENE HANLON, MD 08/25/2024, 9:42 AM Certified in Neurology, Neurophysiology and Neuroimaging  [facility] [address] [place], [place] [address] [phone]     [1]  Allergies Allergen Reactions   Acyclovir And Related Other (See Comments)    Reaction not recalled   Ciprofloxacin  Other (See Comments)    Dizziness and weakness   Pravachol  [Pravastatin  Sodium] Other (See Comments)    Cystitis   Sulfa  Antibiotics Nausea Only   Zocor  [Simvastatin ] Other (See Comments)    Cystitis   "

## 2024-08-25 NOTE — Patient Instructions (Addendum)
" ° °  RIGHT ARM NUMBNESS / TINGLING (since Aug and Nov 2025; likely related to cervical radiculopathies + intermittent compression in the right arm / elbow / wrist during sleep) - mild sensory loss in right hand fingertips; motor strength is good; recommend conservative mgmt - consider OT evaluation; continue yoga and water  exercises "

## 2024-08-28 ENCOUNTER — Encounter: Payer: Self-pay | Admitting: Oncology

## 2024-08-29 NOTE — Progress Notes (Shared)
 [facility] - Clinic Note  09/12/2024    CHIEF COMPLAINT Patient presents for No chief complaint on file.  HISTORY OF PRESENT ILLNESS: Kaitlyn Parks is a 89 y.o. female who presents to the clinic today for:     Patient feels she is seeing fairly well.  Referring physician: [name] [name], MD [address] [place],  [place] [address]  HISTORICAL INFORMATION:  Selected notes from the MEDICAL RECORD NUMBER Referred by Dr. Jordan DeMarco for concern of exu ARMD LEE: 11.26.19 (J. 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 Every 12 weeks   Brimonidine  Tartrate (LUMIFY ) 0.025 % SOLN Place 1 drop into both eyes every 4 (four) hours as needed (dryness).   No current facility-administered medications for this visit. (Ophthalmic Drugs)   Current Outpatient Medications (Other)  Medication Sig   ADVIL 200 MG CAPS Take 200-400 mg by mouth 2 (two) times daily as needed (for mild pain or muscle soreness).   amLODipine  (NORVASC ) 2.5 MG tablet Take 1 tablet (2.5 mg total) by mouth daily. (Patient taking differently: Take 2.5 mg by mouth daily in the afternoon.)   Ascorbic Acid (VITAMIN C PO) Take 1 tablet by mouth every other day.   aspirin  EC 81 MG tablet Take 81 mg by mouth at bedtime. Swallow whole.   CALCIUM  PO Take 1 tablet by mouth every other day.   CLARITIN 10 MG tablet Take 10 mg by mouth daily as needed (for seasonal allergies).   Cranberry 400 MG CAPS Take 400 mg by mouth in the morning.   Cyanocobalamin  (VITAMIN B12 PO) Take 1 tablet by mouth every other day.   FERROUS SULFATE PO Take 1 tablet by mouth every other day.   hydrALAZINE  (APRESOLINE ) 10 MG tablet TAKE (1) TABLET TWICE A DAY. MAY TAKE EXTRA  DOSE FOR SBP >160 UP TO A TOTAL OF 4 TIMES A DAY AS NEEDED (Patient taking differently: Take 10 mg by mouth 4 (four) times daily as needed (for a Systolic number greater than 160).)   isosorbide  mononitrate (IMDUR ) 60 MG 24 hr tablet TAKE 1 & 1/2 TABLETS by mouth once A DAY. (Patient taking differently: Take 90 mg by mouth at bedtime.)   Lidocaine -Menthol  3.5-7 % PTCH Place 1 patch onto the skin 2 (two) times daily as needed (knee/hip pain.).   Multiple Vitamins-Calcium  (ONE-A-DAY WOMENS PO) Take 1 tablet by mouth daily with lunch.   nitroGLYCERIN  (NITROSTAT ) 0.4 MG SL tablet DISSOLVE 1 TABLET UNDER TONGUE IF NEEDED FOR CHEST PAIN. MAY REPEAT IN 5 MINUTES FOR 3 DOSES. (Patient taking differently: Place 0.4 mg under the tongue every 5 (five) minutes x 3 doses as needed for chest pain.)   OCEAN NASAL SPRAY 0.65 % nasal spray Place 1 spray into the nose See admin instructions. Instill 1 spray into each nostil in the morning and as needed for nasal congestion   ondansetron  (ZOFRAN -ODT) 4 MG disintegrating tablet Take 4 mg by mouth every 8 (eight) hours as needed for nausea or vomiting (dissolve orally).   pantoprazole  (PROTONIX ) 40 MG tablet Take 1 tablet (40 mg total) by mouth daily.   rosuvastatin  (CRESTOR ) 10 MG tablet TABLET ONE-HALF TABLET BY MOUTH DAILY (Patient taking differently: Take 5 mg by mouth every evening.)   SYNTHROID  50 MCG tablet  Take 1 tablet (50 mcg total) by mouth daily before breakfast.   tiZANidine  (ZANAFLEX ) 4 MG tablet Take 1 tablet (4 mg total) by mouth every 6 (six) hours as needed for muscle spasms.   traMADol  (ULTRAM ) 50 MG tablet Take 50 mg by mouth every 8 (eight) hours as needed for moderate pain (pain score 4-6).   Current Facility-Administered Medications (Other)  Medication Route   denosumab  (PROLIA ) injection 60 mg Subcutaneous   [START ON 12/21/2024] denosumab  (PROLIA ) injection 60 mg Subcutaneous   REVIEW OF SYSTEMS:    ALLERGIES Allergies  Allergen Reactions    Acyclovir And Related Other (See Comments)    Reaction not recalled   Ciprofloxacin  Other (See Comments)    Dizziness and weakness   Pravachol  [Pravastatin  Sodium] Other (See Comments)    Cystitis   Sulfa  Antibiotics Nausea Only   Zocor  [Simvastatin ] Other (See Comments)    Cystitis   PAST MEDICAL HISTORY Past Medical History:  Diagnosis Date   Arthritis    DDD, scoliosis, sees Dr. Mavis for this, uses norco very rarely for pain   Arthritis    lumbar stenosis, gets steroid injections for [facility]   Bradycardia 11/11/2017   CAD (coronary artery disease)    LAD stenting of a 90% lesion 2012   Cystocele    Educated about COVID-19 virus infection 12/06/2019   Elevated cholesterol    GERD (gastroesophageal reflux disease)    dx in work up 2016 for atypical CP at OSH   pt. denies   Heart murmur    Hypertensive retinopathy    OU   Hypothyroidism    Interstitial cystitis    sees Dr. Watt   Macular degeneration    OU   Neuromuscular disorder [facility])    neuropathy   NSTEMI (non-ST elevated myocardial infarction) (HCC)    Osteoporosis    Rectocele    S/P hip replacement, right 06/12/2017   Scoliosis    Thyroid  disease    Hypothyroid   Urinary incontinence    USI   Uterine prolapse    Past Surgical History:  Procedure Laterality Date   BLADDER SUSPENSION  2011   CATARACT EXTRACTION Bilateral 2015   Dr. Cleatus   CORONARY ANGIOPLASTY WITH STENT PLACEMENT  04/21/2010   LAD 80%, RCA 30%, nl EF, s/p DES LAD   EYE SURGERY     LEFT HEART CATH AND CORONARY ANGIOGRAPHY N/A 06/14/2017   Procedure: LEFT HEART CATH AND CORONARY ANGIOGRAPHY;  Surgeon: Court Dorn PARAS, MD;  Location: [facility] INVASIVE [facility];  Service: Cardiovascular;  Laterality: N/A;   OOPHORECTOMY  2011   BSO   TOTAL HIP ARTHROPLASTY Right 06/10/2017   Procedure: RIGHT TOTAL HIP ARTHROPLASTY ANTERIOR APPROACH;  Surgeon: Fidel Rogue, MD;  Location: [facility] ORS;  Service: Orthopedics;  Laterality: Right;  Needs  RNFA   TOTAL HIP ARTHROPLASTY Left 01/27/2024   Procedure: ARTHROPLASTY, HIP, TOTAL, ANTERIOR APPROACH;  Surgeon: Fidel Rogue, MD;  Location: [facility] OR[facility];  Service: Orthopedics;  Laterality: Left;   VAGINAL HYSTERECTOMY  2011   LAVH BSO; benign   FAMILY HISTORY Family History  Problem Relation Age of Onset   Hypertension Mother    Heart disease Mother    Stroke Mother    Heart disease Father    Heart disease Sister    Diabetes Sister    Uterine cancer Sister        mets to lungs   Lung cancer Sister    Scoliosis Sister    Heart disease  Brother    Colon cancer Paternal Aunt 64   Breast cancer Paternal Aunt        Age 60's   Breast cancer Paternal Aunt    Leukemia Paternal Aunt    Lung cancer Paternal Grandfather        smoker   Arthritis Daughter    Breast cancer Cousin        Maternal 1st cousins-Age 84's   Esophageal cancer Neg Hx    Pancreatic cancer Neg Hx    Stomach cancer Neg Hx    SOCIAL HISTORY Social History   Tobacco Use   Smoking status: Never   Smokeless tobacco: Never  Vaping Use   Vaping status: Never Used  Substance Use Topics   Alcohol  use: No    Comment: Rare   Drug use: No       OPHTHALMIC EXAM: Not recorded    IMAGING AND PROCEDURES  Imaging and Procedures for @TODAY @           ASSESSMENT/PLAN:  No diagnosis found.  1. Exudative age-related macular degeneration, left eye  - OCT 4.30.2020 showed interval conversion of OS from nonexudative ARMD to exu-ARMD with large dome-shaped PED with overlying IRF/SRF  - FA 05.28.20 -- no active CNV OS, just staining - s/p IVA OS #1 (04.30.20), #2 (05.28.20), #3 (06.26.20), #4 (08.03.20), #5 (09.11.20), #6 (10.19.20), #7 (11.23.20) - reactivation of CNV noted on 05.05.22 -- s/p IVA OS  #8 (05.05.22), #9 (06.06.22), #10 (07.19.22), #11 (09.13.22), #12 (10.25.22), #13 (12.13.22), #14 (01.30.23), #15 (03.20.23), #16 (05.08.23), #17 (06.19.23), #18 (08.07.23), #19 (10.02.23), #20 (11.20.23), #21  (01.15.24), #22 (12.19.24), #23 (02.21.25) ================== - s/p IVE OS #1 (03.11.24), #2 (04.22.24), #3 (05.29.24), #4 (07.22.24), #5 (09.09.24), #6 (10.28.24), #7 (12.19.24), #8 (04.18.25), #9 (06.02.25), #10 (07.21.25), #11 (09.15.25), #12 (11.19.25) - **Interval increase in IRF overlying PED at 8 wks on 09.13.22 visit (IVA)**  - BCVA OS 20/30 - stable - OCT shows Stable improvement in IRF/cystic changes inferior fovea, stable improvement in [facility] / PED inferior to fovea at 9+ weeks - recommend IVE today OS #13 (02.10.26) with f/u ext to 12 weeks.  - pt wishes to proceed with injection  - RBA of procedure discussed, questions answered - see procedure note - IVE informed consent obtained and signed, 11.19.25 - benefits investigation for Eylea  initiated 4.30.2020 -- approved for 2025 but pt covering 20% coinsurance on medications  - f/u in 12 weeks -- DFE/OCT, possible injection  2. Age related macular degeneration, exudative, OD - interval development of IRF first noted on 09.11.20 -- conversion from nonexudative to exudative ARMD - pt initially presented on 11.27.19 due to alert from [facility] home monitoring system for OD  - [facility] prescribed by Dr. Cleatus - FA (5.28.2020) showed staining / window defect corresponding temporal RPE changes OU -- no active CNV OU - S/P IVA OD #1 (09.11.20), #2 (10.19.20), #3 (11.23.20), #4 (01.07.21), #5 (2.19.21), #6 (04.02.21), #7 (05.28.21), #8 (07.23.21), #9 (09.24.21), #10 (11.29.21), #11 (02.11.22), #12 (05.05.22), #13 (7.19.22), #14 (09.13.22), #15 (10.02.23), #16 (11.20.23), #17 (01.15.24), #18 (03.11.24) - OCT shows stable improvement in cystic changes nasal and temporal fovea, patchy ORA / GA -- no fluid  - BCVA OD 20/25 from 20/30 (20+ mos since last injxn)  - recommend holding IVA OD again today -- will treat PRN - IVA informed consent obtained and signed, 05.08.23 (OU) - see procedure note  - f/u in 12 weeks DFE, OCT  3,4. Hypertensive  retinopathy OU  - discussed  importance of tight BP control  - monitor  5. Pseudophakia OU  - s/p CE/IOL OU  - beautiful surgeries, doing well  - monitor  6. Dry eyes OU  - recommend artificial tears and lubricating ointment as needed  - using Miebo and Lumify  per Dr. Vivian  7. 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    No follow-ups on file.  There are no Patient Instructions on file for this visit.  This document serves as a record of services personally performed by Redell JUDITHANN Hans, MD, PhD. It was created on their behalf by Wanda GEANNIE Keens, COT an ophthalmic technician. The creation of this record is the provider's dictation and/or activities during the visit.    Electronically signed by:  Wanda GEANNIE Keens, COT  08/29/24 9:58 AM  Redell JUDITHANN Hans, M.D., Ph.D. Diseases & Surgery of the Retina and Vitreous [facility]   Abbreviations: M myopia (nearsighted); A astigmatism; H hyperopia (farsighted); P presbyopia; Mrx spectacle prescription;  CTL contact lenses; OD right eye; OS left eye; OU both eyes  XT exotropia; ET esotropia; PEK punctate epithelial keratitis; PEE punctate epithelial erosions; DES dry eye syndrome; MGD meibomian gland dysfunction; ATs artificial tears; PFAT's preservative free artificial tears; NSC nuclear sclerotic cataract; PSC posterior subcapsular cataract; ERM epi-retinal membrane; PVD posterior vitreous detachment; RD retinal detachment; DM diabetes mellitus; DR diabetic retinopathy; NPDR non-proliferative diabetic retinopathy; PDR proliferative diabetic retinopathy; CSME clinically significant macular edema; DME diabetic macular edema; dbh dot blot hemorrhages; CWS cotton wool spot; POAG primary open angle glaucoma; C/D cup-to-disc ratio; HVF humphrey visual field; GVF goldmann  visual field; OCT optical coherence tomography; IOP intraocular pressure; BRVO Branch retinal vein occlusion; CRVO central retinal vein occlusion; CRAO central retinal artery occlusion; BRAO branch retinal artery occlusion; RT retinal tear; SB scleral buckle; PPV pars plana vitrectomy; VH Vitreous hemorrhage; PRP panretinal laser photocoagulation; IVK intravitreal kenalog ; VMT vitreomacular traction; MH Macular hole;  NVD neovascularization of the disc; NVE neovascularization elsewhere; AREDS age related eye disease study; ARMD age related macular degeneration; POAG primary open angle glaucoma; EBMD epithelial/anterior basement membrane dystrophy; ACIOL anterior chamber intraocular lens; IOL intraocular lens; PCIOL posterior chamber intraocular lens; Phaco/IOL phacoemulsification with intraocular lens placement; PRK photorefractive keratectomy; LASIK laser assisted in situ keratomileusis; HTN hypertension; DM diabetes mellitus; COPD chronic obstructive pulmonary disease

## 2024-09-12 ENCOUNTER — Encounter (INDEPENDENT_AMBULATORY_CARE_PROVIDER_SITE_OTHER): Admitting: Ophthalmology

## 2024-09-12 DIAGNOSIS — H353211 Exudative age-related macular degeneration, right eye, with active choroidal neovascularization: Secondary | ICD-10-CM

## 2024-09-12 DIAGNOSIS — I10 Essential (primary) hypertension: Secondary | ICD-10-CM

## 2024-09-12 DIAGNOSIS — Z961 Presence of intraocular lens: Secondary | ICD-10-CM

## 2024-09-12 DIAGNOSIS — H353221 Exudative age-related macular degeneration, left eye, with active choroidal neovascularization: Secondary | ICD-10-CM

## 2024-09-12 DIAGNOSIS — H35033 Hypertensive retinopathy, bilateral: Secondary | ICD-10-CM

## 2024-09-12 DIAGNOSIS — H04123 Dry eye syndrome of bilateral lacrimal glands: Secondary | ICD-10-CM

## 2024-10-20 ENCOUNTER — Ambulatory Visit: Admitting: Family Medicine

## 2024-11-02 ENCOUNTER — Ambulatory Visit

## 2024-11-27 ENCOUNTER — Ambulatory Visit: Admitting: Endocrinology
